# Patient Record
Sex: Female | Born: 1965
Health system: Southern US, Community
[De-identification: ages and names within clinical notes are randomized; demographics above are authoritative.]

## PROBLEM LIST (undated history)

## (undated) DIAGNOSIS — Z8669 Personal history of other diseases of the nervous system and sense organs: Secondary | ICD-10-CM

## (undated) DIAGNOSIS — N760 Acute vaginitis: Secondary | ICD-10-CM

## (undated) DIAGNOSIS — E0590 Thyrotoxicosis, unspecified without thyrotoxic crisis or storm: Secondary | ICD-10-CM

## (undated) DIAGNOSIS — R0689 Other abnormalities of breathing: Secondary | ICD-10-CM

## (undated) DIAGNOSIS — K219 Gastro-esophageal reflux disease without esophagitis: Secondary | ICD-10-CM

## (undated) DIAGNOSIS — D649 Anemia, unspecified: Secondary | ICD-10-CM

## (undated) DIAGNOSIS — Z9109 Other allergy status, other than to drugs and biological substances: Secondary | ICD-10-CM

## (undated) DIAGNOSIS — N644 Mastodynia: Secondary | ICD-10-CM

## (undated) DIAGNOSIS — F419 Anxiety disorder, unspecified: Secondary | ICD-10-CM

## (undated) DIAGNOSIS — K449 Diaphragmatic hernia without obstruction or gangrene: Secondary | ICD-10-CM

## (undated) HISTORY — DX: Other abnormalities of breathing: R06.89

## (undated) HISTORY — DX: Gastro-esophageal reflux disease without esophagitis: K21.9

## (undated) HISTORY — DX: Acute vaginitis: N76.0

## (undated) HISTORY — DX: Anxiety disorder, unspecified: F41.9

## (undated) HISTORY — DX: Other allergy status, other than to drugs and biological substances: Z91.09

## (undated) HISTORY — DX: Anemia, unspecified: D64.9

## (undated) HISTORY — DX: Thyrotoxicosis, unspecified without thyrotoxic crisis or storm: E05.90

## (undated) HISTORY — DX: Mastodynia: N64.4

## (undated) HISTORY — DX: Personal history of other diseases of the nervous system and sense organs: Z86.69

## (undated) HISTORY — DX: Diaphragmatic hernia without obstruction or gangrene: K44.9

## (undated) HISTORY — PX: TOOTH EXTRACTION: SUR596

---

## 1988-05-27 HISTORY — PX: SEPTOPLASTY: SUR1290

## 1991-11-28 HISTORY — PX: NASAL SINUS SURGERY: SHX719

## 1998-02-27 HISTORY — PX: UMBILICAL HERNIA REPAIR: SHX196

## 1998-02-27 HISTORY — PX: TUBAL LIGATION: SHX77

## 1998-03-23 ENCOUNTER — Ambulatory Visit (HOSPITAL_COMMUNITY): Admission: RE | Admit: 1998-03-23 | Discharge: 1998-03-23 | Payer: Self-pay | Admitting: *Deleted

## 1998-11-30 ENCOUNTER — Other Ambulatory Visit: Admission: RE | Admit: 1998-11-30 | Discharge: 1998-11-30 | Payer: Self-pay | Admitting: *Deleted

## 1999-12-09 ENCOUNTER — Other Ambulatory Visit: Admission: RE | Admit: 1999-12-09 | Discharge: 1999-12-09 | Payer: Self-pay | Admitting: *Deleted

## 2003-11-20 ENCOUNTER — Ambulatory Visit: Payer: Self-pay | Admitting: Internal Medicine

## 2003-11-24 ENCOUNTER — Ambulatory Visit: Payer: Self-pay | Admitting: Internal Medicine

## 2003-11-28 HISTORY — PX: CHOLECYSTECTOMY: SHX55

## 2003-12-18 ENCOUNTER — Ambulatory Visit: Payer: Self-pay | Admitting: Surgery

## 2004-01-25 ENCOUNTER — Ambulatory Visit: Payer: Self-pay | Admitting: Surgery

## 2004-02-07 ENCOUNTER — Ambulatory Visit: Payer: Self-pay | Admitting: Surgery

## 2004-03-06 ENCOUNTER — Ambulatory Visit: Payer: Self-pay | Admitting: Internal Medicine

## 2004-03-21 ENCOUNTER — Ambulatory Visit: Payer: Self-pay | Admitting: Gastroenterology

## 2005-03-06 ENCOUNTER — Ambulatory Visit: Payer: Self-pay | Admitting: Internal Medicine

## 2006-04-22 ENCOUNTER — Ambulatory Visit: Payer: Self-pay | Admitting: Internal Medicine

## 2006-07-14 ENCOUNTER — Ambulatory Visit: Payer: Self-pay | Admitting: Gastroenterology

## 2006-08-26 ENCOUNTER — Ambulatory Visit: Payer: Self-pay | Admitting: Internal Medicine

## 2007-04-29 ENCOUNTER — Ambulatory Visit: Payer: Self-pay | Admitting: Internal Medicine

## 2008-05-02 ENCOUNTER — Ambulatory Visit: Payer: Self-pay | Admitting: Internal Medicine

## 2008-06-19 ENCOUNTER — Ambulatory Visit: Payer: Self-pay | Admitting: Internal Medicine

## 2009-01-13 ENCOUNTER — Ambulatory Visit: Payer: Self-pay

## 2009-01-29 ENCOUNTER — Ambulatory Visit: Payer: Self-pay

## 2009-05-07 ENCOUNTER — Ambulatory Visit: Payer: Self-pay | Admitting: Internal Medicine

## 2009-12-03 ENCOUNTER — Ambulatory Visit: Payer: Self-pay | Admitting: Internal Medicine

## 2010-04-10 ENCOUNTER — Ambulatory Visit: Payer: Self-pay | Admitting: Pain Medicine

## 2010-04-18 ENCOUNTER — Ambulatory Visit: Payer: Self-pay | Admitting: Pain Medicine

## 2010-04-22 ENCOUNTER — Ambulatory Visit: Payer: Self-pay | Admitting: Pain Medicine

## 2010-04-25 ENCOUNTER — Ambulatory Visit: Payer: Self-pay | Admitting: Pain Medicine

## 2010-05-13 ENCOUNTER — Ambulatory Visit: Payer: Self-pay | Admitting: Internal Medicine

## 2010-05-13 ENCOUNTER — Ambulatory Visit: Payer: Self-pay | Admitting: Pain Medicine

## 2010-05-21 ENCOUNTER — Ambulatory Visit: Payer: Self-pay | Admitting: Pain Medicine

## 2010-06-03 ENCOUNTER — Ambulatory Visit: Payer: Self-pay | Admitting: Pain Medicine

## 2010-06-04 ENCOUNTER — Ambulatory Visit: Payer: Self-pay | Admitting: Pain Medicine

## 2010-06-17 ENCOUNTER — Ambulatory Visit: Payer: Self-pay | Admitting: Pain Medicine

## 2010-10-03 ENCOUNTER — Ambulatory Visit: Payer: Self-pay | Admitting: Pain Medicine

## 2010-10-23 ENCOUNTER — Ambulatory Visit: Payer: Self-pay | Admitting: Pain Medicine

## 2010-10-31 ENCOUNTER — Ambulatory Visit: Payer: Self-pay | Admitting: Pain Medicine

## 2010-11-18 ENCOUNTER — Ambulatory Visit: Payer: Self-pay | Admitting: Pain Medicine

## 2010-12-12 ENCOUNTER — Ambulatory Visit: Payer: Self-pay | Admitting: Pain Medicine

## 2010-12-30 ENCOUNTER — Ambulatory Visit: Payer: Self-pay | Admitting: Pain Medicine

## 2011-01-02 ENCOUNTER — Ambulatory Visit: Payer: Self-pay | Admitting: Pain Medicine

## 2011-01-15 ENCOUNTER — Ambulatory Visit: Payer: Self-pay | Admitting: Pain Medicine

## 2011-01-30 LAB — HM PAP SMEAR

## 2011-04-30 LAB — HM MAMMOGRAPHY

## 2011-05-05 ENCOUNTER — Ambulatory Visit: Payer: Self-pay | Admitting: Pain Medicine

## 2011-05-08 ENCOUNTER — Ambulatory Visit: Payer: Self-pay | Admitting: Pain Medicine

## 2011-05-28 ENCOUNTER — Ambulatory Visit: Payer: Self-pay | Admitting: Pain Medicine

## 2011-05-29 ENCOUNTER — Ambulatory Visit: Payer: Self-pay | Admitting: Pain Medicine

## 2011-06-17 ENCOUNTER — Ambulatory Visit: Payer: Self-pay | Admitting: Pain Medicine

## 2011-06-17 ENCOUNTER — Ambulatory Visit: Payer: Self-pay | Admitting: Internal Medicine

## 2011-06-26 ENCOUNTER — Ambulatory Visit: Payer: Self-pay | Admitting: Pain Medicine

## 2011-07-15 ENCOUNTER — Ambulatory Visit: Payer: Self-pay | Admitting: Pain Medicine

## 2012-01-30 ENCOUNTER — Encounter: Payer: Self-pay | Admitting: Internal Medicine

## 2012-01-30 ENCOUNTER — Ambulatory Visit (INDEPENDENT_AMBULATORY_CARE_PROVIDER_SITE_OTHER): Payer: BC Managed Care – PPO | Admitting: Internal Medicine

## 2012-01-30 VITALS — BP 120/70 | HR 80 | Temp 98.6°F | Ht 64.0 in | Wt 133.2 lb

## 2012-01-30 DIAGNOSIS — K219 Gastro-esophageal reflux disease without esophagitis: Secondary | ICD-10-CM | POA: Insufficient documentation

## 2012-01-30 DIAGNOSIS — R5381 Other malaise: Secondary | ICD-10-CM

## 2012-01-30 DIAGNOSIS — D649 Anemia, unspecified: Secondary | ICD-10-CM

## 2012-01-30 DIAGNOSIS — E0590 Thyrotoxicosis, unspecified without thyrotoxic crisis or storm: Secondary | ICD-10-CM

## 2012-01-30 DIAGNOSIS — Z139 Encounter for screening, unspecified: Secondary | ICD-10-CM

## 2012-01-30 DIAGNOSIS — R5383 Other fatigue: Secondary | ICD-10-CM

## 2012-01-30 MED ORDER — ALPRAZOLAM 0.25 MG PO TABS: 0.2500 mg | ORAL_TABLET | Freq: Every evening | ORAL | Status: DC | PRN

## 2012-01-30 MED ORDER — TANDEM PLUS 162-115.2-1 MG PO CAPS: 1.0000 | ORAL_CAPSULE | Freq: Every day | ORAL | Status: DC

## 2012-02-01 ENCOUNTER — Encounter: Payer: Self-pay | Admitting: Internal Medicine

## 2012-02-01 DIAGNOSIS — D649 Anemia, unspecified: Secondary | ICD-10-CM | POA: Insufficient documentation

## 2012-02-01 NOTE — Assessment & Plan Note (Signed)
On Omeprazole.  Symptoms controlled.    

## 2012-02-01 NOTE — Progress Notes (Signed)
  Subjective:    Patient ID: Shannon [name], female    DOB: 01-26-1966, 47 y.o.   MRN: [id number]  HPI 47 year old female with past history of reoccurring allergy and sinus problems as well as hyperthyroidism s/p ablation who comes in today for a scheduled follow up.  She states she saw Dr Feliberto Gottron.  Was started on Combipatch.  Has had three periods since Thanksgiving.  Has felt better on the patch.  Mood swings better. Has been a little more emotional.  Headaches better.  She is scheduled to be seen in the headache clinic soon.  Sees them yearly.  She did see Dr Tedd Sias in 10/13.  States her thyroid checked out fine.  Wants to start getting it checked here.  Blood pressure has been doing well.    Past Medical History  Diagnosis Date  . Hyperthyroidism     s/p ablation  . GERD (gastroesophageal reflux disease)   . History of migraine headaches   . Anemia   . Environmental allergies   . Hiatal hernia     small    Current Outpatient Prescriptions on File Prior to Visit  Medication Sig Dispense Refill  . estradiol-norethindrone (COMBIPATCH) 0.05-0.14 MG/DAY Place 1 patch onto the skin 2 (two) times a week.      Marland Kitchen omeprazole (PRILOSEC) 20 MG capsule Take 20 mg by mouth daily.        Review of Systems Patient denies any lightheadedness or dizziness.  Headaches better.  No chest pain, tightness or palpitations.  No increased shortness of breath, cough or congestion.  No acid reflux.  Controlled.  No nausea or vomiting.  No abdominal pain or cramping.  No bowel change, such as diarrhea, constipation, BRBPR or melana.  No urine change.  Periods as outlined.       Objective:   Physical Exam Filed Vitals:   01/30/12 0825  BP: 120/70  Pulse: 80  Temp: 98.6 F (58 C)   47 year old female in no acute distress.   HEENT:  Nares - clear.  OP- without lesions or erythema.  NECK:  Supple, nontender.  No audible bruit.   HEART:  Appears to be regular. LUNGS:  Without crackles or  wheezing audible.  Respirations even and unlabored.   RADIAL PULSE:  Equal bilaterally.  ABDOMEN:  Soft, nontender.  No audible abdominal bruit.   EXTREMITIES:  No increased edema to be present.                  Assessment & Plan:  GYN.  States she is up to date with her pelvic and pap smears.  On Combi patch now.  Plans to discuss the menstrual change with GYN.    CARDIOVASCULAR.  ECHO 12/05/09 revealed EF 60% with no significant valve disease.  Currently asymptomatic.   ELEVATED BLOOD PRESSURE.  Previously noted.  Looks good today.  Follow.   INCREASED ANXIETY.  Takes xanax (1/2 tablet q hs).  Follow.    HEALTH MAINTENANCE.  GYN exams through GYN.  Colonoscopy 02/26/11 normal.  Recommended follow up colonoscopy 3-5 years.  Mammogram 06/17/11 - BiRADS II.

## 2012-02-01 NOTE — Assessment & Plan Note (Signed)
On iron supplements.  Check cbc and ferritin with next labs.

## 2012-02-01 NOTE — Assessment & Plan Note (Signed)
S/p ablation.  Follow tsh.  Obtain records from Dr Tedd Sias.

## 2012-02-23 ENCOUNTER — Ambulatory Visit: Payer: Self-pay | Admitting: Pain Medicine

## 2012-03-02 ENCOUNTER — Ambulatory Visit: Payer: Self-pay | Admitting: Pain Medicine

## 2012-03-17 ENCOUNTER — Ambulatory Visit: Payer: Self-pay | Admitting: Pain Medicine

## 2012-03-23 ENCOUNTER — Ambulatory Visit: Payer: Self-pay | Admitting: Pain Medicine

## 2012-04-07 ENCOUNTER — Ambulatory Visit: Payer: Self-pay | Admitting: Pain Medicine

## 2012-05-04 ENCOUNTER — Ambulatory Visit: Payer: Self-pay | Admitting: Pain Medicine

## 2012-05-13 ENCOUNTER — Other Ambulatory Visit (INDEPENDENT_AMBULATORY_CARE_PROVIDER_SITE_OTHER): Payer: BC Managed Care – PPO

## 2012-05-13 DIAGNOSIS — Z139 Encounter for screening, unspecified: Secondary | ICD-10-CM

## 2012-05-13 DIAGNOSIS — R5383 Other fatigue: Secondary | ICD-10-CM

## 2012-05-13 DIAGNOSIS — D649 Anemia, unspecified: Secondary | ICD-10-CM

## 2012-05-13 DIAGNOSIS — K219 Gastro-esophageal reflux disease without esophagitis: Secondary | ICD-10-CM

## 2012-05-13 DIAGNOSIS — R5381 Other malaise: Secondary | ICD-10-CM

## 2012-05-13 DIAGNOSIS — E0590 Thyrotoxicosis, unspecified without thyrotoxic crisis or storm: Secondary | ICD-10-CM

## 2012-05-13 LAB — CBC WITH DIFFERENTIAL/PLATELET
Basophils Relative: 0.5 % (ref 0.0–3.0)
Eosinophils Relative: 0.6 % (ref 0.0–5.0)
Lymphocytes Relative: 20.3 % (ref 12.0–46.0)
MCV: 91.7 fl (ref 78.0–100.0)
Monocytes Relative: 8.3 % (ref 3.0–12.0)
Neutrophils Relative %: 70.3 % (ref 43.0–77.0)
Platelets: 277 10*3/uL (ref 150.0–400.0)
RBC: 4.42 Mil/uL (ref 3.87–5.11)
WBC: 8.7 10*3/uL (ref 4.5–10.5)

## 2012-05-13 LAB — FERRITIN: Ferritin: 185.1 ng/mL (ref 10.0–291.0)

## 2012-05-13 LAB — LIPID PANEL
Cholesterol: 174 mg/dL (ref 0–200)
HDL: 57.4 mg/dL (ref >39.00)
LDL Cholesterol: 104 mg/dL — ABNORMAL HIGH (ref 0–99)
Total CHOL/HDL Ratio: 3
Triglycerides: 62 mg/dL (ref 0.0–149.0)

## 2012-05-13 LAB — COMPREHENSIVE METABOLIC PANEL
ALT: 16 U/L (ref 0–35)
AST: 21 U/L (ref 0–37)
Creatinine, Ser: 0.8 mg/dL (ref 0.4–1.2)
Total Bilirubin: 0.8 mg/dL (ref 0.3–1.2)

## 2012-05-20 ENCOUNTER — Encounter: Payer: Self-pay | Admitting: Internal Medicine

## 2012-05-20 ENCOUNTER — Ambulatory Visit (INDEPENDENT_AMBULATORY_CARE_PROVIDER_SITE_OTHER): Payer: BC Managed Care – PPO | Admitting: Internal Medicine

## 2012-05-20 VITALS — BP 110/70 | HR 85 | Temp 99.0°F | Ht 63.25 in | Wt 130.8 lb

## 2012-05-20 DIAGNOSIS — G43909 Migraine, unspecified, not intractable, without status migrainosus: Secondary | ICD-10-CM

## 2012-05-20 DIAGNOSIS — E0590 Thyrotoxicosis, unspecified without thyrotoxic crisis or storm: Secondary | ICD-10-CM

## 2012-05-20 DIAGNOSIS — Z1239 Encounter for other screening for malignant neoplasm of breast: Secondary | ICD-10-CM

## 2012-05-20 DIAGNOSIS — K219 Gastro-esophageal reflux disease without esophagitis: Secondary | ICD-10-CM

## 2012-05-20 DIAGNOSIS — D649 Anemia, unspecified: Secondary | ICD-10-CM

## 2012-05-20 MED ORDER — ALPRAZOLAM 0.25 MG PO TABS: 0.2500 mg | ORAL_TABLET | Freq: Every evening | ORAL | Status: DC | PRN

## 2012-05-21 ENCOUNTER — Encounter: Payer: Self-pay | Admitting: Internal Medicine

## 2012-05-21 DIAGNOSIS — G43909 Migraine, unspecified, not intractable, without status migrainosus: Secondary | ICD-10-CM | POA: Insufficient documentation

## 2012-05-21 NOTE — Progress Notes (Signed)
Subjective:    Patient ID: Shannon [name], female    DOB: 1965/07/23, 47 y.o.   MRN: [id number]  HPI 47 year old female with past history of reoccurring allergy and sinus problems as well as hyperthyroidism s/p ablation who comes in today to follow up on these issues as well as for a complete physical exam.  She states she saw Dr Feliberto Gottron.  Is on Combipatch.  Periods persistent and occasionally lasting 9-13 days.  (11/4 - 9 days, 11/30 - 5 days, 12/9 - 6 days, 12/19 - 10 days, 1/18 - 5 days, 2/20 -  5days, 3/10 - 13 days, 3/25 - 9 days.  Started her last period 05/18/12.  Still having some hot flashes, but seem to be some better.  Still some migraines.  [facility]   She did see Dr Tedd Sias in 10/13.  States her thyroid checked out fine.  Wants to start getting it checked here.  Recent thyroid function wnl.  Blood pressure has been doing well.     Past Medical History  Diagnosis Date  . Hyperthyroidism     s/p ablation  . GERD (gastroesophageal reflux disease)   . History of migraine headaches   . Anemia   . Environmental allergies   . Hiatal hernia     small    Current Outpatient Prescriptions on File Prior to Visit  Medication Sig Dispense Refill  . estradiol-norethindrone (COMBIPATCH) 0.05-0.14 MG/DAY Place 1 patch onto the skin 2 (two) times a week.      . FeFum-FePo-FA-B Cmp-C-Zn-Mn-Cu (TANDEM PLUS) 162-115.2-1 MG CAPS Take 1 capsule by mouth daily.  30 each  6  . omeprazole (PRILOSEC) 20 MG capsule Take 20 mg by mouth daily.       No current facility-administered medications on file prior to visit.    Review of Systems Patient denies any lightheadedness or dizziness.  Headaches better.  Still some migraines.  No chest pain, tightness or palpitations.  No increased shortness of breath, cough or congestion.  No acid reflux.  Controlled.  No nausea or vomiting.  No abdominal pain or cramping.  No bowel change, such as diarrhea, constipation, BRBPR or  melana.  No urine change.  Periods as outlined.  Some hot flashes.  Some better.  Back better since recent injection.       Objective:   Physical Exam  Filed Vitals:   05/20/12 1043  BP: 110/70  Pulse: 85  Temp: 99 F (37.2 C)   Blood pressure recheck:  122/80, pulse 18  47 year old female in no acute distress.   HEENT:  Nares- clear.  Oropharynx - without lesions. NECK:  Supple.  Nontender.  No audible bruit.  HEART:  Appears to be regular. LUNGS:  No crackles or wheezing audible.  Respirations even and unlabored.  RADIAL PULSE:  Equal bilaterally.    BREASTS:  No nipple discharge or nipple retraction present.  Could not appreciate any distinct nodules or axillary adenopathy.  ABDOMEN:  Soft, nontender.  Bowel sounds present and normal.  No audible abdominal bruit.  GU:  Unable to do secondary to her having her period.  EXTREMITIES:  No increased edema present.  DP pulses palpable and equal bilaterally.          Assessment & Plan:  GYN.  Last pelvic/pap 1/13.  Unable to do today secondary to her having her period.  On Combi patch now.  Discussed the need to follow up with GYN regarding the  persistent increased periods.  Will get her back in soon to perform her pelvic/pap.    CARDIOVASCULAR.  ECHO 12/05/09 revealed EF 60% with no significant valve disease.  Currently asymptomatic.   ELEVATED BLOOD PRESSURE.  Previously noted.  Looks good today.  Follow.   INCREASED ANXIETY.  Takes xanax (1/2 tablet q hs).  Follow.    HEALTH MAINTENANCE.  Physical today.  Get her back in soon for her pelvic/pap.  Colonoscopy 02/26/11 normal.  Recommended follow up colonoscopy 3-5 years.  Mammogram 06/17/11 - BiRADS II. Schedule a follow up mammogram.

## 2012-05-21 NOTE — Assessment & Plan Note (Signed)
On iron supplements.  Taking every other day now.  Recent hgb and ferritin wnl.

## 2012-05-21 NOTE — Assessment & Plan Note (Signed)
On Omeprazole.  Symptoms controlled.    

## 2012-05-21 NOTE — Assessment & Plan Note (Signed)
Still with intermittent headaches.  [facility]

## 2012-05-21 NOTE — Assessment & Plan Note (Signed)
S/p ablation.  Follow tsh.  Recent check wnl.

## 2012-05-26 ENCOUNTER — Ambulatory Visit: Payer: Self-pay | Admitting: Pain Medicine

## 2012-05-27 ENCOUNTER — Encounter: Payer: Self-pay | Admitting: Emergency Medicine

## 2012-06-25 ENCOUNTER — Ambulatory Visit: Payer: Self-pay | Admitting: Internal Medicine

## 2012-07-20 ENCOUNTER — Ambulatory Visit: Payer: BC Managed Care – PPO | Admitting: Internal Medicine

## 2012-07-22 ENCOUNTER — Encounter: Payer: Self-pay | Admitting: Internal Medicine

## 2012-08-04 ENCOUNTER — Other Ambulatory Visit (HOSPITAL_COMMUNITY)
Admission: RE | Admit: 2012-08-04 | Discharge: 2012-08-04 | Disposition: A | Discharge status: Home / Self Care | Payer: BC Managed Care – PPO | Source: Ambulatory Visit | Attending: Internal Medicine | Admitting: Internal Medicine

## 2012-08-04 ENCOUNTER — Encounter: Payer: Self-pay | Admitting: Internal Medicine

## 2012-08-04 ENCOUNTER — Ambulatory Visit (INDEPENDENT_AMBULATORY_CARE_PROVIDER_SITE_OTHER): Payer: BC Managed Care – PPO | Admitting: Internal Medicine

## 2012-08-04 VITALS — BP 110/80 | HR 76 | Temp 98.3°F | Ht 63.25 in | Wt 131.0 lb

## 2012-08-04 DIAGNOSIS — Z124 Encounter for screening for malignant neoplasm of cervix: Secondary | ICD-10-CM

## 2012-08-04 DIAGNOSIS — D649 Anemia, unspecified: Secondary | ICD-10-CM

## 2012-08-04 DIAGNOSIS — Z1151 Encounter for screening for human papillomavirus (HPV): Secondary | ICD-10-CM | POA: Insufficient documentation

## 2012-08-04 DIAGNOSIS — E0590 Thyrotoxicosis, unspecified without thyrotoxic crisis or storm: Secondary | ICD-10-CM

## 2012-08-04 DIAGNOSIS — Z01419 Encounter for gynecological examination (general) (routine) without abnormal findings: Secondary | ICD-10-CM | POA: Insufficient documentation

## 2012-08-04 DIAGNOSIS — K219 Gastro-esophageal reflux disease without esophagitis: Secondary | ICD-10-CM

## 2012-08-04 DIAGNOSIS — G43909 Migraine, unspecified, not intractable, without status migrainosus: Secondary | ICD-10-CM

## 2012-08-07 ENCOUNTER — Encounter: Payer: Self-pay | Admitting: Internal Medicine

## 2012-08-07 NOTE — Assessment & Plan Note (Signed)
On Omeprazole.  Symptoms controlled.    

## 2012-08-07 NOTE — Assessment & Plan Note (Signed)
No reported problems with headaches today.  Follow[facility].

## 2012-08-07 NOTE — Progress Notes (Signed)
Subjective:    Patient ID: Shannon [name], female    DOB: 05-25-1965, 47 y.o.   MRN: [id number]  HPI 47 year old female with past history of reoccurring allergy and sinus problems as well as hyperthyroidism s/p ablation who comes in today to follow up on these issues as well as for a complete physical exam.  She states she saw Dr Feliberto Gottron.  Is on Combipatch.  Periods persistent and occasionally lasting 8-10 days.  (4/22 - 8 days, 5/16 - 10 days, 5/29 - 6 days, 6/24 - 4 days, 7/5 - spotting.  Still having some hot flashes.  Does not feel the combipatch has helped her.  Is concerned that she is on estrogen with the history of her mother's breast cancer.   She has been seeing Dr Tedd Sias.   States her thyroid has checked out fine.   Recent thyroid function wnl.  Blood pressure has been doing well.  No acid reflux.  Bowels stable.     Past Medical History  Diagnosis Date  . Hyperthyroidism     s/p ablation  . GERD (gastroesophageal reflux disease)   . History of migraine headaches   . Anemia   . Environmental allergies   . Hiatal hernia     small    Current Outpatient Prescriptions on File Prior to Visit  Medication Sig Dispense Refill  . ALPRAZolam (XANAX) 0.25 MG tablet Take 1 tablet (0.25 mg total) by mouth at bedtime as needed.  30 tablet  1  . estradiol-norethindrone (COMBIPATCH) 0.05-0.14 MG/DAY Place 1 patch onto the skin 2 (two) times a week.      . FeFum-FePo-FA-B Cmp-C-Zn-Mn-Cu (TANDEM PLUS) 162-115.2-1 MG CAPS Take 1 capsule by mouth daily.  30 each  6  . omeprazole (PRILOSEC) 20 MG capsule Take 20 mg by mouth daily.       No current facility-administered medications on file prior to visit.    Review of Systems Patient denies any lightheadedness or dizziness.  No report of problems with headaches today.   No chest pain, tightness or palpitations.  No increased shortness of breath, cough or congestion.  No acid reflux.  Controlled.  No nausea or vomiting.  No abdominal  pain or cramping.  No bowel change, such as diarrhea, constipation, BRBPR or melana.  No urine change.  Periods as outlined.  Some hot flashes.  Combipatch not helping.        Objective:   Physical Exam  Filed Vitals:   08/04/12 1508  BP: 110/80  Pulse: 76  Temp: 98.3 F (36.8 C)   Pulse recheck:  27  47 year old female in no acute distress.   HEENT:  Nares- clear.  Oropharynx - without lesions. NECK:  Supple.  Nontender.  No audible bruit.  HEART:  Appears to be regular. LUNGS:  No crackles or wheezing audible.  Respirations even and unlabored.  RADIAL PULSE:  Equal bilaterally.    BREASTS:  No nipple discharge or nipple retraction present.  Could not appreciate any distinct nodules or axillary adenopathy.  ABDOMEN:  Soft, nontender.  Bowel sounds present and normal.  No audible abdominal bruit.  GU:  Normal external genitalia.  Vaginal vault without lesions.  Cervix identified.  Pap performed. Could not appreciate any adnexal masses or tenderness.   EXTREMITIES:  No increased edema present.  DP pulses palpable and equal bilaterally.          Assessment & Plan:  GYN.  Pelvic/pap today.  Periods as outlined.  Combipatch not helping.  Will taper off.  Discussed with her regarding the need to f/u with gyn to confirm what would be the next step with treatment for her periods and symptoms.     CARDIOVASCULAR.  ECHO 12/05/09 revealed EF 60% with no significant valve disease.  Currently asymptomatic.   ELEVATED BLOOD PRESSURE.  Previously noted.  Looks good today.  Follow.   INCREASED ANXIETY.  Takes xanax (1/2 tablet q hs).  Follow.    HEALTH MAINTENANCE.  Physical (pelvic/pap today) today.  Colonoscopy 02/26/11 normal.  Recommended follow up colonoscopy 3-5 years.  Mammogram 06/25/12.

## 2012-08-07 NOTE — Assessment & Plan Note (Signed)
S/p ablation.  Follow tsh.  Obtain records from Dr Tedd Sias.

## 2012-08-07 NOTE — Assessment & Plan Note (Signed)
Recent hgb and ferritin wnl.  Recheck to confirm stable.

## 2012-08-10 ENCOUNTER — Encounter: Payer: Self-pay | Admitting: Internal Medicine

## 2012-08-12 ENCOUNTER — Encounter: Payer: Self-pay | Admitting: Internal Medicine

## 2012-08-20 ENCOUNTER — Ambulatory Visit: Payer: BC Managed Care – PPO | Admitting: Internal Medicine

## 2012-08-24 ENCOUNTER — Encounter: Payer: Self-pay | Admitting: Internal Medicine

## 2012-08-25 NOTE — Telephone Encounter (Signed)
FYI

## 2012-09-07 ENCOUNTER — Ambulatory Visit: Payer: BC Managed Care – PPO | Admitting: Internal Medicine

## 2012-09-22 ENCOUNTER — Encounter: Payer: Self-pay | Admitting: Internal Medicine

## 2012-09-23 ENCOUNTER — Encounter: Payer: Self-pay | Admitting: *Deleted

## 2012-09-23 ENCOUNTER — Telehealth: Payer: Self-pay | Admitting: Internal Medicine

## 2012-09-23 MED ORDER — ALPRAZOLAM 0.25 MG PO TABS: ORAL_TABLET | ORAL | Status: DC

## 2012-09-23 NOTE — Telephone Encounter (Signed)
Prescription for xanax #30 with one refill ok'd.

## 2012-10-28 ENCOUNTER — Ambulatory Visit: Payer: Self-pay | Admitting: Pain Medicine

## 2012-11-04 ENCOUNTER — Encounter: Payer: Self-pay | Admitting: Internal Medicine

## 2012-11-04 ENCOUNTER — Ambulatory Visit (INDEPENDENT_AMBULATORY_CARE_PROVIDER_SITE_OTHER): Payer: BC Managed Care – PPO | Admitting: Internal Medicine

## 2012-11-04 VITALS — BP 130/80 | HR 72 | Temp 98.4°F | Ht 63.25 in | Wt 131.5 lb

## 2012-11-04 DIAGNOSIS — N644 Mastodynia: Secondary | ICD-10-CM

## 2012-11-04 DIAGNOSIS — G43909 Migraine, unspecified, not intractable, without status migrainosus: Secondary | ICD-10-CM

## 2012-11-04 DIAGNOSIS — N951 Menopausal and female climacteric states: Secondary | ICD-10-CM

## 2012-11-04 DIAGNOSIS — K219 Gastro-esophageal reflux disease without esophagitis: Secondary | ICD-10-CM

## 2012-11-04 DIAGNOSIS — E0590 Thyrotoxicosis, unspecified without thyrotoxic crisis or storm: Secondary | ICD-10-CM

## 2012-11-04 DIAGNOSIS — D649 Anemia, unspecified: Secondary | ICD-10-CM

## 2012-11-07 ENCOUNTER — Encounter: Payer: Self-pay | Admitting: Internal Medicine

## 2012-11-07 DIAGNOSIS — N644 Mastodynia: Secondary | ICD-10-CM

## 2012-11-07 DIAGNOSIS — N951 Menopausal and female climacteric states: Secondary | ICD-10-CM | POA: Insufficient documentation

## 2012-11-07 HISTORY — DX: Mastodynia: N64.4

## 2012-11-07 NOTE — Assessment & Plan Note (Signed)
Recent hgb and ferritin wnl.  Follow.   

## 2012-11-07 NOTE — Assessment & Plan Note (Signed)
S/p ablation.  Follow tsh.  

## 2012-11-07 NOTE — Assessment & Plan Note (Signed)
Some hot flashes.  Not sleeping well.  Uses xanax to help her sleep.  Discussed further intervention.  She desires no further intervention.  Follow.

## 2012-11-07 NOTE — Assessment & Plan Note (Signed)
Headaches better since being off hormones.   Follow[facility].   

## 2012-11-07 NOTE — Assessment & Plan Note (Signed)
On Omeprazole.  Symptoms controlled.    

## 2012-11-07 NOTE — Assessment & Plan Note (Signed)
Burning - right breast as outlined.  Exam as outlined.  Obtain right breast mammogram and ultrasound.  Further w/up pending results.

## 2012-11-07 NOTE — Progress Notes (Signed)
Subjective:    Patient ID: Shannon [name], female    DOB: 04-03-1965, 47 y.o.   MRN: [id number]  HPI 47 year old female with past history of reoccurring allergy and sinus problems as well as hyperthyroidism s/p ablation who comes in today to follow for a scheduled follow up.  She has been seeing Dr Feliberto Gottron.  Was on Combipatch.  She stopped this after the last visit.  No further bleeding.   Still having hot flashes. Not sleeping well.  Taking xanax every night now.   No headaches.  She has been seeing Dr Tedd Sias.   States her thyroid has checked out fine.   Recent thyroid function wnl.  Blood pressure has been doing well.  No acid reflux.  Bowels stable.  She does report some burning in her right breast.  Some nipple inversion.      Past Medical History  Diagnosis Date  . Hyperthyroidism     s/p ablation  . GERD (gastroesophageal reflux disease)   . History of migraine headaches   . Anemia   . Environmental allergies   . Hiatal hernia     small    Current Outpatient Prescriptions on File Prior to Visit  Medication Sig Dispense Refill  . ALPRAZolam (XANAX) 0.25 MG tablet 1/2 - 1 tablet q hs prn  30 tablet  1  . FeFum-FePo-FA-B Cmp-C-Zn-Mn-Cu (TANDEM PLUS) 162-115.2-1 MG CAPS Take 1 capsule by mouth daily.  30 each  6  . omeprazole (PRILOSEC) 20 MG capsule Take 20 mg by mouth daily.       No current facility-administered medications on file prior to visit.    Review of Systems Patient denies any lightheadedness or dizziness.  Headaches better since stopping the combipatch. No chest pain, tightness or palpitations.  No increased shortness of breath, cough or congestion.  No acid reflux.  Controlled.  No nausea or vomiting.  No abdominal pain or cramping.  No bowel change, such as diarrhea, constipation, BRBPR or melana.  No urine change.  No periods.   Some hot flashes.          Objective:   Physical Exam  Filed Vitals:   11/04/12 0902  BP: 130/80  Pulse: 72  Temp:  98.4 F (36.9 C)   Blood pressure recheck:  42/51-66  47 year old female in no acute distress.   HEENT:  Nares- clear.  Oropharynx - without lesions. NECK:  Supple.  Nontender.  No audible bruit.  HEART:  Appears to be regular. LUNGS:  No crackles or wheezing audible.  Respirations even and unlabored.  RADIAL PULSE:  Equal bilaterally.   BREASTS:  No nipple discharge.  Nipple inversion - right breast.  Could not appreciate any distinct nodules or axillary adenopathy.     ABDOMEN:  Soft, nontender.  Bowel sounds present and normal.  No audible abdominal bruit.    EXTREMITIES:  No increased edema present.  DP pulses palpable and equal bilaterally.          Assessment & Plan:  GYN.  No further bleeding since stopping the patch.  Follow.     CARDIOVASCULAR.  ECHO 12/05/09 revealed EF 60% with no significant valve disease.  Currently asymptomatic.   ELEVATED BLOOD PRESSURE.  Previously noted.  Looks good today.  Follow.   INCREASED ANXIETY.  Takes xanax (1/2 tablet q hs).  Follow.    HEALTH MAINTENANCE.  Physical (pelvic/pap today) last visit.  Colonoscopy 02/26/11 normal.  Recommended follow up colonoscopy 3-5 years.  Mammogram 06/25/12.

## 2012-11-15 ENCOUNTER — Ambulatory Visit: Payer: Self-pay | Admitting: Pain Medicine

## 2012-11-16 ENCOUNTER — Ambulatory Visit: Payer: Self-pay | Admitting: Internal Medicine

## 2012-11-17 ENCOUNTER — Telehealth: Payer: Self-pay | Admitting: Internal Medicine

## 2012-11-17 NOTE — Telephone Encounter (Signed)
Pt notified of mammo results

## 2012-11-18 ENCOUNTER — Ambulatory Visit: Payer: Self-pay | Admitting: Pain Medicine

## 2012-12-06 ENCOUNTER — Ambulatory Visit: Payer: Self-pay | Admitting: Pain Medicine

## 2012-12-07 ENCOUNTER — Encounter: Payer: Self-pay | Admitting: Internal Medicine

## 2012-12-15 ENCOUNTER — Other Ambulatory Visit: Payer: Self-pay | Admitting: Internal Medicine

## 2012-12-15 NOTE — Telephone Encounter (Signed)
ok'd xanax #30 with 1 refill.

## 2012-12-15 NOTE — Telephone Encounter (Signed)
Last visit 11/04/12, refill? 

## 2012-12-16 ENCOUNTER — Encounter: Payer: Self-pay | Admitting: Internal Medicine

## 2013-01-10 ENCOUNTER — Telehealth: Payer: Self-pay | Admitting: Internal Medicine

## 2013-01-10 NOTE — Telephone Encounter (Signed)
FYI

## 2013-01-10 NOTE — Telephone Encounter (Signed)
Patient Information:  Caller Name: Wylie  Phone: [phone]  Patient: Shannon [name]  Gender: Female  DOB: 09-Nov-1965  Age: 47 Years  PCP: [facility] [place]  Pregnant: No  Office Follow Up:  Does the office need to follow up with this patient?: No  Instructions For The Office: N/A  RN Note:  Connected pt to appt line to schedule an appt within 3 days  Symptoms  Reason For Call & Symptoms: Pt calling about white vaginal discharge that is foul smelling on occas, and occas burning in vaginal area and on urination--has had sxs x 2 weeks.  Reviewed Health History In EMR: Yes  Reviewed Medications In EMR: Yes  Reviewed Allergies In EMR: Yes  Reviewed Surgeries / Procedures: Yes  Date of Onset of Symptoms: 12/27/2012  Treatments Tried: Monistat  Treatments Tried Worked: No OB / GYN:  LMP: Unknown  Guideline(s) Used:  Pregnancy - Vaginal Discharge  Vaginal Discharge  Disposition Per Guideline:   See Within 3 Days in Office  Reason For Disposition Reached:   Symptoms of a yeast infection" (i.e., itchy, white discharge, not bad smelling) and not improved > 3 days following Care Advice  Advice Given:  Call Back If:  You become worse.  Patient Will Follow Care Advice:  YES

## 2013-01-10 NOTE — Telephone Encounter (Signed)
Has an appt with me on 12/18.

## 2013-01-12 ENCOUNTER — Encounter: Payer: Self-pay | Admitting: *Deleted

## 2013-01-13 ENCOUNTER — Ambulatory Visit (INDEPENDENT_AMBULATORY_CARE_PROVIDER_SITE_OTHER): Payer: BC Managed Care – PPO | Admitting: Internal Medicine

## 2013-01-13 VITALS — BP 118/80 | HR 60 | Wt 134.0 lb

## 2013-01-13 DIAGNOSIS — Z139 Encounter for screening, unspecified: Secondary | ICD-10-CM

## 2013-01-13 DIAGNOSIS — N760 Acute vaginitis: Secondary | ICD-10-CM

## 2013-01-13 LAB — POCT URINALYSIS DIPSTICK
Bilirubin, UA: NEGATIVE
Blood, UA: NEGATIVE
Glucose, UA: NEGATIVE
Ketones, UA: NEGATIVE
Spec Grav, UA: 1.015

## 2013-01-13 NOTE — Progress Notes (Signed)
Pre visit review using our clinic review tool, if applicable. No additional management support is needed unless otherwise documented below in the visit note. 

## 2013-01-16 ENCOUNTER — Encounter: Payer: Self-pay | Admitting: Internal Medicine

## 2013-01-16 DIAGNOSIS — N760 Acute vaginitis: Secondary | ICD-10-CM

## 2013-01-16 HISTORY — DX: Acute vaginitis: N76.0

## 2013-01-16 NOTE — Assessment & Plan Note (Signed)
Persistent discharge and irritation.  KOH/wet prep obtained.  Await results.  Urinalysis negative.

## 2013-01-16 NOTE — Progress Notes (Signed)
  Subjective:    Patient ID: Shannon [name], female    DOB: 04/18/1965, 47 y.o.   MRN: [id number]  HPI 47 year old female with past history of reoccurring allergy and sinus problems as well as hyperthyroidism s/p ablation who comes in today as a work in with concerns regarding vaginal irritation.  She has had issues for the last two weeks.  Has noticed some discharge.  Has used monistat.  No relief.  Occasional burning and persistent intermittent irritation.  No vaginal bleeding since off hormones.  Has noticed some occasional burning when she urinates.  No abdominal pain or cramping.  No bowel change.      Past Medical History  Diagnosis Date  . Hyperthyroidism     s/p ablation  . GERD (gastroesophageal reflux disease)   . History of migraine headaches   . Anemia   . Environmental allergies   . Hiatal hernia     small    Current Outpatient Prescriptions on File Prior to Visit  Medication Sig Dispense Refill  . ALPRAZolam (XANAX) 0.25 MG tablet TAKE 1/2 TO 1 TABLET AT BEDTIME IF NEEDED  30 tablet  1  . FeFum-FePo-FA-B Cmp-C-Zn-Mn-Cu (TANDEM PLUS) 162-115.2-1 MG CAPS Take 1 capsule by mouth daily.  30 each  6  . omeprazole (PRILOSEC) 20 MG capsule Take 20 mg by mouth daily.       No current facility-administered medications on file prior to visit.    Review of Systems No fever.  No abdominal pain or cramping.  Vaginal irritation and discharge as outlined.  Some burning noticed with urination.  No bowel change.            Objective:   Physical Exam  Filed Vitals:   01/13/13 1209  BP: 118/80  Pulse: 2   47 year old female in no acute distress.   ABDOMEN:  Soft, nontender.  Bowel sounds present and normal.  BACK:  Non tender.  No CVA tenderness.  GU:  Normal external genitalia.  Vaginal vault without lesions.  Discharge present.  Could not appreciate any adnexal masses or tenderness.          Assessment & Plan:  GYN.  No further bleeding since stopping the patch.   Follow.     INCREASED ANXIETY.  Takes xanax (1/2 tablet q hs).  Follow.    HEALTH MAINTENANCE.  Physical 08/04/12.  Pap negative with negative HPV.  Colonoscopy 02/26/11 normal.  Recommended follow up colonoscopy 3-5 years.  Mammogram 06/25/12.

## 2013-01-18 ENCOUNTER — Encounter: Payer: Self-pay | Admitting: Internal Medicine

## 2013-01-18 LAB — WET PREP BY MOLECULAR PROBE
Candida species: NEGATIVE
Gardnerella vaginalis: NEGATIVE
Trichomonas vaginosis: NEGATIVE

## 2013-02-10 ENCOUNTER — Other Ambulatory Visit: Payer: Self-pay | Admitting: Internal Medicine

## 2013-02-11 ENCOUNTER — Encounter: Payer: Self-pay | Admitting: Internal Medicine

## 2013-02-17 ENCOUNTER — Ambulatory Visit (INDEPENDENT_AMBULATORY_CARE_PROVIDER_SITE_OTHER): Payer: BC Managed Care – PPO | Admitting: Internal Medicine

## 2013-02-17 ENCOUNTER — Encounter: Payer: Self-pay | Admitting: Internal Medicine

## 2013-02-17 VITALS — BP 120/80 | HR 72 | Temp 98.6°F | Ht 63.25 in | Wt 132.2 lb

## 2013-02-17 DIAGNOSIS — N951 Menopausal and female climacteric states: Secondary | ICD-10-CM

## 2013-02-17 DIAGNOSIS — K219 Gastro-esophageal reflux disease without esophagitis: Secondary | ICD-10-CM

## 2013-02-17 DIAGNOSIS — G43909 Migraine, unspecified, not intractable, without status migrainosus: Secondary | ICD-10-CM

## 2013-02-17 DIAGNOSIS — N760 Acute vaginitis: Secondary | ICD-10-CM

## 2013-02-17 DIAGNOSIS — D649 Anemia, unspecified: Secondary | ICD-10-CM

## 2013-02-17 DIAGNOSIS — E0590 Thyrotoxicosis, unspecified without thyrotoxic crisis or storm: Secondary | ICD-10-CM

## 2013-02-17 MED ORDER — ALPRAZOLAM 0.25 MG PO TABS: 0.2500 mg | ORAL_TABLET | Freq: Every evening | ORAL | Status: DC | PRN

## 2013-02-17 NOTE — Progress Notes (Signed)
Pre-visit discussion using our clinic review tool. No additional management support is needed unless otherwise documented below in the visit note.  

## 2013-02-17 NOTE — Progress Notes (Signed)
  Subjective:    Patient ID: Shannon [name], female    DOB: 11/03/1965, 48 y.o.   MRN: [id number]  HPI 48 year old female with past history of reoccurring allergy and sinus problems as well as hyperthyroidism s/p ablation who comes in today for a scheduled follow up.  The previous vaginal irritation has resolved.  States she had two slight headaches associated with some spotting.  When she stopped the hormones, had no bleeding for a while.  Stopped 7/14. States had some spotting last week for a few days.  Some minimal headache with the spotting.  Had one other episode that last 2-3 days.  No bleeding now.  Had some emesis and diarrhea beginning of the week.  Lasted six hours.  Took some zofran.  Is better now.  Is eating.  Still not back to normal, but feels better.  No headache now.    Past Medical History  Diagnosis Date  . Hyperthyroidism     s/p ablation  . GERD (gastroesophageal reflux disease)   . History of migraine headaches   . Anemia   . Environmental allergies   . Hiatal hernia     small    Current Outpatient Prescriptions on File Prior to Visit  Medication Sig Dispense Refill  . ALPRAZolam (XANAX) 0.25 MG tablet TAKE 1/2 TO 1 TABLET AT BEDTIME IF NEEDED  30 tablet  1  . FeFum-FePo-FA-B Cmp-C-Zn-Mn-Cu (TANDEM PLUS) 162-115.2-1 MG CAPS TAKE ONE (1) CAPSULE EACH DAY  30 each  5  . omeprazole (PRILOSEC) 20 MG capsule Take 20 mg by mouth daily.       No current facility-administered medications on file prior to visit.    Review of Systems No headache now.  No fever. No sinus or allergy symptoms.   No chest pain or tightness.  No sob.  No cough or congestion.  No abdominal pain or cramping now.  No diarrhea now.  Eating.  Appetite not back to normal yet.  Better.  Is eating.  Vaginal irritation resolved.            Objective:   Physical Exam  Filed Vitals:   02/17/13 0919  BP: 120/80  Pulse: 72  Temp: 98.6 F (28 C)   48 year old female in no acute distress.    HEENT:  Nares- clear.  Oropharynx - without lesions. NECK:  Supple.  Nontender.  No audible bruit.  HEART:  Appears to be regular. LUNGS:  No crackles or wheezing audible.  Respirations even and unlabored.  RADIAL PULSE:  Equal bilaterally.  ABDOMEN:  Soft, nontender.  Bowel sounds present and normal.  No audible abdominal bruit.   EXTREMITIES:  No increased edema present.  DP pulses palpable and equal bilaterally.          Assessment & Plan:  GYN.  Bleeding better since stopping the patch.  Some minimal spotting as outlined.  Keep menstrual diary.  Follow.     INCREASED ANXIETY.  Takes xanax (1/2 tablet q hs).  Follow.    HEALTH MAINTENANCE.  Physical 08/04/12.  Pap negative with negative HPV.  Colonoscopy 02/26/11 normal.  Recommended follow up colonoscopy 3-5 years.  Mammogram 06/25/12.   Schedule f/u mammogram.

## 2013-02-20 ENCOUNTER — Encounter: Payer: Self-pay | Admitting: Internal Medicine

## 2013-02-20 NOTE — Assessment & Plan Note (Signed)
Headaches better since being off hormones.   Follow[facility].   

## 2013-02-20 NOTE — Assessment & Plan Note (Signed)
On Omeprazole.  Symptoms controlled.    

## 2013-02-20 NOTE — Assessment & Plan Note (Signed)
S/p ablation.  Follow tsh.  

## 2013-02-20 NOTE — Assessment & Plan Note (Signed)
Recent hgb and ferritin wnl.  Follow.   

## 2013-02-20 NOTE — Assessment & Plan Note (Signed)
Some hot flashes.  Uses xanax to help her sleep.  Better.  Discussed further intervention.  She desires no further intervention.  Follow.   

## 2013-02-20 NOTE — Assessment & Plan Note (Signed)
Resolved

## 2013-06-16 ENCOUNTER — Ambulatory Visit: Payer: Self-pay | Admitting: Pain Medicine

## 2013-07-06 ENCOUNTER — Ambulatory Visit: Payer: Self-pay | Admitting: Pain Medicine

## 2013-07-19 ENCOUNTER — Other Ambulatory Visit: Payer: Self-pay | Admitting: Internal Medicine

## 2013-07-19 NOTE — Telephone Encounter (Signed)
Refilled xanax #30 with one refill.  rx signed and at your desk.

## 2013-07-19 NOTE — Telephone Encounter (Signed)
Last refill 5.1.15, last OV 1.22.15.  Please advise refill

## 2013-07-26 ENCOUNTER — Ambulatory Visit: Payer: Self-pay | Admitting: Pain Medicine

## 2013-08-09 ENCOUNTER — Encounter: Payer: Self-pay | Admitting: Internal Medicine

## 2013-08-09 ENCOUNTER — Ambulatory Visit (INDEPENDENT_AMBULATORY_CARE_PROVIDER_SITE_OTHER): Payer: No Typology Code available for payment source | Admitting: Internal Medicine

## 2013-08-09 VITALS — BP 100/60 | HR 80 | Temp 98.0°F | Ht 63.0 in | Wt 130.8 lb

## 2013-08-09 DIAGNOSIS — D649 Anemia, unspecified: Secondary | ICD-10-CM

## 2013-08-09 DIAGNOSIS — Z0000 Encounter for general adult medical examination without abnormal findings: Secondary | ICD-10-CM

## 2013-08-09 DIAGNOSIS — G43809 Other migraine, not intractable, without status migrainosus: Secondary | ICD-10-CM

## 2013-08-09 DIAGNOSIS — K219 Gastro-esophageal reflux disease without esophagitis: Secondary | ICD-10-CM

## 2013-08-09 DIAGNOSIS — E0590 Thyrotoxicosis, unspecified without thyrotoxic crisis or storm: Secondary | ICD-10-CM

## 2013-08-09 DIAGNOSIS — N951 Menopausal and female climacteric states: Secondary | ICD-10-CM

## 2013-08-09 DIAGNOSIS — I6529 Occlusion and stenosis of unspecified carotid artery: Secondary | ICD-10-CM

## 2013-08-09 DIAGNOSIS — Z1239 Encounter for other screening for malignant neoplasm of breast: Secondary | ICD-10-CM

## 2013-08-09 LAB — T4, FREE: FREE T4: 0.78 ng/dL (ref 0.60–1.60)

## 2013-08-09 LAB — T3, FREE: T3 FREE: 2.5 pg/mL (ref 2.3–4.2)

## 2013-08-09 LAB — TSH: TSH: 1.17 u[IU]/mL (ref 0.35–4.50)

## 2013-08-09 MED ORDER — ALPRAZOLAM 0.25 MG PO TABS: 0.2500 mg | ORAL_TABLET | Freq: Every evening | ORAL | Status: DC | PRN

## 2013-08-09 NOTE — Progress Notes (Signed)
Pre visit review using our clinic review tool, if applicable. No additional management support is needed unless otherwise documented below in the visit note. 

## 2013-08-10 ENCOUNTER — Encounter: Payer: Self-pay | Admitting: Internal Medicine

## 2013-08-13 ENCOUNTER — Encounter: Payer: Self-pay | Admitting: Internal Medicine

## 2013-08-13 NOTE — Assessment & Plan Note (Signed)
On Omeprazole.  Symptoms controlled.

## 2013-08-13 NOTE — Assessment & Plan Note (Signed)
S/p ablation.  Follow tsh.  

## 2013-08-13 NOTE — Assessment & Plan Note (Signed)
Life screening revealed mild stenosis.  Will check carotid ultrasound to further evaluate.

## 2013-08-13 NOTE — Assessment & Plan Note (Signed)
Recent hgb and ferritin wnl.  Follow.

## 2013-08-13 NOTE — Progress Notes (Signed)
  Subjective:    Patient ID: Shannon [name], female    DOB: 1965/10/02, 48 y.o.   MRN: [id number]  HPI 48 year old female with past history of reoccurring allergy and sinus problems as well as hyperthyroidism s/p ablation who comes in today to follow up on these issues as well as for a complete physical exam.   Off hormones/ocp's.   No bleeding now.  Headaches had not been an issue.  Recently has noticed some headaches - over the last few weeks.  None this week.  No significant sinus or allergy symptoms.  No chest pain or tightness.  No sob.  Breathing stable.  Bowels stable.  Overall she feels she has been doing well.  Had life screening.  Mild carotid stenosis on life screening.     Past Medical History  Diagnosis Date  . Hyperthyroidism     s/p ablation  . GERD (gastroesophageal reflux disease)   . History of migraine headaches   . Anemia   . Environmental allergies   . Hiatal hernia     small    Current Outpatient Prescriptions on File Prior to Visit  Medication Sig Dispense Refill  . FeFum-FePo-FA-B Cmp-C-Zn-Mn-Cu (TANDEM PLUS) 162-115.2-1 MG CAPS TAKE ONE (1) CAPSULE EACH DAY  30 each  5  . omeprazole (PRILOSEC) 20 MG capsule Take 20 mg by mouth daily.       No current facility-administered medications on file prior to visit.    Review of Systems No headache now.  Had been doing better.  Some reoccurrence over the last few weeks.  None over the last week.   No sinus or allergy symptoms.   No chest pain or tightness.  No sob.  No cough or congestion.  No abdominal pain or cramping now.  No diarrhea.  Eating.  No vaginal bleeding or spotting.  S/p injections  Hip and SI joint doing better.             Objective:   Physical Exam  Filed Vitals:   08/09/13 1033  BP: 100/60  Pulse: 80  Temp: 98 F (36.7 C)   Blood pressure recheck:  40/15  48 year old female in no acute distress.   HEENT:  Nares- clear.  Oropharynx - without lesions. NECK:  Supple.  Nontender.   No audible bruit.  HEART:  Appears to be regular. LUNGS:  No crackles or wheezing audible.  Respirations even and unlabored.  RADIAL PULSE:  Equal bilaterally.    BREASTS:  No nipple discharge or nipple retraction present.  Could not appreciate any distinct nodules or axillary adenopathy.  ABDOMEN:  Soft, nontender.  Bowel sounds present and normal.  No audible abdominal bruit.  GU:  Not performed.    EXTREMITIES:  No increased edema present.  DP pulses palpable and equal bilaterally.          Assessment & Plan:  GYN.  No bleeding.      INCREASED ANXIETY.  Takes xanax (1/2 tablet q hs).  Follow.    HEALTH MAINTENANCE.  Physical today.  Pap negative with negative HPV (08/04/12).   Colonoscopy 02/26/11 normal.  Recommended follow up colonoscopy 3-5 years.  Mammogram 06/25/12.   Schedule f/u mammogram.

## 2013-08-13 NOTE — Assessment & Plan Note (Signed)
Some hot flashes.  Uses xanax to help her sleep.  Better.  Discussed further intervention.  She desires no further intervention.  Follow.

## 2013-08-13 NOTE — Assessment & Plan Note (Signed)
Headaches better since being off hormones.   Followed at [facility].

## 2013-08-15 ENCOUNTER — Ambulatory Visit: Payer: Self-pay | Admitting: Pain Medicine

## 2013-08-30 ENCOUNTER — Encounter: Payer: Self-pay | Admitting: Internal Medicine

## 2013-09-19 ENCOUNTER — Encounter: Payer: Self-pay | Admitting: Internal Medicine

## 2013-11-08 ENCOUNTER — Ambulatory Visit: Payer: Self-pay | Admitting: Internal Medicine

## 2013-11-08 ENCOUNTER — Encounter: Payer: Self-pay | Admitting: *Deleted

## 2013-11-08 LAB — HM MAMMOGRAPHY: HM MAMMO: NEGATIVE

## 2013-11-21 ENCOUNTER — Other Ambulatory Visit: Payer: Self-pay | Admitting: Internal Medicine

## 2013-11-21 NOTE — Telephone Encounter (Signed)
Refilled xanax #30 with one refill.

## 2013-11-21 NOTE — Telephone Encounter (Signed)
Last OV 7.14.15, last refill 9.10.15.  Please advise refill

## 2014-01-26 ENCOUNTER — Other Ambulatory Visit: Payer: Self-pay | Admitting: Internal Medicine

## 2014-02-03 ENCOUNTER — Other Ambulatory Visit: Payer: Self-pay | Admitting: Internal Medicine

## 2014-02-03 NOTE — Telephone Encounter (Signed)
ok'd xanax #30 with one refill.   

## 2014-02-03 NOTE — Telephone Encounter (Signed)
rx faxed

## 2014-02-03 NOTE — Telephone Encounter (Signed)
Last appt 08/09/13, next appt 3/16, ok refill?

## 2014-02-06 ENCOUNTER — Ambulatory Visit (INDEPENDENT_AMBULATORY_CARE_PROVIDER_SITE_OTHER): Payer: No Typology Code available for payment source | Admitting: Internal Medicine

## 2014-02-06 ENCOUNTER — Encounter: Payer: Self-pay | Admitting: Internal Medicine

## 2014-02-06 ENCOUNTER — Other Ambulatory Visit (HOSPITAL_COMMUNITY)
Admission: RE | Admit: 2014-02-06 | Discharge: 2014-02-06 | Disposition: A | Discharge status: Home / Self Care | Payer: No Typology Code available for payment source | Source: Ambulatory Visit | Attending: Internal Medicine | Admitting: Internal Medicine

## 2014-02-06 VITALS — BP 124/72 | HR 81 | Temp 98.4°F | Wt 135.0 lb

## 2014-02-06 DIAGNOSIS — Z124 Encounter for screening for malignant neoplasm of cervix: Secondary | ICD-10-CM

## 2014-02-06 DIAGNOSIS — Z01419 Encounter for gynecological examination (general) (routine) without abnormal findings: Secondary | ICD-10-CM | POA: Insufficient documentation

## 2014-02-06 DIAGNOSIS — R102 Pelvic and perineal pain: Secondary | ICD-10-CM

## 2014-02-06 LAB — POCT URINALYSIS DIPSTICK
Bilirubin, UA: NEGATIVE
Glucose, UA: NEGATIVE
Ketones, UA: NEGATIVE
LEUKOCYTES UA: NEGATIVE
Nitrite, UA: NEGATIVE
PH UA: 6
Protein, UA: NEGATIVE
RBC UA: NEGATIVE
Spec Grav, UA: 1.02
Urobilinogen, UA: NEGATIVE

## 2014-02-06 NOTE — Addendum Note (Signed)
Addended by: Lurlean Nanny on: 02/06/2014 05:02 PM   Modules accepted: Orders

## 2014-02-06 NOTE — Progress Notes (Signed)
Pre visit review using our clinic review tool, if applicable. No additional management support is needed unless otherwise documented below in the visit note. 

## 2014-02-06 NOTE — Patient Instructions (Signed)
Pelvic Pain Female pelvic pain can be caused by many different things and start from a variety of places. Pelvic pain refers to pain that is located in the lower half of the abdomen and between your hips. The pain may occur over a short period of time (acute) or may be reoccurring (chronic). The cause of pelvic pain may be related to disorders affecting the female reproductive organs (gynecologic), but it may also be related to the bladder, kidney stones, an intestinal complication, or muscle or skeletal problems. Getting help right away for pelvic pain is important, especially if there has been severe, sharp, or a sudden onset of unusual pain. It is also important to get help right away because some types of pelvic pain can be life threatening.  CAUSES  Below are only some of the causes of pelvic pain. The causes of pelvic pain can be in one of several categories.   Gynecologic.  Pelvic inflammatory disease.  Sexually transmitted infection.  Ovarian cyst or a twisted ovarian ligament (ovarian torsion).  Uterine lining that grows outside the uterus (endometriosis).  Fibroids, cysts, or tumors.  Ovulation.  Pregnancy.  Pregnancy that occurs outside the uterus (ectopic pregnancy).  Miscarriage.  Labor.  Abruption of the placenta or ruptured uterus.  Infection.  Uterine infection (endometritis).  Bladder infection.  Diverticulitis.  Miscarriage related to a uterine infection (septic abortion).  Bladder.  Inflammation of the bladder (cystitis).  Kidney stone(s).  Gastrointestinal.  Constipation.  Diverticulitis.  Neurologic.  Trauma.  Feeling pelvic pain because of mental or emotional causes (psychosomatic).  Cancers of the bowel or pelvis. EVALUATION  Your caregiver will want to take a careful history of your concerns. This includes recent changes in your health, a careful gynecologic history of your periods (menses), and a sexual history. Obtaining your family  history and medical history is also important. Your caregiver may suggest a pelvic exam. A pelvic exam will help identify the location and severity of the pain. It also helps in the evaluation of which organ system may be involved. In order to identify the cause of the pelvic pain and be properly treated, your caregiver may order tests. These tests may include:   A pregnancy test.  Pelvic ultrasonography.  An X-ray exam of the abdomen.  A urinalysis or evaluation of vaginal discharge.  Blood tests. HOME CARE INSTRUCTIONS   Only take over-the-counter or prescription medicines for pain, discomfort, or fever as directed by your caregiver.   Rest as directed by your caregiver.   Eat a balanced diet.   Drink enough fluids to make your urine clear or pale yellow, or as directed.   Avoid sexual intercourse if it causes pain.   Apply warm or cold compresses to the lower abdomen depending on which one helps the pain.   Avoid stressful situations.   Keep a journal of your pelvic pain. Write down when it started, where the pain is located, and if there are things that seem to be associated with the pain, such as food or your menstrual cycle.  Follow up with your caregiver as directed.  SEEK MEDICAL CARE IF:  Your medicine does not help your pain.  You have abnormal vaginal discharge. SEEK IMMEDIATE MEDICAL CARE IF:   You have heavy bleeding from the vagina.   Your pelvic pain increases.   You feel light-headed or faint.   You have chills.   You have pain with urination or blood in your urine.   You have uncontrolled diarrhea   or vomiting.   You have a fever or persistent symptoms for more than 3 days.  You have a fever and your symptoms suddenly get worse.   You are being physically or sexually abused.  MAKE SURE YOU:  Understand these instructions.  Will watch your condition.  Will get help if you are not doing well or get worse. Document Released:  12/11/2003 Document Revised: 05/30/2013 Document Reviewed: 05/05/2011 Exit[employer] Patient Information 2015 [employer]. This information is not intended to replace advice given to you by your health care provider. Make sure you discuss any questions you have with your health care provider.  

## 2014-02-06 NOTE — Progress Notes (Signed)
Subjective:    Patient ID: Shannon [name], female    DOB: 07/13/1965, 49 y.o.   MRN: [id number]  HPI   Shannon [name] is a 49 year old female who presents today with chief complaint of dull and crampy right lower pelvic pain.  Her pain started 3 days ago and also had an episode of painful intercourse.  She has noticed an increase in discharge for 2 days with slight yellow color, but no foul odor.  She also complains of painful urination with an increase in frequency.  She has not taken anything over the counter for pain.  She is post menopausal and has had no vaginal bleeding.    Review of Systems  Constitutional: Negative for fever, chills and fatigue.  Genitourinary: Positive for dysuria, urgency, frequency, vaginal discharge and pelvic pain. Negative for hematuria, flank pain, decreased urine volume, vaginal bleeding, difficulty urinating and vaginal pain.  Musculoskeletal: Negative for back pain.  Skin: Negative for color change, pallor and rash.    Past Medical History  Diagnosis Date  . Hyperthyroidism     s/p ablation  . GERD (gastroesophageal reflux disease)   . History of migraine headaches   . Anemia   . Environmental allergies   . Hiatal hernia     small   Family History  Problem Relation Age of Onset  . Heart disease Father   . Hypertension Father   . Thyroid disease Father   . Hypercholesterolemia Father   . Kidney cancer Father   . Leukemia Father     hairy cell  . Hypercholesterolemia Mother   . Thyroid disease Mother   . Breast cancer      maternal great grandmother  . Heart disease Maternal Grandfather     MI - age 64  . Prostate cancer Paternal Grandfather   . Pancreatic cancer Paternal Grandfather   . Lung cancer Paternal Grandfather    Current Outpatient Prescriptions on File Prior to Visit  Medication Sig Dispense Refill  . ALPRAZolam (XANAX) 0.25 MG tablet TAKE 1 TABLET BY MOUTH AT BEDTIME AS NEEDED FOR ANXIETY 30 tablet 1  . FeFum-FePo-FA-B  Cmp-C-Zn-Mn-Cu (TANDEM PLUS) 162-115.2-1 MG CAPS TAKE ONE (1) CAPSULE EACH DAY 30 each 5  . omeprazole (PRILOSEC) 20 MG capsule Take 20 mg by mouth daily.     No current facility-administered medications on file prior to visit.        Objective:   Physical Exam  Constitutional: She appears well-developed and well-nourished.  HENT:  Head: Normocephalic and atraumatic.  Cardiovascular: Normal rate and regular rhythm.   Pulmonary/Chest: Effort normal and breath sounds normal.  Abdominal: Soft. Bowel sounds are normal. She exhibits no distension and no mass. There is tenderness in the right lower quadrant. There is no rebound, no guarding and no CVA tenderness.  Genitourinary: Vaginal discharge found.  She has white vaginal discharge.  Normal female anatomy and no cervical motion tenderness.  Annexa non palpable.    BP 124/72 mmHg  Pulse 81  Temp(Src) 98.4 F (36.9 C) (Oral)  Wt 135 lb (61.236 kg)  SpO2 99%         Assessment & Plan:  Pelvic pain:  Urinalysis negative.   Performed pelvic exam with pap and wet prep.  Wet prep was negative for yeast, BV and trichomonas.  Will send off pap.    Advised patient to take ibuprofen as needed for pain.  If pap is negative, consider pelvic/transvaginal ultrasound to evaluate ovaries/uterus.  Will follow up with after pap smear, RTC as needed

## 2014-02-08 LAB — CYTOLOGY - PAP

## 2014-02-13 ENCOUNTER — Ambulatory Visit: Payer: No Typology Code available for payment source | Admitting: Internal Medicine

## 2014-02-23 LAB — BASIC METABOLIC PANEL
BUN: 12 mg/dL (ref 4–21)
Creatinine: 0.5 mg/dL (ref 0.5–1.1)
GLUCOSE: 86 mg/dL
POTASSIUM: 4.1 mmol/L (ref 3.4–5.3)
SODIUM: 144 mmol/L (ref 137–147)

## 2014-02-23 LAB — HEPATIC FUNCTION PANEL
ALT: 26 U/L (ref 7–35)
AST: 29 U/L (ref 13–35)
Alkaline Phosphatase: 76 U/L (ref 25–125)
Bilirubin, Total: 0.3 mg/dL

## 2014-03-03 ENCOUNTER — Encounter: Payer: Self-pay | Admitting: *Deleted

## 2014-03-28 ENCOUNTER — Ambulatory Visit (INDEPENDENT_AMBULATORY_CARE_PROVIDER_SITE_OTHER): Payer: No Typology Code available for payment source | Admitting: Internal Medicine

## 2014-03-28 ENCOUNTER — Encounter: Payer: Self-pay | Admitting: Internal Medicine

## 2014-03-28 VITALS — BP 120/90 | HR 67 | Temp 98.0°F | Ht 63.0 in | Wt 134.4 lb

## 2014-03-28 DIAGNOSIS — N951 Menopausal and female climacteric states: Secondary | ICD-10-CM

## 2014-03-28 DIAGNOSIS — I6529 Occlusion and stenosis of unspecified carotid artery: Secondary | ICD-10-CM

## 2014-03-28 DIAGNOSIS — Z0000 Encounter for general adult medical examination without abnormal findings: Secondary | ICD-10-CM

## 2014-03-28 DIAGNOSIS — K219 Gastro-esophageal reflux disease without esophagitis: Secondary | ICD-10-CM

## 2014-03-28 DIAGNOSIS — E0590 Thyrotoxicosis, unspecified without thyrotoxic crisis or storm: Secondary | ICD-10-CM

## 2014-03-28 DIAGNOSIS — G43809 Other migraine, not intractable, without status migrainosus: Secondary | ICD-10-CM

## 2014-03-28 DIAGNOSIS — D649 Anemia, unspecified: Secondary | ICD-10-CM

## 2014-03-28 MED ORDER — ALPRAZOLAM 0.25 MG PO TABS: 0.2500 mg | ORAL_TABLET | Freq: Every evening | ORAL | Status: DC | PRN

## 2014-03-28 NOTE — Progress Notes (Signed)
Pre visit review using our clinic review tool, if applicable. No additional management support is needed unless otherwise documented below in the visit note. 

## 2014-03-28 NOTE — Progress Notes (Signed)
Patient ID: Shannon [name], female   DOB: 1965/04/18, 49 y.o.   MRN: [id number]   Subjective:    Patient ID: Shannon [name], female    DOB: 08/05/1965, 49 y.o.   MRN: [id number]  HPI  Patient here for a scheduled follow up.  She states she is doing relatively well.  Still has trouble sleeping.  Some hot flashes.  Taking alprazolam.  This helps her.  We discussed other treatment options.  She wants to continue her current regimen.  Is stable and feels she is managing ok.  Was having some increased pain a couple of weeks ago.  Is s/p SI injection.  Doing better.  Trying to stay active.  No cardiac symptoms with increased activity or exertion.  Breathing stable.  No acid reflux.  Bowels stable.     Past Medical History  Diagnosis Date  . Hyperthyroidism     s/p ablation  . GERD (gastroesophageal reflux disease)   . History of migraine headaches   . Anemia   . Environmental allergies   . Hiatal hernia     small    Current Outpatient Prescriptions on File Prior to Visit  Medication Sig Dispense Refill  . FeFum-FePo-FA-B Cmp-C-Zn-Mn-Cu (TANDEM PLUS) 162-115.2-1 MG CAPS TAKE ONE (1) CAPSULE EACH DAY 30 each 5  . omeprazole (PRILOSEC) 20 MG capsule Take 20 mg by mouth daily.     No current facility-administered medications on file prior to visit.    Review of Systems  Constitutional: Negative for appetite change and unexpected weight change.  HENT: Negative for congestion and sinus pressure.   Respiratory: Negative for cough, chest tightness and shortness of breath.   Cardiovascular: Negative for chest pain, palpitations and leg swelling.  Gastrointestinal: Negative for nausea, vomiting and abdominal pain.  Genitourinary: Negative for dysuria and difficulty urinating.  Musculoskeletal: Positive for back pain (back pain is better.  is s/p SI injection.  ). Negative for joint swelling.  Skin: Negative for color change and rash.  Neurological: Negative for dizziness and light-headedness.        Objective:   blood pressure recheck:  132/82  Physical Exam  Constitutional: She appears well-developed and well-nourished. No distress.  HENT:  Nose: Nose normal.  Mouth/Throat: Oropharynx is clear and moist.  Neck: Neck supple. No thyromegaly present.  Cardiovascular: Normal rate and regular rhythm.   Pulmonary/Chest: Breath sounds normal. No respiratory distress. She has no wheezes.  Abdominal: Soft. Bowel sounds are normal. There is no tenderness.  Musculoskeletal: She exhibits no edema or tenderness.  Lymphadenopathy:    She has no cervical adenopathy.  Skin: No rash noted. No erythema.    BP 120/90 mmHg  Pulse 67  Temp(Src) 98 F (36.7 C) (Oral)  Ht 5\' 3"  (1.6 m)  Wt 134 lb 6 oz (60.952 kg)  BMI 23.81 kg/m2  SpO2 98% Wt Readings from Last 3 Encounters:  03/28/14 134 lb 6 oz (60.952 kg)  02/06/14 135 lb (61.236 kg)  08/09/13 130 lb 12 oz (59.308 kg)     Lab Results  Component Value Date   WBC 8.7 05/13/2012   HGB 13.7 05/13/2012   HCT 40.5 05/13/2012   PLT 277.0 05/13/2012   GLUCOSE 80 05/13/2012   CHOL 174 05/13/2012   TRIG 62.0 05/13/2012   HDL 57.40 05/13/2012   LDLCALC 104* 05/13/2012   ALT 26 02/23/2014   AST 29 02/23/2014   NA 144 02/23/2014   K 4.1 02/23/2014   CL 102 05/13/2012  CREATININE 0.5 02/23/2014   BUN 12 02/23/2014   CO2 27 05/13/2012   TSH 1.17 08/09/2013       Assessment & Plan:   Problem List Items Addressed This Visit    Anemia    Last hgb wnl.        Carotid stenosis    Life screening revealed some carotid stenosis.  Was referred to vascular surgery.  Needs results.        GERD (gastroesophageal reflux disease)    On omeprazole.  Controlled.        Health care maintenance    Physical 08/09/13.  Mammogram 11/08/13 - Birads I.  Colonoscopy 02/26/11.  Recommended f/u in 3-5 years.        Hyperthyroidism    S/p ablation.  Follow tsh.        Menopausal symptoms    Some hot flashes.  Uses her xanax to help  her sleep.  Discussed further intervention with her today.  She declines.  Wants to follow.        Migraine - Primary    [facility].  Headaches better.            [name], MD

## 2014-04-02 ENCOUNTER — Telehealth: Payer: Self-pay | Admitting: Internal Medicine

## 2014-04-02 ENCOUNTER — Encounter: Payer: Self-pay | Admitting: Internal Medicine

## 2014-04-02 DIAGNOSIS — Z0000 Encounter for general adult medical examination without abnormal findings: Secondary | ICD-10-CM | POA: Insufficient documentation

## 2014-04-02 NOTE — Telephone Encounter (Signed)
Previous visit, pt was referred to vascular surgery for evaluation of carotids (carotid ultrasound).  Needs results of carotid ultrasound from [facility].   Thanks.

## 2014-04-02 NOTE — Assessment & Plan Note (Signed)
Physical 08/09/13.  Mammogram 11/08/13 - Birads I.  Colonoscopy 02/26/11.  Recommended f/u in 3-5 years.

## 2014-04-02 NOTE — Assessment & Plan Note (Signed)
S/p ablation.  Follow tsh.  

## 2014-04-02 NOTE — Assessment & Plan Note (Signed)
Some hot flashes.  Uses her xanax to help her sleep.  Discussed further intervention with her today.  She declines.  Wants to follow.

## 2014-04-02 NOTE — Assessment & Plan Note (Signed)
Followed at [facility].  Headaches better.

## 2014-04-02 NOTE — Assessment & Plan Note (Signed)
On omeprazole.  Controlled.   

## 2014-04-02 NOTE — Assessment & Plan Note (Signed)
Life screening revealed some carotid stenosis.  Was referred to vascular surgery.  Needs results.

## 2014-04-02 NOTE — Assessment & Plan Note (Signed)
Last hgb wnl.  

## 2014-04-03 ENCOUNTER — Telehealth: Payer: Self-pay | Admitting: Internal Medicine

## 2014-04-03 DIAGNOSIS — I6529 Occlusion and stenosis of unspecified carotid artery: Secondary | ICD-10-CM

## 2014-04-03 NOTE — Telephone Encounter (Signed)
Message sent to pt to ask if done.  If not, done will need to schedule.

## 2014-04-03 NOTE — Telephone Encounter (Signed)
Tina from [facility] called and stated the patient has never been seen at that office and has no record of this ultrasound on file.  [facility] Phone- [phone]

## 2014-04-03 NOTE — Telephone Encounter (Signed)
Message sent to pt regarding scheduling the carotid US.

## 2014-04-03 NOTE — Telephone Encounter (Signed)
Results requested

## 2014-04-04 NOTE — Addendum Note (Signed)
Addended by: Alisa Graff on: 04/04/2014 04:22 AM   Modules accepted: Orders

## 2014-04-04 NOTE — Telephone Encounter (Signed)
Order placed for vascular surgery referral.   

## 2014-05-02 ENCOUNTER — Emergency Department
Admit: 2014-05-02 | Disposition: A | Discharge status: Home / Self Care | Payer: Self-pay | Admitting: Emergency Medicine

## 2014-05-02 ENCOUNTER — Telehealth: Payer: Self-pay | Admitting: Internal Medicine

## 2014-05-02 LAB — CBC
HCT: 41.6 % (ref 35.0–47.0)
HGB: 13.7 g/dL (ref 12.0–16.0)
MCH: 30 pg (ref 26.0–34.0)
MCHC: 33 g/dL (ref 32.0–36.0)
MCV: 91 fL (ref 80–100)
Platelet: 237 10*3/uL (ref 150–440)
RBC: 4.58 10*6/uL (ref 3.80–5.20)
RDW: 12.9 % (ref 11.5–14.5)
WBC: 6.1 10*3/uL (ref 3.6–11.0)

## 2014-05-02 LAB — BASIC METABOLIC PANEL
ANION GAP: 9 (ref 7–16)
BUN: 11 mg/dL
BUN: 11 mg/dL (ref 4–21)
Calcium, Total: 9.6 mg/dL
Chloride: 104 mmol/L
Co2: 28 mmol/L
Creatinine: 0.7 mg/dL
Creatinine: 0.7 mg/dL (ref 0.5–1.1)
GLUCOSE: 130 mg/dL — AB
Glucose: 130 mg/dL
POTASSIUM: 3.8 mmol/L
Potassium: 3.8 mmol/L (ref 3.4–5.3)
Sodium: 141 mmol/L
Sodium: 141 mmol/L (ref 137–147)

## 2014-05-02 LAB — TROPONIN I: Troponin-I: <0.03 ng/mL

## 2014-05-02 LAB — CBC AND DIFFERENTIAL
HCT: 42 % (ref 36–46)
Hemoglobin: 13.7 g/dL (ref 12.0–16.0)
PLATELETS: 237 10*3/uL (ref 150–399)
WBC: 6.1 10^3/mL

## 2014-05-02 NOTE — Telephone Encounter (Signed)
Patient Name: LENNA HAGARTY DOB: January 27, 1966 Initial Comment Caller states she is having tightness in chest, belching. Nurse Assessment Nurse: Julien Girt, RN, [facility] Free Date/Time Eilene Ghazi Time): 05/02/2014 11:14:07 AM Confirm and document reason for call. If symptomatic, describe symptoms. ---Caller states she is having chest tightness that began 1 week and is worse today. The tightness has been temporarily relieved by her Prilosec and OTC antacids, but today these meds are not helping. Has the patient traveled out of the country within the last 30 days? ---Not Applicable Does the patient require triage? ---Yes Related visit to physician within the last 2 weeks? ---No Does the PT have any chronic conditions? (i.e. diabetes, asthma, etc.) ---Yes List chronic conditions. ---GERD, **Ultrasound ordered for "precaution" of carotid - result of a "life line screening"**, anxiety, anemia, seasonal allergies Did the patient indicate they were pregnant? ---No Guidelines Guideline Title Affirmed Question Affirmed Notes Chest Pain [1] Chest pain lasts > 5 minutes AND [2] described as crushing, pressure-like, or heavy Final Disposition User Call EMS 911 Now Julien Girt, Therapist, sports, [place] Free

## 2014-05-02 NOTE — Telephone Encounter (Signed)
FYI, spoke with pts husband pt is on the way to the ER via EMS

## 2014-05-02 NOTE — Telephone Encounter (Signed)
Noted - please call tomorrow and get update on pt.

## 2014-05-03 NOTE — Telephone Encounter (Signed)
Records requested, spoke with pt advised of MDs message.  Pt verbalized understanding

## 2014-05-03 NOTE — Telephone Encounter (Signed)
Spoke with pt, she states she was seen in the ER on yesterday and was diagnosed with Acid Reflux, given medication.  Pt states she feels better today.

## 2014-05-03 NOTE — Telephone Encounter (Signed)
Noted.  Need ER records.  Notify pt to let us know if she needs anything more.

## 2014-05-04 ENCOUNTER — Encounter: Payer: Self-pay | Admitting: Internal Medicine

## 2014-05-04 NOTE — Telephone Encounter (Signed)
Records received & placed in green folder

## 2014-05-04 NOTE — Telephone Encounter (Signed)
Will review 

## 2014-05-05 ENCOUNTER — Encounter: Payer: Self-pay | Admitting: Internal Medicine

## 2014-05-06 MED ORDER — ESOMEPRAZOLE MAGNESIUM 40 MG PO CPDR: 40.0000 mg | DELAYED_RELEASE_CAPSULE | Freq: Every day | ORAL | Status: DC

## 2014-05-06 NOTE — Telephone Encounter (Signed)
rx sent in for nexium 40mg  q day #30 with 3 refills.

## 2014-05-09 ENCOUNTER — Encounter: Payer: Self-pay | Admitting: Internal Medicine

## 2014-05-09 NOTE — Telephone Encounter (Signed)
ED noted placed in Dr. Bary Leriche yellow folder

## 2014-05-09 NOTE — Telephone Encounter (Signed)
ER records requested.

## 2014-05-20 ENCOUNTER — Encounter: Payer: Self-pay | Admitting: Internal Medicine

## 2014-05-22 ENCOUNTER — Encounter: Payer: Self-pay | Admitting: Internal Medicine

## 2014-05-22 DIAGNOSIS — K219 Gastro-esophageal reflux disease without esophagitis: Secondary | ICD-10-CM

## 2014-05-23 NOTE — Telephone Encounter (Signed)
Order placed for GI referral.   

## 2014-06-06 ENCOUNTER — Encounter: Payer: Self-pay | Admitting: Internal Medicine

## 2014-06-07 ENCOUNTER — Encounter: Payer: Self-pay | Admitting: Internal Medicine

## 2014-06-07 ENCOUNTER — Other Ambulatory Visit: Payer: Self-pay | Admitting: Internal Medicine

## 2014-06-07 NOTE — Telephone Encounter (Signed)
rx faxed

## 2014-06-07 NOTE — Telephone Encounter (Signed)
Last OV 3.1.16, last refill 4.11.16.  Pt next appoint per [place] 5.17.16.  Please advise refill

## 2014-06-07 NOTE — Telephone Encounter (Signed)
ok'd refill for alprazolam #30 with one refill.

## 2014-06-13 ENCOUNTER — Ambulatory Visit (INDEPENDENT_AMBULATORY_CARE_PROVIDER_SITE_OTHER): Payer: No Typology Code available for payment source | Admitting: Internal Medicine

## 2014-06-13 ENCOUNTER — Encounter: Payer: Self-pay | Admitting: Internal Medicine

## 2014-06-13 VITALS — BP 120/72 | HR 89 | Temp 98.0°F | Resp 16 | Ht 63.0 in | Wt 125.2 lb

## 2014-06-13 DIAGNOSIS — K219 Gastro-esophageal reflux disease without esophagitis: Secondary | ICD-10-CM | POA: Diagnosis not present

## 2014-06-13 DIAGNOSIS — F419 Anxiety disorder, unspecified: Secondary | ICD-10-CM

## 2014-06-13 DIAGNOSIS — R0689 Other abnormalities of breathing: Secondary | ICD-10-CM

## 2014-06-13 NOTE — Patient Instructions (Signed)
Zantac (ranitidine) 150mg - take one tablet 30 minutes before your evening meal 

## 2014-06-13 NOTE — Progress Notes (Signed)
Pre visit review using our clinic review tool, if applicable. No additional management support is needed unless otherwise documented below in the visit note. 

## 2014-06-13 NOTE — Progress Notes (Signed)
Patient ID: Shannon [name], female   DOB: May 29, 1965, 49 y.o.   MRN: [id number]   Subjective:    Patient ID: Shannon [name], female    DOB: 08-Jul-1965, 49 y.o.   MRN: [id number]  HPI  Patient here as a work in for ER follow up.  Was seen in the ER with chest pain and the feeling of not being able to get a good breath.  See ER notes for details.  Had EKG, cxr and labs.  Was given a GI cocktail.  Symptoms improved.  Was placed on carafate.  She is taking this medication.  She saw Dr Donneta Romberg recently secondary to still feeling as if she could not get a good breath.  Was given a breathing treatment and placed on prednisone.  Has pro air. Symptoms improved.  She was previously coughing up green mucus.  This has resolved.  Symptoms improved until this past weekend.  She has adjusted her diet.  Ate "different food" this weekend and noticed increased gas and belching.  She comes in today stating she feels better.  Still has some increased gas.  The discomfort over the weekend has improved.  Has made an appt with Dr Nancie Neas.  appt is months away.  Was questioning if she could get an earlier appt if we would just refer her for endoscopy.    Past Medical History  Diagnosis Date  . Hyperthyroidism     s/p ablation  . GERD (gastroesophageal reflux disease)   . History of migraine headaches   . Anemia   . Environmental allergies   . Hiatal hernia     small    Current Outpatient Prescriptions on File Prior to Visit  Medication Sig Dispense Refill  . ALPRAZolam (XANAX) 0.25 MG tablet TAKE ONE TABLET AT BEDTIME AS NEEDED FORANXIETY 30 tablet 1  . FeFum-FePo-FA-B Cmp-C-Zn-Mn-Cu (TANDEM PLUS) 162-115.2-1 MG CAPS TAKE ONE (1) CAPSULE EACH DAY 30 each 5  . esomeprazole (NEXIUM) 40 MG capsule Take 1 capsule (40 mg total) by mouth daily. (Patient not taking: Reported on 06/13/2014) 30 capsule 3   No current facility-administered medications on file prior to visit.    Review of Systems  Constitutional:     She has adjusted her diet.  Has lost weight.  Has been trying to stick with bland foods.  Increased flare this past weekend after eating broccoli and french fries.    HENT: Negative for congestion and sinus pressure.   Respiratory: Negative for cough and chest tightness.        Has had feeling that she cannot get a good breath.  See above.    Cardiovascular: Negative for chest pain, palpitations and leg swelling.  Gastrointestinal: Negative for nausea, vomiting, abdominal pain and diarrhea.       Increased gas.    Neurological: Negative for dizziness and light-headedness.       Objective:    Physical Exam  Constitutional: She appears well-developed and well-nourished. No distress.  HENT:  Nose: Nose normal.  Mouth/Throat: Oropharynx is clear and moist.  Neck: Neck supple. No thyromegaly present.  Cardiovascular: Normal rate and regular rhythm.   Pulmonary/Chest: Breath sounds normal. No respiratory distress. She has no wheezes.  Abdominal: Soft. Bowel sounds are normal. There is no tenderness.  Musculoskeletal: She exhibits no edema or tenderness.  Lymphadenopathy:    She has no cervical adenopathy.  Psychiatric: She has a normal mood and affect. Her behavior is normal.    BP 120/72 mmHg  Pulse 89  Temp(Src) 98 F (36.7 C) (Oral)  Resp 16  Ht 5\' 3"  (1.6 m)  Wt 125 lb 3.2 oz (56.79 kg)  BMI 22.18 kg/m2  SpO2 97% Wt Readings from Last 3 Encounters:  06/13/14 125 lb 3.2 oz (56.79 kg)  03/28/14 134 lb 6 oz (60.952 kg)  02/06/14 135 lb (61.236 kg)     Lab Results  Component Value Date   WBC 6.1 05/02/2014   WBC 6.1 05/02/2014   HGB 13.7 05/02/2014   HGB 13.7 05/02/2014   HCT 42 05/02/2014   HCT 41.6 05/02/2014   PLT 237 05/02/2014   PLT 237 05/02/2014   GLUCOSE 130* 05/02/2014   CHOL 174 05/13/2012   TRIG 62.0 05/13/2012   HDL 57.40 05/13/2012   LDLCALC 104* 05/13/2012   ALT 26 02/23/2014   AST 29 02/23/2014   NA 141 05/02/2014   NA 141 05/02/2014   K 3.8  05/02/2014   K 3.8 05/02/2014   CL 104 05/02/2014   CREATININE 0.7 05/02/2014   CREATININE 0.70 05/02/2014   BUN 11 05/02/2014   BUN 11 05/02/2014   CO2 28 05/02/2014   TSH 1.17 08/09/2013       Assessment & Plan:   Problem List Items Addressed This Visit    Anxiety    Gets anxious when she feels she cannot breathe.  Has xanax if needed.  Treatment and w/iup as outlined.  Follow.  CXR as outlined.  No acute abnormality.        Breathing difficulty    Noticed at times.  Appears to have worsened with worsening GI issues.  Saw Dr Donneta Romberg.  The breathing treatment, inhaler and prednisone appeared to have helped.  Acid reflux could be aggravating.  Treat acid reflux as outlined.  Use the [employer] as directed.  Follow.  Doubt cardiac origin.  Aggravated by foods, etc.  No reproducible symptoms with increased activity or exertion.  Hold on further cardiac testing.  Follow closely.  Any symptom change or worsening problems, she is to be evaluated immediately.  She is comfortable with this plan.        GERD (gastroesophageal reflux disease) - Primary    Has a history of reflux.  Had been well controlled.  With increased issues recently as outlined.  Noticed not being able to get a good breath at times.  She was questioning if this could be related to some increased anxiety as well.  With increased belching and gas with diet change.  Improvement initially in symptoms with GI cocktail.  Continue PPI.  Continue to monitor diet.  Add zantac in the evening.  See if can get an earlier appt with Dr Nancie Neas for evaluation and question of need for EGD.       Relevant Medications   omeprazole (PRILOSEC) 40 MG capsule     I spent 25 minutes with the patient and more than 50% of the time was spent in consultation regarding the above.     [name], MD

## 2014-06-14 ENCOUNTER — Telehealth: Payer: Self-pay | Admitting: Internal Medicine

## 2014-06-14 NOTE — Telephone Encounter (Signed)
Pt notified of carotid ultrasound results via my chart.  Ok.

## 2014-06-20 ENCOUNTER — Encounter: Payer: Self-pay | Admitting: Internal Medicine

## 2014-06-20 DIAGNOSIS — R0689 Other abnormalities of breathing: Secondary | ICD-10-CM

## 2014-06-20 DIAGNOSIS — F419 Anxiety disorder, unspecified: Secondary | ICD-10-CM | POA: Insufficient documentation

## 2014-06-20 HISTORY — DX: Other abnormalities of breathing: R06.89

## 2014-06-20 NOTE — Assessment & Plan Note (Signed)
Noticed at times.  Appears to have worsened with worsening GI issues.  Saw Dr Donneta Romberg.  The breathing treatment, inhaler and prednisone appeared to have helped.  Acid reflux could be aggravating.  Treat acid reflux as outlined.  Use the [employer] as directed.  Follow.  Doubt cardiac origin.  Aggravated by foods, etc.  No reproducible symptoms with increased activity or exertion.  Hold on further cardiac testing.  Follow closely.  Any symptom change or worsening problems, she is to be evaluated immediately.  She is comfortable with this plan.

## 2014-06-20 NOTE — Assessment & Plan Note (Signed)
Has a history of reflux.  Had been well controlled.  With increased issues recently as outlined.  Noticed not being able to get a good breath at times.  She was questioning if this could be related to some increased anxiety as well.  With increased belching and gas with diet change.  Improvement initially in symptoms with GI cocktail.  Continue PPI.  Continue to monitor diet.  Add zantac in the evening.  See if can get an earlier appt with Dr Nancie Neas for evaluation and question of need for EGD.

## 2014-06-20 NOTE — Assessment & Plan Note (Signed)
Gets anxious when she feels she cannot breathe.  Has xanax if needed.  Treatment and w/iup as outlined.  Follow.  CXR as outlined.  No acute abnormality.

## 2014-06-28 NOTE — Telephone Encounter (Signed)
Unread mychart message mailed to patient 

## 2014-08-03 ENCOUNTER — Other Ambulatory Visit: Payer: Self-pay | Admitting: Internal Medicine

## 2014-08-03 NOTE — Telephone Encounter (Signed)
ok'd refill xanax #30 with one refill.   

## 2014-08-03 NOTE — Telephone Encounter (Signed)
Last visit 06/13/14, refill?

## 2014-08-04 NOTE — Telephone Encounter (Signed)
Faxed to pharmacy

## 2014-08-17 ENCOUNTER — Ambulatory Visit (INDEPENDENT_AMBULATORY_CARE_PROVIDER_SITE_OTHER): Payer: 59 | Admitting: Internal Medicine

## 2014-08-17 ENCOUNTER — Encounter: Payer: Self-pay | Admitting: Internal Medicine

## 2014-08-17 VITALS — BP 108/70 | HR 56 | Temp 98.2°F | Resp 12 | Ht 63.0 in | Wt 125.2 lb

## 2014-08-17 DIAGNOSIS — F419 Anxiety disorder, unspecified: Secondary | ICD-10-CM

## 2014-08-17 DIAGNOSIS — Z0000 Encounter for general adult medical examination without abnormal findings: Secondary | ICD-10-CM | POA: Diagnosis not present

## 2014-08-17 DIAGNOSIS — N760 Acute vaginitis: Secondary | ICD-10-CM | POA: Diagnosis not present

## 2014-08-17 DIAGNOSIS — I6529 Occlusion and stenosis of unspecified carotid artery: Secondary | ICD-10-CM

## 2014-08-17 DIAGNOSIS — E0590 Thyrotoxicosis, unspecified without thyrotoxic crisis or storm: Secondary | ICD-10-CM

## 2014-08-17 DIAGNOSIS — Z13220 Encounter for screening for lipoid disorders: Secondary | ICD-10-CM

## 2014-08-17 DIAGNOSIS — D649 Anemia, unspecified: Secondary | ICD-10-CM

## 2014-08-17 DIAGNOSIS — N951 Menopausal and female climacteric states: Secondary | ICD-10-CM

## 2014-08-17 DIAGNOSIS — K219 Gastro-esophageal reflux disease without esophagitis: Secondary | ICD-10-CM

## 2014-08-17 NOTE — Progress Notes (Signed)
Pre-visit discussion using our clinic review tool. No additional management support is needed unless otherwise documented below in the visit note.  

## 2014-08-17 NOTE — Progress Notes (Signed)
Patient ID: Shannon [name], female   DOB: Apr 08, 1965, 49 y.o.   MRN: [id number]   Subjective:    Patient ID: Shannon [name], female    DOB: [birth year], 49 y.o.   MRN: [id number]  HPI  Patient here to follow up on her current medical issues as well as for a complete physical exam.  She is on prilosec and zantac.  No upper symptoms.  Stays active.  No cardiac symptoms with increased activity or exertion. No sob.  Eating and drinking well.  Has adjusted her diet.  Has lost weight.  Now maintaining.  Reports noticing some vaginal discharge and some irritation.  No abdominal pain or cramping.  Bowels stable.     Past Medical History  Diagnosis Date  . Hyperthyroidism     s/p ablation  . GERD (gastroesophageal reflux disease)   . History of migraine headaches   . Anemia   . Environmental allergies   . Hiatal hernia     small    Outpatient Encounter Prescriptions as of 08/17/2014  Medication Sig  . ALPRAZolam (XANAX) 0.25 MG tablet TAKE ONE TABLET BY MOUTH AT BEDTIME AS NEEDED FOR ANXIETY  . FeFum-FePo-FA-B Cmp-C-Zn-Mn-Cu (TANDEM PLUS) 162-115.2-1 MG CAPS TAKE ONE (1) CAPSULE EACH DAY  . omeprazole (PRILOSEC) 40 MG capsule Take 40 mg by mouth daily.  . ranitidine (ZANTAC) 150 MG tablet Take 150 mg by mouth at bedtime.  . [DISCONTINUED] esomeprazole (NEXIUM) 40 MG capsule Take 1 capsule (40 mg total) by mouth daily.  . Albuterol Sulfate 108 (90 BASE) MCG/ACT AEPB Inhale into the lungs.  . [DISCONTINUED] guaiFENesin (MUCINEX) 600 MG 12 hr tablet Take by mouth 2 (two) times daily.   No facility-administered encounter medications on file as of 08/17/2014.    Review of Systems  Constitutional: Negative for appetite change and unexpected weight change.  HENT: Negative for congestion and sinus pressure.   Eyes: Negative for pain and visual disturbance.  Respiratory: Negative for cough, chest tightness and shortness of breath.   Cardiovascular: Negative for chest pain, palpitations and leg  swelling.  Gastrointestinal: Negative for nausea, vomiting, abdominal pain and diarrhea.  Genitourinary: Negative for dysuria and difficulty urinating.  Musculoskeletal: Negative for back pain and joint swelling.  Skin: Negative for color change and rash.  Neurological: Negative for dizziness, light-headedness and headaches.  Hematological: Negative for adenopathy. Does not bruise/bleed easily.  Psychiatric/Behavioral: Negative for dysphoric mood and agitation.       Objective:     Blood pressure recheck:  128/78  Physical Exam  Constitutional: She is oriented to person, place, and time. She appears well-developed and well-nourished.  HENT:  Nose: Nose normal.  Mouth/Throat: Oropharynx is clear and moist.  Eyes: Right eye exhibits no discharge. Left eye exhibits no discharge. No scleral icterus.  Neck: Neck supple. No thyromegaly present.  Cardiovascular: Normal rate and regular rhythm.   Pulmonary/Chest: Breath sounds normal. No accessory muscle usage. No tachypnea. No respiratory distress. She has no decreased breath sounds. She has no wheezes. She has no rhonchi. Right breast exhibits no inverted nipple, no mass, no nipple discharge and no tenderness (no axillary adenopathy). Left breast exhibits no inverted nipple, no mass, no nipple discharge and no tenderness (no axilarry adenopathy).  Abdominal: Soft. Bowel sounds are normal. There is no tenderness.  Genitourinary:  Normal external genitalia.  Vaginal vault without lesions.  Discharge present.  No cervical lesion.  Could not appreciate any adnexal masses or tenderness.    Musculoskeletal: She exhibits  no edema or tenderness.  Lymphadenopathy:    She has no cervical adenopathy.  Neurological: She is alert and oriented to person, place, and time.  Skin: Skin is warm. No rash noted.  Psychiatric: She has a normal mood and affect. Her behavior is normal.    BP 108/70 mmHg  Pulse 56  Temp(Src) 98.2 F (36.8 C) (Oral)  Resp 12   Ht 5\' 3"  (1.6 m)  Wt 125 lb 4 oz (56.813 kg)  BMI 22.19 kg/m2  SpO2 99% Wt Readings from Last 3 Encounters:  08/17/14 125 lb 4 oz (56.813 kg)  06/13/14 125 lb 3.2 oz (56.79 kg)  03/28/14 134 lb 6 oz (60.952 kg)     Lab Results  Component Value Date   WBC 6.1 05/02/2014   WBC 6.1 05/02/2014   HGB 13.7 05/02/2014   HGB 13.7 05/02/2014   HCT 42 05/02/2014   HCT 41.6 05/02/2014   PLT 237 05/02/2014   PLT 237 05/02/2014   GLUCOSE 130* 05/02/2014   CHOL 174 05/13/2012   TRIG 62.0 05/13/2012   HDL 57.40 05/13/2012   LDLCALC 104* 05/13/2012   ALT 26 02/23/2014   AST 29 02/23/2014   NA 141 05/02/2014   NA 141 05/02/2014   K 3.8 05/02/2014   K 3.8 05/02/2014   CL 104 05/02/2014   CREATININE 0.7 05/02/2014   CREATININE 0.70 05/02/2014   BUN 11 05/02/2014   BUN 11 05/02/2014   CO2 28 05/02/2014   TSH 1.17 08/09/2013       Assessment & Plan:   Problem List Items Addressed This Visit    Anemia    Follow cbc.       Relevant Orders   CBC with Differential/Platelet   Comprehensive metabolic panel   Anxiety    Xanax as needed.  Appears to be doing well.        Carotid stenosis    04/2014 - carotid ultrasound - no significant stenosis.        GERD (gastroesophageal reflux disease)    On prilosec and zantac.  Symptoms doing well.  Has a scheduled f/u with GI in the fall.        Relevant Medications   ranitidine (ZANTAC) 150 MG tablet   Health care maintenance    Physical today - 08/17/14.  Mammogram 11/08/13 - Birads I.  Colonoscopy 02/26/11.  Recommended f/u in 3-5 years.        Hyperthyroidism    S/p ablation.  Follow tsh.        Relevant Orders   TSH   T3, free   T4, free   Menopausal symptoms    Stable.       Vaginitis - Primary    Symptoms as outlined.  KOH/wet prep sent.         Other Visit Diagnoses    Screening cholesterol level        Relevant Orders    Lipid panel        [name], MD

## 2014-08-19 ENCOUNTER — Encounter: Payer: Self-pay | Admitting: Internal Medicine

## 2014-08-19 LAB — WET PREP BY MOLECULAR PROBE
CANDIDA SPECIES: NEGATIVE
Gardnerella vaginalis: NEGATIVE
TRICHOMONAS VAG: NEGATIVE

## 2014-08-19 NOTE — Assessment & Plan Note (Signed)
Follow cbc.  

## 2014-08-19 NOTE — Assessment & Plan Note (Signed)
Xanax as needed.  Appears to be doing well.

## 2014-08-19 NOTE — Assessment & Plan Note (Signed)
Stable

## 2014-08-19 NOTE — Assessment & Plan Note (Signed)
On prilosec and zantac.  Symptoms doing well.  Has a scheduled f/u with GI in the fall.

## 2014-08-19 NOTE — Assessment & Plan Note (Signed)
Symptoms as outlined.  KOH/wet prep sent.

## 2014-08-19 NOTE — Assessment & Plan Note (Signed)
04/2014 - carotid ultrasound - no significant stenosis.

## 2014-08-19 NOTE — Assessment & Plan Note (Addendum)
Physical today - 08/17/14.  Mammogram 11/08/13 - Birads I.  Colonoscopy 02/26/11.  Recommended f/u in 3-5 years.

## 2014-08-19 NOTE — Assessment & Plan Note (Signed)
S/p ablation.  Follow tsh.  

## 2014-08-22 NOTE — Telephone Encounter (Signed)
Unread mychart message mailed to patient 

## 2014-08-30 ENCOUNTER — Other Ambulatory Visit (INDEPENDENT_AMBULATORY_CARE_PROVIDER_SITE_OTHER): Payer: 59

## 2014-08-30 DIAGNOSIS — D649 Anemia, unspecified: Secondary | ICD-10-CM

## 2014-08-30 DIAGNOSIS — Z13220 Encounter for screening for lipoid disorders: Secondary | ICD-10-CM | POA: Diagnosis not present

## 2014-08-30 DIAGNOSIS — E0590 Thyrotoxicosis, unspecified without thyrotoxic crisis or storm: Secondary | ICD-10-CM

## 2014-08-30 LAB — CBC WITH DIFFERENTIAL/PLATELET
Basophils Absolute: 0 10*3/uL (ref 0.0–0.1)
Basophils Relative: 0.5 % (ref 0.0–3.0)
EOS ABS: 0.1 10*3/uL (ref 0.0–0.7)
EOS PCT: 1.9 % (ref 0.0–5.0)
HCT: 38.1 % (ref 36.0–46.0)
Hemoglobin: 13 g/dL (ref 12.0–15.0)
Lymphocytes Relative: 25.4 % (ref 12.0–46.0)
Lymphs Abs: 1.3 10*3/uL (ref 0.7–4.0)
MCHC: 34.1 g/dL (ref 30.0–36.0)
MCV: 92.2 fl (ref 78.0–100.0)
Monocytes Absolute: 0.3 10*3/uL (ref 0.1–1.0)
Monocytes Relative: 5.9 % (ref 3.0–12.0)
NEUTROS PCT: 66.3 % (ref 43.0–77.0)
Neutro Abs: 3.3 10*3/uL (ref 1.4–7.7)
PLATELETS: 210 10*3/uL (ref 150.0–400.0)
RBC: 4.13 Mil/uL (ref 3.87–5.11)
RDW: 13 % (ref 11.5–15.5)
WBC: 4.9 10*3/uL (ref 4.0–10.5)

## 2014-08-30 LAB — COMPREHENSIVE METABOLIC PANEL
ALT: 18 U/L (ref 0–35)
AST: 19 U/L (ref 0–37)
Albumin: 4 g/dL (ref 3.5–5.2)
Alkaline Phosphatase: 64 U/L (ref 39–117)
BILIRUBIN TOTAL: 0.4 mg/dL (ref 0.2–1.2)
BUN: 10 mg/dL (ref 6–23)
CO2: 29 mEq/L (ref 19–32)
Calcium: 9.4 mg/dL (ref 8.4–10.5)
Chloride: 107 mEq/L (ref 96–112)
Creatinine, Ser: 0.62 mg/dL (ref 0.40–1.20)
GFR: 107.87 mL/min (ref >60.00)
GLUCOSE: 75 mg/dL (ref 70–99)
Potassium: 4 mEq/L (ref 3.5–5.1)
Sodium: 143 mEq/L (ref 135–145)
Total Protein: 6.3 g/dL (ref 6.0–8.3)

## 2014-08-30 LAB — LIPID PANEL
Cholesterol: 179 mg/dL (ref 0–200)
HDL: 63.7 mg/dL (ref >39.00)
LDL CALC: 90 mg/dL (ref 0–99)
NonHDL: 115.64
Total CHOL/HDL Ratio: 3
Triglycerides: 129 mg/dL (ref 0.0–149.0)
VLDL: 25.8 mg/dL (ref 0.0–40.0)

## 2014-08-30 LAB — T4, FREE: FREE T4: 0.72 ng/dL (ref 0.60–1.60)

## 2014-08-30 LAB — TSH: TSH: 1.92 u[IU]/mL (ref 0.35–4.50)

## 2014-08-30 LAB — T3, FREE: T3, Free: 2.4 pg/mL (ref 2.3–4.2)

## 2014-08-31 ENCOUNTER — Encounter: Payer: Self-pay | Admitting: Internal Medicine

## 2014-09-04 NOTE — Telephone Encounter (Signed)
Unread mychart message mailed to patient 

## 2014-09-11 ENCOUNTER — Other Ambulatory Visit: Payer: Self-pay | Admitting: Internal Medicine

## 2014-09-11 ENCOUNTER — Other Ambulatory Visit: Payer: Self-pay

## 2014-09-11 DIAGNOSIS — Z1231 Encounter for screening mammogram for malignant neoplasm of breast: Secondary | ICD-10-CM

## 2014-10-04 ENCOUNTER — Other Ambulatory Visit: Payer: Self-pay | Admitting: Internal Medicine

## 2014-10-04 NOTE — Telephone Encounter (Signed)
rx faxed

## 2014-10-04 NOTE — Telephone Encounter (Signed)
ok'd refill xanax #30 with one refill.   

## 2014-10-04 NOTE — Telephone Encounter (Signed)
Last OV 7.21.16.  Please advise refill 

## 2014-10-18 DIAGNOSIS — K219 Gastro-esophageal reflux disease without esophagitis: Secondary | ICD-10-CM | POA: Insufficient documentation

## 2014-10-19 DIAGNOSIS — R1013 Epigastric pain: Secondary | ICD-10-CM | POA: Insufficient documentation

## 2014-10-19 DIAGNOSIS — R0789 Other chest pain: Secondary | ICD-10-CM | POA: Insufficient documentation

## 2014-11-13 ENCOUNTER — Ambulatory Visit
Admission: RE | Admit: 2014-11-13 | Discharge: 2014-11-13 | Disposition: A | Discharge status: Home / Self Care | Payer: 59 | Source: Ambulatory Visit | Attending: Internal Medicine | Admitting: Internal Medicine

## 2014-11-13 DIAGNOSIS — Z1231 Encounter for screening mammogram for malignant neoplasm of breast: Secondary | ICD-10-CM | POA: Diagnosis not present

## 2014-11-16 ENCOUNTER — Encounter: Payer: Self-pay | Admitting: Internal Medicine

## 2014-12-04 ENCOUNTER — Other Ambulatory Visit: Payer: Self-pay | Admitting: Internal Medicine

## 2014-12-04 NOTE — Telephone Encounter (Signed)
Refill on Xanax 

## 2014-12-05 ENCOUNTER — Encounter: Payer: Self-pay | Admitting: Internal Medicine

## 2014-12-06 ENCOUNTER — Other Ambulatory Visit: Payer: Self-pay | Admitting: Internal Medicine

## 2014-12-06 NOTE — Telephone Encounter (Signed)
ok'd refill for xanax #30 with one refill.  rx signed and placed in your box.

## 2014-12-07 ENCOUNTER — Encounter: Payer: Self-pay | Admitting: Internal Medicine

## 2014-12-07 NOTE — Telephone Encounter (Signed)
REFILL ON 12/06/2014

## 2014-12-07 NOTE — Telephone Encounter (Signed)
Faxed to [facility] 

## 2014-12-07 NOTE — Telephone Encounter (Signed)
Per records, rx ok'd 12/06/14.  Please clarify pharmacy received.  Thanks

## 2014-12-08 ENCOUNTER — Encounter: Payer: Self-pay | Admitting: Internal Medicine

## 2014-12-08 LAB — HM COLONOSCOPY: HM Colonoscopy: NORMAL

## 2014-12-16 ENCOUNTER — Encounter: Payer: Self-pay | Admitting: Pain Medicine

## 2014-12-16 DIAGNOSIS — M533 Sacrococcygeal disorders, not elsewhere classified: Secondary | ICD-10-CM

## 2014-12-16 DIAGNOSIS — G8929 Other chronic pain: Secondary | ICD-10-CM | POA: Insufficient documentation

## 2014-12-16 DIAGNOSIS — M5416 Radiculopathy, lumbar region: Secondary | ICD-10-CM

## 2014-12-16 DIAGNOSIS — M545 Low back pain: Secondary | ICD-10-CM

## 2014-12-16 DIAGNOSIS — F17200 Nicotine dependence, unspecified, uncomplicated: Secondary | ICD-10-CM | POA: Insufficient documentation

## 2014-12-16 DIAGNOSIS — M47816 Spondylosis without myelopathy or radiculopathy, lumbar region: Secondary | ICD-10-CM | POA: Insufficient documentation

## 2014-12-16 DIAGNOSIS — M48061 Spinal stenosis, lumbar region without neurogenic claudication: Secondary | ICD-10-CM | POA: Insufficient documentation

## 2014-12-16 DIAGNOSIS — Z720 Tobacco use: Secondary | ICD-10-CM | POA: Insufficient documentation

## 2014-12-16 DIAGNOSIS — M25552 Pain in left hip: Secondary | ICD-10-CM

## 2014-12-18 ENCOUNTER — Ambulatory Visit: Payer: 59 | Attending: Pain Medicine | Admitting: Pain Medicine

## 2014-12-18 ENCOUNTER — Encounter: Payer: Self-pay | Admitting: Pain Medicine

## 2014-12-18 VITALS — BP 117/72 | HR 68 | Temp 98.9°F | Resp 16 | Ht 62.0 in | Wt 122.0 lb

## 2014-12-18 DIAGNOSIS — F419 Anxiety disorder, unspecified: Secondary | ICD-10-CM | POA: Diagnosis not present

## 2014-12-18 DIAGNOSIS — K219 Gastro-esophageal reflux disease without esophagitis: Secondary | ICD-10-CM | POA: Insufficient documentation

## 2014-12-18 DIAGNOSIS — E0590 Thyrotoxicosis, unspecified without thyrotoxic crisis or storm: Secondary | ICD-10-CM | POA: Diagnosis not present

## 2014-12-18 DIAGNOSIS — M1612 Unilateral primary osteoarthritis, left hip: Secondary | ICD-10-CM | POA: Diagnosis not present

## 2014-12-18 DIAGNOSIS — I6529 Occlusion and stenosis of unspecified carotid artery: Secondary | ICD-10-CM | POA: Insufficient documentation

## 2014-12-18 DIAGNOSIS — M25552 Pain in left hip: Secondary | ICD-10-CM | POA: Diagnosis not present

## 2014-12-18 DIAGNOSIS — G43909 Migraine, unspecified, not intractable, without status migrainosus: Secondary | ICD-10-CM | POA: Insufficient documentation

## 2014-12-18 DIAGNOSIS — M533 Sacrococcygeal disorders, not elsewhere classified: Secondary | ICD-10-CM | POA: Diagnosis not present

## 2014-12-18 DIAGNOSIS — M4806 Spinal stenosis, lumbar region: Secondary | ICD-10-CM | POA: Diagnosis not present

## 2014-12-18 DIAGNOSIS — G8929 Other chronic pain: Secondary | ICD-10-CM | POA: Insufficient documentation

## 2014-12-18 DIAGNOSIS — Z87891 Personal history of nicotine dependence: Secondary | ICD-10-CM | POA: Insufficient documentation

## 2014-12-18 DIAGNOSIS — M25559 Pain in unspecified hip: Secondary | ICD-10-CM | POA: Diagnosis present

## 2014-12-18 NOTE — Progress Notes (Signed)
Patient is here today for evaluation.  Has not been back to the office since Dr Adalberto Cole return.  Patient desires hip injection and is here for that evaluation. Does not currently use pain medication.  Uses only tylenol as needed for discomfort.

## 2014-12-18 NOTE — Progress Notes (Signed)
Patient's Name: Shannon [name] MRN: [id number] DOB: Jul 13, 1965 DOS: 12/18/2014  Primary Reason(s) for Visit: Evaluation of uncontrolled established, chronic problem CC: Hip Pain   HPI:   Shannon [name] is a 49 y.o. year old, female patient, who returns today as an established patient. She has Hyperthyroidism; GERD (gastroesophageal reflux disease); Anemia; Migraine; Menopausal symptoms; Carotid stenosis; Health care maintenance; Anxiety; Atypical chest pain; Abdominal pain, epigastric; Gastro-esophageal reflux disease without esophagitis; Chronic pain; Chronic low back pain (Left); Lumbar lateral recess stenosis (L4-5) (Left); Lumbar facet hypertrophy (L4-5) (Left); Chronic lumbar radicular pain (Left) (L4 Dermatome); Chronic left hip pain (intermittent); Chronic left sacroiliac joint pain (intermittent); Nicotine dependence; Tobacco abuse; Osteoarthritis of left hip; Lumbar spondylosis; and Encounter for chronic pain management on her problem list.. Her primarily concern today is the Hip Pain     The patient is well known to me and my service and she does well most of the time but occasionally will have a flareup of her SI joint or hip joint pain at which time she calls and comes in for a injection. Usually the injection keeps her going for a good while until her next flareup. I normally give this patient the option of PRN procedures that she can call to have scheduled.  Today's Pain Score: 5 . Clinically she looks like a 2-3/10. Reported level of pain is compatible with clinical observation Pain Type: Acute pain (started approx 4 -5 weeks ago) Pain Location: Hip Pain Orientation: Left Pain Descriptors / Indicators: Aching, Radiating (when she walks or brings leg up to apply socks and shoes, causes extreme discomfort) Pain Frequency: Intermittent  Date of Last Visit: 08/16/14 Service Provided on Last Visit: Evaluation (f/up from procedure.)  Pharmacotherapy Review: The patient's medication  management is currently not under our care.  Last Available Lab Work: Abstract on 12/08/2014  Component Date Value Ref Range Status  . HM Colonoscopy 12/08/2014 normal-[facility]   Final   Allergies: Shannon [name] is allergic to codeine; decongest-aid; flagyl; and sulfa antibiotics.  Meds: The patient has a current medication list which includes the following prescription(s): acetaminophen, alprazolam, omeprazole, and ranitidine. Requested Prescriptions    No prescriptions requested or ordered in this encounter    ROS: Constitutional: Afebrile, no chills, well hydrated and well nourished Gastrointestinal: negative Musculoskeletal:negative Neurological: negative Behavioral/Psych: negative  PFSH: Medical:  Shannon [name]  has a past medical history of Hyperthyroidism; GERD (gastroesophageal reflux disease); History of migraine headaches; Anemia; Environmental allergies; Hiatal hernia; Breast pain, right (11/07/2012); Vaginitis (01/16/2013); and Breathing difficulty (06/20/2014). Family: family history includes Breast cancer in her mother and another family member; Heart disease in her father and maternal grandfather; Hypercholesterolemia in her father and mother; Hypertension in her father; Kidney cancer in her father; Leukemia in her father; Lung cancer in her paternal grandfather; Pancreatic cancer in her paternal grandfather; Prostate cancer in her paternal grandfather; Thyroid disease in her father and mother. Surgical:  has past surgical history that includes Septoplasty (5/90); Nasal sinus surgery (11/93); Cesarean section (11/98); Tubal ligation (02/1998); Umbilical hernia repair (02/1998); and Cholecystectomy (11/05). Tobacco:  reports that she has quit smoking. She has never used smokeless tobacco. Alcohol:  reports that she does not drink alcohol. Drug:  reports that she does not use illicit drugs.  Physical Exam: Vitals:  Today's Vitals   12/18/14 1052 12/18/14 1059  BP: 117/72    Pulse: 68   Temp: 98.9 F (37.2 C)   TempSrc: Oral   Resp: 16   Height: 5'  2" (1.575 m)   Weight: 122 lb (55.339 kg)   SpO2: 99%   PainSc: 5  5   Calculated BMI: Body mass index is 22.31 kg/(m^2). General appearance: alert, cooperative, appears stated age and mild distress Eyes: conjunctivae/corneas clear. PERRL, EOM's intact. Fundi benign. Lungs: No evidence respiratory distress, no audible rales or ronchi and no use of accessory muscles of respiration Neck: no adenopathy, no carotid bruit, no JVD, supple, symmetrical, trachea midline and thyroid not enlarged, symmetric, no tenderness/mass/nodules Back: symmetric, no curvature. ROM normal. No CVA tenderness. Extremities: extremities normal, atraumatic, no cyanosis or edema Pulses: 2+ and symmetric Skin: Skin color, texture, turgor normal. No rashes or lesions Neurologic: Gait: Antalgic    Assessment: Encounter Diagnosis:  Primary Diagnosis: Chronic left hip pain [M25.552, G89.29]  Plan: Shannon [name] was seen today for hip pain.  Diagnoses and all orders for this visit:  Chronic left hip pain -     HIP INJECTION; Standing  Primary osteoarthritis of left hip -     HIP INJECTION; Future  Chronic pain  Chronic left sacroiliac joint pain (intermittent) -     SACROILIAC JOINT INJECTINS; Standing     There are no Patient Instructions on file for this visit. Medications discontinued today:  Medications Discontinued During This Encounter  Medication Reason  . Albuterol Sulfate 108 (90 BASE) MCG/ACT AEPB Error  . FeFum-FePo-FA-B Cmp-C-Zn-Mn-Cu (TANDEM PLUS) 162-115.2-1 MG CAPS Error   Medications administered today:  Shannon [name] had no medications administered during this visit.  Primary Care Physician: [name], MD Location: [facility] Note by: Kathlen Brunswick. Dossie Arbour, M.D, DABA, DABAPM, DABPM, DABIPP, FIPP

## 2014-12-28 ENCOUNTER — Ambulatory Visit: Payer: 59 | Attending: Pain Medicine | Admitting: Pain Medicine

## 2014-12-28 ENCOUNTER — Encounter: Payer: Self-pay | Admitting: Pain Medicine

## 2014-12-28 VITALS — BP 142/72 | HR 64 | Temp 98.2°F | Resp 16 | Ht 62.0 in | Wt 122.0 lb

## 2014-12-28 DIAGNOSIS — G43909 Migraine, unspecified, not intractable, without status migrainosus: Secondary | ICD-10-CM | POA: Insufficient documentation

## 2014-12-28 DIAGNOSIS — E0590 Thyrotoxicosis, unspecified without thyrotoxic crisis or storm: Secondary | ICD-10-CM | POA: Insufficient documentation

## 2014-12-28 DIAGNOSIS — M25559 Pain in unspecified hip: Secondary | ICD-10-CM | POA: Insufficient documentation

## 2014-12-28 DIAGNOSIS — I6529 Occlusion and stenosis of unspecified carotid artery: Secondary | ICD-10-CM | POA: Diagnosis not present

## 2014-12-28 DIAGNOSIS — M4806 Spinal stenosis, lumbar region: Secondary | ICD-10-CM | POA: Diagnosis not present

## 2014-12-28 DIAGNOSIS — F419 Anxiety disorder, unspecified: Secondary | ICD-10-CM | POA: Insufficient documentation

## 2014-12-28 DIAGNOSIS — M25552 Pain in left hip: Secondary | ICD-10-CM

## 2014-12-28 DIAGNOSIS — M1612 Unilateral primary osteoarthritis, left hip: Secondary | ICD-10-CM

## 2014-12-28 DIAGNOSIS — R1013 Epigastric pain: Secondary | ICD-10-CM | POA: Insufficient documentation

## 2014-12-28 DIAGNOSIS — D649 Anemia, unspecified: Secondary | ICD-10-CM | POA: Diagnosis not present

## 2014-12-28 DIAGNOSIS — R0789 Other chest pain: Secondary | ICD-10-CM | POA: Diagnosis not present

## 2014-12-28 DIAGNOSIS — K219 Gastro-esophageal reflux disease without esophagitis: Secondary | ICD-10-CM | POA: Diagnosis not present

## 2014-12-28 DIAGNOSIS — G8929 Other chronic pain: Secondary | ICD-10-CM

## 2014-12-28 MED ORDER — ROPIVACAINE HCL 2 MG/ML IJ SOLN
INTRAMUSCULAR | Status: AC
  Administered 2014-12-28: 10:00:00
  Filled 2014-12-28: qty 10

## 2014-12-28 MED ORDER — IOHEXOL 180 MG/ML  SOLN
INTRAMUSCULAR | Status: AC
  Administered 2014-12-28: 10:00:00
  Filled 2014-12-28: qty 20

## 2014-12-28 MED ORDER — METHYLPREDNISOLONE ACETATE 80 MG/ML IJ SUSP
INTRAMUSCULAR | Status: AC
  Administered 2014-12-28: 10:00:00
  Filled 2014-12-28: qty 1

## 2014-12-28 MED ORDER — ROPIVACAINE HCL 2 MG/ML IJ SOLN: 4.0000 mL | Freq: Once | INTRAMUSCULAR | Status: DC

## 2014-12-28 MED ORDER — IOHEXOL 180 MG/ML  SOLN
10.0000 mL | Freq: Once | INTRAMUSCULAR | Status: DC | PRN
Start: 2014-12-28 — End: 2015-01-10

## 2014-12-28 MED ORDER — METHYLPREDNISOLONE ACETATE 80 MG/ML IJ SUSP: 80.0000 mg | Freq: Once | INTRAMUSCULAR | Status: DC

## 2014-12-28 MED ORDER — LIDOCAINE HCL (PF) 1 % IJ SOLN: 10.0000 mL | Freq: Once | INTRAMUSCULAR | Status: DC

## 2014-12-28 NOTE — Patient Instructions (Signed)
Facet Joint Block The facet joints connect the bones of the spine (vertebrae). They make it possible for you to bend, twist, and make other movements with your spine. They also prevent you from overbending, overtwisting, and making other excessive movements.  A facet joint block is a procedure where a numbing medicine (anesthetic) is injected into a facet joint. Often, a type of anti-inflammatory medicine called a steroid is also injected. A facet joint block may be done for two reasons:   Diagnosis. A facet joint block may be done as a test to see whether neck or back pain is caused by a worn-down or infected facet joint. If the pain gets better after a facet joint block, it means the pain is probably coming from the facet joint. If the pain does not get better, it means the pain is probably not coming from the facet joint.   Therapy. A facet joint block may be done to relieve neck or back pain caused by a facet joint. A facet joint block is only done as a therapy if the pain does not improve with medicine, exercise programs, physical therapy, and other forms of pain management. LET YOUR HEALTH CARE PROVIDER KNOW ABOUT:   Any allergies you have.   All medicines you are taking, including vitamins, herbs, eyedrops, and over-the-counter medicines and creams.   Previous problems you or members of your family have had with the use of anesthetics.   Any blood disorders you have had.   Other health problems you have. RISKS AND COMPLICATIONS Generally, having a facet joint block is safe. However, as with any procedure, complications can occur. Possible complications associated with having a facet joint block include:   Bleeding.   Injury to a nerve near the injection site.   Pain at the injection site.   Weakness or numbness in areas controlled by nerves near the injection site.   Infection.   Temporary fluid retention.   Allergic reaction to anesthetics or medicines used during  the procedure. BEFORE THE PROCEDURE   Follow your health care provider's instructions if you are taking dietary supplements or medicines. You may need to stop taking them or reduce your dosage.   Do not take any new dietary supplements or medicines without asking your health care provider first.   Follow your health care provider's instructions about eating and drinking before the procedure. You may need to stop eating and drinking several hours before the procedure.   Arrange to have an adult drive you home after the procedure. PROCEDURE  You may need to remove your clothing and dress in an open-back gown so that your health care provider can access your spine.   The procedure will be done while you are lying on an X-ray table. Most of the time you will be asked to lie on your stomach, but you may be asked to lie in a different position if an injection will be made in your neck.   Special machines will be used to monitor your oxygen levels, heart rate, and blood pressure.   If an injection will be made in your neck, an intravenous (IV) tube will be inserted into one of your veins. Fluids and medicine will flow directly into your body through the IV tube.   The area over the facet joint where the injection will be made will be cleaned with an antiseptic soap. The surrounding skin will be covered with sterile drapes.   An anesthetic will be applied to your skin   to make the injection area numb. You may feel a temporary stinging or burning sensation.   A video X-ray machine will be used to locate the joint. A contrast dye may be injected into the facet joint area to help with locating the joint.   When the joint is located, an anesthetic medicine will be injected into the joint through the needle.   Your health care provider will ask you whether you feel pain relief. If you do feel relief, a steroid may be injected to provide pain relief for a longer period of time. If you do not  feel relief or feel only partial relief, additional injections of an anesthetic may be made in other facet joints.   The needle will be removed, the skin will be cleansed, and bandages will be applied.  AFTER THE PROCEDURE   You will be observed for 15-30 minutes before being allowed to go home. Do not drive. Have an adult drive you or take a taxi or public transportation instead.   If you feel pain relief, the pain will return in several hours or days when the anesthetic wears off.   You may feel pain relief 2-14 days after the procedure. The amount of time this relief lasts varies from person to person.   It is normal to feel some tenderness over the injected area(s) for 2 days following the procedure.   If you have diabetes, you may have a temporary increase in blood sugar.   This information is not intended to replace advice given to you by your health care provider. Make sure you discuss any questions you have with your health care provider.   Document Released: 06/04/2006 Document Revised: 02/03/2014 Document Reviewed: 11/03/2011 Elsevier Interactive Patient Education 2016 [employer] Pain Management Discharge Instructions  General Discharge Instructions :  If you need to reach your doctor call: Monday-Friday 8:00 am - 4:00 pm at [phone] or toll free [phone].  After clinic hours [phone] to have operator reach doctor.  Bring all of your medication bottles to all your appointments in the pain clinic.  To cancel or reschedule your appointment with Pain Management please remember to call 24 hours in advance to avoid a fee.  Refer to the educational materials which you have been given on: General Risks, I had my Procedure. Discharge Instructions, Post Sedation.  Post Procedure Instructions:  The drugs you were given will stay in your system until tomorrow, so for the next 24 hours you should not drive, make any legal decisions or drink any alcoholic  beverages.  You may eat anything you prefer, but it is better to start with liquids then soups and crackers, and gradually work up to solid foods.  Please notify your doctor immediately if you have any unusual bleeding, trouble breathing or pain that is not related to your normal pain.  Depending on the type of procedure that was done, some parts of your body may feel week and/or numb.  This usually clears up by tonight or the next day.  Walk with the use of an assistive device or accompanied by an adult for the 24 hours.  You may use ice on the affected area for the first 24 hours.  Put ice in a Ziploc bag and cover with a towel and place against area 15 minutes on 15 minutes off.  You may switch to heat after 24 hours. 

## 2014-12-28 NOTE — Progress Notes (Signed)
Safety precautions to be maintained throughout the outpatient stay will include: orient to surroundings, keep bed in low position, maintain call bell within reach at all times, provide assistance with transfer out of bed and ambulation.  

## 2014-12-28 NOTE — Progress Notes (Signed)
Patient's Name: Shannon [name] MRN: [id number] DOB: 01-06-1966 DOS: 12/28/2014  Primary Reason(s) for Visit: Interventional Pain Management Treatment. CC: Hip Pain I did  Pre-Procedure Assessment: Shannon [name] is a 49 y.o. year old, female patient, seen today for interventional treatment. She has Hyperthyroidism; GERD (gastroesophageal reflux disease); Anemia; Migraine; Menopausal symptoms; Carotid stenosis; Health care maintenance; Anxiety; Atypical chest pain; Abdominal pain, epigastric; Gastro-esophageal reflux disease without esophagitis; Chronic pain; Chronic low back pain (Left); Lumbar lateral recess stenosis (L4-5) (Left); Lumbar facet hypertrophy (L4-5) (Left); Chronic lumbar radicular pain (Left) (L4 Dermatome); Chronic left hip pain (intermittent); Chronic left sacroiliac joint pain (intermittent); Nicotine dependence; Tobacco abuse; Osteoarthritis of left hip; Lumbar spondylosis; and Encounter for chronic pain management on her problem list.. Her primarily concern today is the Hip Pain Verification of the correct person, correct site (including marking of site), and correct procedure were performed and confirmed by the patient. Today's Vitals   12/28/14 1030 12/28/14 1040 12/28/14 1049 12/28/14 1052  BP: 144/86 118/60 134/60 142/72  Pulse: 62 62 58 64  Temp:      Resp: 16 16 14 16   Height:      Weight:      SpO2:      PainSc:    0-No pain  PainLoc:      Calculated BMI: Body mass index is 22.31 kg/(m^2). Allergies: She is allergic to codeine; decongest-aid; flagyl; and sulfa antibiotics.. Primary Diagnosis: Chronic left hip pain [M25.552, G89.29]  Procedure: Type: Palliative Intra-Articular Hip Injection Region:  Posterolateral hip joint area. Level: Lower pelvic and hip joint level. Laterality: Left-Sided  Indications: Hip Joint Pain Hip Joint Arthralgia  Consent: Secured. Under the influence of no sedatives a written informed consent was obtained, after having provided  information on the risks and possible complications. To fulfill our ethical and legal obligations, as recommended by the American Medical Association's Code of Ethics, we have provided information to the patient about our clinical impression; the nature and purpose of the treatment or procedure; the risks, benefits, and possible complications of the intervention; alternatives; the risk(s) and benefit(s) of the alternative treatment(s) or procedure(s); and the risk(s) and benefit(s) of doing nothing. The patient was provided information about the risks and possible complications associated with the procedure. These include, but are not limited to, failure to achieve desired goals, infection, bleeding, organ or nerve damage, allergic reactions, paralysis, and death. In the case of intra- or periarticular procedures these may include, but are not limited to, failure to achieve desired goals, infection, bleeding (hemarthrosis), organ or nerve damage, allergic reactions, and death. In addition, the patient was informed that Medicine is not an exact science; therefore, there is also the possibility of unforeseen risks and possible complications that may result in a catastrophic outcome. The patient indicated having understood very clearly. We have given the patient no guarantees and we have made no promises. Enough time was given to the patient to ask questions, all of which were answered to the patient's satisfaction.  Pre-Procedure Preparation: Safety Precautions: Allergies reviewed. Appropriate site, procedure, and patient were confirmed by following the Joint Commission's Universal Protocol (UP.01.01.01), in the form of a "Time Out". The patient was asked to confirm marked site and procedure, before commencing. The patient was asked about blood thinners, or active infections, both of which were denied. Patient was assessed for positional comfort and all pressure points were checked before starting  procedure. Monitoring:  As per clinic protocol. Infection Control Precautions: Sterile technique used. Standard Universal Precautions  were taken as recommended by the Department of [facility] for Disease Control and Prevention (CDC). Standard pre-surgical skin prep was conducted. Respiratory hygiene and cough etiquette was practiced. Hand hygiene observed. Safe injection practices and needle disposal techniques followed. SDV (single dose vial) medications used. Medications properly checked for expiration dates and contaminants. Personal protective equipment (PPE) used: Radiation resistant gloves.  Anesthesia, Analgesia, Anxiolysis: Type: Local Anesthesia Local Anesthetic(s): Lidocaine 1% Route: Subcutaneous IV Access: Declined. Sedation: Declined. Indication(s): Patient declined.  Description of Procedure Process:  Time-out: "Time-out" completed before starting procedure, as per protocol. Position: Prone Target Area: Superior aspect of the hip joint cavity, going thru the superior portion of the capsular ligament. Approach: Posterolateral approach. Area Prepped: Entire Posterolateral hip area. Prepping solution: ChloraPrep (2% chlorhexidine gluconate and 70% isopropyl alcohol) Safety Precautions: Aspiration looking for blood return was conducted prior to all injections. At no point did we inject any substances, as a needle was being advanced. No attempts were made at seeking any paresthesias. Safe injection practices and needle disposal techniques used. Medications properly checked for expiration dates. SDV (single dose vial) medications used. Description of the Procedure: Protocol guidelines were followed. The patient was placed in position over the fluoroscopy table. The target area was identified and the area prepped in the usual manner. Skin & deeper tissues infiltrated with local anesthetic. Appropriate amount of time allowed to pass for local anesthetics to take effect. The  procedure needles were then advanced to the target area. Proper needle placement secured. Negative aspiration confirmed. Solution injected in intermittent fashion, asking for systemic symptoms every 0.5cc of injectate. The needles were then removed and the area cleansed, making sure to leave some of the prepping solution back to take advantage of its long term bactericidal properties. EBL: Minimal Materials & Medications Used:  Needle(s) Used: 22g - 3.5" Spinal Needle(s) Solution Injected: 0.2% PF-Ropivacaine (50ml) + SDV-DepoMedrol 80 mg/ml (35ml) Medications Administered today: We administered ropivacaine (PF) 2 mg/ml (0.2%), iohexol, and methylPREDNISolone acetate.Please see chart orders for dosing details.  Imaging Guidance:  Type of Imaging Technique: Fluoroscopy Guidance (Non-spinal) Indication(s): Assistance in needle guidance and placement for procedures requiring needle placement in or near specific anatomical locations not easily accessible without such assistance. Exposure Time: Please see nurses notes. Contrast: Not required. None used. Fluoroscopic Guidance: I was personally present in the fluoroscopy suite, where the patient was placed in position for the procedure, over the fluoroscopy-compatible table. Fluoroscopy was manipulated, using "Tunnel Vision Technique", to obtain the best possible view of the target area, on the affected side. Parallax error was corrected before commencing the procedure. A "direction-depth-direction" technique was used to introduce the needle under continuous pulsed fluoroscopic guidance. Once the target was reached, antero-posterior, oblique, and lateral fluoroscopic projection views were taken to confirm needle placement in all planes. Permanently recorded images stored by scanning into EMR. Ultrasound Guidance: Not used. Interpretation: Intraoperative imaging interpretation by performing Physician. Adequate needle placement confirmed. Needle placement  confirmed in AP, lateral, & Oblique Views. Permanent hardcopy images in multiple planes scanned into the patient's record.  Antibiotics:  Type:  Antibiotics Given (last 72 hours)    None      Indication(s): No indications identified or reported.  Post-operative Assessment:  Complications: No immediate post-treatment complications were observed. Disposition: Return to clinic in 2 weeks for follow-up evaluation and interpretation of results. The patient tolerated the entire procedure well. A repeat set of vitals were taken after the procedure and the patient was kept under observation  following institutional policy, for this type of procedure. Post-procedural neurological assessment was performed, showing return to baseline, prior to discharge. The patient was provided with post-procedure discharge instructions, including a section on how to identify potential problems. Should any problems arise concerning this procedure, the patient was given instructions to immediately contact us, at any time, without hesitation. In any case, we plan to contact the patient by telephone for a follow-up status report regarding this interventional procedure. Comments:  No additional relevant information.  Primary Care Physician: [name], MD Location: [facility] Note by: Kathlen Brunswick. Dossie Arbour, M.D, DABA, DABAPM, DABPM, DABIPP, FIPP  Disclaimer:  Medicine is not an exact science. The only guarantee in medicine is that nothing is guaranteed. It is important to note that the decision to proceed with this intervention was based on the information collected from the patient. The Data and conclusions were drawn from the patient's questionnaire, the interview, and the physical examination. Because the information was provided in large part by the patient, it cannot be guaranteed that it has not been purposely or unconsciously manipulated. Every effort has been made to obtain as much  relevant data as possible for this evaluation. It is important to note that the conclusions that lead to this procedure are derived in large part from the available data. Always take into account that the treatment will also be dependent on availability of resources and existing treatment guidelines, considered by other Pain Management Practitioners as being common knowledge and practice, at the time of the intervention. For Medico-Legal purposes, it is also important to point out that variation in procedural techniques and pharmacological choices are the acceptable norm. The indications, contraindications, technique, and results of the above procedure should only be interpreted and judged by a Board-Certified Interventional Pain Specialist with extensive familiarity and expertise in the same exact procedure and technique. Attempts at providing opinions without similar or greater experience and expertise than that of the treating physician will be considered as inappropriate and unethical, and shall result in a formal complaint to the state medical board and applicable specialty societies.

## 2014-12-29 ENCOUNTER — Telehealth: Payer: Self-pay | Admitting: *Deleted

## 2014-12-29 NOTE — Telephone Encounter (Signed)
Patient denies any complications from procedure on yesterday.

## 2015-01-10 ENCOUNTER — Ambulatory Visit: Payer: 59 | Attending: Pain Medicine | Admitting: Pain Medicine

## 2015-01-10 ENCOUNTER — Encounter: Payer: Self-pay | Admitting: Pain Medicine

## 2015-01-10 VITALS — BP 114/76 | HR 90 | Temp 98.6°F | Resp 16 | Ht 62.0 in | Wt 123.0 lb

## 2015-01-10 DIAGNOSIS — I6529 Occlusion and stenosis of unspecified carotid artery: Secondary | ICD-10-CM | POA: Diagnosis not present

## 2015-01-10 DIAGNOSIS — M5416 Radiculopathy, lumbar region: Secondary | ICD-10-CM | POA: Diagnosis not present

## 2015-01-10 DIAGNOSIS — M1612 Unilateral primary osteoarthritis, left hip: Secondary | ICD-10-CM | POA: Insufficient documentation

## 2015-01-10 DIAGNOSIS — K219 Gastro-esophageal reflux disease without esophagitis: Secondary | ICD-10-CM | POA: Diagnosis not present

## 2015-01-10 DIAGNOSIS — F17200 Nicotine dependence, unspecified, uncomplicated: Secondary | ICD-10-CM | POA: Insufficient documentation

## 2015-01-10 DIAGNOSIS — G43909 Migraine, unspecified, not intractable, without status migrainosus: Secondary | ICD-10-CM | POA: Insufficient documentation

## 2015-01-10 DIAGNOSIS — E0590 Thyrotoxicosis, unspecified without thyrotoxic crisis or storm: Secondary | ICD-10-CM | POA: Diagnosis not present

## 2015-01-10 DIAGNOSIS — G8929 Other chronic pain: Secondary | ICD-10-CM | POA: Diagnosis not present

## 2015-01-10 DIAGNOSIS — F419 Anxiety disorder, unspecified: Secondary | ICD-10-CM | POA: Insufficient documentation

## 2015-01-10 DIAGNOSIS — M549 Dorsalgia, unspecified: Secondary | ICD-10-CM | POA: Diagnosis present

## 2015-01-10 DIAGNOSIS — M48061 Spinal stenosis, lumbar region without neurogenic claudication: Secondary | ICD-10-CM

## 2015-01-10 DIAGNOSIS — M4806 Spinal stenosis, lumbar region: Secondary | ICD-10-CM | POA: Diagnosis not present

## 2015-01-10 NOTE — Progress Notes (Signed)
Safety precautions to be maintained throughout the outpatient stay will include: orient to surroundings, keep bed in low position, maintain call bell within reach at all times, provide assistance with transfer out of bed and ambulation.  

## 2015-01-10 NOTE — Patient Instructions (Signed)
Epidural Steroid Injection Patient Information  Description: The epidural space surrounds the nerves as they exit the spinal cord.  In some patients, the nerves can be compressed and inflamed by a bulging disc or a tight spinal canal (spinal stenosis).  By injecting steroids into the epidural space, we can bring irritated nerves into direct contact with a potentially helpful medication.  These steroids act directly on the irritated nerves and can reduce swelling and inflammation which often leads to decreased pain.  Epidural steroids may be injected anywhere along the spine and from the neck to the low back depending upon the location of your pain.   After numbing the skin with local anesthetic (like Novocaine), a small needle is passed into the epidural space slowly.  You may experience a sensation of pressure while this is being done.  The entire block usually last less than 10 minutes.  Conditions which may be treated by epidural steroids:   Low back and leg pain  Neck and arm pain  Spinal stenosis  Post-laminectomy syndrome  Herpes zoster (shingles) pain  Pain from compression fractures  Preparation for the injection:  1. Do not eat any solid food or dairy products within 6 hours of your appointment.  2. You may drink clear liquids up to 2 hours before appointment.  Clear liquids include water, black coffee, juice or soda.  No milk or cream please. 3. You may take your regular medication, including pain medications, with a sip of water before your appointment  Diabetics should hold regular insulin (if taken separately) and take 1/2 normal NPH dos the morning of the procedure.  Carry some sugar containing items with you to your appointment. 4. A driver must accompany you and be prepared to drive you home after your procedure.  5. Bring all your current medications with your. 6. An IV may be inserted and sedation may be given at the discretion of the physician.   7. A blood pressure  cuff, EKG and other monitors will often be applied during the procedure.  Some patients may need to have extra oxygen administered for a short period. 8. You will be asked to provide medical information, including your allergies, prior to the procedure.  We must know immediately if you are taking blood thinners (like Coumadin/Warfarin)  Or if you are allergic to IV iodine contrast (dye). We must know if you could possible be pregnant.  Possible side-effects:  Bleeding from needle site  Infection (rare, may require surgery)  Nerve injury (rare)  Numbness & tingling (temporary)  Difficulty urinating (rare, temporary)  Spinal headache ( a headache worse with upright posture)  Light -headedness (temporary)  Pain at injection site (several days)  Decreased blood pressure (temporary)  Weakness in arm/leg (temporary)  Pressure sensation in back/neck (temporary)  Call if you experience:  Fever/chills associated with headache or increased back/neck pain.  Headache worsened by an upright position.  New onset weakness or numbness of an extremity below the injection site  Hives or difficulty breathing (go to the emergency room)  Inflammation or drainage at the infection site  Severe back/neck pain  Any new symptoms which are concerning to you  Please note:  Although the local anesthetic injected can often make your back or neck feel good for several hours after the injection, the pain will likely return.  It takes 3-7 days for steroids to work in the epidural space.  You may not notice any pain relief for at least that one week.    If effective, we will often do a series of three injections spaced 3-6 weeks apart to maximally decrease your pain.  After the initial series, we generally will wait several months before considering a repeat injection of the same type.  If you have any questions, please call [phone] [facility] 

## 2015-01-10 NOTE — Progress Notes (Signed)
Patient's Name: Shannon [name] MRN: [id number] DOB: August 07, 1965 DOS: 01/10/2015  Primary Reason(s) for Visit: Post-Procedure evaluation CC: Back Pain   HPI:   Shannon [name] is a 49 y.o. year old, female patient, who returns today as an established patient. She has Hyperthyroidism; GERD (gastroesophageal reflux disease); Anemia; Migraine; Menopausal symptoms; Carotid stenosis; Health care maintenance; Anxiety; Atypical chest pain; Abdominal pain, epigastric; Gastro-esophageal reflux disease without esophagitis; Chronic pain; Chronic low back pain (Left); Lumbar lateral recess stenosis (L4-5) (Left); Lumbar facet hypertrophy (L4-5) (Left); Chronic lumbar radicular pain (Left) (L4 Dermatome); Chronic left hip pain (intermittent); Chronic left sacroiliac joint pain (intermittent); Nicotine dependence; Tobacco abuse; Osteoarthritis of left hip; Lumbar spondylosis; and Encounter for chronic pain management on her problem list.. Her primarily concern today is the Back Pain     The patient returns to the clinic today after a left sided intra-articular hip injection under fluoroscopic guidance, without sedation. She indicates 100% relief of the hip pain. However, now that the pain is better, she has noticed some low back pain that goes down through the left lower extremity all the way down into her foot in what seems to be an L4 dermatomal distribution. The patient has a history of some left L4-5 lateral recess stenosis secondary to lumbar facet hypertrophy. Because her pain is radicular, we will be scheduling her to come back for a left-sided L4-5 lumbar epidural steroid injection under fluoroscopic guidance, without sedation.  Today's Pain Score: 1  Reported level of pain is compatible with clinical observation Pain Type: Chronic pain Pain Descriptors / Indicators: Aching Pain Frequency: Intermittent  Date of Last Visit: 12/28/14 Service Provided on Last Visit: Procedure  Pharmacotherapy Review: The  patient's medication management is currently not under our care  Lab Work: Inflammation Markers No results found for: ESRSEDRATE, CRP  Renal Function Lab Results  Component Value Date   BUN 10 08/30/2014   CREATININE 0.62 08/30/2014   GFRAA >60 05/02/2014   GFRNONAA >60 05/02/2014    Hepatic Function Lab Results  Component Value Date   AST 19 08/30/2014   ALT 18 08/30/2014   ALBUMIN 4.0 08/30/2014    Electrolytes Lab Results  Component Value Date   NA 143 08/30/2014   K 4.0 08/30/2014   CL 107 08/30/2014   CALCIUM 9.4 [id number]    Illicit Drugs No results found for: THCU, COCAINSCRNUR, PCPSCRNUR, MDMA, AMPHETMU, METHADONE, ETOH  Procedure Assessment:  Procedure done on last visit: Left intra-articular hip injection. Side-effects or Adverse reactions: None reported Sedation: No sedation used during procedure  Results: Ultra-Short Term Relief (First 1 hour after procedure): 100 %  Ultra-short term relief is a normal physiological response to analgesics and anesthetics provided during the procedure Short Term Relief (Initial 4-6 hrs after procedure): 100 % Short-term relief confirms injected site as etiology of pain Long Term Relief : 100 % Long term relief is possibly due to sympathetic blockade, or the anti-inflammatory effects of steroids, if administered during procedure Current Relief (Now):  100% Persistent relief would suggest effective anti-inflammatory effects from steroids Interpretation of Results: There continues to be clear evidence of left hip arthralgia/arthropathy. However, this patient also has problems with a radiculitis at this point.  Allergies: Shannon [name] is allergic to codeine; decongest-aid; flagyl; and sulfa antibiotics.  Meds: The patient has a current medication list which includes the following prescription(s): acetaminophen, alprazolam, omeprazole, and ranitidine. Requested Prescriptions    No prescriptions requested or ordered in this  encounter    ROS: Constitutional:  Afebrile, no chills, well hydrated and well nourished Gastrointestinal: negative Musculoskeletal:negative Neurological: negative Behavioral/Psych: negative  PFSH: Medical:  Shannon [name]  has a past medical history of Hyperthyroidism; GERD (gastroesophageal reflux disease); History of migraine headaches; Anemia; Environmental allergies; Hiatal hernia; Breast pain, right (11/07/2012); Vaginitis (01/16/2013); and Breathing difficulty (06/20/2014). Family: family history includes Breast cancer in her mother; Heart disease in her father and maternal grandfather; Hypercholesterolemia in her father and mother; Hypertension in her father; Kidney cancer in her father; Leukemia in her father; Lung cancer in her paternal grandfather; Pancreatic cancer in her paternal grandfather; Prostate cancer in her paternal grandfather; Thyroid disease in her father and mother. Surgical:  has past surgical history that includes Septoplasty (5/90); Nasal sinus surgery (11/93); Cesarean section (11/98); Tubal ligation (02/1998); Umbilical hernia repair (02/1998); and Cholecystectomy (11/05). Tobacco:  reports that she has quit smoking. She has never used smokeless tobacco. Alcohol:  reports that she does not drink alcohol. Drug:  reports that she does not use illicit drugs.  Physical Exam: Vitals:  Today's Vitals   01/10/15 0834 01/10/15 0836  BP: 114/76   Pulse: 90   Temp: 98.6 F (37 C)   TempSrc: Oral   Resp: 16   Height: 5\' 2"  (1.575 m)   Weight: 123 lb (55.792 kg)   SpO2: 100%   PainSc:  1   Calculated BMI: Body mass index is 22.49 kg/(m^2). General appearance: alert, cooperative, appears stated [name] and mild distress Eyes: PERLA Respiratory: No evidence respiratory distress, no audible rales or ronchi and no use of accessory muscles of respiration Neck: no adenopathy, no carotid bruit, no JVD, supple, symmetrical, trachea midline and thyroid not enlarged, symmetric, no  tenderness/mass/nodules  Cervical Spine ROM: Adequate for flexion, extension, rotation, and lateral bending Palpation: No palpable trigger points  Upper Extremities ROM: Adequate bilaterally Strength: 5/5 for all flexors and extensors of the upper extremity, bilaterally Pulses: Palpable bilaterally Neurologic: No allodynia, No hyperesthesia, No hyperpathia and No sensory abnormalities  Lumbar Spine ROM: Adequate for flexion, extension, rotation, and lateral bending Palpation: No palpable trigger points Lumbar Hyperextension and rotation: Non-contributory Patrick's Maneuver: Non-contributory  Lower Extremities ROM: Adequate bilaterally Strength: 5/5 for all flexors and extensors of the lower extremity, bilaterally. Some guarding observed. Pulses: Palpable bilaterally Neurologic: No allodynia, No hyperesthesia, No hyperpathia, No sensory abnormalities and No antalgic gait or posture  Assessment: Encounter Diagnosis:  Primary Diagnosis: Stenosis of lateral recess of lumbar spine [M48.06]  Plan: Interventional Therapies: Left L4-5 lumbar epidural steroid injection under fluoroscopic guidance. No sedation.  Shannon [name] was seen today for back pain.  Diagnoses and all orders for this visit:  Lumbar lateral recess stenosis (L4-5) (Left) -     LUMBAR EPIDURAL STEROID INJECTION; Future  Chronic lumbar radicular pain (Left) (L4 Dermatome) -     LUMBAR EPIDURAL STEROID INJECTION; Future     Patient Instructions  Epidural Steroid Injection Patient Information  Description: The epidural space surrounds the nerves as they exit the spinal cord.  In some patients, the nerves can be compressed and inflamed by a bulging disc or a tight spinal canal (spinal stenosis).  By injecting steroids into the epidural space, we can bring irritated nerves into direct contact with a potentially helpful medication.  These steroids act directly on the irritated nerves and can reduce swelling and  inflammation which often leads to decreased pain.  Epidural steroids may be injected anywhere along the spine and from the neck to the low back depending upon the location of  your pain.   After numbing the skin with local anesthetic (like Novocaine), a small needle is passed into the epidural space slowly.  You may experience a sensation of pressure while this is being done.  The entire block usually last less than 10 minutes.  Conditions which may be treated by epidural steroids:   Low back and leg pain  Neck and arm pain  Spinal stenosis  Post-laminectomy syndrome  Herpes zoster (shingles) pain  Pain from compression fractures  Preparation for the injection:  1. Do not eat any solid food or dairy products within 6 hours of your appointment.  2. You may drink clear liquids up to 2 hours before appointment.  Clear liquids include water, black coffee, juice or soda.  No milk or cream please. 3. You may take your regular medication, including pain medications, with a sip of water before your appointment  Diabetics should hold regular insulin (if taken separately) and take 1/2 normal NPH dos the morning of the procedure.  Carry some sugar containing items with you to your appointment. 4. A driver must accompany you and be prepared to drive you home after your procedure.  5. Bring all your current medications with your. 6. An IV may be inserted and sedation may be given at the discretion of the physician.   7. A blood pressure cuff, EKG and other monitors will often be applied during the procedure.  Some patients may need to have extra oxygen administered for a short period. 8. You will be asked to provide medical information, including your allergies, prior to the procedure.  We must know immediately if you are taking blood thinners (like Coumadin/Warfarin)  Or if you are allergic to IV iodine contrast (dye). We must know if you could possible be pregnant.  Possible  side-effects:  Bleeding from needle site  Infection (rare, may require surgery)  Nerve injury (rare)  Numbness & tingling (temporary)  Difficulty urinating (rare, temporary)  Spinal headache ( a headache worse with upright posture)  Light -headedness (temporary)  Pain at injection site (several days)  Decreased blood pressure (temporary)  Weakness in arm/leg (temporary)  Pressure sensation in back/neck (temporary)  Call if you experience:  Fever/chills associated with headache or increased back/neck pain.  Headache worsened by an upright position.  New onset weakness or numbness of an extremity below the injection site  Hives or difficulty breathing (go to the emergency room)  Inflammation or drainage at the infection site  Severe back/neck pain  Any new symptoms which are concerning to you  Please note:  Although the local anesthetic injected can often make your back or neck feel good for several hours after the injection, the pain will likely return.  It takes 3-7 days for steroids to work in the epidural space.  You may not notice any pain relief for at least that one week.  If effective, we will often do a series of three injections spaced 3-6 weeks apart to maximally decrease your pain.  After the initial series, we generally will wait several months before considering a repeat injection of the same type.  If you have any questions, please call [phone] [facility]  Medications discontinued today:  Medications Discontinued During This Encounter  Medication Reason  . iohexol (OMNIPAQUE) 180 MG/ML injection 10 mL Error  . lidocaine (PF) (XYLOCAINE) 1 % injection 10 mL Error  . methylPREDNISolone acetate (DEPO-MEDROL) injection 80 mg Error  . ropivacaine (PF) 2 mg/ml (0.2%) (  NAROPIN) epidural 4 mL Error   Medications administered today:  Shannon [name] had no medications administered during this visit.  Primary Care  Physician: [name], MD Location: [facility] Facility Note by: Kathlen Brunswick. Dossie Arbour, M.D, DABA, DABAPM, DABPM, DABIPP, FIPP

## 2015-01-11 ENCOUNTER — Ambulatory Visit: Payer: 59 | Attending: Pain Medicine | Admitting: Pain Medicine

## 2015-01-11 ENCOUNTER — Encounter: Payer: Self-pay | Admitting: Pain Medicine

## 2015-01-11 VITALS — BP 108/66 | HR 74 | Temp 98.6°F | Resp 17 | Ht 62.0 in | Wt 123.0 lb

## 2015-01-11 DIAGNOSIS — M1612 Unilateral primary osteoarthritis, left hip: Secondary | ICD-10-CM | POA: Insufficient documentation

## 2015-01-11 DIAGNOSIS — M5416 Radiculopathy, lumbar region: Secondary | ICD-10-CM | POA: Diagnosis not present

## 2015-01-11 DIAGNOSIS — M47817 Spondylosis without myelopathy or radiculopathy, lumbosacral region: Secondary | ICD-10-CM | POA: Insufficient documentation

## 2015-01-11 DIAGNOSIS — D649 Anemia, unspecified: Secondary | ICD-10-CM | POA: Diagnosis not present

## 2015-01-11 DIAGNOSIS — G8929 Other chronic pain: Secondary | ICD-10-CM | POA: Insufficient documentation

## 2015-01-11 DIAGNOSIS — M25559 Pain in unspecified hip: Secondary | ICD-10-CM | POA: Diagnosis present

## 2015-01-11 DIAGNOSIS — G43909 Migraine, unspecified, not intractable, without status migrainosus: Secondary | ICD-10-CM | POA: Diagnosis not present

## 2015-01-11 DIAGNOSIS — K219 Gastro-esophageal reflux disease without esophagitis: Secondary | ICD-10-CM | POA: Insufficient documentation

## 2015-01-11 DIAGNOSIS — F419 Anxiety disorder, unspecified: Secondary | ICD-10-CM | POA: Diagnosis not present

## 2015-01-11 DIAGNOSIS — M48061 Spinal stenosis, lumbar region without neurogenic claudication: Secondary | ICD-10-CM

## 2015-01-11 DIAGNOSIS — I6529 Occlusion and stenosis of unspecified carotid artery: Secondary | ICD-10-CM | POA: Insufficient documentation

## 2015-01-11 DIAGNOSIS — E0590 Thyrotoxicosis, unspecified without thyrotoxic crisis or storm: Secondary | ICD-10-CM | POA: Insufficient documentation

## 2015-01-11 DIAGNOSIS — M4806 Spinal stenosis, lumbar region: Secondary | ICD-10-CM | POA: Diagnosis not present

## 2015-01-11 MED ORDER — SODIUM CHLORIDE 0.9 % IJ SOLN: 2.0000 mL | Freq: Once | INTRAMUSCULAR | Status: DC

## 2015-01-11 MED ORDER — ROPIVACAINE HCL 2 MG/ML IJ SOLN: 2.0000 mL | Freq: Once | INTRAMUSCULAR | Status: DC

## 2015-01-11 MED ORDER — SODIUM CHLORIDE 0.9 % IJ SOLN
INTRAMUSCULAR | Status: AC
  Administered 2015-01-11: 09:00:00
  Filled 2015-01-11: qty 10

## 2015-01-11 MED ORDER — TRIAMCINOLONE ACETONIDE 40 MG/ML IJ SUSP: 40.0000 mg | Freq: Once | INTRAMUSCULAR | Status: DC

## 2015-01-11 MED ORDER — TRIAMCINOLONE ACETONIDE 40 MG/ML IJ SUSP
INTRAMUSCULAR | Status: AC
  Administered 2015-01-11: 09:00:00
  Filled 2015-01-11: qty 1

## 2015-01-11 MED ORDER — ROPIVACAINE HCL 2 MG/ML IJ SOLN
INTRAMUSCULAR | Status: AC
  Administered 2015-01-11: 09:00:00
  Filled 2015-01-11: qty 10

## 2015-01-11 MED ORDER — LIDOCAINE HCL (PF) 1 % IJ SOLN
INTRAMUSCULAR | Status: AC
  Administered 2015-01-11: 09:00:00
  Filled 2015-01-11: qty 5

## 2015-01-11 MED ORDER — LIDOCAINE HCL (PF) 1 % IJ SOLN: 10.0000 mL | Freq: Once | INTRAMUSCULAR | Status: DC

## 2015-01-11 MED ORDER — IOHEXOL 180 MG/ML  SOLN
INTRAMUSCULAR | Status: AC
  Administered 2015-01-11: 09:00:00
  Filled 2015-01-11: qty 20

## 2015-01-11 MED ORDER — IOHEXOL 180 MG/ML  SOLN: 10.0000 mL | Freq: Once | INTRAMUSCULAR | Status: DC | PRN

## 2015-01-11 NOTE — Progress Notes (Signed)
Patient's Name: Shannon [name] MRN: [id number] DOB: 09/28/1965 DOS: 01/11/2015  Primary Reason(s) for Visit: Interventional Pain Management Treatment. CC: Hip Pain   Pre-Procedure Assessment: Ms. Cisek is a 49 y.o. year old, female patient, seen today for interventional treatment. She has Hyperthyroidism; GERD (gastroesophageal reflux disease); Anemia; Migraine; Menopausal symptoms; Carotid stenosis; Health care maintenance; Anxiety; Atypical chest pain; Abdominal pain, epigastric; Gastro-esophageal reflux disease without esophagitis; Chronic pain; Chronic low back pain (Left); Lumbar lateral recess stenosis (L4-5) (Left); Lumbar facet hypertrophy (L4-5) (Left); Chronic lumbar radicular pain (Left) (L4 Dermatome); Chronic left hip pain (intermittent); Chronic sacroiliac joint pain (intermittent) (Left); Nicotine dependence; Tobacco abuse; Osteoarthritis of hip (Left); Lumbar spondylosis; Encounter for chronic pain management; and Lumbar radiculopathy, acute (Left) on her problem list.. Her primarily concern today is the Hip Pain Verification of the correct person, correct site (including marking of site), and correct procedure were performed and confirmed by the patient. Today's Vitals   01/11/15 0904 01/11/15 0920 01/11/15 0925 01/11/15 0927  BP:  119/74 119/69 108/66  Pulse:  81 81 74  Temp:      TempSrc:      Resp:  14 15 17  Height:      Weight:      SpO2:  99% 98%   PainSc: 2    0-No pain  Calculated BMI: Body mass index is 22.49 kg/(m^2). Allergies: She is allergic to codeine; decongest-aid; flagyl; and sulfa antibiotics.. Primary Diagnosis: Lumbar radiculopathy, acute [M54.16]  Procedure: Type: Therapeutic Inter-Laminar Epidural Steroid Injection Region: Lumbar Level: L4-5 Level. Laterality: Left-Sided Paramedial  Indications: Spondylosis, Lumbosacral Region  Consent: Secured. Under the influence of no sedatives a written informed consent was obtained, after having provided  information on the risks and possible complications. To fulfill our ethical and legal obligations, as recommended by the American Medical Association's Code of Ethics, we have provided information to the patient about our clinical impression; the nature and purpose of the treatment or procedure; the risks, benefits, and possible complications of the intervention; alternatives; the risk(s) and benefit(s) of the alternative treatment(s) or procedure(s); and the risk(s) and benefit(s) of doing nothing. The patient was provided information about the risks and possible complications associated with the procedure. In the case of spinal procedures these may include, but are not limited to, failure to achieve desired goals, infection, bleeding, organ or nerve damage, allergic reactions, paralysis, and death. In addition, the patient was informed that Medicine is not an exact science; therefore, there is also the possibility of unforeseen risks and possible complications that may result in a catastrophic outcome. The patient indicated having understood very clearly. We have given the patient no guarantees and we have made no promises. Enough time was given to the patient to ask questions, all of which were answered to the patient's satisfaction.  Pre-Procedure Preparation: Safety Precautions: Allergies reviewed. Appropriate site, procedure, and patient were confirmed by following the Joint Commission's Universal Protocol (UP.01.01.01), in the form of a "Time Out". The patient was asked to confirm marked site and procedure, before commencing. The patient was asked about blood thinners, or active infections, both of which were denied. Patient was assessed for positional comfort and all pressure points were checked before starting procedure. Monitoring:  As per clinic protocol. Infection Control Precautions: Sterile technique used. Standard Universal Precautions were taken as recommended by the Department of Human Services  Center for Disease Control and Prevention (CDC). Standard pre-surgical skin prep was conducted. Respiratory hygiene and cough etiquette was practiced. Hand hygiene   observed. Safe injection practices and needle disposal techniques followed. SDV (single dose vial) medications used. Medications properly checked for expiration dates and contaminants. Personal protective equipment (PPE) used: Surgical mask. Sterile double glove technique. Radiation resistant gloves. Sterile surgical gloves.  Anesthesia, Analgesia, Anxiolysis: Type: Local Anesthesia Local Anesthetic(s): Lidocaine 1% Route: Subcutaneous IV Access: Declined. Sedation: Declined. Indication(s):Not applicable.  Description of Procedure Process:  Time-out: "Time-out" completed before starting procedure, as per protocol. Position: Prone Target Area: For Epidural Steroid injections, the target area is the  interlaminar space, initially targeting the lower border of the superior vertebral body lamina. Approach: Posterior approach. Area Prepped: Entire Posterior Lumbosacral Region Prepping solution: ChloraPrep (2% chlorhexidine gluconate and 70% isopropyl alcohol) Safety Precautions: Aspiration looking for blood return was conducted prior to all injections. At no point did we inject any substances, as a needle was being advanced. No attempts were made at seeking any paresthesias. Safe injection practices and needle disposal techniques used. Medications properly checked for expiration dates. SDV (single dose vial) medications used. Description of the Procedure: Protocol guidelines were followed. The patient was placed in position over the fluoroscopy table. The target area was identified and the area prepped in the usual manner. Skin desensitized using vapocoolant spray. Skin & deeper tissues infiltrated with local anesthetic. Appropriate amount of time allowed to pass for local anesthetics to take effect. The procedure needle was introduced  through the skin, ipsilateral to the reported pain, and advanced to the target area. Bone was contacted and the needle walked caudad, until the lamina was cleared. The epidural space was identified using "loss-of-resistance technique" with 2-3 ml of PF-NaCl (0.9% NSS), in a 5cc LOR glass syringe. Proper needle placement secured. Negative aspiration confirmed. Solution injected in intermittent fashion, asking for systemic symptoms every 0.5cc of injectate. The needles were then removed and the area cleansed, making sure to leave some of the prepping solution back to take advantage of its long term bactericidal properties. EBL: None Materials & Medications Used:  Needle(s) Used: 20g - 10cm, Tuohy-style epidural needle Medications Administered today: We administered triamcinolone acetonide, ropivacaine (PF) 2 mg/ml (0.2%), iohexol, lidocaine (PF), and sodium chloride.Please see chart orders for dosing details.  Imaging Guidance:  Type of Imaging Technique: Fluoroscopy Guidance (Spinal) Indication(s): Assistance in needle guidance and placement for procedures requiring needle placement in or near specific anatomical locations not easily accessible without such assistance. Exposure Time: Please see nurses notes. Contrast: Before injecting any contrast, we confirmed that the patient did not have an allergy to iodine, shellfish, or radiological contrast. Once satisfactory needle placement was completed at the desired level, radiological contrast was injected. Injection was conducted under continuous fluoroscopic guidance. Injection of contrast accomplished without complications. See chart for type and volume of contrast used. Fluoroscopic Guidance: I was personally present in the fluoroscopy suite, where the patient was placed in position for the procedure, over the fluoroscopy-compatible table. Fluoroscopy was manipulated, using "Tunnel Vision Technique", to obtain the best possible view of the target area, on  the affected side. Parallax error was corrected before commencing the procedure. A "direction-depth-direction" technique was used to introduce the needle under continuous pulsed fluoroscopic guidance. Once the target was reached, antero-posterior, oblique, and lateral fluoroscopic projection views were taken to confirm needle placement in all planes. Permanently recorded images stored by scanning into EMR. Interpretation: Intraoperative imaging interpretation by performing Physician. Adequate needle placement confirmed. Adequate needle placement confirmed in AP, lateral, & Oblique Views. Appropriate spread of contrast to desired area. No evidence of  afferent or efferent intravascular uptake. No intrathecal or subarachnoid spread observed. Permanent hardcopy images in multiple planes scanned into the patient's record.  Antibiotics:  Type:  Antibiotics Given (last 72 hours)    None      Indication(s): No indications identified.  Post-operative Assessment:  Complications: No immediate post-treatment complications were observed. Relevant Post-operative Information:  Disposition: Return to clinic for follow-up evaluation. The patient tolerated the entire procedure well. A repeat set of vitals were taken after the procedure and the patient was kept under observation following institutional policy, for this procedure. Post-procedural neurological assessment was performed, showing return to baseline, prior to discharge. The patient was discharged home, once institutional criteria were met. The patient was provided with post-procedure discharge instructions, including a section on how to identify potential problems. Should any problems arise concerning this procedure, the patient was given instructions to immediately contact us, at any time, without hesitation. In any case, we plan to contact the patient by telephone for a follow-up status report regarding this interventional procedure. Comments:  No  additional relevant information.  Primary Care Physician: [name], MD Location: [facility] Facility Note by: Kathlen Brunswick. Dossie Arbour, M.D, DABA, DABAPM, DABPM, DABIPP, FIPP  Disclaimer:  Medicine is not an exact science. The only guarantee in medicine is that nothing is guaranteed. It is important to note that the decision to proceed with this intervention was based on the information collected from the patient. The Data and conclusions were drawn from the patient's questionnaire, the interview, and the physical examination. Because the information was provided in large part by the patient, it cannot be guaranteed that it has not been purposely or unconsciously manipulated. Every effort has been made to obtain as much relevant data as possible for this evaluation. It is important to note that the conclusions that lead to this procedure are derived in large part from the available data. Always take into account that the treatment will also be dependent on availability of resources and existing treatment guidelines, considered by other Pain Management Practitioners as being common knowledge and practice, at the time of the intervention. For Medico-Legal purposes, it is also important to point out that variation in procedural techniques and pharmacological choices are the acceptable norm. The indications, contraindications, technique, and results of the above procedure should only be interpreted and judged by a Board-Certified Interventional Pain Specialist with extensive familiarity and expertise in the same exact procedure and technique. Attempts at providing opinions without similar or greater experience and expertise than that of the treating physician will be considered as inappropriate and unethical, and shall result in a formal complaint to the state medical board and applicable specialty societies.

## 2015-01-11 NOTE — Progress Notes (Signed)
Safety precautions to be maintained throughout the outpatient stay will include: orient to surroundings, keep bed in low position, maintain call bell within reach at all times, provide assistance with transfer out of bed and ambulation.  

## 2015-01-11 NOTE — Patient Instructions (Signed)
Epidural Steroid Injection An epidural steroid injection is given to relieve pain in your neck, back, or legs that is caused by the irritation or swelling of a nerve root. This procedure involves injecting a steroid and numbing medicine (anesthetic) into the epidural space. The epidural space is the space between the outer covering of your spinal cord and the bones that form your backbone (vertebra).  LET YOUR HEALTH CARE PROVIDER KNOW ABOUT:   Any allergies you have.  All medicines you are taking, including vitamins, herbs, eye drops, creams, and over-the-counter medicines such as aspirin.  Previous problems you or members of your family have had with the use of anesthetics.  Any blood disorders or blood clotting disorders you have.  Previous surgeries you have had.  Medical conditions you have. RISKS AND COMPLICATIONS Generally, this is a safe procedure. However, as with any procedure, complications can occur. Possible complications of epidural steroid injection include:  Headache.  Bleeding.  Infection.  Allergic reaction to the medicines.  Damage to your nerves. The response to this procedure depends on the underlying cause of the pain and its duration. People who have long-term (chronic) pain are less likely to benefit from epidural steroids than are those people whose pain comes on strong and suddenly. BEFORE THE PROCEDURE   Ask your health care provider about changing or stopping your regular medicines. You may be advised to stop taking blood-thinning medicines a few days before the procedure.  You may be given medicines to reduce anxiety.  Arrange for someone to take you home after the procedure. PROCEDURE   You will remain awake during the procedure. You may receive medicine to make you relaxed.  You will be asked to lie on your stomach.  The injection site will be cleaned.  The injection site will be numbed with a medicine (local anesthetic).  A needle will be  injected through your skin into the epidural space.  Your health care provider will use an X-ray machine to ensure that the steroid is delivered closest to the affected nerve. You may have minimal discomfort at this time.  Once the needle is in the right position, the local anesthetic and the steroid will be injected into the epidural space.  The needle will then be removed and a bandage will be applied to the injection site. AFTER THE PROCEDURE   You may be monitored for a short time before you go home.  You may feel weakness or numbness in your arm or leg, which disappears within hours.  You may be allowed to eat, drink, and take your regular medicine.  You may have soreness at the site of the injection.   This information is not intended to replace advice given to you by your health care provider. Make sure you discuss any questions you have with your health care provider.   Document Released: 04/22/2007 Document Revised: 09/15/2012 Document Reviewed: 07/02/2012 Elsevier Interactive Patient Education 2016 [employer] Pain Management Discharge Instructions  General Discharge Instructions :  If you need to reach your doctor call: Monday-Friday 8:00 am - 4:00 pm at [phone] or toll free [phone].  After clinic hours [phone] to have operator reach doctor.  Bring all of your medication bottles to all your appointments in the pain clinic.  To cancel or reschedule your appointment with Pain Management please remember to call 24 hours in advance to avoid a fee.  Refer to the educational materials which you have been given on: General Risks, I had my Procedure.   Discharge Instructions, Post Sedation.  Post Procedure Instructions:  The drugs you were given will stay in your system until tomorrow, so for the next 24 hours you should not drive, make any legal decisions or drink any alcoholic beverages.  You may eat anything you prefer, but it is better to start with  liquids then soups and crackers, and gradually work up to solid foods.  Please notify your doctor immediately if you have any unusual bleeding, trouble breathing or pain that is not related to your normal pain.  Depending on the type of procedure that was done, some parts of your body may feel week and/or numb.  This usually clears up by tonight or the next day.  Walk with the use of an assistive device or accompanied by an adult for the 24 hours.  You may use ice on the affected area for the first 24 hours.  Put ice in a Ziploc bag and cover with a towel and place against area 15 minutes on 15 minutes off.  You may switch to heat after 24 hours. 

## 2015-01-12 ENCOUNTER — Telehealth: Payer: Self-pay | Admitting: *Deleted

## 2015-01-12 NOTE — Telephone Encounter (Signed)
Message left

## 2015-01-25 ENCOUNTER — Ambulatory Visit: Payer: 59 | Attending: Pain Medicine | Admitting: Pain Medicine

## 2015-01-25 ENCOUNTER — Other Ambulatory Visit: Payer: Self-pay | Admitting: Pain Medicine

## 2015-01-25 VITALS — BP 141/69 | HR 63 | Temp 99.3°F | Resp 16 | Ht 62.0 in | Wt 123.0 lb

## 2015-01-25 DIAGNOSIS — M533 Sacrococcygeal disorders, not elsewhere classified: Secondary | ICD-10-CM | POA: Diagnosis not present

## 2015-01-25 DIAGNOSIS — G8929 Other chronic pain: Secondary | ICD-10-CM

## 2015-01-25 DIAGNOSIS — G43909 Migraine, unspecified, not intractable, without status migrainosus: Secondary | ICD-10-CM | POA: Insufficient documentation

## 2015-01-25 DIAGNOSIS — E0590 Thyrotoxicosis, unspecified without thyrotoxic crisis or storm: Secondary | ICD-10-CM | POA: Diagnosis not present

## 2015-01-25 DIAGNOSIS — M25552 Pain in left hip: Secondary | ICD-10-CM | POA: Diagnosis not present

## 2015-01-25 DIAGNOSIS — I6529 Occlusion and stenosis of unspecified carotid artery: Secondary | ICD-10-CM | POA: Insufficient documentation

## 2015-01-25 DIAGNOSIS — M48061 Spinal stenosis, lumbar region without neurogenic claudication: Secondary | ICD-10-CM

## 2015-01-25 DIAGNOSIS — M25559 Pain in unspecified hip: Secondary | ICD-10-CM | POA: Diagnosis present

## 2015-01-25 DIAGNOSIS — M1612 Unilateral primary osteoarthritis, left hip: Secondary | ICD-10-CM | POA: Insufficient documentation

## 2015-01-25 DIAGNOSIS — M5416 Radiculopathy, lumbar region: Secondary | ICD-10-CM | POA: Diagnosis not present

## 2015-01-25 DIAGNOSIS — K219 Gastro-esophageal reflux disease without esophagitis: Secondary | ICD-10-CM | POA: Diagnosis not present

## 2015-01-25 DIAGNOSIS — M47896 Other spondylosis, lumbar region: Secondary | ICD-10-CM

## 2015-01-25 DIAGNOSIS — M47816 Spondylosis without myelopathy or radiculopathy, lumbar region: Secondary | ICD-10-CM

## 2015-01-25 DIAGNOSIS — M4806 Spinal stenosis, lumbar region: Secondary | ICD-10-CM | POA: Insufficient documentation

## 2015-01-25 DIAGNOSIS — M47818 Spondylosis without myelopathy or radiculopathy, sacral and sacrococcygeal region: Secondary | ICD-10-CM | POA: Diagnosis not present

## 2015-01-25 DIAGNOSIS — F419 Anxiety disorder, unspecified: Secondary | ICD-10-CM | POA: Diagnosis not present

## 2015-01-25 DIAGNOSIS — M545 Low back pain: Secondary | ICD-10-CM | POA: Diagnosis not present

## 2015-01-25 MED ORDER — METHYLPREDNISOLONE ACETATE 80 MG/ML IJ SUSP
INTRAMUSCULAR | Status: AC
  Administered 2015-01-25: 14:00:00
  Filled 2015-01-25: qty 1

## 2015-01-25 MED ORDER — ROPIVACAINE HCL 2 MG/ML IJ SOLN
INTRAMUSCULAR | Status: AC
  Administered 2015-01-25: 14:00:00
  Filled 2015-01-25: qty 10

## 2015-01-25 MED ORDER — METHYLPREDNISOLONE ACETATE 80 MG/ML IJ SUSP: 80.0000 mg | Freq: Once | INTRAMUSCULAR | Status: DC

## 2015-01-25 MED ORDER — ROPIVACAINE HCL 2 MG/ML IJ SOLN: 5.0000 mL | Freq: Once | INTRAMUSCULAR | Status: DC

## 2015-01-25 MED ORDER — LIDOCAINE HCL (PF) 1 % IJ SOLN: 10.0000 mL | Freq: Once | INTRAMUSCULAR | Status: DC

## 2015-01-25 NOTE — Patient Instructions (Signed)

## 2015-01-25 NOTE — Progress Notes (Signed)
Safety precautions to be maintained throughout the outpatient stay will include: orient to surroundings, keep bed in low position, maintain call bell within reach at all times, provide assistance with transfer out of bed and ambulation.  

## 2015-01-25 NOTE — Progress Notes (Signed)
Patient's Name: Shannon [name] MRN: [id number] DOB: 1965-07-28 DOS: 01/25/2015  Primary Reason(s) for Visit: Post-Procedure evaluation CC: Pain and Hip Pain   HPI:    Shannon [name] is a 49 y.o. year old, female patient, who returns today as an established patient. She has Hyperthyroidism; GERD (gastroesophageal reflux disease); Anemia; Migraine; Menopausal symptoms; Carotid stenosis; Health care maintenance; Anxiety; Atypical chest pain; Abdominal pain, epigastric; Gastro-esophageal reflux disease without esophagitis; Chronic pain; Chronic low back pain (Left); Lumbar lateral recess stenosis (L4-5) (Left); Lumbar facet hypertrophy (L4-5) (Left); Chronic lumbar radicular pain (Left) (L4 Dermatome); Chronic left hip pain (intermittent); Chronic sacroiliac joint pain (intermittent) (Left); Nicotine dependence; Tobacco abuse; Osteoarthritis of hip (Left); Lumbar spondylosis; Encounter for chronic pain management; and Lumbar radiculopathy, acute (Left) on her problem list.. Her primarily concern today is the Pain and Hip Pain     The patient returns to the clinic today after having had a left-sided L4-5 lumbar epidural steroid injection under fluoroscopic guidance, without sedation. The patient has a known history of lumbar facet hypertrophy on the left side at the L4-5 level causing left lateral recess stenosis with occasional radicular symptoms. Today I talked to the patient about the possibility of this getting worse with time and that she may need to see a neurosurgeon for this. She indicated that she wants to wait until this next year to do that. After they have lumbar epidural steroid injection, the only pain that remains is in her left lower back which upon today's physical exam this seems to be coming from the SI joint. The patient has requested that we inject that one today, if possible.  Today's Pain Score: 3  Reported level of pain is compatible with clinical observation Pain Type: Chronic  pain Pain Location: Hip Pain Orientation: Left Pain Descriptors / Indicators: Sharp Pain Frequency: Intermittent  Date of Last Visit: 01/11/15 Service Provided on Last Visit: Procedure  Pharmacotherapy Review: The patient's medication management is currently not under our care Side-effects or Adverse reactions: None reported Effectiveness: Described as relatively effective, allowing for increase in activities of daily living (ADL) Onset of action: Within expected pharmacological parameters Duration of action: Within normal limits for medication Peak effect: Timing and results are as within normal expected parameters Substance Use Disorder (SUD) Risk Level: Low  Lab Work: Inflammation Markers No results found for: ESRSEDRATE, CRP  Renal Function Lab Results  Component Value Date   BUN 10 08/30/2014   CREATININE 0.62 08/30/2014   GFRAA >60 05/02/2014   GFRNONAA >60 05/02/2014    Hepatic Function Lab Results  Component Value Date   AST 19 08/30/2014   ALT 18 08/30/2014   ALBUMIN 4.0 08/30/2014    Electrolytes Lab Results  Component Value Date   NA 143 08/30/2014   K 4.0 08/30/2014   CL 107 08/30/2014   CALCIUM 9.4 08/30/2014    Procedure Assessment:   Procedure done on last visit: Left L4-5 lumbar epidural steroid injection under fluoroscopic guidance, without sedation. Side-effects or Adverse reactions: None reported Sedation: No sedation used during procedure  Results: Ultra-Short Term Relief (First 1 hour after procedure): 100 %  Possibly the results is influenced by the pharmacodynamic effect of the local anesthetic, as well as that of the intravenous analgesics and/or sedatives, when used Short Term Relief (Initial 4-6 hrs after procedure): 100 % Short-term relief confirms injected site to be the source of pain Long Term Relief : 95 % Long-term benefit would suggest an inflammatory etiology to the pain  Current Relief (Now):  95%  Persistent relief would  suggest effective anti-inflammatory effects from steroids Interpretation of Results: The left leg pain is completely gone the only thing that remains is pain in the left lower back which on today's physical exam is positive for a Patrick maneuver suggesting SI joint etiology.  Allergies:  Shannon [name] is allergic to codeine; decongest-aid; flagyl; and sulfa antibiotics.  Meds:  The patient has a current medication list which includes the following prescription(s): acetaminophen, alprazolam, omeprazole, ranitidine, and tandem plus. Requested Prescriptions    No prescriptions requested or ordered in this encounter    ROS:  Constitutional: Afebrile, no chills, well hydrated and well nourished Gastrointestinal: negative Musculoskeletal:negative Neurological: negative Behavioral/Psych: negative  PFSH:  Medical:  Shannon [name]  has a past medical history of Hyperthyroidism; GERD (gastroesophageal reflux disease); History of migraine headaches; Anemia; Environmental allergies; Hiatal hernia; Breast pain, right (11/07/2012); Vaginitis (01/16/2013); and Breathing difficulty (06/20/2014). Family: family history includes Breast cancer in her mother; Heart disease in her father and maternal grandfather; Hypercholesterolemia in her father and mother; Hypertension in her father; Kidney cancer in her father; Leukemia in her father; Lung cancer in her paternal grandfather; Pancreatic cancer in her paternal grandfather; Prostate cancer in her paternal grandfather; Thyroid disease in her father and mother. Surgical:  has past surgical history that includes Septoplasty (5/90); Nasal sinus surgery (11/93); Cesarean section (11/98); Tubal ligation (02/1998); Umbilical hernia repair (02/1998); and Cholecystectomy (11/05). Tobacco:  reports that she has quit smoking. She has never used smokeless tobacco. Alcohol:  reports that she does not drink alcohol. Drug:  reports that she does not use illicit drugs.  Physical  Exam:  Vitals:  Today's Vitals   01/25/15 1318 01/25/15 1322  BP: 132/82   Pulse: 64   Temp: 99.3 F (37.4 C)   TempSrc: Oral   Resp: 16   Height: '5\' 2"'  (1.575 m)   Weight: 123 lb (55.792 kg)   SpO2: 100%   PainSc: 3  3   Calculated BMI: Body mass index is 22.49 kg/(m^2). General appearance: alert, cooperative, appears stated age and no distress Eyes: PERLA Respiratory: No evidence respiratory distress, no audible rales or ronchi and no use of accessory muscles of respiration Neck: no adenopathy, no carotid bruit, no JVD, supple, symmetrical, trachea midline and thyroid not enlarged, symmetric, no tenderness/mass/nodules  Cervical Spine ROM: Adequate for flexion, extension, rotation, and lateral bending Palpation: No palpable trigger points  Upper Extremities ROM: Adequate bilaterally Strength: 5/5 for all flexors and extensors of the upper extremity, bilaterally Pulses: Palpable bilaterally Neurologic: No allodynia, No hyperesthesia, No hyperpathia and No sensory abnormalities  Lumbar Spine ROM: Adequate for flexion, extension, rotation, and lateral bending Palpation: No palpable trigger points Lumbar Hyperextension and rotation: Non-contributory Patrick's Maneuver: Left side positive  Lower Extremities ROM: Adequate bilaterally Strength: 5/5 for all flexors and extensors of the lower extremity, bilaterally Pulses: Palpable bilaterally Neurologic: No allodynia, No hyperesthesia, No hyperpathia, No sensory abnormalities and No antalgic gait or posture   Procedure:  Type: Palliative Sacroiliac Joint Block Region: Lumbosacral Area. Level: Upper Sacroiliac Joint Level Laterality: Left  Indications: Spondylosis, Sacral and Sacrococcygeal Region Lumbosacral Spine Pain Sacroiliac Joint Pain Low Back Pain  Consent: Secured. Under the influence of no sedatives a written informed consent was obtained, after having provided information on the risks and possible  complications. To fulfill our ethical and legal obligations, as recommended by the American Medical Association's Code of Ethics, we have provided information to the patient  about our clinical impression; the nature and purpose of the treatment or procedure; the risks, benefits, and possible complications of the intervention; alternatives; the risk(s) and benefit(s) of the alternative treatment(s) or procedure(s); and the risk(s) and benefit(s) of doing nothing. The patient was provided information about the risks and possible complications associated with the procedure. These include, but are not limited to, failure to achieve desired goals, infection, bleeding, organ or nerve damage, allergic reactions, paralysis, and death. In the case of intra- or periarticular procedures these may include, but are not limited to, failure to achieve desired goals, infection, bleeding (hemarthrosis), organ or nerve damage, allergic reactions, and death. In addition, the patient was informed that Medicine is not an exact science; therefore, there is also the possibility of unforeseen risks and possible complications that may result in a catastrophic outcome. The patient indicated having understood very clearly. We have given the patient no guarantees and we have made no promises. Enough time was given to the patient to ask questions, all of which were answered to the patient's satisfaction.  Pre-Procedure Preparation: Safety Precautions: Allergies reviewed. Appropriate site, procedure, and patient were confirmed by following the Joint Commission's Universal Protocol (UP.01.01.01), in the form of a "Time Out". The patient was asked to confirm marked site and procedure, before commencing. The patient was asked about blood thinners, or active infections, both of which were denied. Patient was assessed for positional comfort and all pressure points were checked before starting procedure. Monitoring:  As per clinic  protocol. Infection Control Precautions: Sterile technique used. Standard Universal Precautions were taken as recommended by the Department of [facility] for Disease Control and Prevention (CDC). Standard pre-surgical skin prep was conducted. Respiratory hygiene and cough etiquette was practiced. Hand hygiene observed. Safe injection practices and needle disposal techniques followed. SDV (single dose vial) medications used. Medications properly checked for expiration dates and contaminants. Personal protective equipment (PPE) used: Radiation resistant gloves. Sterile surgical gloves.  Anesthesia, Analgesia, Anxiolysis: Type: Local Anesthesia Local Anesthetic(s): Lidocaine 1% Route: Subcutaneous IV Access: Declined. Sedation: Declined. Indication(s): Analgesia.  Description of Procedure Process:  Time-out: "Time-out" completed before starting procedure, as per protocol. Position: Prone Target Area: For upper sacroiliac joint block(s), the target is the superior and posterior margin of the sacroiliac joint. Approach: Posterior, Paraspinal and Ipsilateral approach. Area Prepped: Entire Lower Lumbosacral Region Prepping solution: ChloraPrep (2% chlorhexidine gluconate and 70% isopropyl alcohol) Safety Precautions: Aspiration looking for blood return was conducted prior to all injections. At no point did we inject any substances, as a needle was being advanced. No attempts were made at seeking any paresthesias. Safe injection practices and needle disposal techniques used. Medications properly checked for expiration dates. SDV (single dose vial) medications used. Description of the Procedure: Protocol guidelines were followed. The patient was placed in position over the procedure table. The target area was identified and the area prepped in the usual manner. Skin & deeper tissues infiltrated with local anesthetic. Appropriate amount of time allowed to pass for local anesthetics to take  effect. The procedure needle was advanced under fluoroscopic guidance into the sacroiliac joint until a firm endpoint was obtained. Proper needle placement secured. Negative aspiration confirmed. Solution injected in intermittent fashion, asking for systemic symptoms every 0.5cc of injectate. The needles were then removed and the area cleansed, making sure to leave some of the prepping solution back to take advantage of its long term bactericidal properties. EBL: None Materials & Medications Used:  Needle(s) Used: 22g - 3.5" Spinal  Needle Solution Injected: 0.2% PF-Ropivacaine (47m) + SDV-DepoMedrol 80 mg/ml (169m Medications Administered today: We administered ropivacaine (PF) 2 mg/ml (0.2%) and methylPREDNISolone acetate.Please see chart orders for dosing details.  Imaging Guidance:  Type of Imaging Technique: Fluoroscopy Guidance (Non-spinal) Indication(s): Assistance in needle guidance and placement for procedures requiring needle placement in or near specific anatomical locations not easily accessible without such assistance. Exposure Time: Please see nurses notes. Contrast: None used. Fluoroscopic Guidance: I was personally present in the fluoroscopy suite, where the patient was placed in position for the procedure, over the fluoroscopy-compatible table. Fluoroscopy was manipulated, using "Tunnel Vision Technique", to obtain the best possible view of the target area, on the affected side. Parallax error was corrected before commencing the procedure. A "direction-depth-direction" technique was used to introduce the needle under continuous pulsed fluoroscopic guidance. Once the target was reached, antero-posterior, oblique, and lateral fluoroscopic projection views were taken to confirm needle placement in all planes. Permanently recorded images stored by scanning into EMR. Interpretation: No contrast injected. Intraoperative imaging interpretation by performing Physician. Adequate needle placement  confirmed. Needle placement confirmed in AP, lateral, & Oblique Views. Permanent hardcopy images in multiple planes scanned into the patient's record.  Antibiotics:  Type:  Antibiotics Given (last 72 hours)    None      Indication(s): No indications identified.  Post-operative Assessment:  Complications: No immediate post-procedure complications observed. Disposition: PRN return. The patient will call. The patient was discharged home, once institutional criteria were met. Return to clinic in 2 weeks for follow-up evaluation and interpretation of results. The patient tolerated the entire procedure well. A repeat set of vitals were taken after the procedure and the patient was kept under observation following institutional policy, for this type of procedure. Post-procedural neurological assessment was performed, showing return to baseline, prior to discharge. The patient was provided with post-procedure discharge instructions, including a section on how to identify potential problems. Should any problems arise concerning this procedure, the patient was given instructions to immediately contact us[place]at any time, without hesitation. In any case, we plan to contact the patient by telephone for a follow-up status report regarding this interventional procedure. Comments:  No additional relevant information.  Assessment:  Encounter Diagnosis:  Primary Diagnosis: Chronic left sacroiliac joint pain [M53.3, G89.29]  Plan:   Interventional Therapies: PRN procedure: 1. For hip pain, left intra-articular hip injection. Under fluoroscopic guidance, no sedation. 2. For SI joint pain, left sacroiliac joint block. Under fluoroscopic guidance, no sedation. 3. For left lower extremity pain going all the way down into the foot, left L4-5 lumbar epidural steroid injection. Under fluoroscopic guidance, no sedation.   JeMadelonas seen today for pain and hip pain.  Diagnoses and all orders for this  visit:  Chronic sacroiliac joint pain (intermittent) (Left) -     SACROILIAC JOINT INJECTINS; Standing  Chronic lumbar radicular pain (Left) (L4 Dermatome) -     LUMBAR EPIDURAL STEROID INJECTION; Standing  Lumbar facet hypertrophy (L4-5) (Left)  Lumbar lateral recess stenosis (L4-5) (Left)  Chronic left hip pain (intermittent) -     HIP INJECTION; Standing     There are no Patient Instructions on file for this visit. Medications discontinued today:  Medications Discontinued During This Encounter  Medication Reason  . iohexol (OMNIPAQUE) 180 MG/ML injection 10 mL Error  . lidocaine (PF) (XYLOCAINE) 1 % injection 10 mL Error  . ropivacaine (PF) 2 mg/ml (0.2%) (NAROPIN) epidural 2 mL Error  . sodium chloride 0.9 % injection 2 mL  Error  . triamcinolone acetonide (KENALOG-40) injection 40 mg Error   Medications administered today:  Ms. Neville had no medications administered during this visit.  Primary Care Physician: [name], MD Location: [facility] Note by: Kathlen Brunswick. Dossie Arbour, M.D, DABA, DABAPM, DABPM, DABIPP, FIPP

## 2015-01-28 HISTORY — PX: JOINT REPLACEMENT: SHX530

## 2015-02-01 ENCOUNTER — Other Ambulatory Visit: Payer: Self-pay | Admitting: Internal Medicine

## 2015-02-01 NOTE — Telephone Encounter (Signed)
Please advise refill, last appt with you was in July. Thanks

## 2015-02-01 NOTE — Telephone Encounter (Signed)
ok'd refill for xanax #30 with one refill.   

## 2015-02-04 ENCOUNTER — Encounter: Payer: Self-pay | Admitting: Internal Medicine

## 2015-02-04 DIAGNOSIS — N950 Postmenopausal bleeding: Secondary | ICD-10-CM

## 2015-02-05 NOTE — Telephone Encounter (Signed)
See other my chart message.  Order placed for referral to Dr Ouida Sills (per pt request).

## 2015-02-05 NOTE — Telephone Encounter (Signed)
Order placed for gyn referral.  Pt notified via my chart.   

## 2015-02-08 ENCOUNTER — Ambulatory Visit: Payer: 59 | Admitting: Pain Medicine

## 2015-02-19 ENCOUNTER — Encounter: Payer: Self-pay | Admitting: Internal Medicine

## 2015-02-19 ENCOUNTER — Ambulatory Visit (INDEPENDENT_AMBULATORY_CARE_PROVIDER_SITE_OTHER): Payer: 59 | Admitting: Internal Medicine

## 2015-02-19 VITALS — BP 106/60 | HR 88 | Temp 98.6°F | Resp 18 | Ht 62.0 in | Wt 128.0 lb

## 2015-02-19 DIAGNOSIS — K219 Gastro-esophageal reflux disease without esophagitis: Secondary | ICD-10-CM | POA: Diagnosis not present

## 2015-02-19 DIAGNOSIS — M48061 Spinal stenosis, lumbar region without neurogenic claudication: Secondary | ICD-10-CM

## 2015-02-19 DIAGNOSIS — G8929 Other chronic pain: Secondary | ICD-10-CM

## 2015-02-19 DIAGNOSIS — M1612 Unilateral primary osteoarthritis, left hip: Secondary | ICD-10-CM

## 2015-02-19 DIAGNOSIS — E0590 Thyrotoxicosis, unspecified without thyrotoxic crisis or storm: Secondary | ICD-10-CM

## 2015-02-19 DIAGNOSIS — M4806 Spinal stenosis, lumbar region: Secondary | ICD-10-CM

## 2015-02-19 DIAGNOSIS — M25552 Pain in left hip: Secondary | ICD-10-CM | POA: Diagnosis not present

## 2015-02-19 DIAGNOSIS — D649 Anemia, unspecified: Secondary | ICD-10-CM

## 2015-02-19 DIAGNOSIS — N950 Postmenopausal bleeding: Secondary | ICD-10-CM

## 2015-02-19 DIAGNOSIS — M533 Sacrococcygeal disorders, not elsewhere classified: Secondary | ICD-10-CM

## 2015-02-19 DIAGNOSIS — F419 Anxiety disorder, unspecified: Secondary | ICD-10-CM

## 2015-02-19 MED ORDER — ONDANSETRON HCL 4 MG PO TABS: 4.0000 mg | ORAL_TABLET | Freq: Two times a day (BID) | ORAL | Status: DC | PRN

## 2015-02-19 NOTE — Progress Notes (Signed)
Pre-visit discussion using our clinic review tool. No additional management support is needed unless otherwise documented below in the visit note.  

## 2015-02-19 NOTE — Progress Notes (Signed)
Patient ID: ABIGEL KARST, female   DOB: 02-15-1965, 50 y.o.   MRN: [id number]   Subjective:    Patient ID: VEDIKA ALVARENGA, female    DOB: [birth year], 50 y.o.   MRN: [id number]  HPI  Patient with past history of hyperthyroidism s/p ablation, GERD and previous anemia.  She comes in today to follow up on these issues.  She had post menopausal bleeding recently.  Saw Dr Ouida Sills and is s/p endometrial biopsy 02/15/15.  No results yet.  No chest pain or tightness.  No sob.  No acid reflux on her current regimen.  No abdominal pain or cramping.  Some gas occasionally.  Bowels stable.  Increased stress with her daughter going to college and playing travel ball.  Overall handling things relatively well.  Has been having back pain and left hip pain.  Sees Dr Consuela Mimes.  S/p injection in her back and in her SI joint.  Helped and are better.  Left hip - no better.  States needs a hip replacement.  Plans to f/u with Dr Okey Dupre at [facility] for this.  Wants to postpone for now.     Past Medical History  Diagnosis Date  . Hyperthyroidism     s/p ablation  . GERD (gastroesophageal reflux disease)   . History of migraine headaches   . Anemia   . Environmental allergies   . Hiatal hernia     small  . Breast pain, right 11/07/2012  . Vaginitis 01/16/2013  . Breathing difficulty 06/20/2014   Past Surgical History  Procedure Laterality Date  . Septoplasty  5/90    Dr Rossie Muskrat  . Nasal sinus surgery  11/93  . Cesarean section  11/98    Dr Roena Malady  . Tubal ligation  02/1998  . Umbilical hernia repair  02/1998  . Cholecystectomy  11/05    Dr Pat Patrick   Family History  Problem Relation Age of Onset  . Heart disease Father   . Hypertension Father   . Thyroid disease Father   . Hypercholesterolemia Father   . Kidney cancer Father   . Leukemia Father     hairy cell  . Hypercholesterolemia Mother   . Thyroid disease Mother   . Breast cancer Mother   . Breast cancer      maternal great grandmother    . Heart disease Maternal Grandfather     MI - age 36  . Prostate cancer Paternal Grandfather   . Pancreatic cancer Paternal Grandfather   . Lung cancer Paternal Grandfather    Social History   Social History  . Marital Status: Married    Spouse Name: N/A  . Number of Children: 1  . Years of Education: N/A   Social History Main Topics  . Smoking status: Former Research scientist (life sciences)  . Smokeless tobacco: Never Used  . Alcohol Use: No  . Drug Use: No  . Sexual Activity: Not Asked   Other Topics Concern  . None   Social History Narrative    Outpatient Encounter Prescriptions as of 02/19/2015  Medication Sig  . acetaminophen (TYLENOL) 500 MG tablet Take 1,000 mg by mouth every 6 (six) hours as needed for moderate pain.  Marland Kitchen ALPRAZolam (XANAX) 0.25 MG tablet TAKE ONE TABLET BY MOUTH AT BEDTIME AS NEEDED FOR ANXIETY  . omeprazole (PRILOSEC) 40 MG capsule Take 40 mg by mouth daily.  . ranitidine (ZANTAC) 150 MG tablet Take 150 mg by mouth at bedtime.  . [DISCONTINUED] [id number]-[id number]-B Cmp-C-Zn-Mn-Cu (TANDEM PLUS) [id number]  MG CAPS Reported on 01/25/2015  . ondansetron (ZOFRAN) 4 MG tablet Take 1 tablet (4 mg total) by mouth 2 (two) times daily as needed for nausea or vomiting.   Facility-Administered Encounter Medications as of 02/19/2015  Medication  . lidocaine (PF) (XYLOCAINE) 1 % injection 10 mL  . methylPREDNISolone acetate (DEPO-MEDROL) injection 80 mg  . ropivacaine (PF) 2 mg/ml (0.2%) (NAROPIN) epidural 5 mL    Review of Systems  Constitutional: Negative for appetite change and unexpected weight change.  HENT: Negative for congestion and sinus pressure.   Eyes: Negative for discharge and redness.  Respiratory: Negative for cough, chest tightness and shortness of breath.   Cardiovascular: Negative for chest pain, palpitations and leg swelling.  Gastrointestinal: Negative for nausea, vomiting, abdominal pain and diarrhea.  Genitourinary: Negative for dysuria and difficulty urinating.   Musculoskeletal: Positive for back pain.       Hip pain as outlined.    Skin: Negative for color change and rash.  Neurological: Negative for dizziness, light-headedness and headaches.  Psychiatric/Behavioral: Negative for dysphoric mood and agitation.       Objective:     Blood pressure rechecked by me:  118/68  Physical Exam  Constitutional: She appears well-developed and well-nourished. No distress.  HENT:  Nose: Nose normal.  Mouth/Throat: Oropharynx is clear and moist.  Eyes: Conjunctivae are normal. Right eye exhibits no discharge. Left eye exhibits no discharge.  Neck: Neck supple. No thyromegaly present.  Cardiovascular: Normal rate and regular rhythm.   Pulmonary/Chest: Breath sounds normal. No respiratory distress. She has no wheezes.  Abdominal: Soft. Bowel sounds are normal. There is no tenderness.  Musculoskeletal: She exhibits no edema or tenderness.  Lymphadenopathy:    She has no cervical adenopathy.  Skin: No rash noted. No erythema.  Psychiatric: She has a normal mood and affect. Her behavior is normal.    BP 106/60 mmHg  Pulse 88  Temp(Src) 98.6 F (37 C) (Oral)  Resp 18  Ht 5\' 2"  (1.575 m)  Wt 128 lb (58.06 kg)  BMI 23.41 kg/m2  SpO2 95%  LMP 02/12/2013 Wt Readings from Last 3 Encounters:  02/19/15 128 lb (58.06 kg)  01/25/15 123 lb (55.792 kg)  01/11/15 123 lb (55.792 kg)     Lab Results  Component Value Date   WBC 4.9 08/30/2014   HGB 13.0 08/30/2014   HCT 38.1 08/30/2014   PLT 210.0 08/30/2014   GLUCOSE 75 08/30/2014   CHOL 179 08/30/2014   TRIG 129.0 08/30/2014   HDL 63.70 08/30/2014   LDLCALC 90 08/30/2014   ALT 18 08/30/2014   AST 19 08/30/2014   NA 143 08/30/2014   K 4.0 08/30/2014   CL 107 08/30/2014   CREATININE 0.62 08/30/2014   BUN 10 08/30/2014   CO2 29 08/30/2014   TSH 1.92 08/30/2014    Mm Digital Screening Bilateral  11/13/2014  CLINICAL DATA:  Screening. EXAM: DIGITAL SCREENING BILATERAL MAMMOGRAM WITH CAD  COMPARISON:  Previous exam(s). ACR Breast Density Category d: The breast tissue is extremely dense, which lowers the sensitivity of mammography. FINDINGS: There are no findings suspicious for malignancy. Images were processed with CAD. IMPRESSION: No mammographic evidence of malignancy. A result letter of this screening mammogram will be mailed directly to the patient. RECOMMENDATION: Screening mammogram in one year. (Code:SM-B-01Y) BI-RADS CATEGORY  1: Negative. Electronically Signed   By: Lovey Newcomer M.D.   On: 11/13/2014 11:36       Assessment & Plan:   Problem List Items Addressed  This Visit    Anemia    Colonoscopy 12/08/14 - diverticulosis.  Saw Dr Clydene Laming.  Follow cbc.  Recommend f/u colonoscopy in 10 years.       Relevant Orders   CBC with Differential/Platelet   Ferritin   Anxiety    Discussed with her today.  Xanax prn.  Does not feel needs any further intervention.  Follow.        Relevant Orders   Comprehensive metabolic panel   Chronic left hip pain (intermittent) - Primary (Chronic)    States has bone on bone.  Injection only helped for two weeks.  States needs a hip replacement.  Wants to post pone.  Follow.  Continues f/u at pain clinic.        Chronic sacroiliac joint pain (intermittent) (Left) (Chronic)    S/p injection.  Doing better.        GERD (gastroesophageal reflux disease)    On prilosec and zantac.  Doing well.  Saw GI - Dr Clydene Laming.  See his note for details.  No acid reflux.  Follow.       Relevant Medications   ondansetron (ZOFRAN) 4 MG tablet   Hyperthyroidism    S/p ablation.  Follow tsh.       Relevant Orders   TSH   Lumbar lateral recess stenosis (L4-5) (Left) (Chronic)    S/p injection.  Doing better.  Follow.        Osteoarthritis of hip (Left) (Chronic)    Persistent pain.  States has bone on bone.  Was told needed hip replacement.  Plans to f/u with ortho Dr Okey Dupre in the future.        Post-menopausal bleeding    Saw Dr Ouida Sills.   S/p endometrial biopsy.  Awaiting pathology results.            [name], MD

## 2015-02-19 NOTE — Assessment & Plan Note (Signed)
States has bone on bone.  Injection only helped for two weeks.  States needs a hip replacement.  Wants to post pone.  Follow.  Continues f/u at pain clinic.

## 2015-02-20 ENCOUNTER — Encounter: Payer: Self-pay | Admitting: Internal Medicine

## 2015-02-20 DIAGNOSIS — N950 Postmenopausal bleeding: Secondary | ICD-10-CM | POA: Insufficient documentation

## 2015-02-20 NOTE — Assessment & Plan Note (Signed)
S/p ablation.  Follow tsh.

## 2015-02-20 NOTE — Assessment & Plan Note (Addendum)
Colonoscopy 12/08/14 - diverticulosis.  Saw Dr Clydene Laming.  Follow cbc.  Recommend f/u colonoscopy in 10 years.

## 2015-02-20 NOTE — Assessment & Plan Note (Signed)
Saw Dr Ouida Sills.  S/p endometrial biopsy.  Awaiting pathology results.

## 2015-02-20 NOTE — Assessment & Plan Note (Signed)
S/p injection  Doing better

## 2015-02-20 NOTE — Assessment & Plan Note (Signed)
S/p injection.  Doing better.  Follow.   

## 2015-02-20 NOTE — Assessment & Plan Note (Signed)
Persistent pain.  States has bone on bone.  Was told needed hip replacement.  Plans to f/u with ortho Dr Okey Dupre in the future.

## 2015-02-20 NOTE — Assessment & Plan Note (Signed)
Discussed with her today.  Xanax prn.  Does not feel needs any further intervention.  Follow.

## 2015-02-20 NOTE — Assessment & Plan Note (Signed)
On prilosec and zantac.  Doing well.  Saw GI - Dr Clydene Laming.  See his note for details.  No acid reflux.  Follow.

## 2015-03-08 ENCOUNTER — Other Ambulatory Visit: Payer: Self-pay | Admitting: Internal Medicine

## 2015-03-08 NOTE — Telephone Encounter (Signed)
Pt requesting a refill on Xanax. Pt last OV 02/19/15, last filled 02/01/15 #30tabs with 1refill. Please advise, thanks

## 2015-03-09 NOTE — Telephone Encounter (Signed)
ok'd refill for xanax #30 with one refill.   

## 2015-03-22 ENCOUNTER — Other Ambulatory Visit (INDEPENDENT_AMBULATORY_CARE_PROVIDER_SITE_OTHER): Payer: 59

## 2015-03-22 DIAGNOSIS — D649 Anemia, unspecified: Secondary | ICD-10-CM | POA: Diagnosis not present

## 2015-03-22 DIAGNOSIS — E0590 Thyrotoxicosis, unspecified without thyrotoxic crisis or storm: Secondary | ICD-10-CM

## 2015-03-22 DIAGNOSIS — F419 Anxiety disorder, unspecified: Secondary | ICD-10-CM

## 2015-03-22 LAB — COMPREHENSIVE METABOLIC PANEL
ALK PHOS: 66 U/L (ref 39–117)
ALT: 15 U/L (ref 0–35)
AST: 17 U/L (ref 0–37)
Albumin: 4.4 g/dL (ref 3.5–5.2)
BILIRUBIN TOTAL: 0.4 mg/dL (ref 0.2–1.2)
BUN: 11 mg/dL (ref 6–23)
CALCIUM: 9.9 mg/dL (ref 8.4–10.5)
CO2: 27 meq/L (ref 19–32)
Chloride: 105 mEq/L (ref 96–112)
Creatinine, Ser: 0.74 mg/dL (ref 0.40–1.20)
GFR: 87.76 mL/min (ref >60.00)
Glucose, Bld: 100 mg/dL — ABNORMAL HIGH (ref 70–99)
Potassium: 3.9 mEq/L (ref 3.5–5.1)
Sodium: 140 mEq/L (ref 135–145)
TOTAL PROTEIN: 7 g/dL (ref 6.0–8.3)

## 2015-03-22 LAB — CBC WITH DIFFERENTIAL/PLATELET
Basophils Absolute: 0 10*3/uL (ref 0.0–0.1)
Basophils Relative: 0.7 % (ref 0.0–3.0)
Eosinophils Absolute: 0.1 10*3/uL (ref 0.0–0.7)
Eosinophils Relative: 1.9 % (ref 0.0–5.0)
HEMATOCRIT: 40.6 % (ref 36.0–46.0)
Hemoglobin: 13.8 g/dL (ref 12.0–15.0)
LYMPHS PCT: 27.6 % (ref 12.0–46.0)
Lymphs Abs: 1.4 10*3/uL (ref 0.7–4.0)
MCHC: 33.9 g/dL (ref 30.0–36.0)
MCV: 91.8 fl (ref 78.0–100.0)
MONOS PCT: 5.7 % (ref 3.0–12.0)
Monocytes Absolute: 0.3 10*3/uL (ref 0.1–1.0)
NEUTROS ABS: 3.3 10*3/uL (ref 1.4–7.7)
Neutrophils Relative %: 64.1 % (ref 43.0–77.0)
PLATELETS: 248 10*3/uL (ref 150.0–400.0)
RBC: 4.43 Mil/uL (ref 3.87–5.11)
RDW: 13.1 % (ref 11.5–15.5)
WBC: 5.1 10*3/uL (ref 4.0–10.5)

## 2015-03-22 LAB — TSH: TSH: 1.21 u[IU]/mL (ref 0.35–4.50)

## 2015-03-22 LAB — FERRITIN: Ferritin: 258.7 ng/mL (ref 10.0–291.0)

## 2015-03-23 ENCOUNTER — Encounter: Payer: Self-pay | Admitting: Internal Medicine

## 2015-03-28 NOTE — Telephone Encounter (Signed)
Unread mychart message mailed to patient 

## 2015-05-16 ENCOUNTER — Encounter: Payer: Self-pay | Admitting: Internal Medicine

## 2015-05-16 ENCOUNTER — Ambulatory Visit (INDEPENDENT_AMBULATORY_CARE_PROVIDER_SITE_OTHER): Payer: 59 | Admitting: Internal Medicine

## 2015-05-16 VITALS — BP 110/70 | HR 74 | Temp 98.5°F | Resp 18 | Ht 62.0 in | Wt 127.5 lb

## 2015-05-16 DIAGNOSIS — R2231 Localized swelling, mass and lump, right upper limb: Secondary | ICD-10-CM | POA: Diagnosis not present

## 2015-05-16 NOTE — Progress Notes (Signed)
Pre-visit discussion using our clinic review tool. No additional management support is needed unless otherwise documented below in the visit note.  

## 2015-05-16 NOTE — Progress Notes (Signed)
Patient ID: Shannon [name], female   DOB: 06-Oct-1965, 50 y.o.   MRN: [id number]   Subjective:    Patient ID: Shannon [name], female    DOB: 1965/10/03, 50 y.o.   MRN: [id number]  HPI  Patient here as a work in with concerns regarding a know under her right arm.  States she was moving and cleaning yard furniture yesterday.  Is sore from this.  Was massaging her arms and noticed a lump in right axilla.  Minimal tenderness with palpation.  No other knots or lumps.  She is concerned because of her family history of various cancers.  No fever.  No cough or congestion.  No abdominal pain or cramping.  Bowels stable.     Past Medical History  Diagnosis Date  . Hyperthyroidism     s/p ablation  . GERD (gastroesophageal reflux disease)   . History of migraine headaches   . Anemia   . Environmental allergies   . Hiatal hernia     small  . Breast pain, right 11/07/2012  . Vaginitis 01/16/2013  . Breathing difficulty 06/20/2014   Past Surgical History  Procedure Laterality Date  . Septoplasty  5/90    Dr Rossie Muskrat  . Nasal sinus surgery  11/93  . Cesarean section  11/98    Dr Roena Malady  . Tubal ligation  02/1998  . Umbilical hernia repair  02/1998  . Cholecystectomy  11/05    Dr Pat Patrick   Family History  Problem Relation Age of Onset  . Heart disease Father   . Hypertension Father   . Thyroid disease Father   . Hypercholesterolemia Father   . Kidney cancer Father   . Leukemia Father     hairy cell  . Hypercholesterolemia Mother   . Thyroid disease Mother   . Breast cancer Mother   . Breast cancer      maternal great grandmother  . Heart disease Maternal Grandfather     MI - age 21  . Prostate cancer Paternal Grandfather   . Pancreatic cancer Paternal Grandfather   . Lung cancer Paternal Grandfather    Social History   Social History  . Marital Status: Married    Spouse Name: N/A  . Number of Children: 1  . Years of Education: N/A   Social History Main Topics  . Smoking  status: Former Research scientist (life sciences)  . Smokeless tobacco: Never Used  . Alcohol Use: No  . Drug Use: No  . Sexual Activity: Not Asked   Other Topics Concern  . None   Social History Narrative    Outpatient Encounter Prescriptions as of 05/16/2015  Medication Sig  . acetaminophen (TYLENOL) 500 MG tablet Take 1,000 mg by mouth every 6 (six) hours as needed for moderate pain.  Marland Kitchen ALPRAZolam (XANAX) 0.25 MG tablet TAKE ONE TABLET BY MOUTH AT BEDTIME AS NEEDED FOR ANXIETY  . omeprazole (PRILOSEC) 40 MG capsule Take 40 mg by mouth daily.  . ondansetron (ZOFRAN) 4 MG tablet Take 1 tablet (4 mg total) by mouth 2 (two) times daily as needed for nausea or vomiting.  . ranitidine (ZANTAC) 150 MG tablet Take 150 mg by mouth at bedtime.   Facility-Administered Encounter Medications as of 05/16/2015  Medication  . lidocaine (PF) (XYLOCAINE) 1 % injection 10 mL  . methylPREDNISolone acetate (DEPO-MEDROL) injection 80 mg  . ropivacaine (PF) 2 mg/ml (0.2%) (NAROPIN) epidural 5 mL    Review of Systems  Constitutional: Negative for fever, activity change and unexpected weight  change.  HENT: Negative for congestion and sinus pressure.   Respiratory: Negative for cough and shortness of breath.   Gastrointestinal: Negative for nausea, vomiting and abdominal pain.  Musculoskeletal:       Arms and legs sore.  She feels related to increased work yesterday.   Skin: Negative for color change and rash.       Objective:    Physical Exam  Constitutional: She appears well-developed and well-nourished. No distress.  Neck: Neck supple.  Cardiovascular: Normal rate and regular rhythm.   Pulmonary/Chest: Breath sounds normal. No respiratory distress. She has no wheezes.  Breast exam reveals no nipple discharge or nipple retraction present.  Could not appreciate any nodules or axillary adenopathy to be present.  Superficial lesion right axilla - question of sebaceous cyst, etc.  No increased erythema.  No evidence of  infection.  No significant tenderness.    Abdominal: Soft. Bowel sounds are normal. There is no tenderness.  Lymphadenopathy:    She has no cervical adenopathy.    BP 110/70 mmHg  Pulse 74  Temp(Src) 98.5 F (36.9 C) (Oral)  Resp 18  Ht 5\' 2"  (1.575 m)  Wt 127 lb 8 oz (57.834 kg)  BMI 23.31 kg/m2  SpO2 97%  LMP 02/12/2013 Wt Readings from Last 3 Encounters:  05/16/15 127 lb 8 oz (57.834 kg)  02/19/15 128 lb (58.06 kg)  01/25/15 123 lb (55.792 kg)     Lab Results  Component Value Date   WBC 5.1 03/22/2015   HGB 13.8 03/22/2015   HCT 40.6 03/22/2015   PLT 248.0 03/22/2015   GLUCOSE 100* 03/22/2015   CHOL 179 08/30/2014   TRIG 129.0 08/30/2014   HDL 63.70 08/30/2014   LDLCALC 90 08/30/2014   ALT 15 03/22/2015   AST 17 03/22/2015   NA 140 03/22/2015   K 3.9 03/22/2015   CL 105 03/22/2015   CREATININE 0.74 03/22/2015   BUN 11 03/22/2015   CO2 27 03/22/2015   TSH 1.21 03/22/2015    Mm Digital Screening Bilateral  11/13/2014  CLINICAL DATA:  Screening. EXAM: DIGITAL SCREENING BILATERAL MAMMOGRAM WITH CAD COMPARISON:  Previous exam(s). ACR Breast Density Category d: The breast tissue is extremely dense, which lowers the sensitivity of mammography. FINDINGS: There are no findings suspicious for malignancy. Images were processed with CAD. IMPRESSION: No mammographic evidence of malignancy. A result letter of this screening mammogram will be mailed directly to the patient. RECOMMENDATION: Screening mammogram in one year. (Code:SM-B-01Y) BI-RADS CATEGORY  1: Negative. Electronically Signed   By: Lovey Newcomer M.D.   On: 11/13/2014 11:36       Assessment & Plan:   Problem List Items Addressed This Visit    Axillary lump - Primary    Unclear as to the exact etiology.  Very superficial.  Unable to appreciate any breast lump or adenopathy.  Warm compresses.  No evidence of infection.  She already has an appt scheduled with me next week.  Keep appt to reassess.             [name], MD

## 2015-05-20 ENCOUNTER — Telehealth: Payer: Self-pay | Admitting: Internal Medicine

## 2015-05-20 NOTE — Telephone Encounter (Signed)
Message sent for update  

## 2015-05-22 ENCOUNTER — Encounter: Payer: Self-pay | Admitting: Internal Medicine

## 2015-05-22 ENCOUNTER — Ambulatory Visit (INDEPENDENT_AMBULATORY_CARE_PROVIDER_SITE_OTHER): Payer: 59 | Admitting: Internal Medicine

## 2015-05-22 VITALS — BP 118/68 | HR 87 | Temp 98.4°F | Ht 62.0 in | Wt 127.4 lb

## 2015-05-22 DIAGNOSIS — M791 Myalgia, unspecified site: Secondary | ICD-10-CM

## 2015-05-22 DIAGNOSIS — M79605 Pain in left leg: Secondary | ICD-10-CM | POA: Diagnosis not present

## 2015-05-22 DIAGNOSIS — M79604 Pain in right leg: Secondary | ICD-10-CM | POA: Diagnosis not present

## 2015-05-22 DIAGNOSIS — F419 Anxiety disorder, unspecified: Secondary | ICD-10-CM

## 2015-05-22 DIAGNOSIS — R2230 Localized swelling, mass and lump, unspecified upper limb: Secondary | ICD-10-CM | POA: Insufficient documentation

## 2015-05-22 DIAGNOSIS — N950 Postmenopausal bleeding: Secondary | ICD-10-CM

## 2015-05-22 DIAGNOSIS — R2231 Localized swelling, mass and lump, right upper limb: Secondary | ICD-10-CM

## 2015-05-22 DIAGNOSIS — K219 Gastro-esophageal reflux disease without esophagitis: Secondary | ICD-10-CM

## 2015-05-22 LAB — SEDIMENTATION RATE: SED RATE: 8 mm/h (ref 0–22)

## 2015-05-22 LAB — CK: Total CK: 65 U/L (ref 7–177)

## 2015-05-22 MED ORDER — ALPRAZOLAM 0.25 MG PO TABS: ORAL_TABLET | ORAL | Status: DC

## 2015-05-22 NOTE — Progress Notes (Signed)
Pre visit review using our clinic review tool, if applicable. No additional management support is needed unless otherwise documented below in the visit note. 

## 2015-05-22 NOTE — Assessment & Plan Note (Signed)
Unclear as to the exact etiology.  Very superficial.  Unable to appreciate any breast lump or adenopathy.  Warm compresses.  No evidence of infection.  She already has an appt scheduled with me next week.  Keep appt to reassess.

## 2015-05-22 NOTE — Progress Notes (Signed)
Patient ID: Shannon [name], female   DOB: 1965/12/15, 50 y.o.   MRN: [id number]   Subjective:    Patient ID: Shannon [name], female    DOB: 05-04-1965, 50 y.o.   MRN: [id number]  HPI  Patient here for a scheduled follow up.  She presents for scheduled follow up.  Was recently seen with concerns regarding right axillary lesion.  See last note.  She presents today stating the lesion is unchanged.  No pain.  No increased erythema.  She is eating and drinking well.  No acid reflux.  No abdominal pain or cramping.  Bowels stable.  Recently w/up by gyn for post menopausal bleeding.  Had saline infusion sonohysterography.  No endometrial pathology seen.  Was given two weeks of progesterone.  No further bleeding.  Stays active.  Still with some muscle soreness.  See last note.     Past Medical History  Diagnosis Date  . Hyperthyroidism     s/p ablation  . GERD (gastroesophageal reflux disease)   . History of migraine headaches   . Anemia   . Environmental allergies   . Hiatal hernia     small  . Breast pain, right 11/07/2012  . Vaginitis 01/16/2013  . Breathing difficulty 06/20/2014   Past Surgical History  Procedure Laterality Date  . Septoplasty  5/90    Dr Rossie Muskrat  . Nasal sinus surgery  11/93  . Cesarean section  11/98    Dr Roena Malady  . Tubal ligation  02/1998  . Umbilical hernia repair  02/1998  . Cholecystectomy  11/05    Dr Pat Patrick   Family History  Problem Relation Age of Onset  . Heart disease Father   . Hypertension Father   . Thyroid disease Father   . Hypercholesterolemia Father   . Kidney cancer Father   . Leukemia Father     hairy cell  . Hypercholesterolemia Mother   . Thyroid disease Mother   . Breast cancer Mother   . Breast cancer      maternal great grandmother  . Heart disease Maternal Grandfather     MI - age 10  . Prostate cancer Paternal Grandfather   . Pancreatic cancer Paternal Grandfather   . Lung cancer Paternal Grandfather    Social History    Social History  . Marital Status: Married    Spouse Name: N/A  . Number of Children: 1  . Years of Education: N/A   Social History Main Topics  . Smoking status: Former Research scientist (life sciences)  . Smokeless tobacco: Never Used  . Alcohol Use: No  . Drug Use: No  . Sexual Activity: Not Asked   Other Topics Concern  . None   Social History Narrative    Outpatient Encounter Prescriptions as of 05/22/2015  Medication Sig  . acetaminophen (TYLENOL) 500 MG tablet Take 1,000 mg by mouth every 6 (six) hours as needed for moderate pain.  Marland Kitchen ALPRAZolam (XANAX) 0.25 MG tablet TAKE ONE TABLET BY MOUTH AT BEDTIME AS NEEDED FOR ANXIETY  . omeprazole (PRILOSEC) 40 MG capsule Take 40 mg by mouth daily.  . ondansetron (ZOFRAN) 4 MG tablet Take 1 tablet (4 mg total) by mouth 2 (two) times daily as needed for nausea or vomiting.  . ranitidine (ZANTAC) 150 MG tablet Take 150 mg by mouth at bedtime.  . [DISCONTINUED] ALPRAZolam (XANAX) 0.25 MG tablet TAKE ONE TABLET BY MOUTH AT BEDTIME AS NEEDED FOR ANXIETY  . [DISCONTINUED] lidocaine (PF) (XYLOCAINE) 1 % injection 10 mL   . [  DISCONTINUED] methylPREDNISolone acetate (DEPO-MEDROL) injection 80 mg   . [DISCONTINUED] ropivacaine (PF) 2 mg/ml (0.2%) (NAROPIN) epidural 5 mL    No facility-administered encounter medications on file as of 05/22/2015.    Review of Systems  Constitutional: Negative for appetite change and unexpected weight change.  HENT: Negative for congestion and sinus pressure.   Respiratory: Negative for cough, chest tightness and shortness of breath.   Cardiovascular: Negative for chest pain, palpitations and leg swelling.  Gastrointestinal: Negative for nausea, vomiting, abdominal pain and diarrhea.  Genitourinary: Negative for dysuria and difficulty urinating.  Musculoskeletal: Negative for joint swelling.       Muscle soreness as outlined.    Skin: Negative for color change and rash.  Neurological: Negative for dizziness, light-headedness and  headaches.  Psychiatric/Behavioral: Negative for dysphoric mood and agitation.       Objective:    Physical Exam  Constitutional: She appears well-developed and well-nourished. No distress.  HENT:  Nose: Nose normal.  Mouth/Throat: Oropharynx is clear and moist.  Neck: Neck supple.  Cardiovascular: Normal rate and regular rhythm.   Pulmonary/Chest: Breath sounds normal. No respiratory distress. She has no wheezes.  Abdominal: Soft. Bowel sounds are normal. There is no tenderness.  Musculoskeletal: She exhibits no edema or tenderness.  Lymphadenopathy:    She has no cervical adenopathy.  Skin: No rash noted. No erythema.  Persistent palpable right axillary lesion.  Non tender.   Psychiatric: She has a normal mood and affect. Her behavior is normal.    BP 118/68 mmHg  Pulse 87  Temp(Src) 98.4 F (36.9 C) (Oral)  Ht 5' 2" (1.575 m)  Wt 127 lb 6.4 oz (57.788 kg)  BMI 23.30 kg/m2  SpO2 96%  LMP 02/12/2013 Wt Readings from Last 3 Encounters:  05/22/15 127 lb 6.4 oz (57.788 kg)  05/16/15 127 lb 8 oz (57.834 kg)  02/19/15 128 lb (58.06 kg)     Lab Results  Component Value Date   WBC 5.1 03/22/2015   HGB 13.8 03/22/2015   HCT 40.6 03/22/2015   PLT 248.0 03/22/2015   GLUCOSE 100* 03/22/2015   CHOL 179 08/30/2014   TRIG 129.0 08/30/2014   HDL 63.70 08/30/2014   LDLCALC 90 08/30/2014   ALT 15 03/22/2015   AST 17 03/22/2015   NA 140 03/22/2015   K 3.9 03/22/2015   CL 105 03/22/2015   CREATININE 0.74 03/22/2015   BUN 11 03/22/2015   CO2 27 03/22/2015   TSH 1.21 03/22/2015    Mm Digital Screening Bilateral  11/13/2014  CLINICAL DATA:  Screening. EXAM: DIGITAL SCREENING BILATERAL MAMMOGRAM WITH CAD COMPARISON:  Previous exam(s). ACR Breast Density Category d: The breast tissue is extremely dense, which lowers the sensitivity of mammography. FINDINGS: There are no findings suspicious for malignancy. Images were processed with CAD. IMPRESSION: No mammographic evidence of  malignancy. A result letter of this screening mammogram will be mailed directly to the patient. RECOMMENDATION: Screening mammogram in one year. (Code:SM-B-01Y) BI-RADS CATEGORY  1: Negative. Electronically Signed   By: Lovey Newcomer M.D.   On: 11/13/2014 11:36       Assessment & Plan:   Problem List Items Addressed This Visit    Anxiety    Takes xanax.  Follow.  Feels stable.       Relevant Medications   ALPRAZolam (XANAX) 0.25 MG tablet   Axillary lump - Primary    Persistent.  Superficial.  No lymphadenopathy found.  She is concerned given her family history.  Refer to  surgery to see if any further evaluation or testing warranted.  Just had mammogram 10/2014 - Birads I.  She request to see Dr Tamala Julian.       Relevant Orders   Ambulatory referral to General Surgery   GERD (gastroesophageal reflux disease)    Upper symptoms controlled.  On omeprazole.       Muscle ache    Persistent.  Check esr and ck.  Stay hydrated.       Post-menopausal bleeding    Worked up by gyn as outlined.  See note.  No endometrial pathology found.  No further bleeding.  Follow.        Other Visit Diagnoses    Leg pain, bilateral        Relevant Orders    Sedimentation rate (Completed)    CK (Creatine Kinase) (Completed)        [name], MD

## 2015-05-24 NOTE — Telephone Encounter (Signed)
Unread mychart message mailed to patient 

## 2015-05-28 ENCOUNTER — Encounter: Payer: Self-pay | Admitting: Internal Medicine

## 2015-05-28 DIAGNOSIS — M791 Myalgia, unspecified site: Secondary | ICD-10-CM | POA: Insufficient documentation

## 2015-05-28 NOTE — Assessment & Plan Note (Signed)
Persistent.  Superficial.  No lymphadenopathy found.  She is concerned given her family history.  Refer to surgery to see if any further evaluation or testing warranted.  Just had mammogram 10/2014 - Birads I.  She request to see Dr Tamala Julian.

## 2015-05-28 NOTE — Assessment & Plan Note (Signed)
Worked up by gyn as outlined.  See note.  No endometrial pathology found.  No further bleeding.  Follow.

## 2015-05-28 NOTE — Assessment & Plan Note (Signed)
Upper symptoms controlled.  On omeprazole.   

## 2015-05-28 NOTE — Assessment & Plan Note (Signed)
Persistent.  Check esr and ck.  Stay hydrated.

## 2015-05-28 NOTE — Assessment & Plan Note (Signed)
Takes xanax.  Follow.  Feels stable.

## 2015-06-10 ENCOUNTER — Encounter: Payer: Self-pay | Admitting: Internal Medicine

## 2015-06-14 ENCOUNTER — Encounter: Payer: Self-pay | Admitting: Pain Medicine

## 2015-06-14 ENCOUNTER — Ambulatory Visit: Payer: 59 | Attending: Pain Medicine | Admitting: Pain Medicine

## 2015-06-14 VITALS — BP 157/75 | HR 61 | Temp 98.4°F | Resp 14 | Ht 62.0 in | Wt 123.0 lb

## 2015-06-14 DIAGNOSIS — G8929 Other chronic pain: Secondary | ICD-10-CM | POA: Insufficient documentation

## 2015-06-14 DIAGNOSIS — N950 Postmenopausal bleeding: Secondary | ICD-10-CM | POA: Insufficient documentation

## 2015-06-14 DIAGNOSIS — E0590 Thyrotoxicosis, unspecified without thyrotoxic crisis or storm: Secondary | ICD-10-CM | POA: Insufficient documentation

## 2015-06-14 DIAGNOSIS — F17210 Nicotine dependence, cigarettes, uncomplicated: Secondary | ICD-10-CM | POA: Insufficient documentation

## 2015-06-14 DIAGNOSIS — F419 Anxiety disorder, unspecified: Secondary | ICD-10-CM | POA: Diagnosis not present

## 2015-06-14 DIAGNOSIS — M533 Sacrococcygeal disorders, not elsewhere classified: Secondary | ICD-10-CM | POA: Diagnosis not present

## 2015-06-14 DIAGNOSIS — R1013 Epigastric pain: Secondary | ICD-10-CM | POA: Diagnosis not present

## 2015-06-14 DIAGNOSIS — M25552 Pain in left hip: Secondary | ICD-10-CM | POA: Diagnosis not present

## 2015-06-14 DIAGNOSIS — R0789 Other chest pain: Secondary | ICD-10-CM | POA: Diagnosis not present

## 2015-06-14 DIAGNOSIS — G43909 Migraine, unspecified, not intractable, without status migrainosus: Secondary | ICD-10-CM | POA: Diagnosis not present

## 2015-06-14 DIAGNOSIS — K219 Gastro-esophageal reflux disease without esophagitis: Secondary | ICD-10-CM | POA: Insufficient documentation

## 2015-06-14 DIAGNOSIS — M545 Low back pain: Secondary | ICD-10-CM | POA: Diagnosis not present

## 2015-06-14 DIAGNOSIS — M4806 Spinal stenosis, lumbar region: Secondary | ICD-10-CM | POA: Diagnosis not present

## 2015-06-14 DIAGNOSIS — M1612 Unilateral primary osteoarthritis, left hip: Secondary | ICD-10-CM | POA: Diagnosis not present

## 2015-06-14 DIAGNOSIS — D649 Anemia, unspecified: Secondary | ICD-10-CM | POA: Insufficient documentation

## 2015-06-14 DIAGNOSIS — M25559 Pain in unspecified hip: Secondary | ICD-10-CM | POA: Diagnosis present

## 2015-06-14 MED ORDER — ROPIVACAINE HCL 2 MG/ML IJ SOLN
INTRAMUSCULAR | Status: AC
  Administered 2015-06-14: 10:00:00
  Filled 2015-06-14: qty 10

## 2015-06-14 MED ORDER — METHYLPREDNISOLONE ACETATE 80 MG/ML IJ SUSP: 80.0000 mg | Freq: Once | INTRAMUSCULAR | Status: DC

## 2015-06-14 MED ORDER — METHYLPREDNISOLONE ACETATE 80 MG/ML IJ SUSP
INTRAMUSCULAR | Status: AC
  Administered 2015-06-14: 10:00:00
  Filled 2015-06-14: qty 1

## 2015-06-14 MED ORDER — ROPIVACAINE HCL 2 MG/ML IJ SOLN: 5.0000 mL | Freq: Once | INTRAMUSCULAR | Status: DC

## 2015-06-14 MED ORDER — IOPAMIDOL (ISOVUE-M 200) INJECTION 41%
10.0000 mL | Freq: Once | INTRAMUSCULAR | Status: AC
  Administered 2015-06-14: 10 mL via EPIDURAL
  Filled 2015-06-14: qty 10

## 2015-06-14 MED ORDER — LIDOCAINE HCL (PF) 1 % IJ SOLN: 10.0000 mL | Freq: Once | INTRAMUSCULAR | Status: DC

## 2015-06-14 NOTE — Patient Instructions (Signed)

## 2015-06-14 NOTE — Progress Notes (Signed)
Patient's Name: Shannon [name]  Patient type: Established  MRN: [id number]  Service setting: Ambulatory outpatient  DOB: 1965/12/30  Location: [facility]  DOS: 06/14/2015  Primary Care Physician: [name], MD  Note by: Kathlen Brunswick. Dossie Arbour, M.D, DABA, DABAPM, DABPM, Milagros Evener, FIPP  Referring Physician: [name], MD  Specialty: Board-Certified Interventional Pain Management  Last Visit to Pain Management: 01/25/2015   Primary Reason(s) for Visit: Interventional Pain Management Treatment. CC: Hip Pain  Primary Diagnosis: Primary osteoarthritis of left hip [M16.12]   Procedure:  Anesthesia, Analgesia, Anxiolysis:  Type: Palliative Intra-Articular Hip Injection Region:  Posterolateral hip joint area. Level: Lower pelvic and hip joint level. Laterality: Left  Indications: 1. Primary osteoarthritis of left hip   2. Chronic left hip pain (intermittent)     Pre-procedure Pain Score: 3/10 Reported level of pain is compatible with clinical observations Post-procedure Pain Score: 3   Type: Local Anesthesia Local Anesthetic: Lidocaine 1% Route: Infiltration ([place]/IM) IV Access: Declined Sedation: Declined  Indication(s): Analgesia     Pre-Procedure Assessment  Shannon [name] is a 50 y.o. year old, female patient, seen today for interventional treatment. She has Hyperthyroidism; GERD (gastroesophageal reflux disease); Anemia; Migraine; Menopausal symptoms; Health care maintenance; Anxiety; Atypical chest pain; Abdominal pain, epigastric; Gastro-esophageal reflux disease without esophagitis; Chronic pain; Chronic low back pain (Left); Lumbar lateral recess stenosis (L4-5) (Left); Lumbar facet hypertrophy (L4-5) (Left); Chronic lumbar radicular pain (Left) (L4 Dermatome); Chronic left hip pain (intermittent); Chronic sacroiliac joint pain (intermittent) (Left); Nicotine dependence; Tobacco abuse; Osteoarthritis of hip (Left); Lumbar spondylosis; Encounter for chronic  pain management; Lumbar radiculopathy, acute (Left); Post-menopausal bleeding; Axillary lump; Muscle ache; and Hemorrhage, postmenopausal on her problem list.. Her primarily concern today is the Hip Pain   Pain Type: Chronic pain Pain Location: Hip Pain Orientation: Left Pain Descriptors / Indicators: Aching, Sharp, Shooting, Stabbing, Dull Pain Frequency: Constant  Date of Last Visit: 01/25/15 Service Provided on Last Visit: Evaluation  Verification of the correct person, correct site (including marking of site), and correct procedure were performed and confirmed by the patient.  Consent: Secured. Under the influence of no sedatives a written informed consent was obtained, after having provided information on the risks and possible complications. To fulfill our ethical and legal obligations, as recommended by the American Medical Association's Code of Ethics, we have provided information to the patient about our clinical impression; the nature and purpose of the treatment or procedure; the risks, benefits, and possible complications of the intervention; alternatives; the risk(s) and benefit(s) of the alternative treatment(s) or procedure(s); and the risk(s) and benefit(s) of doing nothing. The patient was provided information about the risks and possible complications associated with the procedure. These include, but are not limited to, failure to achieve desired goals, infection, bleeding, organ or nerve damage, allergic reactions, paralysis, and death. In the case of intra- or periarticular procedures these may include, but are not limited to, failure to achieve desired goals, infection, bleeding (hemarthrosis), organ or nerve damage, allergic reactions, and death. In addition, the patient was informed that Medicine is not an exact science; therefore, there is also the possibility of unforeseen risks and possible complications that may result in a catastrophic outcome. The patient indicated having  understood very clearly. We have given the patient no guarantees and we have made no promises. Enough time was given to the patient to ask questions, all of which were answered to the patient's satisfaction.  Consent Attestation: I, the ordering provider, attest that I  have discussed with the patient the benefits, risks, side-effects, alternatives, likelihood of achieving goals, and potential problems during recovery for the procedure that I have provided informed consent.  Pre-Procedure Preparation: Safety Precautions: Allergies reviewed. Appropriate site, procedure, and patient were confirmed by following the Joint Commission's Universal Protocol (UP.01.01.01), in the form of a "Time Out". The patient was asked to confirm marked site and procedure, before commencing. The patient was asked about blood thinners, or active infections, both of which were denied. Patient was assessed for positional comfort and all pressure points were checked before starting procedure. Allergies: She is allergic to codeine; decongest-aid; flagyl; and sulfa antibiotics.. Infection Control Precautions: Sterile technique used. Standard Universal Precautions were taken as recommended by the Department of [facility] for Disease Control and Prevention (CDC). Standard pre-surgical skin prep was conducted. Respiratory hygiene and cough etiquette was practiced. Hand hygiene observed. Safe injection practices and needle disposal techniques followed. SDV (single dose vial) medications used. Medications properly checked for expiration dates and contaminants. Personal protective equipment (PPE) used: Sterile Radiation-resistant gloves. Monitoring:  As per clinic protocol. Filed Vitals:   06/14/15 0852 06/14/15 0935  BP: 123/65 129/115  Pulse: 64 68  Temp: 98.4 F (36.9 C)   Resp: 16 17  Height: '5\' 2"'  (1.575 m)   Weight: 123 lb (55.792 kg)   SpO2: 100% 100%  Calculated BMI: Body mass index is 22.49  kg/(m^2).  Description of Procedure Process:   Time-out: "Time-out" completed before starting procedure, as per protocol. Position: Prone Target Area: Superior aspect of the hip joint cavity, going thru the superior portion of the capsular ligament. Approach: Posterolateral approach. Area Prepped: Entire Posterolateral hip area. Prepping solution: ChloraPrep (2% chlorhexidine gluconate and 70% isopropyl alcohol) Safety Precautions: Aspiration looking for blood return was conducted prior to all injections. At no point did we inject any substances, as a needle was being advanced. No attempts were made at seeking any paresthesias. Safe injection practices and needle disposal techniques used. Medications properly checked for expiration dates. SDV (single dose vial) medications used.   Description of the Procedure: Protocol guidelines were followed. The patient was placed in position over the fluoroscopy table. The target area was identified and the area prepped in the usual manner. Skin & deeper tissues infiltrated with local anesthetic. Appropriate amount of time allowed to pass for local anesthetics to take effect. The procedure needles were then advanced to the target area. Proper needle placement secured. Negative aspiration confirmed. Solution injected in intermittent fashion, asking for systemic symptoms every 0.5cc of injectate. The needles were then removed and the area cleansed, making sure to leave some of the prepping solution back to take advantage of its long term bactericidal properties. EBL: Minimal Materials & Medications Used:  Needle(s) Used: 22g - 5" Spinal Needle(s) Solution Injected: 0.2% PF-Ropivacaine (63m) + SDV-DepoMedrol 80 mg/ml (176m Medications Administered today: We administered ropivacaine (PF) 2 mg/ml (0.2%) and methylPREDNISolone acetate.Please see chart orders for dosing details.  Imaging Guidance:   Type of Imaging Technique: Fluoroscopy Guidance  (Non-spinal) Indication(s): Assistance in needle guidance and placement for procedures requiring needle placement in or near specific anatomical locations not easily accessible without such assistance. Exposure Time: Please see nurses notes. Contrast: Before injecting any contrast, we confirmed that the patient did not have an allergy to iodine, shellfish, or radiological contrast. Once satisfactory needle placement was completed at the desired level, radiological contrast was injected. Injection was conducted under continuous fluoroscopic guidance. Injection of contrast accomplished without complications. See  chart for type and volume of contrast used. Fluoroscopic Guidance: I was personally present in the fluoroscopy suite, where the patient was placed in position for the procedure, over the fluoroscopy-compatible table. Fluoroscopy was manipulated, using "Tunnel Vision Technique", to obtain the best possible view of the target area, on the affected side. Parallax error was corrected before commencing the procedure. A "direction-depth-direction" technique was used to introduce the needle under continuous pulsed fluoroscopic guidance. Once the target was reached, antero-posterior, oblique, and lateral fluoroscopic projection views were taken to confirm needle placement in all planes. Permanently recorded images stored by scanning into EMR. Interpretation: Intraoperative imaging interpretation by performing Physician. Adequate needle placement confirmed in AP, Lateral, & Oblique Views. Appropriate spread of contrast to desired area. No evidence of afferent or efferent intravascular uptake. Permanent images scanned into the patient's record.. Severe degenerative joint disease of the left hip with clear evidence of bone on bone in the superior aspect of the joint.  Antibiotic Prophylaxis:  Indication(s): No indications identified. Type:  Antibiotics Given (last 72 hours)    None       Post-operative  Assessment:   Complications: No immediate post-treatment complications were observed. Disposition: The patient was discharged home, once institutional criteria were met. Return to clinic in 2 weeks for follow-up evaluation and interpretation of results. The patient tolerated the entire procedure well. A repeat set of vitals were taken after the procedure and the patient was kept under observation following institutional policy, for this type of procedure. Post-procedural neurological assessment was performed, showing return to baseline, prior to discharge. The patient was provided with post-procedure discharge instructions, including a section on how to identify potential problems. Should any problems arise concerning this procedure, the patient was given instructions to immediately contact us, at any time, without hesitation. In any case, we plan to contact the patient by telephone for a follow-up status report regarding this interventional procedure. Comments:  Based on today's findings, it is clear to me that this patient will need a total hip replacement on the left side. She is already seen an orthopedic surgeon for this.  Medications administered during this visit: We administered ropivacaine (PF) 2 mg/ml (0.2%) and methylPREDNISolone acetate.  Prescriptions ordered during this visit: New Prescriptions   No medications on file    Requested PM Follow-up: Return for Post-Procedure Eval (2 weeks).  Future Appointments Date Time Provider [place]  09/25/2015 10:00 AM [name], MD [facility] None    Primary Care Physician: [name], MD Location: [facility] Note by: Kathlen Brunswick. Dossie Arbour, M.D, DABA, DABAPM, DABPM, DABIPP, FIPP  Disclaimer:  Medicine is not an exact science. The only guarantee in medicine is that nothing is guaranteed. It is important to note that the decision to proceed with this intervention was based on the information  collected from the patient. The Data and conclusions were drawn from the patient's questionnaire, the interview, and the physical examination. Because the information was provided in large part by the patient, it cannot be guaranteed that it has not been purposely or unconsciously manipulated. Every effort has been made to obtain as much relevant data as possible for this evaluation. It is important to note that the conclusions that lead to this procedure are derived in large part from the available data. Always take into account that the treatment will also be dependent on availability of resources and existing treatment guidelines, considered by other Pain Management Practitioners as being common knowledge and practice, at the time  of the intervention. For Medico-Legal purposes, it is also important to point out that variation in procedural techniques and pharmacological choices are the acceptable norm. The indications, contraindications, technique, and results of the above procedure should only be interpreted and judged by a Board-Certified Interventional Pain Specialist with extensive familiarity and expertise in the same exact procedure and technique. Attempts at providing opinions without similar or greater experience and expertise than that of the treating physician will be considered as inappropriate and unethical, and shall result in a formal complaint to the state medical board and applicable specialty societies.

## 2015-06-14 NOTE — Progress Notes (Signed)
Safety precautions to be maintained throughout the outpatient stay will include: orient to surroundings, keep bed in low position, maintain call bell within reach at all times, provide assistance with transfer out of bed and ambulation.  

## 2015-06-15 ENCOUNTER — Telehealth: Payer: Self-pay | Admitting: *Deleted

## 2015-06-15 NOTE — Telephone Encounter (Signed)
No answer, left message

## 2015-07-03 ENCOUNTER — Encounter: Payer: Self-pay | Admitting: Pain Medicine

## 2015-07-03 ENCOUNTER — Ambulatory Visit: Payer: 59 | Attending: Pain Medicine | Admitting: Pain Medicine

## 2015-07-03 VITALS — BP 132/71 | HR 71 | Temp 98.4°F | Resp 16 | Ht 62.0 in | Wt 123.0 lb

## 2015-07-03 DIAGNOSIS — M25552 Pain in left hip: Secondary | ICD-10-CM | POA: Diagnosis not present

## 2015-07-03 DIAGNOSIS — M5416 Radiculopathy, lumbar region: Secondary | ICD-10-CM

## 2015-07-03 DIAGNOSIS — R0789 Other chest pain: Secondary | ICD-10-CM | POA: Diagnosis not present

## 2015-07-03 DIAGNOSIS — M4806 Spinal stenosis, lumbar region: Secondary | ICD-10-CM | POA: Diagnosis not present

## 2015-07-03 DIAGNOSIS — M545 Low back pain, unspecified: Secondary | ICD-10-CM

## 2015-07-03 DIAGNOSIS — F419 Anxiety disorder, unspecified: Secondary | ICD-10-CM | POA: Insufficient documentation

## 2015-07-03 DIAGNOSIS — G8929 Other chronic pain: Secondary | ICD-10-CM | POA: Diagnosis not present

## 2015-07-03 DIAGNOSIS — F17210 Nicotine dependence, cigarettes, uncomplicated: Secondary | ICD-10-CM | POA: Insufficient documentation

## 2015-07-03 DIAGNOSIS — M47896 Other spondylosis, lumbar region: Secondary | ICD-10-CM | POA: Insufficient documentation

## 2015-07-03 DIAGNOSIS — K219 Gastro-esophageal reflux disease without esophagitis: Secondary | ICD-10-CM | POA: Insufficient documentation

## 2015-07-03 DIAGNOSIS — D649 Anemia, unspecified: Secondary | ICD-10-CM | POA: Insufficient documentation

## 2015-07-03 DIAGNOSIS — R1013 Epigastric pain: Secondary | ICD-10-CM | POA: Diagnosis not present

## 2015-07-03 DIAGNOSIS — Z87891 Personal history of nicotine dependence: Secondary | ICD-10-CM | POA: Diagnosis not present

## 2015-07-03 DIAGNOSIS — M47816 Spondylosis without myelopathy or radiculopathy, lumbar region: Secondary | ICD-10-CM

## 2015-07-03 DIAGNOSIS — M1612 Unilateral primary osteoarthritis, left hip: Secondary | ICD-10-CM | POA: Insufficient documentation

## 2015-07-03 DIAGNOSIS — E0590 Thyrotoxicosis, unspecified without thyrotoxic crisis or storm: Secondary | ICD-10-CM | POA: Diagnosis not present

## 2015-07-03 DIAGNOSIS — G43909 Migraine, unspecified, not intractable, without status migrainosus: Secondary | ICD-10-CM | POA: Diagnosis not present

## 2015-07-03 DIAGNOSIS — M533 Sacrococcygeal disorders, not elsewhere classified: Secondary | ICD-10-CM | POA: Diagnosis not present

## 2015-07-03 DIAGNOSIS — M48061 Spinal stenosis, lumbar region without neurogenic claudication: Secondary | ICD-10-CM

## 2015-07-03 DIAGNOSIS — M25559 Pain in unspecified hip: Secondary | ICD-10-CM | POA: Diagnosis present

## 2015-07-03 NOTE — Progress Notes (Signed)
Patient's Name: Shannon [name]  Patient type: Established  MRN: [id number]  Service setting: Ambulatory outpatient  DOB: 1965/10/27  Location: [facility]  DOS: 07/03/2015  Primary Care Physician: [name], MD  Note by: Kathlen Brunswick. Dossie Arbour, M.D, DABA, [place], DABPM, Milagros Evener, FIPP  Referring Physician: [name], MD  Specialty: Board-Certified Interventional Pain Management  Last Visit to Pain Management: 06/15/2015   Primary Reason(s) for Visit: Encounter for post-procedure evaluation of chronic illness with mild to moderate exacerbation CC: Hip Pain   HPI  Shannon [name] is a 50 y.o. year old, female patient, who returns today as an established patient. She has Hyperthyroidism; GERD (gastroesophageal reflux disease); Anemia; Migraine; Menopausal symptoms; Health care maintenance; Anxiety; Atypical chest pain; Abdominal pain, epigastric; Gastro-esophageal reflux disease without esophagitis; Chronic pain; Chronic low back pain (Left); Lumbar lateral recess stenosis (L4-5) (Left); Lumbar facet hypertrophy (L4-5) (Left); Chronic lumbar radicular pain (Location of Tertiary source of pain) (intermittent) (L4 Dermatome) (Left); Chronic hip pain (Location of Primary Source of Pain) (intermittent) (Left); Chronic sacroiliac joint pain (Location of Secondary source of pain) (intermittent) (Left); Nicotine dependence; Tobacco abuse; Osteoarthritis of hip (bone-on-bone) (Left); Lumbar spondylosis; Encounter for chronic pain management; Post-menopausal bleeding; Axillary lump; Muscle ache; and Hemorrhage, postmenopausal on her problem list.. Her primarily concern today is the Hip Pain   Pain Assessment: Self-Reported Pain Score: 2  Reported level is compatible with observation Pain Type: Chronic pain Pain Location: Hip Pain Orientation: Left Pain Descriptors / Indicators: Aching, Sharp, Shooting, Dull, Stabbing Pain Frequency: Intermittent  The patient comes into the  clinics today for post-procedure evaluation on the interventional treatment done on 06/14/2015.  Date of Last Visit: 06/14/15    Post-Procedure Assessment  Procedure done on last visit: Palliative left intra-articular hip joint injection. Side-effects or Adverse reactions: None reported Sedation: No sedation used  Results: Ultra-Short Term Relief (First 1 hour after procedure): 100 %  Analgesia during this period is likely to be Local Anesthetic and/or IV Sedative (Analgesic/Anxiolitic) related Short Term Relief (Initial 4-6 hrs after procedure): 100 % Complete relief confirms area to be the source of pain Long Term Relief : 80 % (may have been more relief but she is now experiencing a different pain in what she thinks is her SI joint) Long-term benefit would suggest an inflammatory etiology to the pain   Current Relief (Now): 80-90% relief of the hip pain, but she has noticed that she still has the pain in the area of the SI joint.  Persistent relief would suggest effective anti-inflammatory effects from steroids Interpretation of Results: Although this result would suggest the intra-articular hip joint injections to be a good palliative alternative, the last time that we did to injection it was clearly no around the joint suggesting severe degeneration and bone-on-bone problems. The patient has scheduled an appointment with the orthopedic surgeons for possible left total hip replacement.  Laboratory Chemistry  Inflammation Markers Lab Results  Component Value Date   ESRSEDRATE 8 05/22/2015    Renal Function Lab Results  Component Value Date   BUN 11 03/22/2015   CREATININE 0.74 03/22/2015   GFRAA >60 05/02/2014   GFRNONAA >60 05/02/2014    Hepatic Function Lab Results  Component Value Date   AST 17 03/22/2015   ALT 15 03/22/2015   ALBUMIN 4.4 03/22/2015    Electrolytes Lab Results  Component Value Date   NA 140 03/22/2015   K 3.9 03/22/2015   CL 105 03/22/2015    CALCIUM 9.9  03/22/2015    Pain Modulating Vitamins No results found for: VD25OH, [id number], H157544, [id number], VITAMINB12  Coagulation Parameters Lab Results  Component Value Date   PLT 248.0 03/22/2015    Note: Labs Reviewed.  Recent Diagnostic Imaging  Mm Digital Screening Bilateral  11/13/2014  CLINICAL DATA:  Screening. EXAM: DIGITAL SCREENING BILATERAL MAMMOGRAM WITH CAD COMPARISON:  Previous exam(s). ACR Breast Density Category d: The breast tissue is extremely dense, which lowers the sensitivity of mammography. FINDINGS: There are no findings suspicious for malignancy. Images were processed with CAD. IMPRESSION: No mammographic evidence of malignancy. A result letter of this screening mammogram will be mailed directly to the patient. RECOMMENDATION: Screening mammogram in one year. (Code:SM-B-01Y) BI-RADS CATEGORY  1: Negative. Electronically Signed   By: Lovey Newcomer M.D.   On: 11/13/2014 11:36   Lumbar Imaging: Lumbar MR wo contrast:  Results for orders placed in visit on 04/18/10  MR L Spine Ltd W/O Cm   Narrative * PRIOR REPORT IMPORTED FROM AN EXTERNAL SYSTEM *   PRIOR REPORT IMPORTED FROM THE [facility] WORKFLOW SYSTEM   REASON FOR EXAM:    low back pain lumbar radiculitis  COMMENTS:   PROCEDURE:     MR  - MR LUMBAR SPINE WO CONTRAST  - Apr 18 2010 10:30AM   RESULT:   HISTORY:     Low back pain with radiation into the left lower extremity.   COMPARISON STUDY:    No recent prior.   PROCEDURE AND FINDINGS:  Multiplanar, multisequence imaging of the lumbar  spine was obtained. No evidence of disc protrusion or spinal stenosis.  Mild  flattening of the left lateral recess at L4-L5 noted secondary to facetal  hypertrophy. No bony abnormality is noted. Lumbar cord is normal. No  paraspinal abnormalities identified. Lumbar vertebrae are numbered with  the  lowest lumbar shaped vertebra as L5.   IMPRESSION:   No significant abnormality identified.  Specifically,  there is no evidence  of disc protrusion, spinal stenosis or neural foraminal narrowing. No  acute  bony abnormality. The lumbar cord is  normal.   Thank you for the opportunity to contribute to the care of your patient.       Hip Imaging: Hip-L MR wo contrast:  Results for orders placed in visit on 01/13/09  MR Hip Left Wo Contrast   Narrative * PRIOR REPORT IMPORTED FROM AN EXTERNAL SYSTEM *   PRIOR REPORT IMPORTED FROM THE [facility] WORKFLOW SYSTEM   REASON FOR EXAM:    left hip and buttock pain  COMMENTS:  [phone] or [phone]   PROCEDURE:     MR  - MR HIP LT  WO CONTRAST  - Jan 13 2009  1:59PM   RESULT:     Multiplanar multisequence MR imaging of the pelvis is  performed  in the standard fashion. There is no previous examination for comparison.   There is subtle increased T2 signal in the superior portion of the left  acetabulum with decreased T1 signal in this region. Contusion could give a  similar appearance. A tiny occult fracture is not excluded. There does not  appear to be a significant amount of joint effusion or hemarthrosis. The  femoral heads show normal signal and contour. The uterus and ovaries  appear  to be unremarkable. The urinary bladder is within normal limits. The  sacrum  shows normal marrow signal. The sacral canal is unremarkable.   IMPRESSION:   There is subtle increased signal along the superior margin of  the left  acetabulum. The possibility of degenerative changes not excluded but  correlate for trauma or occult fracture. Inflammation is felt to be  unlikely.   Thank you for the opportunity to contribute to the care of your patient.        Meds  The patient has a current medication list which includes the following prescription(s): acetaminophen, alprazolam, omeprazole, and ranitidine.  Current Outpatient Prescriptions on File Prior to Visit  Medication Sig  . acetaminophen (TYLENOL) 500 MG tablet Take 1,000 mg by mouth every 6  (six) hours as needed for moderate pain.  Marland Kitchen ALPRAZolam (XANAX) 0.25 MG tablet TAKE ONE TABLET BY MOUTH AT BEDTIME AS NEEDED FOR ANXIETY  . omeprazole (PRILOSEC) 40 MG capsule Take 40 mg by mouth daily.  . ranitidine (ZANTAC) 150 MG tablet Take 150 mg by mouth at bedtime.   No current facility-administered medications on file prior to visit.    ROS  Constitutional: Denies any fever or chills Gastrointestinal: No reported hemesis, hematochezia, vomiting, or acute GI distress Musculoskeletal: Denies any acute onset joint swelling, redness, loss of ROM, or weakness Neurological: No reported episodes of acute onset apraxia, aphasia, dysarthria, agnosia, amnesia, paralysis, loss of coordination, or loss of consciousness  Allergies  Ms. Aminov is allergic to codeine; decongest-aid; flagyl; and sulfa antibiotics.  [place]  Medical:  Ms. Fiebig  has a past medical history of Hyperthyroidism; GERD (gastroesophageal reflux disease); History of migraine headaches; Anemia; Environmental allergies; Hiatal hernia; Breast pain, right (11/07/2012); Vaginitis (01/16/2013); and Breathing difficulty (06/20/2014). Family: family history includes Breast cancer in her mother; Heart disease in her father and maternal grandfather; Hypercholesterolemia in her father and mother; Hypertension in her father; Kidney cancer in her father; Leukemia in her father; Lung cancer in her paternal grandfather; Pancreatic cancer in her paternal grandfather; Prostate cancer in her paternal grandfather; Thyroid disease in her father and mother. Surgical:  has past surgical history that includes Septoplasty (5/90); Nasal sinus surgery (11/93); Cesarean section (11/98); Tubal ligation (02/1998); Umbilical hernia repair (02/1998); and Cholecystectomy (11/05). Tobacco:  reports that she has quit smoking. She has never used smokeless tobacco. Alcohol:  reports that she does not drink alcohol. Drug:  reports that she does not use illicit  drugs.  Constitutional Exam  Vitals: Blood pressure 132/71, pulse 71, temperature 98.4 F (36.9 C), temperature source Oral, resp. rate 16, height 5\' 2"  (1.575 m), weight 123 lb (55.792 kg), last menstrual period 02/12/2013, SpO2 99 %. General appearance: Well nourished, well developed, and well hydrated. In no acute distress Calculated BMI/Body habitus: Body mass index is 22.49 kg/(m^2). (18.5-24.9 kg/m2) Ideal body weight Psych/Mental status: Alert and oriented x 3 (person, place, & time) Eyes: PERLA Respiratory: No evidence of acute respiratory distress  Cervical Spine Exam  Inspection: No masses, redness, or swelling Alignment: Symmetrical ROM: Functional: ROM is within functional limits [facility]) Stability: No instability detected Muscle strength & Tone: Functionally intact Sensory: Unimpaired Palpation: No complaints of tenderness  Upper Extremity (UE) Exam    Side: Right upper extremity  Side: Left upper extremity  Inspection: No masses, redness, swelling, or asymmetry  Inspection: No masses, redness, swelling, or asymmetry  ROM:  ROM:  Functional: ROM is within functional limits [facility])  Functional: ROM is within functional limits [facility])  Muscle strength & Tone: Functionally intact  Muscle strength & Tone: Functionally intact  Sensory: Unimpaired  Sensory: Unimpaired  Palpation: Non-contributory  Palpation: Non-contributory   Thoracic Spine Exam  Inspection: No masses, redness, or swelling Alignment:  Symmetrical ROM: Functional: ROM is within functional limits [facility]) Stability: No instability detected Sensory: Unimpaired Muscle strength & Tone: Functionally intact Palpation: No complaints of tenderness  Lumbar Spine Exam  Inspection: No masses, redness, or swelling Alignment: Symmetrical ROM: Functional: Reduced REM Stability: No instability detected Muscle strength & Tone: Functionally intact Sensory: Unimpaired Palpation: Tender Provocative Tests: Lumbar  Hyperextension and rotation test: Positive for left sided lumbar pain Patrick's Maneuver: Positive for left sacroiliac joint pain.  Gait & Posture Assessment  Ambulation: Unassisted Gait: Antalgic Posture: WNL  Lower Extremity Exam    Side: Right lower extremity  Side: Left lower extremity  Inspection: No masses, redness, swelling, or asymmetry ROM:  Inspection: No masses, redness, swelling, or asymmetry ROM:  Functional: ROM is within functional limits [facility])  Functional: Decreased ROM for the hip joint.   Muscle strength & Tone: Functionally intact  Muscle strength & Tone: Functionally intact  Sensory: Unimpaired  Sensory: Unimpaired  Palpation: Non-contributory  Palpation: Non-contributory   Assessment & Plan  Primary Diagnosis & Pertinent Problem List: The primary encounter diagnosis was Chronic sacroiliac joint pain (Location of Secondary source of pain) (intermittent) (Left). Diagnoses of Chronic low back pain (Left), Chronic hip pain (Location of Primary Source of Pain) (intermittent) (Left), Chronic lumbar radicular pain (Location of Tertiary source of pain) (intermittent) (L4 Dermatome) (Left), Lumbar lateral recess stenosis (L4-5) (Left), and Lumbar facet hypertrophy (L4-5) (Left) were also pertinent to this visit.  Visit Diagnosis: 1. Chronic sacroiliac joint pain (Location of Secondary source of pain) (intermittent) (Left)   2. Chronic low back pain (Left)   3. Chronic hip pain (Location of Primary Source of Pain) (intermittent) (Left)   4. Chronic lumbar radicular pain (Location of Tertiary source of pain) (intermittent) (L4 Dermatome) (Left)   5. Lumbar lateral recess stenosis (L4-5) (Left)   6. Lumbar facet hypertrophy (L4-5) (Left)     Problem-specific Plan(s): No problem-specific assessment & plan notes found for this encounter.   Plan of Care   Problem List Items Addressed This Visit      High   Chronic hip pain (Location of Primary Source of Pain)  (intermittent) (Left) (Chronic)   Relevant Orders   HIP INJECTION   Chronic low back pain (Left) (Chronic)   Relevant Orders   LUMBAR FACET(MEDIAL BRANCH NERVE BLOCK) MBNB   Chronic lumbar radicular pain (Location of Tertiary source of pain) (intermittent) (L4 Dermatome) (Left) (Chronic)   Relevant Orders   LUMBAR EPIDURAL STEROID INJECTION   Chronic sacroiliac joint pain (Location of Secondary source of pain) (intermittent) (Left) - Primary (Chronic)   Relevant Orders   SACROILIAC JOINT INJECTINS   SACROILIAC JOINT INJECTINS   Lumbar facet hypertrophy (L4-5) (Left) (Chronic)   Relevant Orders   LUMBAR FACET(MEDIAL BRANCH NERVE BLOCK) MBNB   Lumbar lateral recess stenosis (L4-5) (Left) (Chronic)   Relevant Orders   LUMBAR EPIDURAL STEROID INJECTION       Pharmacotherapy (Medications Ordered): No orders of the defined types were placed in this encounter.    Lab-work & Procedure Ordered: Orders Placed This Encounter  Procedures  . SACROILIAC JOINT INJECTINS    Standing Status: Future     Number of Occurrences:      Standing Expiration Date: 07/02/2016    Scheduling Instructions:     Side: Left-sided     Sedation: No Sedation.     Timeframe: ASAA    Order Specific Question:  Where will this procedure be performed?    Answer:  [facility]  .  LUMBAR EPIDURAL STEROID INJECTION    For leg pain with or without low back pain.    Standing Status: Standing     Number of Occurrences: 1     Standing Expiration Date: 07/02/2016    Scheduling Instructions:     Side: Left-sided (L4-5)     Sedation: No Sedation.     Timeframe: PRN Procedure. Patient will call to schedule.    Order Specific Question:  Where will this procedure be performed?    Answer:  [facility]  . SACROILIAC JOINT INJECTINS    For low back pain and groin pain.    Standing Status: Standing     Number of Occurrences: 1     Standing Expiration Date: 07/02/2016    Scheduling Instructions:     Side:  Left-sided     Sedation: No Sedation.     Timeframe: PRN Procedure. Patient will call.    Order Specific Question:  Where will this procedure be performed?    Answer:  [facility]  . LUMBAR FACET(MEDIAL BRANCH NERVE BLOCK) MBNB    For low back pain.    Standing Status: Standing     Number of Occurrences: 1     Standing Expiration Date: 07/02/2016    Scheduling Instructions:     Side: Left-sided     Level: L2, L3, L4, L5, & S1 Medial Branch Nerve     Sedation: With Sedation.     Timeframe: PRN Procedure. Patient will call to schedule.    Order Specific Question:  Where will this procedure be performed?    Answer:  [facility]  . HIP INJECTION    For hip pain.    Standing Status: Standing     Number of Occurrences: 1     Standing Expiration Date: 07/02/2016    Scheduling Instructions:     Side: Left-sided     Sedation: No Sedation.     Timeframe: PRN Procedure. Patient will call.    Imaging Ordered: None  Interventional Therapies: Scheduled:  Palliative, left sided sacroiliac joint block under fluoroscopic guidance, without IV sedation.    Considering:  Left sacroiliac joint radiofrequency ablation.    PRN Procedures:   1. Palliative left intra-articular hip joint injection under fluoroscopic guidance, no sedation.  2. Palliative left sacroiliac joint block under fluoroscopic guidance, no sedation.  3. Diagnostic left-sided L4-5 lumbar epidural steroid injection under fluoroscopic guidance, no sedation, for the left lower extremity pain.  4. Diagnostic left sided lumbar facet block under fluoroscopic guidance and IV sedation for the low back pain.    Referral(s) or Consult(s): None at this time.  Medications administered during this visit: Ms. Uruchima had no medications administered during this visit.  Requested PM Follow-up: Return for Procedure (Scheduled).  Future Appointments Date Time Provider [place]  09/25/2015 10:00 AM [name], MD  [facility] None    Primary Care Physician: [name], MD Location: [facility] Note by: Kathlen Brunswick. Dossie Arbour, M.D, DABA, DABAPM, DABPM, DABIPP, FIPP  Pain Score Disclaimer: We use the NRS-11 scale. This is a self-reported, subjective measurement of pain severity with only modest accuracy. It is used primarily to identify changes within a particular patient. It must be understood that outpatient pain scales are significantly less accurate that those used for research, where they can be applied under ideal controlled circumstances with minimal exposure to variables. In reality, the score is likely to be a combination of pain intensity and pain affect, where  pain affect describes the degree of emotional arousal or changes in action readiness caused by the sensory experience of pain. Factors such as social and work situation, setting, emotional state, anxiety levels, expectation, and prior pain experience may influence pain perception and show large inter-individual differences that may also be affected by time variables.  Patient instructions provided during this appointment: There are no Patient Instructions on file for this visit.

## 2015-07-03 NOTE — Progress Notes (Signed)
Patient here for post procedure f/up.   Safety precautions to be maintained throughout the outpatient stay will include: orient to surroundings, keep bed in low position, maintain call bell within reach at all times, provide assistance with transfer out of bed and ambulation.

## 2015-07-12 ENCOUNTER — Encounter: Payer: Self-pay | Admitting: Pain Medicine

## 2015-07-12 ENCOUNTER — Ambulatory Visit: Payer: 59 | Attending: Pain Medicine | Admitting: Pain Medicine

## 2015-07-12 VITALS — BP 135/83 | HR 72 | Temp 98.1°F | Resp 17 | Ht 62.0 in | Wt 123.0 lb

## 2015-07-12 DIAGNOSIS — M47817 Spondylosis without myelopathy or radiculopathy, lumbosacral region: Secondary | ICD-10-CM | POA: Diagnosis not present

## 2015-07-12 DIAGNOSIS — M47896 Other spondylosis, lumbar region: Secondary | ICD-10-CM | POA: Diagnosis not present

## 2015-07-12 DIAGNOSIS — M4806 Spinal stenosis, lumbar region: Secondary | ICD-10-CM | POA: Diagnosis not present

## 2015-07-12 DIAGNOSIS — R0789 Other chest pain: Secondary | ICD-10-CM | POA: Insufficient documentation

## 2015-07-12 DIAGNOSIS — E0590 Thyrotoxicosis, unspecified without thyrotoxic crisis or storm: Secondary | ICD-10-CM | POA: Diagnosis not present

## 2015-07-12 DIAGNOSIS — M47898 Other spondylosis, sacral and sacrococcygeal region: Secondary | ICD-10-CM | POA: Diagnosis not present

## 2015-07-12 DIAGNOSIS — G8929 Other chronic pain: Secondary | ICD-10-CM | POA: Insufficient documentation

## 2015-07-12 DIAGNOSIS — N950 Postmenopausal bleeding: Secondary | ICD-10-CM | POA: Diagnosis not present

## 2015-07-12 DIAGNOSIS — R1013 Epigastric pain: Secondary | ICD-10-CM | POA: Diagnosis not present

## 2015-07-12 DIAGNOSIS — D649 Anemia, unspecified: Secondary | ICD-10-CM | POA: Insufficient documentation

## 2015-07-12 DIAGNOSIS — F419 Anxiety disorder, unspecified: Secondary | ICD-10-CM | POA: Insufficient documentation

## 2015-07-12 DIAGNOSIS — M549 Dorsalgia, unspecified: Secondary | ICD-10-CM | POA: Diagnosis present

## 2015-07-12 DIAGNOSIS — M461 Sacroiliitis, not elsewhere classified: Secondary | ICD-10-CM | POA: Insufficient documentation

## 2015-07-12 DIAGNOSIS — K219 Gastro-esophageal reflux disease without esophagitis: Secondary | ICD-10-CM | POA: Insufficient documentation

## 2015-07-12 DIAGNOSIS — M545 Low back pain, unspecified: Secondary | ICD-10-CM

## 2015-07-12 DIAGNOSIS — F17210 Nicotine dependence, cigarettes, uncomplicated: Secondary | ICD-10-CM | POA: Insufficient documentation

## 2015-07-12 DIAGNOSIS — M25552 Pain in left hip: Secondary | ICD-10-CM | POA: Insufficient documentation

## 2015-07-12 DIAGNOSIS — M533 Sacrococcygeal disorders, not elsewhere classified: Secondary | ICD-10-CM | POA: Diagnosis not present

## 2015-07-12 DIAGNOSIS — G43909 Migraine, unspecified, not intractable, without status migrainosus: Secondary | ICD-10-CM | POA: Diagnosis not present

## 2015-07-12 DIAGNOSIS — M47818 Spondylosis without myelopathy or radiculopathy, sacral and sacrococcygeal region: Secondary | ICD-10-CM

## 2015-07-12 DIAGNOSIS — M1612 Unilateral primary osteoarthritis, left hip: Secondary | ICD-10-CM | POA: Diagnosis not present

## 2015-07-12 MED ORDER — LIDOCAINE HCL (PF) 1 % IJ SOLN
10.0000 mL | Freq: Once | INTRAMUSCULAR | Status: AC
  Administered 2015-07-12: 10 mL

## 2015-07-12 MED ORDER — METHYLPREDNISOLONE ACETATE 80 MG/ML IJ SUSP
80.0000 mg | Freq: Once | INTRAMUSCULAR | Status: AC
  Administered 2015-07-12: 80 mg
  Filled 2015-07-12: qty 1

## 2015-07-12 MED ORDER — ROPIVACAINE HCL 2 MG/ML IJ SOLN
4.0000 mL | Freq: Once | INTRAMUSCULAR | Status: AC
  Administered 2015-07-12: 4 mL
  Filled 2015-07-12: qty 10

## 2015-07-12 MED ORDER — METHYLPREDNISOLONE ACETATE 80 MG/ML IJ SUSP
INTRAMUSCULAR | Status: AC
  Filled 2015-07-12: qty 1

## 2015-07-12 NOTE — Patient Instructions (Signed)
Sacroiliac (SI) Joint Injection Patient Information  Description: The sacroiliac joint connects the scrum (very low back and tailbone) to the ilium (a pelvic bone which also forms half of the hip joint).  Normally this joint experiences very little motion.  When this joint becomes inflamed or unstable low back and or hip and pelvis pain may result.  Injection of this joint with local anesthetics (numbing medicines) and steroids can provide diagnostic information and reduce pain.  This injection is performed with the aid of x-ray guidance into the tailbone area while you are lying on your stomach.   You may experience an electrical sensation down the leg while this is being done.  You may also experience numbness.  We also may ask if we are reproducing your normal pain during the injection.  Conditions which may be treated SI injection:   Low back, buttock, hip or leg pain  Preparation for the Injection:  1. Do not eat any solid food or dairy products within 8 hours of your appointment.  2. You may drink clear liquids up to 3 hours before appointment.  Clear liquids include water, black coffee, juice or soda.  No milk or cream please. 3. You may take your regular medications, including pain medications with a sip of water before your appointment.  Diabetics should hold regular insulin (if take separately) and take 1/2 normal NPH dose the morning of the procedure.  Carry some sugar containing items with you to your appointment. 4. A driver must accompany you and be prepared to drive you home after your procedure. 5. Bring all of your current medications with you. 6. An IV may be inserted and sedation may be given at the discretion of the physician. 7. A blood pressure cuff, EKG and other monitors will often be applied during the procedure.  Some patients may need to have extra oxygen administered for a short period.  8. You will be asked to provide medical information, including your allergies,  prior to the procedure.  We must know immediately if you are taking blood thinners (like Coumadin/Warfarin) or if you are allergic to IV iodine contrast (dye).  We must know if you could possible be pregnant.  Possible side effects:   Bleeding from needle site  Infection (rare, may require surgery)  Nerve injury (rare)  Numbness & tingling (temporary)  A brief convulsion or seizure  Light-headedness (temporary)  Pain at injection site (several days)  Decreased blood pressure (temporary)  Weakness in the leg (temporary)   Call if you experience:   New onset weakness or numbness of an extremity below the injection site that last more than 8 hours.  Hives or difficulty breathing ( go to the emergency room)  Inflammation or drainage at the injection site  Any new symptoms which are concerning to you  Please note:  Although the local anesthetic injected can often make your back/ hip/ buttock/ leg feel good for several hours after the injections, the pain will likely return.  It takes 3-7 days for steroids to work in the sacroiliac area.  You may not notice any pain relief for at least that one week.  If effective, we will often do a series of three injections spaced 3-6 weeks apart to maximally decrease your pain.  After the initial series, we generally will wait some months before a repeat injection of the same type.  If you have any questions, please call [phone] [place]  COMPLICATIONS  What are the risk, side effects and possible complications? Generally speaking, most procedures are safe.  However, with any procedure there are risks, side effects, and the possibility of complications.  The risks and complications are dependent upon the sites that are lesioned, or the type of nerve block to be performed.  The closer the procedure is to the spine, the more serious the risks are.  Great care is taken when placing  the radio frequency needles, block needles or lesioning probes, but sometimes complications can occur. 1. Infection: Any time there is an injection through the skin, there is a risk of infection.  This is why sterile conditions are used for these blocks.  There are four possible types of infection. 1. Localized skin infection. 2. Central Nervous System Infection-This can be in the form of Meningitis, which can be deadly. 3. Epidural Infections-This can be in the form of an epidural abscess, which can cause pressure inside of the spine, causing compression of the spinal cord with subsequent paralysis. This would require an emergency surgery to decompress, and there are no guarantees that the patient would recover from the paralysis. 4. Discitis-This is an infection of the intervertebral discs.  It occurs in about 1% of discography procedures.  It is difficult to treat and it may lead to surgery.        2. Pain: the needles have to go through skin and soft tissues, will cause soreness.       3. Damage to internal structures:  The nerves to be lesioned may be near blood vessels or    other nerves which can be potentially damaged.       4. Bleeding: Bleeding is more common if the patient is taking blood thinners such as  aspirin, Coumadin, Ticiid, Plavix, etc., or if he/she have some genetic predisposition  such as hemophilia. Bleeding into the spinal canal can cause compression of the spinal  cord with subsequent paralysis.  This would require an emergency surgery to  decompress and there are no guarantees that the patient would recover from the  paralysis.       5. Pneumothorax:  Puncturing of a lung is a possibility, every time a needle is introduced in  the area of the chest or upper back.  Pneumothorax refers to free air around the  collapsed lung(s), inside of the thoracic cavity (chest cavity).  Another two possible  complications related to a similar event would include: Hemothorax and Chylothorax.    These are variations of the Pneumothorax, where instead of air around the collapsed  lung(s), you may have blood or chyle, respectively.       6. Spinal headaches: They may occur with any procedures in the area of the spine.       7. Persistent CSF (Cerebro-Spinal Fluid) leakage: This is a rare problem, but may occur  with prolonged intrathecal or epidural catheters either due to the formation of a fistulous  track or a dural tear.       8. Nerve damage: By working so close to the spinal cord, there is always a possibility of  nerve damage, which could be as serious as a permanent spinal cord injury with  paralysis.       9. Death:  Although rare, severe deadly allergic reactions known as "Anaphylactic  reaction" can occur to any of the medications used.      10. Worsening of the symptoms:  We can always make thing worse.  What   are the chances of something like this happening? Chances of any of this occuring are extremely low.  By statistics, you have more of a chance of getting killed in a motor vehicle accident: while driving to the hospital than any of the above occurring .  Nevertheless, you should be aware that they are possibilities.  In general, it is similar to taking a shower.  Everybody knows that you can slip, hit your head and get killed.  Does that mean that you should not shower again?  Nevertheless always keep in mind that statistics do not mean anything if you happen to be on the wrong side of them.  Even if a procedure has a 1 (one) in a 1,000,000 (million) chance of going wrong, it you happen to be that one..Also, keep in mind that by statistics, you have more of a chance of having something go wrong when taking medications.  Who should not have this procedure? If you are on a blood thinning medication (e.g. Coumadin, Plavix, see list of "Blood Thinners"), or if you have an active infection going on, you should not have the procedure.  If you are taking any blood thinners, please inform  your physician.  How should I prepare for this procedure?  Do not eat or drink anything at least six hours prior to the procedure.  Bring a driver with you .  It cannot be a taxi.  Come accompanied by an adult that can drive you back, and that is strong enough to help you if your legs get weak or numb from the local anesthetic.  Take all of your medicines the morning of the procedure with just enough water to swallow them.  If you have diabetes, make sure that you are scheduled to have your procedure done first thing in the morning, whenever possible.  If you have diabetes, take only half of your insulin dose and notify our nurse that you have done so as soon as you arrive at the clinic.  If you are diabetic, but only take blood sugar pills (oral hypoglycemic), then do not take them on the morning of your procedure.  You may take them after you have had the procedure.  Do not take aspirin or any aspirin-containing medications, at least eleven (11) days prior to the procedure.  They may prolong bleeding.  Wear loose fitting clothing that may be easy to take off and that you would not mind if it got stained with Betadine or blood.  Do not wear any jewelry or perfume  Remove any nail coloring.  It will interfere with some of our monitoring equipment.  NOTE: Remember that this is not meant to be interpreted as a complete list of all possible complications.  Unforeseen problems may occur.  BLOOD THINNERS The following drugs contain aspirin or other products, which can cause increased bleeding during surgery and should not be taken for 2 weeks prior to and 1 week after surgery.  If you should need take something for relief of minor pain, you may take acetaminophen which is found in Tylenol,m Datril, Anacin-3 and Panadol. It is not blood thinner. The products listed below are.  Do not take any of the products listed below in addition to any listed on your instruction sheet.  A.P.C or A.P.C  with Codeine Codeine Phosphate Capsules #3 Ibuprofen Ridaura  ABC compound Congesprin Imuran rimadil  Advil Cope Indocin Robaxisal  Alka-Seltzer Effervescent Pain Reliever and Antacid Coricidin or Coricidin-D  Indomethacin Rufen  Alka-Seltzer   plus Cold Medicine Cosprin Ketoprofen S-A-C Tablets  Anacin Analgesic Tablets or Capsules Coumadin Korlgesic Salflex  Anacin Extra Strength Analgesic tablets or capsules CP-2 Tablets Lanoril Salicylate  Anaprox Cuprimine Capsules Levenox Salocol  Anexsia-D Dalteparin Magan Salsalate  Anodynos Darvon compound Magnesium Salicylate Sine-off  Ansaid Dasin Capsules Magsal Sodium Salicylate  Anturane Depen Capsules Marnal Soma  APF Arthritis pain formula Dewitt's Pills Measurin Stanback  Argesic Dia-Gesic Meclofenamic Sulfinpyrazone  Arthritis Bayer Timed Release Aspirin Diclofenac Meclomen Sulindac  Arthritis pain formula Anacin Dicumarol Medipren Supac  Analgesic (Safety coated) Arthralgen Diffunasal Mefanamic Suprofen  Arthritis Strength Bufferin Dihydrocodeine Mepro Compound Suprol  Arthropan liquid Dopirydamole Methcarbomol with Aspirin Synalgos  ASA tablets/Enseals Disalcid Micrainin Tagament  Ascriptin Doan's Midol Talwin  Ascriptin A/D Dolene Mobidin Tanderil  Ascriptin Extra Strength Dolobid Moblgesic Ticlid  Ascriptin with Codeine Doloprin or Doloprin with Codeine Momentum Tolectin  Asperbuf Duoprin Mono-gesic Trendar  Aspergum Duradyne Motrin or Motrin IB Triminicin  Aspirin plain, buffered or enteric coated Durasal Myochrisine Trigesic  Aspirin Suppositories Easprin Nalfon Trillsate  Aspirin with Codeine Ecotrin Regular or Extra Strength Naprosyn Uracel  Atromid-S Efficin Naproxen Ursinus  Auranofin Capsules Elmiron Neocylate Vanquish  Axotal Emagrin Norgesic Verin  Azathioprine Empirin or Empirin with Codeine Normiflo Vitamin E  Azolid Emprazil Nuprin Voltaren  Bayer Aspirin plain, buffered or children's or timed BC Tablets or powders  Encaprin Orgaran Warfarin Sodium  Buff-a-Comp Enoxaparin Orudis Zorpin  Buff-a-Comp with Codeine Equegesic Os-Cal-Gesic   Buffaprin Excedrin plain, buffered or Extra Strength Oxalid   Bufferin Arthritis Strength Feldene Oxphenbutazone   Bufferin plain or Extra Strength Feldene Capsules Oxycodone with Aspirin   Bufferin with Codeine Fenoprofen Fenoprofen Pabalate or Pabalate-SF   Buffets II Flogesic Panagesic   Buffinol plain or Extra Strength Florinal or Florinal with Codeine Panwarfarin   Buf-Tabs Flurbiprofen Penicillamine   Butalbital Compound Four-way cold tablets Penicillin   Butazolidin Fragmin Pepto-Bismol   Carbenicillin Geminisyn Percodan   Carna Arthritis Reliever Geopen Persantine   Carprofen Gold's salt Persistin   Chloramphenicol Goody's Phenylbutazone   Chloromycetin Haltrain Piroxlcam   Clmetidine heparin Plaquenil   Cllnoril Hyco-pap Ponstel   Clofibrate Hydroxy chloroquine Propoxyphen         Before stopping any of these medications, be sure to consult the physician who ordered them.  Some, such as Coumadin (Warfarin) are ordered to prevent or treat serious conditions such as "deep thrombosis", "pumonary embolisms", and other heart problems.  The amount of time that you may need off of the medication may also vary with the medication and the reason for which you were taking it.  If you are taking any of these medications, please make sure you notify your pain physician before you undergo any procedures.          

## 2015-07-12 NOTE — Progress Notes (Signed)
Patient's Name: Shannon [name]  Patient type: Established  MRN: [id number]  Service setting: Ambulatory outpatient  DOB: 1965/04/18  Location: [facility]  DOS: 07/12/2015  Primary Care Physician: [name], MD  Note by: Kathlen Brunswick. Dossie Arbour, M.D, DABA, DABAPM, DABPM, Milagros Evener, FIPP  Referring Physician: Milinda Pointer, MD  Specialty: Board-Certified Interventional Pain Management  Last Visit to Pain Management: 07/03/2015   Primary Reason(s) for Visit: Interventional Pain Management Treatment. CC: Back Pain  Primary Diagnosis: Chronic left sacroiliac joint pain [M53.3, G89.29]   Procedure:  Anesthesia, Analgesia, Anxiolysis:  Type: Diagnostic Sacroiliac Joint Steroid Injection Region: Superior Lumbosacral Region Level: PSIS (Posterior Superior Iliac Spine) Laterality: Left-Sided  Indications: 1. Chronic sacroiliac joint pain (Location of Secondary source of pain) (intermittent) (Left)   2. Chronic low back pain (Left)   3. Osteoarthritis of sacroiliac joint (Left)     Pre-procedure Pain Score: 2/10 Reported level of pain is compatible with clinical observations Post-procedure Pain Score: 0-No pain  Type: Local Anesthesia Local Anesthetic: Lidocaine 1% Route: Infiltration ([place]/IM) IV Access: Declined Sedation: Declined  Indication(s): Analgesia     Pre-Procedure Assessment:  Shannon [name] is a 50 y.o. year old, female patient, seen today for interventional treatment. She has Hyperthyroidism; GERD (gastroesophageal reflux disease); Anemia; Migraine; Menopausal symptoms; Health care maintenance; Anxiety; Atypical chest pain; Abdominal pain, epigastric; Gastro-esophageal reflux disease without esophagitis; Chronic pain; Chronic low back pain (Left); Lumbar lateral recess stenosis (L4-5) (Left); Lumbar facet hypertrophy (L4-5) (Left); Chronic lumbar radicular pain (Location of Tertiary source of pain) (intermittent) (L4 Dermatome) (Left); Chronic hip pain  (Location of Primary Source of Pain) (intermittent) (Left); Chronic sacroiliac joint pain (Location of Secondary source of pain) (intermittent) (Left); Nicotine dependence; Tobacco abuse; Osteoarthritis of hip (bone-on-bone) (Left); Lumbar spondylosis; Encounter for chronic pain management; Post-menopausal bleeding; Axillary lump; Muscle ache; Hemorrhage, postmenopausal; and Osteoarthritis of sacroiliac joint (Left) on her problem list.. Her primarily concern today is the Back Pain   Pain Location: Back Pain Orientation: Left Pain Descriptors / Indicators: Aching, Shooting, Sharp Pain Frequency: Intermittent  Date of Last Visit: 07/03/15 Service Provided on Last Visit: Evaluation  Coagulation Parameters Lab Results  Component Value Date   PLT 248.0 03/22/2015    Verification of the correct person, correct site (including marking of site), and correct procedure were performed and confirmed by the patient.  Consent: Secured. Under the influence of no sedatives a written informed consent was obtained, after having provided information on the risks and possible complications. To fulfill our ethical and legal obligations, as recommended by the American Medical Association's Code of Ethics, we have provided information to the patient about our clinical impression; the nature and purpose of the treatment or procedure; the risks, benefits, and possible complications of the intervention; alternatives; the risk(s) and benefit(s) of the alternative treatment(s) or procedure(s); and the risk(s) and benefit(s) of doing nothing. The patient was provided information about the risks and possible complications associated with the procedure. These include, but are not limited to, failure to achieve desired goals, infection, bleeding, organ or nerve damage, allergic reactions, paralysis, and death. In the case of intra- or periarticular procedures these may include, but are not limited to, failure to achieve  desired goals, infection, bleeding (hemarthrosis), organ or nerve damage, allergic reactions, and death. In addition, the patient was informed that Medicine is not an exact science; therefore, there is also the possibility of unforeseen risks and possible complications that may result in a catastrophic outcome. The patient indicated having  understood very clearly. We have given the patient no guarantees and we have made no promises. Enough time was given to the patient to ask questions, all of which were answered to the patient's satisfaction.  Consent Attestation: I, the ordering provider, attest that I have discussed with the patient the benefits, risks, side-effects, alternatives, likelihood of achieving goals, and potential problems during recovery for the procedure that I have provided informed consent.  Pre-Procedure Preparation: Safety Precautions: Allergies reviewed. Appropriate site, procedure, and patient were confirmed by following the Joint Commission's Universal Protocol (UP.01.01.01), in the form of a "Time Out". The patient was asked to confirm marked site and procedure, before commencing. The patient was asked about blood thinners, or active infections, both of which were denied. Patient was assessed for positional comfort and all pressure points were checked before starting procedure. Allergies: She is allergic to codeine; decongest-aid; flagyl; and sulfa antibiotics.. Infection Control Precautions: Sterile technique used. Standard Universal Precautions were taken as recommended by the Department of [facility] for Disease Control and Prevention (CDC). Standard pre-surgical skin prep was conducted. Respiratory hygiene and cough etiquette was practiced. Hand hygiene observed. Safe injection practices and needle disposal techniques followed. SDV (single dose vial) medications used. Medications properly checked for expiration dates and contaminants. Personal protective equipment (PPE)  used: Sterile Radiation-resistant gloves. Monitoring:  As per clinic protocol. Filed Vitals:   07/12/15 1327 07/12/15 1333 07/12/15 1338 07/12/15 1340  BP: 137/80 139/84 149/82 135/83  Pulse: 65 74 74 72  Temp:      TempSrc:      Resp: '13 24 20 17  ' Height:      Weight:      SpO2: 100% 100% 99% 100%  Calculated BMI: Body mass index is 22.49 kg/(m^2).  Description of Procedure Process:  Time-out: "Time-out" completed before starting procedure, as per protocol. Position: Prone Target Area: Superior, posterior, aspect of the sacroiliac fissure Approach: Posterior, paraspinal, ipsilateral approach. Area Prepped: Entire Lower Lumbosacral Region Prepping solution: ChloraPrep (2% chlorhexidine gluconate and 70% isopropyl alcohol) Safety Precautions: Aspiration looking for blood return was conducted prior to all injections. At no point did we inject any substances, as a needle was being advanced. No attempts were made at seeking any paresthesias. Safe injection practices and needle disposal techniques used. Medications properly checked for expiration dates. SDV (single dose vial) medications used.   Description of the Procedure: Protocol guidelines were followed. The patient was placed in position over the procedure table. The target area was identified and the area prepped in the usual manner. Skin & deeper tissues infiltrated with local anesthetic. Appropriate amount of time allowed to pass for local anesthetics to take effect. The procedure needle was advanced under fluoroscopic guidance into the sacroiliac joint until a firm endpoint was obtained. Proper needle placement secured. Negative aspiration confirmed. Solution injected in intermittent fashion, asking for systemic symptoms every 0.5cc of injectate. The needles were then removed and the area cleansed, making sure to leave some of the prepping solution back to take advantage of its long term bactericidal properties. EBL: None Materials &  Medications Used:  Needle(s) Used: 22g - 3.5" Spinal Needle(s) Solution Injected: 0.2% PF-Ropivacaine (27m) + SDV-DepoMedrol 80 mg/ml (111m Medications Administered today: We administered methylPREDNISolone acetate, ropivacaine (PF) 2 mg/ml (0.2%), and lidocaine (PF).Please see chart orders for dosing details.  Imaging Guidance  Type of Imaging Technique: Fluoroscopy Guidance (Spinal) Indication(s): Assistance in needle guidance and placement for procedures requiring needle placement in or near specific anatomical locations  not easily accessible without such assistance. Exposure Time: Please see nurses notes. Contrast: None used. See chart for type and volume of contrast used. Fluoroscopic Guidance: I was personally present in the fluoroscopy suite, where the patient was placed in position for the procedure, over the fluoroscopy-compatible table. Fluoroscopy was manipulated, using "Tunnel Vision Technique", to obtain the best possible view of the target area, on the affected side. Parallax error was corrected before commencing the procedure. A "direction-depth-direction" technique was used to introduce the needle under continuous pulsed fluoroscopic guidance. Once the target was reached, antero-posterior, oblique, and lateral fluoroscopic projection views were taken to confirm needle placement in all planes. Permanently recorded images stored by scanning into EMR. Interpretation: No contrast injected. Intraoperative imaging interpretation by performing Physician. Adequate needle placement confirmed. Permanent hardcopy images in multiple planes scanned into the patient's record.  Antibiotic Prophylaxis:  Indication(s): No indications identified. Type:  Antibiotics Given (last 72 hours)    None       Post-operative Assessment  Complications: No immediate post-treatment complications were observed. Disposition: The patient was discharged home, once institutional criteria were met. Return to  clinic in 2 weeks for follow-up evaluation and interpretation of results. The patient tolerated the entire procedure well. A repeat set of vitals were taken after the procedure and the patient was kept under observation following institutional policy, for this type of procedure. Post-procedural neurological assessment was performed, showing return to baseline, prior to discharge. The patient was provided with post-procedure discharge instructions, including a section on how to identify potential problems. Should any problems arise concerning this procedure, the patient was given instructions to immediately contact us, at any time, without hesitation. In any case, we plan to contact the patient by telephone for a follow-up status report regarding this interventional procedure. Comments:  No additional relevant information.  Medications administered during this visit: We administered methylPREDNISolone acetate, ropivacaine (PF) 2 mg/ml (0.2%), and lidocaine (PF).  Prescriptions ordered during this visit: New Prescriptions   No medications on file    Requested PM Follow-up: Return for Post-Procedure Eval (2 weeks).  Future Appointments Date Time Provider [place]  08/22/2015 10:40 AM Milinda Pointer, MD [facility] None  09/25/2015 10:00 AM [name], MD [facility] None    Primary Care Physician: [name], MD Location: [facility] Note by: Kathlen Brunswick. Dossie Arbour, M.D, DABA, DABAPM, DABPM, DABIPP, FIPP  Disclaimer:  Medicine is not an exact science. The only guarantee in medicine is that nothing is guaranteed. It is important to note that the decision to proceed with this intervention was based on the information collected from the patient. The Data and conclusions were drawn from the patient's questionnaire, the interview, and the physical examination. Because the information was provided in large part by the patient, it cannot be guaranteed that it  has not been purposely or unconsciously manipulated. Every effort has been made to obtain as much relevant data as possible for this evaluation. It is important to note that the conclusions that lead to this procedure are derived in large part from the available data. Always take into account that the treatment will also be dependent on availability of resources and existing treatment guidelines, considered by other Pain Management Practitioners as being common knowledge and practice, at the time of the intervention. For Medico-Legal purposes, it is also important to point out that variation in procedural techniques and pharmacological choices are the acceptable norm. The indications, contraindications, technique, and results of the above procedure should only  be interpreted and judged by a Board-Certified Interventional Pain Specialist with extensive familiarity and expertise in the same exact procedure and technique. Attempts at providing opinions without similar or greater experience and expertise than that of the treating physician will be considered as inappropriate and unethical, and shall result in a formal complaint to the state medical board and applicable specialty societies.

## 2015-07-12 NOTE — Progress Notes (Signed)
Safety precautions to be maintained throughout the outpatient stay will include: orient to surroundings, keep bed in low position, maintain call bell within reach at all times, provide assistance with transfer out of bed and ambulation.  

## 2015-07-13 ENCOUNTER — Telehealth: Payer: Self-pay

## 2015-07-13 NOTE — Telephone Encounter (Signed)
Post procedure phone call. Patient states she is doing good.  

## 2015-07-30 ENCOUNTER — Other Ambulatory Visit: Payer: Self-pay

## 2015-07-30 MED ORDER — ALPRAZOLAM 0.25 MG PO TABS: ORAL_TABLET | ORAL | Status: DC

## 2015-07-30 NOTE — Telephone Encounter (Signed)
Medication refilled

## 2015-08-01 NOTE — Telephone Encounter (Signed)
Rx faxed to [facility].

## 2015-08-22 ENCOUNTER — Ambulatory Visit: Payer: 59 | Attending: Pain Medicine | Admitting: Pain Medicine

## 2015-08-22 ENCOUNTER — Encounter: Payer: Self-pay | Admitting: Pain Medicine

## 2015-08-22 VITALS — BP 117/72 | HR 70 | Temp 98.0°F | Resp 16 | Ht 62.0 in | Wt 124.0 lb

## 2015-08-22 DIAGNOSIS — F419 Anxiety disorder, unspecified: Secondary | ICD-10-CM | POA: Insufficient documentation

## 2015-08-22 DIAGNOSIS — M545 Low back pain: Secondary | ICD-10-CM | POA: Insufficient documentation

## 2015-08-22 DIAGNOSIS — R0789 Other chest pain: Secondary | ICD-10-CM | POA: Insufficient documentation

## 2015-08-22 DIAGNOSIS — M1612 Unilateral primary osteoarthritis, left hip: Secondary | ICD-10-CM

## 2015-08-22 DIAGNOSIS — D649 Anemia, unspecified: Secondary | ICD-10-CM | POA: Insufficient documentation

## 2015-08-22 DIAGNOSIS — E0590 Thyrotoxicosis, unspecified without thyrotoxic crisis or storm: Secondary | ICD-10-CM | POA: Insufficient documentation

## 2015-08-22 DIAGNOSIS — G8929 Other chronic pain: Secondary | ICD-10-CM

## 2015-08-22 DIAGNOSIS — Z780 Asymptomatic menopausal state: Secondary | ICD-10-CM | POA: Insufficient documentation

## 2015-08-22 DIAGNOSIS — M533 Sacrococcygeal disorders, not elsewhere classified: Secondary | ICD-10-CM | POA: Diagnosis not present

## 2015-08-22 DIAGNOSIS — Z87891 Personal history of nicotine dependence: Secondary | ICD-10-CM | POA: Diagnosis not present

## 2015-08-22 DIAGNOSIS — M4806 Spinal stenosis, lumbar region: Secondary | ICD-10-CM | POA: Diagnosis not present

## 2015-08-22 DIAGNOSIS — N950 Postmenopausal bleeding: Secondary | ICD-10-CM | POA: Diagnosis not present

## 2015-08-22 DIAGNOSIS — M47816 Spondylosis without myelopathy or radiculopathy, lumbar region: Secondary | ICD-10-CM | POA: Diagnosis not present

## 2015-08-22 DIAGNOSIS — G43909 Migraine, unspecified, not intractable, without status migrainosus: Secondary | ICD-10-CM | POA: Diagnosis not present

## 2015-08-22 DIAGNOSIS — K219 Gastro-esophageal reflux disease without esophagitis: Secondary | ICD-10-CM | POA: Insufficient documentation

## 2015-08-22 DIAGNOSIS — R1013 Epigastric pain: Secondary | ICD-10-CM | POA: Diagnosis not present

## 2015-08-22 DIAGNOSIS — M25552 Pain in left hip: Secondary | ICD-10-CM

## 2015-08-22 NOTE — Progress Notes (Signed)
Patient's Name: Shannon [name]  Patient type: Established  MRN: [id number]  Service setting: Ambulatory outpatient  DOB: 1965-04-07  Location: [facility]  DOS: 08/22/2015  Primary Care Physician: Shannon Pheasant, MD  Note by: Kathlen Brunswick. Dossie Arbour, M.D, DABA, DABAPM, DABPM, Milagros Evener, FIPP  Referring Physician: [name], MD  Specialty: Board-Certified Interventional Pain Management  Last Visit to Pain Management: 07/13/2015   Primary Reason(s) for Visit: Encounter for post-procedure evaluation of chronic illness with mild to moderate exacerbation CC: Hip Pain (left)   HPI  Shannon [name] is a 50 y.o. year old, female patient, who returns today as an established patient. She has Hyperthyroidism; GERD (gastroesophageal reflux disease); Anemia; Migraine; Menopausal symptoms; Health care maintenance; Anxiety; Atypical chest pain; Abdominal pain, epigastric; Gastro-esophageal reflux disease without esophagitis; Chronic pain; Chronic low back pain (Left); Lumbar lateral recess stenosis (L4-5) (Left); Lumbar facet hypertrophy (L4-5) (Left); Chronic lumbar radicular pain (Location of Tertiary source of pain) (intermittent) (L4 Dermatome) (Left); Chronic hip pain (Location of Primary Source of Pain) (intermittent) (Left); Chronic sacroiliac joint pain (Location of Secondary source of pain) (intermittent) (Left); Nicotine dependence; Tobacco abuse; Osteoarthritis of hip (bone-on-bone) (Left); Lumbar spondylosis; Encounter for chronic pain management; Post-menopausal bleeding; Axillary lump; Muscle ache; Hemorrhage, postmenopausal; and Osteoarthritis of sacroiliac joint (Left) on her problem list.. Her primarily concern today is the Hip Pain (left)   Pain Assessment: Self-Reported Pain Score: 2              Reported level is compatible with observation       Pain Type: Chronic pain Pain Location: Hip Pain Orientation: Left Pain Descriptors / Indicators: Aching, Shooting, Sharp Pain  Frequency: Intermittent  The patient comes into the clinics today for post-procedure evaluation on the interventional treatment done on 07/12/2015. The patient indicates doing much better and not having any SI joint pain. However she still having some hip pain and she is pending to see the orthopedic surgeon next Monday. Her x-rays demonstrate her hip joint to be bone-on-bone and at this time I'm convinced that she will probably need hip replacement. She is not taking any medications from our service.  Date of Last Visit: 07/12/15 Service Provided on Last Visit: Procedure (left SIJI)  Post-Procedure Assessment  Procedure done on last visit: Palliative left sacroiliac joint injection under fluoroscopic guidance, no sedation. Side-effects or Adverse reactions: None reported Sedation: No sedation used  Results: Ultra-Short Term Relief (First 1 hour after procedure): 100 %  Analgesia during this period is likely to be Local Anesthetic and/or IV Sedative (Analgesic/Anxiolitic) related Short Term Relief (Initial 4-6 hrs after procedure): 100 % Complete relief would confirms area to be the source of pain Long Term Relief : 100 % (ongoing) Long-term benefit would suggest an inflammatory etiology to the pain   Current Relief (Now): 100% of the SI joint pain. The hip still hurts.   Persistent relief would suggest effective anti-inflammatory effects from steroids Interpretation of Results: The results of this therapy again confirm that this is a good palliative way of controlling her low back pain and SI joint pain.  Laboratory Chemistry  Inflammation Markers Lab Results  Component Value Date   ESRSEDRATE 8 05/22/2015    Renal Function Lab Results  Component Value Date   BUN 11 03/22/2015   CREATININE 0.74 03/22/2015   GFRAA >60 05/02/2014   GFRNONAA >60 05/02/2014    Hepatic Function Lab Results  Component Value Date   AST 17 03/22/2015   ALT 15 03/22/2015  ALBUMIN 4.4 03/22/2015     Electrolytes Lab Results  Component Value Date   NA 140 03/22/2015   K 3.9 03/22/2015   CL 105 03/22/2015   CALCIUM 9.9 03/22/2015    Pain Modulating Vitamins No results found for: Maralyn Sago [id number], [id number], [id number], 25OHVITD1, 25OHVITD2, 25OHVITD3, VITAMINB12  Coagulation Parameters Lab Results  Component Value Date   PLT 248.0 03/22/2015    Cardiovascular Lab Results  Component Value Date   HGB 13.8 03/22/2015   HCT 40.6 03/22/2015    Note: Lab results reviewed.  Recent Diagnostic Imaging  Mm Digital Screening Bilateral  Result Date: 11/13/2014 CLINICAL DATA:  Screening. EXAM: DIGITAL SCREENING BILATERAL MAMMOGRAM WITH CAD COMPARISON:  Previous exam(s). ACR Breast Density Category d: The breast tissue is extremely dense, which lowers the sensitivity of mammography. FINDINGS: There are no findings suspicious for malignancy. Images were processed with CAD. IMPRESSION: No mammographic evidence of malignancy. A result letter of this screening mammogram will be mailed directly to the patient. RECOMMENDATION: Screening mammogram in one year. (Code:SM-B-01Y) BI-RADS CATEGORY  1: Negative. Electronically Signed   By: Lovey Newcomer M.D.   On: 11/13/2014 11:36    Meds  The patient has a current medication list which includes the following prescription(s): acetaminophen, alprazolam, omeprazole, and ranitidine.  Current Outpatient Prescriptions on File Prior to Visit  Medication Sig  . acetaminophen (TYLENOL) 500 MG tablet Take 1,000 mg by mouth every 6 (six) hours as needed for moderate pain.  Marland Kitchen ALPRAZolam (XANAX) 0.25 MG tablet TAKE ONE TABLET BY MOUTH AT BEDTIME AS NEEDED FOR ANXIETY  . omeprazole (PRILOSEC) 40 MG capsule Take 40 mg by mouth daily.  . ranitidine (ZANTAC) 150 MG tablet Take 150 mg by mouth at bedtime.   No current facility-administered medications on file prior to visit.     ROS  Constitutional: Denies any fever or chills Gastrointestinal: No  reported hemesis, hematochezia, vomiting, or acute GI distress Musculoskeletal: Denies any acute onset joint swelling, redness, loss of ROM, or weakness Neurological: No reported episodes of acute onset apraxia, aphasia, dysarthria, agnosia, amnesia, paralysis, loss of coordination, or loss of consciousness  Allergies  Ms. Ruffcorn is allergic to codeine; decongest-aid [pseudoephedrine]; flagyl [metronidazole]; and sulfa antibiotics.  [place]  Medical:  Ms. Nastri  has a past medical history of Anemia; Breast pain, right (11/07/2012); Breathing difficulty (06/20/2014); Environmental allergies; GERD (gastroesophageal reflux disease); Hiatal hernia; History of migraine headaches; Hyperthyroidism; and Vaginitis (01/16/2013). Family: family history includes Breast cancer in her mother; Heart disease in her father and maternal grandfather; Hypercholesterolemia in her father and mother; Hypertension in her father; Kidney cancer in her father; Leukemia in her father; Lung cancer in her paternal grandfather; Pancreatic cancer in her paternal grandfather; Prostate cancer in her paternal grandfather; Thyroid disease in her father and mother. Surgical:  has a past surgical history that includes Septoplasty (5/90); Nasal sinus surgery (11/93); Cesarean section (11/98); Tubal ligation (02/1998); Umbilical hernia repair (02/1998); and Cholecystectomy (11/05). Tobacco:  reports that she has quit smoking. She has never used smokeless tobacco. Alcohol:  reports that she does not drink alcohol. Drug:  reports that she does not use drugs.  Constitutional Exam  Vitals: Blood pressure 117/72, pulse 70, temperature 98 F (36.7 C), resp. rate 16, height 5\' 2"  (1.575 m), weight 124 lb (56.2 kg), last menstrual period 02/12/2013, SpO2 100 %. General appearance: Well nourished, well developed, and well hydrated. In no acute distress Calculated BMI/Body habitus: Body mass index is 22.68 kg/m. (18.5-24.9 kg/m2) Ideal body  weight Psych/Mental status: Alert and oriented x 3 (person, place, & time) Eyes: PERLA Respiratory: No evidence of acute respiratory distress  Cervical Spine Exam  Inspection: No masses, redness, or swelling Alignment: Symmetrical ROM: Functional: ROM is within functional limits [facility]) Stability: No instability detected Muscle strength & Tone: Functionally intact Sensory: Unimpaired Palpation: No complaints of tenderness  Upper Extremity (UE) Exam    Side: Right upper extremity  Side: Left upper extremity  Inspection: No masses, redness, swelling, or asymmetry  Inspection: No masses, redness, swelling, or asymmetry  ROM:  ROM:  Functional: ROM is within functional limits [facility])        Functional: ROM is within functional limits [facility])        Muscle strength & Tone: Functionally intact  Muscle strength & Tone: Functionally intact  Sensory: Unimpaired  Sensory: Unimpaired  Palpation: No complaints of tenderness  Palpation: No complaints of tenderness   Thoracic Spine Exam  Inspection: No masses, redness, or swelling Alignment: Symmetrical ROM: Functional: ROM is within functional limits [facility]) Stability: No instability detected Sensory: Unimpaired Muscle strength & Tone: Functionally intact Palpation: No complaints of tenderness  Lumbar Spine Exam  Inspection: No masses, redness, or swelling Alignment: Symmetrical ROM: Functional: ROM is within functional limits [facility]) Stability: No instability detected Muscle strength & Tone: Functionally intact Sensory: Unimpaired Palpation: No complaints of tenderness Provocative Tests: Lumbar Hyperextension and rotation test: provocative test deferred today       Patrick's Maneuver: Positive       for left hip joint pain.  Gait & Posture Assessment  Ambulation: Unassisted Gait: Antalgic Posture: WNL   Lower Extremity Exam    Side: Right lower extremity  Side: Left lower extremity  Inspection: No masses, redness, swelling, or  asymmetry ROM:  Inspection: No masses, redness, swelling, or asymmetry ROM:  Functional: ROM is within functional limits [facility])        Functional: ROM is within functional limits [facility])        Muscle strength & Tone: Functionally intact  Muscle strength & Tone: Functionally intact  Sensory: Unimpaired  Sensory: Unimpaired  Palpation: No complaints of tenderness  Palpation: No complaints of tenderness}   Assessment & Plan  Primary Diagnosis & Pertinent Problem List: The primary encounter diagnosis was Chronic pain. Diagnoses of Chronic hip pain (Location of Primary Source of Pain) (intermittent) (Left), Chronic sacroiliac joint pain (Location of Secondary source of pain) (intermittent) (Left), and Primary osteoarthritis of left hip were also pertinent to this visit.  Visit Diagnosis: 1. Chronic pain   2. Chronic hip pain (Location of Primary Source of Pain) (intermittent) (Left)   3. Chronic sacroiliac joint pain (Location of Secondary source of pain) (intermittent) (Left)   4. Primary osteoarthritis of left hip     Problem-specific Plan(s): No problem-specific Assessment & Plan notes found for this encounter.   Plan of Care   Problem List Items Addressed This Visit      High   Chronic hip pain (Location of Primary Source of Pain) (intermittent) (Left) (Chronic)   Chronic pain - Primary (Chronic)   Chronic sacroiliac joint pain (Location of Secondary source of pain) (intermittent) (Left) (Chronic)   Osteoarthritis of hip (bone-on-bone) (Left) (Chronic)    Other Visit Diagnoses   None.      Pharmacotherapy (Medications Ordered): No orders of the defined types were placed in this encounter.   Lab-work & Procedure Ordered: No orders of the defined types were placed in this encounter.   Imaging Ordered: None  Interventional Therapies: Scheduled:  None at this time.    Considering:   1. Palliative left-sided sacroiliac joint block under fluoroscopic guidance, no sedation.   2. Palliative left-sided hip joint injection under fluoroscopic guidance, no sedation.  3. Diagnostic bilateral lumbar facet block under fluoroscopic guidance and IV sedation.  4. Possible left-sided sacroiliac joint radiofrequency ablation under fluoroscopic guidance and IV sedation.  5. Palliative left-sided lumbar epidural steroid injection under fluoroscopic guidance, no sedation.    PRN Procedures:   1. Palliative left-sided sacroiliac joint block under fluoroscopic guidance, no sedation.  2. Palliative left-sided hip joint injection under fluoroscopic guidance, no sedation.  3. Diagnostic bilateral lumbar facet block under fluoroscopic guidance and IV sedation.  4. Palliative left-sided lumbar epidural steroid injection under fluoroscopic guidance, no sedation.    Referral(s) or Consult(s): None at this time.  Medications administered during this visit: Ms. Ordonez had no medications administered during this visit.  Requested PM Follow-up: Return if symptoms worsen or fail to improve, for (PRN) Procedure.  Future Appointments Date Time Provider [place]  09/25/2015 10:00 AM Shannon Pheasant, MD [facility] None    Primary Care Physician: Shannon Pheasant, MD Location: [facility] Note by: Kathlen Brunswick. Dossie Arbour, M.D, DABA, DABAPM, DABPM, DABIPP, FIPP  Pain Score Disclaimer: We use the NRS-11 scale. This is a self-reported, subjective measurement of pain severity with only modest accuracy. It is used primarily to identify changes within a particular patient. It must be understood that outpatient pain scales are significantly less accurate that those used for research, where they can be applied under ideal controlled circumstances with minimal exposure to variables. In reality, the score is likely to be a combination of pain intensity and pain affect, where pain affect describes the degree of emotional arousal or changes in action readiness caused by the  sensory experience of pain. Factors such as social and work situation, setting, emotional state, anxiety levels, expectation, and prior pain experience may influence pain perception and show large inter-individual differences that may also be affected by time variables.  Patient instructions provided during this appointment: There are no Patient Instructions on file for this visit.

## 2015-09-25 ENCOUNTER — Ambulatory Visit: Payer: 59 | Admitting: Internal Medicine

## 2015-09-27 ENCOUNTER — Encounter: Payer: Self-pay | Admitting: Internal Medicine

## 2015-09-28 MED ORDER — ALPRAZOLAM 0.25 MG PO TABS: ORAL_TABLET | ORAL | 1 refills | Status: DC

## 2015-09-28 NOTE — Telephone Encounter (Signed)
rx ok'd for alprazolam #30 with one refill per pt my chart request.

## 2015-09-28 NOTE — Telephone Encounter (Signed)
Rx faxed to [facility].

## 2015-10-10 ENCOUNTER — Encounter: Payer: Self-pay | Admitting: Internal Medicine

## 2015-10-25 DIAGNOSIS — R142 Eructation: Secondary | ICD-10-CM | POA: Insufficient documentation

## 2015-11-12 DIAGNOSIS — M1612 Unilateral primary osteoarthritis, left hip: Secondary | ICD-10-CM | POA: Insufficient documentation

## 2015-11-12 DIAGNOSIS — Z96642 Presence of left artificial hip joint: Secondary | ICD-10-CM | POA: Insufficient documentation

## 2015-11-15 ENCOUNTER — Encounter: Payer: Self-pay | Admitting: Internal Medicine

## 2015-11-15 ENCOUNTER — Ambulatory Visit (INDEPENDENT_AMBULATORY_CARE_PROVIDER_SITE_OTHER): Payer: 59 | Admitting: Internal Medicine

## 2015-11-15 VITALS — BP 128/78 | HR 77 | Temp 98.1°F | Ht 62.0 in | Wt 130.0 lb

## 2015-11-15 DIAGNOSIS — R2231 Localized swelling, mass and lump, right upper limb: Secondary | ICD-10-CM

## 2015-11-15 DIAGNOSIS — K219 Gastro-esophageal reflux disease without esophagitis: Secondary | ICD-10-CM

## 2015-11-15 DIAGNOSIS — M1612 Unilateral primary osteoarthritis, left hip: Secondary | ICD-10-CM

## 2015-11-15 DIAGNOSIS — F419 Anxiety disorder, unspecified: Secondary | ICD-10-CM

## 2015-11-15 DIAGNOSIS — E0590 Thyrotoxicosis, unspecified without thyrotoxic crisis or storm: Secondary | ICD-10-CM | POA: Diagnosis not present

## 2015-11-15 LAB — TSH: TSH: 1.35 u[IU]/mL (ref 0.35–4.50)

## 2015-11-15 MED ORDER — ALPRAZOLAM 0.25 MG PO TABS: ORAL_TABLET | ORAL | 1 refills | Status: DC

## 2015-11-15 NOTE — Progress Notes (Signed)
Pre visit review using our clinic review tool, if applicable. No additional management support is needed unless otherwise documented below in the visit note. 

## 2015-11-15 NOTE — Progress Notes (Signed)
Patient ID: Shannon [name], female   DOB: Jan 25, 1966, 50 y.o.   MRN: [id number]   Subjective:    Patient ID: Shannon [name], female    DOB: [birth year], 50 y.o.   MRN: [id number]  HPI  Patient here for a scheduled follow up.  She is s/p left total hip arthroplasty 09/25/15.  She continues to f/u with ortho.  States is 80% healed.  Off cane.  Doing well.  She has been having increased problems with acid reflux.  Had antiinflammatories after her surgery.   Feels this aggravated.  She is not taking 40mg  of prilosec in the am and 20mg  of prilosec in the pm.  States was told to take this amount by GI.  Off zantac.  Felt her previous regimen was working.  She is off antiinflammatories now. No abdominal pain.  Bowels stable.  Persistent lesion - right axilla.  Was previously evaluated by Dr Tamala Julian.  Felt to be a cyst.     Past Medical History:  Diagnosis Date  . Anemia   . Breast pain, right 11/07/2012  . Breathing difficulty 06/20/2014  . Environmental allergies   . GERD (gastroesophageal reflux disease)   . Hiatal hernia    small  . History of migraine headaches   . Hyperthyroidism    s/p ablation  . Vaginitis 01/16/2013   Past Surgical History:  Procedure Laterality Date  . CESAREAN SECTION  11/98   Dr Roena Malady  . CHOLECYSTECTOMY  11/05   Dr Pat Patrick  . NASAL SINUS SURGERY  11/93  . SEPTOPLASTY  5/90   Dr Rossie Muskrat  . TUBAL LIGATION  02/1998  . UMBILICAL HERNIA REPAIR  02/1998   Family History  Problem Relation Age of Onset  . Heart disease Father   . Hypertension Father   . Thyroid disease Father   . Hypercholesterolemia Father   . Kidney cancer Father   . Leukemia Father     hairy cell  . Hypercholesterolemia Mother   . Thyroid disease Mother   . Breast cancer Mother   . Breast cancer      maternal great grandmother  . Heart disease Maternal Grandfather     MI - age 61  . Prostate cancer Paternal Grandfather   . Pancreatic cancer Paternal Grandfather   . Lung cancer Paternal  Grandfather    Social History   Social History  . Marital status: Married    Spouse name: N/A  . Number of children: 1  . Years of education: N/A   Social History Main Topics  . Smoking status: Former Research scientist (life sciences)  . Smokeless tobacco: Never Used  . Alcohol use No  . Drug use: No  . Sexual activity: Not Asked   Other Topics Concern  . None   Social History Narrative  . None    Outpatient Encounter Prescriptions as of 11/15/2015  Medication Sig  . acetaminophen (TYLENOL) 500 MG tablet Take 1,000 mg by mouth every 6 (six) hours as needed for moderate pain.  Marland Kitchen ALPRAZolam (XANAX) 0.25 MG tablet TAKE ONE TABLET BY MOUTH AT BEDTIME AS NEEDED FOR ANXIETY  . omeprazole (PRILOSEC) 40 MG capsule Take 40 mg by mouth daily.  . ranitidine (ZANTAC) 150 MG tablet Take 150 mg by mouth at bedtime.  . [DISCONTINUED] ALPRAZolam (XANAX) 0.25 MG tablet TAKE ONE TABLET BY MOUTH AT BEDTIME AS NEEDED FOR ANXIETY   No facility-administered encounter medications on file as of 11/15/2015.     Review of Systems  Constitutional: Negative  for appetite change and unexpected weight change.  HENT: Negative for congestion and sinus pressure.   Respiratory: Negative for cough, chest tightness and shortness of breath.   Cardiovascular: Negative for chest pain, palpitations and leg swelling.  Gastrointestinal: Negative for abdominal pain, diarrhea, nausea and vomiting.       Increased acid reflux as outlined.    Genitourinary: Negative for difficulty urinating and dysuria.  Musculoskeletal: Negative for back pain and joint swelling.  Skin: Negative for color change and rash.  Neurological: Negative for dizziness, light-headedness and headaches.  Psychiatric/Behavioral: Negative for agitation and dysphoric mood.       Objective:     Blood pressure rechecked by me:  128/80  Physical Exam  Constitutional: She appears well-developed and well-nourished. No distress.  HENT:  Nose: Nose normal.    Mouth/Throat: Oropharynx is clear and moist.  Neck: Neck supple. No thyromegaly present.  Cardiovascular: Normal rate and regular rhythm.   Pulmonary/Chest: Breath sounds normal. No respiratory distress. She has no wheezes.  No nipple discharge or nipple retraction present.  Could not appreciate any adnexal masses or tenderness.  Small palpable lesion - right axilla - appears to be c/w cyst.    Abdominal: Soft. Bowel sounds are normal. There is no tenderness.  Musculoskeletal: She exhibits no edema or tenderness.  Lymphadenopathy:    She has no cervical adenopathy.  Skin: No rash noted. No erythema.  Psychiatric: She has a normal mood and affect. Her behavior is normal.    BP 128/78   Pulse 77   Temp 98.1 F (36.7 C) (Oral)   Ht 5\' 2"  (1.575 m)   Wt 130 lb (59 kg)   LMP 02/12/2013   SpO2 98%   BMI 23.78 kg/m  Wt Readings from Last 3 Encounters:  11/15/15 130 lb (59 kg)  08/22/15 124 lb (56.2 kg)  07/12/15 123 lb (55.8 kg)     Lab Results  Component Value Date   WBC 5.1 03/22/2015   HGB 13.8 03/22/2015   HCT 40.6 03/22/2015   PLT 248.0 03/22/2015   GLUCOSE 100 (H) 03/22/2015   CHOL 179 08/30/2014   TRIG 129.0 08/30/2014   HDL 63.70 08/30/2014   LDLCALC 90 08/30/2014   ALT 15 03/22/2015   AST 17 03/22/2015   NA 140 03/22/2015   K 3.9 03/22/2015   CL 105 03/22/2015   CREATININE 0.74 03/22/2015   BUN 11 03/22/2015   CO2 27 03/22/2015   TSH 1.35 11/15/2015    Mm Digital Screening Bilateral  Result Date: 11/13/2014 CLINICAL DATA:  Screening. EXAM: DIGITAL SCREENING BILATERAL MAMMOGRAM WITH CAD COMPARISON:  Previous exam(s). ACR Breast Density Category d: The breast tissue is extremely dense, which lowers the sensitivity of mammography. FINDINGS: There are no findings suspicious for malignancy. Images were processed with CAD. IMPRESSION: No mammographic evidence of malignancy. A result letter of this screening mammogram will be mailed directly to the patient.  RECOMMENDATION: Screening mammogram in one year. (Code:SM-B-01Y) BI-RADS CATEGORY  1: Negative. Electronically Signed   By: Lovey Newcomer M.D.   On: 11/13/2014 11:36       Assessment & Plan:   Problem List Items Addressed This Visit    Anxiety    Takes xanax prn.  Follow.  Stable.  Daughter at college now.  Overall handling things relatively well.        Relevant Medications   ALPRAZolam (XANAX) 0.25 MG tablet   Axillary lump    Persistetn.  Saw Dr Tamala Julian.  Felt  to be cyst.  Due mammogram.  Will schedule diagnostic mammogram with ultrasound of left axilla to further evaluate.        Relevant Orders   MM Digital Diagnostic Bilat   US BREAST LTD UNI RIGHT INC AXILLA   GERD (gastroesophageal reflux disease)    Worsening acid reflux now.  On omeprazole.  Seeing GI.  Will have her take 40mg  omeprazole in am and zantac in the pm.  Continue f/u with GI.  Avoid antiinflammatories.        Hyperthyroidism - Primary    S/p ablation.  Follow tsh.       Relevant Orders   TSH (Completed)   Osteoarthritis of hip (bone-on-bone) (Left) (Chronic)    S/p left total hip arthroplasty.  Doing well.  Continue f/u with ortho.        Other Visit Diagnoses   None.      [name], MD

## 2015-11-16 ENCOUNTER — Encounter: Payer: Self-pay | Admitting: Internal Medicine

## 2015-11-17 ENCOUNTER — Encounter: Payer: Self-pay | Admitting: Internal Medicine

## 2015-11-17 NOTE — Assessment & Plan Note (Signed)
Worsening acid reflux now.  On omeprazole.  Seeing GI.  Will have her take 40mg  omeprazole in am and zantac in the pm.  Continue f/u with GI.  Avoid antiinflammatories.

## 2015-11-17 NOTE — Assessment & Plan Note (Signed)
S/p left total hip arthroplasty.  Doing well.  Continue f/u with ortho.   

## 2015-11-17 NOTE — Assessment & Plan Note (Signed)
Persistetn.  Saw Dr Tamala Julian.  Felt to be cyst.  Due mammogram.  Will schedule diagnostic mammogram with ultrasound of left axilla to further evaluate.

## 2015-11-17 NOTE — Assessment & Plan Note (Signed)
S/p ablation.  Follow tsh.

## 2015-11-17 NOTE — Assessment & Plan Note (Signed)
Takes xanax prn.  Follow.  Stable.  Daughter at college now.  Overall handling things relatively well.

## 2015-11-20 ENCOUNTER — Other Ambulatory Visit: Payer: Self-pay | Admitting: Internal Medicine

## 2015-11-20 DIAGNOSIS — R2231 Localized swelling, mass and lump, right upper limb: Secondary | ICD-10-CM

## 2015-11-20 NOTE — Progress Notes (Signed)
Order placed for left breast ultrasound as recommended.

## 2015-11-24 ENCOUNTER — Encounter: Payer: Self-pay | Admitting: Pain Medicine

## 2015-11-28 ENCOUNTER — Other Ambulatory Visit: Payer: Self-pay | Admitting: Internal Medicine

## 2015-11-28 ENCOUNTER — Ambulatory Visit
Admission: RE | Admit: 2015-11-28 | Discharge: 2015-11-28 | Disposition: A | Discharge status: Home / Self Care | Payer: 59 | Source: Ambulatory Visit | Attending: Internal Medicine | Admitting: Internal Medicine

## 2015-11-28 ENCOUNTER — Other Ambulatory Visit: Payer: 59

## 2015-11-28 DIAGNOSIS — R2231 Localized swelling, mass and lump, right upper limb: Secondary | ICD-10-CM | POA: Insufficient documentation

## 2015-11-29 ENCOUNTER — Telehealth: Payer: Self-pay | Admitting: Internal Medicine

## 2015-11-29 ENCOUNTER — Other Ambulatory Visit: Payer: Self-pay | Admitting: Internal Medicine

## 2015-11-29 DIAGNOSIS — R2231 Localized swelling, mass and lump, right upper limb: Secondary | ICD-10-CM

## 2015-11-29 NOTE — Telephone Encounter (Signed)
See result note - thanks

## 2015-11-29 NOTE — Telephone Encounter (Signed)
Pt called returning your call, regarding results. Thank you!  Call pt @ [phone]

## 2015-12-03 ENCOUNTER — Telehealth: Payer: Self-pay

## 2015-12-03 ENCOUNTER — Encounter: Payer: Self-pay | Admitting: Internal Medicine

## 2015-12-03 NOTE — Telephone Encounter (Signed)
Called pt and discussed.  She is scheduled for biopsy through radiology - scheduled for 12/05/15.

## 2015-12-03 NOTE — Telephone Encounter (Signed)
Spoke with patient who would like to have a procedure.  States she is having pain in her lower back going into her leg.  States she had an epidural before which helped with this pain and would like to have an epidural again.  PRN epidural has been ordered.  Patient does not want to have sedation for this procedure.  Will you please get approval and schedule this procedure.  Thank you

## 2015-12-04 NOTE — Telephone Encounter (Signed)
Request for authorization from [facility] for [place] 12/10/2015 submitted - case manager will contact me for clinical if needed. I will call back on Thursday 12/06/2015 for authorization status since appointment is pending for Monday 12/10/2015.

## 2015-12-04 NOTE — Telephone Encounter (Signed)
Patient notified

## 2015-12-05 ENCOUNTER — Other Ambulatory Visit: Payer: Self-pay | Admitting: Diagnostic Radiology

## 2015-12-05 ENCOUNTER — Ambulatory Visit
Admission: RE | Admit: 2015-12-05 | Discharge: 2015-12-05 | Disposition: A | Discharge status: Home / Self Care | Payer: 59 | Source: Ambulatory Visit | Attending: Internal Medicine | Admitting: Internal Medicine

## 2015-12-05 DIAGNOSIS — R2231 Localized swelling, mass and lump, right upper limb: Secondary | ICD-10-CM

## 2015-12-05 HISTORY — PX: BREAST BIOPSY: SHX20

## 2015-12-06 LAB — SURGICAL PATHOLOGY

## 2015-12-10 ENCOUNTER — Ambulatory Visit
Admission: RE | Admit: 2015-12-10 | Discharge: 2015-12-10 | Disposition: A | Discharge status: Home / Self Care | Payer: 59 | Source: Ambulatory Visit | Attending: Pain Medicine | Admitting: Pain Medicine

## 2015-12-10 ENCOUNTER — Ambulatory Visit (HOSPITAL_BASED_OUTPATIENT_CLINIC_OR_DEPARTMENT_OTHER): Payer: 59 | Admitting: Pain Medicine

## 2015-12-10 ENCOUNTER — Encounter: Payer: Self-pay | Admitting: Pain Medicine

## 2015-12-10 VITALS — BP 139/84 | HR 73 | Temp 98.1°F | Resp 15 | Ht 62.0 in | Wt 126.0 lb

## 2015-12-10 DIAGNOSIS — M5416 Radiculopathy, lumbar region: Secondary | ICD-10-CM | POA: Diagnosis not present

## 2015-12-10 DIAGNOSIS — M533 Sacrococcygeal disorders, not elsewhere classified: Secondary | ICD-10-CM

## 2015-12-10 DIAGNOSIS — M48061 Spinal stenosis, lumbar region without neurogenic claudication: Secondary | ICD-10-CM | POA: Diagnosis not present

## 2015-12-10 DIAGNOSIS — G894 Chronic pain syndrome: Secondary | ICD-10-CM | POA: Insufficient documentation

## 2015-12-10 DIAGNOSIS — M1612 Unilateral primary osteoarthritis, left hip: Secondary | ICD-10-CM | POA: Diagnosis not present

## 2015-12-10 DIAGNOSIS — M47896 Other spondylosis, lumbar region: Secondary | ICD-10-CM

## 2015-12-10 DIAGNOSIS — G8929 Other chronic pain: Secondary | ICD-10-CM

## 2015-12-10 DIAGNOSIS — M47816 Spondylosis without myelopathy or radiculopathy, lumbar region: Secondary | ICD-10-CM

## 2015-12-10 MED ORDER — SODIUM CHLORIDE 0.9% FLUSH
2.0000 mL | Freq: Once | INTRAVENOUS | Status: AC
  Administered 2015-12-10: 2 mL

## 2015-12-10 MED ORDER — IOPAMIDOL (ISOVUE-M 200) INJECTION 41%
10.0000 mL | Freq: Once | INTRAMUSCULAR | Status: AC
  Administered 2015-12-10: 10 mL via EPIDURAL
  Filled 2015-12-10: qty 10

## 2015-12-10 MED ORDER — LIDOCAINE HCL (PF) 1 % IJ SOLN
10.0000 mL | Freq: Once | INTRAMUSCULAR | Status: AC
  Administered 2015-12-10: 10 mL
  Filled 2015-12-10: qty 10

## 2015-12-10 MED ORDER — TRIAMCINOLONE ACETONIDE 40 MG/ML IJ SUSP
40.0000 mg | Freq: Once | INTRAMUSCULAR | Status: AC
  Administered 2015-12-10: 40 mg
  Filled 2015-12-10: qty 1

## 2015-12-10 MED ORDER — ROPIVACAINE HCL 2 MG/ML IJ SOLN
2.0000 mL | Freq: Once | INTRAMUSCULAR | Status: AC
  Administered 2015-12-10: 2 mL via EPIDURAL
  Filled 2015-12-10: qty 10

## 2015-12-10 NOTE — Patient Instructions (Addendum)
Pain Management Discharge Instructions  General Discharge Instructions :  If you need to reach your doctor call: Monday-Friday 8:00 am - 4:00 pm at [phone] or toll free [phone].  After clinic hours [phone] to have operator reach doctor.  Bring all of your medication bottles to all your appointments in the pain clinic.  To cancel or reschedule your appointment with Pain Management please remember to call 24 hours in advance to avoid a fee.  Refer to the educational materials which you have been given on: General Risks, I had my Procedure. Discharge Instructions, Post Sedation.  Post Procedure Instructions:  The drugs you were given will stay in your system until tomorrow, so for the next 24 hours you should not drive, make any legal decisions or drink any alcoholic beverages.  You may eat anything you prefer, but it is better to start with liquids then soups and crackers, and gradually work up to solid foods.  Please notify your doctor immediately if you have any unusual bleeding, trouble breathing or pain that is not related to your normal pain.  Depending on the type of procedure that was done, some parts of your body may feel week and/or numb.  This usually clears up by tonight or the next day.  Walk with the use of an assistive device or accompanied by an adult for the 24 hours.  You may use ice on the affected area for the first 24 hours.  Put ice in a Ziploc bag and cover with a towel and place against area 15 minutes on 15 minutes off.  You may switch to heat after 24 hours.Epidural Steroid Injection An epidural steroid injection is given to relieve pain in your neck, back, or legs that is caused by the irritation or swelling of a nerve root. This procedure involves injecting a steroid and numbing medicine (anesthetic) into the epidural space. The epidural space is the space between the outer covering of your spinal cord and the bones that form your backbone  (vertebra).  LET YOUR HEALTH CARE PROVIDER KNOW ABOUT:   Any allergies you have.  All medicines you are taking, including vitamins, herbs, eye drops, creams, and over-the-counter medicines such as aspirin.  Previous problems you or members of your family have had with the use of anesthetics.  Any blood disorders or blood clotting disorders you have.  Previous surgeries you have had.  Medical conditions you have. RISKS AND COMPLICATIONS Generally, this is a safe procedure. However, as with any procedure, complications can occur. Possible complications of epidural steroid injection include:  Headache.  Bleeding.  Infection.  Allergic reaction to the medicines.  Damage to your nerves. The response to this procedure depends on the underlying cause of the pain and its duration. People who have long-term (chronic) pain are less likely to benefit from epidural steroids than are those people whose pain comes on strong and suddenly. BEFORE THE PROCEDURE   Ask your health care provider about changing or stopping your regular medicines. You may be advised to stop taking blood-thinning medicines a few days before the procedure.  You may be given medicines to reduce anxiety.  Arrange for someone to take you home after the procedure. PROCEDURE   You will remain awake during the procedure. You may receive medicine to make you relaxed.  You will be asked to lie on your stomach.  The injection site will be cleaned.  The injection site will be numbed with a medicine (local anesthetic).  A needle will be   injected through your skin into the epidural space.  Your health care provider will use an X-ray machine to ensure that the steroid is delivered closest to the affected nerve. You may have minimal discomfort at this time.  Once the needle is in the right position, the local anesthetic and the steroid will be injected into the epidural space.  The needle will then be removed and a  bandage will be applied to the injection site. AFTER THE PROCEDURE   You may be monitored for a short time before you go home.  You may feel weakness or numbness in your arm or leg, which disappears within hours.  You may be allowed to eat, drink, and take your regular medicine.  You may have soreness at the site of the injection.   This information is not intended to replace advice given to you by your health care provider. Make sure you discuss any questions you have with your health care provider.   Document Released: 04/22/2007 Document Revised: 09/15/2012 Document Reviewed: 07/02/2012 [employer] Interactive Patient Education 2016 [employer] Pain Management Discharge Instructions  General Discharge Instructions :  If you need to reach your doctor call: Monday-Friday 8:00 am - 4:00 pm at [phone] or toll free [phone].  After clinic hours [phone] to have operator reach doctor.  Bring all of your medication bottles to all your appointments in the pain clinic.  To cancel or reschedule your appointment with Pain Management please remember to call 24 hours in advance to avoid a fee.  Refer to the educational materials which you have been given on: General Risks, I had my Procedure. Discharge Instructions, Post Sedation.  Post Procedure Instructions:  The drugs you were given will stay in your system until tomorrow, so for the next 24 hours you should not drive, make any legal decisions or drink any alcoholic beverages.  You may eat anything you prefer, but it is better to start with liquids then soups and crackers, and gradually work up to solid foods.  Please notify your doctor immediately if you have any unusual bleeding, trouble breathing or pain that is not related to your normal pain.  Depending on the type of procedure that was done, some parts of your body may feel week and/or numb.  This usually clears up by tonight or the next day.  Walk with the use of  an assistive device or accompanied by an adult for the 24 hours.  You may use ice on the affected area for the first 24 hours.  Put ice in a Ziploc bag and cover with a towel and place against area 15 minutes on 15 minutes off.  You may switch to heat after 24 hours.Epidural Steroid Injection Patient Information  Description: The epidural space surrounds the nerves as they exit the spinal cord.  In some patients, the nerves can be compressed and inflamed by a bulging disc or a tight spinal canal (spinal stenosis).  By injecting steroids into the epidural space, we can bring irritated nerves into direct contact with a potentially helpful medication.  These steroids act directly on the irritated nerves and can reduce swelling and inflammation which often leads to decreased pain.  Epidural steroids may be injected anywhere along the spine and from the neck to the low back depending upon the location of your pain.   After numbing the skin with local anesthetic (like Novocaine), a small needle is passed into the epidural space slowly.  You may experience a sensation of pressure   while this is being done.  The entire block usually last less than 10 minutes.  Conditions which may be treated by epidural steroids:   Low back and leg pain  Neck and arm pain  Spinal stenosis  Post-laminectomy syndrome  Herpes zoster (shingles) pain  Pain from compression fractures  Preparation for the injection:  1. Do not eat any solid food or dairy products within 8 hours of your appointment.  2. You may drink clear liquids up to 3 hours before appointment.  Clear liquids include water, black coffee, juice or soda.  No milk or cream please. 3. You may take your regular medication, including pain medications, with a sip of water before your appointment  Diabetics should hold regular insulin (if taken separately) and take 1/2 normal NPH dos the morning of the procedure.  Carry some sugar containing items with you to  your appointment. 4. A driver must accompany you and be prepared to drive you home after your procedure.  5. Bring all your current medications with your. 6. An IV may be inserted and sedation may be given at the discretion of the physician.   7. A blood pressure cuff, EKG and other monitors will often be applied during the procedure.  Some patients may need to have extra oxygen administered for a short period. 8. You will be asked to provide medical information, including your allergies, prior to the procedure.  We must know immediately if you are taking blood thinners (like Coumadin/Warfarin)  Or if you are allergic to IV iodine contrast (dye). We must know if you could possible be pregnant.  Possible side-effects:  Bleeding from needle site  Infection (rare, may require surgery)  Nerve injury (rare)  Numbness & tingling (temporary)  Difficulty urinating (rare, temporary)  Spinal headache ( a headache worse with upright posture)  Light -headedness (temporary)  Pain at injection site (several days)  Decreased blood pressure (temporary)  Weakness in arm/leg (temporary)  Pressure sensation in back/neck (temporary)  Call if you experience:  Fever/chills associated with headache or increased back/neck pain.  Headache worsened by an upright position.  New onset weakness or numbness of an extremity below the injection site  Hives or difficulty breathing (go to the emergency room)  Inflammation or drainage at the infection site  Severe back/neck pain  Any new symptoms which are concerning to you  Please note:  Although the local anesthetic injected can often make your back or neck feel good for several hours after the injection, the pain will likely return.  It takes 3-7 days for steroids to work in the epidural space.  You may not notice any pain relief for at least that one week.  If effective, we will often do a series of three injections spaced 3-6 weeks apart to  maximally decrease your pain.  After the initial series, we generally will wait several months before considering a repeat injection of the same type.  If you have any questions, please call [phone] [facility] 

## 2015-12-10 NOTE — Progress Notes (Signed)
Patient's Name: Shannon [name]  MRN: [id number]  Referring Provider: [name], MD  DOB: 1965/02/20  PCP: [name], MD  DOS: 12/10/2015  Note by: Kathlen Brunswick. Dossie Arbour, MD  Service setting: Ambulatory outpatient  Location: [facility] (AMB) Pain Management Facility  Visit type: Procedure  Specialty: Interventional Pain Management  Patient type: Established   Primary Reason for Visit: Interventional Pain Management Treatment. CC: Back Pain (bilateral, but right is worse)  Procedure:  Anesthesia, Analgesia, Anxiolysis:  Type: Therapeutic Inter-Laminar Epidural Steroid Injection Region: Lumbar Level: L4-5 Level. Laterality: Right-Sided         Type: Local Anesthesia Local Anesthetic: Lidocaine 1% Route: Infiltration ([place]/IM) IV Access: Declined Sedation: Declined  Indication(s): Analgesia          Indications: 1. Chronic lumbar radicular pain (Location of Tertiary source of pain) (intermittent) (L4 Dermatome) (Left)   2. Lumbar lateral recess stenosis (L4-5) (Left)   3. Lumbar facet hypertrophy (L4-5) (Left)   4. Primary osteoarthritis of left hip   5. Chronic sacroiliac joint pain (Location of Secondary source of pain) (intermittent) (Left)    Pain Score: Pre-procedure: 2 /10 Post-procedure: 2 /10  Pre-Procedure Assessment:  Shannon [name] is a 50 y.o. (year old), female patient, seen today for interventional treatment. She  has a past surgical history that includes Septoplasty (5/90); Nasal sinus surgery (11/93); Cesarean section (11/98); Tubal ligation (02/1998); Umbilical hernia repair (02/1998); Cholecystectomy (11/05); and Joint replacement (Left, 2017).. Her primarily concern today is the Back Pain (bilateral, but right is worse) The primary encounter diagnosis was Chronic lumbar radicular pain (Location of Tertiary source of pain) (intermittent) (L4 Dermatome) (Left). Diagnoses of Lumbar lateral recess stenosis (L4-5) (Left), Lumbar facet hypertrophy (L4-5) (Left), Primary  osteoarthritis of left hip, and Chronic sacroiliac joint pain (Location of Secondary source of pain) (intermittent) (Left) were also pertinent to this visit.  Pain Type: Chronic pain Pain Location: Back Pain Orientation: Lower Pain Descriptors / Indicators: Aching Pain Frequency: Intermittent  Date of Last Visit: 08/22/15 Service Provided on Last Visit: Evaluation  Coagulation Parameters Lab Results  Component Value Date   PLT 248.0 03/22/2015   Verification of the correct person, correct site (including marking of site), and correct procedure were performed and confirmed by the patient.  Consent: Before the procedure and under the influence of no sedative(s), amnesic(s), or anxiolytics, the patient was informed of the treatment options, risks and possible complications. To fulfill our ethical and legal obligations, as recommended by the American Medical Association's Code of Ethics, I have informed the patient of my clinical impression; the nature and purpose of the treatment or procedure; the risks, benefits, and possible complications of the intervention; the alternatives, including doing nothing; the risk(s) and benefit(s) of the alternative treatment(s) or procedure(s); and the risk(s) and benefit(s) of doing nothing. The patient was provided information about the general risks and possible complications associated with the procedure. These may include, but are not limited to: failure to achieve desired goals, infection, bleeding, organ or nerve damage, allergic reactions, paralysis, and death. In addition, the patient was informed of those risks and complications associated to Spine-related procedures, such as failure to decrease pain; infection (i.e.: Meningitis, epidural or intraspinal abscess); bleeding (i.e.: epidural hematoma, subarachnoid hemorrhage, or any other type of intraspinal or peri-dural bleeding); organ or nerve damage (i.e.: Any type of peripheral nerve, nerve root, or  spinal cord injury) with subsequent damage to sensory, motor, and/or autonomic systems, resulting in permanent pain, numbness, and/or weakness of one or several  areas of the body; allergic reactions; (i.e.: anaphylactic reaction); and/or death. Furthermore, the patient was informed of those risks and complications associated with the medications. These include, but are not limited to: allergic reactions (i.e.: anaphylactic or anaphylactoid reaction(s)); adrenal axis suppression; blood sugar elevation that in diabetics may result in ketoacidosis or comma; water retention that in patients with history of congestive heart failure may result in shortness of breath, pulmonary edema, and decompensation with resultant heart failure; weight gain; swelling or edema; medication-induced neural toxicity; particulate matter embolism and blood vessel occlusion with resultant organ, and/or nervous system infarction; and/or aseptic necrosis of one or more joints. Finally, the patient was informed that Medicine is not an exact science; therefore, there is also the possibility of unforeseen or unpredictable risks and/or possible complications that may result in a catastrophic outcome. The patient indicated having understood very clearly. We have given the patient no guarantees and we have made no promises. Enough time was given to the patient to ask questions, all of which were answered to the patient's satisfaction. Shannon [name] has indicated that she wanted to continue with the procedure.  Consent Attestation: I, the ordering provider, attest that I have discussed with the patient the benefits, risks, side-effects, alternatives, likelihood of achieving goals, and potential problems during recovery for the procedure that I have provided informed consent.  Pre-Procedure Preparation:  Safety Precautions: Allergies reviewed. The patient was asked about blood thinners, or active infections, both of which were denied. The patient  was asked to confirm the procedure and laterality, before marking the site, and again before commencing the procedure. Appropriate site, procedure, and patient were confirmed by following the Joint Commission's Universal Protocol (UP.01.01.01), in the form of a "Time Out". The patient was asked to participate by confirming the accuracy of the "Time Out" information. Patient was assessed for positional comfort and pressure points before starting the procedure. Allergies: She is allergic to codeine; decongest-aid [pseudoephedrine]; flagyl [metronidazole]; and sulfa antibiotics. Allergy Precautions: None required Infection Control Precautions: Sterile technique used. Standard Universal Precautions were taken as recommended by the Department of [facility] for Disease Control and Prevention (CDC). Standard pre-surgical skin prep was conducted. Respiratory hygiene and cough etiquette was practiced. Hand hygiene observed. Safe injection practices and needle disposal techniques followed. SDV (single dose vial) medications used. Medications properly checked for expiration dates and contaminants. Personal protective equipment (PPE) used as per protocol. Monitoring:  As per clinic protocol. Vitals:   12/10/15 0956 12/10/15 1000 12/10/15 1005 12/10/15 1006  BP: 118/82 (!) 139/123 136/81 139/84  Pulse:      Resp: 16 18 15 15   Temp:      SpO2: 100% 100% 100% 100%  Weight:      Height:      Calculated BMI: Body mass index is 23.05 kg/m. Time-out: "Time-out" completed before starting procedure, as per protocol.  Description of Procedure Process:   Position: Prone with head of the table was raised to facilitate breathing. Target Area: The interlaminar space, initially targeting the lower laminar border of the superior vertebral body. Approach: Paramedial approach. Area Prepped: Entire Posterior Lumbar Region Prepping solution: ChloraPrep (2% chlorhexidine gluconate and 70% isopropyl alcohol) Safety  Precautions: Aspiration looking for blood return was conducted prior to all injections. At no point did we inject any substances, as a needle was being advanced. No attempts were made at seeking any paresthesias. Safe injection practices and needle disposal techniques used. Medications properly checked for expiration dates. SDV (single dose vial)  medications used. Description of the Procedure: Protocol guidelines were followed. The procedure needle was introduced through the skin, ipsilateral to the reported pain, and advanced to the target area. Bone was contacted and the needle walked caudad, until the lamina was cleared. The epidural space was identified using "loss-of-resistance technique" with 2-3 ml of PF-NaCl (0.9% NSS), in a 5cc LOR glass syringe. EBL: None Materials & Medications:  Needle(s) Used: 20g - 10cm, Tuohy-style epidural needle Medication(s): see below.  Imaging Guidance (Spinal):  Type of Imaging Technique: Fluoroscopy Guidance (Spinal) Indication(s): Assistance in needle guidance and placement for procedures requiring needle placement in or near specific anatomical locations not easily accessible without such assistance. Exposure Time: Please see nurses notes. Contrast: Before injecting any contrast, we confirmed that the patient did not have an allergy to iodine, shellfish, or radiological contrast. Once satisfactory needle placement was completed at the desired level, radiological contrast was injected. Contrast injected under live fluoroscopy. No contrast complications. See chart for type and volume of contrast used. Fluoroscopic Guidance: I was personally present during the use of fluoroscopy. "Tunnel Vision Technique" used to obtain the best possible view of the target area. Parallax error corrected before commencing the procedure. "Direction-depth-direction" technique used to introduce the needle under continuous pulsed fluoroscopy. Once target was reached, antero-posterior,  oblique, and lateral fluoroscopic projection used confirm needle placement in all planes. Images permanently stored in EMR. Interpretation: I personally interpreted the imaging intraoperatively. Adequate needle placement confirmed in multiple planes. Appropriate spread of contrast into desired area was observed. No evidence of afferent or efferent intravascular uptake. No intrathecal or subarachnoid spread observed. Permanent images saved into the patient's record.  Antibiotic Prophylaxis:  Indication(s): No indications identified. Type:  Antibiotics Given (last 72 hours)    None      Post-operative Assessment:  Complications: No immediate post-treatment complications observed by team, or reported by patient. Disposition: The patient tolerated the entire procedure well. A repeat set of vitals were taken after the procedure and the patient was kept under observation following institutional policy, for this type of procedure. Post-procedural neurological assessment was performed, showing return to baseline, prior to discharge. The patient was provided with post-procedure discharge instructions, including a section on how to identify potential problems. Should any problems arise concerning this procedure, the patient was given instructions to immediately contact us, at any time, without hesitation. In any case, we plan to contact the patient by telephone for a follow-up status report regarding this interventional procedure. Comments:  No additional relevant information.  Plan of Care  Discharge to: Discharge home  Medications ordered for procedure: Meds ordered this encounter  Medications  . iopamidol (ISOVUE-M) 41 % intrathecal injection 10 mL  . triamcinolone acetonide (KENALOG-40) injection 40 mg  . lidocaine (PF) (XYLOCAINE) 1 % injection 10 mL  . sodium chloride flush (NS) 0.9 % injection 2 mL  . ropivacaine (PF) 2 mg/ml (0.2%) (NAROPIN) epidural 2 mL   Medications administered: (For  more details, see medical record) We administered iopamidol, triamcinolone acetonide, lidocaine (PF), sodium chloride flush, and ropivacaine (PF) 2 mg/ml (0.2%). Lab-work, Procedure(s), & Referral(s) Ordered: Orders Placed This Encounter  Procedures  . DG C-Arm 1-60 Min-No Report   Imaging Ordered: No results found for this or any previous visit. New Prescriptions   No medications on file   Physician-requested Follow-up:  Return in about 2 weeks (around 12/24/2015) for Post-Procedure evaluation.  Future Appointments Date Time Provider [place]  01/02/2016 1:45 PM Milinda Pointer, MD [facility] None  02/25/2016 2:30 PM [name], MD [facility] None   Primary Care Physician: [name], MD Location: [facility] Note by: Kathlen Brunswick. Dossie Arbour, M.D, DABA, DABAPM, DABPM, DABIPP, FIPP  Disclaimer:  Medicine is not an exact science. The only guarantee in medicine is that nothing is guaranteed. It is important to note that the decision to proceed with this intervention was based on the information collected from the patient. The Data and conclusions were drawn from the patient's questionnaire, the interview, and the physical examination. Because the information was provided in large part by the patient, it cannot be guaranteed that it has not been purposely or unconsciously manipulated. Every effort has been made to obtain as much relevant data as possible for this evaluation. It is important to note that the conclusions that lead to this procedure are derived in large part from the available data. Always take into account that the treatment will also be dependent on availability of resources and existing treatment guidelines, considered by other Pain Management Practitioners as being common knowledge and practice, at the time of the intervention. For Medico-Legal purposes, it is also important to point out that variation in procedural techniques and  pharmacological choices are the acceptable norm. The indications, contraindications, technique, and results of the above procedure should only be interpreted and judged by a Board-Certified Interventional Pain Specialist with extensive familiarity and expertise in the same exact procedure and technique. Attempts at providing opinions without similar or greater experience and expertise than that of the treating physician will be considered as inappropriate and unethical, and shall result in a formal complaint to the state medical board and applicable specialty societies.  Instructions provided at this appointment: Patient Instructions  Pain Management Discharge Instructions  General Discharge Instructions :  If you need to reach your doctor call: Monday-Friday 8:00 am - 4:00 pm at [phone] or toll free [phone].  After clinic hours [phone] to have operator reach doctor.  Bring all of your medication bottles to all your appointments in the pain clinic.  To cancel or reschedule your appointment with Pain Management please remember to call 24 hours in advance to avoid a fee.  Refer to the educational materials which you have been given on: General Risks, I had my Procedure. Discharge Instructions, Post Sedation.  Post Procedure Instructions:  The drugs you were given will stay in your system until tomorrow, so for the next 24 hours you should not drive, make any legal decisions or drink any alcoholic beverages.  You may eat anything you prefer, but it is better to start with liquids then soups and crackers, and gradually work up to solid foods.  Please notify your doctor immediately if you have any unusual bleeding, trouble breathing or pain that is not related to your normal pain.  Depending on the type of procedure that was done, some parts of your body may feel week and/or numb.  This usually clears up by tonight or the next day.  Walk with the use of an assistive device or  accompanied by an adult for the 24 hours.  You may use ice on the affected area for the first 24 hours.  Put ice in a Ziploc bag and cover with a towel and place against area 15 minutes on 15 minutes off.  You may switch to heat after 24 hours.Epidural Steroid Injection An epidural steroid injection is given to relieve pain in your neck, back, or legs that is caused by the irritation or swelling  of a nerve root. This procedure involves injecting a steroid and numbing medicine (anesthetic) into the epidural space. The epidural space is the space between the outer covering of your spinal cord and the bones that form your backbone (vertebra).  LET [facility] CARE PROVIDER KNOW ABOUT:   Any allergies you have.  All medicines you are taking, including vitamins, herbs, eye drops, creams, and over-the-counter medicines such as aspirin.  Previous problems you or members of your family have had with the use of anesthetics.  Any blood disorders or blood clotting disorders you have.  Previous surgeries you have had.  Medical conditions you have. RISKS AND COMPLICATIONS Generally, this is a safe procedure. However, as with any procedure, complications can occur. Possible complications of epidural steroid injection include:  Headache.  Bleeding.  Infection.  Allergic reaction to the medicines.  Damage to your nerves. The response to this procedure depends on the underlying cause of the pain and its duration. People who have long-term (chronic) pain are less likely to benefit from epidural steroids than are those people whose pain comes on strong and suddenly. BEFORE THE PROCEDURE   Ask your health care provider about changing or stopping your regular medicines. You may be advised to stop taking blood-thinning medicines a few days before the procedure.  You may be given medicines to reduce anxiety.  Arrange for someone to take you home after the procedure. PROCEDURE   You will remain awake  during the procedure. You may receive medicine to make you relaxed.  You will be asked to lie on your stomach.  The injection site will be cleaned.  The injection site will be numbed with a medicine (local anesthetic).  A needle will be injected through your skin into the epidural space.  Your health care provider will use an X-ray machine to ensure that the steroid is delivered closest to the affected nerve. You may have minimal discomfort at this time.  Once the needle is in the right position, the local anesthetic and the steroid will be injected into the epidural space.  The needle will then be removed and a bandage will be applied to the injection site. AFTER THE PROCEDURE   You may be monitored for a short time before you go home.  You may feel weakness or numbness in your arm or leg, which disappears within hours.  You may be allowed to eat, drink, and take your regular medicine.  You may have soreness at the site of the injection.   This information is not intended to replace advice given to you by your health care provider. Make sure you discuss any questions you have with your health care provider.   Document Released: 04/22/2007 Document Revised: 09/15/2012 Document Reviewed: 07/02/2012 Elsevier Interactive Patient Education 2016 [place]. Pain Management Discharge Instructions  General Discharge Instructions :  If you need to reach your doctor call: Monday-Friday 8:00 am - 4:00 pm at [phone] or toll free [phone].  After clinic hours [phone] to have operator reach doctor.  Bring all of your medication bottles to all your appointments in the pain clinic.  To cancel or reschedule your appointment with Pain Management please remember to call 24 hours in advance to avoid a fee.  Refer to the educational materials which you have been given on: General Risks, I had my Procedure. Discharge Instructions, Post Sedation.  Post Procedure  Instructions:  The drugs you were given will stay in your system until tomorrow, so for the next  24 hours you should not drive, make any legal decisions or drink any alcoholic beverages.  You may eat anything you prefer, but it is better to start with liquids then soups and crackers, and gradually work up to solid foods.  Please notify your doctor immediately if you have any unusual bleeding, trouble breathing or pain that is not related to your normal pain.  Depending on the type of procedure that was done, some parts of your body may feel week and/or numb.  This usually clears up by tonight or the next day.  Walk with the use of an assistive device or accompanied by an adult for the 24 hours.  You may use ice on the affected area for the first 24 hours.  Put ice in a Ziploc bag and cover with a towel and place against area 15 minutes on 15 minutes off.  You may switch to heat after 24 hours.Epidural Steroid Injection Patient Information  Description: The epidural space surrounds the nerves as they exit the spinal cord.  In some patients, the nerves can be compressed and inflamed by a bulging disc or a tight spinal canal (spinal stenosis).  By injecting steroids into the epidural space, we can bring irritated nerves into direct contact with a potentially helpful medication.  These steroids act directly on the irritated nerves and can reduce swelling and inflammation which often leads to decreased pain.  Epidural steroids may be injected anywhere along the spine and from the neck to the low back depending upon the location of your pain.   After numbing the skin with local anesthetic (like Novocaine), a small needle is passed into the epidural space slowly.  You may experience a sensation of pressure while this is being done.  The entire block usually last less than 10 minutes.  Conditions which may be treated by epidural steroids:   Low back and leg pain  Neck and arm pain  Spinal  stenosis  Post-laminectomy syndrome  Herpes zoster (shingles) pain  Pain from compression fractures  Preparation for the injection:  1. Do not eat any solid food or dairy products within 8 hours of your appointment.  2. You may drink clear liquids up to 3 hours before appointment.  Clear liquids include water, black coffee, juice or soda.  No milk or cream please. 3. You may take your regular medication, including pain medications, with a sip of water before your appointment  Diabetics should hold regular insulin (if taken separately) and take 1/2 normal NPH dos the morning of the procedure.  Carry some sugar containing items with you to your appointment. 4. A driver must accompany you and be prepared to drive you home after your procedure.  5. Bring all your current medications with your. 6. An IV may be inserted and sedation may be given at the discretion of the physician.   7. A blood pressure cuff, EKG and other monitors will often be applied during the procedure.  Some patients may need to have extra oxygen administered for a short period. 8. You will be asked to provide medical information, including your allergies, prior to the procedure.  We must know immediately if you are taking blood thinners (like Coumadin/Warfarin)  Or if you are allergic to IV iodine contrast (dye). We must know if you could possible be pregnant.  Possible side-effects:  Bleeding from needle site  Infection (rare, may require surgery)  Nerve injury (rare)  Numbness & tingling (temporary)  Difficulty urinating (rare, temporary)  Spinal headache ( a headache worse with upright posture)  Light -headedness (temporary)  Pain at injection site (several days)  Decreased blood pressure (temporary)  Weakness in arm/leg (temporary)  Pressure sensation in back/neck (temporary)  Call if you experience:  Fever/chills associated with headache or increased back/neck pain.  Headache worsened by an upright  position.  New onset weakness or numbness of an extremity below the injection site  Hives or difficulty breathing (go to the emergency room)  Inflammation or drainage at the infection site  Severe back/neck pain  Any new symptoms which are concerning to you  Please note:  Although the local anesthetic injected can often make your back or neck feel good for several hours after the injection, the pain will likely return.  It takes 3-7 days for steroids to work in the epidural space.  You may not notice any pain relief for at least that one week.  If effective, we will often do a series of three injections spaced 3-6 weeks apart to maximally decrease your pain.  After the initial series, we generally will wait several months before considering a repeat injection of the same type.  If you have any questions, please call [phone] [facility]

## 2015-12-10 NOTE — Progress Notes (Signed)
Safety precautions to be maintained throughout the outpatient stay will include: orient to surroundings, keep bed in low position, maintain call bell within reach at all times, provide assistance with transfer out of bed and ambulation.  

## 2015-12-11 ENCOUNTER — Telehealth: Payer: Self-pay | Admitting: *Deleted

## 2015-12-11 NOTE — Telephone Encounter (Signed)
Message left

## 2016-01-02 ENCOUNTER — Encounter: Payer: Self-pay | Admitting: Pain Medicine

## 2016-01-02 ENCOUNTER — Ambulatory Visit: Payer: 59 | Attending: Pain Medicine | Admitting: Pain Medicine

## 2016-01-02 VITALS — BP 124/80 | HR 68 | Temp 98.3°F | Resp 16 | Ht 62.0 in | Wt 125.0 lb

## 2016-01-02 DIAGNOSIS — Z96642 Presence of left artificial hip joint: Secondary | ICD-10-CM | POA: Diagnosis not present

## 2016-01-02 DIAGNOSIS — G894 Chronic pain syndrome: Secondary | ICD-10-CM | POA: Diagnosis present

## 2016-01-02 DIAGNOSIS — E0590 Thyrotoxicosis, unspecified without thyrotoxic crisis or storm: Secondary | ICD-10-CM | POA: Insufficient documentation

## 2016-01-02 DIAGNOSIS — D649 Anemia, unspecified: Secondary | ICD-10-CM | POA: Diagnosis not present

## 2016-01-02 DIAGNOSIS — F17200 Nicotine dependence, unspecified, uncomplicated: Secondary | ICD-10-CM | POA: Diagnosis not present

## 2016-01-02 DIAGNOSIS — F419 Anxiety disorder, unspecified: Secondary | ICD-10-CM | POA: Diagnosis not present

## 2016-01-02 DIAGNOSIS — M48061 Spinal stenosis, lumbar region without neurogenic claudication: Secondary | ICD-10-CM | POA: Insufficient documentation

## 2016-01-02 DIAGNOSIS — K219 Gastro-esophageal reflux disease without esophagitis: Secondary | ICD-10-CM | POA: Insufficient documentation

## 2016-01-02 DIAGNOSIS — N950 Postmenopausal bleeding: Secondary | ICD-10-CM | POA: Diagnosis not present

## 2016-01-02 DIAGNOSIS — M533 Sacrococcygeal disorders, not elsewhere classified: Secondary | ICD-10-CM | POA: Insufficient documentation

## 2016-01-02 NOTE — Progress Notes (Signed)
Safety precautions to be maintained throughout the outpatient stay will include: orient to surroundings, keep bed in low position, maintain call bell within reach at all times, provide assistance with transfer out of bed and ambulation.  

## 2016-01-02 NOTE — Patient Instructions (Signed)
Sacroiliac (SI) Joint Injection Patient Information  Description: The sacroiliac joint connects the scrum (very low back and tailbone) to the ilium (a pelvic bone which also forms half of the hip joint).  Normally this joint experiences very little motion.  When this joint becomes inflamed or unstable low back and or hip and pelvis pain may result.  Injection of this joint with local anesthetics (numbing medicines) and steroids can provide diagnostic information and reduce pain.  This injection is performed with the aid of x-ray guidance into the tailbone area while you are lying on your stomach.   You may experience an electrical sensation down the leg while this is being done.  You may also experience numbness.  We also may ask if we are reproducing your normal pain during the injection.  Conditions which may be treated SI injection:   Low back, buttock, hip or leg pain  Preparation for the Injection:  1. Do not eat any solid food or dairy products within 8 hours of your appointment.  2. You may drink clear liquids up to 3 hours before appointment.  Clear liquids include water, black coffee, juice or soda.  No milk or cream please. 3. You may take your regular medications, including pain medications with a sip of water before your appointment.  Diabetics should hold regular insulin (if take separately) and take 1/2 normal NPH dose the morning of the procedure.  Carry some sugar containing items with you to your appointment. 4. A driver must accompany you and be prepared to drive you home after your procedure. 5. Bring all of your current medications with you. 6. An IV may be inserted and sedation may be given at the discretion of the physician. 7. A blood pressure cuff, EKG and other monitors will often be applied during the procedure.  Some patients may need to have extra oxygen administered for a short period.  8. You will be asked to provide medical information, including your allergies,  prior to the procedure.  We must know immediately if you are taking blood thinners (like Coumadin/Warfarin) or if you are allergic to IV iodine contrast (dye).  We must know if you could possible be pregnant.  Possible side effects:   Bleeding from needle site  Infection (rare, may require surgery)  Nerve injury (rare)  Numbness & tingling (temporary)  A brief convulsion or seizure  Light-headedness (temporary)  Pain at injection site (several days)  Decreased blood pressure (temporary)  Weakness in the leg (temporary)   Call if you experience:   New onset weakness or numbness of an extremity below the injection site that last more than 8 hours.  Hives or difficulty breathing ( go to the emergency room)  Inflammation or drainage at the injection site  Any new symptoms which are concerning to you  Please note:  Although the local anesthetic injected can often make your back/ hip/ buttock/ leg feel good for several hours after the injections, the pain will likely return.  It takes 3-7 days for steroids to work in the sacroiliac area.  You may not notice any pain relief for at least that one week.  If effective, we will often do a series of three injections spaced 3-6 weeks apart to maximally decrease your pain.  After the initial series, we generally will wait some months before a repeat injection of the same type.  If you have any questions, please call [phone] [facility]   

## 2016-01-02 NOTE — Progress Notes (Signed)
Patient's Name: Shannon [name]  MRN: [id number]  Referring Provider: [name], MD  DOB: August 16, 1965  PCP: [name], MD  DOS: 01/02/2016  Note by: Kathlen Brunswick. Dossie Arbour, MD  Service setting: Ambulatory outpatient  Specialty: Interventional Pain Management  Location: [facility] (AMB) Pain Management Facility    Patient type: Established   Primary Reason(s) for Visit: Encounter for post-procedure evaluation of chronic illness with mild to moderate exacerbation CC: Back Pain (right buttock)  HPI  Shannon [name] is a 50 y.o. year old, female patient, who comes today for a post-procedure evaluation. She has Hyperthyroidism; GERD (gastroesophageal reflux disease); Anemia; Migraine; Menopausal symptoms; Health care maintenance; Anxiety; Atypical chest pain; Abdominal pain, epigastric; Gastro-esophageal reflux disease without esophagitis; Chronic pain; Chronic low back pain (Left); Lumbar lateral recess stenosis (L4-5) (Left); Lumbar facet hypertrophy (L4-5) (Left); Chronic lumbar radicular pain (Location of Tertiary source of pain) (intermittent) (L4 Dermatome) (Left); Chronic hip pain (Location of Primary Source of Pain) (intermittent) (Left); Chronic sacroiliac joint pain (Location of Secondary source of pain) (intermittent) (Left); Nicotine dependence; Tobacco abuse; Osteoarthritis of hip (bone-on-bone) (Left); Lumbar spondylosis; Encounter for chronic pain management; Post-menopausal bleeding; Axillary lump; Muscle ache; Hemorrhage, postmenopausal; Osteoarthritis of sacroiliac joint (Left); Eructation; History of left hip replacement; Primary osteoarthritis of left hip; and Pain of right sacroiliac joint on her problem list. Her primarily concern today is the Back Pain (right buttock)  Pain Assessment: Self-Reported Pain Score: 4 /10             Reported level is compatible with observation.       Pain Type: Chronic pain Pain Location: Buttocks (spot in right buttock) Pain Orientation: Right Pain  Descriptors / Indicators: Aching Pain Frequency: Constant  Shannon [name] comes in today for post-procedure evaluation after the treatment done on 12/10/2015. Her hip pain and left-sided pain are both in the control. She now has no pain on the right lower back. Physical exam today would suggest this to be secondary to sacroiliac joint dysfunction. The patient has requested to come back for an SI joint injection.  Further details on both, my assessment(s), as well as the proposed treatment plan, please see below.  Post-Procedure Assessment  12/10/2015 Procedure: Palliative right-sided L4-5 lumbar epidural steroid injection under fluoroscopic guidance, no sedation. Post-procedure pain score: 2/10         Influential Factors: BMI: 22.86 kg/m Intra-procedural challenges: None observed Assessment challenges: None detected         Post-procedural side-effects, adverse reactions, or complications: None reported Reported issues: None  Sedation: No sedation used. When no sedatives are used, the analgesic levels obtained are directly associated to the effectiveness of the local anesthetics. However, when sedation is provided, the level of analgesia obtained during the initial 1 hour following the intervention, is believed to be the result of a combination of factors. These factors may include, but are not limited to: 1. The effectiveness of the local anesthetics used. 2. The effects of the analgesic(s) and/or anxiolytic(s) used. 3. The degree of discomfort experienced by the patient at the time of the procedure. 4. The patients ability and reliability in recalling and recording the events. 5. The presence and influence of possible secondary gains and/or psychosocial factors. Reported result: Relief experienced during the 1st hour after the procedure: 100 % (Ultra-Short Term Relief) Interpretative annotation: No Analgesic or Anxiolytic given, therefore benefits are completely due to Local Anesthetics.           Effects of local anesthetic: The analgesic effects  attained during this period are directly associated to the localized infiltration of local anesthetics and therefore cary significant diagnostic value as to the etiological location, or anatomical origin, of the pain. Expected duration of relief is directly dependent on the pharmacodynamics of the local anesthetic used. Long-acting (4-6 hours) anesthetics used.  Reported result: Relief during the next 4 to 6 hour after the procedure: 100 % (Short-Term Relief) Interpretative annotation: Complete relief would suggest area to be the source of the pain.          Long-term benefit: Defined as the period of time past the expected duration of local anesthetics. With the possible exception of prolonged sympathetic blockade from the local anesthetics, benefits during this period are typically attributed to, or associated with, other factors such as analgesic sensory neuropraxia, antiinflammatory effects, or beneficial biochemical changes provided by agents other than the local anesthetics Reported result: Extended relief following procedure: 100 % (Long-Term Relief) Interpretative annotation: Good relief. This could suggest inflammation to be a significant component in the etiology to the pain.          Current benefits: Defined as persistent relief that continues at this point in time.   Reported results: Treated area: 100 % In addition, the patient reports improvement in function in the injected area. However, she is not having some pain on the opposite side. Interpretative annotation: Ongoing benefits would suggest effective palliative intervention  Interpretation: Results would suggest a successful palliative intervention.          Laboratory Chemistry  Inflammation Markers Lab Results  Component Value Date   ESRSEDRATE 8 05/22/2015   Renal Function Lab Results  Component Value Date   BUN 11 03/22/2015   CREATININE 0.74 03/22/2015   GFRAA  >60 05/02/2014   GFRNONAA >60 05/02/2014   Hepatic Function Lab Results  Component Value Date   AST 17 03/22/2015   ALT 15 03/22/2015   ALBUMIN 4.4 03/22/2015   Electrolytes Lab Results  Component Value Date   NA 140 03/22/2015   K 3.9 03/22/2015   CL 105 03/22/2015   CALCIUM 9.9 03/22/2015   Pain Modulating Vitamins No results found for: Maralyn Sago [id number], [id number], [id number], 25OHVITD1, 25OHVITD2, 25OHVITD3, VITAMINB12 Coagulation Parameters Lab Results  Component Value Date   PLT 248.0 03/22/2015   Cardiovascular Lab Results  Component Value Date   HGB 13.8 03/22/2015   HCT 40.6 03/22/2015   Note: Lab results reviewed.  Recent Diagnostic Imaging Review  Dg C-arm 1-60 Min-no Report  Result Date: 12/27/2015 Fluoroscopy was utilized by the requesting physician.  No radiographic interpretation.   Note: Imaging results reviewed.          Meds  The patient has a current medication list which includes the following prescription(s): acetaminophen, alprazolam, omeprazole, ranitidine, amoxicillin, amoxicillin, gabapentin, meloxicam, oxycodone, and polyethylene glycol powder.  Current Outpatient Prescriptions on File Prior to Visit  Medication Sig  . acetaminophen (TYLENOL) 500 MG tablet Take 1,000 mg by mouth every 6 (six) hours as needed for moderate pain.  Marland Kitchen ALPRAZolam (XANAX) 0.25 MG tablet TAKE ONE TABLET BY MOUTH AT BEDTIME AS NEEDED FOR ANXIETY  . omeprazole (PRILOSEC) 40 MG capsule Take 40 mg by mouth daily.  . ranitidine (ZANTAC) 150 MG tablet Take 150 mg by mouth at bedtime.  . gabapentin (NEURONTIN) 300 MG capsule   . meloxicam (MOBIC) 7.5 MG tablet   . oxyCODONE (OXY IR/ROXICODONE) 5 MG immediate release tablet   . polyethylene glycol powder (GLYCOLAX/MIRALAX) powder    No  current facility-administered medications on file prior to visit.    ROS  Constitutional: Denies any fever or chills Gastrointestinal: No reported hemesis, hematochezia, vomiting,  or acute GI distress Musculoskeletal: Denies any acute onset joint swelling, redness, loss of ROM, or weakness Neurological: No reported episodes of acute onset apraxia, aphasia, dysarthria, agnosia, amnesia, paralysis, loss of coordination, or loss of consciousness  Allergies  Shannon [name] is allergic to codeine; decongest-aid [pseudoephedrine]; flagyl [metronidazole]; and sulfa antibiotics.  [place]  Drug: Shannon [name]  reports that she does not use drugs. Alcohol:  reports that she does not drink alcohol. Tobacco:  reports that she has quit smoking. She has never used smokeless tobacco. Medical:  has a past medical history of Anemia; Breast pain, right (11/07/2012); Breathing difficulty (06/20/2014); Environmental allergies; GERD (gastroesophageal reflux disease); Hiatal hernia; History of migraine headaches; Hyperthyroidism; and Vaginitis (01/16/2013). Family: family history includes Breast cancer (age of onset: 77) in her mother; Heart disease in her father and maternal grandfather; Hypercholesterolemia in her father and mother; Hypertension in her father; Kidney cancer in her father; Leukemia in her father; Lung cancer in her paternal grandfather; Pancreatic cancer in her paternal grandfather; Prostate cancer in her paternal grandfather; Thyroid disease in her father and mother.  Past Surgical History:  Procedure Laterality Date  . CESAREAN SECTION  11/98   Dr Roena Malady  . CHOLECYSTECTOMY  11/05   Dr Pat Patrick  . JOINT REPLACEMENT Left 2017  . NASAL SINUS SURGERY  11/93  . SEPTOPLASTY  5/90   Dr Rossie Muskrat  . TUBAL LIGATION  02/1998  . UMBILICAL HERNIA REPAIR  02/1998   Constitutional Exam  General appearance: Well nourished, well developed, and well hydrated. In no apparent acute distress Vitals:   01/02/16 1350  BP: 124/80  Pulse: 68  Resp: 16  Temp: 98.3 F (36.8 C)  SpO2: 100%  Weight: 125 lb (56.7 kg)  Height: 5\' 2"  (1.575 m)   BMI Assessment: Estimated body mass index is 22.86 kg/m  as calculated from the following:   Height as of this encounter: 5\' 2"  (1.575 m).   Weight as of this encounter: 125 lb (56.7 kg).  BMI interpretation table: BMI level Category Range association with higher incidence of chronic pain  <18 kg/m2 Underweight   18.5-24.9 kg/m2 Ideal body weight   25-29.9 kg/m2 Overweight Increased incidence by 20%  30-34.9 kg/m2 Obese (Class I) Increased incidence by 68%  35-39.9 kg/m2 Severe obesity (Class II) Increased incidence by 136%  >40 kg/m2 Extreme obesity (Class III) Increased incidence by 254%   BMI Readings from Last 4 Encounters:  01/02/16 22.86 kg/m  12/10/15 23.05 kg/m  11/15/15 23.78 kg/m  08/22/15 22.68 kg/m   Wt Readings from Last 4 Encounters:  01/02/16 125 lb (56.7 kg)  12/10/15 126 lb (57.2 kg)  11/15/15 130 lb (59 kg)  08/22/15 124 lb (56.2 kg)  Psych/Mental status: Alert, oriented x 3 (person, place, & time) Eyes: PERLA Respiratory: No evidence of acute respiratory distress  Cervical Spine Exam  Inspection: No masses, redness, or swelling Alignment: Symmetrical Functional ROM: Unrestricted ROM Stability: No instability detected Muscle strength & Tone: Functionally intact Sensory: Unimpaired Palpation: Non-contributory  Upper Extremity (UE) Exam    Side: Right upper extremity  Side: Left upper extremity  Inspection: No masses, redness, swelling, or asymmetry  Inspection: No masses, redness, swelling, or asymmetry  Functional ROM: Unrestricted ROM          Functional ROM: Unrestricted ROM  Muscle strength & Tone: Functionally intact  Muscle strength & Tone: Functionally intact  Sensory: Unimpaired  Sensory: Unimpaired  Palpation: Non-contributory  Palpation: Non-contributory   Thoracic Spine Exam  Inspection: No masses, redness, or swelling Alignment: Symmetrical Functional ROM: Unrestricted ROM Stability: No instability detected Sensory: Unimpaired Muscle strength & Tone: Functionally intact Palpation:  Non-contributory  Lumbar Spine Exam  Inspection: No masses, redness, or swelling Alignment: Symmetrical Functional ROM: Unrestricted ROM Stability: No instability detected Muscle strength & Tone: Functionally intact Sensory: Unimpaired Palpation: Non-contributory Provocative Tests: Lumbar Hyperextension and rotation test: evaluation deferred today       Patrick's Maneuver: Positive for right-sided S-I joint pain          The left side has no pain at this time.  Gait & Posture Assessment  Ambulation: Unassisted Gait: Relatively normal for age and body habitus Posture: WNL   Lower Extremity Exam    Side: Right lower extremity  Side: Left lower extremity  Inspection: No masses, redness, swelling, or asymmetry  Inspection: No masses, redness, swelling, or asymmetry  Functional ROM: Unrestricted ROM          Functional ROM: Unrestricted ROM          Muscle strength & Tone: Functionally intact  Muscle strength & Tone: Functionally intact  Sensory: Unimpaired  Sensory: Unimpaired  Palpation: Non-contributory  Palpation: Non-contributory   Assessment  Primary Diagnosis & Pertinent Problem List: The encounter diagnosis was Pain of right sacroiliac joint.  Visit Diagnosis: 1. Pain of right sacroiliac joint    Plan of Care  Pharmacotherapy (Medications Ordered): No orders of the defined types were placed in this encounter.  New Prescriptions   No medications on file   Medications administered today: Shannon [name] had no medications administered during this visit. Lab-work, procedure(s), and/or referral(s): Orders Placed This Encounter  Procedures  . SACROILIAC JOINT INJECTINS   Imaging and/or referral(s): None  Interventional therapies: Planned, scheduled, and/or pending:   Diagnostic right-sided sacroiliac joint block under fluoroscopic guidance, no sedation.    Considering:   Diagnostic right-sided sacroiliac joint block under fluoroscopic guidance, no sedation.   Palliative right-sided L4-5 lumbar epidural steroid injection under fluoroscopic guidance.  Palliative left-sided sacroiliac joint block under fluoroscopic guidance.    Palliative PRN treatment(s):   Not at this time.   Provider-requested follow-up: Return for procedure (ASAP).  Future Appointments Date Time Provider [place]  02/25/2016 2:30 PM [name], MD [facility] None   Primary Care Physician: [name], MD Location: [facility] Note by: Kathlen Brunswick. Dossie Arbour, M.D, DABA, DABAPM, DABPM, DABIPP, FIPP Date: 01/02/16; Time: 2:16 PM  Pain Score Disclaimer: We use the NRS-11 scale. This is a self-reported, subjective measurement of pain severity with only modest accuracy. It is used primarily to identify changes within a particular patient. It must be understood that outpatient pain scales are significantly less accurate that those used for research, where they can be applied under ideal controlled circumstances with minimal exposure to variables. In reality, the score is likely to be a combination of pain intensity and pain affect, where pain affect describes the degree of emotional arousal or changes in action readiness caused by the sensory experience of pain. Factors such as social and work situation, setting, emotional state, anxiety levels, expectation, and prior pain experience may influence pain perception and show large inter-individual differences that may also be affected by time variables.  Patient instructions provided during this appointment: Patient Instructions  Sacroiliac (SI) Joint Injection Patient  Information  Description: The sacroiliac joint connects the scrum (very low back and tailbone) to the ilium (a pelvic bone which also forms half of the hip joint).  Normally this joint experiences very little motion.  When this joint becomes inflamed or unstable low back and or hip and pelvis pain may result.  Injection of this  joint with local anesthetics (numbing medicines) and steroids can provide diagnostic information and reduce pain.  This injection is performed with the aid of x-ray guidance into the tailbone area while you are lying on your stomach.   You may experience an electrical sensation down the leg while this is being done.  You may also experience numbness.  We also may ask if we are reproducing your normal pain during the injection.  Conditions which may be treated SI injection:   Low back, buttock, hip or leg pain  Preparation for the Injection:  1. Do not eat any solid food or dairy products within 8 hours of your appointment.  2. You may drink clear liquids up to 3 hours before appointment.  Clear liquids include water, black coffee, juice or soda.  No milk or cream please. 3. You may take your regular medications, including pain medications with a sip of water before your appointment.  Diabetics should hold regular insulin (if take separately) and take 1/2 normal NPH dose the morning of the procedure.  Carry some sugar containing items with you to your appointment. 4. A driver must accompany you and be prepared to drive you home after your procedure. 5. Bring all of your current medications with you. 6. An IV may be inserted and sedation may be given at the discretion of the physician. 7. A blood pressure cuff, EKG and other monitors will often be applied during the procedure.  Some patients may need to have extra oxygen administered for a short period.  8. You will be asked to provide medical information, including your allergies, prior to the procedure.  We must know immediately if you are taking blood thinners (like Coumadin/Warfarin) or if you are allergic to IV iodine contrast (dye).  We must know if you could possible be pregnant.  Possible side effects:   Bleeding from needle site  Infection (rare, may require surgery)  Nerve injury (rare)  Numbness & tingling (temporary)  A brief  convulsion or seizure  Light-headedness (temporary)  Pain at injection site (several days)  Decreased blood pressure (temporary)  Weakness in the leg (temporary)   Call if you experience:   New onset weakness or numbness of an extremity below the injection site that last more than 8 hours.  Hives or difficulty breathing ( go to the emergency room)  Inflammation or drainage at the injection site  Any new symptoms which are concerning to you  Please note:  Although the local anesthetic injected can often make your back/ hip/ buttock/ leg feel good for several hours after the injections, the pain will likely return.  It takes 3-7 days for steroids to work in the sacroiliac area.  You may not notice any pain relief for at least that one week.  If effective, we will often do a series of three injections spaced 3-6 weeks apart to maximally decrease your pain.  After the initial series, we generally will wait some months before a repeat injection of the same type.  If you have any questions, please call [phone] [facility]

## 2016-01-14 ENCOUNTER — Ambulatory Visit
Admission: RE | Admit: 2016-01-14 | Discharge: 2016-01-14 | Disposition: A | Discharge status: Home / Self Care | Payer: 59 | Source: Ambulatory Visit | Attending: Pain Medicine | Admitting: Pain Medicine

## 2016-01-14 ENCOUNTER — Encounter: Payer: Self-pay | Admitting: Pain Medicine

## 2016-01-14 ENCOUNTER — Ambulatory Visit (HOSPITAL_BASED_OUTPATIENT_CLINIC_OR_DEPARTMENT_OTHER): Payer: 59 | Admitting: Pain Medicine

## 2016-01-14 VITALS — BP 152/80 | HR 81 | Temp 98.1°F | Resp 20 | Ht 62.0 in | Wt 125.0 lb

## 2016-01-14 DIAGNOSIS — G8929 Other chronic pain: Secondary | ICD-10-CM

## 2016-01-14 DIAGNOSIS — M461 Sacroiliitis, not elsewhere classified: Secondary | ICD-10-CM

## 2016-01-14 DIAGNOSIS — G894 Chronic pain syndrome: Secondary | ICD-10-CM | POA: Diagnosis present

## 2016-01-14 DIAGNOSIS — Z883 Allergy status to other anti-infective agents status: Secondary | ICD-10-CM | POA: Insufficient documentation

## 2016-01-14 DIAGNOSIS — Z882 Allergy status to sulfonamides status: Secondary | ICD-10-CM | POA: Diagnosis not present

## 2016-01-14 DIAGNOSIS — M47818 Spondylosis without myelopathy or radiculopathy, sacral and sacrococcygeal region: Secondary | ICD-10-CM | POA: Diagnosis not present

## 2016-01-14 DIAGNOSIS — M533 Sacrococcygeal disorders, not elsewhere classified: Secondary | ICD-10-CM

## 2016-01-14 DIAGNOSIS — Z885 Allergy status to narcotic agent status: Secondary | ICD-10-CM | POA: Insufficient documentation

## 2016-01-14 MED ORDER — ROPIVACAINE HCL 2 MG/ML IJ SOLN
4.0000 mL | Freq: Once | INTRAMUSCULAR | Status: AC
  Administered 2016-01-14: 4 mL

## 2016-01-14 MED ORDER — METHYLPREDNISOLONE ACETATE 80 MG/ML IJ SUSP
INTRAMUSCULAR | Status: AC
  Administered 2016-01-14: 13:00:00
  Filled 2016-01-14: qty 1

## 2016-01-14 MED ORDER — METHYLPREDNISOLONE ACETATE 80 MG/ML IJ SUSP
80.0000 mg | Freq: Once | INTRAMUSCULAR | Status: AC
  Administered 2016-01-14: 80 mg

## 2016-01-14 MED ORDER — LIDOCAINE HCL (PF) 1 % IJ SOLN
10.0000 mL | Freq: Once | INTRAMUSCULAR | Status: AC
  Administered 2016-01-14: 10 mL

## 2016-01-14 MED ORDER — ROPIVACAINE HCL 2 MG/ML IJ SOLN
INTRAMUSCULAR | Status: AC
  Administered 2016-01-14: 13:00:00
  Filled 2016-01-14: qty 10

## 2016-01-14 NOTE — Progress Notes (Signed)
Patient's Name: Shannon [name]  MRN: [id number]  Referring Provider: Milinda Pointer, MD  DOB: 08-Feb-1965  PCP: [name], MD  DOS: 01/14/2016  Note by: Kathlen Brunswick. Dossie Arbour, MD  Service setting: Ambulatory outpatient  Location: [facility] (AMB) Pain Management Facility  Visit type: Procedure  Specialty: Interventional Pain Management  Patient type: Established   Primary Reason for Visit: Interventional Pain Management Treatment. CC: Hip Pain (right)  Procedure:  Anesthesia, Analgesia, Anxiolysis:  Type: Diagnostic Sacroiliac Joint Steroid Injection Region: Superior Lumbosacral Region Level: PSIS (Posterior Superior Iliac Spine) Laterality: Right-Sided  Type: Local Anesthesia Local Anesthetic: Lidocaine 1% Route: Infiltration (Orrick/IM) IV Access: Declined Sedation: Declined  Indication(s): Analgesia          Indications: 1. Pain of right sacroiliac joint   2. Chronic sacroiliac joint pain (Right)   3. Osteoarthritis of sacroiliac joint (Bilateral) (L>R)    Pain Score: Pre-procedure: 2 /10 Post-procedure: 2 /10  Pre-Procedure Assessment:  Shannon [name] is a 50 y.o. (year old), female patient, seen today for interventional treatment. She  has a past surgical history that includes Septoplasty (5/90); Nasal sinus surgery (11/93); Cesarean section (11/98); Tubal ligation (02/1998); Umbilical hernia repair (02/1998); Cholecystectomy (11/05); and Joint replacement (Left, 2017).. Her primarily concern today is the Hip Pain (right) The primary encounter diagnosis was Pain of right sacroiliac joint. Diagnoses of Chronic sacroiliac joint pain (Right) and Osteoarthritis of sacroiliac joint (Bilateral) (L>R) were also pertinent to this visit.  Pain Type: Chronic pain Pain Location: Hip Pain Orientation: Right Pain Descriptors / Indicators: Aching, Dull, Discomfort, Constant Pain Frequency: Constant  Date of Last Visit: 01/02/16 Service Provided on Last Visit: Evaluation  Coagulation  Parameters Lab Results  Component Value Date   PLT 248.0 03/22/2015   Verification of the correct person, correct site (including marking of site), and correct procedure were performed and confirmed by the patient.  Consent: Before the procedure and under the influence of no sedative(s), amnesic(s), or anxiolytics, the patient was informed of the treatment options, risks and possible complications. To fulfill our ethical and legal obligations, as recommended by the American Medical Association's Code of Ethics, I have informed the patient of my clinical impression; the nature and purpose of the treatment or procedure; the risks, benefits, and possible complications of the intervention; the alternatives, including doing nothing; the risk(s) and benefit(s) of the alternative treatment(s) or procedure(s); and the risk(s) and benefit(s) of doing nothing. The patient was provided information about the general risks and possible complications associated with the procedure. These may include, but are not limited to: failure to achieve desired goals, infection, bleeding, organ or nerve damage, allergic reactions, paralysis, and death. In addition, the patient was informed of those risks and complications associated to the procedure, such as failure to decrease pain; infection; bleeding; organ or nerve damage with subsequent damage to sensory, motor, and/or autonomic systems, resulting in permanent pain, numbness, and/or weakness of one or several areas of the body; allergic reactions; (i.e.: anaphylactic reaction); and/or death. Furthermore, the patient was informed of those risks and complications associated with the medications. These include, but are not limited to: allergic reactions (i.e.: anaphylactic or anaphylactoid reaction(s)); adrenal axis suppression; blood sugar elevation that in diabetics may result in ketoacidosis or comma; water retention that in patients with history of congestive heart failure  may result in shortness of breath, pulmonary edema, and decompensation with resultant heart failure; weight gain; swelling or edema; medication-induced neural toxicity; particulate matter embolism and blood vessel occlusion with resultant organ,  and/or nervous system infarction; and/or aseptic necrosis of one or more joints. Finally, the patient was informed that Medicine is not an exact science; therefore, there is also the possibility of unforeseen or unpredictable risks and/or possible complications that may result in a catastrophic outcome. The patient indicated having understood very clearly. We have given the patient no guarantees and we have made no promises. Enough time was given to the patient to ask questions, all of which were answered to the patient's satisfaction. Shannon [name] has indicated that she wanted to continue with the procedure.  Consent Attestation: I, the ordering provider, attest that I have discussed with the patient the benefits, risks, side-effects, alternatives, likelihood of achieving goals, and potential problems during recovery for the procedure that I have provided informed consent.  Pre-Procedure Preparation:  Safety Precautions: Allergies reviewed. The patient was asked about blood thinners, or active infections, both of which were denied. The patient was asked to confirm the procedure and laterality, before marking the site, and again before commencing the procedure. Appropriate site, procedure, and patient were confirmed by following the Joint Commission's Universal Protocol (UP.01.01.01), in the form of a "Time Out". The patient was asked to participate by confirming the accuracy of the "Time Out" information. Patient was assessed for positional comfort and pressure points before starting the procedure. Allergies: She is allergic to codeine; decongest-aid [pseudoephedrine]; flagyl [metronidazole]; and sulfa antibiotics. Allergy Precautions: None required Infection Control  Precautions: Sterile technique used. Standard Universal Precautions were taken as recommended by the Department of [facility] for Disease Control and Prevention (CDC). Standard pre-surgical skin prep was conducted. Respiratory hygiene and cough etiquette was practiced. Hand hygiene observed. Safe injection practices and needle disposal techniques followed. SDV (single dose vial) medications used. Medications properly checked for expiration dates and contaminants. Personal protective equipment (PPE) used as per protocol. Monitoring:  As per clinic protocol. Vitals:   01/14/16 1110 01/14/16 1238 01/14/16 1247 01/14/16 1252  BP: (!) 136/93 (!) 157/80 (!) 147/80 (!) 152/80  Pulse: 81     Resp: 16 12 12 20   Temp: 98.1 F (36.7 C)     TempSrc: Oral     SpO2: 100% 100% 100% 100%  Weight: 125 lb (56.7 kg)     Height: 5\' 2"  (1.575 m)     Calculated BMI: Body mass index is 22.86 kg/m. Time-out: "Time-out" completed before starting procedure, as per protocol.  Description of Procedure Process:  Time-out: "Time-out" completed before starting procedure, as per protocol. Position: Prone Target Area: Superior, posterior, aspect of the sacroiliac fissure Approach: Posterior, paraspinal, ipsilateral approach. Area Prepped: Entire Lower Lumbosacral Region Prepping solution: ChloraPrep (2% chlorhexidine gluconate and 70% isopropyl alcohol) Safety Precautions: Aspiration looking for blood return was conducted prior to all injections. At no point did we inject any substances, as a needle was being advanced. No attempts were made at seeking any paresthesias. Safe injection practices and needle disposal techniques used. Medications properly checked for expiration dates. SDV (single dose vial) medications used. Description of the Procedure: Protocol guidelines were followed. The patient was placed in position over the procedure table. The target area was identified and the area prepped in the usual  manner. Skin & deeper tissues infiltrated with local anesthetic. Appropriate amount of time allowed to pass for local anesthetics to take effect. The procedure needle was advanced under fluoroscopic guidance into the sacroiliac joint until a firm endpoint was obtained. Proper needle placement secured. Negative aspiration confirmed. Solution injected in intermittent fashion, asking for  systemic symptoms every 0.5cc of injectate. The needles were then removed and the area cleansed, making sure to leave some of the prepping solution back to take advantage of its long term bactericidal properties. EBL: None Materials & Medications:  Needle(s) Type: Spinal needle(s) Gauge: 22G Length: 3.5-in Medication(s): We administered methylPREDNISolone acetate, lidocaine (PF), ropivacaine (PF) 2 mg/mL (0.2%), ropivacaine (PF) 2 mg/mL (0.2%), and methylPREDNISolone acetate. Please see chart orders for dosing details.  Imaging Guidance (Non-Spinal):  Type of Imaging Technique: Fluoroscopy Guidance (Non-Spinal) Indication(s): Assistance in needle guidance and placement for procedures requiring needle placement in or near specific anatomical locations not easily accessible without such assistance. Exposure Time: Please see nurses notes. Contrast: None used. Fluoroscopic Guidance: I was personally present during the use of fluoroscopy. "Tunnel Vision Technique" used to obtain the best possible view of the target area. Parallax error corrected before commencing the procedure. "Direction-depth-direction" technique used to introduce the needle under continuous pulsed fluoroscopy. Once target was reached, antero-posterior, oblique, and lateral fluoroscopic projection used confirm needle placement in all planes. Images permanently stored in EMR. Interpretation: No contrast injected. I personally interpreted the imaging intraoperatively. Adequate needle placement confirmed in multiple planes. Permanent images saved into the  patient's record.  Antibiotic Prophylaxis:  Indication(s): No indications identified. Type:  Antibiotics Given (last 72 hours)    None      Post-operative Assessment:  Complications: No immediate post-treatment complications observed by team, or reported by patient. Disposition: The patient tolerated the entire procedure well. A repeat set of vitals were taken after the procedure and the patient was kept under observation following institutional policy, for this type of procedure. Post-procedural neurological assessment was performed, showing return to baseline, prior to discharge. The patient was provided with post-procedure discharge instructions, including a section on how to identify potential problems. Should any problems arise concerning this procedure, the patient was given instructions to immediately contact us, at any time, without hesitation. In any case, we plan to contact the patient by telephone for a follow-up status report regarding this interventional procedure. Comments:  No additional relevant information.  Plan of Care  Discharge to: Discharge home  Medications ordered for procedure: Meds ordered this encounter  Medications  . methylPREDNISolone acetate (DEPO-MEDROL) injection 80 mg  . lidocaine (PF) (XYLOCAINE) 1 % injection 10 mL  . ropivacaine (PF) 2 mg/mL (0.2%) (NAROPIN) injection 4 mL  . ropivacaine (PF) 2 mg/mL (0.2%) (NAROPIN) 2 MG/ML injection    Willeen Cass L: cabinet override  . methylPREDNISolone acetate (DEPO-MEDROL) 80 MG/ML injection    Willeen Cass L: cabinet override   Medications administered: (For more details, see medical record) We administered methylPREDNISolone acetate, lidocaine (PF), ropivacaine (PF) 2 mg/mL (0.2%), ropivacaine (PF) 2 mg/mL (0.2%), and methylPREDNISolone acetate. Lab-work, Procedure(s), & Referral(s) Ordered: Orders Placed This Encounter  Procedures  . DG C-Arm 1-60 Min-No Report   Imaging Ordered: Results for  orders placed in visit on 12/10/15  DG C-Arm 1-60 Min-No Report   Narrative Fluoroscopy was utilized by the requesting physician.  No radiographic  interpretation.    New Prescriptions   No medications on file   Physician-requested Follow-up:  Return for Post-Procedure evaluation.  Future Appointments Date Time Provider [place]  02/25/2016 2:30 PM [name], MD [facility] None  03/03/2016 1:15 PM Milinda Pointer, MD [facility] None   Primary Care Physician: [name], MD Location: [facility] Note by: Kathlen Brunswick. Dossie Arbour, M.D, DABA, DABAPM, DABPM, DABIPP, FIPP Date: 01/14/16; Time: 1:43 PM  Disclaimer:  Medicine is not an Chief Strategy Officer. The only guarantee in medicine is that nothing is guaranteed. It is important to note that the decision to proceed with this intervention was based on the information collected from the patient. The Data and conclusions were drawn from the patient's questionnaire, the interview, and the physical examination. Because the information was provided in large part by the patient, it cannot be guaranteed that it has not been purposely or unconsciously manipulated. Every effort has been made to obtain as much relevant data as possible for this evaluation. It is important to note that the conclusions that lead to this procedure are derived in large part from the available data. Always take into account that the treatment will also be dependent on availability of resources and existing treatment guidelines, considered by other Pain Management Practitioners as being common knowledge and practice, at the time of the intervention. For Medico-Legal purposes, it is also important to point out that variation in procedural techniques and pharmacological choices are the acceptable norm. The indications, contraindications, technique, and results of the above procedure should only be interpreted and judged by a Board-Certified  Interventional Pain Specialist with extensive familiarity and expertise in the same exact procedure and technique. Attempts at providing opinions without similar or greater experience and expertise than that of the treating physician will be considered as inappropriate and unethical, and shall result in a formal complaint to the state medical board and applicable specialty societies.  Instructions provided at this appointment: Patient Instructions  Sacroiliac (SI) Joint Injection Patient Information  Description: The sacroiliac joint connects the scrum (very low back and tailbone) to the ilium (a pelvic bone which also forms half of the hip joint).  Normally this joint experiences very little motion.  When this joint becomes inflamed or unstable low back and or hip and pelvis pain may result.  Injection of this joint with local anesthetics (numbing medicines) and steroids can provide diagnostic information and reduce pain.  This injection is performed with the aid of x-ray guidance into the tailbone area while you are lying on your stomach.   You may experience an electrical sensation down the leg while this is being done.  You may also experience numbness.  We also may ask if we are reproducing your normal pain during the injection.  Conditions which may be treated SI injection:   Low back, buttock, hip or leg pain  Preparation for the Injection:  1. Do not eat any solid food or dairy products within 8 hours of your appointment.  2. You may drink clear liquids up to 3 hours before appointment.  Clear liquids include water, black coffee, juice or soda.  No milk or cream please. 3. You may take your regular medications, including pain medications with a sip of water before your appointment.  Diabetics should hold regular insulin (if take separately) and take 1/2 normal NPH dose the morning of the procedure.  Carry some sugar containing items with you to your appointment. 4. A driver must accompany  you and be prepared to drive you home after your procedure. 5. Bring all of your current medications with you. 6. An IV may be inserted and sedation may be given at the discretion of the physician. 7. A blood pressure cuff, EKG and other monitors will often be applied during the procedure.  Some patients may need to have extra oxygen administered for a short period.  8. You will be asked to provide medical information, including your  allergies, prior to the procedure.  We must know immediately if you are taking blood thinners (like Coumadin/Warfarin) or if you are allergic to IV iodine contrast (dye).  We must know if you could possible be pregnant.  Possible side effects:   Bleeding from needle site  Infection (rare, may require surgery)  Nerve injury (rare)  Numbness & tingling (temporary)  A brief convulsion or seizure  Light-headedness (temporary)  Pain at injection site (several days)  Decreased blood pressure (temporary)  Weakness in the leg (temporary)   Call if you experience:   New onset weakness or numbness of an extremity below the injection site that last more than 8 hours.  Hives or difficulty breathing ( go to the emergency room)  Inflammation or drainage at the injection site  Any new symptoms which are concerning to you  Please note:  Although the local anesthetic injected can often make your back/ hip/ buttock/ leg feel good for several hours after the injections, the pain will likely return.  It takes 3-7 days for steroids to work in the sacroiliac area.  You may not notice any pain relief for at least that one week.  If effective, we will often do a series of three injections spaced 3-6 weeks apart to maximally decrease your pain.  After the initial series, we generally will wait some months before a repeat injection of the same type.  If you have any questions, please call [phone] [facility]  Pain Management  Discharge Instructions  General Discharge Instructions :  If you need to reach your doctor call: Monday-Friday 8:00 am - 4:00 pm at [phone] or toll free [phone].  After clinic hours [phone] to have operator reach doctor.  Bring all of your medication bottles to all your appointments in the pain clinic.  To cancel or reschedule your appointment with Pain Management please remember to call 24 hours in advance to avoid a fee.  Refer to the educational materials which you have been given on: General Risks, I had my Procedure. Discharge Instructions, Post Sedation.  Post Procedure Instructions:  The drugs you were given will stay in your system until tomorrow, so for the next 24 hours you should not drive, make any legal decisions or drink any alcoholic beverages.  You may eat anything you prefer, but it is better to start with liquids then soups and crackers, and gradually work up to solid foods.  Please notify your doctor immediately if you have any unusual bleeding, trouble breathing or pain that is not related to your normal pain.  Depending on the type of procedure that was done, some parts of your body may feel week and/or numb.  This usually clears up by tonight or the next day.  Walk with the use of an assistive device or accompanied by an adult for the 24 hours.  You may use ice on the affected area for the first 24 hours.  Put ice in a Ziploc bag and cover with a towel and place against area 15 minutes on 15 minutes off.  You may switch to heat after 24 hours.

## 2016-01-14 NOTE — Patient Instructions (Signed)
Sacroiliac (SI) Joint Injection Patient Information  Description: The sacroiliac joint connects the scrum (very low back and tailbone) to the ilium (a pelvic bone which also forms half of the hip joint).  Normally this joint experiences very little motion.  When this joint becomes inflamed or unstable low back and or hip and pelvis pain may result.  Injection of this joint with local anesthetics (numbing medicines) and steroids can provide diagnostic information and reduce pain.  This injection is performed with the aid of x-ray guidance into the tailbone area while you are lying on your stomach.   You may experience an electrical sensation down the leg while this is being done.  You may also experience numbness.  We also may ask if we are reproducing your normal pain during the injection.  Conditions which may be treated SI injection:   Low back, buttock, hip or leg pain  Preparation for the Injection:  1. Do not eat any solid food or dairy products within 8 hours of your appointment.  2. You may drink clear liquids up to 3 hours before appointment.  Clear liquids include water, black coffee, juice or soda.  No milk or cream please. 3. You may take your regular medications, including pain medications with a sip of water before your appointment.  Diabetics should hold regular insulin (if take separately) and take 1/2 normal NPH dose the morning of the procedure.  Carry some sugar containing items with you to your appointment. 4. A driver must accompany you and be prepared to drive you home after your procedure. 5. Bring all of your current medications with you. 6. An IV may be inserted and sedation may be given at the discretion of the physician. 7. A blood pressure cuff, EKG and other monitors will often be applied during the procedure.  Some patients may need to have extra oxygen administered for a short period.  8. You will be asked to provide medical information, including your allergies,  prior to the procedure.  We must know immediately if you are taking blood thinners (like Coumadin/Warfarin) or if you are allergic to IV iodine contrast (dye).  We must know if you could possible be pregnant.  Possible side effects:   Bleeding from needle site  Infection (rare, may require surgery)  Nerve injury (rare)  Numbness & tingling (temporary)  A brief convulsion or seizure  Light-headedness (temporary)  Pain at injection site (several days)  Decreased blood pressure (temporary)  Weakness in the leg (temporary)   Call if you experience:   New onset weakness or numbness of an extremity below the injection site that last more than 8 hours.  Hives or difficulty breathing ( go to the emergency room)  Inflammation or drainage at the injection site  Any new symptoms which are concerning to you  Please note:  Although the local anesthetic injected can often make your back/ hip/ buttock/ leg feel good for several hours after the injections, the pain will likely return.  It takes 3-7 days for steroids to work in the sacroiliac area.  You may not notice any pain relief for at least that one week.  If effective, we will often do a series of three injections spaced 3-6 weeks apart to maximally decrease your pain.  After the initial series, we generally will wait some months before a repeat injection of the same type.  If you have any questions, please call [phone] [facility]  Pain Management Discharge  Instructions  General Discharge Instructions :  If you need to reach your doctor call: Monday-Friday 8:00 am - 4:00 pm at [phone] or toll free [phone].  After clinic hours [phone] to have operator reach doctor.  Bring all of your medication bottles to all your appointments in the pain clinic.  To cancel or reschedule your appointment with Pain Management please remember to call 24 hours in advance to avoid a  fee.  Refer to the educational materials which you have been given on: General Risks, I had my Procedure. Discharge Instructions, Post Sedation.  Post Procedure Instructions:  The drugs you were given will stay in your system until tomorrow, so for the next 24 hours you should not drive, make any legal decisions or drink any alcoholic beverages.  You may eat anything you prefer, but it is better to start with liquids then soups and crackers, and gradually work up to solid foods.  Please notify your doctor immediately if you have any unusual bleeding, trouble breathing or pain that is not related to your normal pain.  Depending on the type of procedure that was done, some parts of your body may feel week and/or numb.  This usually clears up by tonight or the next day.  Walk with the use of an assistive device or accompanied by an adult for the 24 hours.  You may use ice on the affected area for the first 24 hours.  Put ice in a Ziploc bag and cover with a towel and place against area 15 minutes on 15 minutes off.  You may switch to heat after 24 hours. 

## 2016-01-14 NOTE — Progress Notes (Signed)
Safety precautions to be maintained throughout the outpatient stay will include: orient to surroundings, keep bed in low position, maintain call bell within reach at all times, provide assistance with transfer out of bed and ambulation.  

## 2016-01-15 ENCOUNTER — Telehealth: Payer: Self-pay | Admitting: *Deleted

## 2016-01-15 NOTE — Telephone Encounter (Signed)
LVM for patient to return call for any concerns.

## 2016-01-23 ENCOUNTER — Other Ambulatory Visit: Payer: Self-pay | Admitting: Internal Medicine

## 2016-01-24 NOTE — Telephone Encounter (Signed)
Faxed to pharmacy

## 2016-01-24 NOTE — Telephone Encounter (Signed)
Please advise last refill was 10/19 for #30 with one refill and last OV was 10/19

## 2016-01-24 NOTE — Telephone Encounter (Signed)
ok'd xanax #30 with one refill.   

## 2016-01-29 DIAGNOSIS — J301 Allergic rhinitis due to pollen: Secondary | ICD-10-CM | POA: Diagnosis not present

## 2016-01-29 DIAGNOSIS — J3089 Other allergic rhinitis: Secondary | ICD-10-CM | POA: Diagnosis not present

## 2016-01-30 DIAGNOSIS — G43839 Menstrual migraine, intractable, without status migrainosus: Secondary | ICD-10-CM | POA: Diagnosis not present

## 2016-01-30 DIAGNOSIS — G43019 Migraine without aura, intractable, without status migrainosus: Secondary | ICD-10-CM | POA: Diagnosis not present

## 2016-02-04 DIAGNOSIS — K219 Gastro-esophageal reflux disease without esophagitis: Secondary | ICD-10-CM | POA: Diagnosis not present

## 2016-02-04 DIAGNOSIS — R1013 Epigastric pain: Secondary | ICD-10-CM | POA: Diagnosis not present

## 2016-02-12 DIAGNOSIS — J3089 Other allergic rhinitis: Secondary | ICD-10-CM | POA: Diagnosis not present

## 2016-02-12 DIAGNOSIS — J301 Allergic rhinitis due to pollen: Secondary | ICD-10-CM | POA: Diagnosis not present

## 2016-02-19 DIAGNOSIS — J3089 Other allergic rhinitis: Secondary | ICD-10-CM | POA: Diagnosis not present

## 2016-02-19 DIAGNOSIS — J301 Allergic rhinitis due to pollen: Secondary | ICD-10-CM | POA: Diagnosis not present

## 2016-02-25 ENCOUNTER — Other Ambulatory Visit (HOSPITAL_COMMUNITY)
Admission: RE | Admit: 2016-02-25 | Discharge: 2016-02-25 | Disposition: A | Discharge status: Home / Self Care | Payer: 59 | Source: Ambulatory Visit | Attending: Internal Medicine | Admitting: Internal Medicine

## 2016-02-25 ENCOUNTER — Ambulatory Visit (INDEPENDENT_AMBULATORY_CARE_PROVIDER_SITE_OTHER): Payer: 59 | Admitting: Internal Medicine

## 2016-02-25 ENCOUNTER — Encounter: Payer: Self-pay | Admitting: Internal Medicine

## 2016-02-25 VITALS — BP 124/76 | HR 61 | Temp 98.6°F | Resp 16 | Ht 62.0 in | Wt 130.0 lb

## 2016-02-25 DIAGNOSIS — E0590 Thyrotoxicosis, unspecified without thyrotoxic crisis or storm: Secondary | ICD-10-CM

## 2016-02-25 DIAGNOSIS — R2231 Localized swelling, mass and lump, right upper limb: Secondary | ICD-10-CM

## 2016-02-25 DIAGNOSIS — Z13220 Encounter for screening for lipoid disorders: Secondary | ICD-10-CM

## 2016-02-25 DIAGNOSIS — Z96642 Presence of left artificial hip joint: Secondary | ICD-10-CM | POA: Diagnosis not present

## 2016-02-25 DIAGNOSIS — Z0000 Encounter for general adult medical examination without abnormal findings: Secondary | ICD-10-CM

## 2016-02-25 DIAGNOSIS — Z1151 Encounter for screening for human papillomavirus (HPV): Secondary | ICD-10-CM | POA: Insufficient documentation

## 2016-02-25 DIAGNOSIS — K219 Gastro-esophageal reflux disease without esophagitis: Secondary | ICD-10-CM

## 2016-02-25 DIAGNOSIS — F419 Anxiety disorder, unspecified: Secondary | ICD-10-CM

## 2016-02-25 DIAGNOSIS — Z01419 Encounter for gynecological examination (general) (routine) without abnormal findings: Secondary | ICD-10-CM | POA: Insufficient documentation

## 2016-02-25 MED ORDER — ALPRAZOLAM 0.25 MG PO TABS: ORAL_TABLET | ORAL | 1 refills | Status: DC

## 2016-02-25 NOTE — Assessment & Plan Note (Addendum)
Physical today 02/25/16.   PAP smear 02/25/16.  Just had mammogram and biopsy as outlined.  Biopsy negative.  Colonoscopy due 11/2014.

## 2016-02-25 NOTE — Progress Notes (Signed)
Patient ID: Shannon [name], female   DOB: [birth year], 51 y.o.   MRN: [id number]   Subjective:    Patient ID: Shannon [name], female    DOB: 10-19-1965, 51 y.o.   MRN: [id number]  HPI  Patient here for her physical exam.  States she is doing well.  Feels good.  Stays active.  Saw GI (Dr Elie Confer).  On prilosec.  No chest pain.  No sob.  No acid reflux.  No abdominal pain or cramping.  Bowels stable.  Recent biopsy breast negative.     Past Medical History:  Diagnosis Date  . Anemia   . Breast pain, right 11/07/2012  . Breathing difficulty 06/20/2014  . Environmental allergies   . GERD (gastroesophageal reflux disease)   . Hiatal hernia    small  . History of migraine headaches   . Hyperthyroidism    s/p ablation  . Vaginitis 01/16/2013   Past Surgical History:  Procedure Laterality Date  . CESAREAN SECTION  11/98   Dr Roena Malady  . CHOLECYSTECTOMY  11/05   Dr Pat Patrick  . JOINT REPLACEMENT Left 2017  . NASAL SINUS SURGERY  11/93  . SEPTOPLASTY  5/90   Dr Rossie Muskrat  . TUBAL LIGATION  02/1998  . UMBILICAL HERNIA REPAIR  02/1998   Family History  Problem Relation Age of Onset  . Heart disease Father   . Hypertension Father   . Thyroid disease Father   . Hypercholesterolemia Father   . Kidney cancer Father   . Leukemia Father     hairy cell  . Hypercholesterolemia Mother   . Thyroid disease Mother   . Breast cancer Mother 60  . Breast cancer      maternal great grandmother  . Heart disease Maternal Grandfather     MI - age 78  . Prostate cancer Paternal Grandfather   . Pancreatic cancer Paternal Grandfather   . Lung cancer Paternal Grandfather    Social History   Social History  . Marital status: Married    Spouse name: N/A  . Number of children: 1  . Years of education: N/A   Social History Main Topics  . Smoking status: Former Research scientist (life sciences)  . Smokeless tobacco: Never Used  . Alcohol use No  . Drug use: No  . Sexual activity: Not Asked   Other Topics Concern  . None    Social History Narrative  . None    Outpatient Encounter Prescriptions as of 02/25/2016  Medication Sig  . acetaminophen (TYLENOL) 500 MG tablet Take 1,000 mg by mouth every 6 (six) hours as needed for moderate pain.  Marland Kitchen ALPRAZolam (XANAX) 0.25 MG tablet TAKE ONE TABLET BY MOUTH AT BEDTIME AS NEEDED FOR ANXIETY  . amoxicillin (AMOXIL) 500 MG capsule Take four tablets by mouth one hour prior to dental cleanings  . omeprazole (PRILOSEC) 40 MG capsule Take 40 mg by mouth daily.  . ranitidine (ZANTAC) 150 MG tablet Take 150 mg by mouth as needed.   . [DISCONTINUED] ALPRAZolam (XANAX) 0.25 MG tablet TAKE ONE TABLET BY MOUTH AT BEDTIME AS NEEDED FOR ANXIETY  . [DISCONTINUED] amoxicillin (AMOXIL) 500 MG capsule   . [DISCONTINUED] oxyCODONE (OXY IR/ROXICODONE) 5 MG immediate release tablet   . [DISCONTINUED] polyethylene glycol powder (GLYCOLAX/MIRALAX) powder   . [DISCONTINUED] gabapentin (NEURONTIN) 300 MG capsule   . [DISCONTINUED] meloxicam (MOBIC) 7.5 MG tablet    No facility-administered encounter medications on file as of 02/25/2016.     Review of Systems  Constitutional: Negative  for appetite change and unexpected weight change.  HENT: Negative for congestion and sinus pressure.   Eyes: Negative for pain and visual disturbance.  Respiratory: Negative for cough, chest tightness and shortness of breath.   Cardiovascular: Negative for chest pain, palpitations and leg swelling.  Gastrointestinal: Negative for abdominal pain, diarrhea, nausea and vomiting.  Genitourinary: Negative for difficulty urinating and dysuria.  Musculoskeletal: Negative for back pain and joint swelling.  Skin: Negative for color change and rash.  Neurological: Negative for dizziness, light-headedness and headaches.  Hematological: Negative for adenopathy. Does not bruise/bleed easily.  Psychiatric/Behavioral: Negative for agitation and dysphoric mood.       Objective:    Physical Exam  Constitutional: She  is oriented to person, place, and time. She appears well-developed and well-nourished. No distress.  HENT:  Nose: Nose normal.  Mouth/Throat: Oropharynx is clear and moist.  Eyes: Right eye exhibits no discharge. Left eye exhibits no discharge. No scleral icterus.  Neck: Neck supple. No thyromegaly present.  Cardiovascular: Normal rate and regular rhythm.   Pulmonary/Chest: Breath sounds normal. No accessory muscle usage. No tachypnea. No respiratory distress. She has no decreased breath sounds. She has no wheezes. She has no rhonchi. Right breast exhibits no inverted nipple, no mass, no nipple discharge and no tenderness (no axillary adenopathy). Left breast exhibits no inverted nipple, no mass, no nipple discharge and no tenderness (no axilarry adenopathy).  Abdominal: Soft. Bowel sounds are normal. There is no tenderness.  Genitourinary:  Genitourinary Comments: Normal external genitalia.  Vaginal vault without lesions.  Cervix identified.  Pap smear performed.  Could not appreciate any adnexal masses or tenderness.    Musculoskeletal: She exhibits no edema or tenderness.  Lymphadenopathy:    She has no cervical adenopathy.  Neurological: She is alert and oriented to person, place, and time.  Skin: Skin is warm. No rash noted. No erythema.  Psychiatric: She has a normal mood and affect. Her behavior is normal.    BP 124/76 (BP Location: Left Arm, Patient Position: Sitting, Cuff Size: Large)   Pulse 61   Temp 98.6 F (37 C) (Oral)   Resp 16   Ht 5\' 2"  (1.575 m)   Wt 130 lb (59 kg)   LMP 02/12/2013   SpO2 99%   BMI 23.78 kg/m  Wt Readings from Last 3 Encounters:  02/25/16 130 lb (59 kg)  01/14/16 125 lb (56.7 kg)  01/02/16 125 lb (56.7 kg)     Lab Results  Component Value Date   WBC 5.1 03/22/2015   HGB 13.8 03/22/2015   HCT 40.6 03/22/2015   PLT 248.0 03/22/2015   GLUCOSE 100 (H) 03/22/2015   CHOL 179 08/30/2014   TRIG 129.0 08/30/2014   HDL 63.70 08/30/2014   LDLCALC  90 08/30/2014   ALT 15 03/22/2015   AST 17 03/22/2015   NA 140 03/22/2015   K 3.9 03/22/2015   CL 105 03/22/2015   CREATININE 0.74 03/22/2015   BUN 11 03/22/2015   CO2 27 03/22/2015   TSH 1.35 11/15/2015    Dg C-arm 1-60 Min-no Report  Result Date: 01/14/2016 There is no Radiologist interpretation  for this exam.      Assessment & Plan:   Problem List Items Addressed This Visit    Anxiety    Takes xanax prn.  Follow.        Relevant Medications   ALPRAZolam (XANAX) 0.25 MG tablet   Axillary lump    S/p biopsy.  Negative.  Continue with  regular mammograms.        GERD (gastroesophageal reflux disease)    Sees GI.  Doing well on current regimen.  Follow.        Health care maintenance    Physical today 02/25/16.   PAP smear 02/25/16.  Just had mammogram and biopsy as outlined.  Biopsy negative.  Colonoscopy due 11/2014.        Relevant Orders   Cytology - PAP (Completed)   History of left hip replacement    Doing well.  No pain.  Follow.       Hyperthyroidism    S/p ablation.  Follow tsh.        Relevant Orders   CBC with Differential/Platelet   Comprehensive metabolic panel   TSH    Other Visit Diagnoses    Routine general medical examination at a health care facility    -  Primary   Screening cholesterol level       Relevant Orders   Lipid panel       [name], MD

## 2016-02-25 NOTE — Progress Notes (Signed)
Pre-visit discussion using our clinic review tool. No additional management support is needed unless otherwise documented below in the visit note.  

## 2016-02-26 DIAGNOSIS — J301 Allergic rhinitis due to pollen: Secondary | ICD-10-CM | POA: Diagnosis not present

## 2016-02-26 DIAGNOSIS — J3089 Other allergic rhinitis: Secondary | ICD-10-CM | POA: Diagnosis not present

## 2016-02-27 LAB — CYTOLOGY - PAP
DIAGNOSIS: NEGATIVE
HPV (WINDOPATH): NOT DETECTED

## 2016-02-28 ENCOUNTER — Encounter: Payer: Self-pay | Admitting: Internal Medicine

## 2016-03-02 ENCOUNTER — Encounter: Payer: Self-pay | Admitting: Internal Medicine

## 2016-03-02 NOTE — Assessment & Plan Note (Signed)
Takes xanax prn.  Follow.   

## 2016-03-02 NOTE — Assessment & Plan Note (Signed)
S/p biopsy.  Negative.  Continue with regular mammograms.

## 2016-03-02 NOTE — Assessment & Plan Note (Signed)
Sees GI.  Doing well on current regimen.  Follow.

## 2016-03-02 NOTE — Assessment & Plan Note (Signed)
Doing well.  No pain.  Follow.

## 2016-03-02 NOTE — Assessment & Plan Note (Signed)
S/p ablation.  Follow tsh.  

## 2016-03-03 ENCOUNTER — Ambulatory Visit: Payer: 59 | Attending: Pain Medicine | Admitting: Pain Medicine

## 2016-03-03 ENCOUNTER — Encounter: Payer: Self-pay | Admitting: Pain Medicine

## 2016-03-03 VITALS — BP 145/95 | HR 72 | Temp 98.6°F | Resp 16 | Ht 62.0 in | Wt 126.0 lb

## 2016-03-03 DIAGNOSIS — Z79899 Other long term (current) drug therapy: Secondary | ICD-10-CM | POA: Insufficient documentation

## 2016-03-03 DIAGNOSIS — M48061 Spinal stenosis, lumbar region without neurogenic claudication: Secondary | ICD-10-CM | POA: Insufficient documentation

## 2016-03-03 DIAGNOSIS — D649 Anemia, unspecified: Secondary | ICD-10-CM | POA: Diagnosis not present

## 2016-03-03 DIAGNOSIS — E0590 Thyrotoxicosis, unspecified without thyrotoxic crisis or storm: Secondary | ICD-10-CM | POA: Insufficient documentation

## 2016-03-03 DIAGNOSIS — Z882 Allergy status to sulfonamides status: Secondary | ICD-10-CM | POA: Diagnosis not present

## 2016-03-03 DIAGNOSIS — Z885 Allergy status to narcotic agent status: Secondary | ICD-10-CM | POA: Diagnosis not present

## 2016-03-03 DIAGNOSIS — Z8249 Family history of ischemic heart disease and other diseases of the circulatory system: Secondary | ICD-10-CM | POA: Insufficient documentation

## 2016-03-03 DIAGNOSIS — Z87891 Personal history of nicotine dependence: Secondary | ICD-10-CM | POA: Insufficient documentation

## 2016-03-03 DIAGNOSIS — G8929 Other chronic pain: Secondary | ICD-10-CM | POA: Insufficient documentation

## 2016-03-03 DIAGNOSIS — M545 Low back pain: Secondary | ICD-10-CM | POA: Diagnosis present

## 2016-03-03 DIAGNOSIS — F419 Anxiety disorder, unspecified: Secondary | ICD-10-CM | POA: Diagnosis not present

## 2016-03-03 DIAGNOSIS — Z888 Allergy status to other drugs, medicaments and biological substances status: Secondary | ICD-10-CM | POA: Insufficient documentation

## 2016-03-03 DIAGNOSIS — Z806 Family history of leukemia: Secondary | ICD-10-CM | POA: Insufficient documentation

## 2016-03-03 DIAGNOSIS — M47818 Spondylosis without myelopathy or radiculopathy, sacral and sacrococcygeal region: Secondary | ICD-10-CM | POA: Insufficient documentation

## 2016-03-03 DIAGNOSIS — Z803 Family history of malignant neoplasm of breast: Secondary | ICD-10-CM | POA: Insufficient documentation

## 2016-03-03 DIAGNOSIS — M791 Myalgia: Secondary | ICD-10-CM | POA: Diagnosis not present

## 2016-03-03 DIAGNOSIS — M47816 Spondylosis without myelopathy or radiculopathy, lumbar region: Secondary | ICD-10-CM | POA: Diagnosis not present

## 2016-03-03 DIAGNOSIS — M1611 Unilateral primary osteoarthritis, right hip: Secondary | ICD-10-CM | POA: Diagnosis not present

## 2016-03-03 DIAGNOSIS — K219 Gastro-esophageal reflux disease without esophagitis: Secondary | ICD-10-CM | POA: Diagnosis not present

## 2016-03-03 DIAGNOSIS — Z96642 Presence of left artificial hip joint: Secondary | ICD-10-CM | POA: Diagnosis not present

## 2016-03-03 DIAGNOSIS — Z8349 Family history of other endocrine, nutritional and metabolic diseases: Secondary | ICD-10-CM | POA: Insufficient documentation

## 2016-03-03 DIAGNOSIS — Z8051 Family history of malignant neoplasm of kidney: Secondary | ICD-10-CM | POA: Insufficient documentation

## 2016-03-03 DIAGNOSIS — Z883 Allergy status to other anti-infective agents status: Secondary | ICD-10-CM | POA: Diagnosis not present

## 2016-03-03 DIAGNOSIS — M7918 Myalgia, other site: Secondary | ICD-10-CM

## 2016-03-03 MED ORDER — ROPIVACAINE HCL 2 MG/ML IJ SOLN: 5.0000 mL | Freq: Once | INTRAMUSCULAR | Status: DC

## 2016-03-03 MED ORDER — TRIAMCINOLONE ACETONIDE 40 MG/ML IJ SUSP: 40.0000 mg | Freq: Once | INTRAMUSCULAR | Status: DC

## 2016-03-03 MED ORDER — TRIAMCINOLONE ACETONIDE 40 MG/ML IJ SUSP
INTRAMUSCULAR | Status: AC
  Administered 2016-03-03: 14:00:00
  Filled 2016-03-03: qty 1

## 2016-03-03 MED ORDER — LIDOCAINE HCL (PF) 1 % IJ SOLN
INTRAMUSCULAR | Status: AC
  Administered 2016-03-03: 14:00:00
  Filled 2016-03-03: qty 5

## 2016-03-03 MED ORDER — ROPIVACAINE HCL 2 MG/ML IJ SOLN
INTRAMUSCULAR | Status: AC
  Administered 2016-03-03: 14:00:00
  Filled 2016-03-03: qty 10

## 2016-03-03 MED ORDER — LIDOCAINE HCL (PF) 1 % IJ SOLN: 5.0000 mL | Freq: Once | INTRAMUSCULAR | Status: DC

## 2016-03-03 NOTE — Patient Instructions (Signed)
Trigger Point Injection Trigger points are areas where you have pain. A trigger point injection is a shot given in the trigger point to help relieve pain for a few days to a few months. Common places for trigger points include:  The neck.  The shoulders.  The upper back.  The lower back. A trigger point injection will not cure long-lasting (chronic) pain permanently. These injections do not always work for every person, but for some people they can help to relieve pain for a few days to a few months. Tell a health care provider about:  Any allergies you have.  All medicines you are taking, including vitamins, herbs, eye drops, creams, and over-the-counter medicines.  Any problems you or family members have had with anesthetic medicines.  Any blood disorders you have.  Any surgeries you have had.  Any medical conditions you have. What are the risks? Generally, this is a safe procedure. However, problems may occur, including:  Infection.  Bleeding.  Allergic reaction to the injected medicine.  Irritation of the skin around the injection site. What happens before the procedure?  Ask your health care provider about changing or stopping your regular medicines. This is especially important if you are taking diabetes medicines or blood thinners. What happens during the procedure?  Your health care provider will feel for trigger points. A marker may be used to circle the area for the injection.  The skin over the trigger point will be washed with a germ-killing (antiseptic) solution.  A thin needle is used for the shot. You may feel pain or a twitching feeling when the needle enters the trigger point.  A numbing solution may be injected into the trigger point. Sometimes a medicine to keep down swelling, redness, and warmth (inflammation) is also injected.  Your health care provider may move the needle around the area where the trigger point is located until the tightness and  twitching goes away.  After the injection, your health care provider may put gentle pressure over the injection site.  The injection site will be covered with a bandage (dressing). The procedure may vary among health care providers and hospitals. What happens after the procedure?  The dressing can be taken off in a few hours or as told by your health care provider.  You may feel sore and stiff for 1-2 days. This information is not intended to replace advice given to you by your health care provider. Make sure you discuss any questions you have with your health care provider. Document Released: 01/02/2011 Document Revised: 09/16/2015 Document Reviewed: 07/03/2014 Elsevier Interactive Patient Education  2017 [employer]  What are the risk, side effects and possible complications? Generally speaking, most procedures are safe.  However, with any procedure there are risks, side effects, and the possibility of complications.  The risks and complications are dependent upon the sites that are lesioned, or the type of nerve block to be performed.  The closer the procedure is to the spine, the more serious the risks are.  Great care is taken when placing the radio frequency needles, block needles or lesioning probes, but sometimes complications can occur. 1. Infection: Any time there is an injection through the skin, there is a risk of infection.  This is why sterile conditions are used for these blocks.  There are four possible types of infection. 1. Localized skin infection. 2. Central Nervous System Infection-This can be in the form of Meningitis, which can be deadly. 3. Epidural  Infections-This can be in the form of an epidural abscess, which can cause pressure inside of the spine, causing compression of the spinal cord with subsequent paralysis. This would require an emergency surgery to decompress, and there are no guarantees that the patient would recover from the  paralysis. 4. Discitis-This is an infection of the intervertebral discs.  It occurs in about 1% of discography procedures.  It is difficult to treat and it may lead to surgery.        2. Pain: the needles have to go through skin and soft tissues, will cause soreness.       3. Damage to internal structures:  The nerves to be lesioned may be near blood vessels or    other nerves which can be potentially damaged.       4. Bleeding: Bleeding is more common if the patient is taking blood thinners such as  aspirin, Coumadin, Ticiid, Plavix, etc., or if he/she have some genetic predisposition  such as hemophilia. Bleeding into the spinal canal can cause compression of the spinal  cord with subsequent paralysis.  This would require an emergency surgery to  decompress and there are no guarantees that the patient would recover from the  paralysis.       5. Pneumothorax:  Puncturing of a lung is a possibility, every time a needle is introduced in  the area of the chest or upper back.  Pneumothorax refers to free air around the  collapsed lung(s), inside of the thoracic cavity (chest cavity).  Another two possible  complications related to a similar event would include: Hemothorax and Chylothorax.   These are variations of the Pneumothorax, where instead of air around the collapsed  lung(s), you may have blood or chyle, respectively.       6. Spinal headaches: They may occur with any procedures in the area of the spine.       7. Persistent CSF (Cerebro-Spinal Fluid) leakage: This is a rare problem, but may occur  with prolonged intrathecal or epidural catheters either due to the formation of a fistulous  track or a dural tear.       8. Nerve damage: By working so close to the spinal cord, there is always a possibility of  nerve damage, which could be as serious as a permanent spinal cord injury with  paralysis.       9. Death:  Although rare, severe deadly allergic reactions known as "Anaphylactic  reaction" can  occur to any of the medications used.      10. Worsening of the symptoms:  We can always make thing worse.  What are the chances of something like this happening? Chances of any of this occuring are extremely low.  By statistics, you have more of a chance of getting killed in a motor vehicle accident: while driving to the hospital than any of the above occurring .  Nevertheless, you should be aware that they are possibilities.  In general, it is similar to taking a shower.  Everybody knows that you can slip, hit your head and get killed.  Does that mean that you should not shower again?  Nevertheless always keep in mind that statistics do not mean anything if you happen to be on the wrong side of them.  Even if a procedure has a 1 (one) in a 1,000,000 (million) chance of going wrong, it you happen to be that one..Also, keep in mind that by statistics, you have more of a chance  of having something go wrong when taking medications.  Who should not have this procedure? If you are on a blood thinning medication (e.g. Coumadin, Plavix, see list of "Blood Thinners"), or if you have an active infection going on, you should not have the procedure.  If you are taking any blood thinners, please inform your physician.  How should I prepare for this procedure?  Do not eat or drink anything at least six hours prior to the procedure.  Bring a driver with you .  It cannot be a taxi.  Come accompanied by an adult that can drive you back, and that is strong enough to help you if your legs get weak or numb from the local anesthetic.  Take all of your medicines the morning of the procedure with just enough water to swallow them.  If you have diabetes, make sure that you are scheduled to have your procedure done first thing in the morning, whenever possible.  If you have diabetes, take only half of your insulin dose and notify our nurse that you have done so as soon as you arrive at the clinic.  If you are  diabetic, but only take blood sugar pills (oral hypoglycemic), then do not take them on the morning of your procedure.  You may take them after you have had the procedure.  Do not take aspirin or any aspirin-containing medications, at least eleven (11) days prior to the procedure.  They may prolong bleeding.  Wear loose fitting clothing that may be easy to take off and that you would not mind if it got stained with Betadine or blood.  Do not wear any jewelry or perfume  Remove any nail coloring.  It will interfere with some of our monitoring equipment.  NOTE: Remember that this is not meant to be interpreted as a complete list of all possible complications.  Unforeseen problems may occur.  BLOOD THINNERS The following drugs contain aspirin or other products, which can cause increased bleeding during surgery and should not be taken for 2 weeks prior to and 1 week after surgery.  If you should need take something for relief of minor pain, you may take acetaminophen which is found in Tylenol,m Datril, Anacin-3 and Panadol. It is not blood thinner. The products listed below are.  Do not take any of the products listed below in addition to any listed on your instruction sheet.  A.P.C or A.P.C with Codeine Codeine Phosphate Capsules #3 Ibuprofen Ridaura  ABC compound Congesprin Imuran rimadil  Advil Cope Indocin Robaxisal  Alka-Seltzer Effervescent Pain Reliever and Antacid Coricidin or Coricidin-D  Indomethacin Rufen  Alka-Seltzer plus Cold Medicine Cosprin Ketoprofen S-A-C Tablets  Anacin Analgesic Tablets or Capsules Coumadin Korlgesic Salflex  Anacin Extra Strength Analgesic tablets or capsules CP-2 Tablets Lanoril Salicylate  Anaprox Cuprimine Capsules Levenox Salocol  Anexsia-D Dalteparin Magan Salsalate  Anodynos Darvon compound Magnesium Salicylate Sine-off  Ansaid Dasin Capsules Magsal Sodium Salicylate  Anturane Depen Capsules Marnal Soma  APF Arthritis pain formula Dewitt's Pills  Measurin Stanback  Argesic Dia-Gesic Meclofenamic Sulfinpyrazone  Arthritis Bayer Timed Release Aspirin Diclofenac Meclomen Sulindac  Arthritis pain formula Anacin Dicumarol Medipren Supac  Analgesic (Safety coated) Arthralgen Diffunasal Mefanamic Suprofen  Arthritis Strength Bufferin Dihydrocodeine Mepro Compound Suprol  Arthropan liquid Dopirydamole Methcarbomol with Aspirin Synalgos  ASA tablets/Enseals Disalcid Micrainin Tagament  Ascriptin Doan's Midol Talwin  Ascriptin A/D Dolene Mobidin Tanderil  Ascriptin Extra Strength Dolobid Moblgesic Ticlid  Ascriptin with Codeine Doloprin or Doloprin with Codeine Momentum  Tolectin  Asperbuf Duoprin Mono-gesic Trendar  Aspergum Duradyne Motrin or Motrin IB Triminicin  Aspirin plain, buffered or enteric coated Durasal Myochrisine Trigesic  Aspirin Suppositories Easprin Nalfon Trillsate  Aspirin with Codeine Ecotrin Regular or Extra Strength Naprosyn Uracel  Atromid-S Efficin Naproxen Ursinus  Auranofin Capsules Elmiron Neocylate Vanquish  Axotal Emagrin Norgesic Verin  Azathioprine Empirin or Empirin with Codeine Normiflo Vitamin E  Azolid Emprazil Nuprin Voltaren  Bayer Aspirin plain, buffered or children's or timed BC Tablets or powders Encaprin Orgaran Warfarin Sodium  Buff-a-Comp Enoxaparin Orudis Zorpin  Buff-a-Comp with Codeine Equegesic Os-Cal-Gesic   Buffaprin Excedrin plain, buffered or Extra Strength Oxalid   Bufferin Arthritis Strength Feldene Oxphenbutazone   Bufferin plain or Extra Strength Feldene Capsules Oxycodone with Aspirin   Bufferin with Codeine Fenoprofen Fenoprofen Pabalate or Pabalate-SF   Buffets II Flogesic Panagesic   Buffinol plain or Extra Strength Florinal or Florinal with Codeine Panwarfarin   Buf-Tabs Flurbiprofen Penicillamine   Butalbital Compound Four-way cold tablets Penicillin   Butazolidin Fragmin Pepto-Bismol   Carbenicillin Geminisyn Percodan   Carna Arthritis Reliever Geopen Persantine     Carprofen Gold's salt Persistin   Chloramphenicol Goody's Phenylbutazone   Chloromycetin Haltrain Piroxlcam   Clmetidine heparin Plaquenil   Cllnoril Hyco-pap Ponstel   Clofibrate Hydroxy chloroquine Propoxyphen         Before stopping any of these medications, be sure to consult the physician who ordered them.  Some, such as Coumadin (Warfarin) are ordered to prevent or treat serious conditions such as "deep thrombosis", "pumonary embolisms", and other heart problems.  The amount of time that you may need off of the medication may also vary with the medication and the reason for which you were taking it.  If you are taking any of these medications, please make sure you notify your pain physician before you undergo any procedures.

## 2016-03-03 NOTE — Progress Notes (Signed)
Patient's Name: Shannon [name]  MRN: [id number]  Referring Provider: [name], MD  DOB: May 09, 1965  PCP: [name], MD  DOS: 03/03/2016  Note by: Kathlen Brunswick. Dossie Arbour, MD  Service setting: Ambulatory outpatient  Specialty: Interventional Pain Management  Location: [facility] (AMB) Pain Management Facility  Trigger Point Injection Today  Patient type: Established   Primary Reason(s) for Visit: Encounter for post-procedure evaluation of chronic illness with mild to moderate exacerbation CC: Back Pain (right lower back)  HPI  Shannon [name] is a 51 y.o. year old, female patient, who comes today for a post-procedure evaluation. She has Hyperthyroidism; GERD (gastroesophageal reflux disease); Anemia; Migraine; Menopausal symptoms; Health care maintenance; Anxiety; Atypical chest pain; Abdominal pain, epigastric; Gastro-esophageal reflux disease without esophagitis; Chronic pain; Chronic low back pain (Left); Lumbar lateral recess stenosis (L4-5) (Left); Lumbar facet hypertrophy (L4-5) (Left); Chronic lumbar radicular pain (Location of Tertiary source of pain) (intermittent) (L4 Dermatome) (Left); Chronic hip pain (Location of Primary Source of Pain) (intermittent) (Left); Chronic sacroiliac joint pain (Location of Secondary source of pain) (intermittent) (Left); Nicotine dependence; Tobacco abuse; Osteoarthritis of hip (bone-on-bone) (Left); Lumbar spondylosis; Encounter for chronic pain management; Post-menopausal bleeding; Axillary lump; Muscle ache; Hemorrhage, postmenopausal; Osteoarthritis of sacroiliac joint (Bilateral) (L>R); Eructation; History of left hip replacement; Primary osteoarthritis of left hip; Pain of right sacroiliac joint; Chronic sacroiliac joint pain (Right); Myofascial pain syndrome (Right Gluteous Muscle); and Gluteal pain (Right) on her problem list. Her primarily concern today is the Back Pain (right lower back)  Pain Assessment: Self-Reported Pain Score: 3 /10             Reported  level is compatible with observation.       Pain Type: Chronic pain Pain Location: Back Pain Orientation: Lower, Right Pain Descriptors / Indicators: Aching Pain Frequency: Constant  Shannon [name] comes in today for post-procedure evaluation after the treatment done on 01/14/2016.  Further details on both, my assessment(s), as well as the proposed treatment plan, please see below.  Post-Procedure Assessment  01/14/2016 Procedure: Diagnostic right-sided sacroiliac joint injection under fluoroscopic guidance no sedation. Post-procedure pain score: 2/10 No relief Influential Factors: BMI: 23.05 kg/m Intra-procedural challenges: None observed Assessment challenges: None detected         Post-procedural side-effects, adverse reactions, or complications: None reported Reported issues: None  Sedation: No sedation used. When no sedatives are used, the analgesic levels obtained are directly associated to the effectiveness of the local anesthetics. However, when sedation is provided, the level of analgesia obtained during the initial 1 hour following the intervention, is believed to be the result of a combination of factors. These factors may include, but are not limited to: 1. The effectiveness of the local anesthetics used. 2. The effects of the analgesic(s) and/or anxiolytic(s) used. 3. The degree of discomfort experienced by the patient at the time of the procedure. 4. The patients ability and reliability in recalling and recording the events. 5. The presence and influence of possible secondary gains and/or psychosocial factors. Reported result: Relief experienced during the 1st hour after the procedure: 100 % (Ultra-Short Term Relief) Interpretative annotation: Analgesia during this period is likely to be Local Anesthetic and/or IV Sedative (Analgesic/Anxiolitic) related.          Effects of local anesthetic: The analgesic effects attained during this period are directly associated to the  localized infiltration of local anesthetics and therefore cary significant diagnostic value as to the etiological location, or anatomical origin, of the pain. Expected duration of relief  is directly dependent on the pharmacodynamics of the local anesthetic used. Long-acting (4-6 hours) anesthetics used.  Reported result: Relief during the next 4 to 6 hour after the procedure: 100 % (Short-Term Relief) Interpretative annotation: Complete relief would suggest area to be the source of the pain.          Long-term benefit: Defined as the period of time past the expected duration of local anesthetics. With the possible exception of prolonged sympathetic blockade from the local anesthetics, benefits during this period are typically attributed to, or associated with, other factors such as analgesic sensory neuropraxia, antiinflammatory effects, or beneficial biochemical changes provided by agents other than the local anesthetics Reported result: Extended relief following procedure: 100 % (ongoing) (Long-Term Relief) Interpretative annotation: Good relief. This could suggest inflammation to be a significant component in the etiology to the pain.          Current benefits: Defined as persistent relief that continues at this point in time.   Reported results: Treated area: 100 % Shannon [name] reports improvement in function Interpretative annotation: Ongoing benefits would suggest effective therapeutic approach  Interpretation: Results would suggest a successful diagnostic intervention.          Laboratory Chemistry  Inflammation Markers Lab Results  Component Value Date   ESRSEDRATE 8 05/22/2015   Renal Function Lab Results  Component Value Date   BUN 11 03/22/2015   CREATININE 0.74 03/22/2015   GFRAA >60 05/02/2014   GFRNONAA >60 05/02/2014   Hepatic Function Lab Results  Component Value Date   AST 17 03/22/2015   ALT 15 03/22/2015   ALBUMIN 4.4 03/22/2015   Electrolytes Lab Results    Component Value Date   NA 140 03/22/2015   K 3.9 03/22/2015   CL 105 03/22/2015   CALCIUM 9.9 03/22/2015   Pain Modulating Vitamins No results found for: Maralyn Sago [id number], [id number], [id number], 25OHVITD1, 25OHVITD2, 25OHVITD3, VITAMINB12 Coagulation Parameters Lab Results  Component Value Date   PLT 248.0 03/22/2015   Cardiovascular Lab Results  Component Value Date   HGB 13.8 03/22/2015   HCT 40.6 03/22/2015   Note: Lab results reviewed.  Recent Diagnostic Imaging Review  No results found. Note: Imaging results reviewed.          Meds  The patient has a current medication list which includes the following prescription(s): acetaminophen, alprazolam, amoxicillin, omeprazole, and ranitidine, and the following Facility-Administered Medications: lidocaine (pf), ropivacaine (pf) 2 mg/ml (0.2%), and triamcinolone acetonide.  Current Outpatient Prescriptions on File Prior to Visit  Medication Sig  . acetaminophen (TYLENOL) 500 MG tablet Take 1,000 mg by mouth every 6 (six) hours as needed for moderate pain.  Marland Kitchen ALPRAZolam (XANAX) 0.25 MG tablet TAKE ONE TABLET BY MOUTH AT BEDTIME AS NEEDED FOR ANXIETY  . amoxicillin (AMOXIL) 500 MG capsule Take four tablets by mouth one hour prior to dental cleanings  . omeprazole (PRILOSEC) 40 MG capsule Take 40 mg by mouth daily.  . ranitidine (ZANTAC) 150 MG tablet Take 150 mg by mouth as needed.    No current facility-administered medications on file prior to visit.    ROS  Constitutional: Denies any fever or chills Gastrointestinal: No reported hemesis, hematochezia, vomiting, or acute GI distress Musculoskeletal: Denies any acute onset joint swelling, redness, loss of ROM, or weakness Neurological: No reported episodes of acute onset apraxia, aphasia, dysarthria, agnosia, amnesia, paralysis, loss of coordination, or loss of consciousness  Allergies  Shannon [name] is allergic to codeine; decongest-aid [pseudoephedrine]; flagyl  [metronidazole]; and  sulfa antibiotics.  [name]  Drug: Shannon [name]  reports that she does not use drugs. Alcohol:  reports that she does not drink alcohol. Tobacco:  reports that she has quit smoking. She has never used smokeless tobacco. Medical:  has a past medical history of Anemia; Breast pain, right (11/07/2012); Breathing difficulty (06/20/2014); Environmental allergies; GERD (gastroesophageal reflux disease); Hiatal hernia; History of migraine headaches; Hyperthyroidism; and Vaginitis (01/16/2013). Family: family history includes Breast cancer (age of onset: 25) in her mother; Heart disease in her father and maternal grandfather; Hypercholesterolemia in her father and mother; Hypertension in her father; Kidney cancer in her father; Leukemia in her father; Lung cancer in her paternal grandfather; Pancreatic cancer in her paternal grandfather; Prostate cancer in her paternal grandfather; Thyroid disease in her father and mother.  Past Surgical History:  Procedure Laterality Date  . CESAREAN SECTION  11/98   Dr Roena Malady  . CHOLECYSTECTOMY  11/05   Dr Pat Patrick  . JOINT REPLACEMENT Left 2017  . NASAL SINUS SURGERY  11/93  . SEPTOPLASTY  5/90   Dr Rossie Muskrat  . TUBAL LIGATION  02/1998  . UMBILICAL HERNIA REPAIR  02/1998   Constitutional Exam  General appearance: Well nourished, well developed, and well hydrated. In no apparent acute distress Vitals:   03/03/16 1314 03/03/16 1349  BP: (!) 94/57 (!) 145/95  Pulse: 81 72  Resp: 18 16  Temp: 98.6 F (37 C)   SpO2: 100% 99%  Weight: 126 lb (57.2 kg)   Height: 5\' 2"  (1.575 m)    BMI Assessment: Estimated body mass index is 23.05 kg/m as calculated from the following:   Height as of this encounter: 5\' 2"  (1.575 m).   Weight as of this encounter: 126 lb (57.2 kg).  BMI interpretation table: BMI level Category Range association with higher incidence of chronic pain  <18 kg/m2 Underweight   18.5-24.9 kg/m2 Ideal body weight   25-29.9 kg/m2  Overweight Increased incidence by 20%  30-34.9 kg/m2 Obese (Class I) Increased incidence by 68%  35-39.9 kg/m2 Severe obesity (Class II) Increased incidence by 136%  >40 kg/m2 Extreme obesity (Class III) Increased incidence by 254%   BMI Readings from Last 4 Encounters:  03/03/16 23.05 kg/m  02/25/16 23.78 kg/m  01/14/16 22.86 kg/m  01/02/16 22.86 kg/m   Wt Readings from Last 4 Encounters:  03/03/16 126 lb (57.2 kg)  02/25/16 130 lb (59 kg)  01/14/16 125 lb (56.7 kg)  01/02/16 125 lb (56.7 kg)  Psych/Mental status: Alert, oriented x 3 (person, place, & time)       Eyes: PERLA Respiratory: No evidence of acute respiratory distress  Cervical Spine Exam  Inspection: No masses, redness, or swelling Alignment: Symmetrical Functional ROM: Unrestricted ROM Stability: No instability detected Muscle strength & Tone: Functionally intact Sensory: Unimpaired Palpation: Non-contributory  Upper Extremity (UE) Exam    Side: Right upper extremity  Side: Left upper extremity  Inspection: No masses, redness, swelling, or asymmetry. No contractures  Inspection: No masses, redness, swelling, or asymmetry. No contractures  Functional ROM: Unrestricted ROM          Functional ROM: Unrestricted ROM          Muscle strength & Tone: Functionally intact  Muscle strength & Tone: Functionally intact  Sensory: Unimpaired  Sensory: Unimpaired  Palpation: Euthermic  Palpation: Euthermic  Specialized Test(s): Deferred         Specialized Test(s): Deferred          Thoracic Spine Exam  Inspection: No masses, redness, or swelling Alignment: Symmetrical Functional ROM: Unrestricted ROM Stability: No instability detected Sensory: Unimpaired Muscle strength & Tone: Functionally intact Palpation: Non-contributory  Lumbar Spine Exam  Inspection: No masses, redness, or swelling Alignment: Symmetrical Functional ROM: Unrestricted ROM Stability: No instability detected Muscle strength & Tone:  Functionally intact Sensory: Unimpaired Palpation: Non-contributory Provocative Tests: Lumbar Hyperextension and rotation test: evaluation deferred today       Patrick's Maneuver: evaluation deferred today              Gait & Posture Assessment  Ambulation: Unassisted Gait: Relatively normal for age and body habitus Posture: WNL   Lower Extremity Exam    Side: Right lower extremity  Side: Left lower extremity  Inspection: No masses, redness, swelling, or asymmetry. No contractures  Inspection: No masses, redness, swelling, or asymmetry. No contractures  Functional ROM: Unrestricted ROM          Functional ROM: Unrestricted ROM          Muscle strength & Tone: Functionally intact  Muscle strength & Tone: Functionally intact  Sensory: Unimpaired  Sensory: Unimpaired  Palpation: Complains of area being tender to palpation over the right gluteal muscle   Palpation: No palpable anomalies   Procedure:  Anesthesia, Analgesia, Anxiolysis:  Type: Trigger Point Injection Level: Right PSIS Laterality: Right side Position: Prone Target Muscle: Gluteus maximus and medius Approach: Posterolateral Region: Posterolateral  Buttocks area. Primary Purpose: Diagnostic  Type: Local Anesthesia Local Anesthetic: Lidocaine 1% Route: Infiltration ([place]/IM) IV Access: Declined Sedation: Declined  Indication(s): Analgesia          Indications: 1. Myofascial pain syndrome (Right Gluteous Muscle)   2. Gluteal pain (Right)    Pain Score: Pre-procedure: 3 /10 Post-procedure: 0-No pain/10  Pre-op Assessment:  Previous date of service: 01/14/16 Service provided: Procedure (Right SIJI) Shannon [name] is a 51 y.o. (year old), female patient, seen today for interventional treatment. She  has a past surgical history that includes Septoplasty (5/90); Nasal sinus surgery (11/93); Cesarean section (11/98); Tubal ligation (02/1998); Umbilical hernia repair (02/1998); Cholecystectomy (11/05); and Joint replacement (Left,  2017). Her primarily concern today is the Back Pain (right lower back)  Initial Vital Signs: Blood pressure (!) 145/95, pulse 72, temperature 98.6 F (37 C), resp. rate 16, height 5\' 2"  (1.575 m), weight 126 lb (57.2 kg), last menstrual period 02/12/2013, SpO2 99 %. BMI: 23.05 kg/m  Risk Assessment: Allergies: Reviewed. She is allergic to codeine; decongest-aid [pseudoephedrine]; flagyl [metronidazole]; and sulfa antibiotics.  Allergy Precautions: None required Coagulopathies: "Reviewed. None identified.  Blood-thinner therapy: None at this time Active Infection(s): Reviewed. None identified. Shannon [name] is afebrile  Site Confirmation: Shannon [name] was asked to confirm the procedure and laterality before marking the site Procedure checklist: Completed Consent: Before the procedure and under the influence of no sedative(s), amnesic(s), or anxiolytics, the patient was informed of the treatment options, risks and possible complications. To fulfill our ethical and legal obligations, as recommended by the American Medical Association's Code of Ethics, I have informed the patient of my clinical impression; the nature and purpose of the treatment or procedure; the risks, benefits, and possible complications of the intervention; the alternatives, including doing nothing; the risk(s) and benefit(s) of the alternative treatment(s) or procedure(s); and the risk(s) and benefit(s) of doing nothing. The patient was provided information about the general risks and possible complications associated with the procedure. These may include, but are not limited to: failure to achieve desired goals, infection, bleeding, organ or  nerve damage, allergic reactions, paralysis, and death. In addition, the patient was informed of those risks and complications associated to the procedure, such as failure to decrease pain; infection; bleeding; organ or nerve damage with subsequent damage to sensory, motor, and/or autonomic systems,  resulting in permanent pain, numbness, and/or weakness of one or several areas of the body; allergic reactions; (i.e.: anaphylactic reaction); and/or death. Furthermore, the patient was informed of those risks and complications associated with the medications. These include, but are not limited to: allergic reactions (i.e.: anaphylactic or anaphylactoid reaction(s)); adrenal axis suppression; blood sugar elevation that in diabetics may result in ketoacidosis or comma; water retention that in patients with history of congestive heart failure may result in shortness of breath, pulmonary edema, and decompensation with resultant heart failure; weight gain; swelling or edema; medication-induced neural toxicity; particulate matter embolism and blood vessel occlusion with resultant organ, and/or nervous system infarction; and/or aseptic necrosis of one or more joints. Finally, the patient was informed that Medicine is not an exact science; therefore, there is also the possibility of unforeseen or unpredictable risks and/or possible complications that may result in a catastrophic outcome. The patient indicated having understood very clearly. We have given the patient no guarantees and we have made no promises. Enough time was given to the patient to ask questions, all of which were answered to the patient's satisfaction. Shannon [name] has indicated that she wanted to continue with the procedure. Attestation: I, the ordering provider, attest that I have discussed with the patient the benefits, risks, side-effects, alternatives, likelihood of achieving goals, and potential problems during recovery for the procedure that I have provided informed consent. Date: 03/03/2016; Time: 4:03 PM  Pre-Procedure Preparation:  Monitoring: As per clinic protocol. Respiration, ETCO2, SpO2, BP, heart rate and rhythm monitor placed and checked for adequate function Safety Precautions: Patient was assessed for positional comfort and pressure  points before starting the procedure. Time-out: I initiated and conducted the "Time-out" before starting the procedure, as per protocol. The patient was asked to participate by confirming the accuracy of the "Time Out" information. Verification of the correct person, site, and procedure were performed and confirmed by me, the nursing staff, and the patient. "Time-out" conducted as per Joint Commission's Universal Protocol (UP.01.01.01). "Time-out" Date & Time: 03/03/2016; 1344 hrs.  Description of Procedure Process:   Prepping solution: ChloraPrep (2% chlorhexidine gluconate and 70% isopropyl alcohol) Safety Precautions: Aspiration looking for blood return was conducted prior to all injections. At no point did we inject any substances, as a needle was being advanced. No attempts were made at seeking any paresthesias. Safe injection practices and needle disposal techniques used. Medications properly checked for expiration dates. SDV (single dose vial) medications used. Description of the Procedure: Protocol guidelines were followed. The patient was placed in position over the fluoroscopy table. The target area was identified and the area prepped in the usual manner. Skin desensitized using vapocoolant spray. Skin & deeper tissues infiltrated with local anesthetic. Appropriate amount of time allowed to pass for local anesthetics to take effect. The procedure needles were then advanced to the target area. Proper needle placement secured. Negative aspiration confirmed. Solution injected in intermittent fashion, asking for systemic symptoms every 0.5cc of injectate. The needles were then removed and the area cleansed, making sure to leave some of the prepping solution back to take advantage of its long term bactericidal properties. Start Time: 1345 hrs. End Time: 1346 hrs. Materials:  Needle(s) Type: Regular needle Gauge: 25G Length: 1.5-in Medication(s):  We administered lidocaine (PF), triamcinolone  acetonide, and ropivacaine (PF) 2 mg/mL (0.2%). Please see chart orders for dosing details.  Assessment  Primary Diagnosis & Pertinent Problem List: The primary encounter diagnosis was Myofascial pain syndrome (Right Gluteous Muscle). A diagnosis of Gluteal pain (Right) was also pertinent to this visit.  Status Diagnosis  This is a new problem  1. Myofascial pain syndrome (Right Gluteous Muscle)   2. Gluteal pain (Right)      Plan of Care  Pharmacotherapy (Medications Ordered): Meds ordered this encounter  Medications  . ropivacaine (PF) 2 mg/mL (0.2%) (NAROPIN) injection 5 mL  . lidocaine (PF) (XYLOCAINE) 1 % injection 5 mL  . triamcinolone acetonide (KENALOG-40) injection 40 mg  . lidocaine (PF) (XYLOCAINE) 1 % injection    Sharlett Iles, Delores: cabinet override  . triamcinolone acetonide (KENALOG-40) 40 MG/ML injection    Sharlett Iles, Delores: cabinet override  . ropivacaine (PF) 2 mg/mL (0.2%) (NAROPIN) 2 MG/ML injection    Sharlett Iles, Delores: cabinet override   New Prescriptions   No medications on file   Medications administered today: We administered lidocaine (PF), triamcinolone acetonide, and ropivacaine (PF) 2 mg/mL (0.2%). Lab-work, procedure(s), and/or referral(s): Orders Placed This Encounter  Procedures  . TRIGGER POINT INJECTION  . Discharge instructions  . Follow-up  . Informed Consent Details: Transcribe to consent form and obtain patient signature  . Provider attestation of informed consent for procedure/surgical case  . Verify informed consent   Imaging and/or referral(s): DISCHARGE INSTRUCTIONS FOLLOW UP INFORMED CONSENT DETAILS: TRANSCRIBE TO CONSENT FORM AND OBTAIN PATIENT SIGNATURE PROVIDER ATTESTATION OF INFORMED CONSENT PROCEDURE/SURGICAL CASE VERIFY INFORMED CONSENT  Interventional therapies: Planned, scheduled, and/or pending:   Not at this time.   Considering:   Diagnostic right-sided sacroiliac joint block under fluoroscopic guidance, no  sedation.  Palliative right-sided L4-5 lumbar epidural steroid injection under fluoroscopic guidance.  Palliative left-sided sacroiliac joint block under fluoroscopic guidance.    Palliative PRN treatment(s):  Palliative left-sided sacroiliac joint block under fluoroscopic guidance, no sedation. Palliative left-sided hip joint injection under fluoroscopic guidance, no sedation. Diagnostic bilateral lumbar facet block under fluoroscopic guidance and IV sedation. Palliative left-sided lumbar epidural steroid injection under fluoroscopic guidance, no sedation.    Provider-requested follow-up: Return in about 2 weeks (around 03/17/2016) for Post-Procedure evaluation.  Future Appointments Date Time Provider Wise  03/07/2016 9:00 AM [facility] None  03/19/2016 2:00 PM Milinda Pointer, MD [facility] None  08/25/2016 2:00 PM [name], MD [facility] None   Primary Care Physician: [name], MD Location: [facility] Note by: Kathlen Brunswick. Dossie Arbour, M.D, DABA, DABAPM, DABPM, DABIPP, FIPP Date: 03/03/2016; Time: 4:10 PM  Pain Score Disclaimer: We use the NRS-11 scale. This is a self-reported, subjective measurement of pain severity with only modest accuracy. It is used primarily to identify changes within a particular patient. It must be understood that outpatient pain scales are significantly less accurate that those used for research, where they can be applied under ideal controlled circumstances with minimal exposure to variables. In reality, the score is likely to be a combination of pain intensity and pain affect, where pain affect describes the degree of emotional arousal or changes in action readiness caused by the sensory experience of pain. Factors such as social and work situation, setting, emotional state, anxiety levels, expectation, and prior pain experience may influence pain perception and show large inter-individual differences that  may also be affected by time variables.  Patient instructions provided during this appointment: Patient Instructions  Trigger Point Injection Trigger points are areas where you have pain. A trigger point injection is a shot given in the trigger point to help relieve pain for a few days to a few months. Common places for trigger points include:  The neck.  The shoulders.  The upper back.  The lower back. A trigger point injection will not cure long-lasting (chronic) pain permanently. These injections do not always work for every person, but for some people they can help to relieve pain for a few days to a few months. Tell a health care provider about:  Any allergies you have.  All medicines you are taking, including vitamins, herbs, eye drops, creams, and over-the-counter medicines.  Any problems you or family members have had with anesthetic medicines.  Any blood disorders you have.  Any surgeries you have had.  Any medical conditions you have. What are the risks? Generally, this is a safe procedure. However, problems may occur, including:  Infection.  Bleeding.  Allergic reaction to the injected medicine.  Irritation of the skin around the injection site. What happens before the procedure?  Ask your health care provider about changing or stopping your regular medicines. This is especially important if you are taking diabetes medicines or blood thinners. What happens during the procedure?  Your health care provider will feel for trigger points. A marker may be used to circle the area for the injection.  The skin over the trigger point will be washed with a germ-killing (antiseptic) solution.  A thin needle is used for the shot. You may feel pain or a twitching feeling when the needle enters the trigger point.  A numbing solution may be injected into the trigger point. Sometimes a medicine to keep down swelling, redness, and warmth (inflammation) is also  injected.  Your health care provider may move the needle around the area where the trigger point is located until the tightness and twitching goes away.  After the injection, your health care provider may put gentle pressure over the injection site.  The injection site will be covered with a bandage (dressing). The procedure may vary among health care providers and hospitals. What happens after the procedure?  The dressing can be taken off in a few hours or as told by your health care provider.  You may feel sore and stiff for 1-2 days. This information is not intended to replace advice given to you by your health care provider. Make sure you discuss any questions you have with your health care provider. Document Released: 01/02/2011 Document Revised: 09/16/2015 Document Reviewed: 07/03/2014 Elsevier Interactive Patient Education  2017 [place]  What are the risk, side effects and possible complications? Generally speaking, most procedures are safe.  However, with any procedure there are risks, side effects, and the possibility of complications.  The risks and complications are dependent upon the sites that are lesioned, or the type of nerve block to be performed.  The closer the procedure is to the spine, the more serious the risks are.  Great care is taken when placing the radio frequency needles, block needles or lesioning probes, but sometimes complications can occur. 1. Infection: Any time there is an injection through the skin, there is a risk of infection.  This is why sterile conditions are used for these blocks.  There are four possible types of infection. 1. Localized skin infection. 2. Central Nervous System Infection-This can be in the form of Meningitis, which can be deadly. 3. Epidural  Infections-This can be in the form of an epidural abscess, which can cause pressure inside of the spine, causing compression of the spinal cord with subsequent  paralysis. This would require an emergency surgery to decompress, and there are no guarantees that the patient would recover from the paralysis. 4. Discitis-This is an infection of the intervertebral discs.  It occurs in about 1% of discography procedures.  It is difficult to treat and it may lead to surgery.        2. Pain: the needles have to go through skin and soft tissues, will cause soreness.       3. Damage to internal structures:  The nerves to be lesioned may be near blood vessels or    other nerves which can be potentially damaged.       4. Bleeding: Bleeding is more common if the patient is taking blood thinners such as  aspirin, Coumadin, Ticiid, Plavix, etc., or if he/she have some genetic predisposition  such as hemophilia. Bleeding into the spinal canal can cause compression of the spinal  cord with subsequent paralysis.  This would require an emergency surgery to  decompress and there are no guarantees that the patient would recover from the  paralysis.       5. Pneumothorax:  Puncturing of a lung is a possibility, every time a needle is introduced in  the area of the chest or upper back.  Pneumothorax refers to free air around the  collapsed lung(s), inside of the thoracic cavity (chest cavity).  Another two possible  complications related to a similar event would include: Hemothorax and Chylothorax.   These are variations of the Pneumothorax, where instead of air around the collapsed  lung(s), you may have blood or chyle, respectively.       6. Spinal headaches: They may occur with any procedures in the area of the spine.       7. Persistent CSF (Cerebro-Spinal Fluid) leakage: This is a rare problem, but may occur  with prolonged intrathecal or epidural catheters either due to the formation of a fistulous  track or a dural tear.       8. Nerve damage: By working so close to the spinal cord, there is always a possibility of  nerve damage, which could be as serious as a permanent spinal  cord injury with  paralysis.       9. Death:  Although rare, severe deadly allergic reactions known as "Anaphylactic  reaction" can occur to any of the medications used.      10. Worsening of the symptoms:  We can always make thing worse.  What are the chances of something like this happening? Chances of any of this occuring are extremely low.  By statistics, you have more of a chance of getting killed in a motor vehicle accident: while driving to the hospital than any of the above occurring .  Nevertheless, you should be aware that they are possibilities.  In general, it is similar to taking a shower.  Everybody knows that you can slip, hit your head and get killed.  Does that mean that you should not shower again?  Nevertheless always keep in mind that statistics do not mean anything if you happen to be on the wrong side of them.  Even if a procedure has a 1 (one) in a 1,000,000 (million) chance of going wrong, it you happen to be that one..Also, keep in mind that by statistics, you have more of a chance  of having something go wrong when taking medications.  Who should not have this procedure? If you are on a blood thinning medication (e.g. Coumadin, Plavix, see list of "Blood Thinners"), or if you have an active infection going on, you should not have the procedure.  If you are taking any blood thinners, please inform your physician.  How should I prepare for this procedure?  Do not eat or drink anything at least six hours prior to the procedure.  Bring a driver with you .  It cannot be a taxi.  Come accompanied by an adult that can drive you back, and that is strong enough to help you if your legs get weak or numb from the local anesthetic.  Take all of your medicines the morning of the procedure with just enough water to swallow them.  If you have diabetes, make sure that you are scheduled to have your procedure done first thing in the morning, whenever possible.  If you have diabetes,  take only half of your insulin dose and notify our nurse that you have done so as soon as you arrive at the clinic.  If you are diabetic, but only take blood sugar pills (oral hypoglycemic), then do not take them on the morning of your procedure.  You may take them after you have had the procedure.  Do not take aspirin or any aspirin-containing medications, at least eleven (11) days prior to the procedure.  They may prolong bleeding.  Wear loose fitting clothing that may be easy to take off and that you would not mind if it got stained with Betadine or blood.  Do not wear any jewelry or perfume  Remove any nail coloring.  It will interfere with some of our monitoring equipment.  NOTE: Remember that this is not meant to be interpreted as a complete list of all possible complications.  Unforeseen problems may occur.  BLOOD THINNERS The following drugs contain aspirin or other products, which can cause increased bleeding during surgery and should not be taken for 2 weeks prior to and 1 week after surgery.  If you should need take something for relief of minor pain, you may take acetaminophen which is found in Tylenol,m Datril, Anacin-3 and Panadol. It is not blood thinner. The products listed below are.  Do not take any of the products listed below in addition to any listed on your instruction sheet.  A.P.C or A.P.C with Codeine Codeine Phosphate Capsules #3 Ibuprofen Ridaura  ABC compound Congesprin Imuran rimadil  Advil Cope Indocin Robaxisal  Alka-Seltzer Effervescent Pain Reliever and Antacid Coricidin or Coricidin-D  Indomethacin Rufen  Alka-Seltzer plus Cold Medicine Cosprin Ketoprofen S-A-C Tablets  Anacin Analgesic Tablets or Capsules Coumadin Korlgesic Salflex  Anacin Extra Strength Analgesic tablets or capsules CP-2 Tablets Lanoril Salicylate  Anaprox Cuprimine Capsules Levenox Salocol  Anexsia-D Dalteparin Magan Salsalate  Anodynos Darvon compound Magnesium Salicylate Sine-off   Ansaid Dasin Capsules Magsal Sodium Salicylate  Anturane Depen Capsules Marnal Soma  APF Arthritis pain formula Dewitt's Pills Measurin Stanback  Argesic Dia-Gesic Meclofenamic Sulfinpyrazone  Arthritis Bayer Timed Release Aspirin Diclofenac Meclomen Sulindac  Arthritis pain formula Anacin Dicumarol Medipren Supac  Analgesic (Safety coated) Arthralgen Diffunasal Mefanamic Suprofen  Arthritis Strength Bufferin Dihydrocodeine Mepro Compound Suprol  Arthropan liquid Dopirydamole Methcarbomol with Aspirin Synalgos  ASA tablets/Enseals Disalcid Micrainin Tagament  Ascriptin Doan's Midol Talwin  Ascriptin A/D Dolene Mobidin Tanderil  Ascriptin Extra Strength Dolobid Moblgesic Ticlid  Ascriptin with Codeine Doloprin or Doloprin with Codeine Momentum  Tolectin  Asperbuf Duoprin Mono-gesic Trendar  Aspergum Duradyne Motrin or Motrin IB Triminicin  Aspirin plain, buffered or enteric coated Durasal Myochrisine Trigesic  Aspirin Suppositories Easprin Nalfon Trillsate  Aspirin with Codeine Ecotrin Regular or Extra Strength Naprosyn Uracel  Atromid-S Efficin Naproxen Ursinus  Auranofin Capsules Elmiron Neocylate Vanquish  Axotal Emagrin Norgesic Verin  Azathioprine Empirin or Empirin with Codeine Normiflo Vitamin E  Azolid Emprazil Nuprin Voltaren  Bayer Aspirin plain, buffered or children's or timed BC Tablets or powders Encaprin Orgaran Warfarin Sodium  Buff-a-Comp Enoxaparin Orudis Zorpin  Buff-a-Comp with Codeine Equegesic Os-Cal-Gesic   Buffaprin Excedrin plain, buffered or Extra Strength Oxalid   Bufferin Arthritis Strength Feldene Oxphenbutazone   Bufferin plain or Extra Strength Feldene Capsules Oxycodone with Aspirin   Bufferin with Codeine Fenoprofen Fenoprofen Pabalate or Pabalate-SF   Buffets II Flogesic Panagesic   Buffinol plain or Extra Strength Florinal or Florinal with Codeine Panwarfarin   Buf-Tabs Flurbiprofen Penicillamine   Butalbital Compound Four-way cold tablets  Penicillin   Butazolidin Fragmin Pepto-Bismol   Carbenicillin Geminisyn Percodan   Carna Arthritis Reliever Geopen Persantine   Carprofen Gold's salt Persistin   Chloramphenicol Goody's Phenylbutazone   Chloromycetin Haltrain Piroxlcam   Clmetidine heparin Plaquenil   Cllnoril Hyco-pap Ponstel   Clofibrate Hydroxy chloroquine Propoxyphen         Before stopping any of these medications, be sure to consult the physician who ordered them.  Some, such as Coumadin (Warfarin) are ordered to prevent or treat serious conditions such as "deep thrombosis", "pumonary embolisms", and other heart problems.  The amount of time that you may need off of the medication may also vary with the medication and the reason for which you were taking it.  If you are taking any of these medications, please make sure you notify your pain physician before you undergo any procedures.

## 2016-03-03 NOTE — Progress Notes (Signed)
Safety precautions to be maintained throughout the outpatient stay will include: orient to surroundings, keep bed in low position, maintain call bell within reach at all times, provide assistance with transfer out of bed and ambulation.  

## 2016-03-04 DIAGNOSIS — J3089 Other allergic rhinitis: Secondary | ICD-10-CM | POA: Diagnosis not present

## 2016-03-04 DIAGNOSIS — J301 Allergic rhinitis due to pollen: Secondary | ICD-10-CM | POA: Diagnosis not present

## 2016-03-07 ENCOUNTER — Other Ambulatory Visit (INDEPENDENT_AMBULATORY_CARE_PROVIDER_SITE_OTHER): Payer: 59

## 2016-03-07 DIAGNOSIS — Z13220 Encounter for screening for lipoid disorders: Secondary | ICD-10-CM | POA: Diagnosis not present

## 2016-03-07 DIAGNOSIS — E0590 Thyrotoxicosis, unspecified without thyrotoxic crisis or storm: Secondary | ICD-10-CM | POA: Diagnosis not present

## 2016-03-07 LAB — CBC WITH DIFFERENTIAL/PLATELET
BASOS ABS: 0 10*3/uL (ref 0.0–0.1)
Basophils Relative: 0.2 % (ref 0.0–3.0)
Eosinophils Absolute: 0 10*3/uL (ref 0.0–0.7)
Eosinophils Relative: 0.1 % (ref 0.0–5.0)
HEMATOCRIT: 41.7 % (ref 36.0–46.0)
Hemoglobin: 13.9 g/dL (ref 12.0–15.0)
LYMPHS PCT: 21.5 % (ref 12.0–46.0)
Lymphs Abs: 1.7 10*3/uL (ref 0.7–4.0)
MCHC: 33.2 g/dL (ref 30.0–36.0)
MCV: 90.1 fl (ref 78.0–100.0)
MONOS PCT: 6.3 % (ref 3.0–12.0)
Monocytes Absolute: 0.5 10*3/uL (ref 0.1–1.0)
NEUTROS ABS: 5.7 10*3/uL (ref 1.4–7.7)
Neutrophils Relative %: 71.9 % (ref 43.0–77.0)
PLATELETS: 274 10*3/uL (ref 150.0–400.0)
RBC: 4.63 Mil/uL (ref 3.87–5.11)
RDW: 14.2 % (ref 11.5–15.5)
WBC: 8 10*3/uL (ref 4.0–10.5)

## 2016-03-07 LAB — COMPREHENSIVE METABOLIC PANEL
ALBUMIN: 4.7 g/dL (ref 3.5–5.2)
ALK PHOS: 61 U/L (ref 39–117)
ALT: 26 U/L (ref 0–35)
AST: 17 U/L (ref 0–37)
BILIRUBIN TOTAL: 0.5 mg/dL (ref 0.2–1.2)
BUN: 17 mg/dL (ref 6–23)
CALCIUM: 9.9 mg/dL (ref 8.4–10.5)
CO2: 28 mEq/L (ref 19–32)
Chloride: 104 mEq/L (ref 96–112)
Creatinine, Ser: 0.72 mg/dL (ref 0.40–1.20)
GFR: 90.24 mL/min (ref >60.00)
Glucose, Bld: 96 mg/dL (ref 70–99)
POTASSIUM: 3.7 meq/L (ref 3.5–5.1)
Sodium: 140 mEq/L (ref 135–145)
TOTAL PROTEIN: 7.4 g/dL (ref 6.0–8.3)

## 2016-03-07 LAB — LIPID PANEL
CHOL/HDL RATIO: 3
Cholesterol: 211 mg/dL — ABNORMAL HIGH (ref 0–200)
HDL: 80.1 mg/dL (ref >39.00)
LDL Cholesterol: 103 mg/dL — ABNORMAL HIGH (ref 0–99)
NonHDL: 130.97
TRIGLYCERIDES: 142 mg/dL (ref 0.0–149.0)
VLDL: 28.4 mg/dL (ref 0.0–40.0)

## 2016-03-07 LAB — TSH: TSH: 1.8 u[IU]/mL (ref 0.35–4.50)

## 2016-03-10 ENCOUNTER — Encounter: Payer: Self-pay | Admitting: Internal Medicine

## 2016-03-11 DIAGNOSIS — J301 Allergic rhinitis due to pollen: Secondary | ICD-10-CM | POA: Diagnosis not present

## 2016-03-11 DIAGNOSIS — J3089 Other allergic rhinitis: Secondary | ICD-10-CM | POA: Diagnosis not present

## 2016-03-18 DIAGNOSIS — J301 Allergic rhinitis due to pollen: Secondary | ICD-10-CM | POA: Diagnosis not present

## 2016-03-18 DIAGNOSIS — J3089 Other allergic rhinitis: Secondary | ICD-10-CM | POA: Diagnosis not present

## 2016-03-19 ENCOUNTER — Ambulatory Visit: Payer: 59 | Attending: Pain Medicine | Admitting: Pain Medicine

## 2016-03-19 ENCOUNTER — Encounter: Payer: Self-pay | Admitting: Pain Medicine

## 2016-03-19 VITALS — BP 120/81 | HR 86 | Temp 98.2°F | Resp 16 | Ht 62.0 in | Wt 126.0 lb

## 2016-03-19 DIAGNOSIS — R0789 Other chest pain: Secondary | ICD-10-CM | POA: Insufficient documentation

## 2016-03-19 DIAGNOSIS — Z87891 Personal history of nicotine dependence: Secondary | ICD-10-CM | POA: Insufficient documentation

## 2016-03-19 DIAGNOSIS — Z96642 Presence of left artificial hip joint: Secondary | ICD-10-CM | POA: Diagnosis not present

## 2016-03-19 DIAGNOSIS — M791 Myalgia: Secondary | ICD-10-CM | POA: Diagnosis not present

## 2016-03-19 DIAGNOSIS — Z885 Allergy status to narcotic agent status: Secondary | ICD-10-CM | POA: Insufficient documentation

## 2016-03-19 DIAGNOSIS — M1612 Unilateral primary osteoarthritis, left hip: Secondary | ICD-10-CM | POA: Diagnosis not present

## 2016-03-19 DIAGNOSIS — M533 Sacrococcygeal disorders, not elsewhere classified: Secondary | ICD-10-CM | POA: Diagnosis not present

## 2016-03-19 DIAGNOSIS — M47818 Spondylosis without myelopathy or radiculopathy, sacral and sacrococcygeal region: Secondary | ICD-10-CM | POA: Diagnosis not present

## 2016-03-19 DIAGNOSIS — M48061 Spinal stenosis, lumbar region without neurogenic claudication: Secondary | ICD-10-CM | POA: Diagnosis not present

## 2016-03-19 DIAGNOSIS — E0590 Thyrotoxicosis, unspecified without thyrotoxic crisis or storm: Secondary | ICD-10-CM | POA: Diagnosis not present

## 2016-03-19 DIAGNOSIS — Z882 Allergy status to sulfonamides status: Secondary | ICD-10-CM | POA: Diagnosis not present

## 2016-03-19 DIAGNOSIS — M461 Sacroiliitis, not elsewhere classified: Secondary | ICD-10-CM

## 2016-03-19 DIAGNOSIS — K219 Gastro-esophageal reflux disease without esophagitis: Secondary | ICD-10-CM | POA: Insufficient documentation

## 2016-03-19 DIAGNOSIS — G8929 Other chronic pain: Secondary | ICD-10-CM | POA: Diagnosis not present

## 2016-03-19 DIAGNOSIS — N950 Postmenopausal bleeding: Secondary | ICD-10-CM | POA: Insufficient documentation

## 2016-03-19 DIAGNOSIS — Z888 Allergy status to other drugs, medicaments and biological substances status: Secondary | ICD-10-CM | POA: Insufficient documentation

## 2016-03-19 DIAGNOSIS — G894 Chronic pain syndrome: Secondary | ICD-10-CM | POA: Diagnosis not present

## 2016-03-19 DIAGNOSIS — D649 Anemia, unspecified: Secondary | ICD-10-CM | POA: Diagnosis not present

## 2016-03-19 DIAGNOSIS — F419 Anxiety disorder, unspecified: Secondary | ICD-10-CM | POA: Insufficient documentation

## 2016-03-19 NOTE — Progress Notes (Signed)
Safety precautions to be maintained throughout the outpatient stay will include: orient to surroundings, keep bed in low position, maintain call bell within reach at all times, provide assistance with transfer out of bed and ambulation.  

## 2016-03-19 NOTE — Progress Notes (Signed)
Patient's Name: Shannon [name]  MRN: [id number]  Referring Provider: [name], MD  DOB: 1965-10-06  PCP: [name], MD  DOS: 03/19/2016  Note by: Kathlen Brunswick. Dossie Arbour, MD  Service setting: Ambulatory outpatient  Specialty: Interventional Pain Management  Location: [facility] (AMB) Pain Management Facility    Patient type: Established   Primary Reason(s) for Visit: Encounter for post-procedure evaluation of chronic illness with mild to moderate exacerbation CC: Back Pain (right, lower)  HPI  Shannon [name] is a 51 y.o. year old, female patient, who comes today for a post-procedure evaluation. She has Hyperthyroidism; GERD (gastroesophageal reflux disease); Anemia; Migraine; Menopausal symptoms; Health care maintenance; Anxiety; Atypical chest pain; Abdominal pain, epigastric; Gastro-esophageal reflux disease without esophagitis; Chronic low back pain (Left); Lumbar lateral recess stenosis (L4-5) (Left); Lumbar facet hypertrophy (L4-5) (Left); Chronic lumbar radicular pain (Location of Tertiary source of pain) (intermittent) (L4 Dermatome) (Left); Chronic hip pain (Location of Primary Source of Pain) (intermittent) (Left); Chronic sacroiliac joint pain (Location of Secondary source of pain) (intermittent) (Left); Nicotine dependence; Tobacco abuse; Osteoarthritis of hip (bone-on-bone) (Left); Lumbar spondylosis; Encounter for chronic pain management; Post-menopausal bleeding; Axillary lump; Muscle ache; Hemorrhage, postmenopausal; Osteoarthritis of sacroiliac joint (Bilateral) (L>R); Eructation; History of left hip replacement; Primary osteoarthritis of left hip; Pain of right sacroiliac joint; Chronic sacroiliac joint pain (Right); Myofascial pain syndrome (Right Gluteous Muscle); and Gluteal pain (Right) on her problem list. Her primarily concern today is the Back Pain (right, lower)  Pain Assessment: Self-Reported Pain Score: 1 /10             Reported level is compatible with observation.       Pain  Type: Chronic pain Pain Location: Back Pain Orientation: Right, Lower Pain Descriptors / Indicators: Aching Pain Frequency: Intermittent  Shannon [name] comes in today for post-procedure evaluation after the treatment done on 03/03/2016.  Further details on both, my assessment(s), as well as the proposed treatment plan, please see below.  Post-Procedure Assessment  03/03/2016 Procedure: Diagnostic right-sided sacroiliac joint block under fluoroscopic guidance, no sedation.  Post-procedure pain score: 0/10 (100% relief) Influential Factors: BMI: 23.05 kg/m Intra-procedural challenges: None observed Assessment challenges: None detected         Post-procedural side-effects, adverse reactions, or complications: None reported Reported issues: None  Sedation: No sedation used. When no sedatives are used, the analgesic levels obtained are directly associated to the effectiveness of the local anesthetics. However, when sedation is provided, the level of analgesia obtained during the initial 1 hour following the intervention, is believed to be the result of a combination of factors. These factors may include, but are not limited to: 1. The effectiveness of the local anesthetics used. 2. The effects of the analgesic(s) and/or anxiolytic(s) used. 3. The degree of discomfort experienced by the patient at the time of the procedure. 4. The patients ability and reliability in recalling and recording the events. 5. The presence and influence of possible secondary gains and/or psychosocial factors. Reported result: Relief experienced during the 1st hour after the procedure: 100 % (Ultra-Short Term Relief) Interpretative annotation: Analgesia during this period is likely to be Local Anesthetic and/or IV Sedative (Analgesic/Anxiolitic) related.          Effects of local anesthetic: The analgesic effects attained during this period are directly associated to the localized infiltration of local anesthetics and  therefore cary significant diagnostic value as to the etiological location, or anatomical origin, of the pain. Expected duration of relief is directly dependent on the pharmacodynamics  of the local anesthetic used. Long-acting (4-6 hours) anesthetics used.  Reported result: Relief during the next 4 to 6 hour after the procedure: 100 % (Short-Term Relief) Interpretative annotation: Complete relief would suggest area to be the source of the pain.          Long-term benefit: Defined as the period of time past the expected duration of local anesthetics. With the possible exception of prolonged sympathetic blockade from the local anesthetics, benefits during this period are typically attributed to, or associated with, other factors such as analgesic sensory neuropraxia, antiinflammatory effects, or beneficial biochemical changes provided by agents other than the local anesthetics Reported result: Extended relief following procedure: 100 % (lasted 10 days) (Long-Term Relief) Interpretative annotation: Good relief. This could suggest inflammation to be a significant component in the etiology to the pain.          Current benefits: Defined as persistent relief that continues at this point in time.   Reported results: Treated area: >50 % Ms. Heitner reports improvement in function Interpretative annotation: Ongoing benefits would suggest effective therapeutic approach  Interpretation: Results would suggest a successful diagnostic intervention.          Laboratory Chemistry  Inflammation Markers Lab Results  Component Value Date   ESRSEDRATE 8 05/22/2015   Renal Function Lab Results  Component Value Date   BUN 17 03/07/2016   CREATININE 0.72 03/07/2016   GFRAA >60 05/02/2014   GFRNONAA >60 05/02/2014   Hepatic Function Lab Results  Component Value Date   AST 17 03/07/2016   ALT 26 03/07/2016   ALBUMIN 4.7 03/07/2016   Electrolytes Lab Results  Component Value Date   NA 140 03/07/2016   K  3.7 03/07/2016   CL 104 03/07/2016   CALCIUM 9.9 03/07/2016   Pain Modulating Vitamins No results found for: Maralyn Sago [id number], [id number], [id number], 25OHVITD1, 25OHVITD2, 25OHVITD3, VITAMINB12 Coagulation Parameters Lab Results  Component Value Date   PLT 274.0 03/07/2016   Cardiovascular Lab Results  Component Value Date   HGB 13.9 03/07/2016   HCT 41.7 03/07/2016   Note: Lab results reviewed.  Recent Diagnostic Imaging Review  No results found. Note: Imaging results reviewed.          Meds  The patient has a current medication list which includes the following prescription(s): acetaminophen, alprazolam, omeprazole, and ranitidine.  Current Outpatient Prescriptions on File Prior to Visit  Medication Sig  . acetaminophen (TYLENOL) 500 MG tablet Take 1,000 mg by mouth every 6 (six) hours as needed for moderate pain.  Marland Kitchen ALPRAZolam (XANAX) 0.25 MG tablet TAKE ONE TABLET BY MOUTH AT BEDTIME AS NEEDED FOR ANXIETY  . omeprazole (PRILOSEC) 40 MG capsule Take 40 mg by mouth daily.  . ranitidine (ZANTAC) 150 MG tablet Take 150 mg by mouth as needed.    No current facility-administered medications on file prior to visit.    ROS  Constitutional: Denies any fever or chills Gastrointestinal: No reported hemesis, hematochezia, vomiting, or acute GI distress Musculoskeletal: Denies any acute onset joint swelling, redness, loss of ROM, or weakness Neurological: No reported episodes of acute onset apraxia, aphasia, dysarthria, agnosia, amnesia, paralysis, loss of coordination, or loss of consciousness  Allergies  Ms. Seelinger is allergic to codeine; decongest-aid [pseudoephedrine]; flagyl [metronidazole]; and sulfa antibiotics.  [place]  Drug: Ms. Levine  reports that she does not use drugs. Alcohol:  reports that she does not drink alcohol. Tobacco:  reports that she has quit smoking. She has never used smokeless tobacco. Medical:  has a past medical history of Anemia; Breast pain, right  (11/07/2012); Breathing difficulty (06/20/2014); Environmental allergies; GERD (gastroesophageal reflux disease); Hiatal hernia; History of migraine headaches; Hyperthyroidism; and Vaginitis (01/16/2013). Family: family history includes Breast cancer (age of onset: 41) in her mother; Heart disease in her father and maternal grandfather; Hypercholesterolemia in her father and mother; Hypertension in her father; Kidney cancer in her father; Leukemia in her father; Lung cancer in her paternal grandfather; Pancreatic cancer in her paternal grandfather; Prostate cancer in her paternal grandfather; Thyroid disease in her father and mother.  Past Surgical History:  Procedure Laterality Date  . CESAREAN SECTION  11/98   Dr Roena Malady  . CHOLECYSTECTOMY  11/05   Dr Pat Patrick  . JOINT REPLACEMENT Left 2017  . NASAL SINUS SURGERY  11/93  . SEPTOPLASTY  5/90   Dr Rossie Muskrat  . TUBAL LIGATION  02/1998  . UMBILICAL HERNIA REPAIR  02/1998   Constitutional Exam  General appearance: Well nourished, well developed, and well hydrated. In no apparent acute distress Vitals:   03/19/16 1347  BP: 120/81  Pulse: 86  Resp: 16  Temp: 98.2 F (36.8 C)  TempSrc: Oral  SpO2: 99%  Weight: 126 lb (57.2 kg)  Height: 5\' 2"  (1.575 m)   BMI Assessment: Estimated body mass index is 23.05 kg/m as calculated from the following:   Height as of this encounter: 5\' 2"  (1.575 m).   Weight as of this encounter: 126 lb (57.2 kg).  BMI interpretation table: BMI level Category Range association with higher incidence of chronic pain  <18 kg/m2 Underweight   18.5-24.9 kg/m2 Ideal body weight   25-29.9 kg/m2 Overweight Increased incidence by 20%  30-34.9 kg/m2 Obese (Class I) Increased incidence by 68%  35-39.9 kg/m2 Severe obesity (Class II) Increased incidence by 136%  >40 kg/m2 Extreme obesity (Class III) Increased incidence by 254%   BMI Readings from Last 4 Encounters:  03/19/16 23.05 kg/m  03/03/16 23.05 kg/m  02/25/16  23.78 kg/m  01/14/16 22.86 kg/m   Wt Readings from Last 4 Encounters:  03/19/16 126 lb (57.2 kg)  03/03/16 126 lb (57.2 kg)  02/25/16 130 lb (59 kg)  01/14/16 125 lb (56.7 kg)  Psych/Mental status: Alert, oriented x 3 (person, place, & time)       Eyes: PERLA Respiratory: No evidence of acute respiratory distress  Cervical Spine Exam  Inspection: No masses, redness, or swelling Alignment: Symmetrical Functional ROM: Unrestricted ROM Stability: No instability detected Muscle strength & Tone: Functionally intact Sensory: Unimpaired Palpation: Non-contributory  Upper Extremity (UE) Exam    Side: Right upper extremity  Side: Left upper extremity  Inspection: No masses, redness, swelling, or asymmetry. No contractures  Inspection: No masses, redness, swelling, or asymmetry. No contractures  Functional ROM: Unrestricted ROM          Functional ROM: Unrestricted ROM          Muscle strength & Tone: Functionally intact  Muscle strength & Tone: Functionally intact  Sensory: Unimpaired  Sensory: Unimpaired  Palpation: Euthermic  Palpation: Euthermic  Specialized Test(s): Deferred         Specialized Test(s): Deferred          Thoracic Spine Exam  Inspection: No masses, redness, or swelling Alignment: Symmetrical Functional ROM: Unrestricted ROM Stability: No instability detected Sensory: Unimpaired Muscle strength & Tone: Functionally intact Palpation: Non-contributory  Lumbar Spine Exam  Inspection: No masses, redness, or swelling Alignment: Symmetrical Functional ROM: Unrestricted ROM Stability: No instability detected Muscle  strength & Tone: Functionally intact Sensory: Unimpaired Palpation: Non-contributory Provocative Tests: Lumbar Hyperextension and rotation test: evaluation deferred today       Patrick's Maneuver: evaluation deferred today              Gait & Posture Assessment  Ambulation: Unassisted Gait: Relatively normal for age and body habitus Posture: WNL    Lower Extremity Exam    Side: Right lower extremity  Side: Left lower extremity  Inspection: No masses, redness, swelling, or asymmetry. No contractures  Inspection: No masses, redness, swelling, or asymmetry. No contractures  Functional ROM: Unrestricted ROM          Functional ROM: Unrestricted ROM          Muscle strength & Tone: Functionally intact  Muscle strength & Tone: Functionally intact  Sensory: Unimpaired  Sensory: Unimpaired  Palpation: No palpable anomalies  Palpation: No palpable anomalies   Assessment  Primary Diagnosis & Pertinent Problem List: The primary encounter diagnosis was Chronic sacroiliac joint pain (Right). A diagnosis of Osteoarthritis of sacroiliac joint (Bilateral) (L>R) was also pertinent to this visit.  Status Diagnosis  Controlled Controlled Controlled 1. Chronic sacroiliac joint pain (Right)   2. Osteoarthritis of sacroiliac joint (Bilateral) (L>R)      Plan of Care  Pharmacotherapy (Medications Ordered): No orders of the defined types were placed in this encounter.  New Prescriptions   No medications on file   Medications administered today: Ms. Bednarek had no medications administered during this visit. Lab-work, procedure(s), and/or referral(s): Orders Placed This Encounter  Procedures  . SACROILIAC JOINT INJECTINS   Imaging and/or referral(s): None  Interventional therapies: Planned, scheduled, and/or pending:   Not at this time.   Considering:   Diagnostic right-sided sacroiliac joint block under fluoroscopic guidance, no sedation. #2 Palliative right-sided L4-5 lumbar epidural steroid injection under fluoroscopic guidance.  Palliative left-sided sacroiliac joint block under fluoroscopic guidance.    Palliative PRN treatment(s):   Diagnostic right-sided sacroiliac joint block under fluoroscopic guidance, no sedation. #2 Palliative right-sided L4-5 lumbar epidural steroid injection under fluoroscopic guidance.  Palliative  left-sided sacroiliac joint block under fluoroscopic guidance.    Provider-requested follow-up: Return for (PRN) procedure.  Future Appointments Date Time Provider [place]  08/25/2016 2:00 PM [name], MD [facility] None   Primary Care Physician: [name], MD Location: [facility] Management Facility Note by: Kathlen Brunswick. Dossie Arbour, M.D, DABA, DABAPM, DABPM, DABIPP, FIPP Date: 03/19/2016; Time: 5:54 PM  Pain Score Disclaimer: We use the NRS-11 scale. This is a self-reported, subjective measurement of pain severity with only modest accuracy. It is used primarily to identify changes within a particular patient. It must be understood that outpatient pain scales are significantly less accurate that those used for research, where they can be applied under ideal controlled circumstances with minimal exposure to variables. In reality, the score is likely to be a combination of pain intensity and pain affect, where pain affect describes the degree of emotional arousal or changes in action readiness caused by the sensory experience of pain. Factors such as social and work situation, setting, emotional state, anxiety levels, expectation, and prior pain experience may influence pain perception and show large inter-individual differences that may also be affected by time variables.  Patient instructions provided during this appointment: There are no Patient Instructions on file for this visit.

## 2016-03-25 DIAGNOSIS — J301 Allergic rhinitis due to pollen: Secondary | ICD-10-CM | POA: Diagnosis not present

## 2016-03-25 DIAGNOSIS — J3089 Other allergic rhinitis: Secondary | ICD-10-CM | POA: Diagnosis not present

## 2016-04-03 DIAGNOSIS — J301 Allergic rhinitis due to pollen: Secondary | ICD-10-CM | POA: Diagnosis not present

## 2016-04-03 DIAGNOSIS — J3089 Other allergic rhinitis: Secondary | ICD-10-CM | POA: Diagnosis not present

## 2016-04-08 DIAGNOSIS — J3089 Other allergic rhinitis: Secondary | ICD-10-CM | POA: Diagnosis not present

## 2016-04-08 DIAGNOSIS — J301 Allergic rhinitis due to pollen: Secondary | ICD-10-CM | POA: Diagnosis not present

## 2016-04-22 DIAGNOSIS — J301 Allergic rhinitis due to pollen: Secondary | ICD-10-CM | POA: Diagnosis not present

## 2016-04-22 DIAGNOSIS — J3089 Other allergic rhinitis: Secondary | ICD-10-CM | POA: Diagnosis not present

## 2016-04-29 DIAGNOSIS — J301 Allergic rhinitis due to pollen: Secondary | ICD-10-CM | POA: Diagnosis not present

## 2016-04-29 DIAGNOSIS — J3089 Other allergic rhinitis: Secondary | ICD-10-CM | POA: Diagnosis not present

## 2016-05-06 DIAGNOSIS — J3089 Other allergic rhinitis: Secondary | ICD-10-CM | POA: Diagnosis not present

## 2016-05-06 DIAGNOSIS — J301 Allergic rhinitis due to pollen: Secondary | ICD-10-CM | POA: Diagnosis not present

## 2016-05-12 ENCOUNTER — Telehealth: Payer: Self-pay

## 2016-05-12 NOTE — Telephone Encounter (Signed)
Patient is having pain in her lower back, hip and sometimes going into the leg. It gets better when standing and walking but sitting and lying down is very painful.  She is unsure is she should have hip injection or SI injection, or come in for an evaluation.  She wants Dr. Dossie Arbour to make the call of what she needs to do.

## 2016-05-13 DIAGNOSIS — J301 Allergic rhinitis due to pollen: Secondary | ICD-10-CM | POA: Diagnosis not present

## 2016-05-13 DIAGNOSIS — J3089 Other allergic rhinitis: Secondary | ICD-10-CM | POA: Diagnosis not present

## 2016-05-13 NOTE — Telephone Encounter (Signed)
Please schedule patient for Right SIJI without sedation per patient complaint.  Pre procedure instructions given.  Please get Pre approval and schedule with patient.  Thank you

## 2016-05-14 NOTE — Telephone Encounter (Signed)
Sent request for prior authorization. Will call patient as soon as I receive it.

## 2016-05-22 DIAGNOSIS — J301 Allergic rhinitis due to pollen: Secondary | ICD-10-CM | POA: Diagnosis not present

## 2016-05-22 DIAGNOSIS — J3089 Other allergic rhinitis: Secondary | ICD-10-CM | POA: Diagnosis not present

## 2016-05-23 ENCOUNTER — Telehealth: Payer: Self-pay | Admitting: *Deleted

## 2016-05-23 NOTE — Telephone Encounter (Signed)
Patient called in today to ask about an existing UTI for which she is being treated and a scheduled procedure appt on Monday.  She is currently taking antibiotic for this and will have completed the therapy by Monday.  Patient instructed that I feel it will be fine to keep the appt as long as she continues to improve through the course of her therapy. Patient verbalizes u/o information.

## 2016-05-26 ENCOUNTER — Encounter: Payer: Self-pay | Admitting: Family

## 2016-05-26 ENCOUNTER — Encounter: Payer: Self-pay | Admitting: Internal Medicine

## 2016-05-26 ENCOUNTER — Ambulatory Visit (HOSPITAL_BASED_OUTPATIENT_CLINIC_OR_DEPARTMENT_OTHER): Payer: 59 | Admitting: Pain Medicine

## 2016-05-26 ENCOUNTER — Encounter: Payer: Self-pay | Admitting: Pain Medicine

## 2016-05-26 ENCOUNTER — Ambulatory Visit
Admission: RE | Admit: 2016-05-26 | Discharge: 2016-05-26 | Disposition: A | Discharge status: Home / Self Care | Payer: 59 | Source: Ambulatory Visit | Attending: Pain Medicine | Admitting: Pain Medicine

## 2016-05-26 ENCOUNTER — Ambulatory Visit (INDEPENDENT_AMBULATORY_CARE_PROVIDER_SITE_OTHER): Payer: 59 | Admitting: Family

## 2016-05-26 ENCOUNTER — Telehealth: Payer: Self-pay | Admitting: *Deleted

## 2016-05-26 VITALS — BP 145/85 | HR 65 | Temp 98.2°F | Resp 16 | Ht 62.0 in | Wt 126.0 lb

## 2016-05-26 VITALS — BP 128/86 | HR 85 | Temp 98.1°F | Ht 62.0 in | Wt 133.2 lb

## 2016-05-26 DIAGNOSIS — R300 Dysuria: Secondary | ICD-10-CM | POA: Diagnosis not present

## 2016-05-26 DIAGNOSIS — G8929 Other chronic pain: Secondary | ICD-10-CM | POA: Diagnosis not present

## 2016-05-26 DIAGNOSIS — Z882 Allergy status to sulfonamides status: Secondary | ICD-10-CM | POA: Insufficient documentation

## 2016-05-26 DIAGNOSIS — M533 Sacrococcygeal disorders, not elsewhere classified: Secondary | ICD-10-CM | POA: Diagnosis not present

## 2016-05-26 DIAGNOSIS — Z885 Allergy status to narcotic agent status: Secondary | ICD-10-CM | POA: Insufficient documentation

## 2016-05-26 DIAGNOSIS — M461 Sacroiliitis, not elsewhere classified: Secondary | ICD-10-CM

## 2016-05-26 DIAGNOSIS — Z888 Allergy status to other drugs, medicaments and biological substances status: Secondary | ICD-10-CM | POA: Diagnosis not present

## 2016-05-26 DIAGNOSIS — M47818 Spondylosis without myelopathy or radiculopathy, sacral and sacrococcygeal region: Secondary | ICD-10-CM

## 2016-05-26 DIAGNOSIS — M545 Low back pain: Secondary | ICD-10-CM | POA: Insufficient documentation

## 2016-05-26 DIAGNOSIS — M47898 Other spondylosis, sacral and sacrococcygeal region: Secondary | ICD-10-CM | POA: Diagnosis not present

## 2016-05-26 DIAGNOSIS — M5442 Lumbago with sciatica, left side: Secondary | ICD-10-CM

## 2016-05-26 DIAGNOSIS — J301 Allergic rhinitis due to pollen: Secondary | ICD-10-CM | POA: Diagnosis not present

## 2016-05-26 LAB — POCT URINALYSIS DIPSTICK
Bilirubin, UA: NEGATIVE
GLUCOSE UA: NEGATIVE
KETONES UA: NEGATIVE
Leukocytes, UA: NEGATIVE
Nitrite, UA: NEGATIVE
Protein, UA: NEGATIVE
SPEC GRAV UA: 1.015 (ref 1.010–1.025)
Urobilinogen, UA: 0.2 E.U./dL
pH, UA: 7 (ref 5.0–8.0)

## 2016-05-26 MED ORDER — METHYLPREDNISOLONE ACETATE 80 MG/ML IJ SUSP
80.0000 mg | Freq: Once | INTRAMUSCULAR | Status: AC
  Administered 2016-05-26: 80 mg via INTRA_ARTICULAR
  Filled 2016-05-26: qty 1

## 2016-05-26 MED ORDER — LIDOCAINE HCL (PF) 1 % IJ SOLN
10.0000 mL | Freq: Once | INTRAMUSCULAR | Status: AC
  Administered 2016-05-26: 5 mL
  Filled 2016-05-26: qty 10

## 2016-05-26 MED ORDER — ROPIVACAINE HCL 2 MG/ML IJ SOLN
4.0000 mL | Freq: Once | INTRAMUSCULAR | Status: AC
  Administered 2016-05-26: 10 mL via INTRA_ARTICULAR
  Filled 2016-05-26: qty 10

## 2016-05-26 NOTE — Patient Instructions (Addendum)
Post-Procedure instructions Instructions:  Apply ice: Fill a plastic sandwich bag with crushed ice. Cover it with a small towel and apply to injection site. Apply for 15 minutes then remove x 15 minutes. Repeat sequence on day of procedure, until you go to bed. The purpose is to minimize swelling and discomfort after procedure.  Apply heat: Apply heat to procedure site starting the day following the procedure. The purpose is to treat any soreness and discomfort from the procedure.  Food intake: Start with clear liquids (like water) and advance to regular food, as tolerated.   Physical activities: Keep activities to a minimum for the first 8 hours after the procedure.   Driving: If you have received any sedation, you are not allowed to drive for 24 hours after your procedure.  Blood thinner: Restart your blood thinner 6 hours after your procedure. (Only for those taking blood thinners)  Insulin: As soon as you can eat, you may resume your normal dosing schedule. (Only for those taking insulin)  Infection prevention: Keep procedure site clean and dry.  Post-procedure Pain Diary: Extremely important that this be done correctly and accurately. Recorded information will be used to determine the next step in treatment.  Pain evaluated is that of treated area only. Do not include pain from an untreated area.  Complete every hour, on the hour, for the initial 8 hours. Set an alarm to help you do this part accurately.  Do not go to sleep and have it completed later. It will not be accurate.  Follow-up appointment: Keep your follow-up appointment after the procedure. Usually 2 weeks for most procedures. (6 weeks in the case of radiofrequency.) Bring you pain diary.  Expect:  From numbing medicine (AKA: Local Anesthetics): Numbness or decrease in pain.  Onset: Full effect within 15 minutes of injected.  Duration: It will depend on the type of local anesthetic used. On the average, 1 to 8  hours.   From steroids: Decrease in swelling or inflammation. Once inflammation is improved, relief of the pain will follow.  Onset of benefits: Depends on the amount of swelling present. The more swelling, the longer it will take for the benefits to be seen.   Duration: Steroids will stay in the system x 2 weeks. Duration of benefits will depend on multiple posibilities including persistent irritating factors.  From procedure: Some discomfort is to be expected once the numbing medicine wears off. This should be minimal if ice and heat are applied as instructed. Call if:  You experience numbness and weakness that gets worse with time, as opposed to wearing off.  New onset bowel or bladder incontinence. (Spinal procedures only)  Emergency Numbers:  [place] hours (Monday - Thursday, 8:00 AM - 4:00 PM) (Friday, 9:00 AM - 12:00 Noon): [phone]  After hours: [phone] _____________________________________________________________________________________________  Sacroiliac (SI) Joint Injection Patient Information  Description: The sacroiliac joint connects the scrum (very low back and tailbone) to the ilium (a pelvic bone which also forms half of the hip joint).  Normally this joint experiences very little motion.  When this joint becomes inflamed or unstable low back and or hip and pelvis pain may result.  Injection of this joint with local anesthetics (numbing medicines) and steroids can provide diagnostic information and reduce pain.  This injection is performed with the aid of x-ray guidance into the tailbone area while you are lying on your stomach.   You may experience an electrical sensation down the leg while this is being  done.  You may also experience numbness.  We also may ask if we are reproducing your normal pain during the injection.  Conditions which may be treated SI injection:   Low back, buttock, hip or leg pain  Preparation for the Injection:  1. Do  not eat any solid food or dairy products within 8 hours of your appointment.  2. You may drink clear liquids up to 3 hours before appointment.  Clear liquids include water, black coffee, juice or soda.  No milk or cream please. 3. You may take your regular medications, including pain medications with a sip of water before your appointment.  Diabetics should hold regular insulin (if take separately) and take 1/2 normal NPH dose the morning of the procedure.  Carry some sugar containing items with you to your appointment. 4. A driver must accompany you and be prepared to drive you home after your procedure. 5. Bring all of your current medications with you. 6. An IV may be inserted and sedation may be given at the discretion of the physician. 7. A blood pressure cuff, EKG and other monitors will often be applied during the procedure.  Some patients may need to have extra oxygen administered for a short period.  8. You will be asked to provide medical information, including your allergies, prior to the procedure.  We must know immediately if you are taking blood thinners (like Coumadin/Warfarin) or if you are allergic to IV iodine contrast (dye).  We must know if you could possible be pregnant.  Possible side effects:   Bleeding from needle site  Infection (rare, may require surgery)  Nerve injury (rare)  Numbness & tingling (temporary)  A brief convulsion or seizure  Light-headedness (temporary)  Pain at injection site (several days)  Decreased blood pressure (temporary)  Weakness in the leg (temporary)   Call if you experience:   New onset weakness or numbness of an extremity below the injection site that last more than 8 hours.  Hives or difficulty breathing ( go to the emergency room)  Inflammation or drainage at the injection site  Any new symptoms which are concerning to you  Please note:  Although the local anesthetic injected can often make your back/ hip/ buttock/  leg feel good for several hours after the injections, the pain will likely return.  It takes 3-7 days for steroids to work in the sacroiliac area.  You may not notice any pain relief for at least that one week.  If effective, we will often do a series of three injections spaced 3-6 weeks apart to maximally decrease your pain.  After the initial series, we generally will wait some months before a repeat injection of the same type.  If you have any questions, please call [phone] [facility]  Pain Management Discharge Instructions  General Discharge Instructions :  If you need to reach your doctor call: Monday-Friday 8:00 am - 4:00 pm at [phone] or toll free [phone].  After clinic hours [phone] to have operator reach doctor.  Bring all of your medication bottles to all your appointments in the pain clinic.  To cancel or reschedule your appointment with Pain Management please remember to call 24 hours in advance to avoid a fee.  Refer to the educational materials which you have been given on: General Risks, I had my Procedure. Discharge Instructions, Post Sedation.  Post Procedure Instructions:  The drugs you were given will stay in your system until tomorrow, so  for the next 24 hours you should not drive, make any legal decisions or drink any alcoholic beverages.  You may eat anything you prefer, but it is better to start with liquids then soups and crackers, and gradually work up to solid foods.  Please notify your doctor immediately if you have any unusual bleeding, trouble breathing or pain that is not related to your normal pain.  Depending on the type of procedure that was done, some parts of your body may feel week and/or numb.  This usually clears up by tonight or the next day.  Walk with the use of an assistive device or accompanied by an adult for the 24 hours.  You may use ice on the affected area for the first 24 hours.   Put ice in a Ziploc bag and cover with a towel and place against area 15 minutes on 15 minutes off.  You may switch to heat after 24 hours.

## 2016-05-26 NOTE — Telephone Encounter (Signed)
FYI , patient has possible UTI that she has been treating herself with Cipro, but patient symptoms are improving with cipro that belonged to daughter.  I have scheduled patient for this afternoon at 4 :30 pm. Explained to patient that depending on if the bacteria  Is sensitive that ABX  Would decide the ABX used.

## 2016-05-26 NOTE — Progress Notes (Signed)
Safety precautions to be maintained throughout the outpatient stay will include: orient to surroundings, keep bed in low position, maintain call bell within reach at all times, provide assistance with transfer out of bed and ambulation.  

## 2016-05-26 NOTE — Progress Notes (Signed)
Subjective:    Patient ID: Shannon [name], female    DOB: 09-Nov-1965, 51 y.o.   MRN: [id number]  CC: Shannon [name] is a 51 y.o. female who presents today for an acute visit.    HPI: CC: urinary frequency, dysuria x 6 days, unchanged. Endorses suprapubic tenderness. Started taking daughters cipro without improvement. Tried at home kits which showed twice that she still had an infection.   No recent uti. No concern for stds. No h/o kidney stones.         HISTORY:  Past Medical History:  Diagnosis Date  . Anemia   . Breast pain, right 11/07/2012  . Breathing difficulty 06/20/2014  . Environmental allergies   . GERD (gastroesophageal reflux disease)   . Hiatal hernia    small  . History of migraine headaches   . Hyperthyroidism    s/p ablation  . Vaginitis 01/16/2013   Past Surgical History:  Procedure Laterality Date  . CESAREAN SECTION  11/98   Dr Roena Malady  . CHOLECYSTECTOMY  11/05   Dr Pat Patrick  . JOINT REPLACEMENT Left 2017  . NASAL SINUS SURGERY  11/93  . SEPTOPLASTY  5/90   Dr Rossie Muskrat  . TUBAL LIGATION  02/1998  . UMBILICAL HERNIA REPAIR  02/1998   Family History  Problem Relation Age of Onset  . Heart disease Father   . Hypertension Father   . Thyroid disease Father   . Hypercholesterolemia Father   . Kidney cancer Father   . Leukemia Father     hairy cell  . Hypercholesterolemia Mother   . Thyroid disease Mother   . Breast cancer Mother 103  . Breast cancer      maternal great grandmother  . Heart disease Maternal Grandfather     MI - age 61  . Prostate cancer Paternal Grandfather   . Pancreatic cancer Paternal Grandfather   . Lung cancer Paternal Grandfather     Allergies: Codeine; Decongest-aid [pseudoephedrine]; Flagyl [metronidazole]; and Sulfa antibiotics Current Outpatient Prescriptions on File Prior to Visit  Medication Sig Dispense Refill  . acetaminophen (TYLENOL) 500 MG tablet Take 1,000 mg by mouth every 6 (six) hours as needed for  moderate pain.    Marland Kitchen ALPRAZolam (XANAX) 0.25 MG tablet TAKE ONE TABLET BY MOUTH AT BEDTIME AS NEEDED FOR ANXIETY 30 tablet 1  . omeprazole (PRILOSEC) 40 MG capsule Take 40 mg by mouth daily.    . ranitidine (ZANTAC) 150 MG tablet Take 150 mg by mouth as needed.      No current facility-administered medications on file prior to visit.     Social History  Substance Use Topics  . Smoking status: Former Research scientist (life sciences)  . Smokeless tobacco: Never Used  . Alcohol use No    Review of Systems  Constitutional: Negative for chills and fever.  Respiratory: Negative for cough.   Cardiovascular: Negative for chest pain and palpitations.  Gastrointestinal: Negative for nausea and vomiting.  Genitourinary: Positive for dysuria and frequency. Negative for flank pain.      Objective:    BP 128/86   Pulse 85   Temp 98.1 F (36.7 C) (Oral)   Ht 5\' 2"  (1.575 m)   Wt 133 lb 3.2 oz (60.4 kg)   LMP 02/12/2013   SpO2 97%   BMI 24.36 kg/m    Physical Exam  Constitutional: She appears well-developed and well-nourished.  Cardiovascular: Normal rate, regular rhythm, normal heart sounds and normal pulses.   Pulmonary/Chest: Effort normal and breath  sounds normal. She has no wheezes. She has no rhonchi. She has no rales.  Abdominal: There is no CVA tenderness.  Neurological: She is alert.  Skin: Skin is warm and dry.  Psychiatric: She has a normal mood and affect. Her speech is normal and behavior is normal. Thought content normal.  Vitals reviewed.      Assessment & Plan:  1. Dysuria UA + trace blood. Patient is afebrile and well-appearing. We jointly decided that she's been using Cipro past couple days, with no improvement, we will await urine culture to see for able to get a culture and sensitivity report.  - POCT Urinalysis Dipstick - Urine Culture - Urine Microscopic     I have discontinued Ms. Giacomo's amoxicillin. I am also having her maintain her omeprazole, ranitidine, acetaminophen, and  ALPRAZolam.   No orders of the defined types were placed in this encounter.   Return precautions given.   Risks, benefits, and alternatives of the medications and treatment plan prescribed today were discussed, and patient expressed understanding.   Education regarding symptom management and diagnosis given to patient on AVS.  Continue to follow with [name], MD for routine health maintenance.   [name] and I agreed with plan.   Mable Paris, FNP

## 2016-05-26 NOTE — Patient Instructions (Signed)
Lets await urine culture as discussed.   Plenty of water  Azo over the counter

## 2016-05-26 NOTE — Progress Notes (Signed)
Patient's Name: Shannon [name]  MRN: [id number]  Referring Provider: [name], MD  DOB: [birth year]  PCP: [name], MD  DOS: 05/26/2016  Note by: Kathlen Brunswick. Dossie Arbour, MD  Service setting: Ambulatory outpatient  Location: [facility] (AMB) Pain Management Facility  Visit type: Procedure  Specialty: Interventional Pain Management  Patient type: Established   Primary Reason for Visit: Interventional Pain Management Treatment. CC: Back Pain (low right)  Procedure:  Anesthesia, Analgesia, Anxiolysis:  Type: Palliative Sacroiliac Joint Steroid Injection Region: Superior Lumbosacral Region Level: PSIS (Posterior Superior Iliac Spine) Laterality: Right-Sided  Type: Local Anesthesia Local Anesthetic: Lidocaine 1% Route: Infiltration ([place]/IM) IV Access: Declined Sedation: Declined  Indication(s): Analgesia          Indications: 1. Chronic sacroiliac joint pain (Right)   2. chronic low back pain (Bilateral) (L>R)   3. Osteoarthritis of sacroiliac joint (Bilateral) (L>R)    Pain Score: Pre-procedure: 3 /10 Post-procedure: 3 /10  Pre-op Assessment:  Previous date of service: 03/19/16 Service provided: Evaluation Shannon [name] is a 51 y.o. (year old), female patient, seen today for interventional treatment. She  has a past surgical history that includes Septoplasty (5/90); Nasal sinus surgery (11/93); Cesarean section (11/98); Tubal ligation (02/1998); Umbilical hernia repair (02/1998); Cholecystectomy (11/05); and Joint replacement (Left, 2017). Her primarily concern today is the Back Pain (low right)  Initial Vital Signs: Blood pressure (!) 141/69, pulse 77, temperature 98.2 F (36.8 C), resp. rate 18, height 5\' 2"  (1.575 m), weight 126 lb (57.2 kg), last menstrual period 02/12/2013, SpO2 100 %. BMI: 23.05 kg/m  Risk Assessment: Allergies: Reviewed. She is allergic to codeine; decongest-aid [pseudoephedrine]; flagyl [metronidazole]; and sulfa antibiotics.  Allergy Precautions: None  required Coagulopathies: "Reviewed. None identified.  Blood-thinner therapy: None at this time Active Infection(s): Reviewed. None identified. Shannon [name] is afebrile  Site Confirmation: Shannon [name] was asked to confirm the procedure and laterality before marking the site Procedure checklist: Completed Consent: Before the procedure and under the influence of no sedative(s), amnesic(s), or anxiolytics, the patient was informed of the treatment options, risks and possible complications. To fulfill our ethical and legal obligations, as recommended by the American Medical Association's Code of Ethics, I have informed the patient of my clinical impression; the nature and purpose of the treatment or procedure; the risks, benefits, and possible complications of the intervention; the alternatives, including doing nothing; the risk(s) and benefit(s) of the alternative treatment(s) or procedure(s); and the risk(s) and benefit(s) of doing nothing. The patient was provided information about the general risks and possible complications associated with the procedure. These may include, but are not limited to: failure to achieve desired goals, infection, bleeding, organ or nerve damage, allergic reactions, paralysis, and death. In addition, the patient was informed of those risks and complications associated to the procedure, such as failure to decrease pain; infection; bleeding; organ or nerve damage with subsequent damage to sensory, motor, and/or autonomic systems, resulting in permanent pain, numbness, and/or weakness of one or several areas of the body; allergic reactions; (i.e.: anaphylactic reaction); and/or death. Furthermore, the patient was informed of those risks and complications associated with the medications. These include, but are not limited to: allergic reactions (i.e.: anaphylactic or anaphylactoid reaction(s)); adrenal axis suppression; blood sugar elevation that in diabetics may result in ketoacidosis or  comma; water retention that in patients with history of congestive heart failure may result in shortness of breath, pulmonary edema, and decompensation with resultant heart failure; weight gain; swelling or edema; medication-induced neural toxicity; particulate  matter embolism and blood vessel occlusion with resultant organ, and/or nervous system infarction; and/or aseptic necrosis of one or more joints. Finally, the patient was informed that Medicine is not an exact science; therefore, there is also the possibility of unforeseen or unpredictable risks and/or possible complications that may result in a catastrophic outcome. The patient indicated having understood very clearly. We have given the patient no guarantees and we have made no promises. Enough time was given to the patient to ask questions, all of which were answered to the patient's satisfaction. Shannon [name] has indicated that she wanted to continue with the procedure. Attestation: I, the ordering provider, attest that I have discussed with the patient the benefits, risks, side-effects, alternatives, likelihood of achieving goals, and potential problems during recovery for the procedure that I have provided informed consent. Date: 05/26/2016; Time: 9:51 AM  Pre-Procedure Preparation:  Monitoring: As per clinic protocol. Respiration, ETCO2, SpO2, BP, heart rate and rhythm monitor placed and checked for adequate function Safety Precautions: Patient was assessed for positional comfort and pressure points before starting the procedure. Time-out: I initiated and conducted the "Time-out" before starting the procedure, as per protocol. The patient was asked to participate by confirming the accuracy of the "Time Out" information. Verification of the correct person, site, and procedure were performed and confirmed by me, the nursing staff, and the patient. "Time-out" conducted as per Joint Commission's Universal Protocol (UP.01.01.01). "Time-out" Date & Time:  05/26/2016; 1013 hrs.  Description of Procedure Process:  Position: Prone Target Area: Superior, posterior, aspect of the sacroiliac fissure Approach: Posterior, paraspinal, ipsilateral approach. Area Prepped: Entire Lower Lumbosacral Region Prepping solution: ChloraPrep (2% chlorhexidine gluconate and 70% isopropyl alcohol) Safety Precautions: Aspiration looking for blood return was conducted prior to all injections. At no point did we inject any substances, as a needle was being advanced. No attempts were made at seeking any paresthesias. Safe injection practices and needle disposal techniques used. Medications properly checked for expiration dates. SDV (single dose vial) medications used. Description of the Procedure: Protocol guidelines were followed. The patient was placed in position over the procedure table. The target area was identified and the area prepped in the usual manner. Skin & deeper tissues infiltrated with local anesthetic. Appropriate amount of time allowed to pass for local anesthetics to take effect. The procedure needle was advanced under fluoroscopic guidance into the sacroiliac joint until a firm endpoint was obtained. Proper needle placement secured. Negative aspiration confirmed. Solution injected in intermittent fashion, asking for systemic symptoms every 0.5cc of injectate. The needles were then removed and the area cleansed, making sure to leave some of the prepping solution back to take advantage of its long term bactericidal properties. Vitals:   05/26/16 0939 05/26/16 1005 05/26/16 1011 05/26/16 1016  BP: (!) 141/69 133/77 139/90 (!) 145/85  Pulse: 77 70 74 65  Resp: 18 16 18 16   Temp: 98.2 F (36.8 C)     SpO2: 100% 98% 100% 99%  Weight:      Height:        Start Time: 1013 hrs. End Time: 1015 hrs. Materials:  Needle(s) Type: Regular needle Gauge: 22G Length: 3.5-in Medication(s): We administered methylPREDNISolone acetate, lidocaine (PF), and ropivacaine  (PF) 2 mg/mL (0.2%). Please see chart orders for dosing details.  Imaging Guidance (Non-Spinal):  Type of Imaging Technique: Fluoroscopy Guidance (Non-Spinal) Indication(s): Assistance in needle guidance and placement for procedures requiring needle placement in or near specific anatomical locations not easily accessible without such assistance. Exposure Time:  Please see nurses notes. Contrast: Before injecting any contrast, we confirmed that the patient did not have an allergy to iodine, shellfish, or radiological contrast. Once satisfactory needle placement was completed at the desired level, radiological contrast was injected. Contrast injected under live fluoroscopy. No contrast complications. See chart for type and volume of contrast used. Fluoroscopic Guidance: I was personally present during the use of fluoroscopy. "Tunnel Vision Technique" used to obtain the best possible view of the target area. Parallax error corrected before commencing the procedure. "Direction-depth-direction" technique used to introduce the needle under continuous pulsed fluoroscopy. Once target was reached, antero-posterior, oblique, and lateral fluoroscopic projection used confirm needle placement in all planes. Images permanently stored in EMR. Interpretation: I personally interpreted the imaging intraoperatively. Adequate needle placement confirmed in multiple planes. Appropriate spread of contrast into desired area was observed. No evidence of afferent or efferent intravascular uptake. Permanent images saved into the patient's record.  Antibiotic Prophylaxis:  Indication(s): None identified Antibiotic given: None  Post-operative Assessment:  EBL: None Complications: No immediate post-treatment complications observed by team, or reported by patient. Note: The patient tolerated the entire procedure well. A repeat set of vitals were taken after the procedure and the patient was kept under observation following  institutional policy, for this type of procedure. Post-procedural neurological assessment was performed, showing return to baseline, prior to discharge. The patient was provided with post-procedure discharge instructions, including a section on how to identify potential problems. Should any problems arise concerning this procedure, the patient was given instructions to immediately contact us, at any time, without hesitation. In any case, we plan to contact the patient by telephone for a follow-up status report regarding this interventional procedure. Comments:  No additional relevant information.  Plan of Care  Disposition: Discharge home  Discharge Date & Time: 05/26/2016; 1025 hrs.  Physician-requested Follow-up:  Return for Post-procedure eval (in 2 wks), w/ MD.  Future Appointments Date Time Provider [place]  05/26/2016 4:30 PM Burnard Hawthorne, FNP [facility] None  07/02/2016 1:15 PM Milinda Pointer, MD [facility] None  08/25/2016 2:00 PM [name], MD [facility] None   Medications ordered for procedure: Meds ordered this encounter  Medications  . methylPREDNISolone acetate (DEPO-MEDROL) injection 80 mg  . lidocaine (PF) (XYLOCAINE) 1 % injection 10 mL  . ropivacaine (PF) 2 mg/mL (0.2%) (NAROPIN) injection 4 mL   Medications administered: We administered methylPREDNISolone acetate, lidocaine (PF), and ropivacaine (PF) 2 mg/mL (0.2%).  See the medical record for exact dosing, route, and time of administration.  Lab-work, Procedure(s), & Referral(s) Ordered: Orders Placed This Encounter  Procedures  . SACROILIAC JOINT INJECTINS  . DG C-Arm 1-60 Min-No Report  . Informed Consent Details: Transcribe to consent form and obtain patient signature  . Verify informed consent  . Provider attestation of informed consent for procedure/surgical case  . Discharge instructions   Imaging Ordered: Results for orders placed in visit on 01/14/16  DG C-Arm 1-60 Min-No Report    Narrative There is no Radiologist interpretation  for this exam.   New Prescriptions   No medications on file   Primary Care Physician: [name], MD Location: [facility] Out[facility] Management Facility Note by: Kathlen Brunswick. Dossie Arbour, M.D, DABA, DABAPM, DABPM, DABIPP, FIPP Date: 05/26/2016; Time: 3:26 PM  Disclaimer:  Medicine is not an exact science. The only guarantee in medicine is that nothing is guaranteed. It is important to note that the decision to proceed with this intervention was based on the information collected from the patient.  The Data and conclusions were drawn from the patient's questionnaire, the interview, and the physical examination. Because the information was provided in large part by the patient, it cannot be guaranteed that it has not been purposely or unconsciously manipulated. Every effort has been made to obtain as much relevant data as possible for this evaluation. It is important to note that the conclusions that lead to this procedure are derived in large part from the available data. Always take into account that the treatment will also be dependent on availability of resources and existing treatment guidelines, considered by other Pain Management Practitioners as being common knowledge and practice, at the time of the intervention. For Medico-Legal purposes, it is also important to point out that variation in procedural techniques and pharmacological choices are the acceptable norm. The indications, contraindications, technique, and results of the above procedure should only be interpreted and judged by a Board-Certified Interventional Pain Specialist with extensive familiarity and expertise in the same exact procedure and technique.  Instructions provided at this appointment: Patient Instructions  Post-Procedure instructions Instructions:  Apply ice: Fill a plastic sandwich bag with crushed ice. Cover it with a small towel and apply to injection site. Apply for  15 minutes then remove x 15 minutes. Repeat sequence on day of procedure, until you go to bed. The purpose is to minimize swelling and discomfort after procedure.  Apply heat: Apply heat to procedure site starting the day following the procedure. The purpose is to treat any soreness and discomfort from the procedure.  Food intake: Start with clear liquids (like water) and advance to regular food, as tolerated.   Physical activities: Keep activities to a minimum for the first 8 hours after the procedure.   Driving: If you have received any sedation, you are not allowed to drive for 24 hours after your procedure.  Blood thinner: Restart your blood thinner 6 hours after your procedure. (Only for those taking blood thinners)  Insulin: As soon as you can eat, you may resume your normal dosing schedule. (Only for those taking insulin)  Infection prevention: Keep procedure site clean and dry.  Post-procedure Pain Diary: Extremely important that this be done correctly and accurately. Recorded information will be used to determine the next step in treatment.  Pain evaluated is that of treated area only. Do not include pain from an untreated area.  Complete every hour, on the hour, for the initial 8 hours. Set an alarm to help you do this part accurately.  Do not go to sleep and have it completed later. It will not be accurate.  Follow-up appointment: Keep your follow-up appointment after the procedure. Usually 2 weeks for most procedures. (6 weeks in the case of radiofrequency.) Bring you pain diary.  Expect:  From numbing medicine (AKA: Local Anesthetics): Numbness or decrease in pain.  Onset: Full effect within 15 minutes of injected.  Duration: It will depend on the type of local anesthetic used. On the average, 1 to 8 hours.   From steroids: Decrease in swelling or inflammation. Once inflammation is improved, relief of the pain will follow.  Onset of benefits: Depends on the amount of  swelling present. The more swelling, the longer it will take for the benefits to be seen.   Duration: Steroids will stay in the system x 2 weeks. Duration of benefits will depend on multiple posibilities including persistent irritating factors.  From procedure: Some discomfort is to be expected once the numbing medicine wears off. This should be minimal  if ice and heat are applied as instructed. Call if:  You experience numbness and weakness that gets worse with time, as opposed to wearing off.  New onset bowel or bladder incontinence. (Spinal procedures only)  Emergency Numbers:  [place] hours (Monday - Thursday, 8:00 AM - 4:00 PM) (Friday, 9:00 AM - 12:00 Noon): [phone]  After hours: [phone] _____________________________________________________________________________________________  Sacroiliac (SI) Joint Injection Patient Information  Description: The sacroiliac joint connects the scrum (very low back and tailbone) to the ilium (a pelvic bone which also forms half of the hip joint).  Normally this joint experiences very little motion.  When this joint becomes inflamed or unstable low back and or hip and pelvis pain may result.  Injection of this joint with local anesthetics (numbing medicines) and steroids can provide diagnostic information and reduce pain.  This injection is performed with the aid of x-ray guidance into the tailbone area while you are lying on your stomach.   You may experience an electrical sensation down the leg while this is being done.  You may also experience numbness.  We also may ask if we are reproducing your normal pain during the injection.  Conditions which may be treated SI injection:   Low back, buttock, hip or leg pain  Preparation for the Injection:  1. Do not eat any solid food or dairy products within 8 hours of your appointment.  2. You may drink clear liquids up to 3 hours before appointment.  Clear liquids include water,  black coffee, juice or soda.  No milk or cream please. 3. You may take your regular medications, including pain medications with a sip of water before your appointment.  Diabetics should hold regular insulin (if take separately) and take 1/2 normal NPH dose the morning of the procedure.  Carry some sugar containing items with you to your appointment. 4. A driver must accompany you and be prepared to drive you home after your procedure. 5. Bring all of your current medications with you. 6. An IV may be inserted and sedation may be given at the discretion of the physician. 7. A blood pressure cuff, EKG and other monitors will often be applied during the procedure.  Some patients may need to have extra oxygen administered for a short period.  8. You will be asked to provide medical information, including your allergies, prior to the procedure.  We must know immediately if you are taking blood thinners (like Coumadin/Warfarin) or if you are allergic to IV iodine contrast (dye).  We must know if you could possible be pregnant.  Possible side effects:   Bleeding from needle site  Infection (rare, may require surgery)  Nerve injury (rare)  Numbness & tingling (temporary)  A brief convulsion or seizure  Light-headedness (temporary)  Pain at injection site (several days)  Decreased blood pressure (temporary)  Weakness in the leg (temporary)   Call if you experience:   New onset weakness or numbness of an extremity below the injection site that last more than 8 hours.  Hives or difficulty breathing ( go to the emergency room)  Inflammation or drainage at the injection site  Any new symptoms which are concerning to you  Please note:  Although the local anesthetic injected can often make your back/ hip/ buttock/ leg feel good for several hours after the injections, the pain will likely return.  It takes 3-7 days for steroids to work in the sacroiliac area.  You may not notice any pain  relief for at least that one week.  If effective, we will often do a series of three injections spaced 3-6 weeks apart to maximally decrease your pain.  After the initial series, we generally will wait some months before a repeat injection of the same type.  If you have any questions, please call [phone] [facility]  Pain Management Discharge Instructions  General Discharge Instructions :  If you need to reach your doctor call: Monday-Friday 8:00 am - 4:00 pm at [phone] or toll free [phone].  After clinic hours [phone] to have operator reach doctor.  Bring all of your medication bottles to all your appointments in the pain clinic.  To cancel or reschedule your appointment with Pain Management please remember to call 24 hours in advance to avoid a fee.  Refer to the educational materials which you have been given on: General Risks, I had my Procedure. Discharge Instructions, Post Sedation.  Post Procedure Instructions:  The drugs you were given will stay in your system until tomorrow, so for the next 24 hours you should not drive, make any legal decisions or drink any alcoholic beverages.  You may eat anything you prefer, but it is better to start with liquids then soups and crackers, and gradually work up to solid foods.  Please notify your doctor immediately if you have any unusual bleeding, trouble breathing or pain that is not related to your normal pain.  Depending on the type of procedure that was done, some parts of your body may feel week and/or numb.  This usually clears up by tonight or the next day.  Walk with the use of an assistive device or accompanied by an adult for the 24 hours.  You may use ice on the affected area for the first 24 hours.  Put ice in a Ziploc bag and cover with a towel and place against area 15 minutes on 15 minutes off.  You may switch to heat after 24 hours.

## 2016-05-26 NOTE — Progress Notes (Signed)
Pre visit review using our clinic review tool, if applicable. No additional management support is needed unless otherwise documented below in the visit note. 

## 2016-05-27 ENCOUNTER — Telehealth: Payer: Self-pay | Admitting: *Deleted

## 2016-05-27 ENCOUNTER — Encounter: Payer: Self-pay | Admitting: Family

## 2016-05-27 LAB — URINALYSIS, MICROSCOPIC ONLY: RBC / HPF: NONE SEEN (ref 0–?)

## 2016-05-27 LAB — URINE CULTURE: Organism ID, Bacteria: NO GROWTH

## 2016-05-27 NOTE — Telephone Encounter (Signed)
enies complications post procedure

## 2016-05-28 ENCOUNTER — Other Ambulatory Visit: Payer: Self-pay | Admitting: Family

## 2016-05-28 ENCOUNTER — Encounter: Payer: Self-pay | Admitting: Family

## 2016-05-28 DIAGNOSIS — R300 Dysuria: Secondary | ICD-10-CM

## 2016-05-28 MED ORDER — AMOXICILLIN-POT CLAVULANATE 875-125 MG PO TABS: 1.0000 | ORAL_TABLET | Freq: Two times a day (BID) | ORAL | 0 refills | Status: AC

## 2016-05-30 ENCOUNTER — Encounter: Payer: Self-pay | Admitting: Internal Medicine

## 2016-06-03 DIAGNOSIS — J3089 Other allergic rhinitis: Secondary | ICD-10-CM | POA: Diagnosis not present

## 2016-06-03 DIAGNOSIS — J301 Allergic rhinitis due to pollen: Secondary | ICD-10-CM | POA: Diagnosis not present

## 2016-06-05 ENCOUNTER — Other Ambulatory Visit: Payer: Self-pay | Admitting: Internal Medicine

## 2016-06-05 NOTE — Telephone Encounter (Signed)
rx ok'd for xanax #30 with no refills.   

## 2016-06-05 NOTE — Telephone Encounter (Signed)
Last OV with you was 02/25/16, had an acute visit with NP on 05/26/2016.  Last refill was 02/25/2016.  Please advise, thanks

## 2016-06-10 DIAGNOSIS — J301 Allergic rhinitis due to pollen: Secondary | ICD-10-CM | POA: Diagnosis not present

## 2016-06-10 DIAGNOSIS — J3089 Other allergic rhinitis: Secondary | ICD-10-CM | POA: Diagnosis not present

## 2016-06-11 ENCOUNTER — Telehealth: Payer: Self-pay | Admitting: Internal Medicine

## 2016-06-11 DIAGNOSIS — R300 Dysuria: Secondary | ICD-10-CM

## 2016-06-11 NOTE — Telephone Encounter (Signed)
Reason for call: urinary frequency  Symptoms:urinary frequency 2-3 days , discomfort in vaginal area , vaginal irritation , not aware of any vaginal discharge  Duration 2-3 weeks  Medications: Replens,  Last seen for this problem:05/16/16 Seen by: Vidal Schwalbe

## 2016-06-11 NOTE — Telephone Encounter (Signed)
Pt called and stated that she is having some discomfort in the genitals and that she is having frequent urination. She also states that she has some irration there as well. Please advise, thank you!  Call pt @ [phone]

## 2016-06-11 NOTE — Telephone Encounter (Signed)
   Please call pt-- she may leave urine since I saw her recently

## 2016-06-12 ENCOUNTER — Other Ambulatory Visit (INDEPENDENT_AMBULATORY_CARE_PROVIDER_SITE_OTHER): Payer: 59

## 2016-06-12 DIAGNOSIS — R300 Dysuria: Secondary | ICD-10-CM

## 2016-06-12 LAB — URINALYSIS, MICROSCOPIC ONLY

## 2016-06-12 NOTE — Telephone Encounter (Signed)
Patient has scheduled lab appointment today. Orders will be place.

## 2016-06-13 ENCOUNTER — Ambulatory Visit (INDEPENDENT_AMBULATORY_CARE_PROVIDER_SITE_OTHER): Payer: 59 | Admitting: Internal Medicine

## 2016-06-13 ENCOUNTER — Encounter: Payer: Self-pay | Admitting: Internal Medicine

## 2016-06-13 VITALS — BP 126/78 | HR 73 | Temp 98.1°F | Resp 16 | Ht 62.0 in | Wt 131.4 lb

## 2016-06-13 DIAGNOSIS — N760 Acute vaginitis: Secondary | ICD-10-CM | POA: Diagnosis not present

## 2016-06-13 DIAGNOSIS — R300 Dysuria: Secondary | ICD-10-CM

## 2016-06-13 LAB — POCT URINALYSIS DIPSTICK
Bilirubin, UA: NEGATIVE
GLUCOSE UA: NEGATIVE
Ketones, UA: NEGATIVE
Leukocytes, UA: NEGATIVE
NITRITE UA: NEGATIVE
PH UA: 6 (ref 5.0–8.0)
PROTEIN UA: NEGATIVE
RBC UA: NEGATIVE
Spec Grav, UA: <=1.005 — AB (ref 1.010–1.025)
Urobilinogen, UA: 0.2 E.U./dL

## 2016-06-13 LAB — URINE CULTURE

## 2016-06-13 MED ORDER — METRONIDAZOLE 0.75 % VA GEL: 1.0000 | Freq: Two times a day (BID) | VAGINAL | 0 refills | Status: DC

## 2016-06-13 MED ORDER — ESTROGENS, CONJUGATED 0.625 MG/GM VA CREA: TOPICAL_CREAM | VAGINAL | 0 refills | Status: DC

## 2016-06-13 NOTE — Patient Instructions (Addendum)
I am treating your for bacterial vaginosis with metronidazole vaginal .  Once you have finished th treatment , I reommend starting vaginal estrogen using the coupon for premarin.   The treatment involves using the estrogen every night for 2 weeks,  Then reducing the frequency to two times per week FOREVER    Atrophic Vaginitis Atrophic vaginitis is a condition in which the tissues that line the vagina become dry and thin. This condition is most common in women who have stopped having regular menstrual periods (menopause). This usually starts when a woman is 13-55 years old. Estrogen helps to keep the vagina moist. It stimulates the vagina to produce a clear fluid that lubricates the vagina for sexual intercourse. This fluid also protects the vagina from infection. Lack of estrogen can cause the lining of the vagina to get thinner and dryer. The vagina may also shrink in size. It may become less elastic. Atrophic vaginitis tends to get worse over time as a woman's estrogen level drops. What are the causes? This condition is caused by the normal drop in estrogen that happens around the time of menopause. What increases the risk? Certain conditions or situations may lower a woman's estrogen level, which increases her risk of atrophic vaginitis. These include:  Taking medicine that blocks estrogen.  Having ovaries removed surgically.  Being treated for cancer with X-ray treatment (radiation) or medicines (chemotherapy).  Exercising very hard and often.  Having an eating disorder (anorexia).  Giving birth or breastfeeding.  Being over the age of 10.  Smoking. What are the signs or symptoms? Symptoms of this condition include:  Pain, soreness, or bleeding during sexual intercourse (dyspareunia).  Vaginal burning, irritation, or itching.  Pain or bleeding during a vaginal examination using a speculum (pelvic exam).  Loss of interest in sexual activity.  Having burning pain when  passing urine.  Vaginal discharge that is brown or yellow. In some cases, there are no symptoms. How is this diagnosed? This condition is diagnosed with a medical history and physical exam. This will include a pelvic exam that checks whether the inside of your vagina appears pale, thin, or dry. Rarely, you may also have other tests, including:  A urine test.  A test that checks the acid balance in your vaginal fluid (acid balance test). How is this treated? Treatment for this condition may depend on the severity of your symptoms. Treatment may include:  Using an over-the-counter vaginal lubricant before you have sexual intercourse.  Using a long-acting vaginal moisturizer.  Using low-dose vaginal estrogen for moderate to severe symptoms that do not respond to other treatments. Options include creams, tablets, and inserts (vaginal rings). Before using vaginal estrogen, tell your health care provider if you have a history of:  Breast cancer.  Endometrial cancer.  Blood clots.  Taking medicines. You may be able to take a daily pill for dyspareunia. Discuss all of the risks of this medicine with your health care provider. It is usually not recommended for women who have a family history or personal history of breast cancer. If your symptoms are very mild and you are not sexually active, you may not need treatment. Follow these instructions at home:  Take medicines only as directed by your health care provider. Do not use herbal or alternative medicines unless your health care provider says that you can.  Use over-the-counter creams, lubricants, or moisturizers for dryness only as directed by your health care provider.  If your atrophic vaginitis is caused by menopause,  discuss all of your menopausal symptoms and treatment options with your health care provider.  Do not douche.  Do not use products that can make your vagina dry. These include:  Scented feminine sprays.  Scented  tampons.  Scented soaps.  If it hurts to have sex, talk with your sexual partner. Contact a health care provider if:  Your discharge looks different than normal.  Your vagina has an unusual smell.  You have new symptoms.  Your symptoms do not improve with treatment.  Your symptoms get worse. This information is not intended to replace advice given to you by your health care provider. Make sure you discuss any questions you have with your health care provider. Document Released: 05/30/2014 Document Revised: 06/21/2015 Document Reviewed: 01/04/2014 Elsevier Interactive Patient Education  2017 [employer].

## 2016-06-13 NOTE — Progress Notes (Signed)
Subjective:  Patient ID: Shannon [name], female    DOB: 05-11-1965  Age: 51 y.o. MRN: [id number]  CC: The primary encounter diagnosis was Dysuria. A diagnosis of Acute vaginitis was also pertinent to this visit.  HPI Shannon [name] presents for concern for UTI .  She has been having increased suprapubic pressure,  Frequent urination,  And urethral burning with urination for the past week,  And started having  vaginal discharge for the last few days,  The discharge is thin, odorless and clear to light yellow .  She is post menopausal and has had dyspareunia secondary to vaginal dryness.   Last PAP Jan 2018 noted atrophy.   Last sexual enounter was 4 weeks ago, with her husband , and she used [employer] as a lubricant.  She does not douche   ,   The problem is recurrent.  3  Weeks  Ago, was treated empirically for UTI with augmentin, urine culture was negative.  She reports that she also used Replens 3 times during the period she was taking augmentin  And  symptoms improved slightly but did not resolve, and have been worse for hte past several days  Urine micro obtained yesterday notes few clue cells.   Outpatient Medications Prior to Visit  Medication Sig Dispense Refill  . acetaminophen (TYLENOL) 500 MG tablet Take 1,000 mg by mouth every 6 (six) hours as needed for moderate pain.    Marland Kitchen ALPRAZolam (XANAX) 0.25 MG tablet TAKE ONE TABLET AT BEDTIME AS NEEDED FORANXIETY 30 tablet 0  . omeprazole (PRILOSEC) 40 MG capsule Take 40 mg by mouth daily.    . ranitidine (ZANTAC) 150 MG tablet Take 150 mg by mouth as needed.      No facility-administered medications prior to visit.     Review of Systems;  Patient denies headache, fevers, malaise, unintentional weight loss, skin rash, eye pain, sinus congestion and sinus pain, sore throat, dysphagia,  hemoptysis , cough, dyspnea, wheezing, chest pain, palpitations, orthopnea, edema, abdominal pain, nausea, melena, diarrhea, constipation, flank pain,  dysuria, hematuria, urinary  Frequency, nocturia, numbness, tingling, seizures,  Focal weakness, Loss of consciousness,  Tremor, insomnia, depression, anxiety, and suicidal ideation.      Objective:  BP 126/78 (BP Location: Left Arm, Patient Position: Sitting, Cuff Size: Normal)   Pulse 73   Temp 98.1 F (36.7 C) (Oral)   Resp 16   Ht 5\' 2"  (1.575 m)   Wt 131 lb 6.4 oz (59.6 kg)   LMP 02/12/2013   SpO2 98%   BMI 24.03 kg/m   BP Readings from Last 3 Encounters:  06/13/16 126/78  05/26/16 128/86  05/26/16 (!) 145/85    Wt Readings from Last 3 Encounters:  06/13/16 131 lb 6.4 oz (59.6 kg)  05/26/16 133 lb 3.2 oz (60.4 kg)  05/26/16 126 lb (57.2 kg)    General Appearance:    Alert, cooperative, no distress, appears stated age  Head:    Normocephalic, without obvious abnormality, atraumatic  Eyes:    PERRL, conjunctiva/corneas clear, EOM's intact, fundi    benign, both eyes  Ears:    Normal TM's and external ear canals, both ears  Nose:   Nares normal, septum midline, mucosa normal, no drainage    or sinus tenderness  Throat:   Lips, mucosa, and tongue normal; teeth and gums normal  Neck:   Supple, symmetrical, trachea midline, no adenopathy;    thyroid:  no enlargement/tenderness/nodules; no carotid   bruit or JVD  Back:     Symmetric, no curvature, ROM normal, no CVA tenderness  Lungs:     Clear to auscultation bilaterally, respirations unlabored  Chest Wall:    No tenderness or deformity   Heart:    Regular rate and rhythm, S1 and S2 normal, no murmur, rub   or gallop     Abdomen:     Soft, non-tender, bowel sounds active all four quadrants,    no masses, no organomegaly  Genitalia:    Pelvic: cervix normal in appearance, external genitalia normal, no adnexal masses or tenderness, no cervical motion tenderness, vaginal walls pale, with a small amount of think pale yellow non odorous  discharge                   Lab Results  Component Value Date   CREATININE  0.72 03/07/2016   CREATININE 0.74 03/22/2015   CREATININE 0.62 08/30/2014    Lab Results  Component Value Date   WBC 8.0 03/07/2016   HGB 13.9 03/07/2016   HCT 41.7 03/07/2016   PLT 274.0 03/07/2016   GLUCOSE 96 03/07/2016   CHOL 211 (H) 03/07/2016   TRIG 142.0 03/07/2016   HDL 80.10 03/07/2016   LDLCALC 103 (H) 03/07/2016   ALT 26 03/07/2016   AST 17 03/07/2016   NA 140 03/07/2016   K 3.7 03/07/2016   CL 104 03/07/2016   CREATININE 0.72 03/07/2016   BUN 17 03/07/2016   CO2 28 03/07/2016   TSH 1.80 03/07/2016    Dg C-arm 1-60 Min-no Report  Result Date: 05/26/2016 Fluoroscopy was utilized by the requesting physician.  No radiographic interpretation.    Assessment & Plan:   Problem List Items Addressed This Visit    Acute vaginitis    She was treated with vaginal metronidazole empirically,  Given the notation of clue cells on urine micro that was obtained one day prior to presentation.  Pelvic exam suggested post menopausal atrophic vaginitis,  Which would make her less tolerant of BV . However,  Cultures for all STDS including BV were NEGATIVE.  Recommended that she start Premarin following completion of treatment       Relevant Orders   WET PREP BY MOLECULAR PROBE (Completed)   GC/Chlamydia Probe Amp    Other Visit Diagnoses    Dysuria    -  Primary   Relevant Orders   POCT Urinalysis Dipstick (Completed)      I am having Ms. Epple start on metroNIDAZOLE and conjugated estrogens. I am also having her maintain her omeprazole, ranitidine, acetaminophen, and ALPRAZolam.  Meds ordered this encounter  Medications  . metroNIDAZOLE (METROGEL) 0.75 % vaginal gel    Sig: Place 1 Applicatorful vaginally 2 (two) times daily.    Dispense:  70 g    Refill:  0  . conjugated estrogens (PREMARIN) vaginal cream    Sig: 1 APPLICATOR INTRAVAGINALLY EVERY NIGHT FOR 2 WEEKS,  THEN TWICE WEEKLY    Dispense:  42.5 g    Refill:  0    There are no discontinued  medications.  Follow-up: No Follow-up on file.   Crecencio Mc, MD

## 2016-06-14 LAB — WET PREP BY MOLECULAR PROBE
CANDIDA SPECIES: NOT DETECTED
GARDNERELLA VAGINALIS: NOT DETECTED
TRICHOMONAS VAG: NOT DETECTED

## 2016-06-15 ENCOUNTER — Encounter: Payer: Self-pay | Admitting: Internal Medicine

## 2016-06-15 DIAGNOSIS — N760 Acute vaginitis: Secondary | ICD-10-CM | POA: Insufficient documentation

## 2016-06-15 NOTE — Assessment & Plan Note (Signed)
She was treated with vaginal metronidazole empirically,  Given the notation of clue cells on urine micro that was obtained one day prior to presentation.  Pelvic exam suggested post menopausal atrophic vaginitis,  Which would make her less tolerant of BV . However,  Cultures for all STDS including BV were NEGATIVE.  Recommended that she start Premarin following completion of treatment

## 2016-06-16 ENCOUNTER — Telehealth: Payer: Self-pay | Admitting: Radiology

## 2016-06-16 LAB — GC/CHLAMYDIA PROBE AMP

## 2016-06-16 NOTE — Telephone Encounter (Signed)
I did not see this pt.  It appears that Dr Derrel Nip saw her while I was out of the office.  Please notify Dr Derrel Nip and see if she feels she needs any further testing.

## 2016-06-16 NOTE — Telephone Encounter (Signed)
[facility] called and stated that GC/Chlamydia was collected with the incorrect swab. [facility] states specimen will have to recollected but can be test by urine. Would you like pt to come back?

## 2016-06-17 DIAGNOSIS — J3089 Other allergic rhinitis: Secondary | ICD-10-CM | POA: Diagnosis not present

## 2016-06-17 DIAGNOSIS — J301 Allergic rhinitis due to pollen: Secondary | ICD-10-CM | POA: Diagnosis not present

## 2016-06-17 NOTE — Addendum Note (Signed)
Addended by: Leeanne Rio on: 06/17/2016 08:33 AM   Modules accepted: Orders

## 2016-06-18 ENCOUNTER — Encounter: Payer: Self-pay | Admitting: Internal Medicine

## 2016-06-18 DIAGNOSIS — R300 Dysuria: Secondary | ICD-10-CM

## 2016-06-18 DIAGNOSIS — R350 Frequency of micturition: Secondary | ICD-10-CM

## 2016-06-19 DIAGNOSIS — J3089 Other allergic rhinitis: Secondary | ICD-10-CM | POA: Diagnosis not present

## 2016-06-23 NOTE — Telephone Encounter (Signed)
Order placed for urology referral.  See my chart message.

## 2016-06-26 DIAGNOSIS — J3089 Other allergic rhinitis: Secondary | ICD-10-CM | POA: Diagnosis not present

## 2016-06-26 DIAGNOSIS — J301 Allergic rhinitis due to pollen: Secondary | ICD-10-CM | POA: Diagnosis not present

## 2016-06-30 ENCOUNTER — Ambulatory Visit: Payer: 59 | Admitting: Pain Medicine

## 2016-06-30 DIAGNOSIS — H1045 Other chronic allergic conjunctivitis: Secondary | ICD-10-CM | POA: Diagnosis not present

## 2016-06-30 DIAGNOSIS — H5213 Myopia, bilateral: Secondary | ICD-10-CM | POA: Diagnosis not present

## 2016-06-30 DIAGNOSIS — H524 Presbyopia: Secondary | ICD-10-CM | POA: Diagnosis not present

## 2016-06-30 NOTE — Telephone Encounter (Signed)
Please call her and put her in open spot tomorrow.

## 2016-07-01 ENCOUNTER — Ambulatory Visit (INDEPENDENT_AMBULATORY_CARE_PROVIDER_SITE_OTHER): Payer: 59 | Admitting: Internal Medicine

## 2016-07-01 ENCOUNTER — Encounter: Payer: Self-pay | Admitting: Internal Medicine

## 2016-07-01 VITALS — BP 124/74 | HR 76 | Temp 98.7°F | Wt 131.0 lb

## 2016-07-01 DIAGNOSIS — L298 Other pruritus: Secondary | ICD-10-CM

## 2016-07-01 DIAGNOSIS — J3089 Other allergic rhinitis: Secondary | ICD-10-CM | POA: Diagnosis not present

## 2016-07-01 DIAGNOSIS — N949 Unspecified condition associated with female genital organs and menstrual cycle: Secondary | ICD-10-CM

## 2016-07-01 DIAGNOSIS — J301 Allergic rhinitis due to pollen: Secondary | ICD-10-CM | POA: Diagnosis not present

## 2016-07-01 DIAGNOSIS — N9489 Other specified conditions associated with female genital organs and menstrual cycle: Secondary | ICD-10-CM

## 2016-07-01 DIAGNOSIS — N898 Other specified noninflammatory disorders of vagina: Secondary | ICD-10-CM

## 2016-07-01 MED ORDER — NYSTATIN 100000 UNIT/GM EX CREA: 1.0000 "application " | TOPICAL_CREAM | Freq: Two times a day (BID) | CUTANEOUS | 0 refills | Status: DC

## 2016-07-01 NOTE — Progress Notes (Signed)
Pre-visit discussion using our clinic review tool. No additional management support is needed unless otherwise documented below in the visit note.  

## 2016-07-01 NOTE — Progress Notes (Signed)
Patient ID: Shannon [name], female   DOB: 1965-10-25, 51 y.o.   MRN: [id number]   Subjective:    Patient ID: Shannon [name], female    DOB: Oct 30, 1965, 51 y.o.   MRN: [id number]  HPI  Patient here as a work in with concerns regarding vaginal burning.  She reports dysuria 05/26/16.  Saw Mable Paris.  Note reviewed.  Treated with AZO and augmentin.  Reevaluated for persistent dysuria by Dr Derrel Nip 06/13/16.  Note reviewed.  Treated with metrogel.  She reports that her dysuria has resolved.  The lower abdominal pressure resolved.  No abdominal pain.  No nausea or vomiting.  No bowel change.  Reports that starting approximately 4-5 days ago, developed vaginal burning.  Feels mostly on outside of vaginal area.  No vagina discharge.  No intravaginal irritations.  No bleeding.  Using ice packs.  Has been red.  Is better today.     Past Medical History:  Diagnosis Date  . Anemia   . Breast pain, right 11/07/2012  . Breathing difficulty 06/20/2014  . Environmental allergies   . GERD (gastroesophageal reflux disease)   . Hiatal hernia    small  . History of migraine headaches   . Hyperthyroidism    s/p ablation  . Vaginitis 01/16/2013   Past Surgical History:  Procedure Laterality Date  . CESAREAN SECTION  11/98   Dr Roena Malady  . CHOLECYSTECTOMY  11/05   Dr Pat Patrick  . JOINT REPLACEMENT Left 2017  . NASAL SINUS SURGERY  11/93  . SEPTOPLASTY  5/90   Dr Rossie Muskrat  . TUBAL LIGATION  02/1998  . UMBILICAL HERNIA REPAIR  02/1998   Family History  Problem Relation Age of Onset  . Heart disease Father   . Hypertension Father   . Thyroid disease Father   . Hypercholesterolemia Father   . Kidney cancer Father   . Leukemia Father        hairy cell  . Hypercholesterolemia Mother   . Thyroid disease Mother   . Breast cancer Mother 81  . Breast cancer Unknown        maternal great grandmother  . Heart disease Maternal Grandfather        MI - age 78  . Prostate cancer Paternal Grandfather   .  Pancreatic cancer Paternal Grandfather   . Lung cancer Paternal Grandfather    Social History   Social History  . Marital status: Married    Spouse name: N/A  . Number of children: 1  . Years of education: N/A   Social History Main Topics  . Smoking status: Former Research scientist (life sciences)  . Smokeless tobacco: Never Used  . Alcohol use No  . Drug use: No  . Sexual activity: Not Asked   Other Topics Concern  . None   Social History Narrative  . None    Outpatient Encounter Prescriptions as of 07/01/2016  Medication Sig  . acetaminophen (TYLENOL) 500 MG tablet Take 1,000 mg by mouth every 6 (six) hours as needed for moderate pain.  Marland Kitchen ALPRAZolam (XANAX) 0.25 MG tablet TAKE ONE TABLET AT BEDTIME AS NEEDED FORANXIETY  . omeprazole (PRILOSEC) 40 MG capsule Take 40 mg by mouth daily.  . ranitidine (ZANTAC) 150 MG tablet Take 150 mg by mouth as needed.   . conjugated estrogens (PREMARIN) vaginal cream 1 APPLICATOR INTRAVAGINALLY EVERY NIGHT FOR 2 WEEKS,  THEN TWICE WEEKLY (Patient not taking: Reported on 07/01/2016)  . nystatin cream (MYCOSTATIN) Apply 1 application topically 2 (two) times  daily.  . [DISCONTINUED] metroNIDAZOLE (METROGEL) 0.75 % vaginal gel Place 1 Applicatorful vaginally 2 (two) times daily.   No facility-administered encounter medications on file as of 07/01/2016.     Review of Systems  Constitutional: Negative for appetite change and unexpected weight change.  Cardiovascular: Negative for chest pain and palpitations.  Gastrointestinal: Negative for abdominal pain, diarrhea, nausea and vomiting.  Genitourinary: Negative for difficulty urinating and dysuria.       Vaginal irritation and burning.    Musculoskeletal: Negative for back pain and myalgias.  Skin: Negative for color change and rash.  Psychiatric/Behavioral: Negative for agitation and dysphoric mood.       Objective:    Physical Exam  Constitutional: She appears well-developed and well-nourished. No distress.  Neck:  Neck supple.  Cardiovascular: Normal rate and regular rhythm.   Pulmonary/Chest: Breath sounds normal. No respiratory distress. She has no wheezes.  Abdominal: Soft. Bowel sounds are normal. There is no tenderness.  Genitourinary:  Genitourinary Comments: Normal external genitalia.  No significant rash.  No swelling.  Vaginal vault without lesions.  Could not appreciate any adnexal masses or tenderness.  KOH/wer prep obtained.    Musculoskeletal: She exhibits no edema or tenderness.  Lymphadenopathy:    She has no cervical adenopathy.  Skin: No rash noted. No erythema.  Psychiatric: She has a normal mood and affect. Her behavior is normal.    BP 124/74 (BP Location: Left Arm, Patient Position: Sitting, Cuff Size: Normal)   Pulse 76   Temp 98.7 F (37.1 C) (Oral)   Wt 131 lb (59.4 kg)   LMP 02/12/2013   SpO2 98%   BMI 23.96 kg/m  Wt Readings from Last 3 Encounters:  07/01/16 131 lb (59.4 kg)  06/13/16 131 lb 6.4 oz (59.6 kg)  05/26/16 133 lb 3.2 oz (60.4 kg)     Lab Results  Component Value Date   WBC 8.0 03/07/2016   HGB 13.9 03/07/2016   HCT 41.7 03/07/2016   PLT 274.0 03/07/2016   GLUCOSE 96 03/07/2016   CHOL 211 (H) 03/07/2016   TRIG 142.0 03/07/2016   HDL 80.10 03/07/2016   LDLCALC 103 (H) 03/07/2016   ALT 26 03/07/2016   AST 17 03/07/2016   NA 140 03/07/2016   K 3.7 03/07/2016   CL 104 03/07/2016   CREATININE 0.72 03/07/2016   BUN 17 03/07/2016   CO2 28 03/07/2016   TSH 1.80 03/07/2016    Dg C-arm 1-60 Min-no Report  Result Date: 05/26/2016 Fluoroscopy was utilized by the requesting physician.  No radiographic interpretation.       Assessment & Plan:   Problem List Items Addressed This Visit    Vaginal burning    The previous dysuria and abdominal discomfort has resolved.  Vaginal burning as outlined.  Appears to be more external.  Exam as outlined. KOH/wet prep - obtained.  Treat with nystatin cream.  Follow.         Other Visit Diagnoses     Vaginal itching    -  Primary   Relevant Orders   Cervicovaginal ancillary only       [name], MD

## 2016-07-02 ENCOUNTER — Ambulatory Visit: Payer: 59 | Admitting: Pain Medicine

## 2016-07-02 NOTE — Progress Notes (Signed)
Patient's Name: Shannon [name]  MRN: [id number]  Referring Provider: [name], MD  DOB: 02-Aug-1965  PCP: [name], MD  DOS: 07/03/2016  Note by: Kathlen Brunswick. Dossie Arbour, MD  Service setting: Ambulatory outpatient  Specialty: Interventional Pain Management  Location: [facility] (AMB) Pain Management Facility    Patient type: Established   Primary Reason(s) for Visit: Encounter for post-procedure evaluation of chronic illness with mild to moderate exacerbation CC: Back Pain (right buttock)  HPI  Shannon [name] is a 51 y.o. year old, female patient, who comes today for a post-procedure evaluation. She has Hyperthyroidism; GERD (gastroesophageal reflux disease); Anemia; Migraine; Menopausal symptoms; Health care maintenance; Anxiety; Atypical chest pain; Abdominal pain, epigastric; Gastro-esophageal reflux disease without esophagitis; chronic low back pain (Bilateral) (L>R); Lumbar lateral recess stenosis (L4-5) (Left); Lumbar facet hypertrophy (L4-5) (Left); Chronic lumbar radicular pain (Location of Tertiary source of pain) (intermittent) (L4 Dermatome) (Left); Chronic hip pain (Location of Primary Source of Pain) (intermittent) (Left); Chronic sacroiliac joint pain (Location of Secondary source of pain) (intermittent) (Left); Nicotine dependence; Tobacco abuse; Osteoarthritis of hip (bone-on-bone) (Left); Lumbar spondylosis; Encounter for chronic pain management; Post-menopausal bleeding; Axillary lump; Muscle ache; Hemorrhage, postmenopausal; Osteoarthritis of sacroiliac joint (Bilateral) (L>R); Eructation; History of left hip replacement; Primary osteoarthritis of left hip; Pain of right sacroiliac joint; Chronic sacroiliac joint pain (Right); Myofascial pain syndrome (Right Gluteous Muscle); Gluteal pain (Right); Acute vaginitis; and Vaginal burning on her problem list. Her primarily concern today is the Back Pain (right buttock)  Pain Assessment: Self-Reported Pain Score: 1 /10             Reported  level is compatible with observation.       Pain Type: Chronic pain Pain Location: Buttocks Pain Orientation: Right Pain Descriptors / Indicators: Aching Pain Frequency: Intermittent  Shannon [name] comes in today for post-procedure evaluation after the treatment done on 05/26/2016.  Further details on both, my assessment(s), as well as the proposed treatment plan, please see below.  Post-Procedure Assessment  05/26/2016 Procedure: Palliative right intra-articular sacroiliac joint block under fluoroscopic guidance, no sedation Pre-procedure pain score:  3/10 Post-procedure pain score: 3/10 No relief Influential Factors: BMI: 23.05 kg/m Intra-procedural challenges: None observed Assessment challenges: None detected         Post-procedural side-effects, adverse reactions, or complications: None reported Reported issues: None  Sedation: No sedation used. When no sedatives are used, the analgesic levels obtained are directly associated to the effectiveness of the local anesthetics. However, when sedation is provided, the level of analgesia obtained during the initial 1 hour following the intervention, is believed to be the result of a combination of factors. These factors may include, but are not limited to: 1. The effectiveness of the local anesthetics used. 2. The effects of the analgesic(s) and/or anxiolytic(s) used. 3. The degree of discomfort experienced by the patient at the time of the procedure. 4. The patients ability and reliability in recalling and recording the events. 5. The presence and influence of possible secondary gains and/or psychosocial factors. Reported result: Relief experienced during the 1st hour after the procedure: 100 % (Ultra-Short Term Relief) Interpretative annotation: No Analgesic or Anxiolytic given, therefore benefits are completely due to Local Anesthetics.          Effects of local anesthetic: The analgesic effects attained during this period are directly  associated to the localized infiltration of local anesthetics and therefore cary significant diagnostic value as to the etiological location, or anatomical origin, of the pain. Expected duration of  relief is directly dependent on the pharmacodynamics of the local anesthetic used. Long-acting (4-6 hours) anesthetics used.  Reported result: Relief during the next 4 to 6 hour after the procedure: 100 % (Short-Term Relief) Interpretative annotation: Complete relief would suggest area to be the source of the pain.          Long-term benefit: Defined as the period of time past the expected duration of local anesthetics. With the possible exception of prolonged sympathetic blockade from the local anesthetics, benefits during this period are typically attributed to, or associated with, other factors such as analgesic sensory neuropraxia, antiinflammatory effects, or beneficial biochemical changes provided by agents other than the local anesthetics Reported result: Extended relief following procedure: 95 % (Long-Term Relief) Interpretative annotation: Good relief. This could suggest inflammation to be a significant component in the etiology to the pain.          Current benefits: Defined as persistent relief that continues at this point in time.   Reported results: Treated area: 90 %       Interpretative annotation: Ongoing benefits would suggest effective therapeutic approach  Interpretation: Results would suggest a successful diagnostic intervention.          Laboratory Chemistry  Inflammation Markers Lab Results  Component Value Date   ESRSEDRATE 8 05/22/2015   (CRP: Acute Phase) (ESR: Chronic Phase) Renal Function Markers Lab Results  Component Value Date   BUN 17 03/07/2016   CREATININE 0.72 03/07/2016   GFRAA >60 05/02/2014   GFRNONAA >60 05/02/2014   Hepatic Function Markers Lab Results  Component Value Date   AST 17 03/07/2016   ALT 26 03/07/2016   ALBUMIN 4.7 03/07/2016   ALKPHOS  61 03/07/2016   Electrolytes Lab Results  Component Value Date   NA 140 03/07/2016   K 3.7 03/07/2016   CL 104 03/07/2016   CALCIUM 9.9 03/07/2016   Neuropathy Markers No results found for: [id number] Bone Pathology Markers Lab Results  Component Value Date   ALKPHOS 61 03/07/2016   CALCIUM 9.9 03/07/2016   Coagulation Parameters Lab Results  Component Value Date   PLT 274.0 03/07/2016   Cardiovascular Markers Lab Results  Component Value Date   HGB 13.9 03/07/2016   HCT 41.7 03/07/2016   Note: Lab results reviewed.  Recent Diagnostic Imaging Review  Dg C-arm 1-60 Min-no Report  Result Date: 05/26/2016 Fluoroscopy was utilized by the requesting physician.  No radiographic interpretation.   Note: Imaging results reviewed.          Meds  The patient has a current medication list which includes the following prescription(s): acetaminophen, alprazolam, omeprazole, and ranitidine.  Current Outpatient Prescriptions on File Prior to Visit  Medication Sig  . acetaminophen (TYLENOL) 500 MG tablet Take 1,000 mg by mouth every 6 (six) hours as needed for moderate pain.  Marland Kitchen ALPRAZolam (XANAX) 0.25 MG tablet TAKE ONE TABLET AT BEDTIME AS NEEDED FORANXIETY  . omeprazole (PRILOSEC) 40 MG capsule Take 40 mg by mouth daily.  . ranitidine (ZANTAC) 150 MG tablet Take 150 mg by mouth as needed.    No current facility-administered medications on file prior to visit.    ROS  Constitutional: Denies any fever or chills Gastrointestinal: No reported hemesis, hematochezia, vomiting, or acute GI distress Musculoskeletal: Denies any acute onset joint swelling, redness, loss of ROM, or weakness Neurological: No reported episodes of acute onset apraxia, aphasia, dysarthria, agnosia, amnesia, paralysis, loss of coordination, or loss of consciousness  Allergies  Ms. Wilhide is allergic  to codeine; decongest-aid [pseudoephedrine]; flagyl [metronidazole]; and sulfa antibiotics.  [place]  Drug:  Ms. Bober  reports that she does not use drugs. Alcohol:  reports that she does not drink alcohol. Tobacco:  reports that she has quit smoking. She has never used smokeless tobacco. Medical:  has a past medical history of Anemia; Breast pain, right (11/07/2012); Breathing difficulty (06/20/2014); Environmental allergies; GERD (gastroesophageal reflux disease); Hiatal hernia; History of migraine headaches; Hyperthyroidism; and Vaginitis (01/16/2013). Family: family history includes Breast cancer (age of onset: 3) in her mother; Heart disease in her father and maternal grandfather; Hypercholesterolemia in her father and mother; Hypertension in her father; Kidney cancer in her father; Leukemia in her father; Lung cancer in her paternal grandfather; Pancreatic cancer in her paternal grandfather; Prostate cancer in her paternal grandfather; Thyroid disease in her father and mother.  Past Surgical History:  Procedure Laterality Date  . CESAREAN SECTION  11/98   Dr Roena Malady  . CHOLECYSTECTOMY  11/05   Dr Pat Patrick  . JOINT REPLACEMENT Left 2017  . NASAL SINUS SURGERY  11/93  . SEPTOPLASTY  5/90   Dr Rossie Muskrat  . TUBAL LIGATION  02/1998  . UMBILICAL HERNIA REPAIR  02/1998   Constitutional Exam  General appearance: Well nourished, well developed, and well hydrated. In no apparent acute distress Vitals:   07/03/16 1052  BP: 119/76  Pulse: 83  Resp: 16  Temp: 98.5 F (36.9 C)  TempSrc: Oral  SpO2: 99%  Weight: 126 lb (57.2 kg)  Height: '5\' 2"'  (1.575 m)   BMI Assessment: Estimated body mass index is 23.05 kg/m as calculated from the following:   Height as of this encounter: '5\' 2"'  (1.575 m).   Weight as of this encounter: 126 lb (57.2 kg).  BMI interpretation table: BMI level Category Range association with higher incidence of chronic pain  <18 kg/m2 Underweight   18.5-24.9 kg/m2 Ideal body weight   25-29.9 kg/m2 Overweight Increased incidence by 20%  30-34.9 kg/m2 Obese (Class I) Increased  incidence by 68%  35-39.9 kg/m2 Severe obesity (Class II) Increased incidence by 136%  >40 kg/m2 Extreme obesity (Class III) Increased incidence by 254%   BMI Readings from Last 4 Encounters:  07/03/16 23.05 kg/m  07/01/16 23.96 kg/m  06/13/16 24.03 kg/m  05/26/16 24.36 kg/m   Wt Readings from Last 4 Encounters:  07/03/16 126 lb (57.2 kg)  07/01/16 131 lb (59.4 kg)  06/13/16 131 lb 6.4 oz (59.6 kg)  05/26/16 133 lb 3.2 oz (60.4 kg)  Psych/Mental status: Alert, oriented x 3 (person, place, & time)       Eyes: PERLA Respiratory: No evidence of acute respiratory distress  Cervical Spine Exam  Inspection: No masses, redness, or swelling Alignment: Symmetrical Functional ROM: Unrestricted ROM      Stability: No instability detected Muscle strength & Tone: Functionally intact Sensory: Unimpaired Palpation: No palpable anomalies              Upper Extremity (UE) Exam    Side: Right upper extremity  Side: Left upper extremity  Inspection: No masses, redness, swelling, or asymmetry. No contractures  Inspection: No masses, redness, swelling, or asymmetry. No contractures  Functional ROM: Unrestricted ROM          Functional ROM: Unrestricted ROM          Muscle strength & Tone: Functionally intact  Muscle strength & Tone: Functionally intact  Sensory: Unimpaired  Sensory: Unimpaired  Palpation: No palpable anomalies  Palpation: No palpable anomalies              Specialized Test(s): Deferred         Specialized Test(s): Deferred          Thoracic Spine Exam  Inspection: No masses, redness, or swelling Alignment: Symmetrical Functional ROM: Unrestricted ROM Stability: No instability detected Sensory: Unimpaired Muscle strength & Tone: No palpable anomalies  Lumbar Spine Exam  Inspection: No masses, redness, or swelling Alignment: Symmetrical Functional ROM: Unrestricted ROM      Stability: No instability detected Muscle strength & Tone: Functionally  intact Sensory: Unimpaired Palpation: No palpable anomalies       Provocative Tests: Lumbar Hyperextension and rotation test: evaluation deferred today       Patrick's Maneuver: Improved after treatment                    Gait & Posture Assessment  Ambulation: Unassisted Gait: Relatively normal for age and body habitus Posture: WNL   Lower Extremity Exam    Side: Right lower extremity  Side: Left lower extremity  Inspection: No masses, redness, swelling, or asymmetry. No contractures  Inspection: No masses, redness, swelling, or asymmetry. No contractures  Functional ROM: Unrestricted ROM          Functional ROM: Unrestricted ROM          Muscle strength & Tone: Functionally intact  Muscle strength & Tone: Functionally intact  Sensory: Unimpaired  Sensory: Unimpaired  Palpation: No palpable anomalies  Palpation: No palpable anomalies   Assessment  Primary Diagnosis & Pertinent Problem List: The primary encounter diagnosis was Chronic hip pain (Location of Primary Source of Pain) (intermittent) (Left). Diagnoses of Chronic sacroiliac joint pain (Location of Secondary source of pain) (intermittent) (Left), Chronic lumbar radicular pain (Location of Tertiary source of pain) (intermittent) (L4 Dermatome) (Left), Chronic sacroiliac joint pain (Right), and Myofascial pain syndrome (Right Gluteous Muscle) were also pertinent to this visit.  Status Diagnosis  Controlled Controlled Controlled 1. Chronic hip pain (Location of Primary Source of Pain) (intermittent) (Left)   2. Chronic sacroiliac joint pain (Location of Secondary source of pain) (intermittent) (Left)   3. Chronic lumbar radicular pain (Location of Tertiary source of pain) (intermittent) (L4 Dermatome) (Left)   4. Chronic sacroiliac joint pain (Right)   5. Myofascial pain syndrome (Right Gluteous Muscle)     Problems updated and reviewed during this visit: No problems updated. Plan of Care  Pharmacotherapy (Medications  Ordered): No orders of the defined types were placed in this encounter.  New Prescriptions   No medications on file   Medications administered today: Ms. Malachi had no medications administered during this visit. Lab-work, procedure(s), and/or referral(s): Orders Placed This Encounter  Procedures  . SACROILIAC JOINT INJECTINS  . TRIGGER POINT INJECTION   Imaging and/or referral(s): None  Interventional therapies: Planned, scheduled, and/or pending:   Not at this time.   Considering:   Diagnostic right-sided sacroiliac joint block  Palliative right-sided L4-5 lumbar epidural steroid injection  Palliative left-sided sacroiliac joint block    Palliative PRN treatment(s):   Palliative right gluteal MNB Diagnostic right-sided sacroiliac joint block  Palliative right-sided L4-5 lumbar epidural steroid injection  Palliative left-sided sacroiliac joint block    Provider-requested follow-up: Return for PRN procedure(s), by MD.  Future Appointments Date Time Provider [place]  07/21/2016 3:00 PM McGowan, Hunt Oris, PA-C [id number] None  08/25/2016 2:00 PM [name], MD [id number] None   Primary Care Physician: [name], MD  Location: [facility] Note by: Theora Vankirk A. Dossie Arbour, M.D, DABA, DABAPM, DABPM, DABIPP, FIPP Date: 07/03/2016; Time: 11:09 AM  Patient instructions provided during this appointment: Patient Instructions  ____________________________________________________________________________________________  Preparing for your procedure (without sedation) Instructions: . Oral Intake: Do not eat or drink anything for at least 3 hours prior to your procedure. . Transportation: Unless otherwise stated by your physician, you may drive yourself after the procedure. . Blood Pressure Medicine: Take your blood pressure medicine with a sip of water the morning of the procedure. . Blood thinners:  . Diabetics on insulin: Notify the  staff so that you can be scheduled 1st case in the morning. If your diabetes requires high dose insulin, take only  of your normal insulin dose the morning of the procedure and notify the staff that you have done so. . Preventing infections: Shower with an antibacterial soap the morning of your procedure.  . Build-up your immune system: Take 1000 mg of Vitamin C with every meal (3 times a day) the day prior to your procedure. Marland Kitchen Antibiotics: Inform the staff if you have a condition or reason that requires you to take antibiotics before dental procedures. . Pregnancy: If you are pregnant, call and cancel the procedure. . Sickness: If you have a cold, fever, or any active infections, call and cancel the procedure. . Arrival: You must be in the facility at least 30 minutes prior to your scheduled procedure. . Children: Do not bring any children with you. . Dress appropriately: Bring dark clothing that you would not mind if they get stained. . Valuables: Do not bring any jewelry or valuables. Procedure appointments are reserved for interventional treatments only. Marland Kitchen No Prescription Refills. . No medication changes will be discussed during procedure appointments. . No disability issues will be discussed. ____________________________________________________________________________________________

## 2016-07-03 ENCOUNTER — Encounter: Payer: Self-pay | Admitting: Pain Medicine

## 2016-07-03 ENCOUNTER — Ambulatory Visit: Payer: 59 | Attending: Pain Medicine | Admitting: Pain Medicine

## 2016-07-03 ENCOUNTER — Other Ambulatory Visit: Payer: Self-pay

## 2016-07-03 ENCOUNTER — Encounter: Payer: Self-pay | Admitting: Internal Medicine

## 2016-07-03 VITALS — BP 119/76 | HR 83 | Temp 98.5°F | Resp 16 | Ht 62.0 in | Wt 126.0 lb

## 2016-07-03 DIAGNOSIS — M25552 Pain in left hip: Secondary | ICD-10-CM | POA: Diagnosis not present

## 2016-07-03 DIAGNOSIS — N949 Unspecified condition associated with female genital organs and menstrual cycle: Secondary | ICD-10-CM | POA: Insufficient documentation

## 2016-07-03 DIAGNOSIS — M545 Low back pain: Secondary | ICD-10-CM | POA: Insufficient documentation

## 2016-07-03 DIAGNOSIS — F419 Anxiety disorder, unspecified: Secondary | ICD-10-CM | POA: Insufficient documentation

## 2016-07-03 DIAGNOSIS — M5416 Radiculopathy, lumbar region: Secondary | ICD-10-CM

## 2016-07-03 DIAGNOSIS — G8929 Other chronic pain: Secondary | ICD-10-CM | POA: Diagnosis not present

## 2016-07-03 DIAGNOSIS — M1612 Unilateral primary osteoarthritis, left hip: Secondary | ICD-10-CM | POA: Diagnosis not present

## 2016-07-03 DIAGNOSIS — M4726 Other spondylosis with radiculopathy, lumbar region: Secondary | ICD-10-CM | POA: Insufficient documentation

## 2016-07-03 DIAGNOSIS — E0590 Thyrotoxicosis, unspecified without thyrotoxic crisis or storm: Secondary | ICD-10-CM | POA: Insufficient documentation

## 2016-07-03 DIAGNOSIS — M48061 Spinal stenosis, lumbar region without neurogenic claudication: Secondary | ICD-10-CM | POA: Insufficient documentation

## 2016-07-03 DIAGNOSIS — G43909 Migraine, unspecified, not intractable, without status migrainosus: Secondary | ICD-10-CM | POA: Diagnosis not present

## 2016-07-03 DIAGNOSIS — M549 Dorsalgia, unspecified: Secondary | ICD-10-CM | POA: Diagnosis present

## 2016-07-03 DIAGNOSIS — Z87891 Personal history of nicotine dependence: Secondary | ICD-10-CM | POA: Diagnosis not present

## 2016-07-03 DIAGNOSIS — D649 Anemia, unspecified: Secondary | ICD-10-CM | POA: Diagnosis not present

## 2016-07-03 DIAGNOSIS — M791 Myalgia: Secondary | ICD-10-CM

## 2016-07-03 DIAGNOSIS — K219 Gastro-esophageal reflux disease without esophagitis: Secondary | ICD-10-CM | POA: Insufficient documentation

## 2016-07-03 DIAGNOSIS — Z96642 Presence of left artificial hip joint: Secondary | ICD-10-CM | POA: Diagnosis not present

## 2016-07-03 DIAGNOSIS — M533 Sacrococcygeal disorders, not elsewhere classified: Secondary | ICD-10-CM | POA: Diagnosis not present

## 2016-07-03 DIAGNOSIS — K449 Diaphragmatic hernia without obstruction or gangrene: Secondary | ICD-10-CM | POA: Diagnosis not present

## 2016-07-03 DIAGNOSIS — M7918 Myalgia, other site: Secondary | ICD-10-CM

## 2016-07-03 DIAGNOSIS — N950 Postmenopausal bleeding: Secondary | ICD-10-CM | POA: Insufficient documentation

## 2016-07-03 LAB — CERVICOVAGINAL ANCILLARY ONLY: WET PREP (BD AFFIRM): POSITIVE — AB

## 2016-07-03 MED ORDER — FLUCONAZOLE 150 MG PO TABS: 150.0000 mg | ORAL_TABLET | Freq: Once | ORAL | 0 refills | Status: AC

## 2016-07-03 NOTE — Patient Instructions (Signed)
____________________________________________________________________________________________  Preparing for your procedure (without sedation) Instructions: . Oral Intake: Do not eat or drink anything for at least 3 hours prior to your procedure. . Transportation: Unless otherwise stated by your physician, you may drive yourself after the procedure. . Blood Pressure Medicine: Take your blood pressure medicine with a sip of water the morning of the procedure. . Blood thinners:  . Diabetics on insulin: Notify the staff so that you can be scheduled 1st case in the morning. If your diabetes requires high dose insulin, take only  of your normal insulin dose the morning of the procedure and notify the staff that you have done so. . Preventing infections: Shower with an antibacterial soap the morning of your procedure.  . Build-up your immune system: Take 1000 mg of Vitamin C with every meal (3 times a day) the day prior to your procedure. . Antibiotics: Inform the staff if you have a condition or reason that requires you to take antibiotics before dental procedures. . Pregnancy: If you are pregnant, call and cancel the procedure. . Sickness: If you have a cold, fever, or any active infections, call and cancel the procedure. . Arrival: You must be in the facility at least 30 minutes prior to your scheduled procedure. . Children: Do not bring any children with you. . Dress appropriately: Bring dark clothing that you would not mind if they get stained. . Valuables: Do not bring any jewelry or valuables. Procedure appointments are reserved for interventional treatments only. . No Prescription Refills. . No medication changes will be discussed during procedure appointments. . No disability issues will be discussed. ____________________________________________________________________________________________   

## 2016-07-03 NOTE — Progress Notes (Signed)
Safety precautions to be maintained throughout the outpatient stay will include: orient to surroundings, keep bed in low position, maintain call bell within reach at all times, provide assistance with transfer out of bed and ambulation.  

## 2016-07-03 NOTE — Assessment & Plan Note (Signed)
The previous dysuria and abdominal discomfort has resolved.  Vaginal burning as outlined.  Appears to be more external.  Exam as outlined. KOH/wet prep - obtained.  Treat with nystatin cream.  Follow.

## 2016-07-09 ENCOUNTER — Other Ambulatory Visit: Payer: Self-pay | Admitting: Internal Medicine

## 2016-07-10 DIAGNOSIS — J3089 Other allergic rhinitis: Secondary | ICD-10-CM | POA: Diagnosis not present

## 2016-07-10 DIAGNOSIS — J301 Allergic rhinitis due to pollen: Secondary | ICD-10-CM | POA: Diagnosis not present

## 2016-07-15 DIAGNOSIS — J3089 Other allergic rhinitis: Secondary | ICD-10-CM | POA: Diagnosis not present

## 2016-07-15 DIAGNOSIS — J301 Allergic rhinitis due to pollen: Secondary | ICD-10-CM | POA: Diagnosis not present

## 2016-07-20 NOTE — Progress Notes (Signed)
07/21/2016 3:25 PM   Shannon [name] Nov 23, 1965 [id number]  Referring provider: [name], [address] [place],  [address]  Chief Complaint  Patient presents with  . Dysuria    new patient referred by Dr. [name]  . Urinary Frequency    HPI: Patient is a 51 year old Caucasian female who is referred by Dr. [name] for dysuria and frequency.    In May, she started with urgency, frequency, suprapubic pain and dribbling.  She was given Augmentin.  Did not get better.  Then was given something for vaginitis.  Then she was given something for a yeast infection.  She states now she is symptom free.  Her UA's, urine cultures, CG/Chlamydia cultures were negative.  Wet prep was positive for yeast.    She denies dysuria, gross hematuria, suprapubic pain, back pain, abdominal pain or flank pain.  She has not had any recent fevers, chills, nausea or vomiting.   She does not have a history of nephrolithiasis, GU surgery or GU trauma.   She is sexually active.  She is having pain with sexual activity.  She is postmenopausal.   She denies constipation and/or diarrhea.   She has not had any recent imaging studies.     She is drinking 5 to 6 bottles of water daily.  2-3 cups of decaf daily.  Some milk with cereal.  No juices.  No soft  drinks.    PMH: Past Medical History:  Diagnosis Date  . Anemia   . Anxiety   . Breast pain, right 11/07/2012  . Breathing difficulty 06/20/2014  . Environmental allergies   . GERD (gastroesophageal reflux disease)   . Hiatal hernia    small  . History of migraine headaches   . Hyperthyroidism    s/p ablation  . Vaginitis 01/16/2013    Surgical History: Past Surgical History:  Procedure Laterality Date  . CESAREAN SECTION  11/98   Dr Roena Malady  . CHOLECYSTECTOMY  11/05   Dr Pat Patrick  . JOINT REPLACEMENT Left 2017   hip  . NASAL SINUS SURGERY  11/93  . SEPTOPLASTY  5/90   Dr Rossie Muskrat  . TUBAL  LIGATION  02/1998  . UMBILICAL HERNIA REPAIR  02/1998    Home Medications:  Allergies as of 07/21/2016      Reactions   Codeine Other (See Comments)   Severe Headache   Decongest-aid [pseudoephedrine] Other (See Comments)   Increased heart rate   Flagyl [metronidazole]    Sulfa Antibiotics       Medication List       Accurate as of 07/21/16  3:25 PM. Always use your most recent med list.          acetaminophen 500 MG tablet Commonly known as:  TYLENOL Take 1,000 mg by mouth every 6 (six) hours as needed for moderate pain.   ALPRAZolam 0.25 MG tablet Commonly known as:  XANAX TAKE ONE TABLET BY MOUTH EVERY NIGHT AT BEDTIME FOR ANXIETY   omeprazole 40 MG capsule Commonly known as:  PRILOSEC Take 40 mg by mouth daily.   ranitidine 150 MG tablet Commonly known as:  ZANTAC Take 150 mg by mouth as needed.       Allergies:  Allergies  Allergen Reactions  . Codeine Other (See Comments)    Severe Headache  . Decongest-Aid [Pseudoephedrine] Other (See Comments)    Increased heart rate  . Flagyl [Metronidazole]   . Sulfa Antibiotics  Family History: Family History  Problem Relation Age of Onset  . Heart disease Father   . Hypertension Father   . Thyroid disease Father   . Hypercholesterolemia Father   . Kidney cancer Father   . Leukemia Father        hairy cell  . Hypercholesterolemia Mother   . Thyroid disease Mother   . Breast cancer Mother 36  . Breast cancer Unknown        maternal great grandmother  . Heart disease Maternal Grandfather        MI - age 53  . Prostate cancer Paternal Grandfather   . Pancreatic cancer Paternal Grandfather   . Lung cancer Paternal Grandfather   . Bladder Cancer Neg Hx     Social History:  reports that she quit smoking about 16 years ago. She has never used smokeless tobacco. She reports that she does not drink alcohol or use drugs.  ROS: UROLOGY Frequent Urination?: Yes Hard to postpone urination?:  No Burning/pain with urination?: No Get up at night to urinate?: No Leakage of urine?: No Urine stream starts and stops?: No Trouble starting stream?: No Do you have to strain to urinate?: No Blood in urine?: No Urinary tract infection?: No Sexually transmitted disease?: No Injury to kidneys or bladder?: No Painful intercourse?: No Weak stream?: No Currently pregnant?: No Vaginal bleeding?: No Last menstrual period?: n  Gastrointestinal Nausea?: No Vomiting?: No Indigestion/heartburn?: No Diarrhea?: No Constipation?: No  Constitutional Fever: No Night sweats?: No Weight loss?: No Fatigue?: No  Skin Skin rash/lesions?: No Itching?: No  Eyes Blurred vision?: No Double vision?: No  Ears/Nose/Throat Sore throat?: No Sinus problems?: No  Hematologic/Lymphatic Swollen glands?: No Easy bruising?: No  Cardiovascular Leg swelling?: No Chest pain?: No  Respiratory Cough?: No Shortness of breath?: No  Endocrine Excessive thirst?: No  Musculoskeletal Back pain?: No Joint pain?: No  Neurological Headaches?: No Dizziness?: No  Psychologic Depression?: No Anxiety?: Yes  Physical Exam: BP 111/75   Pulse 92   Ht 5\' 2"  (1.575 m)   Wt 132 lb (59.9 kg)   LMP 02/12/2013   BMI 24.14 kg/m   Constitutional: Well nourished. Alert and oriented, No acute distress. HEENT: [place] AT, moist mucus membranes. Trachea midline, no masses. Cardiovascular: No clubbing, cyanosis, or edema. Respiratory: Normal respiratory effort, no increased work of breathing. GI: Abdomen is soft, non tender, non distended, no abdominal masses. Liver and spleen not palpable.  No hernias appreciated.  Stool sample for occult testing is not indicated.   GU: No CVA tenderness.  No bladder fullness or masses.  Atrophic external genitalia, normal pubic hair distribution, no lesions.  Normal urethral meatus, no lesions, no prolapse, no discharge.   No urethral masses, tenderness and/or  tenderness. No bladder fullness, tenderness or masses. Pale vagina mucosa, poor estrogen effect, no discharge, no lesions, good pelvic support, no cystocele or rectocele noted.  No cervical motion tenderness.  Uterus is freely mobile and non-fixed.  No adnexal/parametria masses or tenderness noted.  Anus and perineum are without rashes or lesions.    Skin: No rashes, bruises or suspicious lesions. Lymph: No cervical or inguinal adenopathy. Neurologic: Grossly intact, no focal deficits, moving all 4 extremities. Psychiatric: Normal mood and affect.  Laboratory Data: Lab Results  Component Value Date   WBC 8.0 03/07/2016   HGB 13.9 03/07/2016   HCT 41.7 03/07/2016   MCV 90.1 03/07/2016   PLT 274.0 03/07/2016    Lab Results  Component Value Date  CREATININE 0.72 03/07/2016    Lab Results  Component Value Date   TSH 1.80 03/07/2016       Component Value Date/Time   CHOL 211 (H) 03/07/2016 0854   HDL 80.10 03/07/2016 0854   CHOLHDL 3 03/07/2016 0854   VLDL 28.4 03/07/2016 0854   LDLCALC 103 (H) 03/07/2016 0854    Lab Results  Component Value Date   AST 17 03/07/2016   Lab Results  Component Value Date   ALT 26 03/07/2016      Pertinent Imaging: Results for KIERSTYN, BARANOWSKI (MRN [id number]) as of 07/21/2016 15:06  Ref. Range 07/21/2016 14:52  Scan Result Unknown 31    Assessment & Plan:    1. Dysuria  - currently resolved  - ? Yeast infection  - patient will follow up if symptoms reoccur   2. Suprapubic pain  - currently resolved  - ? Yeast infection  3. Atrophic vaginitis  - patient is reluctant to start vaginal estrogen cream as her mother had breast cancer and she had intense PMS migraines  Return if symptoms worsen or fail to improve.  These notes generated with voice recognition software. I apologize for typographical errors.  Zara Council, [facility] [address], [place], [place] [address] [phone]

## 2016-07-21 ENCOUNTER — Encounter: Payer: Self-pay | Admitting: Urology

## 2016-07-21 ENCOUNTER — Ambulatory Visit (INDEPENDENT_AMBULATORY_CARE_PROVIDER_SITE_OTHER): Payer: 59 | Admitting: Urology

## 2016-07-21 VITALS — BP 111/75 | HR 92 | Ht 62.0 in | Wt 132.0 lb

## 2016-07-21 DIAGNOSIS — R350 Frequency of micturition: Secondary | ICD-10-CM

## 2016-07-21 DIAGNOSIS — R102 Pelvic and perineal pain: Secondary | ICD-10-CM

## 2016-07-21 DIAGNOSIS — N952 Postmenopausal atrophic vaginitis: Secondary | ICD-10-CM | POA: Diagnosis not present

## 2016-07-21 DIAGNOSIS — R300 Dysuria: Secondary | ICD-10-CM

## 2016-07-21 LAB — BLADDER SCAN AMB NON-IMAGING: Scan Result: 31

## 2016-07-21 NOTE — Patient Instructions (Signed)

## 2016-07-24 DIAGNOSIS — J301 Allergic rhinitis due to pollen: Secondary | ICD-10-CM | POA: Diagnosis not present

## 2016-07-24 DIAGNOSIS — J3089 Other allergic rhinitis: Secondary | ICD-10-CM | POA: Diagnosis not present

## 2016-07-29 DIAGNOSIS — J3089 Other allergic rhinitis: Secondary | ICD-10-CM | POA: Diagnosis not present

## 2016-07-29 DIAGNOSIS — J301 Allergic rhinitis due to pollen: Secondary | ICD-10-CM | POA: Diagnosis not present

## 2016-08-04 DIAGNOSIS — R140 Abdominal distension (gaseous): Secondary | ICD-10-CM | POA: Diagnosis not present

## 2016-08-04 DIAGNOSIS — K219 Gastro-esophageal reflux disease without esophagitis: Secondary | ICD-10-CM | POA: Diagnosis not present

## 2016-08-05 DIAGNOSIS — J301 Allergic rhinitis due to pollen: Secondary | ICD-10-CM | POA: Diagnosis not present

## 2016-08-05 DIAGNOSIS — J3089 Other allergic rhinitis: Secondary | ICD-10-CM | POA: Diagnosis not present

## 2016-08-05 NOTE — Telephone Encounter (Signed)
NA

## 2016-08-14 DIAGNOSIS — J3089 Other allergic rhinitis: Secondary | ICD-10-CM | POA: Diagnosis not present

## 2016-08-14 DIAGNOSIS — J301 Allergic rhinitis due to pollen: Secondary | ICD-10-CM | POA: Diagnosis not present

## 2016-08-14 DIAGNOSIS — R05 Cough: Secondary | ICD-10-CM | POA: Diagnosis not present

## 2016-08-14 DIAGNOSIS — H1045 Other chronic allergic conjunctivitis: Secondary | ICD-10-CM | POA: Diagnosis not present

## 2016-08-19 DIAGNOSIS — L570 Actinic keratosis: Secondary | ICD-10-CM | POA: Diagnosis not present

## 2016-08-19 DIAGNOSIS — D225 Melanocytic nevi of trunk: Secondary | ICD-10-CM | POA: Diagnosis not present

## 2016-08-22 DIAGNOSIS — J301 Allergic rhinitis due to pollen: Secondary | ICD-10-CM | POA: Diagnosis not present

## 2016-08-22 DIAGNOSIS — J3089 Other allergic rhinitis: Secondary | ICD-10-CM | POA: Diagnosis not present

## 2016-08-25 ENCOUNTER — Other Ambulatory Visit: Payer: Self-pay | Admitting: Internal Medicine

## 2016-08-25 ENCOUNTER — Encounter: Payer: Self-pay | Admitting: Internal Medicine

## 2016-08-25 ENCOUNTER — Ambulatory Visit (INDEPENDENT_AMBULATORY_CARE_PROVIDER_SITE_OTHER): Payer: 59 | Admitting: Internal Medicine

## 2016-08-25 VITALS — BP 118/76 | HR 78 | Temp 98.6°F | Resp 12 | Ht 62.0 in | Wt 130.2 lb

## 2016-08-25 DIAGNOSIS — M1612 Unilateral primary osteoarthritis, left hip: Secondary | ICD-10-CM | POA: Diagnosis not present

## 2016-08-25 DIAGNOSIS — E0590 Thyrotoxicosis, unspecified without thyrotoxic crisis or storm: Secondary | ICD-10-CM

## 2016-08-25 DIAGNOSIS — F419 Anxiety disorder, unspecified: Secondary | ICD-10-CM | POA: Diagnosis not present

## 2016-08-25 DIAGNOSIS — Z13220 Encounter for screening for lipoid disorders: Secondary | ICD-10-CM

## 2016-08-25 DIAGNOSIS — Z1231 Encounter for screening mammogram for malignant neoplasm of breast: Secondary | ICD-10-CM

## 2016-08-25 DIAGNOSIS — K219 Gastro-esophageal reflux disease without esophagitis: Secondary | ICD-10-CM

## 2016-08-25 MED ORDER — ALPRAZOLAM 0.25 MG PO TABS: ORAL_TABLET | ORAL | 1 refills | Status: DC

## 2016-08-25 NOTE — Assessment & Plan Note (Signed)
S/p ablation.  Follow tsh.  

## 2016-08-25 NOTE — Assessment & Plan Note (Signed)
S/p left total hip arthroplasty.  Doing well.  Continue f/u with ortho.

## 2016-08-25 NOTE — Assessment & Plan Note (Signed)
Takes xanax prn.  Follow.  Overall stable.

## 2016-08-25 NOTE — Assessment & Plan Note (Signed)
Sees GI regularly.  Just evaluated.  Omeprazole was decreased to 20mg  q day.  She did not tolerate.  Back on 40mg  q day now.  Controls if she watches what she eats.  Continues f/u with GI.  Rarely takes zantac.

## 2016-08-25 NOTE — Progress Notes (Signed)
Pre-visit discussion using our clinic review tool. No additional management support is needed unless otherwise documented below in the visit note.  

## 2016-08-25 NOTE — Progress Notes (Signed)
Patient ID: Shannon [name], female   DOB: 03-02-1965, 51 y.o.   MRN: [id number]   Subjective:    Patient ID: Shannon [name], female    DOB: 1965-12-16, 51 y.o.   MRN: [id number]  HPI  Patient here for a scheduled follow up.  She is doing well.  Her vaginal/GU issues have resolved.  She saw urology.  No changes made.  Resolved.  She is exercising.  Walking.  Hip is doing well.  No chest pain.  No sob.  No acid reflux.  No abdominal pain.  Bowels moving.  She tried decreasing her omeprazole to 20mg  q day.  Had increased acid reflux after two days.  Back on 40mg  q day and if she watches what she eats - this controls.  Overall feels good.  Handling stress relatively well.     Past Medical History:  Diagnosis Date  . Anemia   . Anxiety   . Breast pain, right 11/07/2012  . Breathing difficulty 06/20/2014  . Environmental allergies   . GERD (gastroesophageal reflux disease)   . Hiatal hernia    small  . History of migraine headaches   . Hyperthyroidism    s/p ablation  . Vaginitis 01/16/2013   Past Surgical History:  Procedure Laterality Date  . CESAREAN SECTION  11/98   Dr Roena Malady  . CHOLECYSTECTOMY  11/05   Dr Pat Patrick  . JOINT REPLACEMENT Left 2017   hip  . NASAL SINUS SURGERY  11/93  . SEPTOPLASTY  5/90   Dr Rossie Muskrat  . TUBAL LIGATION  02/1998  . UMBILICAL HERNIA REPAIR  02/1998   Family History  Problem Relation Age of Onset  . Heart disease Father   . Hypertension Father   . Thyroid disease Father   . Hypercholesterolemia Father   . Kidney cancer Father   . Leukemia Father        hairy cell  . Hypercholesterolemia Mother   . Thyroid disease Mother   . Breast cancer Mother 50  . Breast cancer Unknown        maternal great grandmother  . Heart disease Maternal Grandfather        MI - age 41  . Prostate cancer Paternal Grandfather   . Pancreatic cancer Paternal Grandfather   . Lung cancer Paternal Grandfather   . Bladder Cancer Neg Hx    Social History   Social  History  . Marital status: Married    Spouse name: N/A  . Number of children: 1  . Years of education: N/A   Social History Main Topics  . Smoking status: Former Smoker    Quit date: 01/28/2000  . Smokeless tobacco: Never Used  . Alcohol use No  . Drug use: No  . Sexual activity: Not Asked   Other Topics Concern  . None   Social History Narrative  . None    Outpatient Encounter Prescriptions as of 08/25/2016  Medication Sig  . acetaminophen (TYLENOL) 500 MG tablet Take 1,000 mg by mouth every 6 (six) hours as needed for moderate pain.  Marland Kitchen ALPRAZolam (XANAX) 0.25 MG tablet TAKE ONE TABLET BY MOUTH EVERY NIGHT AT BEDTIME FOR ANXIETY  . omeprazole (PRILOSEC) 40 MG capsule Take 40 mg by mouth daily.  . ranitidine (ZANTAC) 150 MG tablet Take 150 mg by mouth as needed.   . [DISCONTINUED] ALPRAZolam (XANAX) 0.25 MG tablet TAKE ONE TABLET BY MOUTH EVERY NIGHT AT BEDTIME FOR ANXIETY   No facility-administered encounter medications on file as  of 08/25/2016.     Review of Systems  Constitutional: Negative for appetite change and unexpected weight change.  HENT: Negative for congestion and sinus pressure.        Seeing an allergist for her allergy issues - controlled.   Respiratory: Negative for cough, chest tightness and shortness of breath.   Cardiovascular: Negative for chest pain, palpitations and leg swelling.  Gastrointestinal: Negative for abdominal pain, diarrhea, nausea and vomiting.  Genitourinary: Negative for difficulty urinating and dysuria.  Musculoskeletal: Negative for joint swelling and myalgias.       Hip is better.    Skin: Negative for color change and rash.  Neurological: Negative for dizziness, light-headedness and headaches.  Psychiatric/Behavioral: Negative for agitation and dysphoric mood.       Objective:     Blood pressure rechecked by me:  130/78  Physical Exam  Constitutional: She appears well-developed and well-nourished. No distress.  HENT:  Nose:  Nose normal.  Mouth/Throat: Oropharynx is clear and moist.  Neck: Neck supple. No thyromegaly present.  Cardiovascular: Normal rate and regular rhythm.   Pulmonary/Chest: Breath sounds normal. No respiratory distress. She has no wheezes.  Abdominal: Soft. Bowel sounds are normal. There is no tenderness.  Musculoskeletal: She exhibits no edema or tenderness.  Lymphadenopathy:    She has no cervical adenopathy.  Skin: No rash noted. No erythema.  Psychiatric: She has a normal mood and affect. Her behavior is normal.    BP 118/76 (BP Location: Left Arm, Patient Position: Sitting, Cuff Size: Normal)   Pulse 78   Temp 98.6 F (37 C) (Oral)   Resp 12   Ht 5\' 2"  (1.575 m)   Wt 130 lb 3.2 oz (59.1 kg)   LMP 02/12/2013   BMI 23.81 kg/m  Wt Readings from Last 3 Encounters:  08/25/16 130 lb 3.2 oz (59.1 kg)  07/21/16 132 lb (59.9 kg)  07/03/16 126 lb (57.2 kg)     Lab Results  Component Value Date   WBC 8.0 03/07/2016   HGB 13.9 03/07/2016   HCT 41.7 03/07/2016   PLT 274.0 03/07/2016   GLUCOSE 96 03/07/2016   CHOL 211 (H) 03/07/2016   TRIG 142.0 03/07/2016   HDL 80.10 03/07/2016   LDLCALC 103 (H) 03/07/2016   ALT 26 03/07/2016   AST 17 03/07/2016   NA 140 03/07/2016   K 3.7 03/07/2016   CL 104 03/07/2016   CREATININE 0.72 03/07/2016   BUN 17 03/07/2016   CO2 28 03/07/2016   TSH 1.80 03/07/2016    Dg C-arm 1-60 Min-no Report  Result Date: 05/26/2016 Fluoroscopy was utilized by the requesting physician.  No radiographic interpretation.       Assessment & Plan:   Problem List Items Addressed This Visit    Anxiety    Takes xanax prn.  Follow.  Overall stable.        Relevant Medications   ALPRAZolam (XANAX) 0.25 MG tablet   GERD (gastroesophageal reflux disease)    Sees GI regularly.  Just evaluated.  Omeprazole was decreased to 20mg  q day.  She did not tolerate.  Back on 40mg  q day now.  Controls if she watches what she eats.  Continues f/u with GI.  Rarely takes  zantac.        Relevant Orders   CBC with Differential/Platelet   Comprehensive metabolic panel   Hyperthyroidism    S/p ablation.  Follow tsh.        Relevant Orders   TSH  Osteoarthritis of hip (bone-on-bone) (Left) (Chronic)    S/p left total hip arthroplasty.  Doing well.  Continue f/u with ortho.         Other Visit Diagnoses    Screening cholesterol level    -  Primary   Relevant Orders   Lipid panel       [name], MD

## 2016-08-28 DIAGNOSIS — J3089 Other allergic rhinitis: Secondary | ICD-10-CM | POA: Diagnosis not present

## 2016-08-28 DIAGNOSIS — J3081 Allergic rhinitis due to animal (cat) (dog) hair and dander: Secondary | ICD-10-CM | POA: Diagnosis not present

## 2016-08-28 DIAGNOSIS — J301 Allergic rhinitis due to pollen: Secondary | ICD-10-CM | POA: Diagnosis not present

## 2016-09-04 DIAGNOSIS — J301 Allergic rhinitis due to pollen: Secondary | ICD-10-CM | POA: Diagnosis not present

## 2016-09-04 DIAGNOSIS — J3089 Other allergic rhinitis: Secondary | ICD-10-CM | POA: Diagnosis not present

## 2016-09-11 DIAGNOSIS — J301 Allergic rhinitis due to pollen: Secondary | ICD-10-CM | POA: Diagnosis not present

## 2016-09-11 DIAGNOSIS — J3089 Other allergic rhinitis: Secondary | ICD-10-CM | POA: Diagnosis not present

## 2016-09-18 DIAGNOSIS — J301 Allergic rhinitis due to pollen: Secondary | ICD-10-CM | POA: Diagnosis not present

## 2016-09-18 DIAGNOSIS — J3089 Other allergic rhinitis: Secondary | ICD-10-CM | POA: Diagnosis not present

## 2016-09-25 DIAGNOSIS — J301 Allergic rhinitis due to pollen: Secondary | ICD-10-CM | POA: Diagnosis not present

## 2016-09-25 DIAGNOSIS — J3089 Other allergic rhinitis: Secondary | ICD-10-CM | POA: Diagnosis not present

## 2016-10-02 DIAGNOSIS — J301 Allergic rhinitis due to pollen: Secondary | ICD-10-CM | POA: Diagnosis not present

## 2016-10-02 DIAGNOSIS — J3089 Other allergic rhinitis: Secondary | ICD-10-CM | POA: Diagnosis not present

## 2016-10-03 DIAGNOSIS — Z23 Encounter for immunization: Secondary | ICD-10-CM | POA: Diagnosis not present

## 2016-10-07 DIAGNOSIS — J3089 Other allergic rhinitis: Secondary | ICD-10-CM | POA: Diagnosis not present

## 2016-10-07 DIAGNOSIS — J301 Allergic rhinitis due to pollen: Secondary | ICD-10-CM | POA: Diagnosis not present

## 2016-10-16 DIAGNOSIS — J3089 Other allergic rhinitis: Secondary | ICD-10-CM | POA: Diagnosis not present

## 2016-10-16 DIAGNOSIS — J301 Allergic rhinitis due to pollen: Secondary | ICD-10-CM | POA: Diagnosis not present

## 2016-10-23 DIAGNOSIS — J3089 Other allergic rhinitis: Secondary | ICD-10-CM | POA: Diagnosis not present

## 2016-10-23 DIAGNOSIS — J301 Allergic rhinitis due to pollen: Secondary | ICD-10-CM | POA: Diagnosis not present

## 2016-10-30 DIAGNOSIS — J301 Allergic rhinitis due to pollen: Secondary | ICD-10-CM | POA: Diagnosis not present

## 2016-10-30 DIAGNOSIS — J3089 Other allergic rhinitis: Secondary | ICD-10-CM | POA: Diagnosis not present

## 2016-11-05 ENCOUNTER — Other Ambulatory Visit: Payer: Self-pay | Admitting: Internal Medicine

## 2016-11-07 ENCOUNTER — Other Ambulatory Visit: Payer: Self-pay | Admitting: Internal Medicine

## 2016-11-07 DIAGNOSIS — J3089 Other allergic rhinitis: Secondary | ICD-10-CM | POA: Diagnosis not present

## 2016-11-07 DIAGNOSIS — J301 Allergic rhinitis due to pollen: Secondary | ICD-10-CM | POA: Diagnosis not present

## 2016-11-07 NOTE — Telephone Encounter (Signed)
Last appt was 08/25/2016, last refill was

## 2016-11-10 DIAGNOSIS — M1612 Unilateral primary osteoarthritis, left hip: Secondary | ICD-10-CM | POA: Diagnosis not present

## 2016-11-10 DIAGNOSIS — Z96642 Presence of left artificial hip joint: Secondary | ICD-10-CM | POA: Diagnosis not present

## 2016-11-13 DIAGNOSIS — J301 Allergic rhinitis due to pollen: Secondary | ICD-10-CM | POA: Diagnosis not present

## 2016-11-13 DIAGNOSIS — J3089 Other allergic rhinitis: Secondary | ICD-10-CM | POA: Diagnosis not present

## 2016-11-14 DIAGNOSIS — J301 Allergic rhinitis due to pollen: Secondary | ICD-10-CM | POA: Diagnosis not present

## 2016-11-20 DIAGNOSIS — J3089 Other allergic rhinitis: Secondary | ICD-10-CM | POA: Diagnosis not present

## 2016-11-20 DIAGNOSIS — J301 Allergic rhinitis due to pollen: Secondary | ICD-10-CM | POA: Diagnosis not present

## 2016-11-27 DIAGNOSIS — J3089 Other allergic rhinitis: Secondary | ICD-10-CM | POA: Diagnosis not present

## 2016-11-27 DIAGNOSIS — J301 Allergic rhinitis due to pollen: Secondary | ICD-10-CM | POA: Diagnosis not present

## 2016-12-02 ENCOUNTER — Ambulatory Visit
Admission: RE | Admit: 2016-12-02 | Discharge: 2016-12-02 | Disposition: A | Discharge status: Home / Self Care | Payer: 59 | Source: Ambulatory Visit | Attending: Internal Medicine | Admitting: Internal Medicine

## 2016-12-02 DIAGNOSIS — Z1231 Encounter for screening mammogram for malignant neoplasm of breast: Secondary | ICD-10-CM | POA: Insufficient documentation

## 2016-12-04 DIAGNOSIS — J301 Allergic rhinitis due to pollen: Secondary | ICD-10-CM | POA: Diagnosis not present

## 2016-12-04 DIAGNOSIS — J3089 Other allergic rhinitis: Secondary | ICD-10-CM | POA: Diagnosis not present

## 2016-12-11 DIAGNOSIS — J301 Allergic rhinitis due to pollen: Secondary | ICD-10-CM | POA: Diagnosis not present

## 2016-12-11 DIAGNOSIS — J3089 Other allergic rhinitis: Secondary | ICD-10-CM | POA: Diagnosis not present

## 2016-12-16 DIAGNOSIS — J3089 Other allergic rhinitis: Secondary | ICD-10-CM | POA: Diagnosis not present

## 2016-12-16 DIAGNOSIS — J301 Allergic rhinitis due to pollen: Secondary | ICD-10-CM | POA: Diagnosis not present

## 2016-12-25 DIAGNOSIS — J3089 Other allergic rhinitis: Secondary | ICD-10-CM | POA: Diagnosis not present

## 2016-12-25 DIAGNOSIS — J301 Allergic rhinitis due to pollen: Secondary | ICD-10-CM | POA: Diagnosis not present

## 2017-01-01 DIAGNOSIS — J301 Allergic rhinitis due to pollen: Secondary | ICD-10-CM | POA: Diagnosis not present

## 2017-01-01 DIAGNOSIS — J3089 Other allergic rhinitis: Secondary | ICD-10-CM | POA: Diagnosis not present

## 2017-01-06 DIAGNOSIS — J301 Allergic rhinitis due to pollen: Secondary | ICD-10-CM | POA: Diagnosis not present

## 2017-01-06 DIAGNOSIS — J3089 Other allergic rhinitis: Secondary | ICD-10-CM | POA: Diagnosis not present

## 2017-01-13 DIAGNOSIS — J301 Allergic rhinitis due to pollen: Secondary | ICD-10-CM | POA: Diagnosis not present

## 2017-01-13 DIAGNOSIS — J3089 Other allergic rhinitis: Secondary | ICD-10-CM | POA: Diagnosis not present

## 2017-01-15 DIAGNOSIS — J3089 Other allergic rhinitis: Secondary | ICD-10-CM | POA: Diagnosis not present

## 2017-01-22 DIAGNOSIS — J301 Allergic rhinitis due to pollen: Secondary | ICD-10-CM | POA: Diagnosis not present

## 2017-01-22 DIAGNOSIS — J3089 Other allergic rhinitis: Secondary | ICD-10-CM | POA: Diagnosis not present

## 2017-01-30 DIAGNOSIS — J301 Allergic rhinitis due to pollen: Secondary | ICD-10-CM | POA: Diagnosis not present

## 2017-01-30 DIAGNOSIS — J3089 Other allergic rhinitis: Secondary | ICD-10-CM | POA: Diagnosis not present

## 2017-02-02 ENCOUNTER — Other Ambulatory Visit: Payer: Self-pay | Admitting: Internal Medicine

## 2017-02-03 NOTE — Telephone Encounter (Signed)
rx ok'd for xanax #30 with one refill  

## 2017-02-03 NOTE — Telephone Encounter (Signed)
Last OV 08/25/2016 Next OV 03/02/2017 Last refill 10/11/218 with 2 refills.

## 2017-02-05 DIAGNOSIS — J301 Allergic rhinitis due to pollen: Secondary | ICD-10-CM | POA: Diagnosis not present

## 2017-02-05 DIAGNOSIS — K219 Gastro-esophageal reflux disease without esophagitis: Secondary | ICD-10-CM | POA: Diagnosis not present

## 2017-02-05 DIAGNOSIS — J3089 Other allergic rhinitis: Secondary | ICD-10-CM | POA: Diagnosis not present

## 2017-02-09 DIAGNOSIS — G43839 Menstrual migraine, intractable, without status migrainosus: Secondary | ICD-10-CM | POA: Diagnosis not present

## 2017-02-09 DIAGNOSIS — G43019 Migraine without aura, intractable, without status migrainosus: Secondary | ICD-10-CM | POA: Diagnosis not present

## 2017-02-10 DIAGNOSIS — J3089 Other allergic rhinitis: Secondary | ICD-10-CM | POA: Diagnosis not present

## 2017-02-10 DIAGNOSIS — J301 Allergic rhinitis due to pollen: Secondary | ICD-10-CM | POA: Diagnosis not present

## 2017-02-20 DIAGNOSIS — J3089 Other allergic rhinitis: Secondary | ICD-10-CM | POA: Diagnosis not present

## 2017-02-20 DIAGNOSIS — J301 Allergic rhinitis due to pollen: Secondary | ICD-10-CM | POA: Diagnosis not present

## 2017-02-26 ENCOUNTER — Other Ambulatory Visit (INDEPENDENT_AMBULATORY_CARE_PROVIDER_SITE_OTHER): Payer: 59

## 2017-02-26 DIAGNOSIS — K219 Gastro-esophageal reflux disease without esophagitis: Secondary | ICD-10-CM

## 2017-02-26 DIAGNOSIS — Z13220 Encounter for screening for lipoid disorders: Secondary | ICD-10-CM

## 2017-02-26 DIAGNOSIS — J3089 Other allergic rhinitis: Secondary | ICD-10-CM | POA: Diagnosis not present

## 2017-02-26 DIAGNOSIS — J301 Allergic rhinitis due to pollen: Secondary | ICD-10-CM | POA: Diagnosis not present

## 2017-02-26 DIAGNOSIS — E0590 Thyrotoxicosis, unspecified without thyrotoxic crisis or storm: Secondary | ICD-10-CM | POA: Diagnosis not present

## 2017-02-26 LAB — CBC WITH DIFFERENTIAL/PLATELET
Basophils Absolute: 0 10*3/uL (ref 0.0–0.1)
Basophils Relative: 0.9 % (ref 0.0–3.0)
EOS PCT: 3.1 % (ref 0.0–5.0)
Eosinophils Absolute: 0.2 10*3/uL (ref 0.0–0.7)
HEMATOCRIT: 42.5 % (ref 36.0–46.0)
HEMOGLOBIN: 14.4 g/dL (ref 12.0–15.0)
LYMPHS ABS: 1.8 10*3/uL (ref 0.7–4.0)
LYMPHS PCT: 35 % (ref 12.0–46.0)
MCHC: 33.9 g/dL (ref 30.0–36.0)
MCV: 89.7 fl (ref 78.0–100.0)
MONOS PCT: 7.1 % (ref 3.0–12.0)
Monocytes Absolute: 0.4 10*3/uL (ref 0.1–1.0)
Neutro Abs: 2.8 10*3/uL (ref 1.4–7.7)
Neutrophils Relative %: 53.9 % (ref 43.0–77.0)
Platelets: 241 10*3/uL (ref 150.0–400.0)
RBC: 4.74 Mil/uL (ref 3.87–5.11)
RDW: 13 % (ref 11.5–15.5)
WBC: 5.2 10*3/uL (ref 4.0–10.5)

## 2017-02-26 LAB — COMPREHENSIVE METABOLIC PANEL
ALK PHOS: 70 U/L (ref 39–117)
ALT: 20 U/L (ref 0–35)
AST: 20 U/L (ref 0–37)
Albumin: 4.3 g/dL (ref 3.5–5.2)
BUN: 14 mg/dL (ref 6–23)
CALCIUM: 10 mg/dL (ref 8.4–10.5)
CO2: 32 mEq/L (ref 19–32)
Chloride: 104 mEq/L (ref 96–112)
Creatinine, Ser: 0.68 mg/dL (ref 0.40–1.20)
GFR: 96.03 mL/min (ref >60.00)
GLUCOSE: 93 mg/dL (ref 70–99)
POTASSIUM: 4.3 meq/L (ref 3.5–5.1)
Sodium: 142 mEq/L (ref 135–145)
TOTAL PROTEIN: 7.1 g/dL (ref 6.0–8.3)
Total Bilirubin: 0.4 mg/dL (ref 0.2–1.2)

## 2017-02-26 LAB — LIPID PANEL
CHOLESTEROL: 187 mg/dL (ref 0–200)
HDL: 64 mg/dL (ref >39.00)
LDL CALC: 88 mg/dL (ref 0–99)
NONHDL: 123.44
Total CHOL/HDL Ratio: 3
Triglycerides: 177 mg/dL — ABNORMAL HIGH (ref 0.0–149.0)
VLDL: 35.4 mg/dL (ref 0.0–40.0)

## 2017-02-26 LAB — TSH: TSH: 2.37 u[IU]/mL (ref 0.35–4.50)

## 2017-02-27 ENCOUNTER — Encounter: Payer: Self-pay | Admitting: Internal Medicine

## 2017-03-02 ENCOUNTER — Other Ambulatory Visit (HOSPITAL_COMMUNITY)
Admission: RE | Admit: 2017-03-02 | Discharge: 2017-03-02 | Disposition: A | Discharge status: Home / Self Care | Payer: 59 | Source: Ambulatory Visit | Attending: Internal Medicine | Admitting: Internal Medicine

## 2017-03-02 ENCOUNTER — Encounter: Payer: Self-pay | Admitting: Internal Medicine

## 2017-03-02 ENCOUNTER — Ambulatory Visit (INDEPENDENT_AMBULATORY_CARE_PROVIDER_SITE_OTHER): Payer: 59 | Admitting: Internal Medicine

## 2017-03-02 VITALS — BP 130/78 | HR 60 | Temp 98.6°F | Resp 16 | Ht 63.0 in | Wt 132.1 lb

## 2017-03-02 DIAGNOSIS — G8929 Other chronic pain: Secondary | ICD-10-CM | POA: Diagnosis not present

## 2017-03-02 DIAGNOSIS — Z124 Encounter for screening for malignant neoplasm of cervix: Secondary | ICD-10-CM | POA: Insufficient documentation

## 2017-03-02 DIAGNOSIS — Z96642 Presence of left artificial hip joint: Secondary | ICD-10-CM | POA: Diagnosis not present

## 2017-03-02 DIAGNOSIS — F419 Anxiety disorder, unspecified: Secondary | ICD-10-CM | POA: Diagnosis not present

## 2017-03-02 DIAGNOSIS — K219 Gastro-esophageal reflux disease without esophagitis: Secondary | ICD-10-CM

## 2017-03-02 DIAGNOSIS — M25552 Pain in left hip: Secondary | ICD-10-CM | POA: Diagnosis not present

## 2017-03-02 DIAGNOSIS — Z0000 Encounter for general adult medical examination without abnormal findings: Secondary | ICD-10-CM | POA: Diagnosis not present

## 2017-03-02 DIAGNOSIS — E0590 Thyrotoxicosis, unspecified without thyrotoxic crisis or storm: Secondary | ICD-10-CM | POA: Diagnosis not present

## 2017-03-02 DIAGNOSIS — D649 Anemia, unspecified: Secondary | ICD-10-CM | POA: Diagnosis not present

## 2017-03-02 MED ORDER — ALPRAZOLAM 0.25 MG PO TABS: ORAL_TABLET | ORAL | 1 refills | Status: DC

## 2017-03-02 NOTE — Progress Notes (Addendum)
Patient ID: Shannon [name], female   DOB: 11-14-1965, 52 y.o.   MRN: [id number]   Subjective:    Patient ID: Shannon [name], female    DOB: [birth year], 52 y.o.   MRN: [id number]  HPI  Patient here for her physical exam.  She reports she is doing well.  Handling stress.  Stays active.  Exercises.   No chest pain.  No sob.  No acid reflux.  No abdominal pain.  Bowels moving.  Saw GI.  Recommended to remain on omeprazole. F/u colonoscopy due 2026.  Hip doing ok.  S/p total left hip arthroplasty 2017.     Past Medical History:  Diagnosis Date  . Anemia   . Anxiety   . Breast pain, right 11/07/2012  . Breathing difficulty 06/20/2014  . Environmental allergies   . GERD (gastroesophageal reflux disease)   . Hiatal hernia    small  . History of migraine headaches   . Hyperthyroidism    s/p ablation  . Vaginitis 01/16/2013   Past Surgical History:  Procedure Laterality Date  . BREAST BIOPSY Right 12/05/2015   neg  . CESAREAN SECTION  11/98   Dr Roena Malady  . CHOLECYSTECTOMY  11/05   Dr Pat Patrick  . JOINT REPLACEMENT Left 2017   hip  . NASAL SINUS SURGERY  11/93  . SEPTOPLASTY  5/90   Dr Rossie Muskrat  . TUBAL LIGATION  02/1998  . UMBILICAL HERNIA REPAIR  02/1998   Family History  Problem Relation Age of Onset  . Heart disease Father   . Hypertension Father   . Thyroid disease Father   . Hypercholesterolemia Father   . Kidney cancer Father   . Leukemia Father        hairy cell  . Hypercholesterolemia Mother   . Thyroid disease Mother   . Breast cancer Mother 59  . Breast cancer Unknown        maternal great grandmother  . Heart disease Maternal Grandfather        MI - age 62  . Prostate cancer Paternal Grandfather   . Pancreatic cancer Paternal Grandfather   . Lung cancer Paternal Grandfather   . Bladder Cancer Neg Hx    Social History   Socioeconomic History  . Marital status: Married    Spouse name: None  . Number of children: 1  . Years of education: None  . Highest  education level: None  Social Needs  . Financial resource strain: None  . Food insecurity - worry: None  . Food insecurity - inability: None  . Transportation needs - medical: None  . Transportation needs - non-medical: None  Occupational History  . None  Tobacco Use  . Smoking status: Former Smoker    Last attempt to quit: 01/28/2000    Years since quitting: 17.1  . Smokeless tobacco: Never Used  Substance and Sexual Activity  . Alcohol use: No    Alcohol/week: 0.0 oz  . Drug use: No  . Sexual activity: None  Other Topics Concern  . None  Social History Narrative  . None    Outpatient Encounter Medications as of 03/02/2017  Medication Sig  . acetaminophen (TYLENOL) 500 MG tablet Take 1,000 mg by mouth every 6 (six) hours as needed for moderate pain.  Marland Kitchen ALPRAZolam (XANAX) 0.25 MG tablet TAKE ONE TABLET BY MOUTH AT BEDTIME AS NEEDED FOR ANXIETY.  Marland Kitchen omeprazole (PRILOSEC) 40 MG capsule Take 40 mg by mouth daily.  Marland Kitchen omeprazole (PRILOSEC) 40 MG capsule  Take by mouth.  . ranitidine (ZANTAC) 150 MG tablet Take 150 mg by mouth as needed.   . [DISCONTINUED] ALPRAZolam (XANAX) 0.25 MG tablet TAKE ONE TABLET BY MOUTH AT BEDTIME AS NEEDED FOR ANXIETY.   No facility-administered encounter medications on file as of 03/02/2017.     Review of Systems  Constitutional: Negative for appetite change and unexpected weight change.  HENT: Negative for congestion and sinus pressure.   Eyes: Negative for pain and visual disturbance.  Respiratory: Negative for cough, chest tightness and shortness of breath.   Cardiovascular: Negative for chest pain, palpitations and leg swelling.  Gastrointestinal: Negative for abdominal pain, diarrhea, nausea and vomiting.  Genitourinary: Negative for difficulty urinating and dysuria.  Musculoskeletal: Negative for joint swelling and myalgias.  Skin: Negative for color change and rash.  Neurological: Negative for dizziness, light-headedness and headaches.    Hematological: Negative for adenopathy. Does not bruise/bleed easily.  Psychiatric/Behavioral: Negative for agitation and dysphoric mood.       Objective:     Blood pressure rechecked by me:  118/64  Physical Exam  Constitutional: She is oriented to person, place, and time. She appears well-developed and well-nourished. No distress.  HENT:  Nose: Nose normal.  Mouth/Throat: Oropharynx is clear and moist.  Eyes: Right eye exhibits no discharge. Left eye exhibits no discharge. No scleral icterus.  Neck: Neck supple. No thyromegaly present.  Cardiovascular: Normal rate and regular rhythm.  Pulmonary/Chest: Breath sounds normal. No accessory muscle usage. No tachypnea. No respiratory distress. She has no decreased breath sounds. She has no wheezes. She has no rhonchi. Right breast exhibits no inverted nipple, no mass, no nipple discharge and no tenderness (no axillary adenopathy). Left breast exhibits no inverted nipple, no mass, no nipple discharge and no tenderness (no axilarry adenopathy).  Abdominal: Soft. Bowel sounds are normal. There is no tenderness.  Musculoskeletal: She exhibits no edema or tenderness.  Lymphadenopathy:    She has no cervical adenopathy.  Neurological: She is alert and oriented to person, place, and time.  Skin: Skin is warm. No rash noted. No erythema.  Psychiatric: She has a normal mood and affect. Her behavior is normal.    BP 130/78 (BP Location: Left Arm, Patient Position: Sitting, Cuff Size: Normal)   Pulse 60   Temp 98.6 F (37 C) (Oral)   Resp 16   Ht 5\' 3"  (1.6 m)   Wt 132 lb 2 oz (59.9 kg)   LMP 02/12/2013   SpO2 98%   BMI 23.40 kg/m  Wt Readings from Last 3 Encounters:  03/02/17 132 lb 2 oz (59.9 kg)  08/25/16 130 lb 3.2 oz (59.1 kg)  07/21/16 132 lb (59.9 kg)     Lab Results  Component Value Date   WBC 5.2 02/26/2017   HGB 14.4 02/26/2017   HCT 42.5 02/26/2017   PLT 241.0 02/26/2017   GLUCOSE 93 02/26/2017   CHOL 187 02/26/2017    TRIG 177.0 (H) 02/26/2017   HDL 64.00 02/26/2017   LDLCALC 88 02/26/2017   ALT 20 02/26/2017   AST 20 02/26/2017   NA 142 02/26/2017   K 4.3 02/26/2017   CL 104 02/26/2017   CREATININE 0.68 02/26/2017   BUN 14 02/26/2017   CO2 32 02/26/2017   TSH 2.37 02/26/2017    Mm Screening Breast Tomo Bilateral  Result Date: 12/02/2016 CLINICAL DATA:  Screening. EXAM: 2D DIGITAL SCREENING BILATERAL MAMMOGRAM WITH CAD AND ADJUNCT TOMO COMPARISON:  Previous exam(s). ACR Breast Density Category c: The breast  tissue is heterogeneously dense, which may obscure small masses. FINDINGS: There are no findings suspicious for malignancy. Images were processed with CAD. IMPRESSION: No mammographic evidence of malignancy. A result letter of this screening mammogram will be mailed directly to the patient. RECOMMENDATION: Screening mammogram in one year. (Code:SM-B-01Y) BI-RADS CATEGORY  1: Negative. Electronically Signed   By: Abelardo Diesel M.D.   On: 12/02/2016 12:28       Assessment & Plan:   Problem List Items Addressed This Visit    Anemia    hgb checked 02/26/17 - wnl.       Anxiety    Takes xanax.  Overall stable.  Follow.       Relevant Medications   ALPRAZolam (XANAX) 0.25 MG tablet   Chronic hip pain (Location of Primary Source of Pain) (intermittent) (Left) (Chronic)    S/p total left hip arthroplasty.  Doing well.        GERD (gastroesophageal reflux disease)    Followed by GI.  Just evaluated.  Continue omeprazole 40mg  q day.  Symptoms controlled.       Relevant Medications   omeprazole (PRILOSEC) 40 MG capsule   Health care maintenance    Physical today 03/12/17.  PAP 03/02/17.  Colonoscopy 11/2014.  Recommended f/u in 11/2024.  Mammogram 12/02/16 - Birads I.       History of left hip replacement    Doing well.  No pain.       Hyperthyroidism    S/p ablation.  Follow tsh.        Other Visit Diagnoses    Screening for cervical cancer    -  Primary   Relevant Orders    Cytology - PAP (Completed)   Routine general medical examination at a health care facility       Relevant Orders   Hepatitis C antibody       [name], MD

## 2017-03-02 NOTE — Assessment & Plan Note (Signed)
Physical today 03/12/17.  PAP 03/02/17.  Colonoscopy 11/2014.  Recommended f/u in 11/2024.  Mammogram 12/02/16 - Birads I.

## 2017-03-03 LAB — CYTOLOGY - PAP
Diagnosis: NEGATIVE
HPV (WINDOPATH): NOT DETECTED

## 2017-03-04 ENCOUNTER — Encounter: Payer: Self-pay | Admitting: Internal Medicine

## 2017-03-05 ENCOUNTER — Encounter: Payer: Self-pay | Admitting: Internal Medicine

## 2017-03-05 DIAGNOSIS — J3089 Other allergic rhinitis: Secondary | ICD-10-CM | POA: Diagnosis not present

## 2017-03-05 DIAGNOSIS — J301 Allergic rhinitis due to pollen: Secondary | ICD-10-CM | POA: Diagnosis not present

## 2017-03-05 NOTE — Assessment & Plan Note (Signed)
Takes xanax.  Overall stable.  Follow.

## 2017-03-05 NOTE — Assessment & Plan Note (Signed)
S/p total left hip arthroplasty.  Doing well.

## 2017-03-05 NOTE — Assessment & Plan Note (Signed)
hgb checked 02/26/17 - wnl.

## 2017-03-05 NOTE — Assessment & Plan Note (Signed)
S/p ablation.  Follow tsh.

## 2017-03-05 NOTE — Assessment & Plan Note (Signed)
Followed by GI.  Just evaluated.  Continue omeprazole 40mg  q day.  Symptoms controlled.

## 2017-03-05 NOTE — Assessment & Plan Note (Signed)
Doing well. No pain 

## 2017-03-06 ENCOUNTER — Other Ambulatory Visit: Payer: Self-pay | Admitting: Internal Medicine

## 2017-03-10 DIAGNOSIS — J3089 Other allergic rhinitis: Secondary | ICD-10-CM | POA: Diagnosis not present

## 2017-03-10 DIAGNOSIS — J301 Allergic rhinitis due to pollen: Secondary | ICD-10-CM | POA: Diagnosis not present

## 2017-03-19 DIAGNOSIS — J3089 Other allergic rhinitis: Secondary | ICD-10-CM | POA: Diagnosis not present

## 2017-03-19 DIAGNOSIS — J301 Allergic rhinitis due to pollen: Secondary | ICD-10-CM | POA: Diagnosis not present

## 2017-03-19 DIAGNOSIS — M79671 Pain in right foot: Secondary | ICD-10-CM | POA: Diagnosis not present

## 2017-03-19 DIAGNOSIS — M2021 Hallux rigidus, right foot: Secondary | ICD-10-CM | POA: Diagnosis not present

## 2017-03-26 DIAGNOSIS — J3089 Other allergic rhinitis: Secondary | ICD-10-CM | POA: Diagnosis not present

## 2017-03-26 DIAGNOSIS — J301 Allergic rhinitis due to pollen: Secondary | ICD-10-CM | POA: Diagnosis not present

## 2017-03-26 DIAGNOSIS — R05 Cough: Secondary | ICD-10-CM | POA: Diagnosis not present

## 2017-03-26 DIAGNOSIS — H1045 Other chronic allergic conjunctivitis: Secondary | ICD-10-CM | POA: Diagnosis not present

## 2017-04-02 DIAGNOSIS — J3089 Other allergic rhinitis: Secondary | ICD-10-CM | POA: Diagnosis not present

## 2017-04-02 DIAGNOSIS — J301 Allergic rhinitis due to pollen: Secondary | ICD-10-CM | POA: Diagnosis not present

## 2017-04-07 DIAGNOSIS — R05 Cough: Secondary | ICD-10-CM | POA: Diagnosis not present

## 2017-04-07 DIAGNOSIS — J301 Allergic rhinitis due to pollen: Secondary | ICD-10-CM | POA: Diagnosis not present

## 2017-04-07 DIAGNOSIS — J019 Acute sinusitis, unspecified: Secondary | ICD-10-CM | POA: Diagnosis not present

## 2017-04-07 DIAGNOSIS — J3089 Other allergic rhinitis: Secondary | ICD-10-CM | POA: Diagnosis not present

## 2017-04-16 DIAGNOSIS — J301 Allergic rhinitis due to pollen: Secondary | ICD-10-CM | POA: Diagnosis not present

## 2017-04-16 DIAGNOSIS — J3089 Other allergic rhinitis: Secondary | ICD-10-CM | POA: Diagnosis not present

## 2017-04-23 DIAGNOSIS — J3089 Other allergic rhinitis: Secondary | ICD-10-CM | POA: Diagnosis not present

## 2017-04-23 DIAGNOSIS — J301 Allergic rhinitis due to pollen: Secondary | ICD-10-CM | POA: Diagnosis not present

## 2017-05-01 DIAGNOSIS — J301 Allergic rhinitis due to pollen: Secondary | ICD-10-CM | POA: Diagnosis not present

## 2017-05-01 DIAGNOSIS — J3089 Other allergic rhinitis: Secondary | ICD-10-CM | POA: Diagnosis not present

## 2017-05-07 DIAGNOSIS — J301 Allergic rhinitis due to pollen: Secondary | ICD-10-CM | POA: Diagnosis not present

## 2017-05-07 DIAGNOSIS — J3089 Other allergic rhinitis: Secondary | ICD-10-CM | POA: Diagnosis not present

## 2017-05-08 DIAGNOSIS — J301 Allergic rhinitis due to pollen: Secondary | ICD-10-CM | POA: Diagnosis not present

## 2017-05-11 DIAGNOSIS — J3089 Other allergic rhinitis: Secondary | ICD-10-CM | POA: Diagnosis not present

## 2017-05-12 DIAGNOSIS — J301 Allergic rhinitis due to pollen: Secondary | ICD-10-CM | POA: Diagnosis not present

## 2017-05-12 DIAGNOSIS — J3089 Other allergic rhinitis: Secondary | ICD-10-CM | POA: Diagnosis not present

## 2017-05-21 DIAGNOSIS — J3089 Other allergic rhinitis: Secondary | ICD-10-CM | POA: Diagnosis not present

## 2017-05-21 DIAGNOSIS — J301 Allergic rhinitis due to pollen: Secondary | ICD-10-CM | POA: Diagnosis not present

## 2017-05-28 DIAGNOSIS — J3089 Other allergic rhinitis: Secondary | ICD-10-CM | POA: Diagnosis not present

## 2017-05-28 DIAGNOSIS — J301 Allergic rhinitis due to pollen: Secondary | ICD-10-CM | POA: Diagnosis not present

## 2017-06-04 DIAGNOSIS — J3089 Other allergic rhinitis: Secondary | ICD-10-CM | POA: Diagnosis not present

## 2017-06-04 DIAGNOSIS — J301 Allergic rhinitis due to pollen: Secondary | ICD-10-CM | POA: Diagnosis not present

## 2017-06-11 ENCOUNTER — Other Ambulatory Visit: Payer: Self-pay | Admitting: Internal Medicine

## 2017-06-11 DIAGNOSIS — J301 Allergic rhinitis due to pollen: Secondary | ICD-10-CM | POA: Diagnosis not present

## 2017-06-11 DIAGNOSIS — J3089 Other allergic rhinitis: Secondary | ICD-10-CM | POA: Diagnosis not present

## 2017-06-11 NOTE — Telephone Encounter (Signed)
rx ok'd for xanax #30 with one refill.  Signed and placed in box.

## 2017-06-11 NOTE — Telephone Encounter (Signed)
Last OV 03/02/17 Next OV 08/31/17 Last refill 03/02/17

## 2017-06-12 ENCOUNTER — Other Ambulatory Visit: Payer: Self-pay | Admitting: Internal Medicine

## 2017-06-13 ENCOUNTER — Encounter: Payer: Self-pay | Admitting: Internal Medicine

## 2017-06-15 ENCOUNTER — Other Ambulatory Visit: Payer: Self-pay

## 2017-06-15 ENCOUNTER — Encounter: Payer: Self-pay | Admitting: Pain Medicine

## 2017-06-15 ENCOUNTER — Ambulatory Visit: Payer: 59 | Attending: Pain Medicine | Admitting: Pain Medicine

## 2017-06-15 VITALS — BP 138/90 | HR 81 | Temp 98.4°F | Resp 18 | Ht 62.0 in | Wt 127.0 lb

## 2017-06-15 DIAGNOSIS — M5442 Lumbago with sciatica, left side: Secondary | ICD-10-CM | POA: Diagnosis not present

## 2017-06-15 DIAGNOSIS — Z882 Allergy status to sulfonamides status: Secondary | ICD-10-CM | POA: Diagnosis not present

## 2017-06-15 DIAGNOSIS — M7918 Myalgia, other site: Secondary | ICD-10-CM | POA: Diagnosis not present

## 2017-06-15 DIAGNOSIS — M47818 Spondylosis without myelopathy or radiculopathy, sacral and sacrococcygeal region: Secondary | ICD-10-CM

## 2017-06-15 DIAGNOSIS — M1612 Unilateral primary osteoarthritis, left hip: Secondary | ICD-10-CM | POA: Insufficient documentation

## 2017-06-15 DIAGNOSIS — M47816 Spondylosis without myelopathy or radiculopathy, lumbar region: Secondary | ICD-10-CM

## 2017-06-15 DIAGNOSIS — Z9851 Tubal ligation status: Secondary | ICD-10-CM | POA: Insufficient documentation

## 2017-06-15 DIAGNOSIS — Z79899 Other long term (current) drug therapy: Secondary | ICD-10-CM | POA: Insufficient documentation

## 2017-06-15 DIAGNOSIS — Z885 Allergy status to narcotic agent status: Secondary | ICD-10-CM | POA: Insufficient documentation

## 2017-06-15 DIAGNOSIS — M5416 Radiculopathy, lumbar region: Secondary | ICD-10-CM

## 2017-06-15 DIAGNOSIS — M545 Low back pain: Secondary | ICD-10-CM | POA: Diagnosis not present

## 2017-06-15 DIAGNOSIS — M25552 Pain in left hip: Secondary | ICD-10-CM

## 2017-06-15 DIAGNOSIS — M5388 Other specified dorsopathies, sacral and sacrococcygeal region: Secondary | ICD-10-CM | POA: Insufficient documentation

## 2017-06-15 DIAGNOSIS — K449 Diaphragmatic hernia without obstruction or gangrene: Secondary | ICD-10-CM | POA: Diagnosis not present

## 2017-06-15 DIAGNOSIS — M461 Sacroiliitis, not elsewhere classified: Secondary | ICD-10-CM

## 2017-06-15 DIAGNOSIS — Z9049 Acquired absence of other specified parts of digestive tract: Secondary | ICD-10-CM | POA: Diagnosis not present

## 2017-06-15 DIAGNOSIS — Z9889 Other specified postprocedural states: Secondary | ICD-10-CM | POA: Diagnosis not present

## 2017-06-15 DIAGNOSIS — Z96642 Presence of left artificial hip joint: Secondary | ICD-10-CM

## 2017-06-15 DIAGNOSIS — G8929 Other chronic pain: Secondary | ICD-10-CM

## 2017-06-15 DIAGNOSIS — M79604 Pain in right leg: Secondary | ICD-10-CM | POA: Insufficient documentation

## 2017-06-15 DIAGNOSIS — G894 Chronic pain syndrome: Secondary | ICD-10-CM | POA: Insufficient documentation

## 2017-06-15 DIAGNOSIS — Z87891 Personal history of nicotine dependence: Secondary | ICD-10-CM | POA: Diagnosis not present

## 2017-06-15 DIAGNOSIS — M48061 Spinal stenosis, lumbar region without neurogenic claudication: Secondary | ICD-10-CM | POA: Insufficient documentation

## 2017-06-15 DIAGNOSIS — Z881 Allergy status to other antibiotic agents status: Secondary | ICD-10-CM | POA: Diagnosis not present

## 2017-06-15 DIAGNOSIS — K219 Gastro-esophageal reflux disease without esophagitis: Secondary | ICD-10-CM | POA: Insufficient documentation

## 2017-06-15 DIAGNOSIS — F419 Anxiety disorder, unspecified: Secondary | ICD-10-CM | POA: Diagnosis not present

## 2017-06-15 DIAGNOSIS — D649 Anemia, unspecified: Secondary | ICD-10-CM | POA: Insufficient documentation

## 2017-06-15 DIAGNOSIS — M533 Sacrococcygeal disorders, not elsewhere classified: Secondary | ICD-10-CM | POA: Diagnosis not present

## 2017-06-15 NOTE — Progress Notes (Signed)
Safety precautions to be maintained throughout the outpatient stay will include: orient to surroundings, keep bed in low position, maintain call bell within reach at all times, provide assistance with transfer out of bed and ambulation.  

## 2017-06-15 NOTE — Patient Instructions (Addendum)
____________________________________________________________________________________________  Preparing for your procedure (without sedation)  Instructions: . Oral Intake: Do not eat or drink anything for at least 3 hours prior to your procedure. . Transportation: Unless otherwise stated by your physician, you may drive yourself after the procedure. . Blood Pressure Medicine: Take your blood pressure medicine with a sip of water the morning of the procedure. . Blood thinners:  . Diabetics on insulin: Notify the staff so that you can be scheduled 1st case in the morning. If your diabetes requires high dose insulin, take only  of your normal insulin dose the morning of the procedure and notify the staff that you have done so. . Preventing infections: Shower with an antibacterial soap the morning of your procedure.  . Build-up your immune system: Take 1000 mg of Vitamin C with every meal (3 times a day) the day prior to your procedure. Marland Kitchen Antibiotics: Inform the staff if you have a condition or reason that requires you to take antibiotics before dental procedures. . Pregnancy: If you are pregnant, call and cancel the procedure. . Sickness: If you have a cold, fever, or any active infections, call and cancel the procedure. . Arrival: You must be in the facility at least 30 minutes prior to your scheduled procedure. . Children: Do not bring any children with you. . Dress appropriately: Bring dark clothing that you would not mind if they get stained. . Valuables: Do not bring any jewelry or valuables.  Procedure appointments are reserved for interventional treatments only. Marland Kitchen No Prescription Refills. . No medication changes will be discussed during procedure appointments. . No disability issues will be discussed.  Remember:  Regular Business hours are:  Monday to Thursday 8:00 AM to 4:00 PM  Provider's Schedule: Milinda Pointer, MD:  Procedure days: Tuesday and Thursday 7:30 AM to 4:00  PM  Gillis Santa, MD:  Procedure days: Monday and Wednesday 7:30 AM to 4:00 PM ____________________________________________________________________________________________   Preparing for your procedure (without sedation) Instructions: . Oral Intake: Do not eat or drink anything for at least 3 hours prior to your procedure. . Transportation: Unless otherwise stated by your physician, you may drive yourself after the procedure. . Blood Pressure Medicine: Take your blood pressure medicine with a sip of water the morning of the procedure. . Insulin: Take only  of your normal insulin dose. . Preventing infections: Shower with an antibacterial soap the morning of your procedure. . Build-up your immune system: Take 1000 mg of Vitamin C with every meal (3 times a day) the day prior to your procedure. . Pregnancy: If you are pregnant, call and cancel the procedure. . Sickness: If you have a cold, fever, or any active infections, call and cancel the procedure. . Arrival: You must be in the facility at least 30 minutes prior to your scheduled procedure. . Children: Do not bring any children with you. . Dress appropriately: Bring dark clothing that you would not mind if they get stained. . Valuables: Do not bring any jewelry or valuables. Procedure appointments are reserved for interventional treatments only. Marland Kitchen No Prescription Refills. . No medication changes will be discussed during procedure appointments. . No disability issues will be discussed.

## 2017-06-15 NOTE — Telephone Encounter (Signed)
Script faxed patient notified. 

## 2017-06-15 NOTE — Progress Notes (Signed)
Patient's Name: Shannon [name]  MRN: [id number]  Referring Provider: [name], MD  DOB: Feb 03, 1965  PCP: [name], MD  DOS: 06/15/2017  Note by: Gaspar Cola, MD  Service setting: Ambulatory outpatient  Specialty: Interventional Pain Management  Location: [facility] (AMB) Pain Management Facility    Patient type: Established   Primary Reason(s) for Visit: Evaluation of chronic illnesses with exacerbation, or progression (Level of risk: moderate) CC: Back Pain (low) and Leg Pain (right)  HPI  Shannon [name] is a 52 y.o. year old, female patient, who comes today for a follow-up evaluation. She has Hyperthyroidism; GERD (gastroesophageal reflux disease); Anemia; Migraine; Menopausal symptoms; Health care maintenance; Anxiety; Gastro-esophageal reflux disease without esophagitis; Chronic low back pain (Bilateral) (L>R) w/ sciatica (Left); Lumbar lateral recess stenosis (L4-5) (Left); Lumbar facet hypertrophy (L4-5) (Left); Chronic lumbar radicular pain [facility] source of pain) (intermittent) (L4 Dermatome) (Left); Chronic hip pain (Primary Source of Pain) (intermittent) (Left); Chronic sacroiliac joint pain (Secondary source of pain) (intermittent) (Left); Nicotine dependence; Tobacco abuse; Lumbar spondylosis; Encounter for chronic pain management; Post-menopausal bleeding; Axillary lump; Osteoarthritis of sacroiliac joint (Bilateral) (L>R); History of left hip replacement; Primary osteoarthritis of left hip; Chronic sacroiliac joint pain (Right); Chronic myofascial pain syndrome (Right Gluteous Muscle); Chronic pain syndrome; and Other specified dorsopathies, sacral and sacrococcygeal region on their problem list. Shannon [name] was last seen on Visit date not found. Her primarily concern today is the Back Pain (low) and Leg Pain (right)  Pain Assessment: Location: Lower Back Radiating: right leg Onset: 1 to 4 weeks ago Duration: Chronic pain Quality: Aching Severity: 1 /10 (subjective,  self-reported pain score)  Note: Reported level is compatible with observation.                         When using our objective Pain Scale, levels between 6 and 10/10 are said to belong in an emergency room, as it progressively worsens from a 6/10, described as severely limiting, requiring emergency care not usually available at an outpatient pain management facility. At a 6/10 level, communication becomes difficult and requires great effort. Assistance to reach the emergency department may be required. Facial flushing and profuse sweating along with potentially dangerous increases in heart rate and blood pressure will be evident. Timing: Intermittent Modifying factors: heat, ice, sitting BP: 138/90  HR: 81  The patient returns to the clinic today after last time being seen almost 1 year ago. She had a hip replacement and its when well with regards to the hip. However, she is still having problems with the SI joint. Physical exam today has confirm that the pain appears to be coming from that area. We will go ahead and have her return for a palliative intra-articular SI joint injection under fluoroscopic guidance. She usually does extremely well with those.  Further details on both, my assessment(s), as well as the proposed treatment plan, please see below.  Laboratory Chemistry  Inflammation Markers (CRP: Acute Phase) (ESR: Chronic Phase) Lab Results  Component Value Date   ESRSEDRATE 8 05/22/2015                         Renal Function Markers Lab Results  Component Value Date   BUN 14 02/26/2017   CREATININE 0.68 02/26/2017   GFRAA >60 05/02/2014   GFRNONAA >60 05/02/2014  Hepatic Function Markers Lab Results  Component Value Date   AST 20 02/26/2017   ALT 20 02/26/2017   ALBUMIN 4.3 02/26/2017   ALKPHOS 70 02/26/2017                        Electrolytes Lab Results  Component Value Date   NA 142 02/26/2017   K 4.3 02/26/2017   CL 104 02/26/2017    CALCIUM 10.0 02/26/2017                        Coagulation Parameters Lab Results  Component Value Date   PLT 241.0 02/26/2017                        Cardiovascular Markers Lab Results  Component Value Date   CKTOTAL 65 05/22/2015   TROPONINI <0.03 05/02/2014   HGB 14.4 02/26/2017   HCT 42.5 02/26/2017                         Note: Lab results reviewed.  Recent Diagnostic Imaging Review   Complexity Note: No results found under the [facility] electronic medical record.                         Meds   Current Outpatient Medications:  .  acetaminophen (TYLENOL) 500 MG tablet, Take 1,000 mg by mouth every 6 (six) hours as needed for moderate pain., Disp: , Rfl:  .  ALPRAZolam (XANAX) 0.25 MG tablet, TAKE ONE TABLET AT BEDTIME AS NEEDED FORANXIETY, Disp: 30 tablet, Rfl: 1 .  omeprazole (PRILOSEC) 40 MG capsule, Take 40 mg by mouth daily., Disp: , Rfl:  .  ranitidine (ZANTAC) 150 MG tablet, Take 150 mg by mouth as needed. , Disp: , Rfl:   ROS  Constitutional: Denies any fever or chills Gastrointestinal: No reported hemesis, hematochezia, vomiting, or acute GI distress Musculoskeletal: Denies any acute onset joint swelling, redness, loss of ROM, or weakness Neurological: No reported episodes of acute onset apraxia, aphasia, dysarthria, agnosia, amnesia, paralysis, loss of coordination, or loss of consciousness  Allergies  Shannon [name] is allergic to codeine; decongest-aid [pseudoephedrine]; flagyl [metronidazole]; and sulfa antibiotics.  [place]  Drug: Shannon [name]  reports that she does not use drugs. Alcohol:  reports that she does not drink alcohol. Tobacco:  reports that she quit smoking about 17 years ago. She has never used smokeless tobacco. Medical:  has a past medical history of Anemia, Anxiety, Breast pain, right (11/07/2012), Breathing difficulty (06/20/2014), Environmental allergies, GERD (gastroesophageal reflux disease), Hiatal hernia, History of migraine headaches,  Hyperthyroidism, and Vaginitis (01/16/2013). Surgical: Shannon [name]  has a past surgical history that includes Septoplasty (5/90); Nasal sinus surgery (11/93); Cesarean section (11/98); Tubal ligation (02/1998); Umbilical hernia repair (02/1998); Cholecystectomy (11/05); Joint replacement (Left, 2017); and Breast biopsy (Right, 12/05/2015). Family: family history includes Breast cancer in her unknown relative; Breast cancer (age of onset: 71) in her mother; Heart disease in her father and maternal grandfather; Hypercholesterolemia in her father and mother; Hypertension in her father; Kidney cancer in her father; Leukemia in her father; Lung cancer in her paternal grandfather; Pancreatic cancer in her paternal grandfather; Prostate cancer in her paternal grandfather; Thyroid disease in her father and mother.  Constitutional Exam  General appearance: Well nourished, well developed, and well hydrated. In no apparent acute distress Vitals:   06/15/17 0850  BP: 138/90  Pulse: 81  Resp: 18  Temp: 98.4 F (36.9 C)  TempSrc: Oral  SpO2: 100%  Weight: 127 lb (57.6 kg)  Height: '5\' 2"'  (1.575 m)   BMI Assessment: Estimated body mass index is 23.23 kg/m as calculated from the following:   Height as of this encounter: '5\' 2"'  (1.575 m).   Weight as of this encounter: 127 lb (57.6 kg).  BMI interpretation table: BMI level Category Range association with higher incidence of chronic pain  <18 kg/m2 Underweight   18.5-24.9 kg/m2 Ideal body weight   25-29.9 kg/m2 Overweight Increased incidence by 20%  30-34.9 kg/m2 Obese (Class I) Increased incidence by 68%  35-39.9 kg/m2 Severe obesity (Class II) Increased incidence by 136%  >40 kg/m2 Extreme obesity (Class III) Increased incidence by 254%   Patient's current BMI Ideal Body weight  Body mass index is 23.23 kg/m. Ideal body weight: 50.1 kg (110 lb 7.2 oz) Adjusted ideal body weight: 53.1 kg (117 lb 1.1 oz)   BMI Readings from Last 4 Encounters:  06/15/17  23.23 kg/m  03/02/17 23.40 kg/m  08/25/16 23.81 kg/m  07/21/16 24.14 kg/m   Wt Readings from Last 4 Encounters:  06/15/17 127 lb (57.6 kg)  03/02/17 132 lb 2 oz (59.9 kg)  08/25/16 130 lb 3.2 oz (59.1 kg)  07/21/16 132 lb (59.9 kg)  Psych/Mental status: Alert, oriented x 3 (person, place, & time)       Eyes: PERLA Respiratory: No evidence of acute respiratory distress  Cervical Spine Area Exam  Skin & Axial Inspection: No masses, redness, edema, swelling, or associated skin lesions Alignment: Symmetrical Functional ROM: Unrestricted ROM      Stability: No instability detected Muscle Tone/Strength: Functionally intact. No obvious neuro-muscular anomalies detected. Sensory (Neurological): Unimpaired Palpation: No palpable anomalies              Upper Extremity (UE) Exam    Side: Right upper extremity  Side: Left upper extremity  Skin & Extremity Inspection: Skin color, temperature, and hair growth are WNL. No peripheral edema or cyanosis. No masses, redness, swelling, asymmetry, or associated skin lesions. No contractures.  Skin & Extremity Inspection: Skin color, temperature, and hair growth are WNL. No peripheral edema or cyanosis. No masses, redness, swelling, asymmetry, or associated skin lesions. No contractures.  Functional ROM: Unrestricted ROM          Functional ROM: Unrestricted ROM          Muscle Tone/Strength: Functionally intact. No obvious neuro-muscular anomalies detected.  Muscle Tone/Strength: Functionally intact. No obvious neuro-muscular anomalies detected.  Sensory (Neurological): Unimpaired          Sensory (Neurological): Unimpaired          Palpation: No palpable anomalies              Palpation: No palpable anomalies              Specialized Test(s): Deferred         Specialized Test(s): Deferred          Thoracic Spine Area Exam  Skin & Axial Inspection: No masses, redness, or swelling Alignment: Symmetrical Functional ROM: Unrestricted ROM Stability:  No instability detected Muscle Tone/Strength: Functionally intact. No obvious neuro-muscular anomalies detected. Sensory (Neurological): Unimpaired Muscle strength & Tone: No palpable anomalies  Lumbar Spine Area Exam  Skin & Axial Inspection: No masses, redness, or swelling Alignment: Symmetrical Functional ROM: Unrestricted ROM       Stability: No instability detected Muscle Tone/Strength:  Functionally intact. No obvious neuro-muscular anomalies detected. Sensory (Neurological): Unimpaired Palpation: No palpable anomalies       Provocative Tests: Lumbar Hyperextension and rotation test: Negative       Lumbar Lateral bending test: evaluation deferred today       Patrick's Maneuver: Positive for bilateral S-I arthralgia (R>L)              Gait & Posture Assessment  Ambulation: Unassisted Gait: Relatively normal for age and body habitus Posture: WNL   Lower Extremity Exam    Side: Right lower extremity  Side: Left lower extremity  Stability: No instability observed          Stability: No instability observed          Skin & Extremity Inspection: Skin color, temperature, and hair growth are WNL. No peripheral edema or cyanosis. No masses, redness, swelling, asymmetry, or associated skin lesions. No contractures.  Skin & Extremity Inspection: Skin color, temperature, and hair growth are WNL. No peripheral edema or cyanosis. No masses, redness, swelling, asymmetry, or associated skin lesions. No contractures.  Functional ROM: Unrestricted ROM                  Functional ROM: Unrestricted ROM                  Muscle Tone/Strength: Functionally intact. No obvious neuro-muscular anomalies detected.  Muscle Tone/Strength: Functionally intact. No obvious neuro-muscular anomalies detected.  Sensory (Neurological): Unimpaired  Sensory (Neurological): Unimpaired  Palpation: No palpable anomalies  Palpation: No palpable anomalies   Assessment  Primary Diagnosis & Pertinent Problem List: The  primary encounter diagnosis was Chronic sacroiliac joint pain (Right). Diagnoses of Osteoarthritis of sacroiliac joint (Bilateral) (L>R), Other specified dorsopathies, sacral and sacrococcygeal region, Chronic hip pain (Primary Source of Pain) (intermittent) (Left), History of left hip replacement, Chronic sacroiliac joint pain (Secondary source of pain) (intermittent) (Left), Chronic lumbar radicular pain (Tertiary source of pain) (intermittent) (L4 Dermatome) (Left), Chronic low back pain (Bilateral) (L>R) w/ sciatica (Left), Lumbar lateral recess stenosis (L4-5) (Left), Lumbar facet hypertrophy (L4-5) (Left), Osteoarthritis of hip (bone-on-bone) (Left), Chronic myofascial pain syndrome (Right Gluteous Muscle), and Chronic pain syndrome were also pertinent to this visit.  Status Diagnosis  Recurring Stable Stable 1. Chronic sacroiliac joint pain (Right)   2. Osteoarthritis of sacroiliac joint (Bilateral) (L>R)   3. Other specified dorsopathies, sacral and sacrococcygeal region   4. Chronic hip pain (Primary Source of Pain) (intermittent) (Left)   5. History of left hip replacement   6. Chronic sacroiliac joint pain (Secondary source of pain) (intermittent) (Left)   7. Chronic lumbar radicular pain [facility] source of pain) (intermittent) (L4 Dermatome) (Left)   8. Chronic low back pain (Bilateral) (L>R) w/ sciatica (Left)   9. Lumbar lateral recess stenosis (L4-5) (Left)   10. Lumbar facet hypertrophy (L4-5) (Left)   11. Osteoarthritis of hip (bone-on-bone) (Left)   12. Chronic myofascial pain syndrome (Right Gluteous Muscle)   13. Chronic pain syndrome     Problems updated and reviewed during this visit: No problems updated. Plan of Care  Pharmacotherapy (Medications Ordered): No orders of the defined types were placed in this encounter.  Medications administered today: Shannon [name] had no medications administered during this visit.   Procedure Orders     SACROILIAC JOINT  INJECTION Lab Orders  No laboratory test(s) ordered today   Imaging Orders  No imaging studies ordered today   Referral Orders  No referral(s) requested today  Interventional management options: Planned, scheduled, and/or pending:   Diagnostic right-sided sacroiliac joint block    Considering:   Diagnostic right-sided sacroiliac joint block  Palliative right-sided L4-5 lumbar epidural steroid injection  Palliative left-sided sacroiliac joint block    Palliative PRN treatment(s):   Palliative right gluteal MNB Diagnostic right-sided sacroiliac joint block  Palliative right-sided L4-5 lumbar epidural steroid injection  Palliative left-sided sacroiliac joint block    Provider-requested follow-up: Return for Procedure (no sedation): (R) SI BLK.  Future Appointments  Date Time Provider [place]  08/31/2017  1:30 PM [name], MD [facility]   Primary Care Physician: [name], MD Location: [facility] Note by: Gaspar Cola, MD Date: 06/15/2017; Time: 7:56 AM

## 2017-06-16 DIAGNOSIS — J301 Allergic rhinitis due to pollen: Secondary | ICD-10-CM | POA: Diagnosis not present

## 2017-06-16 DIAGNOSIS — J3089 Other allergic rhinitis: Secondary | ICD-10-CM | POA: Diagnosis not present

## 2017-06-16 NOTE — Telephone Encounter (Signed)
Rx has been sent in already

## 2017-06-25 ENCOUNTER — Other Ambulatory Visit (HOSPITAL_COMMUNITY)
Admission: RE | Admit: 2017-06-25 | Discharge: 2017-06-25 | Disposition: A | Discharge status: Home / Self Care | Payer: 59 | Source: Ambulatory Visit | Attending: Internal Medicine | Admitting: Internal Medicine

## 2017-06-25 ENCOUNTER — Ambulatory Visit: Payer: 59 | Admitting: Internal Medicine

## 2017-06-25 ENCOUNTER — Encounter

## 2017-06-25 VITALS — BP 110/72 | HR 86 | Temp 98.0°F | Resp 18 | Wt 131.6 lb

## 2017-06-25 DIAGNOSIS — N760 Acute vaginitis: Secondary | ICD-10-CM

## 2017-06-25 DIAGNOSIS — R3915 Urgency of urination: Secondary | ICD-10-CM

## 2017-06-25 DIAGNOSIS — R300 Dysuria: Secondary | ICD-10-CM

## 2017-06-25 LAB — POCT URINALYSIS DIPSTICK
Bilirubin, UA: NEGATIVE
Blood, UA: NEGATIVE
Glucose, UA: NEGATIVE
Ketones, UA: NEGATIVE
LEUKOCYTES UA: NEGATIVE
Nitrite, UA: NEGATIVE
Protein, UA: NEGATIVE
Urobilinogen, UA: 0.2 E.U./dL
pH, UA: 6 (ref 5.0–8.0)

## 2017-06-25 LAB — URINALYSIS, MICROSCOPIC ONLY
RBC / HPF: NONE SEEN (ref 0–?)
WBC UA: NONE SEEN (ref 0–?)

## 2017-06-25 MED ORDER — NYSTATIN 100000 UNIT/GM EX CREA: 1.0000 "application " | TOPICAL_CREAM | Freq: Two times a day (BID) | CUTANEOUS | 0 refills | Status: DC

## 2017-06-25 MED ORDER — PHENAZOPYRIDINE HCL 200 MG PO TABS
200.0000 mg | ORAL_TABLET | Freq: Three times a day (TID) | ORAL | 0 refills | Status: DC | PRN
Start: 2017-06-25 — End: 2017-07-21

## 2017-06-25 NOTE — Progress Notes (Signed)
Patient ID: Shannon [name], female   DOB: 1965-12-23, 52 y.o.   MRN: [id number]   Subjective:    Patient ID: Shannon [name], female    DOB: 01-07-1966, 52 y.o.   MRN: [id number]  HPI  Patient here as a work in with concerns regarding some vaginal burning and possible uti.  States symptoms started over the past week with urinary urgency.  Feels raw.  Sweat burns the area.  No vaginal discharge.  She has increased her water intake.  Started drinking cranberry juice.  Used A&D ointment.  No abdominal pain.  Eating and drinking.  No vomiting.     Past Medical History:  Diagnosis Date  . Anemia   . Anxiety   . Breast pain, right 11/07/2012  . Breathing difficulty 06/20/2014  . Environmental allergies   . GERD (gastroesophageal reflux disease)   . Hiatal hernia    small  . History of migraine headaches   . Hyperthyroidism    s/p ablation  . Vaginitis 01/16/2013   Past Surgical History:  Procedure Laterality Date  . BREAST BIOPSY Right 12/05/2015   neg  . CESAREAN SECTION  11/98   Dr Roena Malady  . CHOLECYSTECTOMY  11/05   Dr Pat Patrick  . JOINT REPLACEMENT Left 2017   hip  . NASAL SINUS SURGERY  11/93  . SEPTOPLASTY  5/90   Dr Rossie Muskrat  . TUBAL LIGATION  02/1998  . UMBILICAL HERNIA REPAIR  02/1998   Family History  Problem Relation Age of Onset  . Heart disease Father   . Hypertension Father   . Thyroid disease Father   . Hypercholesterolemia Father   . Kidney cancer Father   . Leukemia Father        hairy cell  . Hypercholesterolemia Mother   . Thyroid disease Mother   . Breast cancer Mother 26  . Breast cancer Unknown        maternal great grandmother  . Heart disease Maternal Grandfather        MI - age 60  . Prostate cancer Paternal Grandfather   . Pancreatic cancer Paternal Grandfather   . Lung cancer Paternal Grandfather   . Bladder Cancer Neg Hx    Social History   Socioeconomic History  . Marital status: Married    Spouse name: Not on file  . Number of children:  1  . Years of education: Not on file  . Highest education level: Not on file  Occupational History  . Not on file  Social Needs  . Financial resource strain: Not on file  . Food insecurity:    Worry: Not on file    Inability: Not on file  . Transportation needs:    Medical: Not on file    Non-medical: Not on file  Tobacco Use  . Smoking status: Former Smoker    Last attempt to quit: 01/28/2000    Years since quitting: 17.4  . Smokeless tobacco: Never Used  Substance and Sexual Activity  . Alcohol use: No    Alcohol/week: 0.0 oz  . Drug use: No  . Sexual activity: Not on file  Lifestyle  . Physical activity:    Days per week: Not on file    Minutes per session: Not on file  . Stress: Not on file  Relationships  . Social connections:    Talks on phone: Not on file    Gets together: Not on file    Attends religious service: Not on file  Active member of club or organization: Not on file    Attends meetings of clubs or organizations: Not on file    Relationship status: Not on file  Other Topics Concern  . Not on file  Social History Narrative  . Not on file     Review of Systems  Constitutional: Negative for appetite change and fever.  Respiratory: Negative for cough and shortness of breath.   Cardiovascular: Negative for leg swelling.  Gastrointestinal: Negative for abdominal pain, diarrhea, nausea and vomiting.  Genitourinary:       Urinary urgency.  Vaginal burning when urine touches the vaginal area.  No vaginal discharge.    Musculoskeletal: Negative for back pain and myalgias.  Skin: Negative for wound.       Noticed some redness in the vaginal area.    Psychiatric/Behavioral: Negative for agitation and dysphoric mood.       Objective:    Physical Exam  Constitutional: She appears well-developed and well-nourished. No distress.  Cardiovascular: Normal rate and regular rhythm.  Pulmonary/Chest: Breath sounds normal. No respiratory distress. She has no  wheezes.  Abdominal: Soft. Bowel sounds are normal. There is no tenderness.  Genitourinary:  Genitourinary Comments: Normal external genitalia.  Some minimal peri vaginal erythema.  Vaginal vault without lesions.  Scant discharge present.  KOH/wet prep obtained.  Could not appreciate any adnexal masses or tenderness.    Musculoskeletal:  No CVA tenderness.    Psychiatric: She has a normal mood and affect. Her behavior is normal.    BP 110/72 (BP Location: Left Arm, Patient Position: Sitting, Cuff Size: Normal)   Pulse 86   Temp 98 F (36.7 C) (Oral)   Resp 18   Wt 131 lb 9.6 oz (59.7 kg)   LMP 02/12/2013   SpO2 98%   BMI 24.07 kg/m  Wt Readings from Last 3 Encounters:  06/25/17 131 lb 9.6 oz (59.7 kg)  06/15/17 127 lb (57.6 kg)  03/02/17 132 lb 2 oz (59.9 kg)     Lab Results  Component Value Date   WBC 5.2 02/26/2017   HGB 14.4 02/26/2017   HCT 42.5 02/26/2017   PLT 241.0 02/26/2017   GLUCOSE 93 02/26/2017   CHOL 187 02/26/2017   TRIG 177.0 (H) 02/26/2017   HDL 64.00 02/26/2017   LDLCALC 88 02/26/2017   ALT 20 02/26/2017   AST 20 02/26/2017   NA 142 02/26/2017   K 4.3 02/26/2017   CL 104 02/26/2017   CREATININE 0.68 02/26/2017   BUN 14 02/26/2017   CO2 32 02/26/2017   TSH 2.37 02/26/2017       Assessment & Plan:   Problem List Items Addressed This Visit    Urinary urgency    Symptoms as outlined. Hold on abx.  Check urinalysis and culture to confirm no infection.  Follow.         Other Visit Diagnoses    Dysuria    -  Primary   Relevant Orders   POCT urinalysis dipstick (Completed)   Urine Culture (Completed)   Urine Microscopic (Completed)   Acute vaginitis       Vaginal burning with scant discharge.  Nystatin cream as directed.  KOH/wet prep sent. Await results.  Keep area dry.     Relevant Orders   Cervicovaginal ancillary only       [name], MD

## 2017-06-26 DIAGNOSIS — J3089 Other allergic rhinitis: Secondary | ICD-10-CM | POA: Diagnosis not present

## 2017-06-26 DIAGNOSIS — J301 Allergic rhinitis due to pollen: Secondary | ICD-10-CM | POA: Diagnosis not present

## 2017-06-26 LAB — URINE CULTURE
MICRO NUMBER: 90652544
SPECIMEN QUALITY: ADEQUATE

## 2017-06-28 ENCOUNTER — Encounter: Payer: Self-pay | Admitting: Internal Medicine

## 2017-06-28 DIAGNOSIS — R3915 Urgency of urination: Secondary | ICD-10-CM | POA: Insufficient documentation

## 2017-06-28 DIAGNOSIS — R350 Frequency of micturition: Secondary | ICD-10-CM

## 2017-06-28 DIAGNOSIS — R300 Dysuria: Secondary | ICD-10-CM

## 2017-06-28 NOTE — Assessment & Plan Note (Signed)
Symptoms as outlined. Hold on abx.  Check urinalysis and culture to confirm no infection.  Follow.

## 2017-06-29 ENCOUNTER — Encounter: Payer: Self-pay | Admitting: Internal Medicine

## 2017-06-29 LAB — CERVICOVAGINAL ANCILLARY ONLY: Wet Prep (BD Affirm): NEGATIVE

## 2017-06-30 NOTE — Telephone Encounter (Signed)
Order placed for urology referral.  

## 2017-07-02 ENCOUNTER — Other Ambulatory Visit: Payer: Self-pay

## 2017-07-02 ENCOUNTER — Ambulatory Visit (HOSPITAL_BASED_OUTPATIENT_CLINIC_OR_DEPARTMENT_OTHER): Payer: 59 | Admitting: Pain Medicine

## 2017-07-02 ENCOUNTER — Encounter: Payer: Self-pay | Admitting: Pain Medicine

## 2017-07-02 ENCOUNTER — Ambulatory Visit
Admission: RE | Admit: 2017-07-02 | Discharge: 2017-07-02 | Disposition: A | Discharge status: Home / Self Care | Payer: 59 | Source: Ambulatory Visit | Attending: Pain Medicine | Admitting: Pain Medicine

## 2017-07-02 VITALS — BP 133/73 | HR 70 | Temp 98.5°F | Resp 16 | Ht 62.0 in | Wt 128.0 lb

## 2017-07-02 DIAGNOSIS — M461 Sacroiliitis, not elsewhere classified: Secondary | ICD-10-CM

## 2017-07-02 DIAGNOSIS — Z882 Allergy status to sulfonamides status: Secondary | ICD-10-CM | POA: Diagnosis not present

## 2017-07-02 DIAGNOSIS — G8929 Other chronic pain: Secondary | ICD-10-CM | POA: Insufficient documentation

## 2017-07-02 DIAGNOSIS — M5388 Other specified dorsopathies, sacral and sacrococcygeal region: Secondary | ICD-10-CM

## 2017-07-02 DIAGNOSIS — M533 Sacrococcygeal disorders, not elsewhere classified: Secondary | ICD-10-CM | POA: Diagnosis not present

## 2017-07-02 DIAGNOSIS — M5442 Lumbago with sciatica, left side: Secondary | ICD-10-CM | POA: Diagnosis present

## 2017-07-02 DIAGNOSIS — Z881 Allergy status to other antibiotic agents status: Secondary | ICD-10-CM | POA: Diagnosis not present

## 2017-07-02 DIAGNOSIS — Z885 Allergy status to narcotic agent status: Secondary | ICD-10-CM | POA: Diagnosis not present

## 2017-07-02 DIAGNOSIS — M25551 Pain in right hip: Secondary | ICD-10-CM | POA: Diagnosis not present

## 2017-07-02 DIAGNOSIS — M47818 Spondylosis without myelopathy or radiculopathy, sacral and sacrococcygeal region: Secondary | ICD-10-CM

## 2017-07-02 MED ORDER — LIDOCAINE HCL 2 % IJ SOLN
20.0000 mL | Freq: Once | INTRAMUSCULAR | Status: DC
  Administered 2017-07-02: 400 mg
  Filled 2017-07-02: qty 40

## 2017-07-02 MED ORDER — METHYLPREDNISOLONE ACETATE 80 MG/ML IJ SUSP
80.0000 mg | Freq: Once | INTRAMUSCULAR | Status: DC
  Administered 2017-07-02: 80 mg via INTRA_ARTICULAR
  Filled 2017-07-02: qty 1

## 2017-07-02 MED ORDER — ROPIVACAINE HCL 2 MG/ML IJ SOLN
4.0000 mL | Freq: Once | INTRAMUSCULAR | Status: DC
  Administered 2017-07-02: 4 mL via INTRA_ARTICULAR
  Filled 2017-07-02: qty 10

## 2017-07-02 NOTE — Progress Notes (Addendum)
Patient's Name: Shannon [name]  MRN: [id number]  Referring Provider: Milinda Pointer, MD  DOB: [birth year]  PCP: [name], MD  DOS: 07/02/2017  Note by: Gaspar Cola, MD  Service setting: Ambulatory outpatient  Specialty: Interventional Pain Management  Patient type: Established  Location: [facility] (AMB) Pain Management Facility  Visit type: Interventional Procedure   Primary Reason for Visit: Interventional Pain Management Treatment. CC: Hip Pain (right)  Procedure:  Anesthesia, Analgesia, Anxiolysis:  Type: Palliative Sacroiliac Joint Steroid Injection          Region: Superior Lumbosacral Region Level: PSIS (Posterior Superior Iliac Spine) Laterality: Right-Sided  Type: Local Anesthesia Indication(s): Analgesia         Route: Infiltration ([place]/IM) IV Access: Declined Sedation: Declined  Local Anesthetic: Lidocaine 1-2%   Indications: 1. Other specified dorsopathies, sacral and sacrococcygeal region   2. Chronic sacroiliac joint pain (Right)   3. Chronic low back pain (Bilateral) (L>R) w/ sciatica (Left)    Pain Score: Pre-procedure: 2 /10 Post-procedure: 0-No pain/10  Pre-op Assessment:  Ms. Postell is a 52 y.o. (year old), female patient, seen today for interventional treatment. She  has a past surgical history that includes Septoplasty (5/90); Nasal sinus surgery (11/93); Cesarean section (11/98); Tubal ligation (02/1998); Umbilical hernia repair (02/1998); Cholecystectomy (11/05); Joint replacement (Left, 2017); and Breast biopsy (Right, 12/05/2015). Ms. Siemen has a current medication list which includes the following prescription(s): acetaminophen, alprazolam, nystatin cream, omeprazole, phenazopyridine, and ranitidine, and the following Facility-Administered Medications: lidocaine, methylprednisolone acetate, and ropivacaine (pf) 2 mg/ml (0.2%). Her primarily concern today is the Hip Pain (right)  Initial Vital Signs:  Pulse/HCG Rate: 68  Temp: 98.5 F (36.9 C) Resp:  16 BP: 123/89 SpO2: 100 %  BMI: Estimated body mass index is 23.41 kg/m as calculated from the following:   Height as of this encounter: 5\' 2"  (1.575 m).   Weight as of this encounter: 128 lb (58.1 kg).  Risk Assessment: Allergies: Reviewed. She is allergic to codeine; decongest-aid [pseudoephedrine]; flagyl [metronidazole]; and sulfa antibiotics.  Allergy Precautions: None required Coagulopathies: Reviewed. None identified.  Blood-thinner therapy: None at this time Active Infection(s): Reviewed. None identified. Ms. Murdy is afebrile  Site Confirmation: Ms. Lemler was asked to confirm the procedure and laterality before marking the site Procedure checklist: Completed Consent: Before the procedure and under the influence of no sedative(s), amnesic(s), or anxiolytics, the patient was informed of the treatment options, risks and possible complications. To fulfill our ethical and legal obligations, as recommended by the American Medical Association's Code of Ethics, I have informed the patient of my clinical impression; the nature and purpose of the treatment or procedure; the risks, benefits, and possible complications of the intervention; the alternatives, including doing nothing; the risk(s) and benefit(s) of the alternative treatment(s) or procedure(s); and the risk(s) and benefit(s) of doing nothing. The patient was provided information about the general risks and possible complications associated with the procedure. These may include, but are not limited to: failure to achieve desired goals, infection, bleeding, organ or nerve damage, allergic reactions, paralysis, and death. In addition, the patient was informed of those risks and complications associated to the procedure, such as failure to decrease pain; infection; bleeding; organ or nerve damage with subsequent damage to sensory, motor, and/or autonomic systems, resulting in permanent pain, numbness, and/or weakness of one or several areas  of the body; allergic reactions; (i.e.: anaphylactic reaction); and/or death. Furthermore, the patient was informed of those risks and complications associated with the medications. These  include, but are not limited to: allergic reactions (i.e.: anaphylactic or anaphylactoid reaction(s)); adrenal axis suppression; blood sugar elevation that in diabetics may result in ketoacidosis or comma; water retention that in patients with history of congestive heart failure may result in shortness of breath, pulmonary edema, and decompensation with resultant heart failure; weight gain; swelling or edema; medication-induced neural toxicity; particulate matter embolism and blood vessel occlusion with resultant organ, and/or nervous system infarction; and/or aseptic necrosis of one or more joints. Finally, the patient was informed that Medicine is not an exact science; therefore, there is also the possibility of unforeseen or unpredictable risks and/or possible complications that may result in a catastrophic outcome. The patient indicated having understood very clearly. We have given the patient no guarantees and we have made no promises. Enough time was given to the patient to ask questions, all of which were answered to the patient's satisfaction. Ms. Mcaleer has indicated that she wanted to continue with the procedure. Attestation: I, the ordering provider, attest that I have discussed with the patient the benefits, risks, side-effects, alternatives, likelihood of achieving goals, and potential problems during recovery for the procedure that I have provided informed consent. Date  Time: 07/02/2017  9:22 AM  Pre-Procedure Preparation:  Monitoring: As per clinic protocol. Respiration, ETCO2, SpO2, BP, heart rate and rhythm monitor placed and checked for adequate function Safety Precautions: Patient was assessed for positional comfort and pressure points before starting the procedure. Time-out: I initiated and conducted the  "Time-out" before starting the procedure, as per protocol. The patient was asked to participate by confirming the accuracy of the "Time Out" information. Verification of the correct person, site, and procedure were performed and confirmed by me, the nursing staff, and the patient. "Time-out" conducted as per Joint Commission's Universal Protocol (UP.01.01.01). Time: 0942  Description of Procedure:       Position: Prone Target Area: Superior, posterior, aspect of the sacroiliac fissure Approach: Posterior, paraspinal, ipsilateral approach. Area Prepped: Entire Lower Lumbosacral Region Prepping solution: ChloraPrep (2% chlorhexidine gluconate and 70% isopropyl alcohol) Safety Precautions: Aspiration looking for blood return was conducted prior to all injections. At no point did we inject any substances, as a needle was being advanced. No attempts were made at seeking any paresthesias. Safe injection practices and needle disposal techniques used. Medications properly checked for expiration dates. SDV (single dose vial) medications used. Description of the Procedure: Protocol guidelines were followed. The patient was placed in position over the procedure table. The target area was identified and the area prepped in the usual manner. Skin & deeper tissues infiltrated with local anesthetic. Appropriate amount of time allowed to pass for local anesthetics to take effect. The procedure needle was advanced under fluoroscopic guidance into the sacroiliac joint until a firm endpoint was obtained. Proper needle placement secured. Negative aspiration confirmed. Solution injected in intermittent fashion, asking for systemic symptoms every 0.5cc of injectate. The needles were then removed and the area cleansed, making sure to leave some of the prepping solution back to take advantage of its long term bactericidal properties. Vitals:   07/02/17 0921 07/02/17 0942 07/02/17 0949  BP: 123/89 132/75 133/73  Pulse: 68 71  70  Resp: 16 16 16   Temp: 98.5 F (36.9 C)    SpO2: 100% 100% (!) 10%  Weight: 128 lb (58.1 kg)    Height: 5\' 2"  (1.575 m)      Start Time: 0942 hrs. End Time: 0946 hrs. Materials:  Needle(s) Type: Regular needle Gauge: 22G Length:  3.5-in Medication(s): Please see orders for medications and dosing details.  Imaging Guidance (Non-Spinal):  Type of Imaging Technique: Fluoroscopy Guidance (Non-Spinal) Indication(s): Assistance in needle guidance and placement for procedures requiring needle placement in or near specific anatomical locations not easily accessible without such assistance. Exposure Time: Please see nurses notes. Contrast: Before injecting any contrast, we confirmed that the patient did not have an allergy to iodine, shellfish, or radiological contrast. Once satisfactory needle placement was completed at the desired level, radiological contrast was injected. Contrast injected under live fluoroscopy. No contrast complications. See chart for type and volume of contrast used. Fluoroscopic Guidance: I was personally present during the use of fluoroscopy. "Tunnel Vision Technique" used to obtain the best possible view of the target area. Parallax error corrected before commencing the procedure. "Direction-depth-direction" technique used to introduce the needle under continuous pulsed fluoroscopy. Once target was reached, antero-posterior, oblique, and lateral fluoroscopic projection used confirm needle placement in all planes. Images permanently stored in EMR. Interpretation: I personally interpreted the imaging intraoperatively. Adequate needle placement confirmed in multiple planes. Appropriate spread of contrast into desired area was observed. No evidence of afferent or efferent intravascular uptake. Permanent images saved into the patient's record.  Antibiotic Prophylaxis:   Anti-infectives (From admission, onward)   None     Indication(s): None identified  Post-operative  Assessment:  Post-procedure Vital Signs:  Pulse/HCG Rate: 70  Temp: 98.5 F (36.9 C) Resp: 16 BP: 133/73 SpO2: (!) 10 %  EBL: None  Complications: No immediate post-treatment complications observed by team, or reported by patient.  Note: The patient tolerated the entire procedure well. A repeat set of vitals were taken after the procedure and the patient was kept under observation following institutional policy, for this type of procedure. Post-procedural neurological assessment was performed, showing return to baseline, prior to discharge. The patient was provided with post-procedure discharge instructions, including a section on how to identify potential problems. Should any problems arise concerning this procedure, the patient was given instructions to immediately contact us, at any time, without hesitation. In any case, we plan to contact the patient by telephone for a follow-up status report regarding this interventional procedure.  Comments:  No additional relevant information.  Plan of Care    Imaging Orders     DG C-Arm 1-60 Min-No Report  Procedure Orders     SACROILIAC JOINT INJECTION     SACROILIAC JOINT INJECTION  Medications ordered for procedure: Meds ordered this encounter  Medications  . lidocaine (XYLOCAINE) 2 % (with pres) injection 400 mg  . ropivacaine (PF) 2 mg/mL (0.2%) (NAROPIN) injection 4 mL  . methylPREDNISolone acetate (DEPO-MEDROL) injection 80 mg   Medications administered: [name] had no medications administered during this visit.  See the medical record for exact dosing, route, and time of administration.  New Prescriptions   No medications on file   Disposition: Discharge home  Discharge Date & Time: 07/02/2017; 0949 hrs.   Physician-requested Follow-up: Return for post-procedure eval (2 wks), w/ Dionisio David, NP.  Future Appointments  Date Time Provider [place]  07/20/2017 10:15 AM Milinda Pointer, MD [facility] None   07/21/2017  3:00 PM McGowan, Hunt Oris, PA-C [facility] None  08/31/2017  1:30 PM [name], MD [facility]   Primary Care Physician: [name], MD Location: [facility] Note by: Gaspar Cola, MD Date: 07/02/2017; Time: 10:03 AM  Disclaimer:  Medicine is not an exact science. The only guarantee in medicine is that nothing  is guaranteed. It is important to note that the decision to proceed with this intervention was based on the information collected from the patient. The Data and conclusions were drawn from the patient's questionnaire, the interview, and the physical examination. Because the information was provided in large part by the patient, it cannot be guaranteed that it has not been purposely or unconsciously manipulated. Every effort has been made to obtain as much relevant data as possible for this evaluation. It is important to note that the conclusions that lead to this procedure are derived in large part from the available data. Always take into account that the treatment will also be dependent on availability of resources and existing treatment guidelines, considered by other Pain Management Practitioners as being common knowledge and practice, at the time of the intervention. For Medico-Legal purposes, it is also important to point out that variation in procedural techniques and pharmacological choices are the acceptable norm. The indications, contraindications, technique, and results of the above procedure should only be interpreted and judged by a Board-Certified Interventional Pain Specialist with extensive familiarity and expertise in the same exact procedure and technique.

## 2017-07-02 NOTE — Patient Instructions (Signed)

## 2017-07-02 NOTE — Progress Notes (Signed)
Safety precautions to be maintained throughout the outpatient stay will include: orient to surroundings, keep bed in low position, maintain call bell within reach at all times, provide assistance with transfer out of bed and ambulation.  

## 2017-07-03 ENCOUNTER — Telehealth: Payer: Self-pay

## 2017-07-03 DIAGNOSIS — J3089 Other allergic rhinitis: Secondary | ICD-10-CM | POA: Diagnosis not present

## 2017-07-03 DIAGNOSIS — J301 Allergic rhinitis due to pollen: Secondary | ICD-10-CM | POA: Diagnosis not present

## 2017-07-03 NOTE — Telephone Encounter (Signed)
Post procedure phone call.  Patient states she is doing well.  

## 2017-07-09 DIAGNOSIS — J3089 Other allergic rhinitis: Secondary | ICD-10-CM | POA: Diagnosis not present

## 2017-07-09 DIAGNOSIS — J301 Allergic rhinitis due to pollen: Secondary | ICD-10-CM | POA: Diagnosis not present

## 2017-07-13 ENCOUNTER — Other Ambulatory Visit: Payer: Self-pay | Admitting: Internal Medicine

## 2017-07-14 DIAGNOSIS — J3089 Other allergic rhinitis: Secondary | ICD-10-CM | POA: Diagnosis not present

## 2017-07-14 DIAGNOSIS — J301 Allergic rhinitis due to pollen: Secondary | ICD-10-CM | POA: Diagnosis not present

## 2017-07-15 NOTE — Telephone Encounter (Signed)
Last OV 06/25/17 Next OV 08/31/17 Last refill 06/11/17

## 2017-07-16 NOTE — Telephone Encounter (Signed)
I do not mind refilling the medication if due.  Need to clarify.  In reviewing history, it appears last rx - 06/11/17 with #30 with one refill.  Need to clarify with pharmacy/pt that she is actually out.

## 2017-07-16 NOTE — Telephone Encounter (Signed)
Left message for patient to call back  

## 2017-07-20 ENCOUNTER — Telehealth: Payer: Self-pay | Admitting: *Deleted

## 2017-07-20 ENCOUNTER — Other Ambulatory Visit: Payer: Self-pay

## 2017-07-20 ENCOUNTER — Ambulatory Visit: Payer: 59 | Attending: Pain Medicine | Admitting: Pain Medicine

## 2017-07-20 ENCOUNTER — Encounter: Payer: Self-pay | Admitting: Pain Medicine

## 2017-07-20 VITALS — BP 128/73 | HR 60 | Temp 97.8°F | Resp 16 | Ht 62.0 in | Wt 126.0 lb

## 2017-07-20 DIAGNOSIS — N950 Postmenopausal bleeding: Secondary | ICD-10-CM | POA: Diagnosis not present

## 2017-07-20 DIAGNOSIS — E0590 Thyrotoxicosis, unspecified without thyrotoxic crisis or storm: Secondary | ICD-10-CM | POA: Diagnosis not present

## 2017-07-20 DIAGNOSIS — M47898 Other spondylosis, sacral and sacrococcygeal region: Secondary | ICD-10-CM | POA: Diagnosis not present

## 2017-07-20 DIAGNOSIS — Z8249 Family history of ischemic heart disease and other diseases of the circulatory system: Secondary | ICD-10-CM | POA: Insufficient documentation

## 2017-07-20 DIAGNOSIS — M47817 Spondylosis without myelopathy or radiculopathy, lumbosacral region: Secondary | ICD-10-CM | POA: Diagnosis not present

## 2017-07-20 DIAGNOSIS — G894 Chronic pain syndrome: Secondary | ICD-10-CM | POA: Insufficient documentation

## 2017-07-20 DIAGNOSIS — D649 Anemia, unspecified: Secondary | ICD-10-CM | POA: Insufficient documentation

## 2017-07-20 DIAGNOSIS — Z801 Family history of malignant neoplasm of trachea, bronchus and lung: Secondary | ICD-10-CM | POA: Insufficient documentation

## 2017-07-20 DIAGNOSIS — Z881 Allergy status to other antibiotic agents status: Secondary | ICD-10-CM | POA: Insufficient documentation

## 2017-07-20 DIAGNOSIS — M545 Low back pain: Secondary | ICD-10-CM

## 2017-07-20 DIAGNOSIS — Z882 Allergy status to sulfonamides status: Secondary | ICD-10-CM | POA: Insufficient documentation

## 2017-07-20 DIAGNOSIS — Z9049 Acquired absence of other specified parts of digestive tract: Secondary | ICD-10-CM | POA: Diagnosis not present

## 2017-07-20 DIAGNOSIS — Z8042 Family history of malignant neoplasm of prostate: Secondary | ICD-10-CM | POA: Insufficient documentation

## 2017-07-20 DIAGNOSIS — M48061 Spinal stenosis, lumbar region without neurogenic claudication: Secondary | ICD-10-CM | POA: Insufficient documentation

## 2017-07-20 DIAGNOSIS — Z885 Allergy status to narcotic agent status: Secondary | ICD-10-CM | POA: Insufficient documentation

## 2017-07-20 DIAGNOSIS — G8929 Other chronic pain: Secondary | ICD-10-CM | POA: Diagnosis not present

## 2017-07-20 DIAGNOSIS — M533 Sacrococcygeal disorders, not elsewhere classified: Secondary | ICD-10-CM | POA: Diagnosis not present

## 2017-07-20 DIAGNOSIS — K219 Gastro-esophageal reflux disease without esophagitis: Secondary | ICD-10-CM | POA: Insufficient documentation

## 2017-07-20 DIAGNOSIS — M461 Sacroiliitis, not elsewhere classified: Secondary | ICD-10-CM

## 2017-07-20 DIAGNOSIS — Z87891 Personal history of nicotine dependence: Secondary | ICD-10-CM | POA: Diagnosis not present

## 2017-07-20 DIAGNOSIS — Z803 Family history of malignant neoplasm of breast: Secondary | ICD-10-CM | POA: Insufficient documentation

## 2017-07-20 DIAGNOSIS — M47818 Spondylosis without myelopathy or radiculopathy, sacral and sacrococcygeal region: Secondary | ICD-10-CM | POA: Diagnosis not present

## 2017-07-20 DIAGNOSIS — F419 Anxiety disorder, unspecified: Secondary | ICD-10-CM | POA: Diagnosis not present

## 2017-07-20 DIAGNOSIS — Z9889 Other specified postprocedural states: Secondary | ICD-10-CM | POA: Insufficient documentation

## 2017-07-20 DIAGNOSIS — Z79899 Other long term (current) drug therapy: Secondary | ICD-10-CM | POA: Diagnosis not present

## 2017-07-20 DIAGNOSIS — Z8051 Family history of malignant neoplasm of kidney: Secondary | ICD-10-CM | POA: Diagnosis not present

## 2017-07-20 DIAGNOSIS — M47816 Spondylosis without myelopathy or radiculopathy, lumbar region: Secondary | ICD-10-CM | POA: Diagnosis not present

## 2017-07-20 DIAGNOSIS — Z96642 Presence of left artificial hip joint: Secondary | ICD-10-CM

## 2017-07-20 NOTE — Telephone Encounter (Signed)
Copied from [place] [id number]. Topic: Inquiry >> Jul 20, 2017  8:28 AM Scherrie Gerlach wrote: Reason for CRM: pt states Larena Glassman called her X 2 last week.  [place]. Pt would like a call back.

## 2017-07-20 NOTE — Progress Notes (Signed)
Safety precautions to be maintained throughout the outpatient stay will include: orient to surroundings, keep bed in low position, maintain call bell within reach at all times, provide assistance with transfer out of bed and ambulation.  

## 2017-07-20 NOTE — Progress Notes (Signed)
07/21/2017 3:34 PM   Shannon [name] 1965-11-18 [id number]  Referring provider: [name], [address] [place], [place] [address]  Chief Complaint  Patient presents with  . Urinary Frequency    HPI: Patient is a 52 year old Caucasian female with a history of dysuria and urinary frequency who presents today for follow-up.  Background history Patient is a 52 year old Caucasian female who is referred by Dr. [name] for dysuria and frequency.   In May, she started with urgency, frequency, suprapubic pain and dribbling.  She was given Augmentin.  Did not get better.  Then was given something for vaginitis.  Then she was given something for a yeast infection.  She states now she is symptom free.  Her UA's, urine cultures, CG/Chlamydia cultures were negative.  Wet prep was positive for yeast.  She denies dysuria, gross hematuria, suprapubic pain, back pain, abdominal pain or flank pain.  She has not had any recent fevers, chills, nausea or vomiting.   She does not have a history of nephrolithiasis, GU surgery or GU trauma.  She is sexually active.  She is having pain with sexual activity.  She is postmenopausal.   She denies constipation and/or diarrhea.  She has not had any recent imaging studies.   She is drinking 5 to 6 bottles of water daily.  2-3 cups of decaf daily.  Some milk with cereal.  No juices.  No soft  drinks.    She states that she had been symptom-free until May 2018 where her symptoms returned again.  She was experiencing frequency, suprapubic pressure, suprapubic discomfort and painful intercourse.  She stated the pain became intense and at times she had to place a hot water bottle on the suprapubic region for comfort.  Her symptoms persisted for about 3 weeks and then abated.  Today she is currently symptom-free.  Patient denies any gross hematuria, dysuria or suprapubic/flank pain.  Patient denies any fevers, chills, nausea or vomiting.  Her  UA is negative.  Her PVR is 0 mL.    PMH: Past Medical History:  Diagnosis Date  . Anemia   . Anxiety   . Breast pain, right 11/07/2012  . Breathing difficulty 06/20/2014  . Environmental allergies   . GERD (gastroesophageal reflux disease)   . Hiatal hernia    small  . History of migraine headaches   . Hyperthyroidism    s/p ablation  . Vaginitis 01/16/2013    Surgical History: Past Surgical History:  Procedure Laterality Date  . BREAST BIOPSY Right 12/05/2015   neg  . CESAREAN SECTION  11/98   Dr Roena Malady  . CHOLECYSTECTOMY  11/05   Dr Pat Patrick  . JOINT REPLACEMENT Left 2017   hip  . NASAL SINUS SURGERY  11/93  . SEPTOPLASTY  5/90   Dr Rossie Muskrat  . TUBAL LIGATION  02/1998  . UMBILICAL HERNIA REPAIR  02/1998    Home Medications:  Allergies as of 07/21/2017      Reactions   Codeine Other (See Comments)   Severe Headache   Decongest-aid [pseudoephedrine] Other (See Comments)   Increased heart rate   Flagyl [metronidazole]    Sulfa Antibiotics       Medication List        Accurate as of 07/21/17  3:34 PM. Always use your most recent med list.          acetaminophen 500 MG tablet Commonly known as:  TYLENOL Take 1,000 mg by mouth  every 6 (six) hours as needed for moderate pain.   ALPRAZolam 0.25 MG tablet Commonly known as:  XANAX TAKE ONE TABLET AT BEDTIME AS NEEDED FORANXIETY   nystatin cream Commonly known as:  MYCOSTATIN Apply 1 application topically 2 (two) times daily.   omeprazole 40 MG capsule Commonly known as:  PRILOSEC Take 40 mg by mouth daily.   ranitidine 150 MG tablet Commonly known as:  ZANTAC Take 150 mg by mouth as needed.       Allergies:  Allergies  Allergen Reactions  . Codeine Other (See Comments)    Severe Headache  . Decongest-Aid [Pseudoephedrine] Other (See Comments)    Increased heart rate  . Flagyl [Metronidazole]   . Sulfa Antibiotics     Family History: Family History  Problem Relation Age of Onset  . Heart  disease Father   . Hypertension Father   . Thyroid disease Father   . Hypercholesterolemia Father   . Kidney cancer Father   . Leukemia Father        hairy cell  . Hypercholesterolemia Mother   . Thyroid disease Mother   . Breast cancer Mother 62  . Breast cancer Unknown        maternal great grandmother  . Heart disease Maternal Grandfather        MI - age 44  . Prostate cancer Paternal Grandfather   . Pancreatic cancer Paternal Grandfather   . Lung cancer Paternal Grandfather   . Bladder Cancer Neg Hx     Social History:  reports that she quit smoking about 17 years ago. She has never used smokeless tobacco. She reports that she does not drink alcohol or use drugs.  ROS: UROLOGY Frequent Urination?: Yes Hard to postpone urination?: No Burning/pain with urination?: No Get up at night to urinate?: No Leakage of urine?: No Urine stream starts and stops?: No Trouble starting stream?: No Do you have to strain to urinate?: No Blood in urine?: No Urinary tract infection?: No Sexually transmitted disease?: No Injury to kidneys or bladder?: No Painful intercourse?: Yes Weak stream?: No Currently pregnant?: No Vaginal bleeding?: No Last menstrual period?: n  Gastrointestinal Nausea?: No Vomiting?: No Indigestion/heartburn?: No Diarrhea?: No Constipation?: No  Constitutional Fever: No Night sweats?: No Weight loss?: No Fatigue?: No  Skin Skin rash/lesions?: No Itching?: No  Eyes Blurred vision?: No Double vision?: No  Ears/Nose/Throat Sore throat?: No Sinus problems?: No  Hematologic/Lymphatic Swollen glands?: No Easy bruising?: No  Cardiovascular Leg swelling?: No Chest pain?: No  Respiratory Cough?: No Shortness of breath?: No  Endocrine Excessive thirst?: No  Musculoskeletal Back pain?: No Joint pain?: No  Neurological Headaches?: No Dizziness?: No  Psychologic Depression?: No Anxiety?: No  Physical Exam: BP 133/77   Pulse 77    Ht 5\' 2"  (1.575 m)   Wt 126 lb (57.2 kg)   LMP 02/12/2013   BMI 23.05 kg/m   Constitutional: Well nourished. Alert and oriented, No acute distress. HEENT: [place] AT, moist mucus membranes. Trachea midline, no masses. Cardiovascular: No clubbing, cyanosis, or edema. Respiratory: Normal respiratory effort, no increased work of breathing. Skin: No rashes, bruises or suspicious lesions. Lymph: No cervical or inguinal adenopathy. Neurologic: Grossly intact, no focal deficits, moving all 4 extremities. Psychiatric: Normal mood and affect.   Laboratory Data: Lab Results  Component Value Date   WBC 5.2 02/26/2017   HGB 14.4 02/26/2017   HCT 42.5 02/26/2017   MCV 89.7 02/26/2017   PLT 241.0 02/26/2017    Lab  Results  Component Value Date   CREATININE 0.68 02/26/2017    Lab Results  Component Value Date   TSH 2.37 02/26/2017       Component Value Date/Time   CHOL 187 02/26/2017 0928   HDL 64.00 02/26/2017 0928   CHOLHDL 3 02/26/2017 0928   VLDL 35.4 02/26/2017 0928   LDLCALC 88 02/26/2017 0928    Lab Results  Component Value Date   AST 20 02/26/2017   Lab Results  Component Value Date   ALT 20 02/26/2017   I have reviewed the labs.   Pertinent Imaging: Results for AURELIE, DICENZO (MRN [id number]) as of 07/21/2016 15:06  Ref. Range 07/21/2016 14:52  Scan Result Unknown 31    Assessment & Plan:    1. Dysuria Has returned -UA is negative Symptoms appear to be cyclical occurring in the spring We will obtain a CT scan to rule out nephrolithiasis or other etiologies that may explain her pain I explained that if the CT scan was negative we will then need to pursue a cystoscopy I have explained to the patient that they will  be scheduled for a cystoscopy in our office to evaluate their bladder.  The cystoscopy consists of passing a tube with a lens up through their urethra and into their urinary bladder.   We will inject the urethra with a lidocaine gel prior to  introducing the cystoscope to help with any discomfort during the procedure.   After the procedure, they might experience blood in the urine and discomfort with urination.  This will abate after the first few voids.  I have  encouraged the patient to increase water intake  during this time.  Patient denies any allergies to lidocaine.   2. Suprapubic pain See above  3. Atrophic vaginitis Not wanting to start vaginal estrogen cream at this time  Return for I will call patient with results.  These notes generated with voice recognition software. I apologize for typographical errors.  Zara Council, PA-C  [facility] [address] [place], [place] [address] [phone]

## 2017-07-20 NOTE — Patient Instructions (Signed)
____________________________________________________________________________________________  Preparing for Procedure with Sedation  Instructions: . Oral Intake: Do not eat or drink anything for at least 8 hours prior to your procedure. . Transportation: Public transportation is not allowed. Bring an adult driver. The driver must be physically present in our waiting room before any procedure can be started. . Physical Assistance: Bring an adult physically capable of assisting you, in the event you need help. This adult should keep you company at home for at least 6 hours after the procedure. . Blood Pressure Medicine: Take your blood pressure medicine with a sip of water the morning of the procedure. . Blood thinners:  . Diabetics on insulin: Notify the staff so that you can be scheduled 1st case in the morning. If your diabetes requires high dose insulin, take only  of your normal insulin dose the morning of the procedure and notify the staff that you have done so. . Preventing infections: Shower with an antibacterial soap the morning of your procedure. . Build-up your immune system: Take 1000 mg of Vitamin C with every meal (3 times a day) the day prior to your procedure. . Antibiotics: Inform the staff if you have a condition or reason that requires you to take antibiotics before dental procedures. . Pregnancy: If you are pregnant, call and cancel the procedure. . Sickness: If you have a cold, fever, or any active infections, call and cancel the procedure. . Arrival: You must be in the facility at least 30 minutes prior to your scheduled procedure. . Children: Do not bring children with you. . Dress appropriately: Bring dark clothing that you would not mind if they get stained. . Valuables: Do not bring any jewelry or valuables.  Procedure appointments are reserved for interventional treatments only. . No Prescription Refills. . No medication changes will be discussed during procedure  appointments. . No disability issues will be discussed.  Remember:  Regular Business hours are:  Monday to Thursday 8:00 AM to 4:00 PM  Provider's Schedule: Loralei Radcliffe, MD:  Procedure days: Tuesday and Thursday 7:30 AM to 4:00 PM  Bilal Lateef, MD:  Procedure days: Monday and Wednesday 7:30 AM to 4:00 PM ____________________________________________________________________________________________    

## 2017-07-20 NOTE — Telephone Encounter (Signed)
Needed to know if patient needed xanax refill. Patient does not

## 2017-07-20 NOTE — Progress Notes (Signed)
Patient's Name: Shannon [name]  MRN: [id number]  Referring Provider: [name], MD  DOB: [birth year]  PCP: [name], MD  DOS: 07/20/2017  Note by: Gaspar Cola, MD  Service setting: Ambulatory outpatient  Specialty: Interventional Pain Management  Location: [facility] (AMB) Pain Management Facility    Patient type: Established   Primary Reason(s) for Visit: Encounter for post-procedure evaluation of chronic illness with mild to moderate exacerbation CC: Back Pain (lower)  HPI  Shannon [name] is a 52 y.o. year old, female patient, who comes today for a post-procedure evaluation. She has Hyperthyroidism; GERD (gastroesophageal reflux disease); Anemia; Migraine; Menopausal symptoms; Health care maintenance; Anxiety; Gastro-esophageal reflux disease without esophagitis; Chronic low back pain (Primary Area of Pain) (Bilateral) (R>L); Lumbar lateral recess stenosis (L4-5) (Left); Lumbar facet hypertrophy (L4-5) (Left); Chronic lumbar radicular pain [facility] source of pain) (intermittent) (L4 Dermatome) (Left); Chronic hip pain (resolved after hip replacement) (Left); Chronic sacroiliac joint pain (Secondary source of pain) (intermittent) (Left); Nicotine dependence; Tobacco abuse; Lumbar spondylosis; Encounter for chronic pain management; Post-menopausal bleeding; Axillary lump; Osteoarthritis of sacroiliac joint (Bilateral) (R>L); History of hip replacement (Left); Osteoarthritis of hip (Left); Chronic sacroiliac joint pain (Right); Chronic myofascial pain syndrome (Right Gluteous Muscle); Chronic pain syndrome; Other specified dorsopathies, sacral and sacrococcygeal region; Urinary urgency; Chronic Lumbar Facet syndrome (Right); and Spondylosis without myelopathy or radiculopathy, lumbosacral region on their problem list. Her primarily concern today is the Back Pain (lower)  Pain Assessment: Location: Right, Left, Lower Back Radiating: right upper leg sometimes Onset: More than a month  ago Duration: Chronic pain Quality: Aching Severity: 1 /10 (subjective, self-reported pain score)  Note: Reported level is compatible with observation.                         When using our objective Pain Scale, levels between 6 and 10/10 are said to belong in an emergency room, as it progressively worsens from a 6/10, described as severely limiting, requiring emergency care not usually available at an outpatient pain management facility. At a 6/10 level, communication becomes difficult and requires great effort. Assistance to reach the emergency department may be required. Facial flushing and profuse sweating along with potentially dangerous increases in heart rate and blood pressure will be evident. Effect on ADL:   Timing: Intermittent Modifying factors: heating pad BP: 128/73  HR: 60  Shannon [name] comes in today for post-procedure evaluation after the treatment done on 07/03/2017.  Further details on both, my assessment(s), as well as the proposed treatment plan, please see below.  Post-Procedure Assessment  07/02/2017 Procedure: Diagnostic right-sided sacroiliac joint blockunder fluoroscopic guidance, no sedation Pre-procedure pain score:  2/10 Post-procedure pain score: 0/10 (100% relief) Influential Factors: BMI: 23.05 kg/m Intra-procedural challenges: None observed.         Assessment challenges: None detected.              Reported side-effects: None.        Post-procedural adverse reactions or complications: None reported         Sedation: No sedation used. When no sedatives are used, the analgesic levels obtained are directly associated to the effectiveness of the local anesthetics. However, when sedation is provided, the level of analgesia obtained during the initial 1 hour following the intervention, is believed to be the result of a combination of factors. These factors may include, but are not limited to: 1. The effectiveness of the local anesthetics used. 2. The effects of the  analgesic(s) and/or anxiolytic(s) used. 3. The degree of discomfort experienced by the patient at the time of the procedure. 4. The patients ability and reliability in recalling and recording the events. 5. The presence and influence of possible secondary gains and/or psychosocial factors. Reported result: Relief experienced during the 1st hour after the procedure: 100 % (Ultra-Short Term Relief)            Interpretative annotation: Clinically appropriate result. Analgesia during this period is likely to be Local Anesthetic and/or IV Sedative (Analgesic/Anxiolytic) related.          Effects of local anesthetic: The analgesic effects attained during this period are directly associated to the localized infiltration of local anesthetics and therefore cary significant diagnostic value as to the etiological location, or anatomical origin, of the pain. Expected duration of relief is directly dependent on the pharmacodynamics of the local anesthetic used. Long-acting (4-6 hours) anesthetics used.  Reported result: Relief during the next 4 to 6 hour after the procedure: 100 % (Short-Term Relief)            Interpretative annotation: Clinically appropriate result. Analgesia during this period is likely to be Local Anesthetic-related.          Long-term benefit: Defined as the period of time past the expected duration of local anesthetics (1 hour for short-acting and 4-6 hours for long-acting). With the possible exception of prolonged sympathetic blockade from the local anesthetics, benefits during this period are typically attributed to, or associated with, other factors such as analgesic sensory neuropraxia, antiinflammatory effects, or beneficial biochemical changes provided by agents other than the local anesthetics.  Reported result: Extended relief following procedure: 100 % (Long-Term Relief)            Interpretative annotation: Clinically appropriate result. Good relief. Therapeutic success.  Inflammation plays a part in the etiology to the pain. Benefit believed to be steroid-related.  Current benefits: Defined as reported results that persistent at this point in time.   Analgesia: 100 %            Function: Ms. Locastro reports improvement in function ROM: Ms. Kruck reports improvement in ROM Interpretative annotation: Ongoing benefit. Therapeutic benefit observed. Effective palliative intervention. Benefit could be steroid-related.  Interpretation: Results would suggest a successful diagnostic intervention.                  Plan:  Set up procedure as a PRN palliative treatment option for this patient. However, the patient has been experiencing some pain in the right lower back, apparently unrelated to the SI joint pain. Physical exam today was positive for pain on hyperextension or rotation suggesting Lumbar facet syndrome. She does have some evidence of x-rays of facet joint hypertrophy. We'll plan on bringing her back for a diagnostic right-sided lumbar facet block under fluoroscopic guidance.          Laboratory Chemistry  Inflammation Markers (CRP: Acute Phase) (ESR: Chronic Phase) Lab Results  Component Value Date   ESRSEDRATE 8 05/22/2015                         Renal Markers Lab Results  Component Value Date   BUN 14 02/26/2017   CREATININE 0.68 02/26/2017   GFRAA >60 05/02/2014   GFRNONAA >60 05/02/2014                             Hepatic Markers Lab Results  Component Value Date   AST 20 02/26/2017   ALT 20 02/26/2017   ALBUMIN 4.3 02/26/2017                        Hematology Parameters Lab Results  Component Value Date   PLT 241.0 02/26/2017   HGB 14.4 02/26/2017   HCT 42.5 02/26/2017                        CV Markers Lab Results  Component Value Date   CKTOTAL 65 05/22/2015   TROPONINI <0.03 05/02/2014                         Note: Lab results reviewed.  Recent Diagnostic Imaging Results  DG C-Arm 1-60 Min-No Report Fluoroscopy was  utilized by the requesting physician.  No radiographic  interpretation.   Complexity Note: I personally reviewed the fluoroscopic imaging of the procedure.                        Meds   Current Outpatient Medications:  .  acetaminophen (TYLENOL) 500 MG tablet, Take 1,000 mg by mouth every 6 (six) hours as needed for moderate pain., Disp: , Rfl:  .  ALPRAZolam (XANAX) 0.25 MG tablet, TAKE ONE TABLET AT BEDTIME AS NEEDED FORANXIETY, Disp: 30 tablet, Rfl: 1 .  nystatin cream (MYCOSTATIN), Apply 1 application topically 2 (two) times daily., Disp: 30 g, Rfl: 0 .  omeprazole (PRILOSEC) 40 MG capsule, Take 40 mg by mouth daily., Disp: , Rfl:  .  phenazopyridine (PYRIDIUM) 200 MG tablet, Take 1 tablet (200 mg total) by mouth 3 (three) times daily as needed for pain., Disp: 6 tablet, Rfl: 0 .  ranitidine (ZANTAC) 150 MG tablet, Take 150 mg by mouth as needed. , Disp: , Rfl:   ROS  Constitutional: Denies any fever or chills Gastrointestinal: No reported hemesis, hematochezia, vomiting, or acute GI distress Musculoskeletal: Denies any acute onset joint swelling, redness, loss of ROM, or weakness Neurological: No reported episodes of acute onset apraxia, aphasia, dysarthria, agnosia, amnesia, paralysis, loss of coordination, or loss of consciousness  Allergies  Ms. Dake is allergic to codeine; decongest-aid [pseudoephedrine]; flagyl [metronidazole]; and sulfa antibiotics.  [place]  Drug: Ms. Arista  reports that she does not use drugs. Alcohol:  reports that she does not drink alcohol. Tobacco:  reports that she quit smoking about 17 years ago. She has never used smokeless tobacco. Medical:  has a past medical history of Anemia, Anxiety, Breast pain, right (11/07/2012), Breathing difficulty (06/20/2014), Environmental allergies, GERD (gastroesophageal reflux disease), Hiatal hernia, History of migraine headaches, Hyperthyroidism, and Vaginitis (01/16/2013). Surgical: Ms. Constantin  has a past surgical  history that includes Septoplasty (5/90); Nasal sinus surgery (11/93); Cesarean section (11/98); Tubal ligation (02/1998); Umbilical hernia repair (02/1998); Cholecystectomy (11/05); Joint replacement (Left, 2017); and Breast biopsy (Right, 12/05/2015). Family: family history includes Breast cancer in her unknown relative; Breast cancer (age of onset: 63) in her mother; Heart disease in her father and maternal grandfather; Hypercholesterolemia in her father and mother; Hypertension in her father; Kidney cancer in her father; Leukemia in her father; Lung cancer in her paternal grandfather; Pancreatic cancer in her paternal grandfather; Prostate cancer in her paternal grandfather; Thyroid disease in her father and mother.  Constitutional Exam  General appearance: Well nourished, well developed, and well hydrated. In no apparent acute distress Vitals:   07/20/17  1005  BP: 128/73  Pulse: 60  Resp: 16  Temp: 97.8 F (36.6 C)  TempSrc: Oral  SpO2: 100%  Weight: 126 lb (57.2 kg)  Height: _0  (1.575 m)   BMI Assessment: Estimated body mass index is 23.05 kg/m as calculated from the following:   Height as of this encounter: _1  (1.575 m).   Weight as of this encounter: 126 lb (57.2 kg).  BMI interpretation table: BMI level Category Range association with higher incidence of chronic pain  <18 kg/m2 Underweight   18.5-24.9 kg/m2 Ideal body weight   25-29.9 kg/m2 Overweight Increased incidence by 20%  30-34.9 kg/m2 Obese (Class I) Increased incidence by 68%  35-39.9 kg/m2 Severe obesity (Class II) Increased incidence by 136%  >40 kg/m2 Extreme obesity (Class III) Increased incidence by 254%   Patient's current BMI Ideal Body weight  Body mass index is 23.05 kg/m. Ideal body weight: 50.1 kg (110 lb 7.2 oz) Adjusted ideal body weight: 52.9 kg (116 lb 10.7 oz)   BMI Readings from Last 4 Encounters:  07/20/17 23.05 kg/m  07/02/17 23.41 kg/m  06/25/17 24.07 kg/m  06/15/17 23.23 kg/m    Wt Readings from Last 4 Encounters:  07/20/17 126 lb (57.2 kg)  07/02/17 128 lb (58.1 kg)  06/25/17 131 lb 9.6 oz (59.7 kg)  06/15/17 127 lb (57.6 kg)  Psych/Mental status: Alert, oriented x 3 (person, place, & time)       Eyes: PERLA Respiratory: No evidence of acute respiratory distress  Cervical Spine Area Exam  Skin & Axial Inspection: No masses, redness, edema, swelling, or associated skin lesions Alignment: Symmetrical Functional ROM: Unrestricted ROM      Stability: No instability detected Muscle Tone/Strength: Functionally intact. No obvious neuro-muscular anomalies detected. Sensory (Neurological): Unimpaired Palpation: No palpable anomalies              Upper Extremity (UE) Exam    Side: Right upper extremity  Side: Left upper extremity  Skin & Extremity Inspection: Skin color, temperature, and hair growth are WNL. No peripheral edema or cyanosis. No masses, redness, swelling, asymmetry, or associated skin lesions. No contractures.  Skin & Extremity Inspection: Skin color, temperature, and hair growth are WNL. No peripheral edema or cyanosis. No masses, redness, swelling, asymmetry, or associated skin lesions. No contractures.  Functional ROM: Unrestricted ROM          Functional ROM: Unrestricted ROM          Muscle Tone/Strength: Functionally intact. No obvious neuro-muscular anomalies detected.  Muscle Tone/Strength: Functionally intact. No obvious neuro-muscular anomalies detected.  Sensory (Neurological): Unimpaired          Sensory (Neurological): Unimpaired          Palpation: No palpable anomalies              Palpation: No palpable anomalies              Provocative Test(s):  Phalen's test: deferred Tinel's test: deferred Apley's scratch test (touch opposite shoulder):  Action 1 (Across chest): deferred Action 2 (Overhead): deferred Action 3 (LB reach): deferred   Provocative Test(s):  Phalen's test: deferred Tinel's test: deferred Apley's scratch test (touch  opposite shoulder):  Action 1 (Across chest): deferred Action 2 (Overhead): deferred Action 3 (LB reach): deferred    Thoracic Spine Area Exam  Skin & Axial Inspection: No masses, redness, or swelling Alignment: Symmetrical Functional ROM: Unrestricted ROM Stability: No instability detected Muscle Tone/Strength: Functionally intact. No obvious neuro-muscular anomalies detected. Sensory (  Neurological): Unimpaired Muscle strength & Tone: No palpable anomalies  Lumbar Spine Area Exam  Skin & Axial Inspection: No masses, redness, or swelling Alignment: Symmetrical Functional ROM: Unrestricted ROM       Stability: No instability detected Muscle Tone/Strength: Functionally intact. No obvious neuro-muscular anomalies detected. Sensory (Neurological): Movement-associated pain Palpation: No palpable anomalies       Provocative Tests: Lumbar Hyperextension/rotation test: (+) on the right for facet joint pain. Lumbar quadrant test (Kemp's test): (+) on the right for facet joint pain. Lumbar Lateral bending test: deferred today       Patrick's Maneuver: Improved after treatment                   FABER test: Improved after treatment       Thigh-thrust test: deferred today       S-I compression test: deferred today       S-I distraction test: deferred today        Gait & Posture Assessment  Ambulation: Unassisted Gait: Relatively normal for age and body habitus Posture: WNL   Lower Extremity Exam    Side: Right lower extremity  Side: Left lower extremity  Stability: No instability observed          Stability: No instability observed          Skin & Extremity Inspection: Skin color, temperature, and hair growth are WNL. No peripheral edema or cyanosis. No masses, redness, swelling, asymmetry, or associated skin lesions. No contractures.  Skin & Extremity Inspection: Skin color, temperature, and hair growth are WNL. No peripheral edema or cyanosis. No masses, redness, swelling, asymmetry, or  associated skin lesions. No contractures.  Functional ROM: Unrestricted ROM                  Functional ROM: Unrestricted ROM                  Muscle Tone/Strength: Functionally intact. No obvious neuro-muscular anomalies detected.  Muscle Tone/Strength: Functionally intact. No obvious neuro-muscular anomalies detected.  Sensory (Neurological): Unimpaired  Sensory (Neurological): Unimpaired  Palpation: No palpable anomalies  Palpation: No palpable anomalies   Assessment  Primary Diagnosis & Pertinent Problem List: The primary encounter diagnosis was Chronic low back pain (Primary Area of Pain) (Bilateral) (R>L). Diagnoses of Chronic sacroiliac joint pain (Right), Chronic Lumbar Facet syndrome (Right), Spondylosis without myelopathy or radiculopathy, lumbosacral region, Osteoarthritis of sacroiliac joint (Bilateral) (R>L), and History of hip replacement (Left) were also pertinent to this visit.  Status Diagnosis  Worsening Resolved Worsening 1. Chronic low back pain (Primary Area of Pain) (Bilateral) (R>L)   2. Chronic sacroiliac joint pain (Right)   3. Chronic Lumbar Facet syndrome (Right)   4. Spondylosis without myelopathy or radiculopathy, lumbosacral region   5. Osteoarthritis of sacroiliac joint (Bilateral) (R>L)   6. History of hip replacement (Left)     Problems updated and reviewed during this visit: Problem  Chronic Lumbar Facet syndrome (Right)  Spondylosis Without Myelopathy Or Radiculopathy, Lumbosacral Region  History of hip replacement (Left)  Osteoarthritis of hip (Left)  Osteoarthritis of sacroiliac joint (Bilateral) (R>L)  Chronic low back pain (Primary Area of Pain) (Bilateral) (R>L)  Chronic hip pain (resolved after hip replacement) (Left)   Plan of Care  Pharmacotherapy (Medications Ordered): No orders of the defined types were placed in this encounter.  Medications administered today: [name] had no medications administered during this  visit.   Procedure Orders     LUMBAR FACET(MEDIAL BRANCH  NERVE BLOCK) MB[facility] Lab Orders  No laboratory test(s) ordered today   Imaging Orders  No imaging studies ordered today   Referral Orders  No referral(s) requested today    Interventional management options: Planned, scheduled, and/or pending:   Diagnostic right lumbar facet block #1 under fluoro and IV sedation    Considering:   Diagnostic right-sided sacroiliac joint block Palliative right-sided L4-5 lumbar epidural steroid injection Palliative left-sided sacroiliac joint block   Palliative PRN treatment(s):   Palliativeright gluteal MNB Diagnostic right-sided sacroiliac joint block Palliative right-sided L4-5 lumbar epidural steroid injection Palliative left-sided sacroiliac joint block   Provider-requested follow-up: Return for Procedure (w/ sedation): (R) L-FCT Blk #1.  Future Appointments  Date Time Provider [place]  07/21/2017  3:00 PM Nori Riis, PA-C [facility] None  08/31/2017  1:30 PM [name], MD [facility]   Primary Care Physician: [name], MD Location: [facility] Out[facility] Facility Note by: Gaspar Cola, MD Date: 07/20/2017; Time: 11:48 AM

## 2017-07-21 ENCOUNTER — Encounter: Payer: Self-pay | Admitting: Urology

## 2017-07-21 ENCOUNTER — Ambulatory Visit: Payer: 59 | Admitting: Urology

## 2017-07-21 VITALS — BP 133/77 | HR 77 | Ht 62.0 in | Wt 126.0 lb

## 2017-07-21 DIAGNOSIS — R350 Frequency of micturition: Secondary | ICD-10-CM

## 2017-07-21 DIAGNOSIS — R1084 Generalized abdominal pain: Secondary | ICD-10-CM

## 2017-07-21 DIAGNOSIS — R1030 Lower abdominal pain, unspecified: Secondary | ICD-10-CM

## 2017-07-21 DIAGNOSIS — R300 Dysuria: Secondary | ICD-10-CM

## 2017-07-21 LAB — URINALYSIS, COMPLETE
BILIRUBIN UA: NEGATIVE
Glucose, UA: NEGATIVE
Ketones, UA: NEGATIVE
LEUKOCYTES UA: NEGATIVE
Nitrite, UA: NEGATIVE
PH UA: 6 (ref 5.0–7.5)
Protein, UA: NEGATIVE
Specific Gravity, UA: <1.005 — ABNORMAL LOW (ref 1.005–1.030)
UUROB: 0.2 mg/dL (ref 0.2–1.0)

## 2017-07-21 LAB — MICROSCOPIC EXAMINATION
Bacteria, UA: NONE SEEN
EPITHELIAL CELLS (NON RENAL): NONE SEEN /HPF (ref 0–10)
WBC, UA: NONE SEEN /hpf (ref 0–5)

## 2017-07-21 LAB — BLADDER SCAN AMB NON-IMAGING

## 2017-07-22 ENCOUNTER — Other Ambulatory Visit: Payer: Self-pay | Admitting: Urology

## 2017-07-22 DIAGNOSIS — R109 Unspecified abdominal pain: Secondary | ICD-10-CM

## 2017-07-23 DIAGNOSIS — J301 Allergic rhinitis due to pollen: Secondary | ICD-10-CM | POA: Diagnosis not present

## 2017-07-23 DIAGNOSIS — J3089 Other allergic rhinitis: Secondary | ICD-10-CM | POA: Diagnosis not present

## 2017-07-28 ENCOUNTER — Telehealth: Payer: Self-pay | Admitting: Family Medicine

## 2017-07-28 ENCOUNTER — Ambulatory Visit
Admission: RE | Admit: 2017-07-28 | Discharge: 2017-07-28 | Disposition: A | Discharge status: Home / Self Care | Payer: Commercial Managed Care - HMO | Source: Ambulatory Visit | Attending: Urology | Admitting: Urology

## 2017-07-28 DIAGNOSIS — J301 Allergic rhinitis due to pollen: Secondary | ICD-10-CM | POA: Diagnosis not present

## 2017-07-28 DIAGNOSIS — D259 Leiomyoma of uterus, unspecified: Secondary | ICD-10-CM | POA: Diagnosis not present

## 2017-07-28 DIAGNOSIS — R109 Unspecified abdominal pain: Secondary | ICD-10-CM | POA: Diagnosis not present

## 2017-07-28 DIAGNOSIS — J3089 Other allergic rhinitis: Secondary | ICD-10-CM | POA: Diagnosis not present

## 2017-07-28 NOTE — Telephone Encounter (Signed)
-----   Message from Nori Riis, PA-C sent at 07/28/2017  2:01 PM EDT ----- Please let Mrs. Honsinger know that her CT scan is negative.  We will need to schedule a cystoscopy at this time.

## 2017-07-28 NOTE — Telephone Encounter (Signed)
LMOM for patient to return call.

## 2017-07-29 NOTE — Telephone Encounter (Signed)
Pt informed, she has scheduled cysto.

## 2017-08-04 DIAGNOSIS — J3089 Other allergic rhinitis: Secondary | ICD-10-CM | POA: Diagnosis not present

## 2017-08-04 DIAGNOSIS — J301 Allergic rhinitis due to pollen: Secondary | ICD-10-CM | POA: Diagnosis not present

## 2017-08-06 DIAGNOSIS — R109 Unspecified abdominal pain: Secondary | ICD-10-CM | POA: Diagnosis not present

## 2017-08-06 DIAGNOSIS — K219 Gastro-esophageal reflux disease without esophagitis: Secondary | ICD-10-CM | POA: Diagnosis not present

## 2017-08-11 ENCOUNTER — Ambulatory Visit
Admission: RE | Admit: 2017-08-11 | Discharge: 2017-08-11 | Disposition: A | Discharge status: Home / Self Care | Payer: Commercial Managed Care - HMO | Source: Ambulatory Visit | Attending: Pain Medicine | Admitting: Pain Medicine

## 2017-08-11 ENCOUNTER — Ambulatory Visit (HOSPITAL_BASED_OUTPATIENT_CLINIC_OR_DEPARTMENT_OTHER): Payer: Commercial Managed Care - HMO | Admitting: Pain Medicine

## 2017-08-11 ENCOUNTER — Other Ambulatory Visit: Payer: Self-pay

## 2017-08-11 ENCOUNTER — Encounter: Payer: Self-pay | Admitting: Pain Medicine

## 2017-08-11 VITALS — BP 143/71 | HR 70 | Temp 98.2°F | Resp 12 | Ht 62.0 in | Wt 125.0 lb

## 2017-08-11 DIAGNOSIS — M545 Low back pain: Secondary | ICD-10-CM | POA: Diagnosis not present

## 2017-08-11 DIAGNOSIS — Z79899 Other long term (current) drug therapy: Secondary | ICD-10-CM | POA: Diagnosis not present

## 2017-08-11 DIAGNOSIS — M47897 Other spondylosis, lumbosacral region: Secondary | ICD-10-CM | POA: Insufficient documentation

## 2017-08-11 DIAGNOSIS — G8929 Other chronic pain: Secondary | ICD-10-CM | POA: Diagnosis not present

## 2017-08-11 DIAGNOSIS — M47816 Spondylosis without myelopathy or radiculopathy, lumbar region: Secondary | ICD-10-CM | POA: Insufficient documentation

## 2017-08-11 DIAGNOSIS — M47817 Spondylosis without myelopathy or radiculopathy, lumbosacral region: Secondary | ICD-10-CM | POA: Diagnosis not present

## 2017-08-11 DIAGNOSIS — M8938 Hypertrophy of bone, other site: Secondary | ICD-10-CM | POA: Insufficient documentation

## 2017-08-11 MED ORDER — LIDOCAINE HCL 2 % IJ SOLN
20.0000 mL | Freq: Once | INTRAMUSCULAR | Status: AC
  Administered 2017-08-11: 400 mg
  Filled 2017-08-11: qty 20

## 2017-08-11 MED ORDER — FENTANYL CITRATE (PF) 100 MCG/2ML IJ SOLN
25.0000 ug | INTRAMUSCULAR | Status: DC | PRN
  Filled 2017-08-11: qty 2

## 2017-08-11 MED ORDER — ROPIVACAINE HCL 2 MG/ML IJ SOLN
9.0000 mL | Freq: Once | INTRAMUSCULAR | Status: AC
  Administered 2017-08-11: 10 mL via PERINEURAL
  Filled 2017-08-11: qty 10

## 2017-08-11 MED ORDER — TRIAMCINOLONE ACETONIDE 40 MG/ML IJ SUSP
40.0000 mg | Freq: Once | INTRAMUSCULAR | Status: AC
  Administered 2017-08-11: 40 mg
  Filled 2017-08-11: qty 1

## 2017-08-11 MED ORDER — MIDAZOLAM HCL 5 MG/5ML IJ SOLN
1.0000 mg | INTRAMUSCULAR | Status: DC | PRN
  Filled 2017-08-11: qty 5

## 2017-08-11 MED ORDER — LACTATED RINGERS IV SOLN: 1000.0000 mL | Freq: Once | INTRAVENOUS | Status: DC

## 2017-08-11 NOTE — Progress Notes (Signed)
Patient's Name: Shannon [name]  MRN: [id number]  Referring Provider: Milinda Pointer, MD  DOB: 1965-08-03  PCP: [name], MD  DOS: 08/11/2017  Note by: Gaspar Cola, MD  Service setting: Ambulatory outpatient  Specialty: Interventional Pain Management  Patient type: Established  Location: [facility] (AMB) Pain Management Facility  Visit type: Interventional Procedure   Primary Reason for Visit: Interventional Pain Management Treatment. CC: Back Pain (right, lower)  Procedure:          Anesthesia, Analgesia, Anxiolysis:  Type: Lumbar Facet, Medial Branch Block(s) #1 (in the last 2 years)  Primary Purpose: Diagnostic Region: Posterolateral Lumbosacral Spine Level: L2, L3, L4, L5, & S1 Medial Branch Level(s). Injecting these levels blocks the L3-4, L4-5, and L5-S1 lumbar facet joints. Laterality: Right  Type: Local Anesthesia Indication(s): Analgesia         Route: Infiltration (Prichard/IM) IV Access: Secured Sedation: Declined  Local Anesthetic: Lidocaine 1-2%   Indications: 1. Spondylosis without myelopathy or radiculopathy, lumbosacral region   2. Lumbar Facet syndrome (Right)   3. Lumbar facet hypertrophy   4. Chronic low back pain (Primary Area of Pain) (Bilateral) (R>L)   5. Chronic Lumbar Facet syndrome (Right)    Pain Score: Pre-procedure: 2 /10 Post-procedure: 0-No pain/10  Pre-op Assessment:  [name] is a 52 y.o. (year old), female patient, seen today for interventional treatment. She  has a past surgical history that includes Septoplasty (5/90); Nasal sinus surgery (11/93); Cesarean section (11/98); Tubal ligation (02/1998); Umbilical hernia repair (02/1998); Cholecystectomy (11/05); Joint replacement (Left, 2017); and Breast biopsy (Right, 12/05/2015). [name] has a current medication list which includes the following prescription(s): acetaminophen, alprazolam, nystatin cream, omeprazole, and ranitidine. Her primarily concern today is the Back Pain (right,  lower)  Initial Vital Signs:  Pulse/HCG Rate: 70ECG Heart Rate: (!) 55 Temp: 98.2 F (36.8 C) Resp: 16 BP: 128/85 SpO2: 100 %  BMI: Estimated body mass index is 22.86 kg/m as calculated from the following:   Height as of this encounter: 5\' 2"  (1.575 m).   Weight as of this encounter: 125 lb (56.7 kg).  Risk Assessment: Allergies: Reviewed. She is allergic to codeine; decongest-aid [pseudoephedrine]; flagyl [metronidazole]; and sulfa antibiotics.  Allergy Precautions: None required Coagulopathies: Reviewed. None identified.  Blood-thinner therapy: None at this time Active Infection(s): Reviewed. None identified. [name] is afebrile  Site Confirmation: [name] was asked to confirm the procedure and laterality before marking the site Procedure checklist: Completed Consent: Before the procedure and under the influence of no sedative(s), amnesic(s), or anxiolytics, the patient was informed of the treatment options, risks and possible complications. To fulfill our ethical and legal obligations, as recommended by the American Medical Association's Code of Ethics, I have informed the patient of my clinical impression; the nature and purpose of the treatment or procedure; the risks, benefits, and possible complications of the intervention; the alternatives, including doing nothing; the risk(s) and benefit(s) of the alternative treatment(s) or procedure(s); and the risk(s) and benefit(s) of doing nothing. The patient was provided information about the general risks and possible complications associated with the procedure. These may include, but are not limited to: failure to achieve desired goals, infection, bleeding, organ or nerve damage, allergic reactions, paralysis, and death. In addition, the patient was informed of those risks and complications associated to Spine-related procedures, such as failure to decrease pain; infection (i.e.: Meningitis, epidural or intraspinal abscess); bleeding  (i.e.: epidural hematoma, subarachnoid hemorrhage, or any other type of intraspinal or peri-dural bleeding); organ  or nerve damage (i.e.: Any type of peripheral nerve, nerve root, or spinal cord injury) with subsequent damage to sensory, motor, and/or autonomic systems, resulting in permanent pain, numbness, and/or weakness of one or several areas of the body; allergic reactions; (i.e.: anaphylactic reaction); and/or death. Furthermore, the patient was informed of those risks and complications associated with the medications. These include, but are not limited to: allergic reactions (i.e.: anaphylactic or anaphylactoid reaction(s)); adrenal axis suppression; blood sugar elevation that in diabetics may result in ketoacidosis or comma; water retention that in patients with history of congestive heart failure may result in shortness of breath, pulmonary edema, and decompensation with resultant heart failure; weight gain; swelling or edema; medication-induced neural toxicity; particulate matter embolism and blood vessel occlusion with resultant organ, and/or nervous system infarction; and/or aseptic necrosis of one or more joints. Finally, the patient was informed that Medicine is not an exact science; therefore, there is also the possibility of unforeseen or unpredictable risks and/or possible complications that may result in a catastrophic outcome. The patient indicated having understood very clearly. We have given the patient no guarantees and we have made no promises. Enough time was given to the patient to ask questions, all of which were answered to the patient's satisfaction. [name] has indicated that she wanted to continue with the procedure. Attestation: I, the ordering provider, attest that I have discussed with the patient the benefits, risks, side-effects, alternatives, likelihood of achieving goals, and potential problems during recovery for the procedure that I have provided informed consent. Date   Time: 08/11/2017  8:58 AM  Pre-Procedure Preparation:  Monitoring: As per clinic protocol. Respiration, ETCO2, SpO2, BP, heart rate and rhythm monitor placed and checked for adequate function Safety Precautions: Patient was assessed for positional comfort and pressure points before starting the procedure. Time-out: I initiated and conducted the "Time-out" before starting the procedure, as per protocol. The patient was asked to participate by confirming the accuracy of the "Time Out" information. Verification of the correct person, site, and procedure were performed and confirmed by me, the nursing staff, and the patient. "Time-out" conducted as per Joint Commission's Universal Protocol (UP.01.01.01). Time: 1021  Description of Procedure:          Position: Prone Laterality: Right Levels:  L2, L3, L4, L5, & S1 Medial Branch Level(s) Area Prepped: Posterior Lumbosacral Region Prepping solution: ChloraPrep (2% chlorhexidine gluconate and 70% isopropyl alcohol) Safety Precautions: Aspiration looking for blood return was conducted prior to all injections. At no point did we inject any substances, as a needle was being advanced. Before injecting, the patient was told to immediately notify me if she was experiencing any new onset of "ringing in the ears, or metallic taste in the mouth". No attempts were made at seeking any paresthesias. Safe injection practices and needle disposal techniques used. Medications properly checked for expiration dates. SDV (single dose vial) medications used. After the completion of the procedure, all disposable equipment used was discarded in the proper designated medical waste containers. Local Anesthesia: Protocol guidelines were followed. The patient was positioned over the fluoroscopy table. The area was prepped in the usual manner. The time-out was completed. The target area was identified using fluoroscopy. A 12-in long, straight, sterile hemostat was used with fluoroscopic  guidance to locate the targets for each level blocked. Once located, the skin was marked with an approved surgical skin marker. Once all sites were marked, the skin (epidermis, dermis, and hypodermis), as well as deeper tissues (fat, connective tissue  and muscle) were infiltrated with a small amount of a short-acting local anesthetic, loaded on a 10cc syringe with a 25G, 1.5-in  Needle. An appropriate amount of time was allowed for local anesthetics to take effect before proceeding to the next step. Local Anesthetic: Lidocaine 2.0% The unused portion of the local anesthetic was discarded in the proper designated containers. Technical explanation of process:  L2 Medial Branch Nerve Block (MBB): The target area for the L2 medial branch is at the junction of the postero-lateral aspect of the superior articular process and the superior, posterior, and medial edge of the transverse process of L3. Under fluoroscopic guidance, a Quincke needle was inserted until contact was made with os over the superior postero-lateral aspect of the pedicular shadow (target area). After negative aspiration for blood, 0.5 mL of the nerve block solution was injected without difficulty or complication. The needle was removed intact. L3 Medial Branch Nerve Block (MBB): The target area for the L3 medial branch is at the junction of the postero-lateral aspect of the superior articular process and the superior, posterior, and medial edge of the transverse process of L4. Under fluoroscopic guidance, a Quincke needle was inserted until contact was made with os over the superior postero-lateral aspect of the pedicular shadow (target area). After negative aspiration for blood, 0.5 mL of the nerve block solution was injected without difficulty or complication. The needle was removed intact. L4 Medial Branch Nerve Block (MBB): The target area for the L4 medial branch is at the junction of the postero-lateral aspect of the superior articular  process and the superior, posterior, and medial edge of the transverse process of L5. Under fluoroscopic guidance, a Quincke needle was inserted until contact was made with os over the superior postero-lateral aspect of the pedicular shadow (target area). After negative aspiration for blood, 0.5 mL of the nerve block solution was injected without difficulty or complication. The needle was removed intact. L5 Medial Branch Nerve Block (MBB): The target area for the L5 medial branch is at the junction of the postero-lateral aspect of the superior articular process and the superior, posterior, and medial edge of the sacral ala. Under fluoroscopic guidance, a Quincke needle was inserted until contact was made with os over the superior postero-lateral aspect of the pedicular shadow (target area). After negative aspiration for blood, 0.5 mL of the nerve block solution was injected without difficulty or complication. The needle was removed intact. S1 Medial Branch Nerve Block (MBB): The target area for the S1 medial branch is at the posterior and inferior 6 o'clock position of the L5-S1 facet joint. Under fluoroscopic guidance, the Quincke needle inserted for the L5 MBB was redirected until contact was made with os over the inferior and postero aspect of the sacrum, at the 6 o' clock position under the L5-S1 facet joint (Target area). After negative aspiration for blood, 0.5 mL of the nerve block solution was injected without difficulty or complication. The needle was removed intact. Procedural Needles: 22-gauge, 3.5-inch, Quincke needles used for all levels. Nerve block solution: 0.2% PF-Ropivacaine + Triamcinolone (40 mg/mL) diluted to a final concentration of 4 mg of Triamcinolone/mL of Ropivacaine The unused portion of the solution was discarded in the proper designated containers.  Once the entire procedure was completed, the treated area was cleaned, making sure to leave some of the prepping solution back to  take advantage of its long term bactericidal properties.   Illustration of the posterior view of the lumbar spine and the posterior neural  structures. Laminae of L2 through S1 are labeled. DPRL5, dorsal primary ramus of L5; DPRS1, dorsal primary ramus of S1; DPR3, dorsal primary ramus of L3; FJ, facet (zygapophyseal) joint L3-L4; I, inferior articular process of L4; LB1, lateral branch of dorsal primary ramus of L1; IAB, inferior articular branches from L3 medial branch (supplies L4-L5 facet joint); IBP, intermediate branch plexus; MB3, medial branch of dorsal primary ramus of L3; NR3, third lumbar nerve root; S, superior articular process of L5; SAB, superior articular branches from L4 (supplies L4-5 facet joint also); TP3, transverse process of L3.  Vitals:   08/11/17 1015 08/11/17 1021 08/11/17 1026 08/11/17 1030  BP: 135/82 127/73 137/71 (!) 143/71  Pulse:      Resp: (!) 9 12 17 12   Temp:      TempSrc:      SpO2: 100% 100% 100% 100%  Weight:      Height:        Start Time: 1021 hrs. End Time: 1026 hrs.  Imaging Guidance (Spinal):          Type of Imaging Technique: Fluoroscopy Guidance (Spinal) Indication(s): Assistance in needle guidance and placement for procedures requiring needle placement in or near specific anatomical locations not easily accessible without such assistance. Exposure Time: Please see nurses notes. Contrast: None used. Fluoroscopic Guidance: I was personally present during the use of fluoroscopy. "Tunnel Vision Technique" used to obtain the best possible view of the target area. Parallax error corrected before commencing the procedure. "Direction-depth-direction" technique used to introduce the needle under continuous pulsed fluoroscopy. Once target was reached, antero-posterior, oblique, and lateral fluoroscopic projection used confirm needle placement in all planes. Images permanently stored in EMR. Interpretation: No contrast injected. I personally interpreted the  imaging intraoperatively. Adequate needle placement confirmed in multiple planes. Permanent images saved into the patient's record.  Antibiotic Prophylaxis:   Anti-infectives (From admission, onward)   None     Indication(s): None identified  Post-operative Assessment:  Post-procedure Vital Signs:  Pulse/HCG Rate: 70(!) 55 Temp: 98.2 F (36.8 C) Resp: 12 BP: (!) 143/71 SpO2: 100 %  EBL: None  Complications: No immediate post-treatment complications observed by team, or reported by patient.  Note: The patient tolerated the entire procedure well. A repeat set of vitals were taken after the procedure and the patient was kept under observation following institutional policy, for this type of procedure. Post-procedural neurological assessment was performed, showing return to baseline, prior to discharge. The patient was provided with post-procedure discharge instructions, including a section on how to identify potential problems. Should any problems arise concerning this procedure, the patient was given instructions to immediately contact us, at any time, without hesitation. In any case, we plan to contact the patient by telephone for a follow-up status report regarding this interventional procedure.  Comments:  No additional relevant information.  Plan of Care   Imaging Orders     DG C-Arm 1-60 Min-No Report  Procedure Orders     LUMBAR FACET(MEDIAL BRANCH NERVE BLOCK) MBNB  Medications ordered for procedure: Meds ordered this encounter  Medications  . lidocaine (XYLOCAINE) 2 % (with pres) injection 400 mg  . ropivacaine (PF) 2 mg/mL (0.2%) (NAROPIN) injection 9 mL  . triamcinolone acetonide (KENALOG-40) injection 40 mg   Medications administered: We administered lidocaine, ropivacaine (PF) 2 mg/mL (0.2%), and triamcinolone acetonide.  See the medical record for exact dosing, route, and time of administration.  New Prescriptions   No medications on file   Disposition:  Discharge home  Discharge Date & Time: 08/11/2017; 1036 hrs.   Physician-requested Follow-up: Return for post-procedure eval (2 wks), w/ Dr. Dossie Arbour.  Future Appointments  Date Time Provider [place]  08/24/2017  1:15 PM Milinda Pointer, MD [facility] None  08/31/2017  1:30 PM [name], MD [facility]  09/01/2017  2:00 PM Hollice Espy, MD [facility] None   Primary Care Physician: [name], MD Location: [facility] Note by: Gaspar Cola, MD Date: 08/11/2017; Time: 11:03 AM  Disclaimer:  Medicine is not an Chief Strategy Officer. The only guarantee in medicine is that nothing is guaranteed. It is important to note that the decision to proceed with this intervention was based on the information collected from the patient. The Data and conclusions were drawn from the patient's questionnaire, the interview, and the physical examination. Because the information was provided in large part by the patient, it cannot be guaranteed that it has not been purposely or unconsciously manipulated. Every effort has been made to obtain as much relevant data as possible for this evaluation. It is important to note that the conclusions that lead to this procedure are derived in large part from the available data. Always take into account that the treatment will also be dependent on availability of resources and existing treatment guidelines, considered by other Pain Management Practitioners as being common knowledge and practice, at the time of the intervention. For Medico-Legal purposes, it is also important to point out that variation in procedural techniques and pharmacological choices are the acceptable norm. The indications, contraindications, technique, and results of the above procedure should only be interpreted and judged by a Board-Certified Interventional Pain Specialist with extensive familiarity and expertise in the same exact procedure and technique.

## 2017-08-11 NOTE — Progress Notes (Signed)
Safety precautions to be maintained throughout the outpatient stay will include: orient to surroundings, keep bed in low position, maintain call bell within reach at all times, provide assistance with transfer out of bed and ambulation.  

## 2017-08-11 NOTE — Patient Instructions (Signed)

## 2017-08-12 ENCOUNTER — Encounter: Payer: Self-pay | Admitting: Internal Medicine

## 2017-08-13 DIAGNOSIS — J301 Allergic rhinitis due to pollen: Secondary | ICD-10-CM | POA: Diagnosis not present

## 2017-08-13 DIAGNOSIS — J3089 Other allergic rhinitis: Secondary | ICD-10-CM | POA: Diagnosis not present

## 2017-08-14 ENCOUNTER — Other Ambulatory Visit: Payer: Self-pay | Admitting: Internal Medicine

## 2017-08-14 NOTE — Telephone Encounter (Signed)
Request sent to provider.

## 2017-08-14 NOTE — Telephone Encounter (Signed)
Last OV 06/25/17 Next OV 08/31/17 Last refill 06/11/17

## 2017-08-16 MED ORDER — ALPRAZOLAM 0.25 MG PO TABS: ORAL_TABLET | ORAL | 1 refills | Status: DC

## 2017-08-16 NOTE — Telephone Encounter (Signed)
I have ok'd the xanax #30 with one refill.

## 2017-08-18 DIAGNOSIS — D2261 Melanocytic nevi of right upper limb, including shoulder: Secondary | ICD-10-CM | POA: Diagnosis not present

## 2017-08-18 DIAGNOSIS — D2262 Melanocytic nevi of left upper limb, including shoulder: Secondary | ICD-10-CM | POA: Diagnosis not present

## 2017-08-18 DIAGNOSIS — J3089 Other allergic rhinitis: Secondary | ICD-10-CM | POA: Diagnosis not present

## 2017-08-18 DIAGNOSIS — J301 Allergic rhinitis due to pollen: Secondary | ICD-10-CM | POA: Diagnosis not present

## 2017-08-18 DIAGNOSIS — D225 Melanocytic nevi of trunk: Secondary | ICD-10-CM | POA: Diagnosis not present

## 2017-08-23 NOTE — Progress Notes (Signed)
Patient's Name: Shannon [name]  MRN: [id number]  Referring Provider: [name], MD  DOB: 1965/07/10  PCP: [name], MD  DOS: 08/24/2017  Note by: Gaspar Cola, MD  Service setting: Ambulatory outpatient  Specialty: Interventional Pain Management  Location: [facility] (AMB) Pain Management Facility    Patient type: Established   Primary Reason(s) for Visit: Encounter for post-procedure evaluation of chronic illness with mild to moderate exacerbation CC: Pain (denies any pain today)  HPI  Shannon [name] is a 52 y.o. year old, female patient, who comes today for a post-procedure evaluation. She has Hyperthyroidism; GERD (gastroesophageal reflux disease); Anemia; Migraine; Menopausal symptoms; Health care maintenance; Anxiety; Gastroesophageal reflux disease without esophagitis; Chronic low back pain (Primary Area of Pain) (Bilateral) (R>L); Lumbar lateral recess stenosis (L4-5) (Left); Lumbar facet hypertrophy; Chronic lumbar radicular pain [facility] source of pain) (intermittent) (L4 Dermatome) (Left); Chronic hip pain (resolved after hip replacement) (Left); Chronic sacroiliac joint pain (Secondary source of pain) (intermittent) (Left); Nicotine dependence; Tobacco abuse; Lumbar spondylosis; Encounter for chronic pain management; Post-menopausal bleeding; Axillary lump; Osteoarthritis of sacroiliac joint (Bilateral) (R>L); History of hip replacement (Left); Osteoarthritis of hip (Left); Chronic sacroiliac joint pain (Right); Chronic myofascial pain syndrome (Right Gluteous Muscle); Chronic pain syndrome; Other specified dorsopathies, sacral and sacrococcygeal region; Urinary urgency; Lumbar Facet syndrome (Right); and Spondylosis without myelopathy or radiculopathy, lumbosacral region on their problem list. Her primarily concern today is the Pain (denies any pain today)  Pain Assessment: Location: Right Back Radiating: denies Onset: Other (comment)(no pain today) Duration: Chronic  pain Quality: (no pain today) Severity: 0-No pain/10 (subjective, self-reported pain score)  Note: Reported level is compatible with observation.                               Timing: Rarely Modifying factors: procedures BP: 112/75  HR: 70  Shannon [name] comes in today for post-procedure evaluation after the treatment done on 08/11/2017.  Further details on both, my assessment(s), as well as the proposed treatment plan, please see below.  Post-Procedure Assessment  08/11/2017 Procedure: Diagnostic right-sided lumbar facet block #1 under fluoroscopic guidance, no sedation Pre-procedure pain score:  2/10 Post-procedure pain score: 0/10 (100% relief) Influential Factors: BMI: 22.68 kg/m Intra-procedural challenges: None observed.         Assessment challenges: None detected.              Reported side-effects: None.        Post-procedural adverse reactions or complications: None reported         Sedation: No sedation used. When no sedatives are used, the analgesic levels obtained are directly associated to the effectiveness of the local anesthetics. However, when sedation is provided, the level of analgesia obtained during the initial 1 hour following the intervention, is believed to be the result of a combination of factors. These factors may include, but are not limited to: 1. The effectiveness of the local anesthetics used. 2. The effects of the analgesic(s) and/or anxiolytic(s) used. 3. The degree of discomfort experienced by the patient at the time of the procedure. 4. The patients ability and reliability in recalling and recording the events. 5. The presence and influence of possible secondary gains and/or psychosocial factors. Reported result: Relief experienced during the 1st hour after the procedure: 100 % (Ultra-Short Term Relief)            Interpretative annotation: Clinically appropriate result. No IV Analgesic or Anxiolytic given, therefore  benefits are completely due to Local  Anesthetic effects.          Effects of local anesthetic: The analgesic effects attained during this period are directly associated to the localized infiltration of local anesthetics and therefore cary significant diagnostic value as to the etiological location, or anatomical origin, of the pain. Expected duration of relief is directly dependent on the pharmacodynamics of the local anesthetic used. Long-acting (4-6 hours) anesthetics used.  Reported result: Relief during the next 4 to 6 hour after the procedure: 100 % (Short-Term Relief)            Interpretative annotation: Clinically appropriate result. Analgesia during this period is likely to be Local Anesthetic-related.          Long-term benefit: Defined as the period of time past the expected duration of local anesthetics (1 hour for short-acting and 4-6 hours for long-acting). With the possible exception of prolonged sympathetic blockade from the local anesthetics, benefits during this period are typically attributed to, or associated with, other factors such as analgesic sensory neuropraxia, antiinflammatory effects, or beneficial biochemical changes provided by agents other than the local anesthetics.  Reported result: Extended relief following procedure: 100 % (Long-Term Relief)            Interpretative annotation: Clinically appropriate result. Good relief. No permanent benefit expected. Inflammation plays a part in the etiology to the pain. Benefit believed to be steroid-related.  Current benefits: Defined as reported results that persistent at this point in time.   Analgesia: 100 % Shannon [name] reports improvement of axial symptoms. Function: Shannon [name] reports improvement in function ROM: Shannon [name] reports improvement in ROM Interpretative annotation: Ongoing benefit. Therapeutic benefit observed. Effective therapeutic approach. Benefit could be steroid-related.  Interpretation: Results would suggest a successful diagnostic intervention.                   Plan:  Set up procedure as a PRN palliative treatment option for this patient.                Laboratory Chemistry  Inflammation Markers (CRP: Acute Phase) (ESR: Chronic Phase) Lab Results  Component Value Date   ESRSEDRATE 8 05/22/2015                         Renal Markers Lab Results  Component Value Date   BUN 14 02/26/2017   CREATININE 0.68 02/26/2017   GFRAA >60 05/02/2014   GFRNONAA >60 05/02/2014                             Hepatic Markers Lab Results  Component Value Date   AST 20 02/26/2017   ALT 20 02/26/2017   ALBUMIN 4.3 02/26/2017                        Neuropathy Markers No results found for: HGBA1C, HIV                      Hematology Parameters Lab Results  Component Value Date   PLT 241.0 02/26/2017   HGB 14.4 02/26/2017   HCT 42.5 02/26/2017                        CV Markers Lab Results  Component Value Date   CKTOTAL 65 05/22/2015   TROPONINI <0.03 05/02/2014  Note: Lab results reviewed.  Recent Diagnostic Imaging Results  DG C-Arm 1-60 Min-No Report Fluoroscopy was utilized by the requesting physician.  No radiographic  interpretation.   Complexity Note: I personally reviewed the fluoroscopic imaging of the procedure.                        Meds   Current Outpatient Medications:  .  acetaminophen (TYLENOL) 500 MG tablet, Take 1,000 mg by mouth every 6 (six) hours as needed for moderate pain., Disp: , Rfl:  .  ALPRAZolam (XANAX) 0.25 MG tablet, TAKE ONE TABLET AT BEDTIME AS NEEDED FORANXIETY, Disp: 30 tablet, Rfl: 1 .  nystatin cream (MYCOSTATIN), Apply 1 application topically 2 (two) times daily., Disp: 30 g, Rfl: 0 .  omeprazole (PRILOSEC) 40 MG capsule, Take 40 mg by mouth daily., Disp: , Rfl:  .  ranitidine (ZANTAC) 150 MG tablet, Take 150 mg by mouth as needed. , Disp: , Rfl:   ROS  Constitutional: Denies any fever or chills Gastrointestinal: No reported hemesis, hematochezia, vomiting,  or acute GI distress Musculoskeletal: Denies any acute onset joint swelling, redness, loss of ROM, or weakness Neurological: No reported episodes of acute onset apraxia, aphasia, dysarthria, agnosia, amnesia, paralysis, loss of coordination, or loss of consciousness  Allergies  Shannon [name] is allergic to codeine; decongest-aid [pseudoephedrine]; flagyl [metronidazole]; and sulfa antibiotics.  [place]  Drug: Shannon [name]  reports that she does not use drugs. Alcohol:  reports that she does not drink alcohol. Tobacco:  reports that she quit smoking about 17 years ago. She has never used smokeless tobacco. Medical:  has a past medical history of Anemia, Anxiety, Breast pain, right (11/07/2012), Breathing difficulty (06/20/2014), Environmental allergies, GERD (gastroesophageal reflux disease), Hiatal hernia, History of migraine headaches, Hyperthyroidism, and Vaginitis (01/16/2013). Surgical: Shannon [name]  has a past surgical history that includes Septoplasty (5/90); Nasal sinus surgery (11/93); Cesarean section (11/98); Tubal ligation (02/1998); Umbilical hernia repair (02/1998); Cholecystectomy (11/05); Joint replacement (Left, 2017); and Breast biopsy (Right, 12/05/2015). Family: family history includes Breast cancer in her unknown relative; Breast cancer (age of onset: 51) in her mother; Heart disease in her father and maternal grandfather; Hypercholesterolemia in her father and mother; Hypertension in her father; Kidney cancer in her father; Leukemia in her father; Lung cancer in her paternal grandfather; Pancreatic cancer in her paternal grandfather; Prostate cancer in her paternal grandfather; Thyroid disease in her father and mother.  Constitutional Exam  General appearance: Well nourished, well developed, and well hydrated. In no apparent acute distress Vitals:   08/24/17 1306  BP: 112/75  Pulse: 70  Temp: 98.5 F (36.9 C)  TempSrc: Oral  Weight: 124 lb (56.2 kg)  Height: '5\' 2"'  (1.575 m)   BMI  Assessment: Estimated body mass index is 22.68 kg/m as calculated from the following:   Height as of this encounter: '5\' 2"'  (1.575 m).   Weight as of this encounter: 124 lb (56.2 kg).  BMI interpretation table: BMI level Category Range association with higher incidence of chronic pain  <18 kg/m2 Underweight   18.5-24.9 kg/m2 Ideal body weight   25-29.9 kg/m2 Overweight Increased incidence by 20%  30-34.9 kg/m2 Obese (Class I) Increased incidence by 68%  35-39.9 kg/m2 Severe obesity (Class II) Increased incidence by 136%  >40 kg/m2 Extreme obesity (Class III) Increased incidence by 254%   Patient's current BMI Ideal Body weight  Body mass index is 22.68 kg/m. Ideal body weight: 50.1 kg (110 lb 7.2 oz)  Adjusted ideal body weight: 52.6 kg (115 lb 13.9 oz)   BMI Readings from Last 4 Encounters:  08/24/17 22.68 kg/m  08/11/17 22.86 kg/m  07/21/17 23.05 kg/m  07/20/17 23.05 kg/m   Wt Readings from Last 4 Encounters:  08/24/17 124 lb (56.2 kg)  08/11/17 125 lb (56.7 kg)  07/21/17 126 lb (57.2 kg)  07/20/17 126 lb (57.2 kg)  Psych/Mental status: Alert, oriented x 3 (person, place, & time)       Eyes: PERLA Respiratory: No evidence of acute respiratory distress  Cervical Spine Area Exam  Skin & Axial Inspection: No masses, redness, edema, swelling, or associated skin lesions Alignment: Symmetrical Functional ROM: Unrestricted ROM      Stability: No instability detected Muscle Tone/Strength: Functionally intact. No obvious neuro-muscular anomalies detected. Sensory (Neurological): Unimpaired Palpation: No palpable anomalies              Upper Extremity (UE) Exam    Side: Right upper extremity  Side: Left upper extremity  Skin & Extremity Inspection: Skin color, temperature, and hair growth are WNL. No peripheral edema or cyanosis. No masses, redness, swelling, asymmetry, or associated skin lesions. No contractures.  Skin & Extremity Inspection: Skin color, temperature, and hair  growth are WNL. No peripheral edema or cyanosis. No masses, redness, swelling, asymmetry, or associated skin lesions. No contractures.  Functional ROM: Unrestricted ROM          Functional ROM: Unrestricted ROM          Muscle Tone/Strength: Functionally intact. No obvious neuro-muscular anomalies detected.  Muscle Tone/Strength: Functionally intact. No obvious neuro-muscular anomalies detected.  Sensory (Neurological): Unimpaired          Sensory (Neurological): Unimpaired          Palpation: No palpable anomalies              Palpation: No palpable anomalies              Provocative Test(s):  Phalen's test: deferred Tinel's test: deferred Apley's scratch test (touch opposite shoulder):  Action 1 (Across chest): deferred Action 2 (Overhead): deferred Action 3 (LB reach): deferred   Provocative Test(s):  Phalen's test: deferred Tinel's test: deferred Apley's scratch test (touch opposite shoulder):  Action 1 (Across chest): deferred Action 2 (Overhead): deferred Action 3 (LB reach): deferred    Thoracic Spine Area Exam  Skin & Axial Inspection: No masses, redness, or swelling Alignment: Symmetrical Functional ROM: Unrestricted ROM Stability: No instability detected Muscle Tone/Strength: Functionally intact. No obvious neuro-muscular anomalies detected. Sensory (Neurological): Unimpaired Muscle strength & Tone: No palpable anomalies  Lumbar Spine Area Exam  Skin & Axial Inspection: No masses, redness, or swelling Alignment: Symmetrical Functional ROM: Improved after treatment       Stability: No instability detected Muscle Tone/Strength: Functionally intact. No obvious neuro-muscular anomalies detected. Sensory (Neurological): Unimpaired Palpation: No palpable anomalies       Provocative Tests: Lumbar Hyperextension/rotation test: Improved after treatment       Lumbar quadrant test (Kemp's test): Improved after treatment       Lumbar Lateral bending test: deferred today        Patrick's Maneuver: deferred today                   FABER test: deferred today                   Thigh-thrust test: deferred today       S-I compression test: deferred today  S-I distraction test: deferred today        Gait & Posture Assessment  Ambulation: Unassisted Gait: Relatively normal for age and body habitus Posture: WNL   Lower Extremity Exam    Side: Right lower extremity  Side: Left lower extremity  Stability: No instability observed          Stability: No instability observed          Skin & Extremity Inspection: Skin color, temperature, and hair growth are WNL. No peripheral edema or cyanosis. No masses, redness, swelling, asymmetry, or associated skin lesions. No contractures.  Skin & Extremity Inspection: Skin color, temperature, and hair growth are WNL. No peripheral edema or cyanosis. No masses, redness, swelling, asymmetry, or associated skin lesions. No contractures.  Functional ROM: Unrestricted ROM                  Functional ROM: Unrestricted ROM                  Muscle Tone/Strength: Functionally intact. No obvious neuro-muscular anomalies detected.  Muscle Tone/Strength: Functionally intact. No obvious neuro-muscular anomalies detected.  Sensory (Neurological): Unimpaired  Sensory (Neurological): Unimpaired  Palpation: No palpable anomalies  Palpation: No palpable anomalies   Assessment  Primary Diagnosis & Pertinent Problem List: The primary encounter diagnosis was Chronic low back pain (Primary Area of Pain) (Bilateral) (R>L). Diagnoses of Lumbar Facet syndrome (Right) and Lumbar facet hypertrophy were also pertinent to this visit.  Status Diagnosis  Resolved Resolved Stable 1. Chronic low back pain (Primary Area of Pain) (Bilateral) (R>L)   2. Lumbar Facet syndrome (Right)   3. Lumbar facet hypertrophy     Problems updated and reviewed during this visit: No problems updated. Plan of Care  Pharmacotherapy (Medications Ordered): No orders of the  defined types were placed in this encounter.  Medications administered today: Shannon [name] had no medications administered during this visit.  Procedure Orders    No procedure(s) ordered today   Lab Orders  No laboratory test(s) ordered today   Imaging Orders  No imaging studies ordered today   Referral Orders  No referral(s) requested today    Interventional management options: Planned, scheduled, and/or pending:   None at this time    Considering:   Diagnostic right lumbar facet block #2  Possible right-sided lumbar facet RFA #1  Diagnostic right-sided sacroiliac joint block#4  Possible right-sided sacroiliac joint RFA #1  Palliative right-sided L4-5 LESI#2  Palliative left-sided L4-5 LESI #2  Palliative left-sided sacroiliac joint block#2  Possible left-sided sacroiliac joint RFA #1    Palliative PRN treatment(s):   Palliativeright gluteal MNB Diagnostic right-sided sacroiliac joint block Palliative right-sided L4-5 lumbar epidural steroid injection Palliative left-sided sacroiliac joint block Palliative right-sided lumbar facet blocks    Provider-requested follow-up: Return for PRN Procedure.  Future Appointments  Date Time Provider [place]  08/31/2017  1:30 PM [name], MD [id number]  09/01/2017  2:00 PM Hollice Espy, MD [facility] None   Primary Care Physician: [name], MD Location: [facility] Management Facility Note by: Gaspar Cola, MD Date: 08/24/2017; Time: 2:01 PM

## 2017-08-24 ENCOUNTER — Encounter: Payer: Self-pay | Admitting: Pain Medicine

## 2017-08-24 ENCOUNTER — Ambulatory Visit: Payer: 59 | Attending: Pain Medicine | Admitting: Pain Medicine

## 2017-08-24 ENCOUNTER — Other Ambulatory Visit: Payer: Self-pay

## 2017-08-24 VITALS — BP 112/75 | HR 70 | Temp 98.5°F | Ht 62.0 in | Wt 124.0 lb

## 2017-08-24 DIAGNOSIS — G8929 Other chronic pain: Secondary | ICD-10-CM

## 2017-08-24 DIAGNOSIS — Z0000 Encounter for general adult medical examination without abnormal findings: Secondary | ICD-10-CM | POA: Diagnosis not present

## 2017-08-24 DIAGNOSIS — M47816 Spondylosis without myelopathy or radiculopathy, lumbar region: Secondary | ICD-10-CM | POA: Insufficient documentation

## 2017-08-24 DIAGNOSIS — M1610 Unilateral primary osteoarthritis, unspecified hip: Secondary | ICD-10-CM | POA: Insufficient documentation

## 2017-08-24 DIAGNOSIS — K219 Gastro-esophageal reflux disease without esophagitis: Secondary | ICD-10-CM | POA: Insufficient documentation

## 2017-08-24 DIAGNOSIS — M545 Low back pain, unspecified: Secondary | ICD-10-CM

## 2017-08-24 DIAGNOSIS — Z8051 Family history of malignant neoplasm of kidney: Secondary | ICD-10-CM | POA: Insufficient documentation

## 2017-08-24 DIAGNOSIS — D649 Anemia, unspecified: Secondary | ICD-10-CM | POA: Insufficient documentation

## 2017-08-24 DIAGNOSIS — N950 Postmenopausal bleeding: Secondary | ICD-10-CM | POA: Diagnosis not present

## 2017-08-24 DIAGNOSIS — M48061 Spinal stenosis, lumbar region without neurogenic claudication: Secondary | ICD-10-CM | POA: Diagnosis not present

## 2017-08-24 DIAGNOSIS — Z881 Allergy status to other antibiotic agents status: Secondary | ICD-10-CM | POA: Insufficient documentation

## 2017-08-24 DIAGNOSIS — Z801 Family history of malignant neoplasm of trachea, bronchus and lung: Secondary | ICD-10-CM | POA: Diagnosis not present

## 2017-08-24 DIAGNOSIS — G43909 Migraine, unspecified, not intractable, without status migrainosus: Secondary | ICD-10-CM | POA: Diagnosis not present

## 2017-08-24 DIAGNOSIS — E0590 Thyrotoxicosis, unspecified without thyrotoxic crisis or storm: Secondary | ICD-10-CM | POA: Diagnosis not present

## 2017-08-24 DIAGNOSIS — Z79899 Other long term (current) drug therapy: Secondary | ICD-10-CM | POA: Insufficient documentation

## 2017-08-24 DIAGNOSIS — M7918 Myalgia, other site: Secondary | ICD-10-CM | POA: Diagnosis not present

## 2017-08-24 DIAGNOSIS — G894 Chronic pain syndrome: Secondary | ICD-10-CM | POA: Diagnosis not present

## 2017-08-24 DIAGNOSIS — Z7983 Long term (current) use of bisphosphonates: Secondary | ICD-10-CM | POA: Diagnosis not present

## 2017-08-24 DIAGNOSIS — Z882 Allergy status to sulfonamides status: Secondary | ICD-10-CM | POA: Diagnosis not present

## 2017-08-24 DIAGNOSIS — Z780 Asymptomatic menopausal state: Secondary | ICD-10-CM | POA: Insufficient documentation

## 2017-08-24 DIAGNOSIS — Z96642 Presence of left artificial hip joint: Secondary | ICD-10-CM | POA: Diagnosis not present

## 2017-08-24 DIAGNOSIS — Z8249 Family history of ischemic heart disease and other diseases of the circulatory system: Secondary | ICD-10-CM | POA: Insufficient documentation

## 2017-08-24 DIAGNOSIS — Z885 Allergy status to narcotic agent status: Secondary | ICD-10-CM | POA: Insufficient documentation

## 2017-08-24 DIAGNOSIS — Z87891 Personal history of nicotine dependence: Secondary | ICD-10-CM | POA: Insufficient documentation

## 2017-08-24 DIAGNOSIS — Z806 Family history of leukemia: Secondary | ICD-10-CM | POA: Diagnosis not present

## 2017-08-24 DIAGNOSIS — Z8042 Family history of malignant neoplasm of prostate: Secondary | ICD-10-CM | POA: Insufficient documentation

## 2017-08-24 DIAGNOSIS — Z8349 Family history of other endocrine, nutritional and metabolic diseases: Secondary | ICD-10-CM | POA: Insufficient documentation

## 2017-08-24 DIAGNOSIS — Z803 Family history of malignant neoplasm of breast: Secondary | ICD-10-CM | POA: Insufficient documentation

## 2017-08-24 DIAGNOSIS — F419 Anxiety disorder, unspecified: Secondary | ICD-10-CM | POA: Diagnosis not present

## 2017-08-24 NOTE — Progress Notes (Signed)
Safety precautions to be maintained throughout the outpatient stay will include: orient to surroundings, keep bed in low position, maintain call bell within reach at all times, provide assistance with transfer out of bed and ambulation.  

## 2017-08-24 NOTE — Patient Instructions (Signed)
____________________________________________________________________________________________  Preparing for your procedure (without sedation)  Instructions: . Oral Intake: Do not eat or drink anything for at least 3 hours prior to your procedure. . Transportation: Unless otherwise stated by your physician, you may drive yourself after the procedure. . Blood Pressure Medicine: Take your blood pressure medicine with a sip of water the morning of the procedure. . Blood thinners: Notify our staff if you are taking any blood thinners. Depending on which one you take, there will be specific instructions on how and when to stop it. . Diabetics on insulin: Notify the staff so that you can be scheduled 1st case in the morning. If your diabetes requires high dose insulin, take only  of your normal insulin dose the morning of the procedure and notify the staff that you have done so. . Preventing infections: Shower with an antibacterial soap the morning of your procedure.  . Build-up your immune system: Take 1000 mg of Vitamin C with every meal (3 times a day) the day prior to your procedure. Marland Kitchen Antibiotics: Inform the staff if you have a condition or reason that requires you to take antibiotics before dental procedures. . Pregnancy: If you are pregnant, call and cancel the procedure. . Sickness: If you have a cold, fever, or any active infections, call and cancel the procedure. . Arrival: You must be in the facility at least 30 minutes prior to your scheduled procedure. . Children: Do not bring any children with you. . Dress appropriately: Bring dark clothing that you would not mind if they get stained. . Valuables: Do not bring any jewelry or valuables.  Procedure appointments are reserved for interventional treatments only. Marland Kitchen No Prescription Refills. . No medication changes will be discussed during procedure appointments. . No disability issues will be discussed.  Reasons to call and reschedule or  cancel your procedure: (Following these recommendations will minimize the risk of a serious complication.) . Surgeries: Avoid having procedures within 2 weeks of any surgery. (Avoid for 2 weeks before or after any surgery). . Flu Shots: Avoid having procedures within 2 weeks of a flu shots or . (Avoid for 2 weeks before or after immunizations). . Barium: Avoid having a procedure within 7-10 days after having had a radiological study involving the use of radiological contrast. (Myelograms, Barium swallow or enema study). . Heart attacks: Avoid any elective procedures or surgeries for the initial 6 months after a "Myocardial Infarction" (Heart Attack). . Blood thinners: It is imperative that you stop these medications before procedures. Let us know if you if you take any blood thinner.  . Infection: Avoid procedures during or within two weeks of an infection (including chest colds or gastrointestinal problems). Symptoms associated with infections include: Localized redness, fever, chills, night sweats or profuse sweating, burning sensation when voiding, cough, congestion, stuffiness, runny nose, sore throat, diarrhea, nausea, vomiting, cold or Flu symptoms, recent or current infections. It is specially important if the infection is over the area that we intend to treat. Marland Kitchen Heart and lung problems: Symptoms that may suggest an active cardiopulmonary problem include: cough, chest pain, breathing difficulties or shortness of breath, dizziness, ankle swelling, uncontrolled high or unusually low blood pressure, and/or palpitations. If you are experiencing any of these symptoms, cancel your procedure and contact your primary care physician for an evaluation.  Remember:  Regular Business hours are:  Monday to Thursday 8:00 AM to 4:00 PM  Provider's Schedule: Shannon Pointer, MD:  Procedure days: Tuesday and Thursday 7:30 AM to  4:00 PM  Shannon Lateef, MD:  Procedure days: Monday and Wednesday 7:30 AM to 4:00  PM ____________________________________________________________________________________________    

## 2017-08-25 DIAGNOSIS — J3089 Other allergic rhinitis: Secondary | ICD-10-CM | POA: Diagnosis not present

## 2017-08-25 DIAGNOSIS — J301 Allergic rhinitis due to pollen: Secondary | ICD-10-CM | POA: Diagnosis not present

## 2017-08-31 ENCOUNTER — Ambulatory Visit: Payer: 59 | Admitting: Internal Medicine

## 2017-08-31 ENCOUNTER — Encounter: Payer: Self-pay | Admitting: Internal Medicine

## 2017-08-31 VITALS — BP 108/68 | HR 79 | Temp 98.3°F | Resp 18 | Wt 125.8 lb

## 2017-08-31 DIAGNOSIS — K219 Gastro-esophageal reflux disease without esophagitis: Secondary | ICD-10-CM | POA: Diagnosis not present

## 2017-08-31 DIAGNOSIS — D649 Anemia, unspecified: Secondary | ICD-10-CM

## 2017-08-31 DIAGNOSIS — F419 Anxiety disorder, unspecified: Secondary | ICD-10-CM | POA: Diagnosis not present

## 2017-08-31 DIAGNOSIS — Z1239 Encounter for other screening for malignant neoplasm of breast: Secondary | ICD-10-CM

## 2017-08-31 DIAGNOSIS — M48061 Spinal stenosis, lumbar region without neurogenic claudication: Secondary | ICD-10-CM

## 2017-08-31 DIAGNOSIS — Z1231 Encounter for screening mammogram for malignant neoplasm of breast: Secondary | ICD-10-CM

## 2017-08-31 NOTE — Assessment & Plan Note (Signed)
S/p injections.  Back pain better.

## 2017-08-31 NOTE — Progress Notes (Signed)
Patient ID: Shannon [name], female   DOB: [birth year], 52 y.o.   MRN: [id number]   Subjective:    Patient ID: Shannon [name], female    DOB: 02-22-1965, 52 y.o.   MRN: [id number]  HPI  Patient here for a scheduled follow up.  She reports she is doing well.  Increased stress with her daughter returning to school.  Overall handling things well.  Does not feel needs anything more at this time. Stays active.  No chest pain.  No sob.  No acid reflux.  No abdominal pain.  Bowels moving.  Blood pressure doing well.  S/p injection - back.  Better.     Past Medical History:  Diagnosis Date  . Anemia   . Anxiety   . Breast pain, right 11/07/2012  . Breathing difficulty 06/20/2014  . Environmental allergies   . GERD (gastroesophageal reflux disease)   . Hiatal hernia    small  . History of migraine headaches   . Hyperthyroidism    s/p ablation  . Vaginitis 01/16/2013   Past Surgical History:  Procedure Laterality Date  . BREAST BIOPSY Right 12/05/2015   neg  . CESAREAN SECTION  11/98   Dr Roena Malady  . CHOLECYSTECTOMY  11/05   Dr Pat Patrick  . JOINT REPLACEMENT Left 2017   hip  . NASAL SINUS SURGERY  11/93  . SEPTOPLASTY  5/90   Dr Rossie Muskrat  . TUBAL LIGATION  02/1998  . UMBILICAL HERNIA REPAIR  02/1998   Family History  Problem Relation Age of Onset  . Heart disease Father   . Hypertension Father   . Thyroid disease Father   . Hypercholesterolemia Father   . Kidney cancer Father   . Leukemia Father        hairy cell  . Hypercholesterolemia Mother   . Thyroid disease Mother   . Breast cancer Mother 62  . Breast cancer Unknown        maternal great grandmother  . Heart disease Maternal Grandfather        MI - age 46  . Prostate cancer Paternal Grandfather   . Pancreatic cancer Paternal Grandfather   . Lung cancer Paternal Grandfather   . Bladder Cancer Neg Hx    Social History   Socioeconomic History  . Marital status: Married    Spouse name: Not on file  . Number of children:  1  . Years of education: Not on file  . Highest education level: Not on file  Occupational History  . Not on file  Social Needs  . Financial resource strain: Not on file  . Food insecurity:    Worry: Not on file    Inability: Not on file  . Transportation needs:    Medical: Not on file    Non-medical: Not on file  Tobacco Use  . Smoking status: Former Smoker    Last attempt to quit: 01/28/2000    Years since quitting: 17.6  . Smokeless tobacco: Never Used  Substance and Sexual Activity  . Alcohol use: No    Alcohol/week: 0.0 oz  . Drug use: No  . Sexual activity: Not on file  Lifestyle  . Physical activity:    Days per week: Not on file    Minutes per session: Not on file  . Stress: Not on file  Relationships  . Social connections:    Talks on phone: Not on file    Gets together: Not on file    Attends religious service:  Not on file    Active member of club or organization: Not on file    Attends meetings of clubs or organizations: Not on file    Relationship status: Not on file  Other Topics Concern  . Not on file  Social History Narrative  . Not on file    Outpatient Encounter Medications as of 08/31/2017  Medication Sig  . acetaminophen (TYLENOL) 500 MG tablet Take 1,000 mg by mouth every 6 (six) hours as needed for moderate pain.  Marland Kitchen ALPRAZolam (XANAX) 0.25 MG tablet TAKE ONE TABLET AT BEDTIME AS NEEDED FORANXIETY  . nystatin cream (MYCOSTATIN) Apply 1 application topically 2 (two) times daily.  Marland Kitchen omeprazole (PRILOSEC) 40 MG capsule Take 40 mg by mouth daily.  . ranitidine (ZANTAC) 150 MG tablet Take 150 mg by mouth as needed.    No facility-administered encounter medications on file as of 08/31/2017.     Review of Systems  Constitutional: Negative for appetite change and unexpected weight change.  HENT: Negative for congestion and sinus pressure.   Respiratory: Negative for cough, chest tightness and shortness of breath.   Cardiovascular: Negative for chest  pain, palpitations and leg swelling.  Gastrointestinal: Negative for abdominal pain, diarrhea, nausea and vomiting.  Genitourinary: Negative for difficulty urinating and dysuria.  Musculoskeletal: Negative for joint swelling and myalgias.       Back better.    Skin: Negative for color change and rash.  Neurological: Negative for dizziness, light-headedness and headaches.  Psychiatric/Behavioral: Negative for agitation and dysphoric mood.       Objective:     Blood pressure rechecked by me:  116/72  Physical Exam  Constitutional: She appears well-developed and well-nourished. No distress.  HENT:  Nose: Nose normal.  Mouth/Throat: Oropharynx is clear and moist.  Neck: Neck supple. No thyromegaly present.  Cardiovascular: Normal rate and regular rhythm.  Pulmonary/Chest: Breath sounds normal. No respiratory distress. She has no wheezes.  Abdominal: Soft. Bowel sounds are normal. There is no tenderness.  Musculoskeletal: She exhibits no edema or tenderness.  Lymphadenopathy:    She has no cervical adenopathy.  Skin: No rash noted. No erythema.  Psychiatric: She has a normal mood and affect. Her behavior is normal.    BP 108/68 (BP Location: Left Arm, Patient Position: Sitting, Cuff Size: Normal)   Pulse 79   Temp 98.3 F (36.8 C) (Oral)   Resp 18   Wt 125 lb 12.8 oz (57.1 kg)   LMP 02/12/2013   SpO2 97%   BMI 23.01 kg/m  Wt Readings from Last 3 Encounters:  08/31/17 125 lb 12.8 oz (57.1 kg)  08/24/17 124 lb (56.2 kg)  08/11/17 125 lb (56.7 kg)     Lab Results  Component Value Date   WBC 5.2 02/26/2017   HGB 14.4 02/26/2017   HCT 42.5 02/26/2017   PLT 241.0 02/26/2017   GLUCOSE 93 02/26/2017   CHOL 187 02/26/2017   TRIG 177.0 (H) 02/26/2017   HDL 64.00 02/26/2017   LDLCALC 88 02/26/2017   ALT 20 02/26/2017   AST 20 02/26/2017   NA 142 02/26/2017   K 4.3 02/26/2017   CL 104 02/26/2017   CREATININE 0.68 02/26/2017   BUN 14 02/26/2017   CO2 32 02/26/2017    TSH 2.37 02/26/2017    Dg C-arm 1-60 Min-no Report  Result Date: 08/11/2017 Fluoroscopy was utilized by the requesting physician.  No radiographic interpretation.       Assessment & Plan:   Problem List Items Addressed  This Visit    Anemia    Follow cbc.       Anxiety    Takes xanax.  Stable.  Follow.        GERD (gastroesophageal reflux disease)    Controlled on current regimen.  Controlled if takes medication regularly and watches her diet.  Follow.  Just saw GI.        Lumbar lateral recess stenosis (L4-5) (Left) (Chronic)    S/p injections.  Back pain better.         Other Visit Diagnoses    Breast cancer screening    -  Primary   Relevant Orders   MM 3D SCREEN BREAST BILATERAL       [name], MD

## 2017-08-31 NOTE — Assessment & Plan Note (Signed)
Follow cbc.  

## 2017-08-31 NOTE — Assessment & Plan Note (Signed)
Takes xanax.  Stable.  Follow.

## 2017-08-31 NOTE — Assessment & Plan Note (Signed)
Controlled on current regimen.  Controlled if takes medication regularly and watches her diet.  Follow.  Just saw GI.

## 2017-09-01 ENCOUNTER — Encounter: Payer: Self-pay | Admitting: Urology

## 2017-09-01 ENCOUNTER — Ambulatory Visit: Payer: 59 | Admitting: Urology

## 2017-09-01 VITALS — BP 122/81 | HR 75 | Ht 62.0 in | Wt 124.0 lb

## 2017-09-01 DIAGNOSIS — R300 Dysuria: Secondary | ICD-10-CM

## 2017-09-01 DIAGNOSIS — R319 Hematuria, unspecified: Secondary | ICD-10-CM | POA: Diagnosis not present

## 2017-09-01 LAB — URINALYSIS, COMPLETE
BILIRUBIN UA: NEGATIVE
GLUCOSE, UA: NEGATIVE
Ketones, UA: NEGATIVE
LEUKOCYTES UA: NEGATIVE
Nitrite, UA: NEGATIVE
PROTEIN UA: NEGATIVE
RBC, UA: NEGATIVE
SPEC GRAV UA: 1.01 (ref 1.005–1.030)
UUROB: 0.2 mg/dL (ref 0.2–1.0)
pH, UA: 5.5 (ref 5.0–7.5)

## 2017-09-01 LAB — MICROSCOPIC EXAMINATION: RBC, UA: NONE SEEN /hpf (ref 0–2)

## 2017-09-01 MED ORDER — CIPROFLOXACIN HCL 500 MG PO TABS
500.0000 mg | ORAL_TABLET | Freq: Once | ORAL | Status: AC
  Administered 2017-09-01: 500 mg via ORAL

## 2017-09-01 MED ORDER — LIDOCAINE HCL URETHRAL/MUCOSAL 2 % EX GEL
1.0000 "application " | Freq: Once | CUTANEOUS | Status: AC
  Administered 2017-09-01: 1 via URETHRAL

## 2017-09-01 NOTE — Progress Notes (Signed)
   09/01/17  CC:  Chief Complaint  Patient presents with  . Cysto    HPI: 52 year old female previously evaluated for irritative voiding symptoms including dysuria in the setting of negative urinalysis who presents today for cystoscopy for further evaluation of this.  Blood pressure 122/81, pulse 75, height 5\' 2"  (1.575 m), weight 124 lb (56.2 kg), last menstrual period 02/12/2013. NED. A&Ox3.   No respiratory distress   Abd soft, NT, ND Normal external genitalia with patent urethral meatus  Cystoscopy Procedure Note  Patient identification was confirmed, informed consent was obtained, and patient was prepped using Betadine solution.  Lidocaine jelly was administered per urethral meatus.    Preoperative abx where received prior to procedure.    Procedure: - Flexible cystoscope introduced, without any difficulty.   - Thorough search of the bladder revealed:    normal urethral meatus    normal urothelium    no stones    no ulcers     no tumors    no urethral polyps    no trabeculation  - Ureteral orifices were normal in position and appearance.  Post-Procedure: - Patient tolerated the procedure well  Assessment/ Plan:  1. Dysuria NED, normal cytoscopy Currently asymptomatic Advised to return for same day nurse visit if symptoms recur for UA/ UCx Urine cytology as precation - Urinalysis, Complete - ciprofloxacin (CIPRO) tablet 500 mg - lidocaine (XYLOCAINE) 2 % jelly 1 application   Hollice Espy, MD

## 2017-09-03 DIAGNOSIS — J3089 Other allergic rhinitis: Secondary | ICD-10-CM | POA: Diagnosis not present

## 2017-09-03 DIAGNOSIS — J301 Allergic rhinitis due to pollen: Secondary | ICD-10-CM | POA: Diagnosis not present

## 2017-09-07 ENCOUNTER — Encounter: Payer: Self-pay | Admitting: Family Medicine

## 2017-09-07 ENCOUNTER — Ambulatory Visit: Payer: Self-pay

## 2017-09-07 ENCOUNTER — Other Ambulatory Visit: Payer: Self-pay | Admitting: Urology

## 2017-09-07 ENCOUNTER — Ambulatory Visit: Payer: 59 | Admitting: Family Medicine

## 2017-09-07 VITALS — BP 126/82 | HR 69 | Temp 98.2°F | Ht 65.0 in | Wt 126.4 lb

## 2017-09-07 DIAGNOSIS — R21 Rash and other nonspecific skin eruption: Secondary | ICD-10-CM

## 2017-09-07 DIAGNOSIS — L03818 Cellulitis of other sites: Secondary | ICD-10-CM | POA: Diagnosis not present

## 2017-09-07 MED ORDER — PREDNISONE 10 MG PO TABS: 10.0000 mg | ORAL_TABLET | Freq: Every day | ORAL | 0 refills | Status: AC

## 2017-09-07 MED ORDER — CEPHALEXIN 500 MG PO CAPS: 500.0000 mg | ORAL_CAPSULE | Freq: Two times a day (BID) | ORAL | 0 refills | Status: AC

## 2017-09-07 NOTE — Telephone Encounter (Signed)
Pt. called to report she woke up yesterday, and noticed a pea-sized red area with itching, in the suprapubic area.  Stated during the day, the area of redness spread to "about the diameter of an apple",  with moderate swelling.  Stated there was a "white center, initially", and now it appears to have a "darker red center."  Stated she applied Benadryl spray, and an ice pack yesterday, as it was "itching and burning" a lot.  This morning, noted the swelling has decreased somewhat.  Reported the redness has decreased to an area about the size of a lemon, and slightly warm.   Denied noting a specific area of rash; "only redness."  Denied fever.  Denied feeling ill. Denied any skin breakdown at the site. Reported she does not know if this is related to an insect or spider bite.    Appt. Sched. This AM with PCP office.  Pt. Agrees with plan.    Reason for Disposition . [1] Red or very tender (to touch) area AND [2] started over 24 hours after the bite . Non-serious spider bite  Answer Assessment - Initial Assessment Questions 1. TYPE of SPIDER: "What type of spider was it?"  (e.g., name, unknown, or brief description)     Unknown  2. LOCATION: "Where is the bite located?"      Suprapubic area 3. PAIN: "Is there any pain?" If so, ask: "How bad is it?"  (Scale 1-10; or mild, moderate, severe)     Denied pain 4. SWELLING: "How big is the swelling?" (Inches, cm or compare to coins)      Mild swelling  5. ONSET: "When did the bite occur?" (Minutes or hours ago)      Sunday AM 6. TETANUS: "When was the last tetanus booster?"      Unsure;  thinks DTaP 2012 7. OTHER SYMPTOMS: "Do you have any other symptoms?"  (e.g., muscle cramps, abdominal pain, change in urine color)     Redness about the size of a lemon; some warmth; itching and burning; denies fever; denies any opening in the skin  Protocols used: SPIDER BITE - [facility]  Message from Lennox Solders sent at 09/07/2017 8:49 AM EDT    Summary: please advise   Pt was bitten by something in her groin area now has swelling/hotness and itching . Pt does no know what date she got bitten. Pt has been using benadryl spray . Pt has been getting a little relief

## 2017-09-07 NOTE — Patient Instructions (Signed)
Great to meet you!  Keflex 2x per day for 10 days to treat skin infection  Short course of keflex to calm body's inflammatory response  May also continue to use benadryl as needed  Monitor for worsening redness/development of fever

## 2017-09-07 NOTE — Telephone Encounter (Signed)
Patient triaged by [facility] nurse and scheduled to see Philis Nettle today.

## 2017-09-07 NOTE — Progress Notes (Signed)
Subjective:    Patient ID: Shannon [name], female    DOB: 03-27-1965, 52 y.o.   MRN: [id number]  HPI   Patient presents to clinic with area of redness and warmth in suprapubic region.  Patient wonders if it is either from an insect bite or ingrown hair.  Denies any fever or chills.  Patient does not shave hair in the pubic area.  Area of redness and warmth is right on top of her 52 year old C-section scar.   Patient Active Problem List   Diagnosis Date Noted  . Lumbar Facet syndrome (Right) 07/20/2017  . Spondylosis without myelopathy or radiculopathy, lumbosacral region 07/20/2017  . Urinary urgency 06/28/2017  . Chronic pain syndrome 06/15/2017  . Other specified dorsopathies, sacral and sacrococcygeal region 06/15/2017  . Chronic myofascial pain syndrome (Right Gluteous Muscle) 03/03/2016  . Chronic sacroiliac joint pain (Right) 01/14/2016  . History of hip replacement (Left) 11/12/2015  . Osteoarthritis of hip (Left) 11/12/2015  . Osteoarthritis of sacroiliac joint (Bilateral) (R>L) 07/12/2015  . Axillary lump 05/22/2015  . Post-menopausal bleeding 02/20/2015  . Chronic low back pain (Primary Area of Pain) (Bilateral) (R>L) 12/16/2014  . Lumbar lateral recess stenosis (L4-5) (Left) 12/16/2014  . Lumbar facet hypertrophy 12/16/2014  . Chronic lumbar radicular pain [facility] source of pain) (intermittent) (L4 Dermatome) (Left) 12/16/2014  . Chronic hip pain (resolved after hip replacement) (Left) 12/16/2014  . Chronic sacroiliac joint pain (Secondary source of pain) (intermittent) (Left) 12/16/2014  . Nicotine dependence 12/16/2014  . Tobacco abuse 12/16/2014  . Lumbar spondylosis 12/16/2014  . Encounter for chronic pain management 12/16/2014  . Gastroesophageal reflux disease without esophagitis 10/18/2014  . Anxiety 06/20/2014  . Health care maintenance 04/02/2014  . Menopausal symptoms 11/07/2012  . Migraine 05/21/2012  . Anemia 02/01/2012  . Hyperthyroidism 01/30/2012    . GERD (gastroesophageal reflux disease) 01/30/2012   Social History   Tobacco Use  . Smoking status: Former Smoker    Last attempt to quit: 01/28/2000    Years since quitting: 17.6  . Smokeless tobacco: Never Used  Substance Use Topics  . Alcohol use: No    Alcohol/week: 0.0 standard drinks   Review of Systems  Constitutional: Negative for chills, fatigue and fever.  HENT: Negative for congestion, ear pain, sinus pain and sore throat.   Eyes: Negative.   Respiratory: Negative for cough, shortness of breath and wheezing.   Cardiovascular: Negative for chest pain, palpitations and leg swelling.  Gastrointestinal: Negative for abdominal pain, diarrhea, nausea and vomiting.  Genitourinary: Negative for dysuria, frequency and urgency.  Musculoskeletal: Negative for arthralgias and myalgias.  Skin: Negative for color change, pallor.  Positive for redness and warmth in suprapubic region. Neurological: Negative for syncope, light-headedness and headaches.  Psychiatric/Behavioral: The patient is not nervous/anxious.    Objective:   Physical Exam Physical Exam  Constitutional: She is oriented to person, place, and time. She appears well-developed and well-nourished. No distress.  HENT:  Head: Normocephalic and atraumatic.  Cardiovascular: Normal rate, regular rhythm and normal heart sounds.  Pulmonary/Chest: Effort normal and breath sounds normal. No respiratory distress. She has no wheezes. She has no rales.  Abdominal: Soft. Bowel sounds are normal. There is no tenderness.  Neurological: She is alert and oriented to person, place, and time.  Gait normal  Skin: Skin is warm and dry. No pallor.  Suprapubic area along patient's C-section scar is red and warm.  No obvious ingrown hair or insect bite. Psychiatric: She has a normal mood and  affect. Her behavior is normal. Nursing note and vitals reviewed.  Vitals:   09/07/17 0953  BP: 126/82  Pulse: 69  Temp: 98.2 F (36.8 C)  SpO2:  98%      Assessment & Plan:    Cellulitis - Suspect red suprapubic area is cellulitis.  Will treat with Keflex twice a day for 10 days.  Patient encouraged to avoid scratching area.  Skin eruption -patient given short burst of prednisone to calm inflammatory response.  Patient also advised she can use Benadryl as needed.  Keep appt scheduled 02/2018 as scheduled. Return to clinic sooner if needed.

## 2017-09-09 ENCOUNTER — Encounter: Payer: Self-pay | Admitting: Internal Medicine

## 2017-09-09 DIAGNOSIS — B3731 Acute candidiasis of vulva and vagina: Secondary | ICD-10-CM

## 2017-09-09 DIAGNOSIS — B373 Candidiasis of vulva and vagina: Secondary | ICD-10-CM

## 2017-09-09 MED ORDER — FLUCONAZOLE 150 MG PO TABS: 150.0000 mg | ORAL_TABLET | Freq: Once | ORAL | 0 refills | Status: AC

## 2017-09-10 DIAGNOSIS — J301 Allergic rhinitis due to pollen: Secondary | ICD-10-CM | POA: Diagnosis not present

## 2017-09-10 DIAGNOSIS — J3089 Other allergic rhinitis: Secondary | ICD-10-CM | POA: Diagnosis not present

## 2017-09-15 DIAGNOSIS — J301 Allergic rhinitis due to pollen: Secondary | ICD-10-CM | POA: Diagnosis not present

## 2017-09-15 DIAGNOSIS — J3089 Other allergic rhinitis: Secondary | ICD-10-CM | POA: Diagnosis not present

## 2017-09-16 ENCOUNTER — Other Ambulatory Visit: Payer: Self-pay | Admitting: Internal Medicine

## 2017-09-22 DIAGNOSIS — J3089 Other allergic rhinitis: Secondary | ICD-10-CM | POA: Diagnosis not present

## 2017-09-22 DIAGNOSIS — J301 Allergic rhinitis due to pollen: Secondary | ICD-10-CM | POA: Diagnosis not present

## 2017-09-25 ENCOUNTER — Telehealth: Payer: Self-pay | Admitting: Pain Medicine

## 2017-09-25 NOTE — Telephone Encounter (Signed)
Would like to speak with [facility]

## 2017-09-25 NOTE — Telephone Encounter (Signed)
Spoke with patient and she is having pain in her big toe and has had injections and would like top see if Dr Dossie Arbour could do these for her.  I will speak to Dr Dossie Arbour on Monday.

## 2017-09-29 NOTE — Telephone Encounter (Signed)
Spoke with Dr Dossie Arbour and he states patient needs to come in for an evaluation. Patient notified.

## 2017-10-01 DIAGNOSIS — J301 Allergic rhinitis due to pollen: Secondary | ICD-10-CM | POA: Diagnosis not present

## 2017-10-01 DIAGNOSIS — J3089 Other allergic rhinitis: Secondary | ICD-10-CM | POA: Diagnosis not present

## 2017-10-04 NOTE — Progress Notes (Signed)
Patient's Name: Shannon [name]  MRN: [id number]  Referring Provider: [name], MD  DOB: [birth year]  PCP: Shannon Pheasant, MD  DOS: 10/05/2017  Note by: Shannon Cola, MD  Service setting: Ambulatory outpatient  Specialty: Interventional Pain Management  Location: [facility] (AMB) Pain Management Facility    Patient type: Established   Primary Reason(s) for Visit: Evaluation of chronic illnesses with exacerbation, or progression (Level of risk: moderate) CC: Toe Pain (right great)  HPI  [name] is a 52 y.o. year old, female patient, who comes today for a follow-up evaluation. She has Hyperthyroidism; GERD (gastroesophageal reflux disease); Anemia; Migraine; Menopausal symptoms; Health care maintenance; Anxiety; Gastroesophageal reflux disease without esophagitis; Chronic low back pain (Primary Area of Pain) (Bilateral) (R>L); Lumbar lateral recess stenosis (L4-5) (Left); Lumbar facet hypertrophy; Chronic lumbar radicular pain [facility] source of pain) (intermittent) (L4 Dermatome) (Left); Chronic hip pain (resolved after hip replacement) (Left); Chronic sacroiliac joint pain (Secondary source of pain) (intermittent) (Left); Nicotine dependence; Tobacco abuse; Lumbar spondylosis; Encounter for chronic pain management; Post-menopausal bleeding; Axillary lump; Osteoarthritis of sacroiliac joint (Bilateral) (R>L); History of hip replacement (Left); Osteoarthritis of hip (Left); Chronic sacroiliac joint pain (Right); Chronic myofascial pain syndrome (Right Gluteous Muscle); Chronic pain syndrome; Other specified dorsopathies, sacral and sacrococcygeal region; Urinary urgency; Lumbar Facet syndrome (Right); Spondylosis without myelopathy or radiculopathy, lumbosacral region; Chronic toe pain (Right); Great toe pain (Right); Disorder of skeletal system; Pharmacologic therapy; and Problems influencing health status on their problem list. [name] was last seen on 09/25/2017. Her primarily concern today is the  Toe Pain (right great)  Pain Assessment: Location: Right Toe (Comment which one)(right great) Radiating: denies Onset: More than a month ago Duration: Chronic pain Quality: Throbbing, Tender Severity: 2 /10 (subjective, self-reported pain score)  Note: Reported level is compatible with observation.                         When using our objective Pain Scale, levels between 6 and 10/10 are said to belong in an emergency room, as it progressively worsens from a 6/10, described as severely limiting, requiring emergency care not usually available at an outpatient pain management facility. At a 6/10 level, communication becomes difficult and requires great effort. Assistance to reach the emergency department may be required. Facial flushing and profuse sweating along with potentially dangerous increases in heart rate and blood pressure will be evident. Timing: Intermittent Modifying factors: elevation, ice,heat, asper cream BP: (!) 129/96  HR: 71  Physical exam today demonstrated some tenderness over the area of the great toe on the right side.  The patient does admit to a family history of gout.  She denies any prior trauma.  She does admit to occasional swelling, redness, and tenderness.  Since she does have problems with her SI joint and lumbar spine, I will be testing her for the possibility of ankylosing spondylitis.  Blood work has been reviewed today and we will be updating it.  I will be testing for uric acid.  She indicates having had an intra-articular injection done by the orthopedic surgeons which she described as very painful but it did provide her with good relief for over 6 months.  She has requested that we take over those injections for her.  We will go ahead and schedule her to come back and do the injection under fluoroscopic guidance.  Further details on both, my assessment(s), as well as the proposed treatment plan, please see below.  Laboratory Chemistry  Inflammation  Markers (CRP: Acute Phase) (ESR: Chronic Phase) Lab Results  Component Value Date   ESRSEDRATE 8 05/22/2015                         Rheumatology Markers No results found for: RF, ANA, LABURIC, URICUR, LYMEIGGIGMAB, LYMEABIGMQN, HLAB27                      Renal Function Markers Lab Results  Component Value Date   BUN 14 02/26/2017   CREATININE 0.68 02/26/2017   GFRAA >60 05/02/2014   GFRNONAA >60 05/02/2014                             Hepatic Function Markers Lab Results  Component Value Date   AST 20 02/26/2017   ALT 20 02/26/2017   ALBUMIN 4.3 02/26/2017   ALKPHOS 70 02/26/2017                        Electrolytes Lab Results  Component Value Date   NA 142 02/26/2017   K 4.3 02/26/2017   CL 104 02/26/2017   CALCIUM 10.0 02/26/2017                        Neuropathy Markers No results found for: VITAMINB12, FOLATE, HGBA1C, HIV                      CNS Tests No results found for: SDES, GRAMSTAIN, CULT, COLORCSF, APPEARCSF, RBCCOUNTCSF, WBCCSF, POLYSCSF, LYMPHSCSF, EOSCSF, PROTEINCSF, GLUCCSF, JCVIRUS, CSFOLI, IGGCSF, IGGALB, IGGIND                      Bone Pathology Markers No results found for: VD25OH, [id number], [id number], DT2671IW5, 25OHVITD1, 25OHVITD2, 25OHVITD3, TESTOFREE, TESTOSTERONE                       Coagulation Parameters Lab Results  Component Value Date   PLT 241.0 02/26/2017                        Cardiovascular Markers Lab Results  Component Value Date   CKTOTAL 65 05/22/2015   TROPONINI <0.03 05/02/2014   HGB 14.4 02/26/2017   HCT 42.5 02/26/2017                         CA Markers No results found for: CEA, CA125, LABCA2                      Note: Lab results reviewed.  Recent Diagnostic Imaging Review   Complexity Note: No results found under the [facility] electronic medical record.                         Meds   Current Outpatient Medications:  .  acetaminophen (TYLENOL) 500 MG tablet, Take 1,000 mg by mouth every 6  (six) hours as needed for moderate pain., Disp: , Rfl:  .  ALPRAZolam (XANAX) 0.25 MG tablet, TAKE ONE TABLET AT BEDTIME AS NEEDED FORANXIETY, Disp: 30 tablet, Rfl: 1 .  nystatin cream (MYCOSTATIN), Apply 1 application topically 2 (two) times daily., Disp: 30 g, Rfl: 0 .  omeprazole (PRILOSEC) 40 MG capsule, Take 40 mg by  mouth daily., Disp: , Rfl:  .  ranitidine (ZANTAC) 150 MG tablet, Take 150 mg by mouth as needed. , Disp: , Rfl:   ROS  Constitutional: Denies any fever or chills Gastrointestinal: No reported hemesis, hematochezia, vomiting, or acute GI distress Musculoskeletal: Denies any acute onset joint swelling, redness, loss of ROM, or weakness Neurological: No reported episodes of acute onset apraxia, aphasia, dysarthria, agnosia, amnesia, paralysis, loss of coordination, or loss of consciousness  Allergies  Ms. Wherley is allergic to codeine; decongest-aid [pseudoephedrine]; flagyl [metronidazole]; and sulfa antibiotics.  [place]  Drug: Ms. Barra  reports that she does not use drugs. Alcohol:  reports that she does not drink alcohol. Tobacco:  reports that she quit smoking about 17 years ago. She has never used smokeless tobacco. Medical:  has a past medical history of Anemia, Anxiety, Breast pain, right (11/07/2012), Breathing difficulty (06/20/2014), Environmental allergies, GERD (gastroesophageal reflux disease), Hiatal hernia, History of migraine headaches, Hyperthyroidism, and Vaginitis (01/16/2013). Surgical: Ms. Heidel  has a past surgical history that includes Septoplasty (5/90); Nasal sinus surgery (11/93); Cesarean section (11/98); Tubal ligation (02/1998); Umbilical hernia repair (02/1998); Cholecystectomy (11/05); Joint replacement (Left, 2017); and Breast biopsy (Right, 12/05/2015). Family: family history includes Breast cancer in her unknown relative; Breast cancer (age of onset: 46) in her mother; Heart disease in her father and maternal grandfather; Hypercholesterolemia in her  father and mother; Hypertension in her father; Kidney cancer in her father; Leukemia in her father; Lung cancer in her paternal grandfather; Pancreatic cancer in her paternal grandfather; Prostate cancer in her paternal grandfather; Thyroid disease in her father and mother.  Constitutional Exam  General appearance: Well nourished, well developed, and well hydrated. In no apparent acute distress Vitals:   10/05/17 1054  BP: (!) 129/96  Pulse: 71  Resp: 18  Temp: 98.2 F (36.8 C)  TempSrc: Oral  SpO2: 100%  Weight: 124 lb (56.2 kg)  Height: '5\' 2"'  (1.575 m)   BMI Assessment: Estimated body mass index is 22.68 kg/m as calculated from the following:   Height as of this encounter: '5\' 2"'  (1.575 m).   Weight as of this encounter: 124 lb (56.2 kg).  BMI interpretation table: BMI level Category Range association with higher incidence of chronic pain  <18 kg/m2 Underweight   18.5-24.9 kg/m2 Ideal body weight   25-29.9 kg/m2 Overweight Increased incidence by 20%  30-34.9 kg/m2 Obese (Class I) Increased incidence by 68%  35-39.9 kg/m2 Severe obesity (Class II) Increased incidence by 136%  >40 kg/m2 Extreme obesity (Class III) Increased incidence by 254%   Patient's current BMI Ideal Body weight  Body mass index is 22.68 kg/m. Ideal body weight: 50.1 kg (110 lb 7.2 oz) Adjusted ideal body weight: 52.6 kg (115 lb 13.9 oz)   BMI Readings from Last 4 Encounters:  10/05/17 22.68 kg/m  09/07/17 21.03 kg/m  09/01/17 22.68 kg/m  08/31/17 23.01 kg/m   Wt Readings from Last 4 Encounters:  10/05/17 124 lb (56.2 kg)  09/07/17 126 lb 6.4 oz (57.3 kg)  09/01/17 124 lb (56.2 kg)  08/31/17 125 lb 12.8 oz (57.1 kg)  Psych/Mental status: Alert, oriented x 3 (person, place, & time)       Eyes: PERLA Respiratory: No evidence of acute respiratory distress  Cervical Spine Area Exam  Skin & Axial Inspection: No masses, redness, edema, swelling, or associated skin lesions Alignment:  Symmetrical Functional ROM: Unrestricted ROM      Stability: No instability detected Muscle Tone/Strength: Functionally intact. No obvious neuro-muscular  anomalies detected. Sensory (Neurological): Unimpaired Palpation: No palpable anomalies              Upper Extremity (UE) Exam    Side: Right upper extremity  Side: Left upper extremity  Skin & Extremity Inspection: Skin color, temperature, and hair growth are WNL. No peripheral edema or cyanosis. No masses, redness, swelling, asymmetry, or associated skin lesions. No contractures.  Skin & Extremity Inspection: Skin color, temperature, and hair growth are WNL. No peripheral edema or cyanosis. No masses, redness, swelling, asymmetry, or associated skin lesions. No contractures.  Functional ROM: Unrestricted ROM          Functional ROM: Unrestricted ROM          Muscle Tone/Strength: Functionally intact. No obvious neuro-muscular anomalies detected.  Muscle Tone/Strength: Functionally intact. No obvious neuro-muscular anomalies detected.  Sensory (Neurological): Unimpaired          Sensory (Neurological): Unimpaired          Palpation: No palpable anomalies              Palpation: No palpable anomalies              Provocative Test(s):  Phalen's test: deferred Tinel's test: deferred Apley's scratch test (touch opposite shoulder):  Action 1 (Across chest): deferred Action 2 (Overhead): deferred Action 3 (LB reach): deferred   Provocative Test(s):  Phalen's test: deferred Tinel's test: deferred Apley's scratch test (touch opposite shoulder):  Action 1 (Across chest): deferred Action 2 (Overhead): deferred Action 3 (LB reach): deferred    Thoracic Spine Area Exam  Skin & Axial Inspection: No masses, redness, or swelling Alignment: Symmetrical Functional ROM: Unrestricted ROM Stability: No instability detected Muscle Tone/Strength: Functionally intact. No obvious neuro-muscular anomalies detected. Sensory (Neurological):  Unimpaired Muscle strength & Tone: No palpable anomalies  Lumbar Spine Area Exam  Skin & Axial Inspection: No masses, redness, or swelling Alignment: Symmetrical Functional ROM: Unrestricted ROM       Stability: No instability detected Muscle Tone/Strength: Functionally intact. No obvious neuro-muscular anomalies detected. Sensory (Neurological): Unimpaired Palpation: No palpable anomalies       Provocative Tests: Hyperextension/rotation test: deferred today       Lumbar quadrant test (Kemp's test): deferred today       Lateral bending test: deferred today       Patrick's Maneuver: deferred today                   FABER test: deferred today                   S-I anterior distraction/compression test: deferred today         S-I lateral compression test: deferred today         S-I Thigh-thrust test: deferred today         S-I Gaenslen's test: deferred today          Gait & Posture Assessment  Ambulation: Unassisted Gait: Relatively normal for age and body habitus Posture: WNL   Lower Extremity Exam    Side: Right lower extremity  Side: Left lower extremity  Stability: No instability observed          Stability: No instability observed          Skin & Extremity Inspection: Skin color, temperature, and hair growth are WNL. No peripheral edema or cyanosis. No masses, redness, swelling, asymmetry, or associated skin lesions. No contractures.  Skin & Extremity Inspection: Skin color, temperature, and hair growth are WNL.  No peripheral edema or cyanosis. No masses, redness, swelling, asymmetry, or associated skin lesions. No contractures.  Functional ROM: Unrestricted ROM                  Functional ROM: Unrestricted ROM                  Muscle Tone/Strength: Functionally intact. No obvious neuro-muscular anomalies detected.  Muscle Tone/Strength: Functionally intact. No obvious neuro-muscular anomalies detected.  Sensory (Neurological): Unimpaired  Sensory (Neurological): Unimpaired   Palpation: No palpable anomalies  Palpation: No palpable anomalies   Assessment  Primary Diagnosis & Pertinent Problem List: The primary encounter diagnosis was Chronic toe pain (Right). Diagnoses of Great toe pain (Right), Chronic sacroiliac joint pain (Right), Osteoarthritis of sacroiliac joint (Bilateral) (R>L), Chronic low back pain (Primary Area of Pain) (Bilateral) (R>L), Chronic pain syndrome, Disorder of skeletal system, Problems influencing health status, and Pharmacologic therapy were also pertinent to this visit.  Status Diagnosis  Worsening Worsening Controlled 1. Chronic toe pain (Right)   2. Great toe pain (Right)   3. Chronic sacroiliac joint pain (Right)   4. Osteoarthritis of sacroiliac joint (Bilateral) (R>L)   5. Chronic low back pain (Primary Area of Pain) (Bilateral) (R>L)   6. Chronic pain syndrome   7. Disorder of skeletal system   8. Problems influencing health status   9. Pharmacologic therapy     Problems updated and reviewed during this visit: Problem  Chronic toe pain (Right)  Great toe pain (Right)  Disorder of Skeletal System  Pharmacologic Therapy  Problems Influencing Health Status   Plan of Care  Pharmacotherapy (Medications Ordered): No orders of the defined types were placed in this encounter.  Medications administered today: [name] had no medications administered during this visit.   Procedure Orders     Small Joint Injection/Arthrocentesis     Small Joint Injection/Arthrocentesis  Lab Orders     HLA-B27 antigen     Uric acid, random urine     Uric acid     C-reactive protein     Sedimentation rate     Vitamin B12     25-Hydroxyvitamin D Lcms D2+D3  Imaging Orders     DG Toe Great Right Referral Orders  No referral(s) requested today    Interventional management options: Planned, scheduled, and/or pending:   Diagnostic right great toe intra-articular injection #1    Considering:   Diagnosticright lumbar facet  block #2  Possible right-sided lumbar facet RFA #1  Diagnostic right-sided sacroiliac joint block#4  Possible right-sided sacroiliac joint RFA #1  Palliative right-sided L4-5 LESI#2  Palliative left-sided L4-5 LESI #2  Palliative left-sided sacroiliac joint block#2  Possible left-sided sacroiliac joint RFA #1    Palliative PRN treatment(s):   Palliativeright gluteal MNB Diagnostic right-sided sacroiliac joint block Palliative right-sided L4-5 lumbar epidural steroid injection Palliative left-sided sacroiliac joint block Palliative right-sided lumbar facet blocks  Diagnostic right great toe intra-articular injection(s)    Provider-requested follow-up: Return for Procedure (no sedation): (R) Great Toe Inj. #1.  Future Appointments  Date Time Provider [place]  12/03/2017  2:40 PM [facility]  03/08/2018  1:30 PM Shannon Pheasant, MD [id number]   Primary Care Physician: Shannon Pheasant, MD Location: [facility] Note by: Shannon Cola, MD Date: 10/05/2017; Time: 12:02 PM

## 2017-10-05 ENCOUNTER — Encounter: Payer: Self-pay | Admitting: Pain Medicine

## 2017-10-05 ENCOUNTER — Ambulatory Visit
Admission: RE | Admit: 2017-10-05 | Discharge: 2017-10-05 | Disposition: A | Discharge status: Home / Self Care | Payer: Commercial Managed Care - HMO | Source: Ambulatory Visit | Attending: Pain Medicine | Admitting: Pain Medicine

## 2017-10-05 ENCOUNTER — Ambulatory Visit: Payer: Commercial Managed Care - HMO | Admitting: Pain Medicine

## 2017-10-05 ENCOUNTER — Other Ambulatory Visit: Payer: Self-pay

## 2017-10-05 VITALS — BP 129/96 | HR 71 | Temp 98.2°F | Resp 18 | Ht 62.0 in | Wt 124.0 lb

## 2017-10-05 DIAGNOSIS — M79674 Pain in right toe(s): Secondary | ICD-10-CM

## 2017-10-05 DIAGNOSIS — M533 Sacrococcygeal disorders, not elsewhere classified: Secondary | ICD-10-CM | POA: Insufficient documentation

## 2017-10-05 DIAGNOSIS — Z789 Other specified health status: Secondary | ICD-10-CM | POA: Insufficient documentation

## 2017-10-05 DIAGNOSIS — M19071 Primary osteoarthritis, right ankle and foot: Secondary | ICD-10-CM | POA: Diagnosis not present

## 2017-10-05 DIAGNOSIS — E0590 Thyrotoxicosis, unspecified without thyrotoxic crisis or storm: Secondary | ICD-10-CM | POA: Diagnosis not present

## 2017-10-05 DIAGNOSIS — M545 Low back pain: Secondary | ICD-10-CM

## 2017-10-05 DIAGNOSIS — Z800 Family history of malignant neoplasm of digestive organs: Secondary | ICD-10-CM | POA: Diagnosis not present

## 2017-10-05 DIAGNOSIS — Z9889 Other specified postprocedural states: Secondary | ICD-10-CM | POA: Diagnosis not present

## 2017-10-05 DIAGNOSIS — Z882 Allergy status to sulfonamides status: Secondary | ICD-10-CM | POA: Insufficient documentation

## 2017-10-05 DIAGNOSIS — N950 Postmenopausal bleeding: Secondary | ICD-10-CM | POA: Diagnosis not present

## 2017-10-05 DIAGNOSIS — Z885 Allergy status to narcotic agent status: Secondary | ICD-10-CM | POA: Insufficient documentation

## 2017-10-05 DIAGNOSIS — Z8051 Family history of malignant neoplasm of kidney: Secondary | ICD-10-CM | POA: Insufficient documentation

## 2017-10-05 DIAGNOSIS — K219 Gastro-esophageal reflux disease without esophagitis: Secondary | ICD-10-CM | POA: Diagnosis not present

## 2017-10-05 DIAGNOSIS — Z8249 Family history of ischemic heart disease and other diseases of the circulatory system: Secondary | ICD-10-CM | POA: Insufficient documentation

## 2017-10-05 DIAGNOSIS — M48061 Spinal stenosis, lumbar region without neurogenic claudication: Secondary | ICD-10-CM | POA: Diagnosis not present

## 2017-10-05 DIAGNOSIS — Z881 Allergy status to other antibiotic agents status: Secondary | ICD-10-CM | POA: Diagnosis not present

## 2017-10-05 DIAGNOSIS — D649 Anemia, unspecified: Secondary | ICD-10-CM | POA: Insufficient documentation

## 2017-10-05 DIAGNOSIS — M47818 Spondylosis without myelopathy or radiculopathy, sacral and sacrococcygeal region: Secondary | ICD-10-CM

## 2017-10-05 DIAGNOSIS — Z79899 Other long term (current) drug therapy: Secondary | ICD-10-CM | POA: Diagnosis not present

## 2017-10-05 DIAGNOSIS — Z9049 Acquired absence of other specified parts of digestive tract: Secondary | ICD-10-CM | POA: Insufficient documentation

## 2017-10-05 DIAGNOSIS — R3915 Urgency of urination: Secondary | ICD-10-CM | POA: Insufficient documentation

## 2017-10-05 DIAGNOSIS — G894 Chronic pain syndrome: Secondary | ICD-10-CM | POA: Diagnosis not present

## 2017-10-05 DIAGNOSIS — Z96642 Presence of left artificial hip joint: Secondary | ICD-10-CM | POA: Insufficient documentation

## 2017-10-05 DIAGNOSIS — F419 Anxiety disorder, unspecified: Secondary | ICD-10-CM | POA: Diagnosis not present

## 2017-10-05 DIAGNOSIS — G8929 Other chronic pain: Secondary | ICD-10-CM

## 2017-10-05 DIAGNOSIS — M7918 Myalgia, other site: Secondary | ICD-10-CM | POA: Diagnosis not present

## 2017-10-05 DIAGNOSIS — Z09 Encounter for follow-up examination after completed treatment for conditions other than malignant neoplasm: Secondary | ICD-10-CM | POA: Insufficient documentation

## 2017-10-05 DIAGNOSIS — Z806 Family history of leukemia: Secondary | ICD-10-CM | POA: Insufficient documentation

## 2017-10-05 DIAGNOSIS — Z801 Family history of malignant neoplasm of trachea, bronchus and lung: Secondary | ICD-10-CM | POA: Insufficient documentation

## 2017-10-05 DIAGNOSIS — G43909 Migraine, unspecified, not intractable, without status migrainosus: Secondary | ICD-10-CM | POA: Insufficient documentation

## 2017-10-05 DIAGNOSIS — M461 Sacroiliitis, not elsewhere classified: Secondary | ICD-10-CM

## 2017-10-05 DIAGNOSIS — Z803 Family history of malignant neoplasm of breast: Secondary | ICD-10-CM | POA: Diagnosis not present

## 2017-10-05 DIAGNOSIS — M899 Disorder of bone, unspecified: Secondary | ICD-10-CM | POA: Insufficient documentation

## 2017-10-05 DIAGNOSIS — Z8042 Family history of malignant neoplasm of prostate: Secondary | ICD-10-CM | POA: Insufficient documentation

## 2017-10-05 NOTE — Progress Notes (Signed)
Safety precautions to be maintained throughout the outpatient stay will include: orient to surroundings, keep bed in low position, maintain call bell within reach at all times, provide assistance with transfer out of bed and ambulation.  

## 2017-10-05 NOTE — Patient Instructions (Addendum)
____________________________________________________________________________________________  Gout    Mechanism: Uric acid accumulation.    Uric Acid: Uric acid is a heterocyclic compound of carbon, nitrogen, oxygen, and hydrogen with the formula [id number]. It forms ions and salts known as urates and acid urates such as ammonium acid urate. Uric acid is a product of the metabolic breakdown of purine nucleotides. High blood concentrations of uric acid can lead to gout. The chemical is associated with other medical conditions including diabetes and the formation of ammonium acid urate kidney stones.    Purines: Purines are found in high concentration in meat and meat products, especially internal organs such as liver and kidney. In general, plant-based diets are low in purines. Examples of high-purine sources include: sweetbreads, anchovies, sardines, liver, beef kidneys, brains, meat extracts (e.g., Oxo, Bovril), herring, mackerel, scallops, game meats, beer (from the yeast) and gravy. A moderate amount of purine is also contained in beef, pork, poultry, other fish and seafood, asparagus, cauliflower, spinach, mushrooms, green peas, lentils, dried peas, beans, oatmeal, wheat bran, wheat germ, and hawthorn. Higher levels of meat and seafood consumption are associated with an increased risk of gout, whereas a higher level of consumption of dairy products is associated with a decreased risk. Moderate intake of purine-rich vegetables or protein is not associated with an increased risk of gout.    Causes: Uric acid is generated as the body's tissues are broken down during normal cell turnover. Some people with gout generate too much uric acid (10%). Other patients with gout do not effectively eliminate their uric acid into the urine (90%). Genetics, gender,and nutrition (alcoholism, obesity) play key roles in the development of gout.   1. If your parents have gout, then you have a 20% chance of developing it.    2. [place] people are 5 times more likely to develop gout. 3. American blacks, but not African blacks, are more likely to have gout than other populations. 4. Use of alcohol, especially beer, increases the risk for gout.   5. Diets rich in red meats, internal organs, yeast, and oily fish increase the risk for gout.   6. Uric acid levels increase at puberty in men and at menopause in women, so men first develop gout at an earlier age (30s to 50s) than do women (50s to 70s). Gout in pre-menopausal women is distinctly unusual.   7. Attacks of gouty arthritis?can be precipitated when there is a sudden change in uric acid levels.   8. Overindulgence of alcohol and red meats   9. Trauma   10. Starvation and dehydration 11. IV contrast dyes   12. Chemotherapy     Some Possible Causes of Elevated Uric Acid Levels  Medication Diuretics used for weight loss or heart disease, insulin, some antibiotics, medication for rheumatoid arthritis, or an overdose of B vitamins can cause uric acid levels to rise. Diuretics reduce sodium, magnesium, calcium and potassium (among other things) levels. If you need to use a diuretic, see our natural herbal products for ones with fewer side effects. One customer reported getting gout when he took beta-blockers for his high blood pressure.     Poor kidney function When kidneys are not functioning at optimum levels, they lose their ability to excrete uric acid from the body. This situation may be due to various kidney problems or over-consumption of alcohol. When alcohol is metabolized, lactic acid is produced, which hinders uric acid excretion by the kidneys.     Dieting Severe dieting or fasting can cause excess lactic   acid, which hinders uric acid excretion by the kidneys. Crash and severe calorie restriction diets shock your metabolism and can trigger a gout attack. Dieting may also cause a loss of potassium, which can increase urate levels in the blood. As mentioned  above, some dieters also use diuretics to speed the process, and they can rob the body of potassium and other minerals, triggering a gout attack. It seems to be a vicious circle! However, a proper diet that is done slowly is recommended because losing weight will reduce serum levels of uric acid.    Diet Traditional thinking tells [place] that gout is the result of excessive amounts of alcohol, protein, heavy foods, coffee and soft drinks in your diet. Certain foods contain high levels of purine which can cause uric acid levels to rise. Purine is a protein substance that is transformed into uric acid during digestion. Reduction in consumption of these foods is very often successful in reducing or eliminating gout.     A potassium deficiency can increase urate levels in the blood. This is very important, and ways to correct it?are discussed above and under the diuretics section.    Drugs that increase serum uric acid  1. Aspirin (Low dose)  2. Diuretics  3. hypertensive medications   4. Nicotinic acid   5. Cyclosporine A   6. Acetaminophen (Tylenol)  7. Others     Pharmacological treatment:  1) Colchicine (PO)  Adverse Side Effects: nausea, vomiting, diarrhea  MAX: 6 mg/day during an acute attack  Goal: keep serum urate < 7.0 mg/dl Prophylactic Dose: 0.6 -1.2 mg/day  2) Probenecid (Uricosuric properties)  Mechanism of Action: increases uric acid excretion  Adverse Side Effects: overt nephrolithiasis (Kidney stones). Side effects of probenecid are uncommon and usually mild. In addition to causing kidney stones and precipitating acute gouty arthritis, side effects of probenecid include hair loss, skin rash, headache, nausea, sore gums, and fever. In rare instances, it has caused severe anemias  To Avoid Stones:  start at low doses  Stay well hydrated  Alkalinize urine  1) Sodium Bicarbonate  2) And/or Acetazolamide (Carbonic anhydrate inhibitor) Starting Dose: 250 mg bid and increase over  several weeks  3) Allopurinol  Mechanism of Action: Decreases uric acid production  Adverse Side Effects: fever, dermatitis, elevated liver enzymes, diarrhea, and vasculitis. The most frequent adverse reaction to allopurinol is skin rash. Allopurinol should be discontinued immediately at the first appearance of rash, painful urination, blood in the urine, eye irritation, or swelling of the mouth or lips, because these can be a signs of impending severe allergic reaction, which can be fatal. Rarely, allopurinol can cause nerve, kidney, and bone marrow damage.  Dose: 300 mg/day   Treatment  1. Drink 2 to 3 L of fluid daily.  2. Consume a moderate amount of protein. Limit meat, fish and poultry to 4 - 6 oz per day. Try other low-purine good protein foods such as low fat dairy products, tofu and eggs.  3. Limit fat intake by choosing leaner meats, foods prepared with less oils and lower fat dairy products  4. Aside from avoiding high purine foods, maintaining a healthy body weight is important for gout patients as well. Obesity can result in increased uric acid production by the body. Follow a well-balanced diet to lose excess body weight. Do not follow a high-protein low-carb diet as this can worsen gout conditions.  5. Keep the urine pH high (basic or non-acidic)  6. Colchicine, probenecid, allopurinol, sodium bicarbonate.  Prevention  If you are at risk for gout, you should   1. Eat a low-cholesterol, low-fat diet. People with gout have a higher risk for heart disease. This diet would not only lower your risk for gout but also your risk for heart disease.   2. Slowly lose weight. This can lower your uric acid levels. Losing weight too rapidly can occasionally precipitate gout attacks.   3. Restrict your?intake of alcohol, especially beer.   If you have had an attack of gouty arthritis, you should do all of the above and follow the regimen prescribed by your physician. The adequate prevention of  gouty arthritis may involve lifelong medical therapy.    Balanced Diet  According to the American Medical Association, a balanced diet for people with gout include foods:  1. High in complex carbohydrates (whole grains, fruits, vegetables)   2. Low in protein (15% of calories and sources should be soy, lean meats, poultry)   3. No more than 30% of calories from fat (10% animal fat)    Beneficial Foods  Foods which may be beneficial to people with gout include:  1. Dark berries may contain chemicals that lower uric acid and reduce inflammation.   2. Tofu which is made from soybeans may be a better choice than meats.   3. Certain fatty acids found in certain fish such as salmon, flax or olive oil, or nuts may possess some anti-inflammatory benefits.    Recommended Foods to Eat  1. Fresh cherries, strawberries, blueberries, and other red-blue berries   2. Bananas   3. Celery   4. Tomatoes   5. Vegetables including kale, cabbage, parsley, green-leafy vegetables   6. Foods high in bromelain (pineapple)   7. Foods high in vitamin C (red cabbage, red bell peppers, tangerines, mandarins, oranges, potatoes)   8. Drink fruit juices and purified water (8 glasses of water per day)   9. Low-fat dairy products   10. Complex carbohydrates (breads, cereals, pasta, rice, as well as aforementioned vegetables and fruits)   11. Chocolate, cocoa   12. Coffee, tea   13. Carbonated beverages   14. Essential fatty acids (tuna and salmon, flaxseed, nuts, seeds)   15. Tofu, although a legume and made from soybeans, may be a better choice than meat    Foods to Avoid  Diets which are high in purines and high in protein have long been suspected of causing an increased risk of gout .    According to the American Medical Association, purine-containing foods include:  1. Beer, other alcoholic beverages. Limit alcohol consumption to 1 drink 3 times a week.  2. Anchovies, sardines in oil, fish roes, herring,  Mackerel, Scallops, mussels  3. Yeast. (Beer), whole grain breads and cereals, oatmeal  4. Organ meat (liver, kidneys, brains, sweetbreads)   5. Processed meats (hot dogs, lunch meats, etc.),  6. Legumes (dried beans, peas, lima beans)   7. Meat extracts, consomm, broth, bouillon, gravies. (e.g Oxo, Bovril)  8. Mushrooms, spinach, asparagus, cauliflower, mushrooms.  9. Chicken, duck, ham, [place], Game meats   10. Fried foods, roasted nuts, any food cooked in oil (heated oil destroys vitamin E)  11. Rich foods (cakes, sugar products, white flour products)  12. Dried fruits  13. Caffeine  14. Eggs    NOTE: It is important to remember that purines are found in all protein foods. All sources of purines should not be eliminated.    Urine at pH 7.0 is neutral and elimination of  uric acid decreases by approximately 50% at pH 6.5. The pka of uric acid is 5.75.  In urine at pH 5.0, only 15% of uric acid exists in solution. The solubility increases more than 10-fold at pH 7.0 and more than 100-fold at pH 8.0.    Extremely Alkaline Forming Foods - pH 8.5 to 9.0:  Lemons, Watermelon , Agar Agar , Cantaloupe, Cayenne (Capsicum), Dried dates & figs, Kelp, [place], [place] root, Limes, [place], Melons, Papaya, [place], [place] grapes (sweet), Watercress, Seaweed    Moderate Alkaline - pH 7.5 to 8.0  Apples (sweet), Apricots, Alfalfa sprouts Arrowroot, Avocados, Bananas (ripe), Berries, Carrots, Celery, Currants, Dates & figs (fresh), Garlic , Gooseberry, Grapes (less sweet), Grapefruit, Guavas, Herbs (leafy green), Lettuce (leafy green), Nectarine, Peaches (sweet), Pears (less sweet), Peas (fresh sweet), Persimmon, Pumpkin (sweet), Sea salt , Spinach, Apples (sour), Bamboo shoots, Beans (fresh green), Beets, Bell Pepper, Broccoli, Cabbage, Cauliflower, Carob , Daikon, Ginger (fresh), Grapes (sour), Kale, Kohlrabi, Lettuce (pale green), Oranges, Parsnip, Peaches (less sweet), Peas (less sweet), Potatoes & skin,  Pumpkin (less sweet), Raspberry, Sapote, Strawberry, Squash , Sweet corn (fresh), Tamari , Turnip, Sour Dairy    Slightly Alkaline to Neutral pH 7.0  Almonds , Artichokes ([place]), Barley-Malt (sweetener-[place]), [employer] Rice Syrup, [employer], Cherries, Coconut (fresh), Cucumbers, [employer] plant, Honey (raw), Leeks, Miso, Okra, Olives ripe , [place], [place] (home made with brown rice vinegar), Radish, Sea salt , Spices , Taro, Tomatoes (sweet), Vinegar (sweet brown rice), Water Chestnut, Amaranth, Artichoke (globe), Chestnuts (dry roasted), Egg yolks (soft cooked), Goat's milk and whey (raw) , Horseradish, Mayonnaise (home made), Millet, Olive oil, Quinoa, Rhubarb, Sesame seeds (whole) , Soy beans (dry), Sprouted grains , Tempeh, Tofu, Tomatoes (less sweet)    Slightly Acid to Neutral pH 7.0  Barley malt syrup, Barley, Bran, Cashews, Cereals (unrefined with honey-fruit-maple syrup), Cornmeal, Fructose, Honey (pasteurized), Lentils, Macadamias, Maple syrup (unprocessed),Low Fat Milk (homogenized) and most processed dairy products, Molasses organic , Nutmeg, Mustard, Pistachios, Popcorn (plain), Rice or wheat crackers (unrefined), Rye (grain), Rye bread (organic sprouted), Seeds (pumpkin & sunflower), Walnuts Blueberries, [place] nuts, Butter (salted), Cheeses (mild & crumbly) , Crackers (unrefined rye), Dried beans (mung, adzuki, pinto, kidney, garbanzo) , Dry coconut, Egg whites, Goats milk (homogenized), Olives (pickled), Pecans, Plums , Prunes , Butter (fresh unsalted), Cream (fresh & raw), Milk (raw cow's) , Whey (cow's)     ACID FORMING FOODS FATS & OILS  Avocado Oil, Canola Oil, Corn Oil, Hemp Seed Oil, Flax Oil, Lard, Olive Oil, Safflower Oil, Sesame Oil, Sunflower Oil    FRUITS  Cranberries    GRAINS  Rice Cakes, Wheat Cakes, Amaranth, Barley, Buckwheat, Oats (rolled), Quinoa, Rice, Rye, Spelt, [place], Wheat, Hemp Seed Flour    NUTS & BUTTERS  Cashews, [place] Nuts, Peanuts, Processed Peanut  Butter, Pecans, Tahini    ANIMAL PROTEIN  Beef, Carp, Clams, Fish, [place], [place], Mussels, [place], [employer], Rabbit, [place], Shrimp, [place], [place], [place], Therapist, nutritional    PASTA (WHITE)  Noodles, Macaroni, Spaghetti Distilled Vinegar, Wheat Germ    BEANS & LEGUMES  Black Beans, Chick Peas, Green Peas, Kidney Beans, Lentils, [place] Beans, Pinto Beans, Red Beans, Soy Beans, Soy Milk, White Beans, Rice Milk, Almond Milk    DRUGS & CHEMICALS  Aspartame, Chemicals, Drugs (Medicinal), Drugs (Psychedelic), Pesticides, Herbicides    ALCOHOL  Beer, Spirits, Hard Liquor, Wine    ACTIVITIES  Overwork, Anger, Fear, Jealousy, Stress    Moderate Acid - pH 6.0 to 6.5  Cigarette tobacco, Cream of Wheat (  unrefined), Fish, Fruit juices with sugar, Maple syrup (processed), Molasses (sulphured), Pickles (commercial), Breads (refined) of corn, oats, rice & rye, Cereals (refined), corn flakes, Shellfish, Wheat germ, Whole Wheat foods , Wine , Yogurt (sweetened) Bananas (green), Buckwheat, Cheeses (sharp), Corn & rice breads, Egg whole (cooked hard), Ketchup, Mayonnaise, Oats, Pasta (whole grain), Pastry (wholegrain & honey), Peanuts, Potatoes (with no skins), Popcorn (with salt & butter), Rice (basmati), Rice (brown), Soy sauce (commercial), Tapioca, Wheat bread (sprouted organic)    Extremely Acid Forming Foods - pH 5.0 to 5.5  Artificial sweeteners, Beef, Carbonated soft drinks & fizzy drinks , Cigarettes (tailor made), Drugs, Flour (white wheat), Goat, Lamb, Pastries & cakes from white flour, Pork, Sugar (white) , Beer , Brown sugar , Chicken, [place], Chocolate, Coffee , Custard with white sugar, Jams, Jellies, Liquor , Pasta (white), Rabbit, [place], Table salt refined & iodized, Tea black, [place], Wheat bread, White rice, White vinegar (processed).    Research Update:   A recent study published in the [place] [employer] on Apr 07, 2002 revealed that high intake of low-fat dairy products indeed reduces  the risk of gout by 50%. It is unknown why low-fat dairy products offer a protective effect.   Unfortunately, no natural supplements are proven effective to prevent or alleviate onset of acute gout attacks. The most effective treatment for gout attack is medication.   ____________________________________________________________________________________________  ____________________________________________________________________________________________  Preparing for your procedure (without sedation)  Instructions: . Oral Intake: Do not eat or drink anything for at least 3 hours prior to your procedure. . Transportation: Unless otherwise stated by your physician, you may drive yourself after the procedure. . Blood Pressure Medicine: Take your blood pressure medicine with a sip of water the morning of the procedure. . Blood thinners: Notify our staff if you are taking any blood thinners. Depending on which one you take, there will be specific instructions on how and when to stop it. . Diabetics on insulin: Notify the staff so that you can be scheduled 1st case in the morning. If your diabetes requires high dose insulin, take only  of your normal insulin dose the morning of the procedure and notify the staff that you have done so. . Preventing infections: Shower with an antibacterial soap the morning of your procedure.  . Build-up your immune system: Take 1000 mg of Vitamin C with every meal (3 times a day) the day prior to your procedure. Marland Kitchen Antibiotics: Inform the staff if you have a condition or reason that requires you to take antibiotics before dental procedures. . Pregnancy: If you are pregnant, call and cancel the procedure. . Sickness: If you have a cold, fever, or any active infections, call and cancel the procedure. . Arrival: You must be in the facility at least 30 minutes prior to your scheduled procedure. . Children: Do not bring any children with you. . Dress appropriately: Bring dark  clothing that you would not mind if they get stained. . Valuables: Do not bring any jewelry or valuables.  Procedure appointments are reserved for interventional treatments only. Marland Kitchen No Prescription Refills. . No medication changes will be discussed during procedure appointments. . No disability issues will be discussed.  Reasons to call and reschedule or cancel your procedure: (Following these recommendations will minimize the risk of a serious complication.) . Surgeries: Avoid having procedures within 2 weeks of any surgery. (Avoid for 2 weeks before or after any surgery). . Flu Shots: Avoid having procedures within 2 weeks of  a flu shots or . (Avoid for 2 weeks before or after immunizations). . Barium: Avoid having a procedure within 7-10 days after having had a radiological study involving the use of radiological contrast. (Myelograms, Barium swallow or enema study). . Heart attacks: Avoid any elective procedures or surgeries for the initial 6 months after a "Myocardial Infarction" (Heart Attack). . Blood thinners: It is imperative that you stop these medications before procedures. Let us know if you if you take any blood thinner.  . Infection: Avoid procedures during or within two weeks of an infection (including chest colds or gastrointestinal problems). Symptoms associated with infections include: Localized redness, fever, chills, night sweats or profuse sweating, burning sensation when voiding, cough, congestion, stuffiness, runny nose, sore throat, diarrhea, nausea, vomiting, cold or Flu symptoms, recent or current infections. It is specially important if the infection is over the area that we intend to treat. Marland Kitchen Heart and lung problems: Symptoms that may suggest an active cardiopulmonary problem include: cough, chest pain, breathing difficulties or shortness of breath, dizziness, ankle swelling, uncontrolled high or unusually low blood pressure, and/or palpitations. If you are experiencing any  of these symptoms, cancel your procedure and contact your primary care physician for an evaluation.  Remember:  Regular Business hours are:  Monday to Thursday 8:00 AM to 4:00 PM  Provider's Schedule: Milinda Pointer, MD:  Procedure days: Tuesday and Thursday 7:30 AM to 4:00 PM  Gillis Santa, MD:  Procedure days: Monday and Wednesday 7:30 AM to 4:00 PM ____________________________________________________________________________________________

## 2017-10-06 DIAGNOSIS — J3089 Other allergic rhinitis: Secondary | ICD-10-CM | POA: Diagnosis not present

## 2017-10-06 DIAGNOSIS — J301 Allergic rhinitis due to pollen: Secondary | ICD-10-CM | POA: Diagnosis not present

## 2017-10-06 LAB — URIC ACID, RANDOM URINE: Uric Acid, Ur: 10.5 mg/dL

## 2017-10-09 LAB — SEDIMENTATION RATE: Sed Rate: 7 mm/hr (ref 0–40)

## 2017-10-09 LAB — URIC ACID: URIC ACID: 3.4 mg/dL (ref 2.5–7.1)

## 2017-10-09 LAB — 25-HYDROXYVITAMIN D LCMS D2+D3: 25-HYDROXY, VITAMIN D: 28 ng/mL — AB

## 2017-10-09 LAB — VITAMIN B12: Vitamin B-12: 368 pg/mL (ref 232–1245)

## 2017-10-09 LAB — HLA-B27 ANTIGEN: HLA B27: NEGATIVE

## 2017-10-09 LAB — C-REACTIVE PROTEIN: CRP: 2 mg/L (ref 0–10)

## 2017-10-09 LAB — 25-HYDROXY VITAMIN D LCMS D2+D3: 25-Hydroxy, Vitamin D-3: 28 ng/mL

## 2017-10-13 DIAGNOSIS — J301 Allergic rhinitis due to pollen: Secondary | ICD-10-CM | POA: Diagnosis not present

## 2017-10-13 DIAGNOSIS — J3081 Allergic rhinitis due to animal (cat) (dog) hair and dander: Secondary | ICD-10-CM | POA: Diagnosis not present

## 2017-10-14 ENCOUNTER — Other Ambulatory Visit: Payer: Self-pay | Admitting: Internal Medicine

## 2017-10-15 ENCOUNTER — Encounter: Payer: Self-pay | Admitting: Pain Medicine

## 2017-10-15 ENCOUNTER — Ambulatory Visit
Admission: RE | Admit: 2017-10-15 | Discharge: 2017-10-15 | Disposition: A | Discharge status: Home / Self Care | Payer: Commercial Managed Care - HMO | Source: Ambulatory Visit | Attending: Pain Medicine | Admitting: Pain Medicine

## 2017-10-15 ENCOUNTER — Other Ambulatory Visit: Payer: Self-pay

## 2017-10-15 ENCOUNTER — Ambulatory Visit (HOSPITAL_BASED_OUTPATIENT_CLINIC_OR_DEPARTMENT_OTHER): Payer: Commercial Managed Care - HMO | Admitting: Pain Medicine

## 2017-10-15 VITALS — BP 117/65 | HR 56 | Temp 98.6°F | Resp 16 | Ht 62.0 in | Wt 125.0 lb

## 2017-10-15 DIAGNOSIS — M79674 Pain in right toe(s): Secondary | ICD-10-CM | POA: Diagnosis not present

## 2017-10-15 DIAGNOSIS — G8929 Other chronic pain: Secondary | ICD-10-CM | POA: Insufficient documentation

## 2017-10-15 DIAGNOSIS — Z79899 Other long term (current) drug therapy: Secondary | ICD-10-CM | POA: Insufficient documentation

## 2017-10-15 DIAGNOSIS — Z883 Allergy status to other anti-infective agents status: Secondary | ICD-10-CM | POA: Diagnosis not present

## 2017-10-15 DIAGNOSIS — Z888 Allergy status to other drugs, medicaments and biological substances status: Secondary | ICD-10-CM | POA: Diagnosis not present

## 2017-10-15 DIAGNOSIS — Z882 Allergy status to sulfonamides status: Secondary | ICD-10-CM | POA: Diagnosis not present

## 2017-10-15 DIAGNOSIS — M19071 Primary osteoarthritis, right ankle and foot: Secondary | ICD-10-CM | POA: Insufficient documentation

## 2017-10-15 DIAGNOSIS — Z885 Allergy status to narcotic agent status: Secondary | ICD-10-CM | POA: Insufficient documentation

## 2017-10-15 MED ORDER — METHYLPREDNISOLONE ACETATE 80 MG/ML IJ SUSP
80.0000 mg | Freq: Once | INTRAMUSCULAR | Status: AC
  Administered 2017-10-15: 80 mg

## 2017-10-15 MED ORDER — METHYLPREDNISOLONE ACETATE 80 MG/ML IJ SUSP
INTRAMUSCULAR | Status: AC
  Filled 2017-10-15: qty 1

## 2017-10-15 MED ORDER — ROPIVACAINE HCL 2 MG/ML IJ SOLN
1.0000 mL | Freq: Once | INTRAMUSCULAR | Status: AC
  Administered 2017-10-15: 1 mL

## 2017-10-15 MED ORDER — ROPIVACAINE HCL 2 MG/ML IJ SOLN
INTRAMUSCULAR | Status: AC
  Filled 2017-10-15: qty 10

## 2017-10-15 MED ORDER — LIDOCAINE HCL 2 % IJ SOLN
INTRAMUSCULAR | Status: AC
  Filled 2017-10-15: qty 20

## 2017-10-15 MED ORDER — LIDOCAINE HCL 2 % IJ SOLN
20.0000 mL | Freq: Once | INTRAMUSCULAR | Status: AC
  Administered 2017-10-15: 400 mg

## 2017-10-15 NOTE — Telephone Encounter (Signed)
Last OV 08/31/2017   Last refilled 08/16/2017 disp 30 with 1 refill   Sent to PCP for approval

## 2017-10-15 NOTE — Progress Notes (Addendum)
Patient's Name: Shannon [name]  MRN: [id number]  Referring Provider: [name], MD  DOB: [birth year]  PCP: [name], MD  DOS: 10/15/2017  Note by: Gaspar Cola, MD  Service setting: Ambulatory outpatient  Specialty: Interventional Pain Management  Patient type: Established  Location: [facility] (AMB) Pain Management Facility  Visit type: Interventional Procedure   Primary Reason for Visit: Interventional Pain Management Treatment. CC: Toe Pain (right big)  Procedure:          Anesthesia, Analgesia, Anxiolysis:  Type: Diagnostic intra-articular small joint injection 1st MTP #1  Region: Dorsal Metatarsophalangeal (MTP) joint       Level: Foot Laterality: Right  Type: Local Anesthesia Indication(s): Analgesia         Local Anesthetic: Lidocaine 1-2% Route: Infiltration ([place]/IM) IV Access: Declined Sedation: Declined    Position: Supine   Indications: 1. Osteoarthritis of great toe joint (Right)   2. Chronic toe pain (Right)   3. Great toe pain (Right)    Pain Score: Pre-procedure: 3 /10 Post-procedure: 3 /10  Pre-op Assessment:  Shannon [name] is a 52 y.o. (year old), female patient, seen today for interventional treatment. She  has a past surgical history that includes Septoplasty (5/90); Nasal sinus surgery (11/93); Cesarean section (11/98); Tubal ligation (02/1998); Umbilical hernia repair (02/1998); Cholecystectomy (11/05); Joint replacement (Left, 2017); and Breast biopsy (Right, 12/05/2015). Shannon [name] has a current medication list which includes the following prescription(s): acetaminophen, nystatin cream, omeprazole, ranitidine, and alprazolam. Her primarily concern today is the Toe Pain (right big)  Initial Vital Signs:  Pulse/HCG Rate: 68  Temp: 98.6 F (37 C) Resp: 16 BP: 134/81 SpO2: 100 %  BMI: Estimated body mass index is 22.86 kg/m as calculated from the following:   Height as of this encounter: 5\' 2"  (1.575 m).   Weight as of this encounter: 125 lb (56.7  kg).  Risk Assessment: Allergies: Reviewed. She is allergic to codeine; decongest-aid [pseudoephedrine]; flagyl [metronidazole]; and sulfa antibiotics.  Allergy Precautions: None required Coagulopathies: Reviewed. None identified.  Blood-thinner therapy: None at this time Active Infection(s): Reviewed. None identified. Shannon [name] is afebrile  Site Confirmation: Shannon [name] was asked to confirm the procedure and laterality before marking the site Procedure checklist: Completed Consent: Before the procedure and under the influence of no sedative(s), amnesic(s), or anxiolytics, the patient was informed of the treatment options, risks and possible complications. To fulfill our ethical and legal obligations, as recommended by the American Medical Association's Code of Ethics, I have informed the patient of my clinical impression; the nature and purpose of the treatment or procedure; the risks, benefits, and possible complications of the intervention; the alternatives, including doing nothing; the risk(s) and benefit(s) of the alternative treatment(s) or procedure(s); and the risk(s) and benefit(s) of doing nothing. The patient was provided information about the general risks and possible complications associated with the procedure. These may include, but are not limited to: failure to achieve desired goals, infection, bleeding, organ or nerve damage, allergic reactions, paralysis, and death. In addition, the patient was informed of those risks and complications associated to the procedure, such as failure to decrease pain; infection; bleeding; organ or nerve damage with subsequent damage to sensory, motor, and/or autonomic systems, resulting in permanent pain, numbness, and/or weakness of one or several areas of the body; allergic reactions; (i.e.: anaphylactic reaction); and/or death. Furthermore, the patient was informed of those risks and complications associated with the medications. These include, but are  not limited to: allergic reactions (i.e.: anaphylactic  or anaphylactoid reaction(s)); adrenal axis suppression; blood sugar elevation that in diabetics may result in ketoacidosis or comma; water retention that in patients with history of congestive heart failure may result in shortness of breath, pulmonary edema, and decompensation with resultant heart failure; weight gain; swelling or edema; medication-induced neural toxicity; particulate matter embolism and blood vessel occlusion with resultant organ, and/or nervous system infarction; and/or aseptic necrosis of one or more joints. Finally, the patient was informed that Medicine is not an exact science; therefore, there is also the possibility of unforeseen or unpredictable risks and/or possible complications that may result in a catastrophic outcome. The patient indicated having understood very clearly. We have given the patient no guarantees and we have made no promises. Enough time was given to the patient to ask questions, all of which were answered to the patient's satisfaction. Shannon [name] has indicated that she wanted to continue with the procedure. Attestation: I, the ordering provider, attest that I have discussed with the patient the benefits, risks, side-effects, alternatives, likelihood of achieving goals, and potential problems during recovery for the procedure that I have provided informed consent. Date  Time: 10/15/2017 12:39 PM  Pre-Procedure Preparation:  Monitoring: As per clinic protocol. Respiration, ETCO2, SpO2, BP, heart rate and rhythm monitor placed and checked for adequate function Safety Precautions: Patient was assessed for positional comfort and pressure points before starting the procedure. Time-out: I initiated and conducted the "Time-out" before starting the procedure, as per protocol. The patient was asked to participate by confirming the accuracy of the "Time Out" information. Verification of the correct person, site, and  procedure were performed and confirmed by me, the nursing staff, and the patient. "Time-out" conducted as per Joint Commission's Universal Protocol (UP.01.01.01). Time: 1311  Description of Procedure:          Target Area: 1st MTP joint Approach: Dorsal approach. Area Prepped: Entire foot Region Prepping solution: ChloraPrep (2% chlorhexidine gluconate and 70% isopropyl alcohol) Safety Precautions: Aspiration looking for blood return was conducted prior to all injections. At no point did we inject any substances, as a needle was being advanced. No attempts were made at seeking any paresthesias. Safe injection practices and needle disposal techniques used. Medications properly checked for expiration dates. SDV (single dose vial) medications used. Description of the Procedure: Protocol guidelines were followed. The patient was placed in position. The target area was identified and the area prepped in the usual manner. Skin & deeper tissues infiltrated with local anesthetic. Appropriate amount of time allowed to pass for local anesthetics to take effect. The procedure needles were then advanced to the target area. Proper needle placement secured. Negative aspiration confirmed. Solution injected in intermittent fashion, asking for systemic symptoms every 0.5cc of injectate. The needles were then removed and the area cleansed, making sure to leave some of the prepping solution back to take advantage of its long term bactericidal properties.      Vitals:   10/15/17 1237 10/15/17 1305 10/15/17 1316  BP: 134/81 128/74 117/65  Pulse: 68 (!) 55 (!) 56  Resp: 16 14 16   Temp: 98.6 F (37 C)    SpO2: 100% 99% 99%  Weight: 125 lb (56.7 kg)    Height: 5\' 2"  (1.575 m)       Start Time: 1311 hrs. End Time: 1315 hrs.  Materials:  Needle(s) Type: Spinal Needle Gauge: 25G Length: 1.5-in Medication(s): Please see orders for medications and dosing details.  Imaging Guidance:  Type of Imaging  Technique: Fluoroscopy Guidance (Non-spinal) Indication(s): Assistance in needle guidance and placement for procedures requiring needle placement in or near specific anatomical locations not easily accessible without such assistance. Exposure Time: Please see nurses notes. Contrast: None used. Fluoroscopic Guidance: I was personally present during the use of fluoroscopy. "Tunnel Vision Technique" used to obtain the best possible view of the target area. Parallax error corrected before commencing the procedure. "Direction-depth-direction" technique used to introduce the needle under continuous pulsed fluoroscopy. Once target was reached, antero-posterior, oblique, and lateral fluoroscopic projection used confirm needle placement in all planes. Images permanently stored in EMR. Ultrasound Guidance: N/A     Interpretation: No contrast injected. I personally interpreted the imaging intraoperatively. Adequate needle placement confirmed in multiple planes. Permanent images saved into the patient's record.  Antibiotic Prophylaxis:   Anti-infectives (From admission, onward)   None     Indication(s): None identified  Post-operative Assessment:  Post-procedure Vital Signs:  Pulse/HCG Rate: (!) 56  Temp: 98.6 F (37 C) Resp: 16 BP: 117/65 SpO2: 99 %  EBL: None  Complications: No immediate post-treatment complications observed by team, or reported by patient.  Note: The patient tolerated the entire procedure well. A repeat set of vitals were taken after the procedure and the patient was kept under observation following institutional policy, for this type of procedure. Post-procedural neurological assessment was performed, showing return to baseline, prior to discharge. The patient was provided with post-procedure discharge instructions, including a section on how to identify potential problems. Should any problems arise concerning this procedure, the patient was given instructions to immediately  contact us, at any time, without hesitation. In any case, we plan to contact the patient by telephone for a follow-up status report regarding this interventional procedure.  Comments:  No additional relevant information.  Plan of Care    Imaging Orders     DG C-Arm 1-60 Min-No Report  Procedure Orders     Small Joint Injection/Arthrocentesis  Medications ordered for procedure: Meds ordered this encounter  Medications  . lidocaine (XYLOCAINE) 2 % (with pres) injection 400 mg  . ropivacaine (PF) 2 mg/mL (0.2%) (NAROPIN) injection 1 mL  . methylPREDNISolone acetate (DEPO-MEDROL) injection 80 mg   Medications administered: We administered lidocaine, ropivacaine (PF) 2 mg/mL (0.2%), and methylPREDNISolone acetate.  See the medical record for exact dosing, route, and time of administration.  New Prescriptions   No medications on file   Disposition: Discharge home  Discharge Date & Time: 10/15/2017; 1322 hrs.   Physician-requested Follow-up: Return for post-procedure eval (2 wks), w/ Dr. Dossie Arbour.  Future Appointments  Date Time Provider [place]  11/04/2017  9:15 AM Milinda Pointer, MD [facility] None  12/03/2017  2:40 PM [facility]  03/08/2018  1:30 PM [name], MD [facility]   Primary Care Physician: [name], MD Location: [facility] Note by: Gaspar Cola, MD Date: 10/15/2017; Time: 4:16 PM  Disclaimer:  Medicine is not an Chief Strategy Officer. The only guarantee in medicine is that nothing is guaranteed. It is important to note that the decision to proceed with this intervention was based on the information collected from the patient. The Data and conclusions were drawn from the patient's questionnaire, the interview, and the physical examination. Because the information was provided in large part by the patient, it cannot be guaranteed that it has not been purposely or unconsciously manipulated. Every effort has  been made to obtain as much relevant data as possible for  this evaluation. It is important to note that the conclusions that lead to this procedure are derived in large part from the available data. Always take into account that the treatment will also be dependent on availability of resources and existing treatment guidelines, considered by other Pain Management Practitioners as being common knowledge and practice, at the time of the intervention. For Medico-Legal purposes, it is also important to point out that variation in procedural techniques and pharmacological choices are the acceptable norm. The indications, contraindications, technique, and results of the above procedure should only be interpreted and judged by a Board-Certified Interventional Pain Specialist with extensive familiarity and expertise in the same exact procedure and technique.

## 2017-10-15 NOTE — Progress Notes (Signed)
Safety precautions to be maintained throughout the outpatient stay will include: orient to surroundings, keep bed in low position, maintain call bell within reach at all times, provide assistance with transfer out of bed and ambulation.  

## 2017-10-15 NOTE — Telephone Encounter (Signed)
ok'd rx for alprazolam #30 with one refill.   

## 2017-10-15 NOTE — Patient Instructions (Addendum)
____________________________________________________________________________________________  Post-Procedure Discharge Instructions  Instructions:  Apply ice: Fill a plastic sandwich bag with crushed ice. Cover it with a small towel and apply to injection site. Apply for 15 minutes then remove x 15 minutes. Repeat sequence on day of procedure, until you go to bed. The purpose is to minimize swelling and discomfort after procedure.  Apply heat: Apply heat to procedure site starting the day following the procedure. The purpose is to treat any soreness and discomfort from the procedure.  Food intake: Start with clear liquids (like water) and advance to regular food, as tolerated.   Physical activities: Keep activities to a minimum for the first 8 hours after the procedure.   Driving: If you have received any sedation, you are not allowed to drive for 24 hours after your procedure.  Blood thinner: Restart your blood thinner 6 hours after your procedure. (Only for those taking blood thinners)  Insulin: As soon as you can eat, you may resume your normal dosing schedule. (Only for those taking insulin)  Infection prevention: Keep procedure site clean and dry.  Post-procedure Pain Diary: Extremely important that this be done correctly and accurately. Recorded information will be used to determine the next step in treatment.  Pain evaluated is that of treated area only. Do not include pain from an untreated area.  Complete every hour, on the hour, for the initial 8 hours. Set an alarm to help you do this part accurately.  Do not go to sleep and have it completed later. It will not be accurate.  Follow-up appointment: Keep your follow-up appointment after the procedure. Usually 2 weeks for most procedures. (6 weeks in the case of radiofrequency.) Bring you pain diary.   Expect:  From numbing medicine (AKA: Local Anesthetics): Numbness or decrease in pain.  Onset: Full effect within 15  minutes of injected.  Duration: It will depend on the type of local anesthetic used. On the average, 1 to 8 hours.   From steroids: Decrease in swelling or inflammation. Once inflammation is improved, relief of the pain will follow.  Onset of benefits: Depends on the amount of swelling present. The more swelling, the longer it will take for the benefits to be seen. In some cases, up to 10 days.  Duration: Steroids will stay in the system x 2 weeks. Duration of benefits will depend on multiple posibilities including persistent irritating factors.  Occasional side-effects: Facial flushing, cramps (if present, drink Gatorade and take over-the-counter Magnesium 450-500 mg once to twice a day).  From procedure: Some discomfort is to be expected once the numbing medicine wears off. This should be minimal if ice and heat are applied as instructed.  Call if:  You experience numbness and weakness that gets worse with time, as opposed to wearing off.  New onset bowel or bladder incontinence. (This applies to Spinal procedures only)  Emergency Numbers:  [place] business hours (Monday - Thursday, 8:00 AM - 4:00 PM) (Friday, 9:00 AM - 12:00 Noon): [phone]  After hours: [phone] ____________________________________________________________________________________________   ____________________________________________________________________________________________  Post-Procedure Information  What to expect: Most procedures involve the use of a local anesthetic (numbing medicine), and a steroid (anti-inflammatory medicine).  The local anesthetics may cause temporary numbness and weakness of the legs or arms, depending on the location of the block. This numbness/weakness may last 4-6 hours, depending on the local anesthetic used. In rare instances, it can last up to 24 hours. While numb, you must be very careful not to injure  the extremity.  After any procedure, you could expect the  pain to get better within 15-20 minutes. This relief is temporary and may last 4-6 hours. Once the local anesthetics wears off, you could experience discomfort, possibly more than usual, for up to 10 (ten) days. In the case of radiofrequencies, it may last up to 6 weeks. Surgeries may take up to 8 weeks for the healing process. The discomfort is due to the irritation caused by needles going through skin and muscle. To minimize the discomfort, we recommend using ice the first day, and heat from then on. The ice should be applied for 15 minutes on, and 15 minutes off. Keep repeating this cycle until bedtime. Avoid applying the ice directly to the skin, to prevent frostbite. Heat should be used daily, until the pain improves (4-10 days). Be careful not to burn yourself.  Occasionally you may experience muscle spasms or cramps. These occur as a consequence of the irritation caused by the needle sticks to the muscle and the blood that will inevitably be lost into the surrounding muscle tissue. Blood tends to be very irritating to tissues, which tend to react by going into spasm. These spasms may start the same day of your procedure, but they may also take days to develop. This late onset type of spasm or cramp is usually caused by electrolyte imbalances triggered by the steroids, at the level of the kidney. Cramps and spasms tend to respond well to muscle relaxants, multivitamins (some are triggered by the procedure, but may have their origins in vitamin deficiencies), and "Gatorade", or any sports drinks that can replenish any electrolyte imbalances. (If you are a diabetic, ask your pharmacist to get you a sugar-free brand.) Warm showers or baths may also be helpful. Stretching exercises are highly recommended.  General Instructions:  Be alert for signs of possible infection: redness, swelling, heat, red streaks, elevated temperature, and/or fever. These typically appear 4 to 6 days after the procedure. Immediately  notify your doctor if you experience unusual bleeding, difficulty breathing, or loss of bowel or bladder control. If you experience increased pain, do not increase your pain medicine intake, unless instructed by your pain physician.  Post-Procedure Care:  Be careful in moving about. Muscle spasms in the area of the injection may occur. Applying ice or heat to the area is often helpful. The incidence of spinal headaches after epidural injections ranges between 1.4% and 6%. If you develop a headache that does not seem to respond to conservative therapy, please let your physician know. This can be treated with an epidural blood patch.   Post-procedure numbness or redness is to be expected, however it should average 4 to 6 hours. If numbness and weakness of your extremities begins to develop 4 to 6 hours after your procedure, and is felt to be progressing and worsening, immediately contact your physician.    Diet:  If you experience nausea, do not eat until this sensation goes away. If you had a "Stellate Ganglion Block" for upper extremity "Reflex Sympathetic Dystrophy", do not eat or drink until your hoarseness goes away. In any case, always start with liquids first and if you tolerate them well, then slowly progress to more solid foods.  Activity:  For the first 4 to 6 hours after the procedure, use caution in moving about as you may experience numbness and/or weakness. Use caution in cooking, using household electrical appliances, and climbing steps.  If you need to reach your Doctor call our office: [phone] Monday-Thursday 8:00 am - 4:00 PM    Fridays: Closed      In case of an emergency: In case of emergency, call 911 or go to the nearest emergency room and have the physician there call us.  Interpretation of Procedure Every nerve block has two components: a diagnostic component, and a treatment component. Unrealistic expectations are the most common causes of "perceived failure".  In a  perfect world, a single nerve block should be able to completely and permanently eliminate the pain. Sadly, the world is not perfect.  Most pain management nerve blocks are performed using local anesthetics and steroids. Steroids are responsible for any long-term benefit that you may experience. Their purpose is to decrease any chronic swelling that may exist in the area. Steroids begin to work immediately after being injected. However, most patients will not experience any benefits until 5 to 10 days after the injection, when the swelling has come down to the point where they can tell a difference. Steroids will only help if there is swelling to be treated. As such, they can assist with the diagnosis. If effective, they suggest an inflammatory component to the pain, and if ineffective, they rule out inflammation as the main cause or component of the problem. If the problem is one of mechanical compression, you will get no benefit from those steroids.   In the case of local anesthetics, they have a crucial role in the diagnosis of your condition. Most will begin to work within15 to 20 minutes after injection. The duration will depend on the type used (short- vs. Long-acting). It is of outmost importance that patients keep tract of their pain, after the procedure. To assist with this matter, a "Post-procedure Pain Diary" is provided. Make sure to complete it and to bring it back to your follow-up appointment.  As long as the patient keeps accurate, detailed records of their symptoms after every procedure, and returns to have those interpreted, every procedure will provide [place] with invaluable information. Even a block that does not provide the patient with any relief, will always provide [place] with information about the mechanism and the origin of the pain. The only time a nerve block can be considered a waste of time is when patients do not keep track of the results, or do not keep their post-procedure  appointment.  Reporting the results back to your physician The Pain Score  Pain is a subjective complaint. It cannot be seen, touched, or measured. We depend entirely on the patient's report of the pain in order to assess your condition and treatment. To evaluate the pain, we use a pain scale, where "0" means "No Pain", and a "10" is "the worst possible pain that you can even imagine" (i.e. something like been eaten alive by a shark or being torn apart by a lion).   You will frequently be asked to rate your pain. Please be as accurate, remember that medical decisions will be based on your responses. Please do not rate your pain above a 10. Doing so is actually interpreted as "symptom magnification" (exaggeration), as well as lack of understanding with regards to the scale. To put this into perspective, when you tell us that your pain is at a 10 (ten), what you are saying is that there is nothing we can do to make this pain any worse. (Carefully think about that.)  Call if:  You experience numbness and weakness that gets worse with time, as opposed to wearing off.  New onset bowel or bladder incontinence. (This applies to Spinal procedures only)  Emergency Numbers:  [place] business hours (Monday - Thursday, 8:00 AM - 4:00 PM) (Friday, 9:00 AM - 12:00 Noon): [phone]  After hours: [phone] ____________________________________________________________________________________________

## 2017-10-16 ENCOUNTER — Encounter: Payer: Self-pay | Admitting: Internal Medicine

## 2017-10-16 ENCOUNTER — Telehealth: Payer: Self-pay

## 2017-10-16 ENCOUNTER — Other Ambulatory Visit: Payer: Self-pay | Admitting: Internal Medicine

## 2017-10-16 MED ORDER — ONDANSETRON HCL 4 MG PO TABS: 4.0000 mg | ORAL_TABLET | Freq: Two times a day (BID) | ORAL | 0 refills | Status: DC | PRN

## 2017-10-16 NOTE — Telephone Encounter (Signed)
rx sent in for zofran #14 with no refills.  

## 2017-10-16 NOTE — Telephone Encounter (Signed)
See note.  Please fax prescription.

## 2017-10-16 NOTE — Telephone Encounter (Signed)
Post procedure phone call. Patient states she is doing good.  

## 2017-10-20 DIAGNOSIS — J301 Allergic rhinitis due to pollen: Secondary | ICD-10-CM | POA: Diagnosis not present

## 2017-10-20 DIAGNOSIS — J3089 Other allergic rhinitis: Secondary | ICD-10-CM | POA: Diagnosis not present

## 2017-10-21 DIAGNOSIS — J3089 Other allergic rhinitis: Secondary | ICD-10-CM | POA: Diagnosis not present

## 2017-10-21 DIAGNOSIS — J301 Allergic rhinitis due to pollen: Secondary | ICD-10-CM | POA: Diagnosis not present

## 2017-10-29 DIAGNOSIS — J301 Allergic rhinitis due to pollen: Secondary | ICD-10-CM | POA: Diagnosis not present

## 2017-10-29 DIAGNOSIS — J3089 Other allergic rhinitis: Secondary | ICD-10-CM | POA: Diagnosis not present

## 2017-11-04 ENCOUNTER — Ambulatory Visit: Payer: 59 | Admitting: Pain Medicine

## 2017-11-05 DIAGNOSIS — J3089 Other allergic rhinitis: Secondary | ICD-10-CM | POA: Diagnosis not present

## 2017-11-05 DIAGNOSIS — J301 Allergic rhinitis due to pollen: Secondary | ICD-10-CM | POA: Diagnosis not present

## 2017-11-10 DIAGNOSIS — J301 Allergic rhinitis due to pollen: Secondary | ICD-10-CM | POA: Diagnosis not present

## 2017-11-10 DIAGNOSIS — J3089 Other allergic rhinitis: Secondary | ICD-10-CM | POA: Diagnosis not present

## 2017-11-12 NOTE — Progress Notes (Signed)
Patient's Name: Shannon [name]  MRN: [id number]  Referring Provider: [name], MD  DOB: 1965-06-05  PCP: Shannon Pheasant, MD  DOS: 11/16/2017  Note by: Shannon Cola, MD  Service setting: Ambulatory outpatient  Specialty: Interventional Pain Management  Location: [facility] (AMB) Pain Management Facility    Patient type: Established   Primary Reason(s) for Visit: Encounter for post-procedure evaluation of chronic illness with mild to moderate exacerbation CC: Toe Pain (right, great)  HPI  Shannon [name] is a 52 y.o. year old, female patient, who comes today for a post-procedure evaluation. She has Hyperthyroidism; GERD (gastroesophageal reflux disease); Anemia; Migraine; Menopausal symptoms; Health care maintenance; Anxiety; Gastroesophageal reflux disease without esophagitis; Chronic low back pain (Primary Area of Pain) (Bilateral) (R>L); Lumbar lateral recess stenosis (L4-5) (Left); Lumbar facet hypertrophy; Chronic lumbar radicular pain Shannon [name] source of pain) (intermittent) (L4 Dermatome) (Left); Chronic hip pain (resolved after hip replacement) (Left); Chronic sacroiliac joint pain (Secondary source of pain) (intermittent) (Left); Nicotine dependence; Tobacco abuse; Lumbar spondylosis; Encounter for chronic pain management; Post-menopausal bleeding; Axillary lump; Osteoarthritis of sacroiliac joint (Bilateral) (R>L); History of hip replacement (Left); Osteoarthritis of hip (Left); Chronic sacroiliac joint pain (Right); Chronic myofascial pain syndrome (Right Gluteous Muscle); Chronic pain syndrome; Other specified dorsopathies, sacral and sacrococcygeal region; Urinary urgency; Lumbar Facet syndrome (Right); Spondylosis without myelopathy or radiculopathy, lumbosacral region; Chronic toe pain (Right); Great toe pain (Right); Disorder of skeletal system; Pharmacologic therapy; Problems influencing health status; Osteoarthritis of great toe joint (Right); and Vitamin D insufficiency on their problem  list. Her primarily concern today is the Toe Pain (right, great)  Pain Assessment: Location: Right Toe (Comment which one)(right great) Radiating: denies Onset: More than a month ago Duration: Chronic pain Quality: Throbbing Severity: 0-No pain/10 (subjective, self-reported pain score)  Note: Reported level is compatible with observation.                         When using our objective Pain Scale, levels between 6 and 10/10 are said to belong in an emergency room, as it progressively worsens from a 6/10, described as severely limiting, requiring emergency care not usually available at an outpatient pain management facility. At a 6/10 level, communication becomes difficult and requires great effort. Assistance to reach the emergency department may be required. Facial flushing and profuse sweating along with potentially dangerous increases in heart rate and blood pressure will be evident. Effect on ADL:   Timing: Intermittent Modifying factors: injection BP: (!) 143/93  HR: 80  Shannon [name] comes in today for post-procedure evaluation.  Further details on both, my assessment(s), as well as the proposed treatment plan, please see below.  Post-Procedure Assessment  10/15/2017 Procedure: Diagnostic intra-articular small joint injection 1st MTP #1  under fluoroscopic guidance, no sedation Pre-procedure pain score:  3/10 Post-procedure pain score: 3/10 No initial benefit, possible due to rapid discharge after no sedation procedure, without enough time to allow full onset of block. Influential Factors: BMI: 22.68 kg/m Intra-procedural challenges: None observed.         Assessment challenges: None detected.              Reported side-effects: None.        Post-procedural adverse reactions or complications: None reported         Sedation: No sedation used. When no sedatives are used, the analgesic levels obtained are directly associated to the effectiveness of the local anesthetics. However, when  sedation is provided, the level  of analgesia obtained during the initial 1 hour following the intervention, is believed to be the result of a combination of factors. These factors may include, but are not limited to: 1. The effectiveness of the local anesthetics used. 2. The effects of the analgesic(s) and/or anxiolytic(s) used. 3. The degree of discomfort experienced by the patient at the time of the procedure. 4. The patients ability and reliability in recalling and recording the events. 5. The presence and influence of possible secondary gains and/or psychosocial factors. Reported result: Relief experienced during the 1st hour after the procedure: 75 % (Ultra-Short Term Relief)            Interpretative annotation: Clinically appropriate result. No IV Analgesic or Anxiolytic given, therefore benefits are completely due to Local Anesthetic effects.          Effects of local anesthetic: The analgesic effects attained during this period are directly associated to the localized infiltration of local anesthetics and therefore cary significant diagnostic value as to the etiological location, or anatomical origin, of the pain. Expected duration of relief is directly dependent on the pharmacodynamics of the local anesthetic used. Long-acting (4-6 hours) anesthetics used.  Reported result: Relief during the next 4 to 6 hour after the procedure: 75 % (Short-Term Relief)            Interpretative annotation: Clinically appropriate result. Analgesia during this period is likely to be Local Anesthetic-related.          Long-term benefit: Defined as the period of time past the expected duration of local anesthetics (1 hour for short-acting and 4-6 hours for long-acting). With the possible exception of prolonged sympathetic blockade from the local anesthetics, benefits during this period are typically attributed to, or associated with, other factors such as analgesic sensory neuropraxia, antiinflammatory effects,  or beneficial biochemical changes provided by agents other than the local anesthetics.  Reported result: Extended relief following procedure: 75 % (Long-Term Relief)            Interpretative annotation: Clinically possible results. Good relief. No permanent benefit expected. Inflammation plays a part in the etiology to the pain.          Current benefits: Defined as reported results that persistent at this point in time.   Analgesia: 50-75 %            Function: Somewhat improved ROM: Somewhat improved Interpretative annotation: Ongoing benefit. Incomplete therapeutic success. Effective therapeutic approach.          Interpretation: Results would suggest a successful diagnostic intervention.                  Plan:  At this point, we have decided to wait and see how long of a benefit she gets from this injection.  The patient indicates that she has not seen a podiatrist and therefore we have arranged for her to have a referral for an evaluation and treatment.  I have also placed on a as needed order for this procedure so that she can call for it if she needs it.  Plan was shared with the patient and she has agreed with that.                Laboratory Chemistry  Inflammation Markers (CRP: Acute Phase) (ESR: Chronic Phase) Lab Results  Component Value Date   CRP 2 10/05/2017   ESRSEDRATE 7 10/05/2017  Renal Markers Lab Results  Component Value Date   BUN 14 02/26/2017   CREATININE 0.68 02/26/2017   GFRAA >60 05/02/2014   GFRNONAA >60 05/02/2014                             Hepatic Markers Lab Results  Component Value Date   AST 20 02/26/2017   ALT 20 02/26/2017   ALBUMIN 4.3 02/26/2017                        Neuropathy Markers Lab Results  Component Value Date   VITAMINB12 368 10/05/2017                        Hematology Parameters Lab Results  Component Value Date   PLT 241.0 02/26/2017   HGB 14.4 02/26/2017   HCT 42.5 02/26/2017                         CV Markers Lab Results  Component Value Date   CKTOTAL 65 05/22/2015   TROPONINI <0.03 05/02/2014                         Note: Lab results reviewed.  Recent Imaging Results   Results for orders placed in visit on 10/15/17  DG C-Arm 1-60 Min-No Report   Narrative Fluoroscopy was utilized by the requesting physician.  No radiographic  interpretation.    Interpretation Report: Fluoroscopy was used during the procedure to assist with needle guidance. The images were interpreted intraoperatively by the requesting physician.  Meds   Current Outpatient Medications:  .  acetaminophen (TYLENOL) 500 MG tablet, Take 1,000 mg by mouth every 6 (six) hours as needed for moderate pain., Disp: , Rfl:  .  ALPRAZolam (XANAX) 0.25 MG tablet, TAKE 1 TABLET BY MOUTH AT BEDTIME AS NEEDED FOR ANXIETY, Disp: 30 tablet, Rfl: 1 .  nystatin cream (MYCOSTATIN), Apply 1 application topically 2 (two) times daily., Disp: 30 g, Rfl: 0 .  omeprazole (PRILOSEC) 40 MG capsule, Take 40 mg by mouth daily., Disp: , Rfl:  .  ondansetron (ZOFRAN) 4 MG tablet, Take 1 tablet (4 mg total) by mouth 2 (two) times daily as needed for nausea or vomiting., Disp: 14 tablet, Rfl: 0 .  ranitidine (ZANTAC) 150 MG tablet, Take 150 mg by mouth as needed. , Disp: , Rfl:  .  Calcium Carbonate-Vit D-Min (GNP CALCIUM 1200) 1200-1000 MG-UNIT CHEW, Chew 1,200 mg by mouth daily with breakfast. Take in combination with vitamin D and magnesium., Disp: 30 tablet, Rfl: 5 .  Cholecalciferol (VITAMIN D3) 5000 units CAPS, Take 1 capsule (5,000 Units total) by mouth daily with breakfast. Take along with calcium and magnesium., Disp: 30 capsule, Rfl: 5 .  [START ON 11/19/2017] ergocalciferol (VITAMIN D2) 50000 units capsule, Take 1 capsule (50,000 Units total) by mouth 2 (two) times a week. X 6 weeks., Disp: 12 capsule, Rfl: 0 .  Magnesium 500 MG CAPS, Take 1 capsule (500 mg total) by mouth 2 (two) times daily at 8 am and 10 pm., Disp: 60  capsule, Rfl: 5  ROS  Constitutional: Denies any fever or chills Gastrointestinal: No reported hemesis, hematochezia, vomiting, or acute GI distress Musculoskeletal: Denies any acute onset joint swelling, redness, loss of ROM, or weakness Neurological: No reported episodes of acute onset apraxia, aphasia, dysarthria, agnosia, amnesia, paralysis,  loss of coordination, or loss of consciousness  Allergies  Ms. Deisher is allergic to codeine; decongest-aid [pseudoephedrine]; flagyl [metronidazole]; and sulfa antibiotics.  [place]  Drug: Ms. Sandra  reports that she does not use drugs. Alcohol:  reports that she does not drink alcohol. Tobacco:  reports that she quit smoking about 17 years ago. She has never used smokeless tobacco. Medical:  has a past medical history of Anemia, Anxiety, Breast pain, right (11/07/2012), Breathing difficulty (06/20/2014), Environmental allergies, GERD (gastroesophageal reflux disease), Hiatal hernia, History of migraine headaches, Hyperthyroidism, and Vaginitis (01/16/2013). Surgical: Ms. Billiter  has a past surgical history that includes Septoplasty (5/90); Nasal sinus surgery (11/93); Cesarean section (11/98); Tubal ligation (02/1998); Umbilical hernia repair (02/1998); Cholecystectomy (11/05); Joint replacement (Left, 2017); and Breast biopsy (Right, 12/05/2015). Family: family history includes Breast cancer in her unknown relative; Breast cancer (age of onset: 36) in her mother; Heart disease in her father and maternal grandfather; Hypercholesterolemia in her father and mother; Hypertension in her father; Kidney cancer in her father; Leukemia in her father; Lung cancer in her paternal grandfather; Pancreatic cancer in her paternal grandfather; Prostate cancer in her paternal grandfather; Thyroid disease in her father and mother.  Constitutional Exam  General appearance: Well nourished, well developed, and well hydrated. In no apparent acute distress Vitals:   11/16/17 1142   BP: (!) 143/93  Pulse: 80  Resp: 18  Temp: 98.1 F (36.7 C)  TempSrc: Oral  SpO2: 100%  Weight: 124 lb (56.2 kg)  Height: 5' 2" (1.575 m)   BMI Assessment: Estimated body mass index is 22.68 kg/m as calculated from the following:   Height as of this encounter: 5' 2" (1.575 m).   Weight as of this encounter: 124 lb (56.2 kg).  BMI interpretation table: BMI level Category Range association with higher incidence of chronic pain  <18 kg/m2 Underweight   18.5-24.9 kg/m2 Ideal body weight   25-29.9 kg/m2 Overweight Increased incidence by 20%  30-34.9 kg/m2 Obese (Class I) Increased incidence by 68%  35-39.9 kg/m2 Severe obesity (Class II) Increased incidence by 136%  >40 kg/m2 Extreme obesity (Class III) Increased incidence by 254%   Patient's current BMI Ideal Body weight  Body mass index is 22.68 kg/m. Ideal body weight: 50.1 kg (110 lb 7.2 oz) Adjusted ideal body weight: 52.6 kg (115 lb 13.9 oz)   BMI Readings from Last 4 Encounters:  11/16/17 22.68 kg/m  10/15/17 22.86 kg/m  10/05/17 22.68 kg/m  09/07/17 21.03 kg/m   Wt Readings from Last 4 Encounters:  11/16/17 124 lb (56.2 kg)  10/15/17 125 lb (56.7 kg)  10/05/17 124 lb (56.2 kg)  09/07/17 126 lb 6.4 oz (57.3 kg)  Psych/Mental status: Alert, oriented x 3 (person, place, & time)       Eyes: PERLA Respiratory: No evidence of acute respiratory distress  Cervical Spine Area Exam  Skin & Axial Inspection: No masses, redness, edema, swelling, or associated skin lesions Alignment: Symmetrical Functional ROM: Unrestricted ROM      Stability: No instability detected Muscle Tone/Strength: Functionally intact. No obvious neuro-muscular anomalies detected. Sensory (Neurological): Unimpaired Palpation: No palpable anomalies              Upper Extremity (UE) Exam    Side: Right upper extremity  Side: Left upper extremity  Skin & Extremity Inspection: Skin color, temperature, and hair growth are WNL. No peripheral edema  or cyanosis. No masses, redness, swelling, asymmetry, or associated skin lesions. No contractures.  Skin & Extremity Inspection:  Skin color, temperature, and hair growth are WNL. No peripheral edema or cyanosis. No masses, redness, swelling, asymmetry, or associated skin lesions. No contractures.  Functional ROM: Unrestricted ROM          Functional ROM: Unrestricted ROM          Muscle Tone/Strength: Functionally intact. No obvious neuro-muscular anomalies detected.  Muscle Tone/Strength: Functionally intact. No obvious neuro-muscular anomalies detected.  Sensory (Neurological): Unimpaired          Sensory (Neurological): Unimpaired          Palpation: No palpable anomalies              Palpation: No palpable anomalies              Provocative Test(s):  Phalen's test: deferred Tinel's test: deferred Apley's scratch test (touch opposite shoulder):  Action 1 (Across chest): deferred Action 2 (Overhead): deferred Action 3 (LB reach): deferred   Provocative Test(s):  Phalen's test: deferred Tinel's test: deferred Apley's scratch test (touch opposite shoulder):  Action 1 (Across chest): deferred Action 2 (Overhead): deferred Action 3 (LB reach): deferred    Thoracic Spine Area Exam  Skin & Axial Inspection: No masses, redness, or swelling Alignment: Symmetrical Functional ROM: Unrestricted ROM Stability: No instability detected Muscle Tone/Strength: Functionally intact. No obvious neuro-muscular anomalies detected. Sensory (Neurological): Unimpaired Muscle strength & Tone: No palpable anomalies  Lumbar Spine Area Exam  Skin & Axial Inspection: No masses, redness, or swelling Alignment: Symmetrical Functional ROM: Unrestricted ROM       Stability: No instability detected Muscle Tone/Strength: Functionally intact. No obvious neuro-muscular anomalies detected. Sensory (Neurological): Unimpaired Palpation: No palpable anomalies       Provocative Tests: Hyperextension/rotation test:  deferred today       Lumbar quadrant test (Kemp's test): deferred today       Lateral bending test: deferred today       Patrick's Maneuver: deferred today                   FABER test: deferred today                   S-I anterior distraction/compression test: deferred today         S-I lateral compression test: deferred today         S-I Thigh-thrust test: deferred today         S-I Gaenslen's test: deferred today          Gait & Posture Assessment  Ambulation: Unassisted Gait: Relatively normal for age and body habitus Posture: WNL   Lower Extremity Exam    Side: Right lower extremity  Side: Left lower extremity  Stability: No instability observed          Stability: No instability observed          Skin & Extremity Inspection: Skin color, temperature, and hair growth are WNL. No peripheral edema or cyanosis. No masses, redness, swelling, asymmetry, or associated skin lesions. No contractures.  Skin & Extremity Inspection: Skin color, temperature, and hair growth are WNL. No peripheral edema or cyanosis. No masses, redness, swelling, asymmetry, or associated skin lesions. No contractures.  Functional ROM: Unrestricted ROM                  Functional ROM: Unrestricted ROM                  Muscle Tone/Strength: Functionally intact. No obvious neuro-muscular anomalies detected.  Muscle Tone/Strength: Functionally  intact. No obvious neuro-muscular anomalies detected.  Sensory (Neurological): Unimpaired  Sensory (Neurological): Unimpaired  Palpation: No palpable anomalies  Palpation: No palpable anomalies   Assessment  Primary Diagnosis & Pertinent Problem List: The primary encounter diagnosis was Chronic toe pain (Right). Diagnoses of Great toe pain (Right), Chronic low back pain (Primary Area of Pain) (Bilateral) (R>L), Chronic sacroiliac joint pain (Secondary source of pain) (intermittent) (Left), Chronic lumbar radicular pain (Tertiary source of pain) (intermittent) (L4 Dermatome)  (Left), and Vitamin D insufficiency were also pertinent to this visit.  Status Diagnosis  Controlled Controlled Controlled 1. Chronic toe pain (Right)   2. Great toe pain (Right)   3. Chronic low back pain (Primary Area of Pain) (Bilateral) (R>L)   4. Chronic sacroiliac joint pain (Secondary source of pain) (intermittent) (Left)   5. Chronic lumbar radicular pain [facility] source of pain) (intermittent) (L4 Dermatome) (Left)   6. Vitamin D insufficiency     Problems updated and reviewed during this visit: Problem  Vitamin D Insufficiency   Plan of Care  Pharmacotherapy (Medications Ordered): Meds ordered this encounter  Medications  . ergocalciferol (VITAMIN D2) 50000 units capsule    Sig: Take 1 capsule (50,000 Units total) by mouth 2 (two) times a week. X 6 weeks.    Dispense:  12 capsule    Refill:  0    Do not add this medication to the electronic "Automatic Refill" notification system. Patient may have prescription filled one day early if pharmacy is closed on scheduled refill date.  . Cholecalciferol (VITAMIN D3) 5000 units CAPS    Sig: Take 1 capsule (5,000 Units total) by mouth daily with breakfast. Take along with calcium and magnesium.    Dispense:  30 capsule    Refill:  5    Do not place medication on "Automatic Refill".  May substitute with similar over-the-counter product.  . Magnesium 500 MG CAPS    Sig: Take 1 capsule (500 mg total) by mouth 2 (two) times daily at 8 am and 10 pm.    Dispense:  60 capsule    Refill:  5    Do not place medication on "Automatic Refill".  The patient may use similar over-the-counter product.  . Calcium Carbonate-Vit D-Min (GNP CALCIUM 1200) 1200-1000 MG-UNIT CHEW    Sig: Chew 1,200 mg by mouth daily with breakfast. Take in combination with vitamin D and magnesium.    Dispense:  30 tablet    Refill:  5    Do not place medication on "Automatic Refill".  May substitute with similar over-the-counter product.   Medications administered  today: [name] had no medications administered during this visit.   Procedure Orders     Small Joint Injection/Arthrocentesis (PRN: Palliative right-sided intra-articular small joint injection 1st MTP (Dorsal Metatarsophalangeal ) #2 ) Lab Orders  No laboratory test(s) ordered today   Imaging Orders  No imaging studies ordered today    Referral Orders     Ambulatory referral to Podiatry  -for evaluation and management of right foot first MTP joint pain.  Interventional management options: Planned, scheduled, and/or pending:   None at this time.   Considering:   Diagnosticright lumbar facet block #2 Possible right-sided lumbar facet RFA#1 Diagnostic right-sided sacroiliac joint block#4 Possible right-sided sacroiliac joint RFA#1 Palliative right-sided L4-5LESI#2 Palliative left-sided L4-5 LESI #2 Palliative left-sided sacroiliac joint block#2 Possibleleft-sided sacroiliac joint RFA#1 Diagnostic right great toe intra-articular injection #2    Palliative PRN treatment(s):   Palliative right-sided intra-articular small joint  injection 1st MTP (Dorsal Metatarsophalangeal ) #2  Palliativeright gluteal MNB Diagnostic right-sided sacroiliac joint block Palliative right-sided L4-5 lumbar epidural steroid injection Palliative left-sided sacroiliac joint block Palliative right-sided lumbar facet blocks Diagnostic right great toe intra-articular injection(s)    Provider-requested follow-up: Return for PRN Procedure.  Future Appointments  Date Time Provider [place]  12/03/2017  2:40 PM [facility]  03/08/2018  1:30 PM Shannon Pheasant, MD [id number]   Primary Care Physician: Shannon Pheasant, MD Location: [facility] Management Facility Note by: Shannon Cola, MD Date: 11/16/2017; Time: 12:18 PM

## 2017-11-16 ENCOUNTER — Ambulatory Visit: Payer: Commercial Managed Care - HMO | Attending: Pain Medicine | Admitting: Pain Medicine

## 2017-11-16 ENCOUNTER — Other Ambulatory Visit: Payer: Self-pay

## 2017-11-16 ENCOUNTER — Encounter: Payer: Self-pay | Admitting: Pain Medicine

## 2017-11-16 VITALS — BP 143/93 | HR 80 | Temp 98.1°F | Resp 18 | Ht 62.0 in | Wt 124.0 lb

## 2017-11-16 DIAGNOSIS — Z885 Allergy status to narcotic agent status: Secondary | ICD-10-CM | POA: Insufficient documentation

## 2017-11-16 DIAGNOSIS — Z87891 Personal history of nicotine dependence: Secondary | ICD-10-CM | POA: Diagnosis not present

## 2017-11-16 DIAGNOSIS — K449 Diaphragmatic hernia without obstruction or gangrene: Secondary | ICD-10-CM | POA: Diagnosis not present

## 2017-11-16 DIAGNOSIS — N950 Postmenopausal bleeding: Secondary | ICD-10-CM | POA: Diagnosis not present

## 2017-11-16 DIAGNOSIS — G894 Chronic pain syndrome: Secondary | ICD-10-CM | POA: Insufficient documentation

## 2017-11-16 DIAGNOSIS — M8938 Hypertrophy of bone, other site: Secondary | ICD-10-CM | POA: Diagnosis not present

## 2017-11-16 DIAGNOSIS — Z9049 Acquired absence of other specified parts of digestive tract: Secondary | ICD-10-CM | POA: Diagnosis not present

## 2017-11-16 DIAGNOSIS — M79674 Pain in right toe(s): Secondary | ICD-10-CM

## 2017-11-16 DIAGNOSIS — M4726 Other spondylosis with radiculopathy, lumbar region: Secondary | ICD-10-CM | POA: Insufficient documentation

## 2017-11-16 DIAGNOSIS — Z96642 Presence of left artificial hip joint: Secondary | ICD-10-CM | POA: Diagnosis not present

## 2017-11-16 DIAGNOSIS — Z8249 Family history of ischemic heart disease and other diseases of the circulatory system: Secondary | ICD-10-CM | POA: Insufficient documentation

## 2017-11-16 DIAGNOSIS — F419 Anxiety disorder, unspecified: Secondary | ICD-10-CM | POA: Diagnosis not present

## 2017-11-16 DIAGNOSIS — Z882 Allergy status to sulfonamides status: Secondary | ICD-10-CM | POA: Insufficient documentation

## 2017-11-16 DIAGNOSIS — K219 Gastro-esophageal reflux disease without esophagitis: Secondary | ICD-10-CM | POA: Insufficient documentation

## 2017-11-16 DIAGNOSIS — M533 Sacrococcygeal disorders, not elsewhere classified: Secondary | ICD-10-CM | POA: Insufficient documentation

## 2017-11-16 DIAGNOSIS — Z5181 Encounter for therapeutic drug level monitoring: Secondary | ICD-10-CM | POA: Diagnosis not present

## 2017-11-16 DIAGNOSIS — D649 Anemia, unspecified: Secondary | ICD-10-CM | POA: Insufficient documentation

## 2017-11-16 DIAGNOSIS — M5416 Radiculopathy, lumbar region: Secondary | ICD-10-CM

## 2017-11-16 DIAGNOSIS — M545 Low back pain, unspecified: Secondary | ICD-10-CM

## 2017-11-16 DIAGNOSIS — Z79899 Other long term (current) drug therapy: Secondary | ICD-10-CM | POA: Insufficient documentation

## 2017-11-16 DIAGNOSIS — G43909 Migraine, unspecified, not intractable, without status migrainosus: Secondary | ICD-10-CM | POA: Insufficient documentation

## 2017-11-16 DIAGNOSIS — E559 Vitamin D deficiency, unspecified: Secondary | ICD-10-CM | POA: Diagnosis not present

## 2017-11-16 DIAGNOSIS — Z881 Allergy status to other antibiotic agents status: Secondary | ICD-10-CM | POA: Insufficient documentation

## 2017-11-16 DIAGNOSIS — M19071 Primary osteoarthritis, right ankle and foot: Secondary | ICD-10-CM | POA: Diagnosis not present

## 2017-11-16 DIAGNOSIS — M48061 Spinal stenosis, lumbar region without neurogenic claudication: Secondary | ICD-10-CM | POA: Diagnosis not present

## 2017-11-16 DIAGNOSIS — E0590 Thyrotoxicosis, unspecified without thyrotoxic crisis or storm: Secondary | ICD-10-CM | POA: Diagnosis not present

## 2017-11-16 DIAGNOSIS — G8929 Other chronic pain: Secondary | ICD-10-CM

## 2017-11-16 NOTE — Patient Instructions (Signed)
____________________________________________________________________________________________  Preparing for your procedure (without sedation)  Instructions: . Oral Intake: Do not eat or drink anything for at least 3 hours prior to your procedure. . Transportation: Unless otherwise stated by your physician, you may drive yourself after the procedure. . Blood Pressure Medicine: Take your blood pressure medicine with a sip of water the morning of the procedure. . Blood thinners: Notify our staff if you are taking any blood thinners. Depending on which one you take, there will be specific instructions on how and when to stop it. . Diabetics on insulin: Notify the staff so that you can be scheduled 1st case in the morning. If your diabetes requires high dose insulin, take only  of your normal insulin dose the morning of the procedure and notify the staff that you have done so. . Preventing infections: Shower with an antibacterial soap the morning of your procedure.  . Build-up your immune system: Take 1000 mg of Vitamin C with every meal (3 times a day) the day prior to your procedure. Marland Kitchen Antibiotics: Inform the staff if you have a condition or reason that requires you to take antibiotics before dental procedures. . Pregnancy: If you are pregnant, call and cancel the procedure. . Sickness: If you have a cold, fever, or any active infections, call and cancel the procedure. . Arrival: You must be in the facility at least 30 minutes prior to your scheduled procedure. . Children: Do not bring any children with you. . Dress appropriately: Bring dark clothing that you would not mind if they get stained. . Valuables: Do not bring any jewelry or valuables.  Procedure appointments are reserved for interventional treatments only. Marland Kitchen No Prescription Refills. . No medication changes will be discussed during procedure appointments. . No disability issues will be discussed.  Reasons to call and reschedule or  cancel your procedure: (Following these recommendations will minimize the risk of a serious complication.) . Surgeries: Avoid having procedures within 2 weeks of any surgery. (Avoid for 2 weeks before or after any surgery). . Flu Shots: Avoid having procedures within 2 weeks of a flu shots or . (Avoid for 2 weeks before or after immunizations). . Barium: Avoid having a procedure within 7-10 days after having had a radiological study involving the use of radiological contrast. (Myelograms, Barium swallow or enema study). . Heart attacks: Avoid any elective procedures or surgeries for the initial 6 months after a "Myocardial Infarction" (Heart Attack). . Blood thinners: It is imperative that you stop these medications before procedures. Let us know if you if you take any blood thinner.  . Infection: Avoid procedures during or within two weeks of an infection (including chest colds or gastrointestinal problems). Symptoms associated with infections include: Localized redness, fever, chills, night sweats or profuse sweating, burning sensation when voiding, cough, congestion, stuffiness, runny nose, sore throat, diarrhea, nausea, vomiting, cold or Flu symptoms, recent or current infections. It is specially important if the infection is over the area that we intend to treat. Marland Kitchen Heart and lung problems: Symptoms that may suggest an active cardiopulmonary problem include: cough, chest pain, breathing difficulties or shortness of breath, dizziness, ankle swelling, uncontrolled high or unusually low blood pressure, and/or palpitations. If you are experiencing any of these symptoms, cancel your procedure and contact your primary care physician for an evaluation.  Remember:  Regular Business hours are:  Monday to Thursday 8:00 AM to 4:00 PM  Provider's Schedule: Milinda Pointer, MD:  Procedure days: Tuesday and Thursday 7:30 AM to  4:00 PM  Bilal Lateef, MD:  Procedure days: Monday and Wednesday 7:30 AM to 4:00  PM ____________________________________________________________________________________________    

## 2017-11-16 NOTE — Progress Notes (Signed)
Safety precautions to be maintained throughout the outpatient stay will include: orient to surroundings, keep bed in low position, maintain call bell within reach at all times, provide assistance with transfer out of bed and ambulation.  

## 2017-11-17 DIAGNOSIS — J301 Allergic rhinitis due to pollen: Secondary | ICD-10-CM | POA: Diagnosis not present

## 2017-11-17 DIAGNOSIS — E559 Vitamin D deficiency, unspecified: Secondary | ICD-10-CM | POA: Insufficient documentation

## 2017-11-17 DIAGNOSIS — J3089 Other allergic rhinitis: Secondary | ICD-10-CM | POA: Diagnosis not present

## 2017-11-17 MED ORDER — MAGNESIUM 500 MG PO CAPS: 500.0000 mg | ORAL_CAPSULE | Freq: Two times a day (BID) | ORAL | 5 refills | Status: DC

## 2017-11-17 MED ORDER — ERGOCALCIFEROL 1.25 MG (50000 UT) PO CAPS: 50000.0000 [IU] | ORAL_CAPSULE | ORAL | 0 refills | Status: AC

## 2017-11-17 MED ORDER — GNP CALCIUM 1200 1200-1000 MG-UNIT PO CHEW: 1200.0000 mg | CHEWABLE_TABLET | Freq: Every day | ORAL | 5 refills | Status: DC

## 2017-11-17 MED ORDER — VITAMIN D3 125 MCG (5000 UT) PO CAPS: 1.0000 | ORAL_CAPSULE | Freq: Every day | ORAL | 5 refills | Status: DC

## 2017-11-26 DIAGNOSIS — J3089 Other allergic rhinitis: Secondary | ICD-10-CM | POA: Diagnosis not present

## 2017-11-26 DIAGNOSIS — J301 Allergic rhinitis due to pollen: Secondary | ICD-10-CM | POA: Diagnosis not present

## 2017-12-03 ENCOUNTER — Ambulatory Visit
Admission: RE | Admit: 2017-12-03 | Discharge: 2017-12-03 | Disposition: A | Discharge status: Home / Self Care | Payer: 59 | Source: Ambulatory Visit | Attending: Internal Medicine | Admitting: Internal Medicine

## 2017-12-03 DIAGNOSIS — J301 Allergic rhinitis due to pollen: Secondary | ICD-10-CM | POA: Diagnosis not present

## 2017-12-03 DIAGNOSIS — Z1231 Encounter for screening mammogram for malignant neoplasm of breast: Secondary | ICD-10-CM | POA: Diagnosis not present

## 2017-12-03 DIAGNOSIS — J3089 Other allergic rhinitis: Secondary | ICD-10-CM | POA: Diagnosis not present

## 2017-12-03 DIAGNOSIS — Z1239 Encounter for other screening for malignant neoplasm of breast: Secondary | ICD-10-CM | POA: Diagnosis not present

## 2017-12-07 ENCOUNTER — Encounter: Payer: Self-pay | Admitting: Podiatry

## 2017-12-07 ENCOUNTER — Ambulatory Visit: Payer: 59 | Admitting: Podiatry

## 2017-12-07 ENCOUNTER — Ambulatory Visit: Payer: Self-pay

## 2017-12-07 VITALS — BP 121/67 | HR 70 | Resp 16

## 2017-12-07 DIAGNOSIS — M7751 Other enthesopathy of right foot: Secondary | ICD-10-CM

## 2017-12-08 DIAGNOSIS — J3089 Other allergic rhinitis: Secondary | ICD-10-CM | POA: Diagnosis not present

## 2017-12-08 DIAGNOSIS — J301 Allergic rhinitis due to pollen: Secondary | ICD-10-CM | POA: Diagnosis not present

## 2017-12-08 NOTE — Progress Notes (Signed)
Subjective:  Patient ID: Shannon [name], female    DOB: 29-Jul-1965,  MRN: [id number] HPI Chief Complaint  Patient presents with  . Foot Pain    1st MPJ right - aching x several months, [facility] injected in Feb 2019, pain clinic injected again early October, still having pain- "they took xrays and said it was bone on bone", dress shoes and walking a lot makes worse, Ortho gave carbon fiber insole-helps but foot slides in shoe   . New Patient (Initial Visit)    52 y.o. female presents with the above complaint.   ROS: Denies fever chills nausea vomiting muscle aches pains calf pain back pain chest pain shortness of breath.  Past Medical History:  Diagnosis Date  . Anemia   . Anxiety   . Breast pain, right 11/07/2012  . Breathing difficulty 06/20/2014  . Environmental allergies   . GERD (gastroesophageal reflux disease)   . Hiatal hernia    small  . History of migraine headaches   . Hyperthyroidism    s/p ablation  . Vaginitis 01/16/2013   Past Surgical History:  Procedure Laterality Date  . BREAST BIOPSY Right 12/05/2015   neg  . CESAREAN SECTION  11/98   Dr Roena Malady  . CHOLECYSTECTOMY  11/05   Dr Pat Patrick  . JOINT REPLACEMENT Left 2017   hip  . NASAL SINUS SURGERY  11/93  . SEPTOPLASTY  5/90   Dr Rossie Muskrat  . TUBAL LIGATION  02/1998  . UMBILICAL HERNIA REPAIR  02/1998    Current Outpatient Medications:  .  acetaminophen (TYLENOL) 500 MG tablet, Take 1,000 mg by mouth every 6 (six) hours as needed for moderate pain., Disp: , Rfl:  .  ALPRAZolam (XANAX) 0.25 MG tablet, TAKE 1 TABLET BY MOUTH AT BEDTIME AS NEEDED FOR ANXIETY, Disp: 30 tablet, Rfl: 1 .  amoxicillin (AMOXIL) 500 MG capsule, , Disp: , Rfl:  .  Calcium Carbonate-Vit D-Min (GNP CALCIUM 1200) 1200-1000 MG-UNIT CHEW, Chew 1,200 mg by mouth daily with breakfast. Take in combination with vitamin D and magnesium., Disp: 30 tablet, Rfl: 5 .  Cholecalciferol (VITAMIN D3) 5000 units CAPS, Take 1 capsule (5,000 Units  total) by mouth daily with breakfast. Take along with calcium and magnesium., Disp: 30 capsule, Rfl: 5 .  ergocalciferol (VITAMIN D2) 50000 units capsule, Take 1 capsule (50,000 Units total) by mouth 2 (two) times a week. X 6 weeks., Disp: 12 capsule, Rfl: 0 .  Magnesium 500 MG CAPS, Take 1 capsule (500 mg total) by mouth 2 (two) times daily at 8 am and 10 pm., Disp: 60 capsule, Rfl: 5 .  nystatin cream (MYCOSTATIN), Apply 1 application topically 2 (two) times daily., Disp: 30 g, Rfl: 0 .  omeprazole (PRILOSEC) 40 MG capsule, Take 40 mg by mouth daily., Disp: , Rfl:  .  ondansetron (ZOFRAN) 4 MG tablet, Take 1 tablet (4 mg total) by mouth 2 (two) times daily as needed for nausea or vomiting., Disp: 14 tablet, Rfl: 0 .  ranitidine (ZANTAC) 150 MG tablet, Take 150 mg by mouth as needed. , Disp: , Rfl:   Allergies  Allergen Reactions  . Codeine Other (See Comments)    Severe Headache  . Decongest-Aid [Pseudoephedrine] Other (See Comments)    Increased heart rate  . Flagyl [Metronidazole]   . Sulfa Antibiotics    Review of Systems Objective:   Vitals:   12/07/17 1338  BP: 121/67  Pulse: 70  Resp: 16    General: Well developed, nourished,  in no acute distress, alert and oriented x3   Dermatological: Skin is warm, dry and supple bilateral. Nails x 10 are well maintained; remaining integument appears unremarkable at this time. There are no open sores, no preulcerative lesions, no rash or signs of infection present.  Vascular: Dorsalis Pedis artery and Posterior Tibial artery pedal pulses are 2/4 bilateral with immedate capillary fill time. Pedal hair growth present. No varicosities and no lower extremity edema present bilateral.   Neruologic: Grossly intact via light touch bilateral. Vibratory intact via tuning fork bilateral. Protective threshold with Semmes Wienstein monofilament intact to all pedal sites bilateral. Patellar and Achilles deep tendon reflexes 2+ bilateral. No Babinski or  clonus noted bilateral.   Musculoskeletal: No gross boney pedal deformities bilateral. No pain, crepitus, or limitation noted with foot and ankle range of motion bilateral. Muscular strength 5/5 in all groups tested bilateral.  Gait: Unassisted, Nonantalgic.    Radiographs:  Radiographs which were brought with her today were reviewed demonstrating osteoarthritic changes bone to bone contact joint space narrowing subchondral sclerosis and dorsal spurring of the first metatarsophalangeal joint of the right foot.  No other acute findings are noted radiographs were dated February 2019.  Assessment & Plan:   Assessment: Hallux limitus with osteoarthritic change first metatarsophalangeal joint of the right foot.  Plan: She will follow-up with me on an as-needed basis for corticosteroid injection she just recently had one about a month ago from her pain doctor she states that is now starting to feel about 50% better than it was so I recommended that she not follow-up with [place] for another injection until at least 4 to 6 months.  She also had a discussion very thorough discussion today about surgical intervention consisting of a Keller arthroplasty with single silicone implant she understands this and is amenable to it if necessary.     Carlyann Placide T. [place], [place]

## 2017-12-13 ENCOUNTER — Encounter: Payer: Self-pay | Admitting: Internal Medicine

## 2017-12-14 MED ORDER — ALPRAZOLAM 0.25 MG PO TABS: ORAL_TABLET | ORAL | 1 refills | Status: DC

## 2017-12-14 NOTE — Telephone Encounter (Signed)
rx ok'd for xanax #30 with one refill  

## 2017-12-15 DIAGNOSIS — J3089 Other allergic rhinitis: Secondary | ICD-10-CM | POA: Diagnosis not present

## 2017-12-15 DIAGNOSIS — J301 Allergic rhinitis due to pollen: Secondary | ICD-10-CM | POA: Diagnosis not present

## 2017-12-22 DIAGNOSIS — J301 Allergic rhinitis due to pollen: Secondary | ICD-10-CM | POA: Diagnosis not present

## 2017-12-22 DIAGNOSIS — J3089 Other allergic rhinitis: Secondary | ICD-10-CM | POA: Diagnosis not present

## 2017-12-29 DIAGNOSIS — J301 Allergic rhinitis due to pollen: Secondary | ICD-10-CM | POA: Diagnosis not present

## 2017-12-29 DIAGNOSIS — J3089 Other allergic rhinitis: Secondary | ICD-10-CM | POA: Diagnosis not present

## 2018-01-07 DIAGNOSIS — J301 Allergic rhinitis due to pollen: Secondary | ICD-10-CM | POA: Diagnosis not present

## 2018-01-07 DIAGNOSIS — J3089 Other allergic rhinitis: Secondary | ICD-10-CM | POA: Diagnosis not present

## 2018-01-14 ENCOUNTER — Other Ambulatory Visit: Payer: Self-pay | Admitting: Internal Medicine

## 2018-01-14 DIAGNOSIS — J301 Allergic rhinitis due to pollen: Secondary | ICD-10-CM | POA: Diagnosis not present

## 2018-01-14 DIAGNOSIS — J3089 Other allergic rhinitis: Secondary | ICD-10-CM | POA: Diagnosis not present

## 2018-01-14 NOTE — Telephone Encounter (Signed)
ok'd rx for alprazolam #30 with one refill.   

## 2018-01-22 DIAGNOSIS — J301 Allergic rhinitis due to pollen: Secondary | ICD-10-CM | POA: Diagnosis not present

## 2018-01-22 DIAGNOSIS — J3089 Other allergic rhinitis: Secondary | ICD-10-CM | POA: Diagnosis not present

## 2018-01-26 DIAGNOSIS — J301 Allergic rhinitis due to pollen: Secondary | ICD-10-CM | POA: Diagnosis not present

## 2018-01-26 DIAGNOSIS — J3089 Other allergic rhinitis: Secondary | ICD-10-CM | POA: Diagnosis not present

## 2018-02-04 DIAGNOSIS — J301 Allergic rhinitis due to pollen: Secondary | ICD-10-CM | POA: Diagnosis not present

## 2018-02-04 DIAGNOSIS — J3089 Other allergic rhinitis: Secondary | ICD-10-CM | POA: Diagnosis not present

## 2018-02-04 DIAGNOSIS — H1045 Other chronic allergic conjunctivitis: Secondary | ICD-10-CM | POA: Diagnosis not present

## 2018-02-04 DIAGNOSIS — R05 Cough: Secondary | ICD-10-CM | POA: Diagnosis not present

## 2018-02-08 DIAGNOSIS — J301 Allergic rhinitis due to pollen: Secondary | ICD-10-CM | POA: Diagnosis not present

## 2018-02-09 DIAGNOSIS — J301 Allergic rhinitis due to pollen: Secondary | ICD-10-CM | POA: Diagnosis not present

## 2018-02-09 DIAGNOSIS — J3089 Other allergic rhinitis: Secondary | ICD-10-CM | POA: Diagnosis not present

## 2018-02-11 ENCOUNTER — Encounter: Payer: Self-pay | Admitting: Internal Medicine

## 2018-02-11 DIAGNOSIS — K219 Gastro-esophageal reflux disease without esophagitis: Secondary | ICD-10-CM | POA: Diagnosis not present

## 2018-02-11 NOTE — Telephone Encounter (Signed)
Last OV 09/07/17 Next OV 03/10/18 Last refill

## 2018-02-12 ENCOUNTER — Other Ambulatory Visit: Payer: Self-pay | Admitting: Internal Medicine

## 2018-02-13 NOTE — Telephone Encounter (Signed)
rx ok'd for xanax #30 with 1 refill.   

## 2018-02-15 DIAGNOSIS — G43019 Migraine without aura, intractable, without status migrainosus: Secondary | ICD-10-CM | POA: Diagnosis not present

## 2018-02-16 DIAGNOSIS — J3089 Other allergic rhinitis: Secondary | ICD-10-CM | POA: Diagnosis not present

## 2018-02-16 DIAGNOSIS — J301 Allergic rhinitis due to pollen: Secondary | ICD-10-CM | POA: Diagnosis not present

## 2018-02-23 DIAGNOSIS — J3089 Other allergic rhinitis: Secondary | ICD-10-CM | POA: Diagnosis not present

## 2018-02-23 DIAGNOSIS — J301 Allergic rhinitis due to pollen: Secondary | ICD-10-CM | POA: Diagnosis not present

## 2018-03-04 DIAGNOSIS — J301 Allergic rhinitis due to pollen: Secondary | ICD-10-CM | POA: Diagnosis not present

## 2018-03-04 DIAGNOSIS — J3089 Other allergic rhinitis: Secondary | ICD-10-CM | POA: Diagnosis not present

## 2018-03-08 ENCOUNTER — Ambulatory Visit (INDEPENDENT_AMBULATORY_CARE_PROVIDER_SITE_OTHER): Payer: 59 | Admitting: Internal Medicine

## 2018-03-08 ENCOUNTER — Other Ambulatory Visit (HOSPITAL_COMMUNITY)
Admission: RE | Admit: 2018-03-08 | Discharge: 2018-03-08 | Disposition: A | Discharge status: Home / Self Care | Payer: 59 | Source: Ambulatory Visit | Attending: Internal Medicine | Admitting: Internal Medicine

## 2018-03-08 ENCOUNTER — Encounter: Payer: Self-pay | Admitting: Internal Medicine

## 2018-03-08 VITALS — BP 110/70 | HR 87 | Temp 98.4°F | Wt 127.8 lb

## 2018-03-08 DIAGNOSIS — Z124 Encounter for screening for malignant neoplasm of cervix: Secondary | ICD-10-CM

## 2018-03-08 DIAGNOSIS — K219 Gastro-esophageal reflux disease without esophagitis: Secondary | ICD-10-CM

## 2018-03-08 DIAGNOSIS — D649 Anemia, unspecified: Secondary | ICD-10-CM

## 2018-03-08 DIAGNOSIS — F419 Anxiety disorder, unspecified: Secondary | ICD-10-CM

## 2018-03-08 DIAGNOSIS — Z0000 Encounter for general adult medical examination without abnormal findings: Secondary | ICD-10-CM

## 2018-03-08 DIAGNOSIS — L299 Pruritus, unspecified: Secondary | ICD-10-CM

## 2018-03-08 DIAGNOSIS — M48061 Spinal stenosis, lumbar region without neurogenic claudication: Secondary | ICD-10-CM

## 2018-03-08 DIAGNOSIS — E0590 Thyrotoxicosis, unspecified without thyrotoxic crisis or storm: Secondary | ICD-10-CM | POA: Diagnosis not present

## 2018-03-08 DIAGNOSIS — E559 Vitamin D deficiency, unspecified: Secondary | ICD-10-CM

## 2018-03-08 LAB — BASIC METABOLIC PANEL
BUN: 12 mg/dL (ref 6–23)
CO2: 28 mEq/L (ref 19–32)
Calcium: 9.6 mg/dL (ref 8.4–10.5)
Chloride: 101 mEq/L (ref 96–112)
Creatinine, Ser: 0.73 mg/dL (ref 0.40–1.20)
GFR: 82.93 mL/min (ref >60.00)
Glucose, Bld: 73 mg/dL (ref 70–99)
POTASSIUM: 4.1 meq/L (ref 3.5–5.1)
SODIUM: 138 meq/L (ref 135–145)

## 2018-03-08 LAB — CBC WITH DIFFERENTIAL/PLATELET
BASOS PCT: 0.9 % (ref 0.0–3.0)
Basophils Absolute: 0.1 10*3/uL (ref 0.0–0.1)
Eosinophils Absolute: 0.2 10*3/uL (ref 0.0–0.7)
Eosinophils Relative: 2.5 % (ref 0.0–5.0)
HEMATOCRIT: 39.4 % (ref 36.0–46.0)
Hemoglobin: 13.4 g/dL (ref 12.0–15.0)
LYMPHS ABS: 2.1 10*3/uL (ref 0.7–4.0)
LYMPHS PCT: 31.7 % (ref 12.0–46.0)
MCHC: 33.9 g/dL (ref 30.0–36.0)
MCV: 90.7 fl (ref 78.0–100.0)
MONOS PCT: 6.9 % (ref 3.0–12.0)
Monocytes Absolute: 0.5 10*3/uL (ref 0.1–1.0)
NEUTROS ABS: 3.8 10*3/uL (ref 1.4–7.7)
NEUTROS PCT: 58 % (ref 43.0–77.0)
PLATELETS: 253 10*3/uL (ref 150.0–400.0)
RBC: 4.35 Mil/uL (ref 3.87–5.11)
RDW: 13 % (ref 11.5–15.5)
WBC: 6.5 10*3/uL (ref 4.0–10.5)

## 2018-03-08 LAB — HEPATIC FUNCTION PANEL
ALBUMIN: 4.3 g/dL (ref 3.5–5.2)
ALT: 26 U/L (ref 0–35)
AST: 25 U/L (ref 0–37)
Alkaline Phosphatase: 60 U/L (ref 39–117)
Bilirubin, Direct: 0.1 mg/dL (ref 0.0–0.3)
TOTAL PROTEIN: 6.8 g/dL (ref 6.0–8.3)
Total Bilirubin: 0.3 mg/dL (ref 0.2–1.2)

## 2018-03-08 LAB — TSH: TSH: 1.36 u[IU]/mL (ref 0.35–4.50)

## 2018-03-08 NOTE — Assessment & Plan Note (Signed)
Physical today 03/08/18.  PAP 03/02/17 - negative with negative HPV.  Colonoscopy 11/2014.  Recommended f/u in 11/2024.

## 2018-03-08 NOTE — Assessment & Plan Note (Signed)
Takes xanax.  Stable.  

## 2018-03-08 NOTE — Assessment & Plan Note (Signed)
Follow cbc.  

## 2018-03-08 NOTE — Assessment & Plan Note (Signed)
S/p ablation.  Follow tsh.

## 2018-03-08 NOTE — Assessment & Plan Note (Signed)
Off supplements.  Follow.

## 2018-03-08 NOTE — Progress Notes (Signed)
Patient ID: Shannon [name], female   DOB: May 11, 1965, 53 y.o.   MRN: [id number]   Subjective:    Patient ID: Shannon [name], female    DOB: [birth year], 53 y.o.   MRN: [id number]  HPI  Patient here for her physical exam.  She reports she is doing relatively well.  Some increased stress recently with sick family members.  Overall coping relatively well.  Did start itching several days ago.  No rash.  No new contacts or exposures.  Just itching.  No fever.  Took benadryl.  Helped.  No chest pain.  No sob.  No acid reflux.  No abdominal pain.  Bowels moving.  No urine change.  On prilosec.  Controlling acid reflux.     Past Medical History:  Diagnosis Date  . Anemia   . Anxiety   . Breast pain, right 11/07/2012  . Breathing difficulty 06/20/2014  . Environmental allergies   . GERD (gastroesophageal reflux disease)   . Hiatal hernia    small  . History of migraine headaches   . Hyperthyroidism    s/p ablation  . Vaginitis 01/16/2013   Past Surgical History:  Procedure Laterality Date  . BREAST BIOPSY Right 12/05/2015   neg  . CESAREAN SECTION  11/98   Dr Roena Malady  . CHOLECYSTECTOMY  11/05   Dr Pat Patrick  . JOINT REPLACEMENT Left 2017   hip  . NASAL SINUS SURGERY  11/93  . SEPTOPLASTY  5/90   Dr Rossie Muskrat  . TUBAL LIGATION  02/1998  . UMBILICAL HERNIA REPAIR  02/1998   Family History  Problem Relation Age of Onset  . Heart disease Father   . Hypertension Father   . Thyroid disease Father   . Hypercholesterolemia Father   . Kidney cancer Father   . Leukemia Father        hairy cell  . Hypercholesterolemia Mother   . Thyroid disease Mother   . Breast cancer Mother 23  . Breast cancer Other        maternal great grandmother  . Heart disease Maternal Grandfather        MI - age 18  . Prostate cancer Paternal Grandfather   . Pancreatic cancer Paternal Grandfather   . Lung cancer Paternal Grandfather   . Bladder Cancer Neg Hx    Social History   Socioeconomic History  .  Marital status: Married    Spouse name: Not on file  . Number of children: 1  . Years of education: Not on file  . Highest education level: Not on file  Occupational History  . Not on file  Social Needs  . Financial resource strain: Not on file  . Food insecurity:    Worry: Not on file    Inability: Not on file  . Transportation needs:    Medical: Not on file    Non-medical: Not on file  Tobacco Use  . Smoking status: Former Smoker    Last attempt to quit: 01/28/2000    Years since quitting: 18.1  . Smokeless tobacco: Never Used  Substance and Sexual Activity  . Alcohol use: No    Alcohol/week: 0.0 standard drinks  . Drug use: No  . Sexual activity: Not on file  Lifestyle  . Physical activity:    Days per week: Not on file    Minutes per session: Not on file  . Stress: Not on file  Relationships  . Social connections:    Talks on phone: Not on  file    Gets together: Not on file    Attends religious service: Not on file    Active member of club or organization: Not on file    Attends meetings of clubs or organizations: Not on file    Relationship status: Not on file  Other Topics Concern  . Not on file  Social History Narrative  . Not on file    Outpatient Encounter Medications as of 03/08/2018  Medication Sig  . acetaminophen (TYLENOL) 500 MG tablet Take 1,000 mg by mouth every 6 (six) hours as needed for moderate pain.  Marland Kitchen ALPRAZolam (XANAX) 0.25 MG tablet TAKE ONE TABLET AT BEDTIME AS NEEDED FORANXIETY  . amoxicillin (AMOXIL) 500 MG capsule   . EPINEPHrine 0.3 mg/0.3 mL IJ SOAJ injection   . famotidine (PEPCID) 40 MG tablet Take by mouth.  Marland Kitchen omeprazole (PRILOSEC) 40 MG capsule Take 40 mg by mouth daily.  . ondansetron (ZOFRAN) 4 MG tablet Take 1 tablet (4 mg total) by mouth 2 (two) times daily as needed for nausea or vomiting.  . SUMAtriptan (IMITREX) 100 MG tablet   . Calcium Carbonate-Vit D-Min (GNP CALCIUM 1200) 1200-1000 MG-UNIT CHEW Chew 1,200 mg by mouth  daily with breakfast. Take in combination with vitamin D and magnesium. (Patient not taking: Reported on 03/08/2018)  . Cholecalciferol (VITAMIN D3) 5000 units CAPS Take 1 capsule (5,000 Units total) by mouth daily with breakfast. Take along with calcium and magnesium. (Patient not taking: Reported on 03/08/2018)  . Magnesium 500 MG CAPS Take 1 capsule (500 mg total) by mouth 2 (two) times daily at 8 am and 10 pm. (Patient not taking: Reported on 03/08/2018)  . nystatin cream (MYCOSTATIN) Apply 1 application topically 2 (two) times daily. (Patient not taking: Reported on 03/08/2018)  . ranitidine (ZANTAC) 150 MG tablet Take 150 mg by mouth as needed.    No facility-administered encounter medications on file as of 03/08/2018.     Review of Systems  Constitutional: Negative for appetite change, fever and unexpected weight change.  HENT: Negative for congestion and sinus pressure.   Eyes: Negative for pain and visual disturbance.  Respiratory: Negative for cough, chest tightness and shortness of breath.   Cardiovascular: Negative for chest pain, palpitations and leg swelling.  Gastrointestinal: Negative for abdominal pain, diarrhea, nausea and vomiting.  Genitourinary: Negative for difficulty urinating and dysuria.  Musculoskeletal: Negative for joint swelling and myalgias.  Skin: Negative for color change and rash.       Itching.    Neurological: Negative for dizziness, light-headedness and headaches.  Hematological: Negative for adenopathy. Does not bruise/bleed easily.  Psychiatric/Behavioral: Negative for agitation and dysphoric mood.       Objective:    Physical Exam Constitutional:      General: She is not in acute distress.    Appearance: Normal appearance. She is well-developed.  HENT:     Nose: Nose normal. No congestion.     Mouth/Throat:     Pharynx: No oropharyngeal exudate or posterior oropharyngeal erythema.  Eyes:     General: No scleral icterus.       Right eye: No  discharge.        Left eye: No discharge.  Neck:     Musculoskeletal: Neck supple. No muscular tenderness.     Thyroid: No thyromegaly.  Cardiovascular:     Rate and Rhythm: Normal rate and regular rhythm.  Pulmonary:     Effort: No tachypnea, accessory muscle usage or respiratory distress.  Breath sounds: Normal breath sounds. No decreased breath sounds or wheezing.  Chest:     Breasts:        Right: No inverted nipple, mass, nipple discharge or tenderness (no axillary adenopathy).        Left: No inverted nipple, mass, nipple discharge or tenderness (no axilarry adenopathy).  Abdominal:     General: Bowel sounds are normal.     Palpations: Abdomen is soft.     Tenderness: There is no abdominal tenderness.  Genitourinary:    Comments: Normal external genitalia.  Vaginal vault without lesions.  Cervix identified.  Pap smear performed.  Could not appreciate any adnexal masses or tenderness.   Musculoskeletal:        General: No swelling or tenderness.  Lymphadenopathy:     Cervical: No cervical adenopathy.  Skin:    Findings: No erythema or rash.  Neurological:     Mental Status: She is alert and oriented to person, place, and time.  Psychiatric:        Mood and Affect: Mood normal.        Behavior: Behavior normal.     BP 110/70   Pulse 87   Temp 98.4 F (36.9 C) (Oral)   Wt 127 lb 12.8 oz (58 kg)   LMP 02/12/2013   SpO2 98%   BMI 23.37 kg/m  Wt Readings from Last 3 Encounters:  03/08/18 127 lb 12.8 oz (58 kg)  11/16/17 124 lb (56.2 kg)  10/15/17 125 lb (56.7 kg)     Lab Results  Component Value Date   WBC 6.5 03/08/2018   HGB 13.4 03/08/2018   HCT 39.4 03/08/2018   PLT 253.0 03/08/2018   GLUCOSE 73 03/08/2018   CHOL 187 02/26/2017   TRIG 177.0 (H) 02/26/2017   HDL 64.00 02/26/2017   LDLCALC 88 02/26/2017   ALT 26 03/08/2018   AST 25 03/08/2018   NA 138 03/08/2018   K 4.1 03/08/2018   CL 101 03/08/2018   CREATININE 0.73 03/08/2018   BUN 12  03/08/2018   CO2 28 03/08/2018   TSH 1.36 03/08/2018    Mm 3d Screen Breast Bilateral  Result Date: 12/03/2017 CLINICAL DATA:  Screening. EXAM: DIGITAL SCREENING BILATERAL MAMMOGRAM WITH TOMO AND CAD COMPARISON:  Previous exam(s). ACR Breast Density Category c: The breast tissue is heterogeneously dense, which may obscure small masses. FINDINGS: There are no findings suspicious for malignancy. Images were processed with CAD. IMPRESSION: No mammographic evidence of malignancy. A result letter of this screening mammogram will be mailed directly to the patient. RECOMMENDATION: Screening mammogram in one year. (Code:SM-B-01Y) BI-RADS CATEGORY  1: Negative. Electronically Signed   By: Everlean Alstrom M.D.   On: 12/03/2017 16:08       Assessment & Plan:   Problem List Items Addressed This Visit    Anemia    Follow cbc.        Anxiety    Takes xanax.  Stable.        GERD (gastroesophageal reflux disease)    Saw GI.  Recommended prilosec.  Follow.  Symptoms better off vitamin supplements.        Relevant Medications   famotidine (PEPCID) 40 MG tablet   Health care maintenance    Physical today 03/08/18.  PAP 03/02/17 - negative with negative HPV.  Colonoscopy 11/2014.  Recommended f/u in 11/2024.        Hyperthyroidism    S/p ablation.  Follow tsh.       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  Back better.        Vitamin D insufficiency    Off supplements.  Follow.         Other Visit Diagnoses    Routine general medical examination at a health care facility    -  Primary   Itching       No rash.  Itching as outlined.  Benadryl helped.  No new contacts.  Zyrtec/claritin as directed.  Pepcid in the evening.  Follow.  Call with update.    Relevant Orders   CBC with Differential/Platelet (Completed)   Hepatic function panel (Completed)   TSH (Completed)   Basic metabolic panel (Completed)   Cervical cancer screening       Relevant Orders    Cytology - PAP( [facility])       [name], MD

## 2018-03-08 NOTE — Assessment & Plan Note (Signed)
S/p injections.  Back better.

## 2018-03-08 NOTE — Assessment & Plan Note (Signed)
Saw GI.  Recommended prilosec.  Follow.  Symptoms better off vitamin supplements.

## 2018-03-09 ENCOUNTER — Encounter: Payer: Self-pay | Admitting: Internal Medicine

## 2018-03-10 ENCOUNTER — Encounter: Payer: Self-pay | Admitting: Internal Medicine

## 2018-03-10 LAB — CYTOLOGY - PAP
DIAGNOSIS: NEGATIVE
HPV (WINDOPATH): NOT DETECTED

## 2018-03-11 DIAGNOSIS — J301 Allergic rhinitis due to pollen: Secondary | ICD-10-CM | POA: Diagnosis not present

## 2018-03-11 DIAGNOSIS — J3089 Other allergic rhinitis: Secondary | ICD-10-CM | POA: Diagnosis not present

## 2018-03-16 DIAGNOSIS — J3089 Other allergic rhinitis: Secondary | ICD-10-CM | POA: Diagnosis not present

## 2018-03-16 DIAGNOSIS — J301 Allergic rhinitis due to pollen: Secondary | ICD-10-CM | POA: Diagnosis not present

## 2018-03-23 DIAGNOSIS — J3089 Other allergic rhinitis: Secondary | ICD-10-CM | POA: Diagnosis not present

## 2018-03-23 DIAGNOSIS — J301 Allergic rhinitis due to pollen: Secondary | ICD-10-CM | POA: Diagnosis not present

## 2018-03-28 NOTE — Progress Notes (Signed)
Patient's Name: Shannon [name]  MRN: [id number]  Referring Provider: [name], MD  DOB: 1966/01/10  PCP: Shannon Pheasant, MD  DOS: 03/29/2018  Note by: Shannon Cola, MD  Service setting: Ambulatory outpatient  Specialty: Interventional Pain Management  Location: [facility] (AMB) Pain Management Facility    Patient type: Established   Primary Reason(s) for Visit: Evaluation of chronic illnesses with exacerbation, or progression (Level of risk: moderate) CC: Back Pain (lower)  HPI  Shannon [name] is a 53 y.o. year old, female patient, who comes today for a follow-up evaluation. She has Hyperthyroidism; GERD (gastroesophageal reflux disease); Anemia; Migraine; Menopausal symptoms; Health care maintenance; Anxiety; Gastroesophageal reflux disease without esophagitis; Chronic low back pain (Primary Area of Pain) (Bilateral) (R>L); Lumbar lateral recess stenosis (L4-5) (Left); Lumbar facet hypertrophy; Chronic lumbar radicular pain [facility] source of pain) (intermittent) (L4 Dermatome) (Left); Chronic hip pain (resolved after hip replacement) (Left); Chronic sacroiliac joint pain (Secondary source of pain) (intermittent) (Left); Nicotine dependence; Tobacco abuse; Lumbar spondylosis; Encounter for chronic pain management; Post-menopausal bleeding; Axillary lump; Osteoarthritis of sacroiliac joint (Bilateral) (R>L); History of hip replacement (Left); Osteoarthritis of hip (Left); Chronic sacroiliac joint pain (Right); Chronic myofascial pain syndrome (Right Gluteous Muscle); Chronic pain syndrome; Other specified dorsopathies, sacral and sacrococcygeal region; Urinary urgency; Lumbar Facet syndrome (Right); Spondylosis without myelopathy or radiculopathy, lumbosacral region; Chronic toe pain (Right); Great toe pain (Right); Disorder of skeletal system; Pharmacologic therapy; Problems influencing health status; Osteoarthritis of great toe joint (Right); and Vitamin D insufficiency on their problem list. Shannon [name]  was last seen on 11/16/2017. Her primarily concern today is the Back Pain (lower)  Pain Assessment: Location: Lower, Left, Right(worse in right side) Back Radiating: denies Onset: More than a month ago Duration: Chronic pain Quality: Aching Severity:  /10 (subjective, self-reported pain score)  Note: Reported level is compatible with observation.                         When using our objective Pain Scale, levels between 6 and 10/10 are said to belong in an emergency room, as it progressively worsens from a 6/10, described as severely limiting, requiring emergency care not usually available at an outpatient pain management facility. At a 6/10 level, communication becomes difficult and requires great effort. Assistance to reach the emergency department may be required. Facial flushing and profuse sweating along with potentially dangerous increases in heart rate and blood pressure will be evident. Effect on ADL:   Timing: Intermittent Modifying factors: heating pad, Tylenol, chiropractor BP: 116/75  HR: 73  Further details on both, my assessment(s), as well as the proposed treatment plan, please see below.  Laboratory Chemistry  Inflammation Markers (CRP: Acute Phase) (ESR: Chronic Phase) Lab Results  Component Value Date   CRP 2 10/05/2017   ESRSEDRATE 7 10/05/2017                         Rheumatology Markers Lab Results  Component Value Date   LABURIC 3.4 10/05/2017   URICUR 10.5 10/05/2017   HLAB27 Negative 10/05/2017                        Renal Function Markers Lab Results  Component Value Date   BUN 12 03/08/2018   CREATININE 0.73 03/08/2018   GFRAA >60 05/02/2014   GFRNONAA >60 05/02/2014  Hepatic Function Markers Lab Results  Component Value Date   AST 25 03/08/2018   ALT 26 03/08/2018   ALBUMIN 4.3 03/08/2018   ALKPHOS 60 03/08/2018                        Electrolytes Lab Results  Component Value Date   NA 138 03/08/2018   K  4.1 03/08/2018   CL 101 03/08/2018   CALCIUM 9.6 03/08/2018                        Neuropathy Markers Lab Results  Component Value Date   VITAMINB12 368 10/05/2017                        CNS Tests No results found.  Bone Pathology Markers Lab Results  Component Value Date   25OHVITD1 28 (L) 10/05/2017   25OHVITD2 <1.0 10/05/2017   25OHVITD3 28 10/05/2017                         Coagulation Parameters Lab Results  Component Value Date   PLT 253.0 03/08/2018                        Cardiovascular Markers Lab Results  Component Value Date   CKTOTAL 65 05/22/2015   TROPONINI <0.03 05/02/2014   HGB 13.4 03/08/2018   HCT 39.4 03/08/2018                         CA Markers No results found.  Endocrine Markers Lab Results  Component Value Date   TSH 1.36 03/08/2018   FREET4 0.72 08/30/2014                        Note: Lab results reviewed.  Imaging Review   Complexity Note: No results found under the [facility] electronic medical record.                         Meds   Current Outpatient Medications:  .  acetaminophen (TYLENOL) 500 MG tablet, Take 1,000 mg by mouth every 6 (six) hours as needed for moderate pain., Disp: , Rfl:  .  ALPRAZolam (XANAX) 0.25 MG tablet, TAKE ONE TABLET AT BEDTIME AS NEEDED FORANXIETY, Disp: 30 tablet, Rfl: 1 .  amoxicillin (AMOXIL) 500 MG capsule, , Disp: , Rfl:  .  EPINEPHrine 0.3 mg/0.3 mL IJ SOAJ injection, , Disp: , Rfl:  .  famotidine (PEPCID) 40 MG tablet, Take by mouth., Disp: , Rfl:  .  omeprazole (PRILOSEC) 40 MG capsule, Take 40 mg by mouth daily., Disp: , Rfl:  .  SUMAtriptan (IMITREX) 100 MG tablet, , Disp: , Rfl:   ROS  Constitutional: Denies any fever or chills Gastrointestinal: No reported hemesis, hematochezia, vomiting, or acute GI distress Musculoskeletal: Denies any acute onset joint swelling, redness, loss of ROM, or weakness Neurological: No reported episodes of acute onset apraxia, aphasia, dysarthria,  agnosia, amnesia, paralysis, loss of coordination, or loss of consciousness  Allergies  Shannon [name] is allergic to codeine; decongest-aid [pseudoephedrine]; flagyl [metronidazole]; and sulfa antibiotics.  [place]  Drug: Shannon [name]  reports no history of drug use. Alcohol:  reports no history of alcohol use. Tobacco:  reports that she quit smoking about 18 years ago. She has never used  smokeless tobacco. Medical:  has a past medical history of Anemia, Anxiety, Breast pain, right (11/07/2012), Breathing difficulty (06/20/2014), Environmental allergies, GERD (gastroesophageal reflux disease), Hiatal hernia, History of migraine headaches, Hyperthyroidism, and Vaginitis (01/16/2013). Surgical: Ms. Vanderheiden  has a past surgical history that includes Septoplasty (5/90); Nasal sinus surgery (11/93); Cesarean section (11/98); Tubal ligation (02/1998); Umbilical hernia repair (02/1998); Cholecystectomy (11/05); Joint replacement (Left, 2017); and Breast biopsy (Right, 12/05/2015). Family: family history includes Breast cancer in an other family member; Breast cancer (age of onset: 1) in her mother; Heart disease in her father and maternal grandfather; Hypercholesterolemia in her father and mother; Hypertension in her father; Kidney cancer in her father; Leukemia in her father; Lung cancer in her paternal grandfather; Pancreatic cancer in her paternal grandfather; Prostate cancer in her paternal grandfather; Thyroid disease in her father and mother.  Constitutional Exam  General appearance: Well nourished, well developed, and well hydrated. In no apparent acute distress Vitals:   03/29/18 1257  BP: 116/75  Pulse: 73  Resp: 16  Temp: 98.2 F (36.8 C)  TempSrc: Oral  SpO2: 100%  Weight: 127 lb (57.6 kg)  Height: '5\' 2"'  (1.575 m)   BMI Assessment: Estimated body mass index is 23.23 kg/m as calculated from the following:   Height as of this encounter: '5\' 2"'  (1.575 m).   Weight as of this encounter: 127 lb (57.6  kg).  BMI interpretation table: BMI level Category Range association with higher incidence of chronic pain  <18 kg/m2 Underweight   18.5-24.9 kg/m2 Ideal body weight   25-29.9 kg/m2 Overweight Increased incidence by 20%  30-34.9 kg/m2 Obese (Class I) Increased incidence by 68%  35-39.9 kg/m2 Severe obesity (Class II) Increased incidence by 136%  >40 kg/m2 Extreme obesity (Class III) Increased incidence by 254%   Patient's current BMI Ideal Body weight  Body mass index is 23.23 kg/m. Ideal body weight: 50.1 kg (110 lb 7.2 oz) Adjusted ideal body weight: 53.1 kg (117 lb 1.1 oz)   BMI Readings from Last 4 Encounters:  03/29/18 23.23 kg/m  03/08/18 23.37 kg/m  11/16/17 22.68 kg/m  10/15/17 22.86 kg/m   Wt Readings from Last 4 Encounters:  03/29/18 127 lb (57.6 kg)  03/08/18 127 lb 12.8 oz (58 kg)  11/16/17 124 lb (56.2 kg)  10/15/17 125 lb (56.7 kg)  Psych/Mental status: Alert, oriented x 3 (person, place, & time)       Eyes: PERLA Respiratory: No evidence of acute respiratory distress  Cervical Spine Area Exam  Skin & Axial Inspection: No masses, redness, edema, swelling, or associated skin lesions Alignment: Symmetrical Functional ROM: Unrestricted ROM      Stability: No instability detected Muscle Tone/Strength: Functionally intact. No obvious neuro-muscular anomalies detected. Sensory (Neurological): Unimpaired Palpation: No palpable anomalies              Upper Extremity (UE) Exam    Side: Right upper extremity  Side: Left upper extremity  Skin & Extremity Inspection: Skin color, temperature, and hair growth are WNL. No peripheral edema or cyanosis. No masses, redness, swelling, asymmetry, or associated skin lesions. No contractures.  Skin & Extremity Inspection: Skin color, temperature, and hair growth are WNL. No peripheral edema or cyanosis. No masses, redness, swelling, asymmetry, or associated skin lesions. No contractures.  Functional ROM: Unrestricted ROM           Functional ROM: Unrestricted ROM          Muscle Tone/Strength: Functionally intact. No obvious neuro-muscular anomalies detected.  Muscle Tone/Strength: Functionally intact. No obvious neuro-muscular anomalies detected.  Sensory (Neurological): Unimpaired          Sensory (Neurological): Unimpaired          Palpation: No palpable anomalies              Palpation: No palpable anomalies              Provocative Test(s):  Phalen's test: deferred Tinel's test: deferred Apley's scratch test (touch opposite shoulder):  Action 1 (Across chest): deferred Action 2 (Overhead): deferred Action 3 (LB reach): deferred   Provocative Test(s):  Phalen's test: deferred Tinel's test: deferred Apley's scratch test (touch opposite shoulder):  Action 1 (Across chest): deferred Action 2 (Overhead): deferred Action 3 (LB reach): deferred    Thoracic Spine Area Exam  Skin & Axial Inspection: No masses, redness, or swelling Alignment: Symmetrical Functional ROM: Unrestricted ROM Stability: No instability detected Muscle Tone/Strength: Functionally intact. No obvious neuro-muscular anomalies detected. Sensory (Neurological): Unimpaired Muscle strength & Tone: No palpable anomalies  Lumbar Spine Area Exam  Skin & Axial Inspection: No masses, redness, or swelling Alignment: Symmetrical Functional ROM: Diminished ROM affecting both sides Stability: No instability detected Muscle Tone/Strength: Guarding observed Sensory (Neurological): Movement-associated pain Palpation: Complains of area being tender to palpation       Provocative Tests: Hyperextension/rotation test: (+) bilaterally for facet joint pain. Lumbar quadrant test (Kemp's test): (+) bilaterally for facet joint pain. Lateral bending test: deferred today       Patrick's Maneuver: (-) for bilateral S-I arthralgia and for bilateral hip arthralgia FABER* test: (-) for bilateral S-I arthralgia and for bilateral hip arthralgia S-I anterior  distraction/compression test: deferred today         S-I lateral compression test: deferred today         S-I Thigh-thrust test: deferred today         S-I Gaenslen's test: deferred today         *(Flexion, ABduction and External Rotation)  Gait & Posture Assessment  Ambulation: Unassisted Gait: Relatively normal for age and body habitus Posture: WNL   Lower Extremity Exam    Side: Right lower extremity  Side: Left lower extremity  Stability: No instability observed          Stability: No instability observed          Skin & Extremity Inspection: Skin color, temperature, and hair growth are WNL. No peripheral edema or cyanosis. No masses, redness, swelling, asymmetry, or associated skin lesions. No contractures.  Skin & Extremity Inspection: Skin color, temperature, and hair growth are WNL. No peripheral edema or cyanosis. No masses, redness, swelling, asymmetry, or associated skin lesions. No contractures.  Functional ROM: Unrestricted ROM                  Functional ROM: Decreased ROM for hip joint due to joint replacement.          Muscle Tone/Strength: Able to Toe-walk & Heel-walk without problems  Muscle Tone/Strength: Able to Toe-walk & Heel-walk without problems  Sensory (Neurological): Unimpaired        Sensory (Neurological): Unimpaired        DTR: Patellar: deferred today Achilles: deferred today Plantar: deferred today  DTR: Patellar: deferred today Achilles: deferred today Plantar: deferred today  Palpation: No palpable anomalies  Palpation: No palpable anomalies   Assessment   Status Diagnosis  Worsening Recurring Stable 1. Chronic low back pain (Primary Area of Pain) (Bilateral) (R>L)   2. Lumbar  Facet syndrome (Right)   3. Lumbar facet hypertrophy      Updated Problems: No problems updated.  Plan of Care  Pharmacotherapy (Medications Ordered): No orders of the defined types were placed in this encounter.  Medications administered today: [name]  had no medications administered during this visit.  Orders:  Orders Placed This Encounter  Procedures  . LUMBAR FACET(MEDIAL BRANCH NERVE BLOCK) MBNB    Standing Status:   Future    Standing Expiration Date:   04/29/2018    Scheduling Instructions:     Side: Bilateral     Level: L3-4, L4-5, & L5-S1 Facets (L2, L3, L4, L5, & S1 Medial Branch Nerves)     Sedation: No Sedation.     Timeframe: ASAA    Order Specific Question:   Where will this procedure be performed?    Answer:   [facility]   Lab Orders  No laboratory test(s) ordered today   Imaging Orders  No imaging studies ordered today   Referral Orders  No referral(s) requested today   Interventional management options: Planned follow-up:    Plan: Return for Procedure (no sedation): (B) L-FCT BLK #2.   Considering:   Diagnosticright lumbar facet block #2 Possible right-sided lumbar facet RFA#1 Diagnostic right-sided sacroiliac joint block#4 Possible right-sided sacroiliac joint RFA#1 Palliative right-sided L4-5LESI#2 Palliative left-sided L4-5 LESI #2 Palliative left-sided sacroiliac joint block#2 Possibleleft-sided sacroiliac joint RFA#1 Diagnostic right great toe intra-articular injection #2   Palliative PRN treatment(s):   Palliative right-sidedintra-articularsmall joint injection1stMTP (DorsalMetatarsophalangeal )#2 Palliativeright gluteal MNB Diagnostic right-sided sacroiliac joint block Palliative right-sided L4-5 lumbar epidural steroid injection Palliative left-sided sacroiliac joint block Palliative right-sided lumbar facet blocks Diagnostic right great toe intra-articular injection(s)   Future Appointments  Date Time Provider [place]  09/06/2018  1:30 PM Shannon Pheasant, MD [facility]   Primary Care Physician: Shannon Pheasant, MD Location: [facility] Note by: Shannon Cola, MD Date: 03/29/2018; Time: 1:20 PM

## 2018-03-29 ENCOUNTER — Encounter: Payer: Self-pay | Admitting: Pain Medicine

## 2018-03-29 ENCOUNTER — Other Ambulatory Visit: Payer: Self-pay

## 2018-03-29 ENCOUNTER — Ambulatory Visit: Payer: 59 | Attending: Pain Medicine | Admitting: Pain Medicine

## 2018-03-29 VITALS — BP 116/75 | HR 73 | Temp 98.2°F | Resp 16 | Ht 62.0 in | Wt 127.0 lb

## 2018-03-29 DIAGNOSIS — G8929 Other chronic pain: Secondary | ICD-10-CM | POA: Diagnosis not present

## 2018-03-29 DIAGNOSIS — M545 Low back pain: Secondary | ICD-10-CM | POA: Insufficient documentation

## 2018-03-29 DIAGNOSIS — M47816 Spondylosis without myelopathy or radiculopathy, lumbar region: Secondary | ICD-10-CM | POA: Insufficient documentation

## 2018-03-29 NOTE — Progress Notes (Signed)
Safety precautions to be maintained throughout the outpatient stay will include: orient to surroundings, keep bed in low position, maintain call bell within reach at all times, provide assistance with transfer out of bed and ambulation.  

## 2018-03-29 NOTE — Patient Instructions (Addendum)
____________________________________________________________________________________________  Preparing for your procedure (without sedation)  Instructions: . Oral Intake: Do not eat or drink anything for at least 3 hours prior to your procedure. . Transportation: Unless otherwise stated by your physician, you may drive yourself after the procedure. . Blood Pressure Medicine: Take your blood pressure medicine with a sip of water the morning of the procedure. . Blood thinners: Notify our staff if you are taking any blood thinners. Depending on which one you take, there will be specific instructions on how and when to stop it. . Diabetics on insulin: Notify the staff so that you can be scheduled 1st case in the morning. If your diabetes requires high dose insulin, take only  of your normal insulin dose the morning of the procedure and notify the staff that you have done so. . Preventing infections: Shower with an antibacterial soap the morning of your procedure.  . Build-up your immune system: Take 1000 mg of Vitamin C with every meal (3 times a day) the day prior to your procedure. Marland Kitchen Antibiotics: Inform the staff if you have a condition or reason that requires you to take antibiotics before dental procedures. . Pregnancy: If you are pregnant, call and cancel the procedure. . Sickness: If you have a cold, fever, or any active infections, call and cancel the procedure. . Arrival: You must be in the facility at least 30 minutes prior to your scheduled procedure. . Children: Do not bring any children with you. . Dress appropriately: Bring dark clothing that you would not mind if they get stained. . Valuables: Do not bring any jewelry or valuables.  Procedure appointments are reserved for interventional treatments only. Marland Kitchen No Prescription Refills. . No medication changes will be discussed during procedure appointments. . No disability issues will be discussed.  Reasons to call and reschedule or  cancel your procedure: (Following these recommendations will minimize the risk of a serious complication.) . Surgeries: Avoid having procedures within 2 weeks of any surgery. (Avoid for 2 weeks before or after any surgery). . Flu Shots: Avoid having procedures within 2 weeks of a flu shots or . (Avoid for 2 weeks before or after immunizations). . Barium: Avoid having a procedure within 7-10 days after having had a radiological study involving the use of radiological contrast. (Myelograms, Barium swallow or enema study). . Heart attacks: Avoid any elective procedures or surgeries for the initial 6 months after a "Myocardial Infarction" (Heart Attack). . Blood thinners: It is imperative that you stop these medications before procedures. Let us know if you if you take any blood thinner.  . Infection: Avoid procedures during or within two weeks of an infection (including chest colds or gastrointestinal problems). Symptoms associated with infections include: Localized redness, fever, chills, night sweats or profuse sweating, burning sensation when voiding, cough, congestion, stuffiness, runny nose, sore throat, diarrhea, nausea, vomiting, cold or Flu symptoms, recent or current infections. It is specially important if the infection is over the area that we intend to treat. Marland Kitchen Heart and lung problems: Symptoms that may suggest an active cardiopulmonary problem include: cough, chest pain, breathing difficulties or shortness of breath, dizziness, ankle swelling, uncontrolled high or unusually low blood pressure, and/or palpitations. If you are experiencing any of these symptoms, cancel your procedure and contact your primary care physician for an evaluation.  Remember:  Regular Business hours are:  Monday to Thursday 8:00 AM to 4:00 PM  Provider's Schedule: Milinda Pointer, MD:  Procedure days: Tuesday and Thursday 7:30 AM to  4:00 PM  Gillis Santa, MD:  Procedure days: Monday and Wednesday 7:30 AM to 4:00  PM ____________________________________________________________________________________________    Preparing for your procedure (without sedation) Instructions: . Oral Intake: Do not eat or drink anything for at least 3 hours prior to your procedure. . Transportation: Unless otherwise stated by your physician, you may drive yourself after the procedure. . Blood Pressure Medicine: Take your blood pressure medicine with a sip of water the morning of the procedure. . Insulin: Take only  of your normal insulin dose. . Preventing infections: Shower with an antibacterial soap the morning of your procedure. . Build-up your immune system: Take 1000 mg of Vitamin C with every meal (3 times a day) the day prior to your procedure. . Pregnancy: If you are pregnant, call and cancel the procedure. . Sickness: If you have a cold, fever, or any active infections, call and cancel the procedure. . Arrival: You must be in the facility at least 30 minutes prior to your scheduled procedure. . Children: Do not bring any children with you. . Dress appropriately: Bring dark clothing that you would not mind if they get stained. . Valuables: Do not bring any jewelry or valuables. Procedure appointments are reserved for interventional treatments only. Marland Kitchen No Prescription Refills. . No medication changes will be discussed during procedure appointments. . No disability issues will be discussed.

## 2018-03-30 DIAGNOSIS — J3089 Other allergic rhinitis: Secondary | ICD-10-CM | POA: Diagnosis not present

## 2018-03-30 DIAGNOSIS — J301 Allergic rhinitis due to pollen: Secondary | ICD-10-CM | POA: Diagnosis not present

## 2018-04-06 DIAGNOSIS — J3089 Other allergic rhinitis: Secondary | ICD-10-CM | POA: Diagnosis not present

## 2018-04-06 DIAGNOSIS — J301 Allergic rhinitis due to pollen: Secondary | ICD-10-CM | POA: Diagnosis not present

## 2018-04-08 ENCOUNTER — Ambulatory Visit: Payer: 59 | Admitting: Pain Medicine

## 2018-04-13 ENCOUNTER — Encounter: Payer: Self-pay | Admitting: Pain Medicine

## 2018-04-13 ENCOUNTER — Other Ambulatory Visit: Payer: Self-pay

## 2018-04-13 ENCOUNTER — Ambulatory Visit (HOSPITAL_BASED_OUTPATIENT_CLINIC_OR_DEPARTMENT_OTHER): Payer: 59 | Admitting: Pain Medicine

## 2018-04-13 ENCOUNTER — Ambulatory Visit
Admission: RE | Admit: 2018-04-13 | Discharge: 2018-04-13 | Disposition: A | Discharge status: Home / Self Care | Payer: 59 | Source: Ambulatory Visit | Attending: Pain Medicine | Admitting: Pain Medicine

## 2018-04-13 VITALS — BP 155/84 | HR 72 | Temp 98.8°F | Resp 20 | Ht 62.0 in | Wt 127.0 lb

## 2018-04-13 DIAGNOSIS — M47816 Spondylosis without myelopathy or radiculopathy, lumbar region: Secondary | ICD-10-CM | POA: Diagnosis not present

## 2018-04-13 DIAGNOSIS — M545 Low back pain: Secondary | ICD-10-CM | POA: Insufficient documentation

## 2018-04-13 DIAGNOSIS — G8929 Other chronic pain: Secondary | ICD-10-CM

## 2018-04-13 DIAGNOSIS — M5137 Other intervertebral disc degeneration, lumbosacral region: Secondary | ICD-10-CM | POA: Diagnosis not present

## 2018-04-13 DIAGNOSIS — M47817 Spondylosis without myelopathy or radiculopathy, lumbosacral region: Secondary | ICD-10-CM | POA: Diagnosis not present

## 2018-04-13 DIAGNOSIS — M51379 Other intervertebral disc degeneration, lumbosacral region without mention of lumbar back pain or lower extremity pain: Secondary | ICD-10-CM | POA: Insufficient documentation

## 2018-04-13 MED ORDER — TRIAMCINOLONE ACETONIDE 40 MG/ML IJ SUSP
80.0000 mg | Freq: Once | INTRAMUSCULAR | Status: AC
  Administered 2018-04-13: 80 mg
  Filled 2018-04-13: qty 2

## 2018-04-13 MED ORDER — LIDOCAINE HCL 2 % IJ SOLN
20.0000 mL | Freq: Once | INTRAMUSCULAR | Status: AC
  Administered 2018-04-13: 400 mg
  Filled 2018-04-13: qty 40

## 2018-04-13 MED ORDER — ROPIVACAINE HCL 2 MG/ML IJ SOLN
18.0000 mL | Freq: Once | INTRAMUSCULAR | Status: AC
  Administered 2018-04-13: 18 mL via PERINEURAL
  Filled 2018-04-13: qty 20

## 2018-04-13 NOTE — Progress Notes (Signed)
Safety precautions to be maintained throughout the outpatient stay will include: orient to surroundings, keep bed in low position, maintain call bell within reach at all times, provide assistance with transfer out of bed and ambulation.  

## 2018-04-13 NOTE — Progress Notes (Signed)
Patient's Name: Shannon [name]  MRN: [id number]  Referring Provider: Milinda Pointer, MD  DOB: [birth year]  PCP: [name], MD  DOS: 04/13/2018  Note by: Gaspar Cola, MD  Service setting: Ambulatory outpatient  Specialty: Interventional Pain Management  Patient type: Established  Location: [facility] (AMB) Pain Management Facility  Visit type: Interventional Procedure   Primary Reason for Visit: Interventional Pain Management Treatment. CC: Back Pain  Procedure:          Anesthesia, Analgesia, Anxiolysis:  Type: Lumbar Facet, Medial Branch Block(s) #2  Primary Purpose: Diagnostic Region: Posterolateral Lumbosacral Spine Level: L2, L3, L4, L5, & S1 Medial Branch Level(s). Injecting these levels blocks the L3-4, L4-5, and L5-S1 lumbar facet joints. Laterality: Bilateral  Type: Local Anesthesia Indication(s): Analgesia         Route: Infiltration ([place]/IM) IV Access: Declined Sedation: Declined  Local Anesthetic: Lidocaine 1-2%  Position: Prone   Indications: 1. Spondylosis without myelopathy or radiculopathy, lumbosacral region   2. Lumbar Facet syndrome (Bilateral) (R>L)   3. Lumbar facet hypertrophy   4. DDD (degenerative disc disease), lumbosacral   5. Chronic low back pain (Primary Area of Pain) (Bilateral) (R>L)    Pain Score: Pre-procedure: 3 /10 Post-procedure: 3 /10  Pre-op Assessment:  Shannon [name] is a 53 y.o. (year old), female patient, seen today for interventional treatment. She  has a past surgical history that includes Septoplasty (5/90); Nasal sinus surgery (11/93); Cesarean section (11/98); Tubal ligation (02/1998); Umbilical hernia repair (02/1998); Cholecystectomy (11/05); Joint replacement (Left, 2017); and Breast biopsy (Right, 12/05/2015). Shannon [name] has a current medication list which includes the following prescription(s): acetaminophen, alprazolam, amoxicillin, epinephrine, famotidine, omeprazole, and sumatriptan. Her primarily concern today is the Back  Pain  Initial Vital Signs:  Pulse/HCG Rate: 72ECG Heart Rate: 65 Temp: 98.8 F (37.1 C) Resp: 16 BP: 111/80 SpO2: 100 %  BMI: Estimated body mass index is 23.23 kg/m as calculated from the following:   Height as of this encounter: 5\' 2"  (1.575 m).   Weight as of this encounter: 127 lb (57.6 kg).  Risk Assessment: Allergies: Reviewed. She is allergic to codeine; decongest-aid [pseudoephedrine]; flagyl [metronidazole]; and sulfa antibiotics.  Allergy Precautions: None required Coagulopathies: Reviewed. None identified.  Blood-thinner therapy: None at this time Active Infection(s): Reviewed. None identified. Shannon [name] is afebrile  Site Confirmation: Shannon [name] was asked to confirm the procedure and laterality before marking the site Procedure checklist: Completed Consent: Before the procedure and under the influence of no sedative(s), amnesic(s), or anxiolytics, the patient was informed of the treatment options, risks and possible complications. To fulfill our ethical and legal obligations, as recommended by the American Medical Association's Code of Ethics, I have informed the patient of my clinical impression; the nature and purpose of the treatment or procedure; the risks, benefits, and possible complications of the intervention; the alternatives, including doing nothing; the risk(s) and benefit(s) of the alternative treatment(s) or procedure(s); and the risk(s) and benefit(s) of doing nothing. The patient was provided information about the general risks and possible complications associated with the procedure. These may include, but are not limited to: failure to achieve desired goals, infection, bleeding, organ or nerve damage, allergic reactions, paralysis, and death. In addition, the patient was informed of those risks and complications associated to Spine-related procedures, such as failure to decrease pain; infection (i.e.: Meningitis, epidural or intraspinal abscess); bleeding (i.e.:  epidural hematoma, subarachnoid hemorrhage, or any other type of intraspinal or peri-dural bleeding); organ or nerve damage (i.e.: Any  type of peripheral nerve, nerve root, or spinal cord injury) with subsequent damage to sensory, motor, and/or autonomic systems, resulting in permanent pain, numbness, and/or weakness of one or several areas of the body; allergic reactions; (i.e.: anaphylactic reaction); and/or death. Furthermore, the patient was informed of those risks and complications associated with the medications. These include, but are not limited to: allergic reactions (i.e.: anaphylactic or anaphylactoid reaction(s)); adrenal axis suppression; blood sugar elevation that in diabetics may result in ketoacidosis or comma; water retention that in patients with history of congestive heart failure may result in shortness of breath, pulmonary edema, and decompensation with resultant heart failure; weight gain; swelling or edema; medication-induced neural toxicity; particulate matter embolism and blood vessel occlusion with resultant organ, and/or nervous system infarction; and/or aseptic necrosis of one or more joints. Finally, the patient was informed that Medicine is not an exact science; therefore, there is also the possibility of unforeseen or unpredictable risks and/or possible complications that may result in a catastrophic outcome. The patient indicated having understood very clearly. We have given the patient no guarantees and we have made no promises. Enough time was given to the patient to ask questions, all of which were answered to the patient's satisfaction. Shannon [name] has indicated that she wanted to continue with the procedure. Attestation: I, the ordering provider, attest that I have discussed with the patient the benefits, risks, side-effects, alternatives, likelihood of achieving goals, and potential problems during recovery for the procedure that I have provided informed consent. Date   Time:  04/13/2018  1:08 PM  Pre-Procedure Preparation:  Monitoring: As per clinic protocol. Respiration, ETCO2, SpO2, BP, heart rate and rhythm monitor placed and checked for adequate function Safety Precautions: Patient was assessed for positional comfort and pressure points before starting the procedure. Time-out: I initiated and conducted the "Time-out" before starting the procedure, as per protocol. The patient was asked to participate by confirming the accuracy of the "Time Out" information. Verification of the correct person, site, and procedure were performed and confirmed by me, the nursing staff, and the patient. "Time-out" conducted as per Joint Commission's Universal Protocol (UP.01.01.01). Time: 1436  Description of Procedure:          Laterality: Bilateral. The procedure was performed in identical fashion on both sides. Levels:  L2, L3, L4, L5, & S1 Medial Branch Level(s) Area Prepped: Posterior Lumbosacral Region Prepping solution: ChloraPrep (2% chlorhexidine gluconate and 70% isopropyl alcohol) Safety Precautions: Aspiration looking for blood return was conducted prior to all injections. At no point did we inject any substances, as a needle was being advanced. Before injecting, the patient was told to immediately notify me if she was experiencing any new onset of "ringing in the ears, or metallic taste in the mouth". No attempts were made at seeking any paresthesias. Safe injection practices and needle disposal techniques used. Medications properly checked for expiration dates. SDV (single dose vial) medications used. After the completion of the procedure, all disposable equipment used was discarded in the proper designated medical waste containers. Local Anesthesia: Protocol guidelines were followed. The patient was positioned over the fluoroscopy table. The area was prepped in the usual manner. The time-out was completed. The target area was identified using fluoroscopy. A 12-in long,  straight, sterile hemostat was used with fluoroscopic guidance to locate the targets for each level blocked. Once located, the skin was marked with an approved surgical skin marker. Once all sites were marked, the skin (epidermis, dermis, and hypodermis), as well as deeper  tissues (fat, connective tissue and muscle) were infiltrated with a small amount of a short-acting local anesthetic, loaded on a 10cc syringe with a 25G, 1.5-in  Needle. An appropriate amount of time was allowed for local anesthetics to take effect before proceeding to the next step. Local Anesthetic: Lidocaine 2.0% The unused portion of the local anesthetic was discarded in the proper designated containers. Technical explanation of process:  L2 Medial Branch Nerve Block (MBB): The target area for the L2 medial branch is at the junction of the postero-lateral aspect of the superior articular process and the superior, posterior, and medial edge of the transverse process of L3. Under fluoroscopic guidance, a Quincke needle was inserted until contact was made with os over the superior postero-lateral aspect of the pedicular shadow (target area). After negative aspiration for blood, 0.5 mL of the nerve block solution was injected without difficulty or complication. The needle was removed intact. L3 Medial Branch Nerve Block (MBB): The target area for the L3 medial branch is at the junction of the postero-lateral aspect of the superior articular process and the superior, posterior, and medial edge of the transverse process of L4. Under fluoroscopic guidance, a Quincke needle was inserted until contact was made with os over the superior postero-lateral aspect of the pedicular shadow (target area). After negative aspiration for blood, 0.5 mL of the nerve block solution was injected without difficulty or complication. The needle was removed intact. L4 Medial Branch Nerve Block (MBB): The target area for the L4 medial branch is at the junction of the  postero-lateral aspect of the superior articular process and the superior, posterior, and medial edge of the transverse process of L5. Under fluoroscopic guidance, a Quincke needle was inserted until contact was made with os over the superior postero-lateral aspect of the pedicular shadow (target area). After negative aspiration for blood, 0.5 mL of the nerve block solution was injected without difficulty or complication. The needle was removed intact. L5 Medial Branch Nerve Block (MBB): The target area for the L5 medial branch is at the junction of the postero-lateral aspect of the superior articular process and the superior, posterior, and medial edge of the sacral ala. Under fluoroscopic guidance, a Quincke needle was inserted until contact was made with os over the superior postero-lateral aspect of the pedicular shadow (target area). After negative aspiration for blood, 0.5 mL of the nerve block solution was injected without difficulty or complication. The needle was removed intact. S1 Medial Branch Nerve Block (MBB): The target area for the S1 medial branch is at the posterior and inferior 6 o'clock position of the L5-S1 facet joint. Under fluoroscopic guidance, the Quincke needle inserted for the L5 MBB was redirected until contact was made with os over the inferior and postero aspect of the sacrum, at the 6 o' clock position under the L5-S1 facet joint (Target area). After negative aspiration for blood, 0.5 mL of the nerve block solution was injected without difficulty or complication. The needle was removed intact.  Nerve block solution: 0.2% PF-Ropivacaine + Triamcinolone (40 mg/mL) diluted to a final concentration of 4 mg of Triamcinolone/mL of Ropivacaine The unused portion of the solution was discarded in the proper designated containers. Procedural Needles: 22-gauge, 3.5-inch, Quincke needles used for all levels.  Once the entire procedure was completed, the treated area was cleaned, making sure  to leave some of the prepping solution back to take advantage of its long term bactericidal properties.   Illustration of the posterior view of the lumbar  spine and the posterior neural structures. Laminae of L2 through S1 are labeled. DPRL5, dorsal primary ramus of L5; DPRS1, dorsal primary ramus of S1; DPR3, dorsal primary ramus of L3; FJ, facet (zygapophyseal) joint L3-L4; I, inferior articular process of L4; LB1, lateral branch of dorsal primary ramus of L1; IAB, inferior articular branches from L3 medial branch (supplies L4-L5 facet joint); IBP, intermediate branch plexus; MB3, medial branch of dorsal primary ramus of L3; NR3, third lumbar nerve root; S, superior articular process of L5; SAB, superior articular branches from L4 (supplies L4-5 facet joint also); TP3, transverse process of L3.  Vitals:   04/13/18 1505 04/13/18 1515 04/13/18 1527 04/13/18 1537  BP: (!) 179/93 (!) 170/99 (!) 162/84 (!) 155/84  Pulse:      Resp: 16 20    Temp:      TempSrc:      SpO2: 100% 100%    Weight:      Height:         Start Time: 1436 hrs. End Time: 1449 hrs.  Imaging Guidance (Spinal):          Type of Imaging Technique: Fluoroscopy Guidance (Spinal) Indication(s): Assistance in needle guidance and placement for procedures requiring needle placement in or near specific anatomical locations not easily accessible without such assistance. Exposure Time: Please see nurses notes. Contrast: None used. Fluoroscopic Guidance: I was personally present during the use of fluoroscopy. "Tunnel Vision Technique" used to obtain the best possible view of the target area. Parallax error corrected before commencing the procedure. "Direction-depth-direction" technique used to introduce the needle under continuous pulsed fluoroscopy. Once target was reached, antero-posterior, oblique, and lateral fluoroscopic projection used confirm needle placement in all planes. Images permanently stored in EMR. Interpretation: No  contrast injected. I personally interpreted the imaging intraoperatively. Adequate needle placement confirmed in multiple planes. Permanent images saved into the patient's record.  Antibiotic Prophylaxis:   Anti-infectives (From admission, onward)   None     Indication(s): None identified  Post-operative Assessment:  Post-procedure Vital Signs:  Pulse/HCG Rate: 7265 Temp: 98.8 F (37.1 C) Resp: 20 BP: (!) 155/84 SpO2: 100 %  EBL: None  Complications: No immediate post-treatment complications observed by team, or reported by patient.  After the procedure, the patient experienced some lightheadedness and therefore we kept her in the recovery area until that went away.  The patient recovered fully without any problems.  Note: The patient tolerated the entire procedure well. A repeat set of vitals were taken after the procedure and the patient was kept under observation following institutional policy, for this type of procedure. Post-procedural neurological assessment was performed, showing return to baseline, prior to discharge. The patient was provided with post-procedure discharge instructions, including a section on how to identify potential problems. Should any problems arise concerning this procedure, the patient was given instructions to immediately contact us, at any time, without hesitation. In any case, we plan to contact the patient by telephone for a follow-up status report regarding this interventional procedure.  Comments:  No additional relevant information.  Plan of Care  Orders:  Orders Placed This Encounter  Procedures   LUMBAR FACET(MEDIAL BRANCH NERVE BLOCK) MBNB    Scheduling Instructions:     Side: Bilateral     Level: L3-4, L4-5, & L5-S1 Facets (L2, L3, L4, L5, & S1 Medial Branch Nerves)     Sedation: No Sedation.     Timeframe: Today    Order Specific Question:   Where will this procedure be performed?  Answer:   [facility]   DG C-Arm 1-60  Min-No Report    Intraoperative interpretation by procedural physician at [place].    Standing Status:   Standing    Number of Occurrences:   1    Order Specific Question:   Reason for exam:    Answer:   Assistance in needle guidance and placement for procedures requiring needle placement in or near specific anatomical locations not easily accessible without such assistance.   Provider attestation of informed consent for procedure/surgical case    I, the ordering provider, attest that I have discussed with the patient the benefits, risks, side effects, alternatives, likelihood of achieving goals and potential problems during recovery for the procedure that I have provided informed consent.   Informed Consent Details: Transcribe to consent form and obtain patient signature    Surgeon: Olis Viverette A. Dossie Arbour, MD    Scheduling Instructions:     Procedure: Diagnostic, Bilateral, Lumbar facet block under fluoroscopic guidance. (See notes for level(s).)     Indications: Chronic low back pain secondary to lumbar facet syndrome and primary osteoarthritis of the lumbar spine   Medications ordered for procedure: Meds ordered this encounter  Medications   lidocaine (XYLOCAINE) 2 % (with pres) injection 400 mg   ropivacaine (PF) 2 mg/mL (0.2%) (NAROPIN) injection 18 mL   triamcinolone acetonide (KENALOG-40) injection 80 mg   Medications administered: We administered lidocaine, ropivacaine (PF) 2 mg/mL (0.2%), and triamcinolone acetonide.  See the medical record for exact dosing, route, and time of administration.  Disposition: Discharge home  Discharge Date & Time: 04/13/2018; 1550 hrs.   Follow-up plan:   Return for PPE (2 wks) w/ Dr. Dossie Arbour.     Future Appointments  Date Time Provider [place]  05/05/2018  1:45 PM Milinda Pointer, MD [facility] None  09/06/2018  1:30 PM [name], MD [facility]   Primary Care Physician: [name], MD Location: [facility]  Outpatient Pain Management Facility Note by: Gaspar Cola, MD Date: 04/13/2018; Time: 4:18 PM  Disclaimer:  Medicine is not an Chief Strategy Officer. The only guarantee in medicine is that nothing is guaranteed. It is important to note that the decision to proceed with this intervention was based on the information collected from the patient. The Data and conclusions were drawn from the patient's questionnaire, the interview, and the physical examination. Because the information was provided in large part by the patient, it cannot be guaranteed that it has not been purposely or unconsciously manipulated. Every effort has been made to obtain as much relevant data as possible for this evaluation. It is important to note that the conclusions that lead to this procedure are derived in large part from the available data. Always take into account that the treatment will also be dependent on availability of resources and existing treatment guidelines, considered by other Pain Management Practitioners as being common knowledge and practice, at the time of the intervention. For Medico-Legal purposes, it is also important to point out that variation in procedural techniques and pharmacological choices are the acceptable norm. The indications, contraindications, technique, and results of the above procedure should only be interpreted and judged by a Board-Certified Interventional Pain Specialist with extensive familiarity and expertise in the same exact procedure and technique.

## 2018-04-13 NOTE — Patient Instructions (Signed)

## 2018-04-13 NOTE — Progress Notes (Signed)
Once patient walked to check out, gait is off and she reported feeling dizzy and swimmy headed.  Patient assisted to chair.  Already drinking soda and taking in crackers.  VS obtained and a marked increase in BP noted.  Dr Dossie Arbour into assess, patient assisted into wheelchair for further observation in RR.  Report to Pollyann Kennedy RN

## 2018-04-14 ENCOUNTER — Telehealth: Payer: Self-pay

## 2018-04-14 NOTE — Telephone Encounter (Signed)
Post procedure phone call.  Patient states she is doing well.  

## 2018-04-15 DIAGNOSIS — J3089 Other allergic rhinitis: Secondary | ICD-10-CM | POA: Diagnosis not present

## 2018-04-15 DIAGNOSIS — J301 Allergic rhinitis due to pollen: Secondary | ICD-10-CM | POA: Diagnosis not present

## 2018-04-20 DIAGNOSIS — J3089 Other allergic rhinitis: Secondary | ICD-10-CM | POA: Diagnosis not present

## 2018-04-20 DIAGNOSIS — J301 Allergic rhinitis due to pollen: Secondary | ICD-10-CM | POA: Diagnosis not present

## 2018-04-27 DIAGNOSIS — J301 Allergic rhinitis due to pollen: Secondary | ICD-10-CM | POA: Diagnosis not present

## 2018-04-27 DIAGNOSIS — J3089 Other allergic rhinitis: Secondary | ICD-10-CM | POA: Diagnosis not present

## 2018-05-05 ENCOUNTER — Other Ambulatory Visit: Payer: Self-pay

## 2018-05-05 ENCOUNTER — Ambulatory Visit: Payer: BLUE CROSS/BLUE SHIELD | Attending: Pain Medicine | Admitting: Pain Medicine

## 2018-05-05 DIAGNOSIS — M47816 Spondylosis without myelopathy or radiculopathy, lumbar region: Secondary | ICD-10-CM

## 2018-05-05 DIAGNOSIS — G8929 Other chronic pain: Secondary | ICD-10-CM | POA: Diagnosis not present

## 2018-05-05 DIAGNOSIS — G894 Chronic pain syndrome: Secondary | ICD-10-CM

## 2018-05-05 DIAGNOSIS — M545 Low back pain: Secondary | ICD-10-CM | POA: Diagnosis not present

## 2018-05-05 NOTE — Progress Notes (Signed)
Pain Management Encounter Note - Virtual Visit via Telephone Telehealth (real-time audio visits between healthcare provider and patient).  Patient's Phone No. & Preferred Pharmacy:  [phone] (home); [phone] (mobile); (Preferred) [facility], [place] - [address] [place] [address] Phone: [phone] Fax: [phone]   Pre-screening note:  Our staff contacted Shannon [name] and offered her an "in person", "face-to-face" appointment versus a telephone encounter. She indicated preferring the telephone encounter, at this time.  Reason for Virtual Visit: COVID-19*  Social distancing based on CDC and AMA recommendations.   I contacted Shannon [name] on 05/05/2018 at 4:03 PM by telephone and clearly identified myself as Gaspar Cola, MD. I verified that I was speaking with the correct person using two identifiers (Name and date of birth: Aug 02, 1965).  Advanced Informed Consent I sought verbal advanced consent from Shannon [name] for telemedicine interactions and virtual visit. I informed Shannon [name] of the security and privacy concerns, risks, and limitations associated with performing an evaluation and management service by telephone. I also informed Shannon [name] of the availability of "in person" appointments and I informed her of the possibility of a patient responsible charge related to this service. Shannon [name] expressed understanding and agreed to proceed.   Historic Elements   Shannon [name] is a 53 y.o. year old, female patient evaluated today after her last encounter by our practice on 04/14/2018. Shannon [name]  has a past medical history of Anemia, Anxiety, Breast pain, right (11/07/2012), Breathing difficulty (06/20/2014), Environmental allergies, GERD (gastroesophageal reflux disease), Hiatal hernia, History of migraine headaches, Hyperthyroidism, and Vaginitis (01/16/2013). She also  has a past surgical history that includes  Septoplasty (5/90); Nasal sinus surgery (11/93); Cesarean section (11/98); Tubal ligation (02/1998); Umbilical hernia repair (02/1998); Cholecystectomy (11/05); Joint replacement (Left, 2017); and Breast biopsy (Right, 12/05/2015). Shannon [name] has a current medication list which includes the following prescription(s): acetaminophen, alprazolam, amoxicillin, epinephrine, famotidine, omeprazole, and sumatriptan. She  reports that she quit smoking about 18 years ago. She has never used smokeless tobacco. She reports that she does not drink alcohol or use drugs. Shannon [name] is allergic to codeine; decongest-aid [pseudoephedrine]; flagyl [metronidazole]; and sulfa antibiotics.   HPI  I last saw her on 04/13/2018. She is being evaluated for a post-procedure assessment.  Post-Procedure Evaluation  Procedure: Diagnostic bilateral lumbar facet block #2 under fluoroscopic guidance, no sedation. Pre-procedure pain level:  3/10 Post-procedure: 3/10 No initial benefit, possibly due to rapid discharge after no sedation procedure, without enough time to allow full onset of block.  Sedation: None.  Effectiveness during initial hour after procedure(Ultra-Short Term Relief): 100 %  Local anesthetic used: Long-acting (4-6 hours) Effectiveness: Defined as any analgesic benefit obtained secondary to the administration of local anesthetics. This carries significant diagnostic value as to the etiological location, or anatomical origin, of the pain. Duration of benefit is expected to coincide with the duration of the local anesthetic used.  Effectiveness during initial 4-6 hours after procedure(Short-Term Relief): 100 %  Long-term benefit: Defined as any relief past the pharmacologic duration of the local anesthetics.  Effectiveness past the initial 6 hours after procedure(Long-Term Relief): 100 %  Current benefits: Defined as benefit that persist at this time.   Analgesia:  100% ongoing relief. Function: Shannon [name] reports  improvement in function ROM: Shannon [name] reports improvement in ROM  Review of recent tests  DG C-Arm 1-60 Min-No Report Fluoroscopy was utilized by the requesting physician.  No radiographic  interpretation.    Office Visit on 03/08/2018  Component Date Value Ref Range Status  . WBC 03/08/2018 6.5  4.0 - 10.5 K/uL Final  . RBC 03/08/2018 4.35  3.87 - 5.11 Mil/uL Final  . Hemoglobin 03/08/2018 13.4  12.0 - 15.0 g/dL Final  . HCT 03/08/2018 39.4  36.0 - 46.0 % Final  . MCV 03/08/2018 90.7  78.0 - 100.0 fl Final  . MCHC 03/08/2018 33.9  30.0 - 36.0 g/dL Final  . RDW 03/08/2018 13.0  11.5 - 15.5 % Final  . Platelets 03/08/2018 253.0  150.0 - 400.0 K/uL Final  . Neutrophils Relative % 03/08/2018 58.0  43.0 - 77.0 % Final  . Lymphocytes Relative 03/08/2018 31.7  12.0 - 46.0 % Final  . Monocytes Relative 03/08/2018 6.9  3.0 - 12.0 % Final  . Eosinophils Relative 03/08/2018 2.5  0.0 - 5.0 % Final  . Basophils Relative 03/08/2018 0.9  0.0 - 3.0 % Final  . Neutro Abs 03/08/2018 3.8  1.4 - 7.7 K/uL Final  . Lymphs Abs 03/08/2018 2.1  0.7 - 4.0 K/uL Final  . Monocytes Absolute 03/08/2018 0.5  0.1 - 1.0 K/uL Final  . Eosinophils Absolute 03/08/2018 0.2  0.0 - 0.7 K/uL Final  . Basophils Absolute 03/08/2018 0.1  0.0 - 0.1 K/uL Final  . Total Bilirubin 03/08/2018 0.3  0.2 - 1.2 mg/dL Final  . Bilirubin, Direct 03/08/2018 0.1  0.0 - 0.3 mg/dL Final  . Alkaline Phosphatase 03/08/2018 60  39 - 117 U/L Final  . AST 03/08/2018 25  0 - 37 U/L Final  . ALT 03/08/2018 26  0 - 35 U/L Final  . Total Protein 03/08/2018 6.8  6.0 - 8.3 g/dL Final  . Albumin 03/08/2018 4.3  3.5 - 5.2 g/dL Final  . TSH 03/08/2018 1.36  0.35 - 4.50 uIU/mL Final  . Sodium 03/08/2018 138  135 - 145 mEq/L Final  . Potassium 03/08/2018 4.1  3.5 - 5.1 mEq/L Final  . Chloride 03/08/2018 101  96 - 112 mEq/L Final  . CO2 03/08/2018 28  19 - 32 mEq/L Final  . Glucose, Bld 03/08/2018 73  70 - 99 mg/dL Final  . BUN 03/08/2018 12  6 -  23 mg/dL Final  . Creatinine, Ser 03/08/2018 0.73  0.40 - 1.20 mg/dL Final  . Calcium 03/08/2018 9.6  8.4 - 10.5 mg/dL Final  . GFR 03/08/2018 82.93  >60.00 mL/min Final  . Adequacy 03/08/2018 Satisfactory for evaluation. The presence or absence of an endocervical / transformation zone component cannot be determined because of atrophy.   Final  . Diagnosis 03/08/2018 NEGATIVE FOR INTRAEPITHELIAL LESIONS OR MALIGNANCY.   Final  . HPV 03/08/2018 NOT DETECTED   Final   Normal Reference Range - NOT Detected  . Material Submitted 03/08/2018 CervicoVaginal Pap [ThinPrep Imaged]   Final  . CYTOLOGY - PAP 03/08/2018 PAP RESULT   Final-Edited   Assessment  There were no encounter diagnoses.  Plan of Care  I am having Shannon [name] maintain her omeprazole, acetaminophen, amoxicillin, ALPRAZolam, EPINEPHrine, famotidine, and SUMAtriptan.  Pharmacotherapy (Medications Ordered): No orders of the defined types were placed in this encounter.  Orders:  No orders of the defined types were placed in this encounter.  Follow-up plan:   Return if symptoms worsen or fail to improve.   I discussed the assessment and treatment plan with the patient. The patient was provided an opportunity to ask questions and all were answered. The patient agreed with  the plan and demonstrated an understanding of the instructions.  Patient advised to call back or seek an in-person evaluation if the symptoms or condition worsens.  Total duration of non-face-to-face encounter: 7 minutes.  Note by: Gaspar Cola, MD Date: 05/05/2018; Time: 4:03 PM  Disclaimer:  * Given the special circumstances of the COVID-19 pandemic, the federal government has announced that the Office for Civil Rights (OCR) will exercise its enforcement discretion and will not impose penalties on physicians using telehealth in the event of noncompliance with regulatory requirements under the [place] and Accountability Act  (HIPAA) in connection with the good faith provision of telehealth during the BBUYZ-70 national public health emergency. ([place])

## 2018-05-06 DIAGNOSIS — J3089 Other allergic rhinitis: Secondary | ICD-10-CM | POA: Diagnosis not present

## 2018-05-06 DIAGNOSIS — J301 Allergic rhinitis due to pollen: Secondary | ICD-10-CM | POA: Diagnosis not present

## 2018-05-06 DIAGNOSIS — J3081 Allergic rhinitis due to animal (cat) (dog) hair and dander: Secondary | ICD-10-CM | POA: Diagnosis not present

## 2018-05-13 DIAGNOSIS — J3081 Allergic rhinitis due to animal (cat) (dog) hair and dander: Secondary | ICD-10-CM | POA: Diagnosis not present

## 2018-05-13 DIAGNOSIS — J3089 Other allergic rhinitis: Secondary | ICD-10-CM | POA: Diagnosis not present

## 2018-05-13 DIAGNOSIS — J301 Allergic rhinitis due to pollen: Secondary | ICD-10-CM | POA: Diagnosis not present

## 2018-05-18 DIAGNOSIS — J3089 Other allergic rhinitis: Secondary | ICD-10-CM | POA: Diagnosis not present

## 2018-05-18 DIAGNOSIS — J301 Allergic rhinitis due to pollen: Secondary | ICD-10-CM | POA: Diagnosis not present

## 2018-05-27 ENCOUNTER — Encounter: Payer: Self-pay | Admitting: Internal Medicine

## 2018-05-27 ENCOUNTER — Telehealth: Payer: Self-pay | Admitting: Internal Medicine

## 2018-05-27 ENCOUNTER — Other Ambulatory Visit: Payer: Self-pay

## 2018-05-27 ENCOUNTER — Ambulatory Visit (INDEPENDENT_AMBULATORY_CARE_PROVIDER_SITE_OTHER): Payer: BLUE CROSS/BLUE SHIELD | Admitting: Internal Medicine

## 2018-05-27 DIAGNOSIS — N898 Other specified noninflammatory disorders of vagina: Secondary | ICD-10-CM

## 2018-05-27 DIAGNOSIS — K219 Gastro-esophageal reflux disease without esophagitis: Secondary | ICD-10-CM | POA: Diagnosis not present

## 2018-05-27 DIAGNOSIS — J3089 Other allergic rhinitis: Secondary | ICD-10-CM | POA: Diagnosis not present

## 2018-05-27 DIAGNOSIS — F419 Anxiety disorder, unspecified: Secondary | ICD-10-CM

## 2018-05-27 DIAGNOSIS — R1031 Right lower quadrant pain: Secondary | ICD-10-CM | POA: Diagnosis not present

## 2018-05-27 DIAGNOSIS — J301 Allergic rhinitis due to pollen: Secondary | ICD-10-CM | POA: Diagnosis not present

## 2018-05-27 MED ORDER — CLINDAMYCIN PHOSPHATE 2 % VA CREA: 1.0000 | TOPICAL_CREAM | Freq: Every day | VAGINAL | 0 refills | Status: DC

## 2018-05-27 NOTE — Telephone Encounter (Signed)
Scheduled for virtual

## 2018-05-27 NOTE — Telephone Encounter (Signed)
Left message to call back for more information. 

## 2018-05-27 NOTE — Progress Notes (Signed)
Patient ID: Shannon [name], female   DOB: 06/04/1965, 53 y.o.   MRN: [id number] Virtual Visit via [employer] Note  This visit type was conducted due to national recommendations for restrictions regarding the COVID-19 pandemic (e.g. social distancing).  This format is felt to be most appropriate for this patient at this time.  All issues noted in this document were discussed and addressed.  No physical exam was performed (except for noted visual exam findings with Video Visits).   I attempted to connect with Terisa Starr on 05/27/18 at 12:00 PM EDT by a video enabled telemedicine application.  Unable to connect to the visit was converted to a telephone visit.  I verified that I wa speaking with the correct person using two identifiers. Location patient: home Location provider: work Persons participating in the virtual visit: patient, provider  I discussed the limitations, risks, security and privacy concerns of performing an evaluation and management service by telephone and the availability of in person appointments. The patient expressed understanding and agreed to proceed.  Interactive audio and video telecommunications were attempted between this provider and patient, however failed, due to patient having technical difficulties.  We continued and completed visit with audio only.   Reason for visit: acute visit.   HPI: She reports that last night she noticed she felt "wet" - underpants. Questionable discharge.  This am - wet again.  Some yellowish color - questionable discharge.  No blood.  No period in over two years.  No fever.  No abdominal pain.  No nausea or vomiting.  Bowels moving.  No diarrhea.  She has been cleaning out the garage and her attic.  Some low back pain.  She related to increased activity and cleaning.  No significant back pain now.  She did report her side starting hurting - under hip bone.  No tenderness to palpation.  Not constant and not severe.  No injury.  No hematuria.  She  has noticed if she gets hot and sweaty, will notice a little burning at times.  Handling stress.  Daughter is home from school.   This is going well.  Staying in.  No known COVID exposure.  No fever.  No cough or chest congestion.  No sob.     ROS: See pertinent positives and negatives per HPI.  Past Medical History:  Diagnosis Date  . Anemia   . Anxiety   . Breast pain, right 11/07/2012  . Breathing difficulty 06/20/2014  . Environmental allergies   . GERD (gastroesophageal reflux disease)   . Hiatal hernia    small  . History of migraine headaches   . Hyperthyroidism    s/p ablation  . Vaginitis 01/16/2013    Past Surgical History:  Procedure Laterality Date  . BREAST BIOPSY Right 12/05/2015   neg  . CESAREAN SECTION  11/98   Dr Roena Malady  . CHOLECYSTECTOMY  11/05   Dr Pat Patrick  . JOINT REPLACEMENT Left 2017   hip  . NASAL SINUS SURGERY  11/93  . SEPTOPLASTY  5/90   Dr Rossie Muskrat  . TUBAL LIGATION  02/1998  . UMBILICAL HERNIA REPAIR  02/1998    Family History  Problem Relation Age of Onset  . Heart disease Father   . Hypertension Father   . Thyroid disease Father   . Hypercholesterolemia Father   . Kidney cancer Father   . Leukemia Father        hairy cell  . Hypercholesterolemia Mother   . Thyroid disease Mother   .  Breast cancer Mother 62  . Breast cancer Other        maternal great grandmother  . Heart disease Maternal Grandfather        MI - age 40  . Prostate cancer Paternal Grandfather   . Pancreatic cancer Paternal Grandfather   . Lung cancer Paternal Grandfather   . Bladder Cancer Neg Hx     SOCIAL HX: reviewed.    Current Outpatient Medications:  .  acetaminophen (TYLENOL) 500 MG tablet, Take 1,000 mg by mouth every 6 (six) hours as needed for moderate pain., Disp: , Rfl:  .  ALPRAZolam (XANAX) 0.25 MG tablet, TAKE ONE TABLET AT BEDTIME AS NEEDED FORANXIETY, Disp: 30 tablet, Rfl: 1 .  amoxicillin (AMOXIL) 500 MG capsule, , Disp: , Rfl:  .   clindamycin (CLEOCIN) 2 % vaginal cream, Place 1 Applicatorful vaginally at bedtime. For 7 nights, Disp: 40 g, Rfl: 0 .  EPINEPHrine 0.3 mg/0.3 mL IJ SOAJ injection, , Disp: , Rfl:  .  famotidine (PEPCID) 40 MG tablet, Take by mouth., Disp: , Rfl:  .  omeprazole (PRILOSEC) 40 MG capsule, Take 40 mg by mouth daily., Disp: , Rfl:  .  SUMAtriptan (IMITREX) 100 MG tablet, , Disp: , Rfl:   EXAM:  GENERAL: alert.  Sounds to be in no acute distress.  Answering questions appropriately.    PSYCH/NEURO: pleasant and cooperative, no obvious depression or anxiety, speech and thought processing grossly intact  ASSESSMENT AND PLAN:  Discussed the following assessment and plan:  RLQ abdominal pain - Plan: Urine Culture, Urinalysis, Routine w reflex microscopic, Urinalysis, Routine w reflex microscopic, Urine Culture, CANCELED: Urinalysis, Routine w reflex microscopic  Anxiety  Gastroesophageal reflux disease without esophagitis  Vaginal discharge  Anxiety Doing well.  Stable.  Takes xanax.   Gastroesophageal reflux disease without esophagitis Controlled.   Vaginal discharge Question of vaginal discharge.  Described feeling wet with some yellowish ? Discharge.  No blood.  Question if atrophy/G.vag.  No significant abdominal pain.  Some irritation of tissue with urination.  No fever.  Will check urinalysis.  Follow.  Sent in rx for cleocin vaginal cream. Take if persistent.  Follow symptoms.  Notify me if persistent.      I discussed the assessment and treatment plan with the patient. The patient was provided an opportunity to ask questions and all were answered. The patient agreed with the plan and demonstrated an understanding of the instructions.   The patient was advised to call back or seek an in-person evaluation if the symptoms worsen or if the condition fails to improve as anticipated.  I provided 30 minutes of non-face-to-face time during this encounter.   [name], MD

## 2018-05-27 NOTE — Telephone Encounter (Signed)
My chart message sent for pt update.  

## 2018-05-28 ENCOUNTER — Encounter: Payer: Self-pay | Admitting: Internal Medicine

## 2018-05-28 LAB — URINALYSIS, ROUTINE W REFLEX MICROSCOPIC
Bilirubin Urine: NEGATIVE
Glucose, UA: NEGATIVE
Hgb urine dipstick: NEGATIVE
Ketones, ur: NEGATIVE
Leukocytes,Ua: NEGATIVE
Nitrite: NEGATIVE
Protein, ur: NEGATIVE
Specific Gravity, Urine: 1.005 (ref 1.001–1.03)
pH: 7 (ref 5.0–8.0)

## 2018-05-28 LAB — URINE CULTURE
MICRO NUMBER:: 436039
Result:: NO GROWTH
SPECIMEN QUALITY:: ADEQUATE

## 2018-05-29 ENCOUNTER — Encounter: Payer: Self-pay | Admitting: Internal Medicine

## 2018-05-30 ENCOUNTER — Encounter: Payer: Self-pay | Admitting: Internal Medicine

## 2018-05-30 DIAGNOSIS — N898 Other specified noninflammatory disorders of vagina: Secondary | ICD-10-CM | POA: Insufficient documentation

## 2018-05-30 NOTE — Assessment & Plan Note (Signed)
Doing well.  Stable.  Takes xanax.

## 2018-05-30 NOTE — Assessment & Plan Note (Signed)
Question of vaginal discharge.  Described feeling wet with some yellowish ? Discharge.  No blood.  Question if atrophy/G.vag.  No significant abdominal pain.  Some irritation of tissue with urination.  No fever.  Will check urinalysis.  Follow.  Sent in rx for cleocin vaginal cream. Take if persistent.  Follow symptoms.  Notify me if persistent.

## 2018-05-30 NOTE — Assessment & Plan Note (Signed)
Controlled.  

## 2018-06-01 DIAGNOSIS — J301 Allergic rhinitis due to pollen: Secondary | ICD-10-CM | POA: Diagnosis not present

## 2018-06-01 DIAGNOSIS — J3089 Other allergic rhinitis: Secondary | ICD-10-CM | POA: Diagnosis not present

## 2018-06-08 DIAGNOSIS — J3089 Other allergic rhinitis: Secondary | ICD-10-CM | POA: Diagnosis not present

## 2018-06-08 DIAGNOSIS — J301 Allergic rhinitis due to pollen: Secondary | ICD-10-CM | POA: Diagnosis not present

## 2018-06-11 ENCOUNTER — Other Ambulatory Visit: Payer: Self-pay | Admitting: Internal Medicine

## 2018-06-14 NOTE — Telephone Encounter (Signed)
Refilled: 02/13/2018 Last OV: 05/27/2018 Next OV: 09/06/2018

## 2018-06-14 NOTE — Telephone Encounter (Signed)
rx ok'd for xanax #30 with one refill.  Data base checked.

## 2018-06-15 DIAGNOSIS — J3089 Other allergic rhinitis: Secondary | ICD-10-CM | POA: Diagnosis not present

## 2018-06-15 DIAGNOSIS — J301 Allergic rhinitis due to pollen: Secondary | ICD-10-CM | POA: Diagnosis not present

## 2018-06-22 DIAGNOSIS — J3089 Other allergic rhinitis: Secondary | ICD-10-CM | POA: Diagnosis not present

## 2018-06-22 DIAGNOSIS — J301 Allergic rhinitis due to pollen: Secondary | ICD-10-CM | POA: Diagnosis not present

## 2018-06-29 DIAGNOSIS — J3089 Other allergic rhinitis: Secondary | ICD-10-CM | POA: Diagnosis not present

## 2018-06-29 DIAGNOSIS — J301 Allergic rhinitis due to pollen: Secondary | ICD-10-CM | POA: Diagnosis not present

## 2018-07-05 ENCOUNTER — Telehealth: Payer: Self-pay | Admitting: *Deleted

## 2018-07-05 ENCOUNTER — Other Ambulatory Visit: Payer: Self-pay | Admitting: Pain Medicine

## 2018-07-05 DIAGNOSIS — M47816 Spondylosis without myelopathy or radiculopathy, lumbar region: Secondary | ICD-10-CM

## 2018-07-05 DIAGNOSIS — Z01818 Encounter for other preprocedural examination: Secondary | ICD-10-CM | POA: Insufficient documentation

## 2018-07-05 NOTE — Telephone Encounter (Signed)
Patient does not take sedation.  Instructions to get ordered Covid 19 test, wear a mask and how to enter the building. Patient verbalizes u/o information

## 2018-07-05 NOTE — Telephone Encounter (Signed)
Dr Dossie Arbour ordering right facet block, I have let [employer] know so she can get authorization.

## 2018-07-05 NOTE — Telephone Encounter (Signed)
Spoke with patient, she is c/o pain in her right glute approx midway between hip and buttocks crack.  She has changed insurance and we are going to check on the need for PA and then get her in line to get procedure as soon as we can.  She does understand that there is a back log from the time that we have been unable to see patient's.

## 2018-07-05 NOTE — Telephone Encounter (Signed)
I need to know what procedure to get auth for

## 2018-07-06 ENCOUNTER — Other Ambulatory Visit: Payer: Self-pay | Admitting: Pain Medicine

## 2018-07-06 DIAGNOSIS — J3089 Other allergic rhinitis: Secondary | ICD-10-CM | POA: Diagnosis not present

## 2018-07-06 DIAGNOSIS — J301 Allergic rhinitis due to pollen: Secondary | ICD-10-CM | POA: Diagnosis not present

## 2018-07-09 DIAGNOSIS — J301 Allergic rhinitis due to pollen: Secondary | ICD-10-CM | POA: Diagnosis not present

## 2018-07-12 ENCOUNTER — Other Ambulatory Visit: Payer: Self-pay

## 2018-07-12 ENCOUNTER — Other Ambulatory Visit
Admission: RE | Admit: 2018-07-12 | Discharge: 2018-07-12 | Disposition: A | Discharge status: Home / Self Care | Payer: BC Managed Care – PPO | Source: Ambulatory Visit | Attending: Pain Medicine | Admitting: Pain Medicine

## 2018-07-12 DIAGNOSIS — Z1159 Encounter for screening for other viral diseases: Secondary | ICD-10-CM | POA: Insufficient documentation

## 2018-07-12 DIAGNOSIS — J3089 Other allergic rhinitis: Secondary | ICD-10-CM | POA: Diagnosis not present

## 2018-07-13 DIAGNOSIS — J3081 Allergic rhinitis due to animal (cat) (dog) hair and dander: Secondary | ICD-10-CM | POA: Diagnosis not present

## 2018-07-13 DIAGNOSIS — J301 Allergic rhinitis due to pollen: Secondary | ICD-10-CM | POA: Diagnosis not present

## 2018-07-13 DIAGNOSIS — J3089 Other allergic rhinitis: Secondary | ICD-10-CM | POA: Diagnosis not present

## 2018-07-13 LAB — NOVEL CORONAVIRUS, NAA (HOSP ORDER, SEND-OUT TO REF LAB; TAT 18-24 HRS): SARS-CoV-2, NAA: NOT DETECTED

## 2018-07-15 ENCOUNTER — Other Ambulatory Visit: Payer: Self-pay

## 2018-07-15 ENCOUNTER — Ambulatory Visit (HOSPITAL_BASED_OUTPATIENT_CLINIC_OR_DEPARTMENT_OTHER): Payer: BC Managed Care – PPO | Admitting: Pain Medicine

## 2018-07-15 ENCOUNTER — Encounter: Payer: Self-pay | Admitting: Pain Medicine

## 2018-07-15 ENCOUNTER — Ambulatory Visit
Admission: RE | Admit: 2018-07-15 | Discharge: 2018-07-15 | Disposition: A | Discharge status: Home / Self Care | Payer: BC Managed Care – PPO | Source: Ambulatory Visit | Attending: Pain Medicine | Admitting: Pain Medicine

## 2018-07-15 VITALS — BP 148/82 | HR 82 | Temp 98.9°F | Resp 16 | Ht 62.0 in | Wt 127.0 lb

## 2018-07-15 DIAGNOSIS — M545 Low back pain, unspecified: Secondary | ICD-10-CM

## 2018-07-15 DIAGNOSIS — M47816 Spondylosis without myelopathy or radiculopathy, lumbar region: Secondary | ICD-10-CM

## 2018-07-15 DIAGNOSIS — M47817 Spondylosis without myelopathy or radiculopathy, lumbosacral region: Secondary | ICD-10-CM | POA: Insufficient documentation

## 2018-07-15 DIAGNOSIS — M5137 Other intervertebral disc degeneration, lumbosacral region: Secondary | ICD-10-CM

## 2018-07-15 DIAGNOSIS — G8929 Other chronic pain: Secondary | ICD-10-CM

## 2018-07-15 MED ORDER — TRIAMCINOLONE ACETONIDE 40 MG/ML IJ SUSP
40.0000 mg | Freq: Once | INTRAMUSCULAR | Status: AC
  Administered 2018-07-15: 40 mg
  Filled 2018-07-15: qty 1

## 2018-07-15 MED ORDER — LIDOCAINE HCL 2 % IJ SOLN
20.0000 mL | Freq: Once | INTRAMUSCULAR | Status: AC
  Administered 2018-07-15: 400 mg

## 2018-07-15 MED ORDER — ROPIVACAINE HCL 2 MG/ML IJ SOLN
9.0000 mL | Freq: Once | INTRAMUSCULAR | Status: AC
  Administered 2018-07-15: 9 mL via PERINEURAL
  Filled 2018-07-15: qty 10

## 2018-07-15 NOTE — Progress Notes (Signed)
Patient's Name: Shannon [name]  MRN: [id number]  Referring Provider: [name], MD  DOB: [birth year]  PCP: [name], MD  DOS: 07/15/2018  Note by: Gaspar Cola, MD  Service setting: Ambulatory outpatient  Specialty: Interventional Pain Management  Patient type: Established  Location: [facility] (AMB) Pain Management Facility  Visit type: Interventional Procedure   Primary Reason for Visit: Interventional Pain Management Treatment. CC: Back Pain (right, lower)  Procedure:          Anesthesia, Analgesia, Anxiolysis:  Type: Lumbar Facet, Medial Branch Block(s)          Primary Purpose: Palliative Region: Posterolateral Lumbosacral Spine Level: L2, L3, L4, L5, & S1 Medial Branch Level(s). Injecting these levels blocks the L3-4, L4-5, and L5-S1 lumbar facet joints. Laterality: Right  Type: Local Anesthesia Indication(s): Analgesia         Route: Infiltration ([place]/IM) IV Access: Declined Sedation: Declined  Local Anesthetic: Lidocaine 1-2%  Position: Prone   Indications: 1. Lumbar Facet syndrome (Bilateral) (R>L)   2. Spondylosis without myelopathy or radiculopathy, lumbosacral region   3. Lumbar facet hypertrophy   4. DDD (degenerative disc disease), lumbosacral   5. Chronic low back pain (Primary Area of Pain) (Bilateral) (R>L)    Pain Score: Pre-procedure: 3 /10 Post-procedure: 3 /10  Pre-op Assessment:  Shannon [name] is a 53 y.o. (year old), female patient, seen today for interventional treatment. She  has a past surgical history that includes Septoplasty (5/90); Nasal sinus surgery (11/93); Cesarean section (11/98); Tubal ligation (02/1998); Umbilical hernia repair (02/1998); Cholecystectomy (11/05); Joint replacement (Left, 2017); and Breast biopsy (Right, 12/05/2015). Shannon [name] has a current medication list which includes the following prescription(s): acetaminophen, alprazolam, epinephrine, famotidine, omeprazole, sumatriptan, amoxicillin, and clindamycin. Her primarily  concern today is the Back Pain (right, lower)  Initial Vital Signs:  Pulse/HCG Rate: 82ECG Heart Rate: 77 Temp: 98.9 F (37.2 C) Resp: 14 BP: 133/85 SpO2: 100 %  BMI: Estimated body mass index is 23.23 kg/m as calculated from the following:   Height as of this encounter: 5\' 2"  (1.575 m).   Weight as of this encounter: 127 lb (57.6 kg).  Risk Assessment: Allergies: Reviewed. She is allergic to codeine; decongest-aid [pseudoephedrine]; flagyl [metronidazole]; and sulfa antibiotics.  Allergy Precautions: None required Coagulopathies: Reviewed. None identified.  Blood-thinner therapy: None at this time Active Infection(s): Reviewed. None identified. Shannon [name] is afebrile  Site Confirmation: Shannon [name] was asked to confirm the procedure and laterality before marking the site Procedure checklist: Completed Consent: Before the procedure and under the influence of no sedative(s), amnesic(s), or anxiolytics, the patient was informed of the treatment options, risks and possible complications. To fulfill our ethical and legal obligations, as recommended by the American Medical Association's Code of Ethics, I have informed the patient of my clinical impression; the nature and purpose of the treatment or procedure; the risks, benefits, and possible complications of the intervention; the alternatives, including doing nothing; the risk(s) and benefit(s) of the alternative treatment(s) or procedure(s); and the risk(s) and benefit(s) of doing nothing. The patient was provided information about the general risks and possible complications associated with the procedure. These may include, but are not limited to: failure to achieve desired goals, infection, bleeding, organ or nerve damage, allergic reactions, paralysis, and death. In addition, the patient was informed of those risks and complications associated to Spine-related procedures, such as failure to decrease pain; infection (i.e.: Meningitis, epidural  or intraspinal abscess); bleeding (i.e.: epidural hematoma, subarachnoid hemorrhage, or any other  type of intraspinal or peri-dural bleeding); organ or nerve damage (i.e.: Any type of peripheral nerve, nerve root, or spinal cord injury) with subsequent damage to sensory, motor, and/or autonomic systems, resulting in permanent pain, numbness, and/or weakness of one or several areas of the body; allergic reactions; (i.e.: anaphylactic reaction); and/or death. Furthermore, the patient was informed of those risks and complications associated with the medications. These include, but are not limited to: allergic reactions (i.e.: anaphylactic or anaphylactoid reaction(s)); adrenal axis suppression; blood sugar elevation that in diabetics may result in ketoacidosis or comma; water retention that in patients with history of congestive heart failure may result in shortness of breath, pulmonary edema, and decompensation with resultant heart failure; weight gain; swelling or edema; medication-induced neural toxicity; particulate matter embolism and blood vessel occlusion with resultant organ, and/or nervous system infarction; and/or aseptic necrosis of one or more joints. Finally, the patient was informed that Medicine is not an exact science; therefore, there is also the possibility of unforeseen or unpredictable risks and/or possible complications that may result in a catastrophic outcome. The patient indicated having understood very clearly. We have given the patient no guarantees and we have made no promises. Enough time was given to the patient to ask questions, all of which were answered to the patient's satisfaction. Shannon [name] has indicated that she wanted to continue with the procedure. Attestation: I, the ordering provider, attest that I have discussed with the patient the benefits, risks, side-effects, alternatives, likelihood of achieving goals, and potential problems during recovery for the procedure that I have  provided informed consent. Date   Time: 07/15/2018 12:14 PM  Pre-Procedure Preparation:  Monitoring: As per clinic protocol. Respiration, ETCO2, SpO2, BP, heart rate and rhythm monitor placed and checked for adequate function Safety Precautions: Patient was assessed for positional comfort and pressure points before starting the procedure. Time-out: I initiated and conducted the "Time-out" before starting the procedure, as per protocol. The patient was asked to participate by confirming the accuracy of the "Time Out" information. Verification of the correct person, site, and procedure were performed and confirmed by me, the nursing staff, and the patient. "Time-out" conducted as per Joint Commission's Universal Protocol (UP.01.01.01). Time: 1311  Description of Procedure:          Laterality: Right Levels:  L2, L3, L4, L5, & S1 Medial Branch Level(s) Area Prepped: Posterior Lumbosacral Region Prepping solution: DuraPrep (Iodine Povacrylex [0.7% available iodine] and Isopropyl Alcohol, 74% w/w) Safety Precautions: Aspiration looking for blood return was conducted prior to all injections. At no point did we inject any substances, as a needle was being advanced. Before injecting, the patient was told to immediately notify me if she was experiencing any new onset of "ringing in the ears, or metallic taste in the mouth". No attempts were made at seeking any paresthesias. Safe injection practices and needle disposal techniques used. Medications properly checked for expiration dates. SDV (single dose vial) medications used. After the completion of the procedure, all disposable equipment used was discarded in the proper designated medical waste containers. Local Anesthesia: Protocol guidelines were followed. The patient was positioned over the fluoroscopy table. The area was prepped in the usual manner. The time-out was completed. The target area was identified using fluoroscopy. A 12-in long, straight, sterile  hemostat was used with fluoroscopic guidance to locate the targets for each level blocked. Once located, the skin was marked with an approved surgical skin marker. Once all sites were marked, the skin (epidermis, dermis, and hypodermis),  as well as deeper tissues (fat, connective tissue and muscle) were infiltrated with a small amount of a short-acting local anesthetic, loaded on a 10cc syringe with a 25G, 1.5-in  Needle. An appropriate amount of time was allowed for local anesthetics to take effect before proceeding to the next step. Local Anesthetic: Lidocaine 2.0% The unused portion of the local anesthetic was discarded in the proper designated containers. Technical explanation of process:  L2 Medial Branch Nerve Block (MBB): The target area for the L2 medial branch is at the junction of the postero-lateral aspect of the superior articular process and the superior, posterior, and medial edge of the transverse process of L3. Under fluoroscopic guidance, a Quincke needle was inserted until contact was made with os over the superior postero-lateral aspect of the pedicular shadow (target area). After negative aspiration for blood, 0.5 mL of the nerve block solution was injected without difficulty or complication. The needle was removed intact. L3 Medial Branch Nerve Block (MBB): The target area for the L3 medial branch is at the junction of the postero-lateral aspect of the superior articular process and the superior, posterior, and medial edge of the transverse process of L4. Under fluoroscopic guidance, a Quincke needle was inserted until contact was made with os over the superior postero-lateral aspect of the pedicular shadow (target area). After negative aspiration for blood, 0.5 mL of the nerve block solution was injected without difficulty or complication. The needle was removed intact. L4 Medial Branch Nerve Block (MBB): The target area for the L4 medial branch is at the junction of the postero-lateral  aspect of the superior articular process and the superior, posterior, and medial edge of the transverse process of L5. Under fluoroscopic guidance, a Quincke needle was inserted until contact was made with os over the superior postero-lateral aspect of the pedicular shadow (target area). After negative aspiration for blood, 0.5 mL of the nerve block solution was injected without difficulty or complication. The needle was removed intact. L5 Medial Branch Nerve Block (MBB): The target area for the L5 medial branch is at the junction of the postero-lateral aspect of the superior articular process and the superior, posterior, and medial edge of the sacral ala. Under fluoroscopic guidance, a Quincke needle was inserted until contact was made with os over the superior postero-lateral aspect of the pedicular shadow (target area). After negative aspiration for blood, 0.5 mL of the nerve block solution was injected without difficulty or complication. The needle was removed intact. S1 Medial Branch Nerve Block (MBB): The target area for the S1 medial branch is at the posterior and inferior 6 o'clock position of the L5-S1 facet joint. Under fluoroscopic guidance, the Quincke needle inserted for the L5 MBB was redirected until contact was made with os over the inferior and postero aspect of the sacrum, at the 6 o' clock position under the L5-S1 facet joint (Target area). After negative aspiration for blood, 0.5 mL of the nerve block solution was injected without difficulty or complication. The needle was removed intact.  Nerve block solution: 0.2% PF-Ropivacaine + Triamcinolone (40 mg/mL) diluted to a final concentration of 4 mg of Triamcinolone/mL of Ropivacaine The unused portion of the solution was discarded in the proper designated containers. Procedural Needles: 22-gauge, 3.5-inch, Quincke needles used for all levels.  Once the entire procedure was completed, the treated area was cleaned, making sure to leave some  of the prepping solution back to take advantage of its long term bactericidal properties.   Illustration of the posterior  view of the lumbar spine and the posterior neural structures. Laminae of L2 through S1 are labeled. DPRL5, dorsal primary ramus of L5; DPRS1, dorsal primary ramus of S1; DPR3, dorsal primary ramus of L3; FJ, facet (zygapophyseal) joint L3-L4; I, inferior articular process of L4; LB1, lateral branch of dorsal primary ramus of L1; IAB, inferior articular branches from L3 medial branch (supplies L4-L5 facet joint); IBP, intermediate branch plexus; MB3, medial branch of dorsal primary ramus of L3; NR3, third lumbar nerve root; S, superior articular process of L5; SAB, superior articular branches from L4 (supplies L4-5 facet joint also); TP3, transverse process of L3.  Vitals:   07/15/18 1213 07/15/18 1307 07/15/18 1312 07/15/18 1319  BP: 133/85 138/85 (!) 141/78 (!) 148/82  Pulse: 82     Resp: 14 16 12 16   Temp: 98.9 F (37.2 C)     TempSrc: Oral     SpO2: 100% 99% 100% 100%  Weight: 127 lb (57.6 kg)     Height: 5\' 2"  (1.575 m)        Start Time: 1311 hrs. End Time:   hrs.  Imaging Guidance (Spinal):          Type of Imaging Technique: Fluoroscopy Guidance (Spinal) Indication(s): Assistance in needle guidance and placement for procedures requiring needle placement in or near specific anatomical locations not easily accessible without such assistance. Exposure Time: Please see nurses notes. Contrast: None used. Fluoroscopic Guidance: I was personally present during the use of fluoroscopy. "Tunnel Vision Technique" used to obtain the best possible view of the target area. Parallax error corrected before commencing the procedure. "Direction-depth-direction" technique used to introduce the needle under continuous pulsed fluoroscopy. Once target was reached, antero-posterior, oblique, and lateral fluoroscopic projection used confirm needle placement in all planes. Images  permanently stored in EMR. Interpretation: No contrast injected. I personally interpreted the imaging intraoperatively. Adequate needle placement confirmed in multiple planes. Permanent images saved into the patient's record.  Antibiotic Prophylaxis:   Anti-infectives (From admission, onward)   None     Indication(s): None identified  Post-operative Assessment:  Post-procedure Vital Signs:  Pulse/HCG Rate: 8279 Temp: 98.9 F (37.2 C) Resp: 16 BP: (!) 148/82 SpO2: 100 %  EBL: None  Complications: No immediate post-treatment complications observed by team, or reported by patient.  Note: The patient tolerated the entire procedure well. A repeat set of vitals were taken after the procedure and the patient was kept under observation following institutional policy, for this type of procedure. Post-procedural neurological assessment was performed, showing return to baseline, prior to discharge. The patient was provided with post-procedure discharge instructions, including a section on how to identify potential problems. Should any problems arise concerning this procedure, the patient was given instructions to immediately contact us, at any time, without hesitation. In any case, we plan to contact the patient by telephone for a follow-up status report regarding this interventional procedure.  Comments:  No additional relevant information.  Plan of Care  Orders:  Orders Placed This Encounter  Procedures   LUMBAR FACET(MEDIAL BRANCH NERVE BLOCK) MBNB    Scheduling Instructions:     Side: Right-sided     Level: L3-4, L4-5, & L5-S1 Facets (L2, L3, L4, L5, & S1 Medial Branch Nerves)     Sedation: Patient's choice     Timeframe: Today    Order Specific Question:   Where will this procedure be performed?    Answer:   [facility]   DG PAIN [facility] C-ARM 1-60 MIN NO  REPORT    Intraoperative interpretation by procedural physician at [place].    Standing Status:    Standing    Number of Occurrences:   1    Order Specific Question:   Reason for exam:    Answer:   Assistance in needle guidance and placement for procedures requiring needle placement in or near specific anatomical locations not easily accessible without such assistance.   Provider attestation of informed consent for procedure/surgical case    I, the ordering provider, attest that I have discussed with the patient the benefits, risks, side effects, alternatives, likelihood of achieving goals and potential problems during recovery for the procedure that I have provided informed consent.    Standing Status:   Standing    Number of Occurrences:   1   Informed Consent Details: Transcribe to consent form and obtain patient signature    Standing Status:   Standing    Number of Occurrences:   1    Order Specific Question:   Procedure    Answer:   Lumbar facet block (medial branch block) under fluoroscopic guidance. (See notes for levels and laterality.)    Order Specific Question:   Surgeon    Answer:   Baker Moronta A. Dossie Arbour, MD    Order Specific Question:   Indication/Reason    Answer:   Low back pain with or without lower extremity pain.   Medications ordered for procedure: Meds ordered this encounter  Medications   lidocaine (XYLOCAINE) 2 % (with pres) injection 400 mg   ropivacaine (PF) 2 mg/mL (0.2%) (NAROPIN) injection 9 mL   triamcinolone acetonide (KENALOG-40) injection 40 mg   Medications administered: We administered lidocaine, ropivacaine (PF) 2 mg/mL (0.2%), and triamcinolone acetonide.  See the medical record for exact dosing, route, and time of administration.  Disposition: Discharge home  Discharge Date & Time: 07/15/2018; 1319 hrs.   Follow-up plan:   Return in about 2 weeks (around 07/29/2018) for (VV), E/M (PP).     Recent Visits Date Type Provider Dept  05/05/18 Office Visit Milinda Pointer, MD [facility]  Showing recent visits within past 90 days and  meeting all other requirements   Today's Visits Date Type Provider Dept  07/15/18 Procedure visit Milinda Pointer, MD [facility]  Showing today's visits and meeting all other requirements   Future Appointments Date Type Provider Dept  08/02/18 Appointment Milinda Pointer, MD [facility]  Showing future appointments within next 90 days and meeting all other requirements   Primary Care Physician: [name], MD Location: [facility] Note by: Gaspar Cola, MD Date: 07/15/2018; Time: 1:48 PM  Disclaimer:  Medicine is not an Chief Strategy Officer. The only guarantee in medicine is that nothing is guaranteed. It is important to note that the decision to proceed with this intervention was based on the information collected from the patient. The Data and conclusions were drawn from the patient's questionnaire, the interview, and the physical examination. Because the information was provided in large part by the patient, it cannot be guaranteed that it has not been purposely or unconsciously manipulated. Every effort has been made to obtain as much relevant data as possible for this evaluation. It is important to note that the conclusions that lead to this procedure are derived in large part from the available data. Always take into account that the treatment will also be dependent on availability of resources and existing treatment guidelines, considered by other Pain Management Practitioners as  being common knowledge and practice, at the time of the intervention. For Medico-Legal purposes, it is also important to point out that variation in procedural techniques and pharmacological choices are the acceptable norm. The indications, contraindications, technique, and results of the above procedure should only be interpreted and judged by a Board-Certified Interventional Pain Specialist with extensive familiarity and expertise in the same exact  procedure and technique.

## 2018-07-15 NOTE — Patient Instructions (Signed)

## 2018-07-15 NOTE — Progress Notes (Signed)
Safety precautions to be maintained throughout the outpatient stay will include: orient to surroundings, keep bed in low position, maintain call bell within reach at all times, provide assistance with transfer out of bed and ambulation.  

## 2018-07-16 ENCOUNTER — Telehealth: Payer: Self-pay

## 2018-07-16 NOTE — Telephone Encounter (Signed)
Post procedure phone call.  Patient states she is doing well.  

## 2018-07-20 DIAGNOSIS — J301 Allergic rhinitis due to pollen: Secondary | ICD-10-CM | POA: Diagnosis not present

## 2018-07-20 DIAGNOSIS — J3089 Other allergic rhinitis: Secondary | ICD-10-CM | POA: Diagnosis not present

## 2018-07-29 ENCOUNTER — Encounter: Payer: Self-pay | Admitting: Pain Medicine

## 2018-07-29 DIAGNOSIS — J301 Allergic rhinitis due to pollen: Secondary | ICD-10-CM | POA: Diagnosis not present

## 2018-07-29 DIAGNOSIS — J3089 Other allergic rhinitis: Secondary | ICD-10-CM | POA: Diagnosis not present

## 2018-08-01 NOTE — Progress Notes (Signed)
Pain Management Virtual Encounter Note - Virtual Visit via Telephone Telehealth (real-time audio visits between healthcare provider and patient).   Patient's Phone No. & Preferred Pharmacy:  [phone] (home); [phone] (mobile); (Preferred) [phone] [email or web address]  [facility] - [place], [place] - [address] [place] [address] Phone: [phone] Fax: [phone]    Pre-screening note:  Our staff contacted Shannon [name] and offered her an "in person", "face-to-face" appointment versus a telephone encounter. She indicated preferring the telephone encounter, at this time.   Reason for Virtual Visit: COVID-19*  Social distancing based on CDC and AMA recommendations.   I contacted Shannon [name] on 08/02/2018 via telephone.      I clearly identified myself as Gaspar Cola, MD. I verified that I was speaking with the correct person using two identifiers (Name: Shannon [name], and date of birth: Aug 27, 1965).  Advanced Informed Consent I sought verbal advanced consent from Shannon [name] for virtual visit interactions. I informed Shannon [name] of possible security and privacy concerns, risks, and limitations associated with providing "not-in-person" medical evaluation and management services. I also informed Shannon [name] of the availability of "in-person" appointments. Finally, I informed her that there would be a charge for the virtual visit and that she could be  personally, fully or partially, financially responsible for it. Shannon [name] expressed understanding and agreed to proceed.   Historic Elements   Shannon [name] is a 53 y.o. year old, female patient evaluated today after her last encounter by our practice on 07/16/2018. Shannon [name]  has a past medical history of Anemia, Anxiety, Breast pain, right (11/07/2012), Breathing difficulty (06/20/2014), Environmental allergies, GERD (gastroesophageal reflux disease), Hiatal hernia, History of migraine  headaches, Hyperthyroidism, and Vaginitis (01/16/2013). She also  has a past surgical history that includes Septoplasty (5/90); Nasal sinus surgery (11/93); Cesarean section (11/98); Tubal ligation (02/1998); Umbilical hernia repair (02/1998); Cholecystectomy (11/05); Joint replacement (Left, 2017); and Breast biopsy (Right, 12/05/2015). Shannon [name] has a current medication list which includes the following prescription(s): acetaminophen, alprazolam, epinephrine, famotidine, omeprazole, and sumatriptan. She  reports that she quit smoking about 18 years ago. She has never used smokeless tobacco. She reports that she does not drink alcohol or use drugs. Shannon [name] is allergic to codeine; decongest-aid [pseudoephedrine]; flagyl [metronidazole]; and sulfa antibiotics.   HPI  Today, she is being contacted for a post-procedure assessment.  The patient indicates that last Thursday she had already attained 90% relief of the pain but by Saturday, she was experiencing absolutely no pain.  We will continue to follow-up and she has indicated she will call us if she needs this again.  Post-Procedure Evaluation  Procedure: Diagnostic right-sided lumbar facet block #3 under fluoroscopic guidance, no sedation Pre-procedure pain level:  3/10 Post-procedure: 3/10          Sedation: None.  Effectiveness during initial hour after procedure(Ultra-Short Term Relief): 100 %   Local anesthetic used: Long-acting (4-6 hours) Effectiveness: Defined as any analgesic benefit obtained secondary to the administration of local anesthetics. This carries significant diagnostic value as to the etiological location, or anatomical origin, of the pain. Duration of benefit is expected to coincide with the duration of the local anesthetic used.  Effectiveness during initial 4-6 hours after procedure(Short-Term Relief): 100 %   Long-term benefit: Defined as any relief past the pharmacologic duration of the local anesthetics.  Effectiveness past  the initial 6 hours after procedure(Long-Term Relief): 90 %   Current benefits: Defined  as benefit that persist at this time.   Analgesia:  100% better.  She indicates that currently she is experiencing no pain. Function: Shannon [name] reports improvement in function ROM: Shannon [name] reports improvement in ROM  Pharmacotherapy Assessment  Analgesic: None from our practice.   Monitoring: Pharmacotherapy: No side-effects or adverse reactions reported. [place] PMP: PDMP reviewed during this encounter.       Compliance: No problems identified. Effectiveness: Clinically acceptable. Plan: Refer to "POC".  Pertinent Labs   SAFETY SCREENING Profile Lab Results  Component Value Date   SARSCOV2NAA NOT DETECTED 07/12/2018   COVIDSOURCE NASOPHARYNGEAL 07/12/2018   Renal Function Lab Results  Component Value Date   BUN 12 03/08/2018   CREATININE 0.73 03/08/2018   GFRAA >60 05/02/2014   GFRNONAA >60 05/02/2014   Hepatic Function Lab Results  Component Value Date   AST 25 03/08/2018   ALT 26 03/08/2018   ALBUMIN 4.3 03/08/2018   UDS No results found for: SUMMARY Note: Above Lab results reviewed.  Recent imaging  DG PAIN CLINIC C-ARM 1-60 MIN NO REPORT Fluoro was used, but no Radiologist interpretation will be provided.  Please refer to "NOTES" tab for provider progress note.  Assessment  The primary encounter diagnosis was Chronic pain syndrome. Diagnoses of Chronic low back pain (Primary Area of Pain) (Bilateral) (R>L), Chronic sacroiliac joint pain (Secondary source of pain) (intermittent) (Left), Chronic lumbar radicular pain (Tertiary source of pain) (intermittent) (L4 Dermatome) (Left), and Chronic sacroiliac joint pain (Right) were also pertinent to this visit.  Plan of Care  I have discontinued Shannon [name]. Germain's amoxicillin and clindamycin. I am also having her maintain her omeprazole, acetaminophen, EPINEPHrine, famotidine, SUMAtriptan, and ALPRAZolam.  Pharmacotherapy  (Medications Ordered): No orders of the defined types were placed in this encounter.  Orders:  Orders Placed This Encounter  Procedures  . SACROILIAC JOINT INJECTION    For low back pain and groin pain.    Standing Status:   Standing    Number of Occurrences:   1    Standing Expiration Date:   08/02/2019    Scheduling Instructions:     Side: TBD     Sedation: No Sedation.     TIMEFRAME: PRN procedure. (Shannon [name] will call when needed.)    Order Specific Question:   Where will this procedure be performed?    Answer:   [facility]   Follow-up plan:   Return if symptoms worsen or fail to improve, for PRN Procedure.     Considering:   Diagnosticright lumbar facet block #2 Possible right-sided lumbar facet RFA#1 Diagnostic right-sided sacroiliac joint block#4 Possible right-sided sacroiliac joint RFA#1 Palliative right-sided L4-5LESI#2 Palliative left-sided L4-5 LESI #2 Palliative left-sided sacroiliac joint block#2 Possibleleft-sided sacroiliac joint RFA#1 Diagnostic right great toe intra-articular injection #2   Palliative PRN treatment(s):   Palliative right-sidedintra-articularsmall joint injection1stMTP(DorsalMetatarsophalangeal)#2 Palliativeright gluteal MNB Diagnostic right-sided sacroiliac joint block Palliative right-sided L4-5 lumbar epidural steroid injection Palliative left-sided sacroiliac joint block Palliative right-sided lumbar facet blocks Diagnostic right great toe intra-articular injection(s)    Recent Visits Date Type Provider Dept  07/15/18 Procedure visit Milinda Pointer, MD [facility]  05/05/18 Office Visit Milinda Pointer, MD [facility]  Showing recent visits within past 90 days and meeting all other requirements   Today's Visits Date Type Provider Dept  08/02/18 Office Visit Milinda Pointer, MD [facility]  Showing today's visits and meeting all other requirements    Future Appointments No visits were found meeting these  conditions.  Showing future appointments within next 90 days and meeting all other requirements   I discussed the assessment and treatment plan with the patient. The patient was provided an opportunity to ask questions and all were answered. The patient agreed with the plan and demonstrated an understanding of the instructions.  Patient advised to call back or seek an in-person evaluation if the symptoms or condition worsens.  Total duration of non-face-to-face encounter: 8 minutes.  Note by: Gaspar Cola, MD Date: 08/02/2018; Time: 11:50 AM  Note: This dictation was prepared with Dragon dictation. Any transcriptional errors that may result from this process are unintentional.  Disclaimer:  * Given the special circumstances of the COVID-19 pandemic, the federal government has announced that the Office for Civil Rights (OCR) will exercise its enforcement discretion and will not impose penalties on physicians using telehealth in the event of noncompliance with regulatory requirements under the [place] (HIPAA) in connection with the good faith provision of telehealth during the HKFEX-61 national public health emergency. ([place])

## 2018-08-02 ENCOUNTER — Ambulatory Visit: Payer: BC Managed Care – PPO | Attending: Pain Medicine | Admitting: Pain Medicine

## 2018-08-02 ENCOUNTER — Other Ambulatory Visit: Payer: Self-pay

## 2018-08-02 DIAGNOSIS — G894 Chronic pain syndrome: Secondary | ICD-10-CM | POA: Diagnosis not present

## 2018-08-02 DIAGNOSIS — M5416 Radiculopathy, lumbar region: Secondary | ICD-10-CM

## 2018-08-02 DIAGNOSIS — M545 Low back pain, unspecified: Secondary | ICD-10-CM

## 2018-08-02 DIAGNOSIS — G8929 Other chronic pain: Secondary | ICD-10-CM

## 2018-08-02 DIAGNOSIS — M533 Sacrococcygeal disorders, not elsewhere classified: Secondary | ICD-10-CM | POA: Diagnosis not present

## 2018-08-03 DIAGNOSIS — J3089 Other allergic rhinitis: Secondary | ICD-10-CM | POA: Diagnosis not present

## 2018-08-03 DIAGNOSIS — J301 Allergic rhinitis due to pollen: Secondary | ICD-10-CM | POA: Diagnosis not present

## 2018-08-10 DIAGNOSIS — J3089 Other allergic rhinitis: Secondary | ICD-10-CM | POA: Diagnosis not present

## 2018-08-10 DIAGNOSIS — J301 Allergic rhinitis due to pollen: Secondary | ICD-10-CM | POA: Diagnosis not present

## 2018-08-12 ENCOUNTER — Other Ambulatory Visit: Payer: Self-pay | Admitting: Internal Medicine

## 2018-08-12 DIAGNOSIS — K219 Gastro-esophageal reflux disease without esophagitis: Secondary | ICD-10-CM | POA: Diagnosis not present

## 2018-08-12 NOTE — Telephone Encounter (Signed)
Pt is out of medication.   Refilled: 06/14/2018 Last OV: 05/27/2018 Next OV: 09/06/2018

## 2018-08-12 NOTE — Telephone Encounter (Signed)
Reviewed chart.  Refill for alprazolam #30 with no refills sent in today.

## 2018-08-17 DIAGNOSIS — J301 Allergic rhinitis due to pollen: Secondary | ICD-10-CM | POA: Diagnosis not present

## 2018-08-17 DIAGNOSIS — D225 Melanocytic nevi of trunk: Secondary | ICD-10-CM | POA: Diagnosis not present

## 2018-08-17 DIAGNOSIS — D2261 Melanocytic nevi of right upper limb, including shoulder: Secondary | ICD-10-CM | POA: Diagnosis not present

## 2018-08-17 DIAGNOSIS — D2262 Melanocytic nevi of left upper limb, including shoulder: Secondary | ICD-10-CM | POA: Diagnosis not present

## 2018-08-17 DIAGNOSIS — D2272 Melanocytic nevi of left lower limb, including hip: Secondary | ICD-10-CM | POA: Diagnosis not present

## 2018-08-17 DIAGNOSIS — J3089 Other allergic rhinitis: Secondary | ICD-10-CM | POA: Diagnosis not present

## 2018-08-24 DIAGNOSIS — J301 Allergic rhinitis due to pollen: Secondary | ICD-10-CM | POA: Diagnosis not present

## 2018-08-24 DIAGNOSIS — J3089 Other allergic rhinitis: Secondary | ICD-10-CM | POA: Diagnosis not present

## 2018-08-31 DIAGNOSIS — J301 Allergic rhinitis due to pollen: Secondary | ICD-10-CM | POA: Diagnosis not present

## 2018-08-31 DIAGNOSIS — J3089 Other allergic rhinitis: Secondary | ICD-10-CM | POA: Diagnosis not present

## 2018-09-02 ENCOUNTER — Other Ambulatory Visit: Payer: Self-pay

## 2018-09-06 ENCOUNTER — Encounter: Payer: Self-pay | Admitting: Internal Medicine

## 2018-09-06 ENCOUNTER — Other Ambulatory Visit: Payer: Self-pay

## 2018-09-06 ENCOUNTER — Ambulatory Visit: Payer: BC Managed Care – PPO | Admitting: Internal Medicine

## 2018-09-06 DIAGNOSIS — F419 Anxiety disorder, unspecified: Secondary | ICD-10-CM

## 2018-09-06 DIAGNOSIS — K219 Gastro-esophageal reflux disease without esophagitis: Secondary | ICD-10-CM

## 2018-09-06 DIAGNOSIS — M25552 Pain in left hip: Secondary | ICD-10-CM

## 2018-09-06 DIAGNOSIS — E0590 Thyrotoxicosis, unspecified without thyrotoxic crisis or storm: Secondary | ICD-10-CM

## 2018-09-06 DIAGNOSIS — D649 Anemia, unspecified: Secondary | ICD-10-CM | POA: Diagnosis not present

## 2018-09-06 DIAGNOSIS — H6122 Impacted cerumen, left ear: Secondary | ICD-10-CM

## 2018-09-06 DIAGNOSIS — M533 Sacrococcygeal disorders, not elsewhere classified: Secondary | ICD-10-CM

## 2018-09-06 DIAGNOSIS — G8929 Other chronic pain: Secondary | ICD-10-CM

## 2018-09-06 MED ORDER — DEBROX 6.5 % OT SOLN: 5.0000 [drp] | Freq: Every day | OTIC | 0 refills | Status: DC

## 2018-09-06 MED ORDER — ALPRAZOLAM 0.25 MG PO TABS: 0.2500 mg | ORAL_TABLET | Freq: Every day | ORAL | 1 refills | Status: DC | PRN

## 2018-09-06 NOTE — Progress Notes (Signed)
Patient ID: Shannon [name], female   DOB: [birth year], 53 y.o.   MRN: [id number]   Subjective:    Patient ID: Shannon [name], female    DOB: 02-12-1965, 53 y.o.   MRN: [id number]  HPI  Patient here for a scheduled follow up.  She reports she is doing relatively well.  Is followed at pain clinic for her back.  Has had some right low back pain recently.  Has seen Dr Olen Cordial.  Back is some better.  Desires no further intervention.  Staying active.  No chest pain.  No sob.  No acid reflux.  No abdominal pain.  Bowels moving.  Does report left ear fullness.   Handling stress relatively well.  Does not feel needs any further intervention.  Does take xanax.      Past Medical History:  Diagnosis Date  . Anemia   . Anxiety   . Breast pain, right 11/07/2012  . Breathing difficulty 06/20/2014  . Environmental allergies   . GERD (gastroesophageal reflux disease)   . Hiatal hernia    small  . History of migraine headaches   . Hyperthyroidism    s/p ablation  . Vaginitis 01/16/2013   Past Surgical History:  Procedure Laterality Date  . BREAST BIOPSY Right 12/05/2015   neg  . CESAREAN SECTION  11/98   Dr Roena Malady  . CHOLECYSTECTOMY  11/05   Dr Pat Patrick  . JOINT REPLACEMENT Left 2017   hip  . NASAL SINUS SURGERY  11/93  . SEPTOPLASTY  5/90   Dr Rossie Muskrat  . TUBAL LIGATION  02/1998  . UMBILICAL HERNIA REPAIR  02/1998   Family History  Problem Relation Age of Onset  . Heart disease Father   . Hypertension Father   . Thyroid disease Father   . Hypercholesterolemia Father   . Kidney cancer Father   . Leukemia Father        hairy cell  . Hypercholesterolemia Mother   . Thyroid disease Mother   . Breast cancer Mother 18  . Breast cancer Other        maternal great grandmother  . Heart disease Maternal Grandfather        MI - age 63  . Prostate cancer Paternal Grandfather   . Pancreatic cancer Paternal Grandfather   . Lung cancer Paternal Grandfather   . Bladder Cancer Neg Hx    Social  History   Socioeconomic History  . Marital status: Married    Spouse name: Not on file  . Number of children: 1  . Years of education: Not on file  . Highest education level: Not on file  Occupational History  . Not on file  Social Needs  . Financial resource strain: Not on file  . Food insecurity    Worry: Not on file    Inability: Not on file  . Transportation needs    Medical: Not on file    Non-medical: Not on file  Tobacco Use  . Smoking status: Former Smoker    Quit date: 01/28/2000    Years since quitting: 18.6  . Smokeless tobacco: Never Used  Substance and Sexual Activity  . Alcohol use: No    Alcohol/week: 0.0 standard drinks  . Drug use: No  . Sexual activity: Not on file  Lifestyle  . Physical activity    Days per week: Not on file    Minutes per session: Not on file  . Stress: Not on file  Relationships  . Social connections  Talks on phone: Not on file    Gets together: Not on file    Attends religious service: Not on file    Active member of club or organization: Not on file    Attends meetings of clubs or organizations: Not on file    Relationship status: Not on file  Other Topics Concern  . Not on file  Social History Narrative  . Not on file    Outpatient Encounter Medications as of 09/06/2018  Medication Sig  . acetaminophen (TYLENOL) 500 MG tablet Take 1,000 mg by mouth every 6 (six) hours as needed for moderate pain.  Marland Kitchen EPINEPHrine 0.3 mg/0.3 mL IJ SOAJ injection   . famotidine (PEPCID) 40 MG tablet Take by mouth.  Marland Kitchen omeprazole (PRILOSEC) 40 MG capsule Take 40 mg by mouth daily.  . SUMAtriptan (IMITREX) 100 MG tablet   . [DISCONTINUED] ALPRAZolam (XANAX) 0.25 MG tablet TAKE ONE TABLET AT BEDTIME AS NEEDED FORANXIETY  . ALPRAZolam (XANAX) 0.25 MG tablet Take 1 tablet (0.25 mg total) by mouth daily as needed for anxiety.  . carbamide peroxide (DEBROX) 6.5 % OTIC solution Place 5 drops into the left ear daily. Massage for approximately 5  minutes   No facility-administered encounter medications on file as of 09/06/2018.     Review of Systems  Constitutional: Negative for appetite change and unexpected weight change.  HENT: Negative for congestion and sinus pressure.   Respiratory: Negative for cough, chest tightness and shortness of breath.   Cardiovascular: Negative for chest pain, palpitations and leg swelling.  Gastrointestinal: Negative for diarrhea, nausea and vomiting.  Genitourinary: Negative for difficulty urinating and dysuria.  Musculoskeletal: Positive for back pain. Negative for joint swelling and myalgias.  Skin: Negative for color change and rash.  Neurological: Negative for dizziness, light-headedness and headaches.  Psychiatric/Behavioral: Negative for agitation and dysphoric mood.       Objective:    Physical Exam Constitutional:      General: She is not in acute distress.    Appearance: Normal appearance.  HENT:     Right Ear: External ear normal. There is no impacted cerumen.     Left Ear: External ear normal. There is impacted cerumen.  Eyes:     General:        Right eye: No discharge.        Left eye: No discharge.     Conjunctiva/sclera: Conjunctivae normal.  Neck:     Musculoskeletal: Neck supple. No muscular tenderness.     Thyroid: No thyromegaly.  Cardiovascular:     Rate and Rhythm: Normal rate and regular rhythm.  Pulmonary:     Effort: No respiratory distress.     Breath sounds: Normal breath sounds. No wheezing.  Abdominal:     General: Bowel sounds are normal.     Palpations: Abdomen is soft.     Tenderness: There is no abdominal tenderness.  Musculoskeletal:        General: No swelling or tenderness.  Lymphadenopathy:     Cervical: No cervical adenopathy.  Skin:    Findings: No erythema or rash.  Neurological:     Mental Status: She is alert.  Psychiatric:        Mood and Affect: Mood normal.        Behavior: Behavior normal.     BP 104/64 (BP Location: Left  Arm, Patient Position: Sitting, Cuff Size: Normal)   Pulse 77   Temp 98.1 F (36.7 C) (Oral)   Resp 14  Ht 5\' 2"  (1.575 m)   Wt 127 lb 9.6 oz (57.9 kg)   LMP 02/12/2013   SpO2 98%   BMI 23.34 kg/m  Wt Readings from Last 3 Encounters:  09/06/18 127 lb 9.6 oz (57.9 kg)  07/15/18 127 lb (57.6 kg)  04/13/18 127 lb (57.6 kg)     Lab Results  Component Value Date   WBC 6.5 03/08/2018   HGB 13.4 03/08/2018   HCT 39.4 03/08/2018   PLT 253.0 03/08/2018   GLUCOSE 73 03/08/2018   CHOL 187 02/26/2017   TRIG 177.0 (H) 02/26/2017   HDL 64.00 02/26/2017   LDLCALC 88 02/26/2017   ALT 26 03/08/2018   AST 25 03/08/2018   NA 138 03/08/2018   K 4.1 03/08/2018   CL 101 03/08/2018   CREATININE 0.73 03/08/2018   BUN 12 03/08/2018   CO2 28 03/08/2018   TSH 1.36 03/08/2018    Dg Pain Clinic C-arm 1-60 Min No Report  Result Date: 07/26/2018 Fluoro was used, but no Radiologist interpretation will be provided. Please refer to "NOTES" tab for provider progress note.      Assessment & Plan:   Problem List Items Addressed This Visit    Anemia    Follow cbc.       Anxiety    Discussed with her today.  Stable.  Takes xanax.  Follow.        Relevant Medications   ALPRAZolam (XANAX) 0.25 MG tablet   Cerumen impaction    Left ear - cerumen impaction.  Use debrox as directed.  Return for ear irrigation.        Chronic hip pain (resolved after hip replacement) (Left) (Chronic)    S/p total left hip arthroplasty.  Doing well.  Follow.       Chronic sacroiliac joint pain (intermittent) (Left) (Chronic)    Followed by pain clinic.  Stable.  Has seen chiropractor.        Gastroesophageal reflux disease without esophagitis    Controlled on current regimen.       Hyperthyroidism    S/p ablation.  Follow tsh.           [name], MD

## 2018-09-07 DIAGNOSIS — J301 Allergic rhinitis due to pollen: Secondary | ICD-10-CM | POA: Diagnosis not present

## 2018-09-07 DIAGNOSIS — J3089 Other allergic rhinitis: Secondary | ICD-10-CM | POA: Diagnosis not present

## 2018-09-11 ENCOUNTER — Encounter: Payer: Self-pay | Admitting: Internal Medicine

## 2018-09-11 DIAGNOSIS — H6120 Impacted cerumen, unspecified ear: Secondary | ICD-10-CM | POA: Insufficient documentation

## 2018-09-11 NOTE — Assessment & Plan Note (Signed)
S/p total left hip arthroplasty.  Doing well.  Follow.

## 2018-09-11 NOTE — Assessment & Plan Note (Signed)
Follow cbc.  

## 2018-09-11 NOTE — Assessment & Plan Note (Signed)
S/p ablation.  Follow tsh.

## 2018-09-11 NOTE — Assessment & Plan Note (Signed)
Followed by pain clinic.  Stable.  Has seen chiropractor.

## 2018-09-11 NOTE — Assessment & Plan Note (Signed)
Discussed with her today.  Stable.  Takes xanax.  Follow.

## 2018-09-11 NOTE — Assessment & Plan Note (Signed)
Controlled on current regimen.   

## 2018-09-11 NOTE — Assessment & Plan Note (Signed)
Left ear - cerumen impaction.  Use debrox as directed.  Return for ear irrigation.

## 2018-09-13 ENCOUNTER — Other Ambulatory Visit: Payer: Self-pay

## 2018-09-13 ENCOUNTER — Ambulatory Visit (INDEPENDENT_AMBULATORY_CARE_PROVIDER_SITE_OTHER): Payer: BC Managed Care – PPO | Admitting: *Deleted

## 2018-09-13 DIAGNOSIS — H6122 Impacted cerumen, left ear: Secondary | ICD-10-CM | POA: Diagnosis not present

## 2018-09-13 NOTE — Progress Notes (Signed)
Patient came in for cerumen impaction to left ear, irrigated with warm water using proper technique for 5 minutes with extraction of large amount of cerumen. Patient voiced no concerns or complaints during procedure.  Reviewed.  Dr Nicki Reaper

## 2018-09-14 ENCOUNTER — Encounter: Payer: Self-pay | Admitting: *Deleted

## 2018-09-14 DIAGNOSIS — J301 Allergic rhinitis due to pollen: Secondary | ICD-10-CM | POA: Diagnosis not present

## 2018-09-14 DIAGNOSIS — J3089 Other allergic rhinitis: Secondary | ICD-10-CM | POA: Diagnosis not present

## 2018-09-15 ENCOUNTER — Encounter: Payer: Self-pay | Admitting: Internal Medicine

## 2018-09-16 NOTE — Telephone Encounter (Signed)
Please schedule her for a nurse visit for ear irrigation.  She came in for left ear irrigation.  She request to have right ear irrigated.  Please notify her that it is ok to use the debrox and return for right ear irrigation and notify her of appt date and time.  Also, make note on nurse schedule - right ear irrigation per pt request

## 2018-09-17 ENCOUNTER — Encounter: Payer: Self-pay | Admitting: Internal Medicine

## 2018-09-17 NOTE — Telephone Encounter (Signed)
Pt scheduled. Noted on schedule. Pt is going to use debrox for a few days and come in on Thursday

## 2018-09-21 DIAGNOSIS — J3089 Other allergic rhinitis: Secondary | ICD-10-CM | POA: Diagnosis not present

## 2018-09-21 DIAGNOSIS — J301 Allergic rhinitis due to pollen: Secondary | ICD-10-CM | POA: Diagnosis not present

## 2018-09-23 ENCOUNTER — Telehealth: Payer: Self-pay | Admitting: Pain Medicine

## 2018-09-23 ENCOUNTER — Ambulatory Visit (INDEPENDENT_AMBULATORY_CARE_PROVIDER_SITE_OTHER): Payer: BC Managed Care – PPO

## 2018-09-23 ENCOUNTER — Other Ambulatory Visit: Payer: Self-pay

## 2018-09-23 DIAGNOSIS — H6121 Impacted cerumen, right ear: Secondary | ICD-10-CM | POA: Diagnosis not present

## 2018-09-23 NOTE — Telephone Encounter (Signed)
Patient has some questions she wants to ask [facility] before scheduling an appt for a procedure

## 2018-09-23 NOTE — Telephone Encounter (Signed)
No prior auth reqd, I called patient and left a vm telling her I put her on for Tuesday at 1015 and if that didn't work for her just call us back to r/s.

## 2018-09-23 NOTE — Telephone Encounter (Signed)
Spoke with patient and she is requesting a Right SIJI injection without sedation. There is a prn order in for this.  Please schedule as soon as available.

## 2018-09-28 ENCOUNTER — Ambulatory Visit (HOSPITAL_BASED_OUTPATIENT_CLINIC_OR_DEPARTMENT_OTHER): Payer: BC Managed Care – PPO | Admitting: Pain Medicine

## 2018-09-28 ENCOUNTER — Encounter: Payer: Self-pay | Admitting: Pain Medicine

## 2018-09-28 ENCOUNTER — Other Ambulatory Visit: Payer: Self-pay

## 2018-09-28 ENCOUNTER — Ambulatory Visit
Admission: RE | Admit: 2018-09-28 | Discharge: 2018-09-28 | Disposition: A | Discharge status: Home / Self Care | Payer: BC Managed Care – PPO | Source: Ambulatory Visit | Attending: Pain Medicine | Admitting: Pain Medicine

## 2018-09-28 VITALS — BP 127/83 | HR 89 | Temp 98.1°F | Resp 14 | Ht 62.0 in | Wt 125.0 lb

## 2018-09-28 DIAGNOSIS — M5388 Other specified dorsopathies, sacral and sacrococcygeal region: Secondary | ICD-10-CM | POA: Diagnosis not present

## 2018-09-28 DIAGNOSIS — G8929 Other chronic pain: Secondary | ICD-10-CM | POA: Diagnosis not present

## 2018-09-28 DIAGNOSIS — M545 Low back pain: Secondary | ICD-10-CM | POA: Insufficient documentation

## 2018-09-28 DIAGNOSIS — M47818 Spondylosis without myelopathy or radiculopathy, sacral and sacrococcygeal region: Secondary | ICD-10-CM | POA: Insufficient documentation

## 2018-09-28 DIAGNOSIS — M533 Sacrococcygeal disorders, not elsewhere classified: Secondary | ICD-10-CM

## 2018-09-28 DIAGNOSIS — M461 Sacroiliitis, not elsewhere classified: Secondary | ICD-10-CM

## 2018-09-28 MED ORDER — LIDOCAINE HCL 2 % IJ SOLN
20.0000 mL | Freq: Once | INTRAMUSCULAR | Status: AC
  Administered 2018-09-28: 400 mg
  Filled 2018-09-28: qty 40

## 2018-09-28 MED ORDER — ROPIVACAINE HCL 2 MG/ML IJ SOLN
9.0000 mL | Freq: Once | INTRAMUSCULAR | Status: AC
  Administered 2018-09-28: 9 mL via INTRA_ARTICULAR
  Filled 2018-09-28: qty 10

## 2018-09-28 MED ORDER — METHYLPREDNISOLONE ACETATE 80 MG/ML IJ SUSP
80.0000 mg | Freq: Once | INTRAMUSCULAR | Status: AC
  Administered 2018-09-28: 80 mg via INTRA_ARTICULAR
  Filled 2018-09-28: qty 1

## 2018-09-28 NOTE — Patient Instructions (Addendum)
____________________________________________________________________________________________  Post-Procedure Discharge Instructions  Instructions:  Apply ice:   Purpose: This will minimize any swelling and discomfort after procedure.   When: Day of procedure, as soon as you get home.  How: Fill a plastic sandwich bag with crushed ice. Cover it with a small towel and apply to injection site.  How long: (15 min on, 15 min off) Apply for 15 minutes then remove x 15 minutes.  Repeat sequence on day of procedure, until you go to bed.  Apply heat:   Purpose: To treat any soreness and discomfort from the procedure.  When: Starting the next day after the procedure.  How: Apply heat to procedure site starting the day following the procedure.  How long: May continue to repeat daily, until discomfort goes away.  Food intake: Start with clear liquids (like water) and advance to regular food, as tolerated.   Physical activities: Keep activities to a minimum for the first 8 hours after the procedure. After that, then as tolerated.  Driving: If you have received any sedation, be responsible and do not drive. You are not allowed to drive for 24 hours after having sedation.  Blood thinner: (Applies only to those taking blood thinners) You may restart your blood thinner 6 hours after your procedure.  Insulin: (Applies only to Diabetic patients taking insulin) As soon as you can eat, you may resume your normal dosing schedule.  Infection prevention: Keep procedure site clean and dry. Shower daily and clean area with soap and water.  Post-procedure Pain Diary: Extremely important that this be done correctly and accurately. Recorded information will be used to determine the next step in treatment. For the purpose of accuracy, follow these rules:  Evaluate only the area treated. Do not report or include pain from an untreated area. For the purpose of this evaluation, ignore all other areas of pain,  except for the treated area.  After your procedure, avoid taking a long nap and attempting to complete the pain diary after you wake up. Instead, set your alarm clock to go off every hour, on the hour, for the initial 8 hours after the procedure. Document the duration of the numbing medicine, and the relief you are getting from it.  Do not go to sleep and attempt to complete it later. It will not be accurate. If you received sedation, it is likely that you were given a medication that may cause amnesia. Because of this, completing the diary at a later time may cause the information to be inaccurate. This information is needed to plan your care.  Follow-up appointment: Keep your post-procedure follow-up evaluation appointment after the procedure (usually 2 weeks for most procedures, 6 weeks for radiofrequencies). DO NOT FORGET to bring you pain diary with you.   Expect: (What should I expect to see with my procedure?)  From numbing medicine (AKA: Local Anesthetics): Numbness or decrease in pain. You may also experience some weakness, which if present, could last for the duration of the local anesthetic.  Onset: Full effect within 15 minutes of injected.  Duration: It will depend on the type of local anesthetic used. On the average, 1 to 8 hours.   From steroids (Applies only if steroids were used): Decrease in swelling or inflammation. Once inflammation is improved, relief of the pain will follow.  Onset of benefits: Depends on the amount of swelling present. The more swelling, the longer it will take for the benefits to be seen. In some cases, up to 10 days.    Duration: Steroids will stay in the system x 2 weeks. Duration of benefits will depend on multiple posibilities including persistent irritating factors.  Side-effects: If present, they may typically last 2 weeks (the duration of the steroids).  Frequent: Cramps (if they occur, drink Gatorade and take over-the-counter Magnesium 450-500 mg  once to twice a day); water retention with temporary weight gain; increases in blood sugar; decreased immune system response; increased appetite.  Occasional: Facial flushing (red, warm cheeks); mood swings; menstrual changes.  Uncommon: Long-term decrease or suppression of natural hormones; bone thinning. (These are more common with higher doses or more frequent use. This is why we prefer that our patients avoid having any injection therapies in other practices.)   Very Rare: Severe mood changes; psychosis; aseptic necrosis.  From procedure: Some discomfort is to be expected once the numbing medicine wears off. This should be minimal if ice and heat are applied as instructed.  Call if: (When should I call?)  You experience numbness and weakness that gets worse with time, as opposed to wearing off.  New onset bowel or bladder incontinence. (Applies only to procedures done in the spine)  Emergency Numbers:  Durning business hours (Monday - Thursday, 8:00 AM - 4:00 PM) (Friday, 9:00 AM - 12:00 Noon): [phone]  After hours: [phone]  NOTE: If you are having a problem and are unable connect with, or to talk to a provider, then go to your nearest urgent care or emergency department. If the problem is serious and urgent, please call 911. ____________________________________________________________________________________________   Pain Management Discharge Instructions  General Discharge Instructions :  If you need to reach your doctor call: Monday-Friday 8:00 am - 4:00 pm at [phone] or toll free [phone].  After clinic hours [phone] to have operator reach doctor.  Bring all of your medication bottles to all your appointments in the pain clinic.  To cancel or reschedule your appointment with Pain Management please remember to call 24 hours in advance to avoid a fee.  Refer to the educational materials which you have been given on: General Risks, I had my  Procedure. Discharge Instructions, Post Sedation.  Post Procedure Instructions:  The drugs you were given will stay in your system until tomorrow, so for the next 24 hours you should not drive, make any legal decisions or drink any alcoholic beverages.  You may eat anything you prefer, but it is better to start with liquids then soups and crackers, and gradually work up to solid foods.  Please notify your doctor immediately if you have any unusual bleeding, trouble breathing or pain that is not related to your normal pain.  Depending on the type of procedure that was done, some parts of your body may feel week and/or numb.  This usually clears up by tonight or the next day.  Walk with the use of an assistive device or accompanied by an adult for the 24 hours.  You may use ice on the affected area for the first 24 hours.  Put ice in a Ziploc bag and cover with a towel and place against area 15 minutes on 15 minutes off.  You may switch to heat after 24 hours.Sacroiliac (SI) Joint Injection Patient Information  Description: The sacroiliac joint connects the scrum (very low back and tailbone) to the ilium (a pelvic bone which also forms half of the hip joint).  Normally this joint experiences very little motion.  When this joint becomes inflamed or unstable low back and or hip   and pelvis pain may result.  Injection of this joint with local anesthetics (numbing medicines) and steroids can provide diagnostic information and reduce pain.  This injection is performed with the aid of x-ray guidance into the tailbone area while you are lying on your stomach.   You may experience an electrical sensation down the leg while this is being done.  You may also experience numbness.  We also may ask if we are reproducing your normal pain during the injection.  Conditions which may be treated SI injection:   Low back, buttock, hip or leg pain  Preparation for the Injection:  1. Do not eat any solid food or  dairy products within 8 hours of your appointment.  2. You may drink clear liquids up to 3 hours before appointment.  Clear liquids include water, black coffee, juice or soda.  No milk or cream please. 3. You may take your regular medications, including pain medications with a sip of water before your appointment.  Diabetics should hold regular insulin (if take separately) and take 1/2 normal NPH dose the morning of the procedure.  Carry some sugar containing items with you to your appointment. 4. A driver must accompany you and be prepared to drive you home after your procedure. 5. Bring all of your current medications with you. 6. An IV may be inserted and sedation may be given at the discretion of the physician. 7. A blood pressure cuff, EKG and other monitors will often be applied during the procedure.  Some patients may need to have extra oxygen administered for a short period.  8. You will be asked to provide medical information, including your allergies, prior to the procedure.  We must know immediately if you are taking blood thinners (like Coumadin/Warfarin) or if you are allergic to IV iodine contrast (dye).  We must know if you could possible be pregnant.  Possible side effects:   Bleeding from needle site  Infection (rare, may require surgery)  Nerve injury (rare)  Numbness & tingling (temporary)  A brief convulsion or seizure  Light-headedness (temporary)  Pain at injection site (several days)  Decreased blood pressure (temporary)  Weakness in the leg (temporary)   Call if you experience:   New onset weakness or numbness of an extremity below the injection site that last more than 8 hours.  Hives or difficulty breathing ( go to the emergency room)  Inflammation or drainage at the injection site  Any new symptoms which are concerning to you  Please note:  Although the local anesthetic injected can often make your back/ hip/ buttock/ leg feel good for several  hours after the injections, the pain will likely return.  It takes 3-7 days for steroids to work in the sacroiliac area.  You may not notice any pain relief for at least that one week.  If effective, we will often do a series of three injections spaced 3-6 weeks apart to maximally decrease your pain.  After the initial series, we generally will wait some months before a repeat injection of the same type.  If you have any questions, please call [phone] [facility]   

## 2018-09-28 NOTE — Progress Notes (Addendum)
Patient's Name: Shannon [name]  MRN: [id number]  Referring Provider: Milinda Pointer, MD  DOB: 11/11/1965  PCP: [name], MD  DOS: 09/28/2018  Note by: Gaspar Cola, MD  Service setting: Ambulatory outpatient  Specialty: Interventional Pain Management  Patient type: Established  Location: [facility] (AMB) Pain Management Facility  Visit type: Interventional Procedure   Primary Reason for Visit: Interventional Pain Management Treatment. CC: Hip Pain (bilateral)  Procedure:          Anesthesia, Analgesia, Anxiolysis:  Type: Palliative Sacroiliac Joint Steroid Injection #R4/L2  Region: Superior Lumbosacral Region Level: PSIS (Posterior Superior Iliac Spine) Laterality: Bilateral  Type: Local Anesthesia Indication(s): Analgesia         Route: Infiltration ([place]/IM) IV Access: Declined Sedation: Declined  Local Anesthetic: Lidocaine 1-2%  Position: Prone           Indications: 1. Chronic sacroiliac joint pain (intermittent) (Left)   2. Chronic sacroiliac joint pain (Right)   3. Chronic low back pain (Primary Area of Pain) (Bilateral) (R>L)   4. Osteoarthritis of sacroiliac joint (Bilateral) (R>L)   5. Other specified dorsopathies, sacral and sacrococcygeal region    Pain Score: Pre-procedure: 3 /10 Post-procedure: 3 /10   Pre-op Assessment:  Shannon [name] is a 53 y.o. (year old), female patient, seen today for interventional treatment. She  has a past surgical history that includes Septoplasty (5/90); Nasal sinus surgery (11/93); Cesarean section (11/98); Tubal ligation (02/1998); Umbilical hernia repair (02/1998); Cholecystectomy (11/05); Joint replacement (Left, 2017); and Breast biopsy (Right, 12/05/2015). Shannon [name] has a current medication list which includes the following prescription(s): acetaminophen, alprazolam, debrox, epinephrine, famotidine, omeprazole, and sumatriptan. Her primarily concern today is the Hip Pain (bilateral)  Initial Vital Signs:  Pulse/HCG Rate: 89ECG  Heart Rate: 72 Temp: 98.1 F (36.7 C) Resp: 16 BP: 115/80 SpO2: 98 %  BMI: Estimated body mass index is 22.86 kg/m as calculated from the following:   Height as of this encounter: 5\' 2"  (1.575 m).   Weight as of this encounter: 125 lb (56.7 kg).  Risk Assessment: Allergies: Reviewed. She is allergic to codeine; decongest-aid [pseudoephedrine]; flagyl [metronidazole]; and sulfa antibiotics.  Allergy Precautions: None required Coagulopathies: Reviewed. None identified.  Blood-thinner therapy: None at this time Active Infection(s): Reviewed. None identified. Shannon [name] is afebrile  Site Confirmation: Shannon [name] was asked to confirm the procedure and laterality before marking the site Procedure checklist: Completed Consent: Before the procedure and under the influence of no sedative(s), amnesic(s), or anxiolytics, the patient was informed of the treatment options, risks and possible complications. To fulfill our ethical and legal obligations, as recommended by the American Medical Association's Code of Ethics, I have informed the patient of my clinical impression; the nature and purpose of the treatment or procedure; the risks, benefits, and possible complications of the intervention; the alternatives, including doing nothing; the risk(s) and benefit(s) of the alternative treatment(s) or procedure(s); and the risk(s) and benefit(s) of doing nothing. The patient was provided information about the general risks and possible complications associated with the procedure. These may include, but are not limited to: failure to achieve desired goals, infection, bleeding, organ or nerve damage, allergic reactions, paralysis, and death. In addition, the patient was informed of those risks and complications associated to the procedure, such as failure to decrease pain; infection; bleeding; organ or nerve damage with subsequent damage to sensory, motor, and/or autonomic systems, resulting in permanent pain,  numbness, and/or weakness of one or several areas of the body; allergic reactions; (  i.e.: anaphylactic reaction); and/or death. Furthermore, the patient was informed of those risks and complications associated with the medications. These include, but are not limited to: allergic reactions (i.e.: anaphylactic or anaphylactoid reaction(s)); adrenal axis suppression; blood sugar elevation that in diabetics may result in ketoacidosis or comma; water retention that in patients with history of congestive heart failure may result in shortness of breath, pulmonary edema, and decompensation with resultant heart failure; weight gain; swelling or edema; medication-induced neural toxicity; particulate matter embolism and blood vessel occlusion with resultant organ, and/or nervous system infarction; and/or aseptic necrosis of one or more joints. Finally, the patient was informed that Medicine is not an exact science; therefore, there is also the possibility of unforeseen or unpredictable risks and/or possible complications that may result in a catastrophic outcome. The patient indicated having understood very clearly. We have given the patient no guarantees and we have made no promises. Enough time was given to the patient to ask questions, all of which were answered to the patient's satisfaction. Shannon [name] has indicated that she wanted to continue with the procedure. Attestation: I, the ordering provider, attest that I have discussed with the patient the benefits, risks, side-effects, alternatives, likelihood of achieving goals, and potential problems during recovery for the procedure that I have provided informed consent. Date  Time: 09/28/2018  9:58 AM  Pre-Procedure Preparation:  Monitoring: As per clinic protocol. Respiration, ETCO2, SpO2, BP, heart rate and rhythm monitor placed and checked for adequate function Safety Precautions: Patient was assessed for positional comfort and pressure points before starting the  procedure. Time-out: I initiated and conducted the "Time-out" before starting the procedure, as per protocol. The patient was asked to participate by confirming the accuracy of the "Time Out" information. Verification of the correct person, site, and procedure were performed and confirmed by me, the nursing staff, and the patient. "Time-out" conducted as per Joint Commission's Universal Protocol (UP.01.01.01). Time: 1026  Description of Procedure:          Target Area: Superior, posterior, aspect of the sacroiliac fissure Approach: Posterior, paraspinal, ipsilateral approach. Area Prepped: Entire Lower Lumbosacral Region Prepping solution: DuraPrep (Iodine Povacrylex [0.7% available iodine] and Isopropyl Alcohol, 74% w/w) Safety Precautions: Aspiration looking for blood return was conducted prior to all injections. At no point did we inject any substances, as a needle was being advanced. No attempts were made at seeking any paresthesias. Safe injection practices and needle disposal techniques used. Medications properly checked for expiration dates. SDV (single dose vial) medications used. Description of the Procedure: Protocol guidelines were followed. The patient was placed in position over the procedure table. The target area was identified and the area prepped in the usual manner. Skin & deeper tissues infiltrated with local anesthetic. Appropriate amount of time allowed to pass for local anesthetics to take effect. The procedure needle was advanced under fluoroscopic guidance into the sacroiliac joint until a firm endpoint was obtained. Proper needle placement secured. Negative aspiration confirmed. Solution injected in intermittent fashion, asking for systemic symptoms every 0.5cc of injectate. The needles were then removed and the area cleansed, making sure to leave some of the prepping solution back to take advantage of its long term bactericidal properties. Vitals:   09/28/18 0957 09/28/18 1022  09/28/18 1027 09/28/18 1031  BP: 115/80 125/86 130/86 127/83  Pulse: 89     Resp: 16 15 12 14   Temp: 98.1 F (36.7 C)     SpO2: 98% 100% 99% 99%  Weight: 125 lb (56.7 kg)  Height: 5\' 2"  (1.575 m)       Start Time: 1026 hrs. End Time: 1031 hrs. Materials:  Needle(s) Type: Spinal Needle Gauge: 22G Length: 3.5-in Medication(s): Please see orders for medications and dosing details.  Imaging Guidance (Non-Spinal):          Type of Imaging Technique: Fluoroscopy Guidance (Non-Spinal) Indication(s): Assistance in needle guidance and placement for procedures requiring needle placement in or near specific anatomical locations not easily accessible without such assistance. Exposure Time: Please see nurses notes. Contrast: Before injecting any contrast, we confirmed that the patient did not have an allergy to iodine, shellfish, or radiological contrast. Once satisfactory needle placement was completed at the desired level, radiological contrast was injected. Contrast injected under live fluoroscopy. No contrast complications. See chart for type and volume of contrast used. Fluoroscopic Guidance: I was personally present during the use of fluoroscopy. "Tunnel Vision Technique" used to obtain the best possible view of the target area. Parallax error corrected before commencing the procedure. "Direction-depth-direction" technique used to introduce the needle under continuous pulsed fluoroscopy. Once target was reached, antero-posterior, oblique, and lateral fluoroscopic projection used confirm needle placement in all planes. Images permanently stored in EMR. Interpretation: I personally interpreted the imaging intraoperatively. Adequate needle placement confirmed in multiple planes. Appropriate spread of contrast into desired area was observed. No evidence of afferent or efferent intravascular uptake. Permanent images saved into the patient's record.  Antibiotic Prophylaxis:   Anti-infectives (From  admission, onward)   None     Indication(s): None identified  Post-operative Assessment:  Post-procedure Vital Signs:  Pulse/HCG Rate: 8980 Temp: 98.1 F (36.7 C) Resp: 14 BP: 127/83 SpO2: 99 %  EBL: None  Complications: No immediate post-treatment complications observed by team, or reported by patient.  Note: The patient tolerated the entire procedure well. A repeat set of vitals were taken after the procedure and the patient was kept under observation following institutional policy, for this type of procedure. Post-procedural neurological assessment was performed, showing return to baseline, prior to discharge. The patient was provided with post-procedure discharge instructions, including a section on how to identify potential problems. Should any problems arise concerning this procedure, the patient was given instructions to immediately contact us, at any time, without hesitation. In any case, we plan to contact the patient by telephone for a follow-up status report regarding this interventional procedure.  Comments:  No additional relevant information.  Plan of Care  Orders:  Orders Placed This Encounter  Procedures  . SACROILIAC JOINT INJECTION    Scheduling Instructions:     Side: Bilateral     Sedation: No Sedation.     Timeframe: Today    Order Specific Question:   Where will this procedure be performed?    Answer:   [facility]  . [facility] C-ARM 1-60 MIN NO REPORT    Intraoperative interpretation by procedural physician at [facility].    Standing Status:   Standing    Number of Occurrences:   1    Order Specific Question:   Reason for exam:    Answer:   Assistance in needle guidance and placement for procedures requiring needle placement in or near specific anatomical locations not easily accessible without such assistance.  . Provider attestation of informed consent for procedure/surgical case    I, the ordering provider, attest that I  have discussed with the patient the benefits, risks, side effects, alternatives, likelihood of achieving goals and potential problems during recovery for the  procedure that I have provided informed consent.    Standing Status:   Standing    Number of Occurrences:   1  . Informed Consent Details: Transcribe to consent form and obtain patient signature    Consent Attestation: I, the ordering provider, attest that I have discussed with the patient the benefits, risks, side-effects, alternatives, likelihood of achieving goals, and potential problems during recovery for the procedure that I have provided informed consent.    Standing Status:   Standing    Number of Occurrences:   1    Order Specific Question:   Procedure    Answer:   Diagnostic, bilateral, sacroiliac joint block under fluoroscopic guidance    Order Specific Question:   Surgeon    Answer:   Kenasia Scheller A. Dossie Arbour, MD    Order Specific Question:   Indication/Reason    Answer:   Low back pain secondary sleep to sacroiliac joint dysfunction/arthralgia   Chronic Opioid Analgesic:  None from our practice.   Medications ordered for procedure: Meds ordered this encounter  Medications  . lidocaine (XYLOCAINE) 2 % (with pres) injection 400 mg  . ropivacaine (PF) 2 mg/mL (0.2%) (NAROPIN) injection 9 mL  . methylPREDNISolone acetate (DEPO-MEDROL) injection 80 mg   Medications administered: We administered lidocaine, ropivacaine (PF) 2 mg/mL (0.2%), and methylPREDNISolone acetate.  See the medical record for exact dosing, route, and time of administration.  Follow-up plan:   Return in about 2 weeks (around 10/12/2018) for (VV), (PP).       Considering:   Possible right-sided lumbar facet RFA#1 Possible right-sided sacroiliac joint RFA#1 Possibleleft-sided sacroiliac joint RFA#1   Palliative PRN treatment(s):   Palliativeright gluteal MNB Palliative right SI joint block#5 Palliative left SI joint block#3 Palliative  right L4-5LESI#2 Palliative left-sided L4-5 LESI #2 Palliativeright lumbar facet block #4 Diagnosticleft lumbar facet block #2 Diagnostic right great toe intra-articular injection #2 Palliative rightIAsmall joint injection1stMTP(DorsalMetatarsophalangeal)#2    Recent Visits Date Type Provider Dept  08/02/18 Office Visit Milinda Pointer, MD [facility]  07/15/18 Procedure visit Milinda Pointer, MD [facility]  Showing recent visits within past 90 days and meeting all other requirements   Today's Visits Date Type Provider Dept  09/28/18 Procedure visit Milinda Pointer, MD [facility]  Showing today's visits and meeting all other requirements   Future Appointments Date Type Provider Dept  10/20/18 Appointment Milinda Pointer, MD [facility]  Showing future appointments within next 90 days and meeting all other requirements   Disposition: Discharge home  Discharge Date & Time: 09/28/2018; 1045 hrs.   Primary Care Physician: [name], MD Location: [facility] Note by: Gaspar Cola, MD Date: 09/28/2018; Time: 11:38 AM  Disclaimer:  Medicine is not an exact science. The only guarantee in medicine is that nothing is guaranteed. It is important to note that the decision to proceed with this intervention was based on the information collected from the patient. The Data and conclusions were drawn from the patient's questionnaire, the interview, and the physical examination. Because the information was provided in large part by the patient, it cannot be guaranteed that it has not been purposely or unconsciously manipulated. Every effort has been made to obtain as much relevant data as possible for this evaluation. It is important to note that the conclusions that lead to this procedure are derived in large part from the available data. Always take into account that the treatment will also  be dependent on availability of resources  and existing treatment guidelines, considered by other Pain Management Practitioners as being common knowledge and practice, at the time of the intervention. For Medico-Legal purposes, it is also important to point out that variation in procedural techniques and pharmacological choices are the acceptable norm. The indications, contraindications, technique, and results of the above procedure should only be interpreted and judged by a Board-Certified Interventional Pain Specialist with extensive familiarity and expertise in the same exact procedure and technique.

## 2018-09-29 ENCOUNTER — Telehealth: Payer: Self-pay

## 2018-09-29 NOTE — Telephone Encounter (Signed)
Post procedure phone call.  Patient states she is doing well.  

## 2018-09-30 DIAGNOSIS — J301 Allergic rhinitis due to pollen: Secondary | ICD-10-CM | POA: Diagnosis not present

## 2018-09-30 DIAGNOSIS — J3089 Other allergic rhinitis: Secondary | ICD-10-CM | POA: Diagnosis not present

## 2018-10-05 DIAGNOSIS — J301 Allergic rhinitis due to pollen: Secondary | ICD-10-CM | POA: Diagnosis not present

## 2018-10-05 DIAGNOSIS — J3089 Other allergic rhinitis: Secondary | ICD-10-CM | POA: Diagnosis not present

## 2018-10-07 NOTE — Progress Notes (Addendum)
Patient presented today for ear irrigation of the right ear per MD order.  Assisted clinic team lead Juliann Pulse, RN with irrigation of right ear.  Juliann Pulse, RN removed one small piece of cerumen.  Patient tolerated well with no complaints of discomfort.  Reviewed. Dr Nicki Reaper

## 2018-10-12 DIAGNOSIS — J301 Allergic rhinitis due to pollen: Secondary | ICD-10-CM | POA: Diagnosis not present

## 2018-10-12 DIAGNOSIS — J3089 Other allergic rhinitis: Secondary | ICD-10-CM | POA: Diagnosis not present

## 2018-10-13 ENCOUNTER — Other Ambulatory Visit: Payer: Self-pay | Admitting: Internal Medicine

## 2018-10-19 ENCOUNTER — Other Ambulatory Visit: Payer: Self-pay | Admitting: Internal Medicine

## 2018-10-19 DIAGNOSIS — J301 Allergic rhinitis due to pollen: Secondary | ICD-10-CM | POA: Diagnosis not present

## 2018-10-19 DIAGNOSIS — Z1231 Encounter for screening mammogram for malignant neoplasm of breast: Secondary | ICD-10-CM

## 2018-10-19 DIAGNOSIS — J3089 Other allergic rhinitis: Secondary | ICD-10-CM | POA: Diagnosis not present

## 2018-10-19 NOTE — Progress Notes (Signed)
Pain Management Virtual Encounter Note - Virtual Visit via Telephone Telehealth (real-time audio visits between healthcare provider and patient).   Patient's Phone No. & Preferred Pharmacy:  [phone] (home); [phone] (mobile); (Preferred) [phone] [email or web address]  [place], [place] - [address] [address] Phone: [phone] Fax: [phone]    Pre-screening note:  Our staff contacted Shannon [name] and offered her an "in person", "face-to-face" appointment versus a telephone encounter. She indicated preferring the telephone encounter, at this time.   Reason for Virtual Visit: COVID-19*  Social distancing based on CDC and AMA recommendations.   I contacted Shannon [name] on 10/20/2018 via telephone.      I clearly identified myself as Gaspar Cola, MD. I verified that I was speaking with the correct person using two identifiers (Name: Shannon [name], and date of birth: [birth year]).  Advanced Informed Consent I sought verbal advanced consent from Shannon [name] for virtual visit interactions. I informed Shannon [name] of possible security and privacy concerns, risks, and limitations associated with providing "not-in-person" medical evaluation and management services. I also informed Shannon [name] of the availability of "in-person" appointments. Finally, I informed her that there would be a charge for the virtual visit and that she could be  personally, fully or partially, financially responsible for it. Shannon [name] expressed understanding and agreed to proceed.   Historic Elements   Shannon [name] is a 53 y.o. year old, female patient evaluated today after her last encounter by our practice on 09/29/2018. Shannon [name]  has a past medical history of Anemia, Anxiety, Breast pain, right (11/07/2012), Breathing difficulty (06/20/2014), Environmental allergies, GERD (gastroesophageal reflux disease), Hiatal hernia, History of migraine  headaches, Hyperthyroidism, and Vaginitis (01/16/2013). She also  has a past surgical history that includes Septoplasty (5/90); Nasal sinus surgery (11/93); Cesarean section (11/98); Tubal ligation (02/1998); Umbilical hernia repair (02/1998); Cholecystectomy (11/05); Joint replacement (Left, 2017); and Breast biopsy (Right, 12/05/2015). Shannon [name] has a current medication list which includes the following prescription(s): acetaminophen, epinephrine, famotidine, omeprazole, sumatriptan, and debrox. She  reports that she quit smoking about 18 years ago. She has never used smokeless tobacco. She reports that she does not drink alcohol or use drugs. Shannon [name] is allergic to codeine; decongest-aid [pseudoephedrine]; flagyl [metronidazole]; and sulfa antibiotics.   HPI  Today, she is being contacted for a post-procedure assessment.  As always, she did extremely well.  This time she did have a little bit of pain about 3 days after the procedure, but it did go away and she is currently not having any pain.  Post-Procedure Evaluation  Procedure: Palliative bilateral sacroiliac joint block #R4/L2 under fluoroscopic guidance, no sedation Pre-procedure pain level:  3/10 Post-procedure: 3/10 No initial benefit, possibly due to rapid discharge after no sedation procedure, without enough time to allow full onset of block.  Sedation: None.  Effectiveness during initial hour after procedure(Ultra-Short Term Relief): 100 %   Local anesthetic used: Long-acting (4-6 hours) Effectiveness: Defined as any analgesic benefit obtained secondary to the administration of local anesthetics. This carries significant diagnostic value as to the etiological location, or anatomical origin, of the pain. Duration of benefit is expected to coincide with the duration of the local anesthetic used.  Effectiveness during initial 4-6 hours after procedure(Short-Term Relief): 100 %   Long-term benefit: Defined as any relief past the  pharmacologic duration of the local anesthetics.  Effectiveness past the initial 6 hours after procedure(Long-Term Relief): 100 %  Current benefits: Defined as benefit that persist at this time.   Analgesia:  90-100% better Function: Shannon [name] reports improvement in function ROM: Shannon [name] reports improvement in ROM  Pharmacotherapy Assessment  Analgesic: None from our practice.   Monitoring: Pharmacotherapy: No side-effects or adverse reactions reported. [place] PMP: PDMP reviewed during this encounter.       Compliance: No problems identified. Effectiveness: Clinically acceptable. Plan: Refer to "POC".  UDS: No results found for: SUMMARY Laboratory Chemistry Profile (12 mo)  Renal: 03/08/2018: BUN 12; Creatinine, Ser 0.73  Lab Results  Component Value Date   GFR 82.93 03/08/2018   GFRAA >60 05/02/2014   GFRNONAA >60 05/02/2014   Hepatic: 03/08/2018: Albumin 4.3 Lab Results  Component Value Date   AST 25 03/08/2018   ALT 26 03/08/2018   Other: No results found for requested labs within last 8760 hours. Note: Above Lab results reviewed.  Imaging  Last 90 days:  Dg Pain Clinic C-arm 1-60 Min No Report  Result Date: 09/28/2018 Fluoro was used, but no Radiologist interpretation will be provided. Please refer to "NOTES" tab for provider progress note.   Assessment  The primary encounter diagnosis was Chronic pain syndrome. Diagnoses of Chronic low back pain (Primary Area of Pain) (Bilateral) (R>L) and Chronic sacroiliac joint pain (Bilateral) (R>L) were also pertinent to this visit.  Plan of Care  I am having Shannon [name] maintain her omeprazole, acetaminophen, EPINEPHrine, famotidine, SUMAtriptan, and Debrox.  Pharmacotherapy (Medications Ordered): No orders of the defined types were placed in this encounter.  Orders:  No orders of the defined types were placed in this encounter.  Follow-up plan:   Return if symptoms worsen or fail to improve.      Considering:    Possible right-sided lumbar facet RFA#1 Possible right-sided sacroiliac joint RFA#1 Possibleleft-sided sacroiliac joint RFA#1   Palliative PRN treatment(s):   Palliativeright gluteal MNB Palliative right SI joint block#5 Palliative left SI joint block#3 Palliative right L4-5LESI#2 Palliative left-sided L4-5 LESI #2 Palliativeright lumbar facet block #4 Diagnosticleft lumbar facet block #2 Diagnostic right great toe intra-articular injection #2 Palliative rightIAsmall joint injection1stMTP(DorsalMetatarsophalangeal)#2     Recent Visits Date Type Provider Dept  09/28/18 Procedure visit Milinda Pointer, MD [facility]  08/02/18 Office Visit Milinda Pointer, MD [facility]  Showing recent visits within past 90 days and meeting all other requirements   Today's Visits Date Type Provider Dept  10/20/18 Office Visit Milinda Pointer, MD [facility]  Showing today's visits and meeting all other requirements   Future Appointments No visits were found meeting these conditions.  Showing future appointments within next 90 days and meeting all other requirements   I discussed the assessment and treatment plan with the patient. The patient was provided an opportunity to ask questions and all were answered. The patient agreed with the plan and demonstrated an understanding of the instructions.  Patient advised to call back or seek an in-person evaluation if the symptoms or condition worsens.  Total duration of non-face-to-face encounter: 10 minutes.  Note by: Gaspar Cola, MD Date: 10/20/2018; Time: 3:13 PM  Note: This dictation was prepared with Dragon dictation. Any transcriptional errors that may result from this process are unintentional.  Disclaimer:  * Given the special circumstances of the COVID-19 pandemic, the federal government has announced that the Office for Civil Rights (OCR) will exercise its  enforcement discretion and will not impose penalties on physicians using telehealth in the event of noncompliance with regulatory requirements  under the [employer] and Accountability Act (HIPAA) in connection with the good faith provision of telehealth during the PTWSF-68 national public health emergency. (AMA)

## 2018-10-20 ENCOUNTER — Other Ambulatory Visit: Payer: Self-pay

## 2018-10-20 ENCOUNTER — Encounter: Payer: Self-pay | Admitting: Pain Medicine

## 2018-10-20 ENCOUNTER — Ambulatory Visit: Payer: BC Managed Care – PPO | Attending: Pain Medicine | Admitting: Pain Medicine

## 2018-10-20 DIAGNOSIS — M545 Low back pain: Secondary | ICD-10-CM

## 2018-10-20 DIAGNOSIS — M533 Sacrococcygeal disorders, not elsewhere classified: Secondary | ICD-10-CM | POA: Diagnosis not present

## 2018-10-20 DIAGNOSIS — G8929 Other chronic pain: Secondary | ICD-10-CM

## 2018-10-20 DIAGNOSIS — G894 Chronic pain syndrome: Secondary | ICD-10-CM

## 2018-10-26 DIAGNOSIS — J3089 Other allergic rhinitis: Secondary | ICD-10-CM | POA: Diagnosis not present

## 2018-10-26 DIAGNOSIS — J301 Allergic rhinitis due to pollen: Secondary | ICD-10-CM | POA: Diagnosis not present

## 2018-11-02 DIAGNOSIS — J301 Allergic rhinitis due to pollen: Secondary | ICD-10-CM | POA: Diagnosis not present

## 2018-11-02 DIAGNOSIS — J3089 Other allergic rhinitis: Secondary | ICD-10-CM | POA: Diagnosis not present

## 2018-11-09 DIAGNOSIS — J301 Allergic rhinitis due to pollen: Secondary | ICD-10-CM | POA: Diagnosis not present

## 2018-11-09 DIAGNOSIS — J3089 Other allergic rhinitis: Secondary | ICD-10-CM | POA: Diagnosis not present

## 2018-11-11 ENCOUNTER — Other Ambulatory Visit: Payer: Self-pay | Admitting: Internal Medicine

## 2018-11-12 ENCOUNTER — Encounter: Payer: Self-pay | Admitting: Internal Medicine

## 2018-11-12 ENCOUNTER — Other Ambulatory Visit: Payer: Self-pay

## 2018-11-12 NOTE — Progress Notes (Unsigned)
Last OV 09/06/18 Next OV 03/10/19 Last refill 09/06/2018

## 2018-11-13 NOTE — Telephone Encounter (Signed)
rx sent in for xanax #30 with 2 refills.

## 2018-11-13 NOTE — Progress Notes (Signed)
rx sent in for xanax.

## 2018-11-16 DIAGNOSIS — J3089 Other allergic rhinitis: Secondary | ICD-10-CM | POA: Diagnosis not present

## 2018-11-16 DIAGNOSIS — J3081 Allergic rhinitis due to animal (cat) (dog) hair and dander: Secondary | ICD-10-CM | POA: Diagnosis not present

## 2018-11-16 DIAGNOSIS — J301 Allergic rhinitis due to pollen: Secondary | ICD-10-CM | POA: Diagnosis not present

## 2018-11-17 ENCOUNTER — Encounter: Payer: Self-pay | Admitting: Internal Medicine

## 2018-11-18 ENCOUNTER — Other Ambulatory Visit: Payer: Self-pay

## 2018-11-18 DIAGNOSIS — Z20822 Contact with and (suspected) exposure to covid-19: Secondary | ICD-10-CM

## 2018-11-18 NOTE — Telephone Encounter (Signed)
Please schedule a doxy appt with me tomorrow at 10:00.  Thanks

## 2018-11-18 NOTE — Telephone Encounter (Signed)
Pt scheduled. She was tested today

## 2018-11-19 ENCOUNTER — Other Ambulatory Visit: Payer: Self-pay

## 2018-11-19 ENCOUNTER — Ambulatory Visit (INDEPENDENT_AMBULATORY_CARE_PROVIDER_SITE_OTHER): Payer: BC Managed Care – PPO | Admitting: Internal Medicine

## 2018-11-19 ENCOUNTER — Encounter: Payer: Self-pay | Admitting: Internal Medicine

## 2018-11-19 DIAGNOSIS — Z20822 Contact with and (suspected) exposure to covid-19: Secondary | ICD-10-CM

## 2018-11-19 DIAGNOSIS — Z20828 Contact with and (suspected) exposure to other viral communicable diseases: Secondary | ICD-10-CM | POA: Diagnosis not present

## 2018-11-19 NOTE — Progress Notes (Signed)
Patient ID: Shannon [name], female   DOB: [birth year], 53 y.o.   MRN: [id number]   Virtual Visit via video Note  This visit type was conducted due to national recommendations for restrictions regarding the COVID-19 pandemic (e.g. social distancing).  This format is felt to be most appropriate for this patient at this time.  All issues noted in this document were discussed and addressed.  No physical exam was performed (except for noted visual exam findings with Video Visits).   I connected with Terisa Starr by a video enabled telemedicine application and verified that I am speaking with the correct person using two identifiers. Location patient: home Location provider: work  Persons participating in the virtual visit: patient, provider  I discussed the limitations, risks, security and privacy concerns of performing an evaluation and management service by video and the availability of in person appointments.  The patient expressed understanding and agreed to proceed.   Reason for visit: work in appt  HPI: Work in appt for possible covid exposure.  A coworker was diagnosed.  She works at the office one day per week.  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  States he tested positive three days ago.  She has no symptoms.  Desk and office cleaned before she sat in the chair.  No fever.  No cough, congestion or sob.  No chest pain or tightness.  No nausea or vomiting.  No abdominal pain.  No diarrhea.  Had questions about quarantine.  Just went to be tested.     ROS: See pertinent positives and negatives per HPI.  Past Medical History:  Diagnosis Date  . Anemia   . Anxiety   . Breast pain, right 11/07/2012  . Breathing difficulty 06/20/2014  . Environmental allergies   . GERD (gastroesophageal reflux disease)   . Hiatal hernia    small  . History of migraine headaches   . Hyperthyroidism    s/p ablation  . Vaginitis 01/16/2013    Past Surgical History:  Procedure Laterality  Date  . BREAST BIOPSY Right 12/05/2015   neg  . CESAREAN SECTION  11/98   Dr Roena Malady  . CHOLECYSTECTOMY  11/05   Dr Pat Patrick  . JOINT REPLACEMENT Left 2017   hip  . NASAL SINUS SURGERY  11/93  . SEPTOPLASTY  5/90   Dr Rossie Muskrat  . TUBAL LIGATION  02/1998  . UMBILICAL HERNIA REPAIR  02/1998    Family History  Problem Relation Age of Onset  . Heart disease Father   . Hypertension Father   . Thyroid disease Father   . Hypercholesterolemia Father   . Kidney cancer Father   . Leukemia Father        hairy cell  . Hypercholesterolemia Mother   . Thyroid disease Mother   . Breast cancer Mother 91  . Breast cancer Other        maternal great grandmother  . Heart disease Maternal Grandfather        MI - age 106  . Prostate cancer Paternal Grandfather   . Pancreatic cancer Paternal Grandfather   . Lung cancer Paternal Grandfather   . Bladder Cancer Neg Hx     SOCIAL HX: reviewed.    Current Outpatient Medications:  .  acetaminophen (TYLENOL) 500 MG tablet, Take 1,000 mg by mouth every 6 (six) hours as needed for moderate pain., Disp: , Rfl:  .  ALPRAZolam (XANAX) 0.25 MG tablet, Take 1 tablet (0.25 mg total) by  mouth daily as needed for anxiety., Disp: 30 tablet, Rfl: 2 .  carbamide peroxide (DEBROX) 6.5 % OTIC solution, Place 5 drops into the left ear daily. Massage for approximately 5 minutes (Patient not taking: Reported on 10/20/2018), Disp: 15 mL, Rfl: 0 .  EPINEPHrine 0.3 mg/0.3 mL IJ SOAJ injection, , Disp: , Rfl:  .  famotidine (PEPCID) 40 MG tablet, Take by mouth., Disp: , Rfl:  .  omeprazole (PRILOSEC) 40 MG capsule, Take 40 mg by mouth daily., Disp: , Rfl:  .  SUMAtriptan (IMITREX) 100 MG tablet, , Disp: , Rfl:   EXAM:  GENERAL: alert, oriented, appears well and in no acute distress  HEENT: atraumatic, conjunttiva clear, no obvious abnormalities on inspection of external nose and ears  NECK: normal movements of the head and neck  LUNGS: on inspection no signs of  respiratory distress, breathing rate appears normal, no obvious gross SOB, gasping or wheezing  CV: no obvious cyanosis  PSYCH/NEURO: pleasant and cooperative, no obvious depression or anxiety, speech and thought processing grossly intact  ASSESSMENT AND PLAN:  Discussed the following assessment and plan:  Exposure to COVID-19 virus Possible exposure at work.  No fever.  No other symptoms.  Went to be tested today.  Discussed monitoring symptoms.  Discussed self quarantine.  Follow closely.  Call with update.      I discussed the assessment and treatment plan with the patient. The patient was provided an opportunity to ask questions and all were answered. The patient agreed with the plan and demonstrated an understanding of the instructions.   The patient was advised to call back or seek an in-person evaluation if the symptoms worsen or if the condition fails to improve as anticipated.   [name], MD

## 2018-11-20 LAB — NOVEL CORONAVIRUS, NAA: SARS-CoV-2, NAA: NOT DETECTED

## 2018-11-21 ENCOUNTER — Encounter: Payer: Self-pay | Admitting: Internal Medicine

## 2018-11-21 ENCOUNTER — Telehealth: Payer: Self-pay | Admitting: Internal Medicine

## 2018-11-21 DIAGNOSIS — Z20822 Contact with and (suspected) exposure to covid-19: Secondary | ICD-10-CM | POA: Insufficient documentation

## 2018-11-21 DIAGNOSIS — Z20828 Contact with and (suspected) exposure to other viral communicable diseases: Secondary | ICD-10-CM | POA: Insufficient documentation

## 2018-11-21 NOTE — Assessment & Plan Note (Signed)
Possible exposure at work.  No fever.  No other symptoms.  Went to be tested today.  Discussed monitoring symptoms.  Discussed self quarantine.  Follow closely.  Call with update.

## 2018-11-21 NOTE — Telephone Encounter (Signed)
Pt notified of negative covid test via my chart.   

## 2018-11-24 ENCOUNTER — Other Ambulatory Visit: Payer: Self-pay

## 2018-11-24 DIAGNOSIS — Z20822 Contact with and (suspected) exposure to covid-19: Secondary | ICD-10-CM

## 2018-11-25 DIAGNOSIS — J3089 Other allergic rhinitis: Secondary | ICD-10-CM | POA: Diagnosis not present

## 2018-11-25 DIAGNOSIS — J301 Allergic rhinitis due to pollen: Secondary | ICD-10-CM | POA: Diagnosis not present

## 2018-11-25 LAB — NOVEL CORONAVIRUS, NAA: SARS-CoV-2, NAA: NOT DETECTED

## 2018-11-30 DIAGNOSIS — J301 Allergic rhinitis due to pollen: Secondary | ICD-10-CM | POA: Diagnosis not present

## 2018-11-30 DIAGNOSIS — J3089 Other allergic rhinitis: Secondary | ICD-10-CM | POA: Diagnosis not present

## 2018-12-07 ENCOUNTER — Ambulatory Visit
Admission: RE | Admit: 2018-12-07 | Discharge: 2018-12-07 | Disposition: A | Discharge status: Home / Self Care | Payer: BC Managed Care – PPO | Source: Ambulatory Visit | Attending: Internal Medicine | Admitting: Internal Medicine

## 2018-12-07 DIAGNOSIS — Z1231 Encounter for screening mammogram for malignant neoplasm of breast: Secondary | ICD-10-CM | POA: Diagnosis not present

## 2018-12-07 DIAGNOSIS — J3089 Other allergic rhinitis: Secondary | ICD-10-CM | POA: Diagnosis not present

## 2018-12-07 DIAGNOSIS — J301 Allergic rhinitis due to pollen: Secondary | ICD-10-CM | POA: Diagnosis not present

## 2018-12-08 DIAGNOSIS — J301 Allergic rhinitis due to pollen: Secondary | ICD-10-CM | POA: Diagnosis not present

## 2018-12-09 DIAGNOSIS — J301 Allergic rhinitis due to pollen: Secondary | ICD-10-CM | POA: Diagnosis not present

## 2018-12-09 DIAGNOSIS — J3089 Other allergic rhinitis: Secondary | ICD-10-CM | POA: Diagnosis not present

## 2018-12-14 DIAGNOSIS — J3089 Other allergic rhinitis: Secondary | ICD-10-CM | POA: Diagnosis not present

## 2018-12-14 DIAGNOSIS — J301 Allergic rhinitis due to pollen: Secondary | ICD-10-CM | POA: Diagnosis not present

## 2018-12-22 ENCOUNTER — Encounter: Payer: Self-pay | Admitting: Internal Medicine

## 2018-12-22 ENCOUNTER — Ambulatory Visit (INDEPENDENT_AMBULATORY_CARE_PROVIDER_SITE_OTHER): Payer: BC Managed Care – PPO | Admitting: Internal Medicine

## 2018-12-22 ENCOUNTER — Other Ambulatory Visit: Payer: BC Managed Care – PPO

## 2018-12-22 ENCOUNTER — Other Ambulatory Visit: Payer: Self-pay

## 2018-12-22 DIAGNOSIS — R350 Frequency of micturition: Secondary | ICD-10-CM

## 2018-12-22 DIAGNOSIS — R300 Dysuria: Secondary | ICD-10-CM

## 2018-12-22 NOTE — Telephone Encounter (Signed)
Patient scheduled for virtual visit today at 21 and will be coming in to leave urine sample after that.

## 2018-12-22 NOTE — Progress Notes (Signed)
Patient ID: Shannon [name], female   DOB: Jun 08, 1965, 53 y.o.   MRN: [id number]   Virtual Visit via video Note  This visit type was conducted due to national recommendations for restrictions regarding the COVID-19 pandemic (e.g. social distancing).  This format is felt to be most appropriate for this patient at this time.  All issues noted in this document were discussed and addressed.  No physical exam was performed (except for noted visual exam findings with Video Visits).   I connected with Terisa Starr by a video enabled telemedicine application and verified that I am speaking with the correct person using two identifiers. Location patient: home Location provider: work Persons participating in the virtual visit: patient, provider  I discussed the limitations, risks, security and privacy concerns of performing an evaluation and management service by video and the availability of in person appointments.  The patient expressed understanding and agreed to proceed.   Reason for visit: work in appt  HPI: She reports that starting 2-3 days ago, she noticed some discomfort - bladder.  Some low back discomfort.  Some increased urinary frequency.  Did notice a white discharge 2 days ago.  No vaginal symptoms now.  No hematuria.  No nausea or vomiting.  No abdominal pain.  Has increased water intake and took AZO.  Does feel better.  No fever.     ROS: See pertinent positives and negatives per HPI.  Past Medical History:  Diagnosis Date  . Anemia   . Anxiety   . Breast pain, right 11/07/2012  . Breathing difficulty 06/20/2014  . Environmental allergies   . GERD (gastroesophageal reflux disease)   . Hiatal hernia    small  . History of migraine headaches   . Hyperthyroidism    s/p ablation  . Vaginitis 01/16/2013    Past Surgical History:  Procedure Laterality Date  . BREAST BIOPSY Right 12/05/2015   neg  . CESAREAN SECTION  11/98   Dr Roena Malady  . CHOLECYSTECTOMY  11/05   Dr Pat Patrick   . JOINT REPLACEMENT Left 2017   hip  . NASAL SINUS SURGERY  11/93  . SEPTOPLASTY  5/90   Dr Rossie Muskrat  . TUBAL LIGATION  02/1998  . UMBILICAL HERNIA REPAIR  02/1998    Family History  Problem Relation Age of Onset  . Heart disease Father   . Hypertension Father   . Thyroid disease Father   . Hypercholesterolemia Father   . Kidney cancer Father   . Leukemia Father        hairy cell  . Hypercholesterolemia Mother   . Thyroid disease Mother   . Breast cancer Mother 69  . Breast cancer Other        maternal great grandmother  . Heart disease Maternal Grandfather        MI - age 18  . Prostate cancer Paternal Grandfather   . Pancreatic cancer Paternal Grandfather   . Lung cancer Paternal Grandfather   . Bladder Cancer Neg Hx     SOCIAL HX: reviewed.    Current Outpatient Medications:  .  acetaminophen (TYLENOL) 500 MG tablet, Take 1,000 mg by mouth every 6 (six) hours as needed for moderate pain., Disp: , Rfl:  .  ALPRAZolam (XANAX) 0.25 MG tablet, Take 1 tablet (0.25 mg total) by mouth daily as needed for anxiety., Disp: 30 tablet, Rfl: 2 .  carbamide peroxide (DEBROX) 6.5 % OTIC solution, Place 5 drops into the left ear daily. Massage for approximately 5  minutes (Patient not taking: Reported on 10/20/2018), Disp: 15 mL, Rfl: 0 .  EPINEPHrine 0.3 mg/0.3 mL IJ SOAJ injection, , Disp: , Rfl:  .  famotidine (PEPCID) 40 MG tablet, Take by mouth., Disp: , Rfl:  .  omeprazole (PRILOSEC) 40 MG capsule, Take 40 mg by mouth daily., Disp: , Rfl:  .  SUMAtriptan (IMITREX) 100 MG tablet, , Disp: , Rfl:   EXAM:  GENERAL: alert, oriented, appears well and in no acute distress  HEENT: atraumatic, conjunttiva clear, no obvious abnormalities on inspection of external nose and ears  NECK: normal movements of the head and neck  LUNGS: on inspection no signs of respiratory distress, breathing rate appears normal, no obvious gross SOB, gasping or wheezing  CV: no obvious cyanosis   PSYCH/NEURO: pleasant and cooperative, no obvious depression or anxiety, speech and thought processing grossly intact  ASSESSMENT AND PLAN:  Discussed the following assessment and plan:  Urinary frequency Increased urinary frequency as outlined.  Some previous discomfort - bladder.  AZO and increased fluids - better.  Will send urine for culture.  Hold abx until culture results available.  No vaginal symptoms now.  Follow.      I discussed the assessment and treatment plan with the patient. The patient was provided an opportunity to ask questions and all were answered. The patient agreed with the plan and demonstrated an understanding of the instructions.   The patient was advised to call back or seek an in-person evaluation if the symptoms worsen or if the condition fails to improve as anticipated.   [name], MD

## 2018-12-23 ENCOUNTER — Encounter: Payer: Self-pay | Admitting: Internal Medicine

## 2018-12-24 ENCOUNTER — Encounter: Payer: Self-pay | Admitting: Internal Medicine

## 2018-12-24 LAB — URINE CULTURE
MICRO NUMBER:: 1139430
Result:: NO GROWTH
SPECIMEN QUALITY:: ADEQUATE

## 2018-12-26 ENCOUNTER — Encounter: Payer: Self-pay | Admitting: Internal Medicine

## 2018-12-26 DIAGNOSIS — R350 Frequency of micturition: Secondary | ICD-10-CM | POA: Insufficient documentation

## 2018-12-26 NOTE — Assessment & Plan Note (Signed)
Increased urinary frequency as outlined.  Some previous discomfort - bladder.  AZO and increased fluids - better.  Will send urine for culture.  Hold abx until culture results available.  No vaginal symptoms now.  Follow.

## 2018-12-28 DIAGNOSIS — J3089 Other allergic rhinitis: Secondary | ICD-10-CM | POA: Diagnosis not present

## 2018-12-28 DIAGNOSIS — J301 Allergic rhinitis due to pollen: Secondary | ICD-10-CM | POA: Diagnosis not present

## 2019-01-04 DIAGNOSIS — J301 Allergic rhinitis due to pollen: Secondary | ICD-10-CM | POA: Diagnosis not present

## 2019-01-04 DIAGNOSIS — J3089 Other allergic rhinitis: Secondary | ICD-10-CM | POA: Diagnosis not present

## 2019-01-07 ENCOUNTER — Encounter: Payer: Self-pay | Admitting: Internal Medicine

## 2019-01-08 DIAGNOSIS — J069 Acute upper respiratory infection, unspecified: Secondary | ICD-10-CM | POA: Diagnosis not present

## 2019-01-08 DIAGNOSIS — U071 COVID-19: Secondary | ICD-10-CM | POA: Diagnosis not present

## 2019-01-11 ENCOUNTER — Encounter: Payer: Self-pay | Admitting: Internal Medicine

## 2019-01-13 ENCOUNTER — Telehealth: Payer: Self-pay | Admitting: Internal Medicine

## 2019-01-13 ENCOUNTER — Other Ambulatory Visit: Payer: Self-pay

## 2019-01-13 ENCOUNTER — Encounter: Payer: Self-pay | Admitting: Internal Medicine

## 2019-01-13 ENCOUNTER — Ambulatory Visit (INDEPENDENT_AMBULATORY_CARE_PROVIDER_SITE_OTHER): Payer: BC Managed Care – PPO | Admitting: Internal Medicine

## 2019-01-13 DIAGNOSIS — U071 COVID-19: Secondary | ICD-10-CM | POA: Diagnosis not present

## 2019-01-13 NOTE — Telephone Encounter (Signed)
My chart message sent to pt for update.   

## 2019-01-13 NOTE — Progress Notes (Signed)
Patient ID: Shannon [name], female   DOB: 02/10/1965, 53 y.o.   MRN: [id number]   Virtual Visit via video Note  This visit type was conducted due to national recommendations for restrictions regarding the COVID-19 pandemic (e.g. social distancing).  This format is felt to be most appropriate for this patient at this time.  All issues noted in this document were discussed and addressed.  No physical exam was performed (except for noted visual exam findings with Video Visits).   I connected with Shannon [name] by a video enabled telemedicine application and verified that I am speaking with the correct person using two identifiers. Location patient: home Location provider: work  Persons participating in the virtual visit: patient, provider  The limitations, risks, security and privacy concerns of performing an evaluation and management service by video and the availability of in person appointments have been discussed.  The patient expressed understanding and agreed to proceed.   Reason for visit: work in appt  HPI: Was diagnosed with covid 01/08/19.  Her husband and daughter also tested positive.  Noticed head congestion.  Some chest tightness.  Some congestion.  No fever.  No nausea, vomiting or diarrhea.  Is eating.  No loss of taste or smell.  Is self quarantined.     ROS: See pertinent positives and negatives per HPI.  Past Medical History:  Diagnosis Date  . Anemia   . Anxiety   . Breast pain, right 11/07/2012  . Breathing difficulty 06/20/2014  . Environmental allergies   . GERD (gastroesophageal reflux disease)   . Hiatal hernia    small  . History of migraine headaches   . Hyperthyroidism    s/p ablation  . Vaginitis 01/16/2013    Past Surgical History:  Procedure Laterality Date  . BREAST BIOPSY Right 12/05/2015   neg  . CESAREAN SECTION  11/98   Dr Roena Malady  . CHOLECYSTECTOMY  11/05   Dr Pat Patrick  . JOINT REPLACEMENT Left 2017   hip  . NASAL SINUS SURGERY  11/93  .  SEPTOPLASTY  5/90   Dr Rossie Muskrat  . TUBAL LIGATION  02/1998  . UMBILICAL HERNIA REPAIR  02/1998    Family History  Problem Relation Age of Onset  . Heart disease Father   . Hypertension Father   . Thyroid disease Father   . Hypercholesterolemia Father   . Kidney cancer Father   . Leukemia Father        hairy cell  . Hypercholesterolemia Mother   . Thyroid disease Mother   . Breast cancer Mother 20  . Breast cancer Other        maternal great grandmother  . Heart disease Maternal Grandfather        MI - age 35  . Prostate cancer Paternal Grandfather   . Pancreatic cancer Paternal Grandfather   . Lung cancer Paternal Grandfather   . Bladder Cancer Neg Hx     SOCIAL HX: reviewed.    Current Outpatient Medications:  .  acetaminophen (TYLENOL) 500 MG tablet, Take 1,000 mg by mouth every 6 (six) hours as needed for moderate pain., Disp: , Rfl:  .  ALPRAZolam (XANAX) 0.25 MG tablet, Take 1 tablet (0.25 mg total) by mouth daily as needed for anxiety., Disp: 30 tablet, Rfl: 2 .  carbamide peroxide (DEBROX) 6.5 % OTIC solution, Place 5 drops into the left ear daily. Massage for approximately 5 minutes (Patient not taking: Reported on 10/20/2018), Disp: 15 mL, Rfl: 0 .  EPINEPHrine  0.3 mg/0.3 mL IJ SOAJ injection, , Disp: , Rfl:  .  famotidine (PEPCID) 40 MG tablet, Take by mouth., Disp: , Rfl:  .  omeprazole (PRILOSEC) 40 MG capsule, Take 40 mg by mouth daily., Disp: , Rfl:  .  SUMAtriptan (IMITREX) 100 MG tablet, , Disp: , Rfl:   EXAM:  GENERAL: alert, oriented, appears well and in no acute distress  HEENT: atraumatic, conjunttiva clear, no obvious abnormalities on inspection of external nose and ears  NECK: normal movements of the head and neck  LUNGS: on inspection no signs of respiratory distress, breathing rate appears normal, no obvious gross SOB, gasping or wheezing  CV: no obvious cyanosis  PSYCH/NEURO: pleasant and cooperative, no obvious depression or anxiety, speech and  thought processing grossly intact  ASSESSMENT AND PLAN:  Discussed the following assessment and plan:  COVID-19 virus infection Tested positive 01/08/19.  No sob.  Some chest tightness and symptoms as outlined.  Eating.  Treat symptoms.  Rest.  Fluids.  Continue self quarantine as discussed.  Follow. Have her update me over the next 1-2 days.      I discussed the assessment and treatment plan with the patient. The patient was provided an opportunity to ask questions and all were answered. The patient agreed with the plan and demonstrated an understanding of the instructions.   The patient was advised to call back or seek an in-person evaluation if the symptoms worsen or if the condition fails to improve as anticipated.   [name], MD

## 2019-01-16 ENCOUNTER — Encounter: Payer: Self-pay | Admitting: Internal Medicine

## 2019-01-16 DIAGNOSIS — U071 COVID-19: Secondary | ICD-10-CM | POA: Insufficient documentation

## 2019-01-16 NOTE — Assessment & Plan Note (Signed)
Tested positive 01/08/19.  No sob.  Some chest tightness and symptoms as outlined.  Eating.  Treat symptoms.  Rest.  Fluids.  Continue self quarantine as discussed.  Follow. Have her update me over the next 1-2 days.

## 2019-01-25 DIAGNOSIS — J301 Allergic rhinitis due to pollen: Secondary | ICD-10-CM | POA: Diagnosis not present

## 2019-01-25 DIAGNOSIS — J3089 Other allergic rhinitis: Secondary | ICD-10-CM | POA: Diagnosis not present

## 2019-01-27 DIAGNOSIS — J301 Allergic rhinitis due to pollen: Secondary | ICD-10-CM | POA: Diagnosis not present

## 2019-01-27 DIAGNOSIS — J3089 Other allergic rhinitis: Secondary | ICD-10-CM | POA: Diagnosis not present

## 2019-02-01 DIAGNOSIS — J301 Allergic rhinitis due to pollen: Secondary | ICD-10-CM | POA: Diagnosis not present

## 2019-02-01 DIAGNOSIS — J3089 Other allergic rhinitis: Secondary | ICD-10-CM | POA: Diagnosis not present

## 2019-02-02 DIAGNOSIS — H16223 Keratoconjunctivitis sicca, not specified as Sjogren's, bilateral: Secondary | ICD-10-CM | POA: Diagnosis not present

## 2019-02-03 DIAGNOSIS — J3089 Other allergic rhinitis: Secondary | ICD-10-CM | POA: Diagnosis not present

## 2019-02-03 DIAGNOSIS — R05 Cough: Secondary | ICD-10-CM | POA: Diagnosis not present

## 2019-02-03 DIAGNOSIS — J301 Allergic rhinitis due to pollen: Secondary | ICD-10-CM | POA: Diagnosis not present

## 2019-02-03 DIAGNOSIS — H1045 Other chronic allergic conjunctivitis: Secondary | ICD-10-CM | POA: Diagnosis not present

## 2019-02-07 DIAGNOSIS — G43839 Menstrual migraine, intractable, without status migrainosus: Secondary | ICD-10-CM | POA: Diagnosis not present

## 2019-02-07 DIAGNOSIS — G43019 Migraine without aura, intractable, without status migrainosus: Secondary | ICD-10-CM | POA: Diagnosis not present

## 2019-02-08 ENCOUNTER — Encounter: Payer: Self-pay | Admitting: Internal Medicine

## 2019-02-09 ENCOUNTER — Other Ambulatory Visit: Payer: Self-pay

## 2019-02-09 MED ORDER — ALPRAZOLAM 0.25 MG PO TABS: 0.2500 mg | ORAL_TABLET | Freq: Every day | ORAL | 2 refills | Status: DC | PRN

## 2019-02-09 NOTE — Telephone Encounter (Signed)
Last refill 11/13/18 LOV 01/13/19 Next visit 03/10/2019

## 2019-02-09 NOTE — Telephone Encounter (Signed)
rx sent in for xanax #30 with 2 refills

## 2019-02-10 DIAGNOSIS — J301 Allergic rhinitis due to pollen: Secondary | ICD-10-CM | POA: Diagnosis not present

## 2019-02-10 DIAGNOSIS — J3089 Other allergic rhinitis: Secondary | ICD-10-CM | POA: Diagnosis not present

## 2019-02-15 DIAGNOSIS — J301 Allergic rhinitis due to pollen: Secondary | ICD-10-CM | POA: Diagnosis not present

## 2019-02-15 DIAGNOSIS — J3089 Other allergic rhinitis: Secondary | ICD-10-CM | POA: Diagnosis not present

## 2019-02-24 DIAGNOSIS — J3089 Other allergic rhinitis: Secondary | ICD-10-CM | POA: Diagnosis not present

## 2019-02-24 DIAGNOSIS — J301 Allergic rhinitis due to pollen: Secondary | ICD-10-CM | POA: Diagnosis not present

## 2019-02-24 DIAGNOSIS — H25813 Combined forms of age-related cataract, bilateral: Secondary | ICD-10-CM | POA: Diagnosis not present

## 2019-03-01 DIAGNOSIS — J301 Allergic rhinitis due to pollen: Secondary | ICD-10-CM | POA: Diagnosis not present

## 2019-03-01 DIAGNOSIS — J3089 Other allergic rhinitis: Secondary | ICD-10-CM | POA: Diagnosis not present

## 2019-03-08 DIAGNOSIS — J301 Allergic rhinitis due to pollen: Secondary | ICD-10-CM | POA: Diagnosis not present

## 2019-03-08 DIAGNOSIS — J3089 Other allergic rhinitis: Secondary | ICD-10-CM | POA: Diagnosis not present

## 2019-03-10 ENCOUNTER — Encounter: Payer: Self-pay | Admitting: Internal Medicine

## 2019-03-10 ENCOUNTER — Ambulatory Visit (INDEPENDENT_AMBULATORY_CARE_PROVIDER_SITE_OTHER): Payer: BC Managed Care – PPO | Admitting: Internal Medicine

## 2019-03-10 ENCOUNTER — Other Ambulatory Visit (HOSPITAL_COMMUNITY)
Admission: RE | Admit: 2019-03-10 | Discharge: 2019-03-10 | Disposition: A | Discharge status: Home / Self Care | Payer: BC Managed Care – PPO | Source: Ambulatory Visit | Attending: Internal Medicine | Admitting: Internal Medicine

## 2019-03-10 ENCOUNTER — Other Ambulatory Visit: Payer: Self-pay

## 2019-03-10 VITALS — BP 118/72 | HR 78 | Temp 97.5°F | Resp 16 | Ht 62.0 in | Wt 125.4 lb

## 2019-03-10 DIAGNOSIS — Z0000 Encounter for general adult medical examination without abnormal findings: Secondary | ICD-10-CM | POA: Diagnosis not present

## 2019-03-10 DIAGNOSIS — K219 Gastro-esophageal reflux disease without esophagitis: Secondary | ICD-10-CM

## 2019-03-10 DIAGNOSIS — Z13220 Encounter for screening for lipoid disorders: Secondary | ICD-10-CM | POA: Diagnosis not present

## 2019-03-10 DIAGNOSIS — Z124 Encounter for screening for malignant neoplasm of cervix: Secondary | ICD-10-CM

## 2019-03-10 DIAGNOSIS — J3081 Allergic rhinitis due to animal (cat) (dog) hair and dander: Secondary | ICD-10-CM | POA: Diagnosis not present

## 2019-03-10 DIAGNOSIS — M48061 Spinal stenosis, lumbar region without neurogenic claudication: Secondary | ICD-10-CM

## 2019-03-10 DIAGNOSIS — J301 Allergic rhinitis due to pollen: Secondary | ICD-10-CM | POA: Diagnosis not present

## 2019-03-10 DIAGNOSIS — E0590 Thyrotoxicosis, unspecified without thyrotoxic crisis or storm: Secondary | ICD-10-CM

## 2019-03-10 DIAGNOSIS — E559 Vitamin D deficiency, unspecified: Secondary | ICD-10-CM

## 2019-03-10 DIAGNOSIS — D649 Anemia, unspecified: Secondary | ICD-10-CM

## 2019-03-10 DIAGNOSIS — F419 Anxiety disorder, unspecified: Secondary | ICD-10-CM | POA: Diagnosis not present

## 2019-03-10 DIAGNOSIS — M79661 Pain in right lower leg: Secondary | ICD-10-CM

## 2019-03-10 DIAGNOSIS — R2230 Localized swelling, mass and lump, unspecified upper limb: Secondary | ICD-10-CM

## 2019-03-10 DIAGNOSIS — J3089 Other allergic rhinitis: Secondary | ICD-10-CM | POA: Diagnosis not present

## 2019-03-10 DIAGNOSIS — R222 Localized swelling, mass and lump, trunk: Secondary | ICD-10-CM

## 2019-03-10 NOTE — Assessment & Plan Note (Signed)
Physical today 03/10/19.  PAP 03/10/19.  Colonoscopy 11/2014.  Recommended f/u in 11/2024.

## 2019-03-10 NOTE — Progress Notes (Signed)
Patient ID: Shannon [name], female   DOB: 1965/09/10, 54 y.o.   MRN: [id number]   Subjective:    Patient ID: Shannon [name], female    DOB: 1965-06-28, 54 y.o.   MRN: [id number]  HPI This visit occurred during the SARS-CoV-2 public health emergency.  Safety protocols were in place, including screening questions prior to the visit, additional usage of staff PPE, and extensive cleaning of exam room while observing appropriate contact time as indicated for disinfecting solutions.  Patient here for a physical exam.  She reports she is doing relatively well.  Handling stress.  Tries to stay active.  No chest pain or sob.  No acid reflux.  No abdominal pain.  Bowels moving.  Does report fullness - right axilla.  Persistent.  Had mammogram 12/08/18.  Also reports significant calf cramp - right leg 2-3 months ago. Persistent pain in calf since.  Able to stay active and exercise, but has had persistent pain.  Back stable.  No vaginal symptoms.     Past Medical History:  Diagnosis Date  . Anemia   . Anxiety   . Breast pain, right 11/07/2012  . Breathing difficulty 06/20/2014  . Environmental allergies   . GERD (gastroesophageal reflux disease)   . Hiatal hernia    small  . History of migraine headaches   . Hyperthyroidism    s/p ablation  . Vaginitis 01/16/2013   Past Surgical History:  Procedure Laterality Date  . BREAST BIOPSY Right 12/05/2015   neg  . CESAREAN SECTION  11/98   Dr Roena Malady  . CHOLECYSTECTOMY  11/05   Dr Pat Patrick  . JOINT REPLACEMENT Left 2017   hip  . NASAL SINUS SURGERY  11/93  . SEPTOPLASTY  5/90   Dr Rossie Muskrat  . TUBAL LIGATION  02/1998  . UMBILICAL HERNIA REPAIR  02/1998   Family History  Problem Relation Age of Onset  . Heart disease Father   . Hypertension Father   . Thyroid disease Father   . Hypercholesterolemia Father   . Kidney cancer Father   . Leukemia Father        hairy cell  . Hypercholesterolemia Mother   . Thyroid disease Mother   . Breast cancer  Mother 60  . Breast cancer Other        maternal great grandmother  . Heart disease Maternal Grandfather        MI - age 56  . Prostate cancer Paternal Grandfather   . Pancreatic cancer Paternal Grandfather   . Lung cancer Paternal Grandfather   . Bladder Cancer Neg Hx    Social History   Socioeconomic History  . Marital status: Married    Spouse name: Not on file  . Number of children: 1  . Years of education: Not on file  . Highest education level: Not on file  Occupational History  . Not on file  Tobacco Use  . Smoking status: Former Smoker    Quit date: 01/28/2000    Years since quitting: 19.1  . Smokeless tobacco: Never Used  Substance and Sexual Activity  . Alcohol use: No    Alcohol/week: 0.0 standard drinks  . Drug use: No  . Sexual activity: Not on file  Other Topics Concern  . Not on file  Social History Narrative  . Not on file   Social Determinants of Health   Financial Resource Strain:   . Difficulty of Paying Living Expenses: Not on file  Food Insecurity:   .  Worried About Charity fundraiser in the Last Year: Not on file  . Ran Out of Food in the Last Year: Not on file  Transportation Needs:   . Lack of Transportation (Medical): Not on file  . Lack of Transportation (Non-Medical): Not on file  Physical Activity:   . Days of Exercise per Week: Not on file  . Minutes of Exercise per Session: Not on file  Stress:   . Feeling of Stress : Not on file  Social Connections:   . Frequency of Communication with Friends and Family: Not on file  . Frequency of Social Gatherings with Friends and Family: Not on file  . Attends Religious Services: Not on file  . Active Member of Clubs or Organizations: Not on file  . Attends Archivist Meetings: Not on file  . Marital Status: Not on file    Outpatient Encounter Medications as of 03/10/2019  Medication Sig  . acetaminophen (TYLENOL) 500 MG tablet Take 1,000 mg by mouth every 6 (six) hours as needed  for moderate pain.  Marland Kitchen ALPRAZolam (XANAX) 0.25 MG tablet Take 1 tablet (0.25 mg total) by mouth daily as needed for anxiety.  Marland Kitchen EPINEPHrine 0.3 mg/0.3 mL IJ SOAJ injection   . famotidine (PEPCID) 40 MG tablet Take by mouth.  Marland Kitchen omeprazole (PRILOSEC) 40 MG capsule Take 40 mg by mouth daily.  . SUMAtriptan (IMITREX) 100 MG tablet   . [DISCONTINUED] carbamide peroxide (DEBROX) 6.5 % OTIC solution Place 5 drops into the left ear daily. Massage for approximately 5 minutes (Patient not taking: Reported on 10/20/2018)   No facility-administered encounter medications on file as of 03/10/2019.   Review of Systems  Constitutional: Negative for appetite change and unexpected weight change.  HENT: Negative for congestion and sinus pressure.   Eyes: Negative for pain and visual disturbance.  Respiratory: Negative for cough, chest tightness and shortness of breath.   Cardiovascular: Negative for chest pain, palpitations and leg swelling.  Gastrointestinal: Negative for abdominal pain, diarrhea, nausea and vomiting.  Genitourinary: Negative for difficulty urinating and dysuria.  Musculoskeletal: Negative for joint swelling and myalgias.  Skin: Negative for color change and rash.  Neurological: Negative for dizziness, light-headedness and headaches.  Hematological: Negative for adenopathy. Does not bruise/bleed easily.  Psychiatric/Behavioral: Negative for agitation and dysphoric mood.       Objective:    Physical Exam Constitutional:      General: She is not in acute distress.    Appearance: Normal appearance. She is well-developed.  HENT:     Head: Normocephalic and atraumatic.     Right Ear: External ear normal.     Left Ear: External ear normal.  Eyes:     General: No scleral icterus.       Right eye: No discharge.        Left eye: No discharge.     Conjunctiva/sclera: Conjunctivae normal.  Neck:     Thyroid: No thyromegaly.  Cardiovascular:     Rate and Rhythm: Normal rate and regular  rhythm.  Pulmonary:     Effort: No tachypnea, accessory muscle usage or respiratory distress.     Breath sounds: Normal breath sounds. No decreased breath sounds or wheezing.     Comments: Fullness - right axilla.  Chest:     Breasts:        Right: No inverted nipple, mass, nipple discharge or tenderness (no axillary adenopathy).        Left: No inverted nipple, mass, nipple discharge  or tenderness (no axilarry adenopathy).  Abdominal:     General: Bowel sounds are normal.     Palpations: Abdomen is soft.     Tenderness: There is no abdominal tenderness.  Genitourinary:    Comments: Normal external genitalia.  Vaginal vault without lesions.  Cervix identified.  Pap smear performed.  Could not appreciate any adnexal masses or tenderness.   Musculoskeletal:        General: No swelling or tenderness.     Cervical back: Neck supple. No tenderness.     Comments: Pain to palpation -right calf.  No redness or swelling.   Lymphadenopathy:     Cervical: No cervical adenopathy.  Skin:    General: Skin is warm.     Findings: No erythema or rash.  Neurological:     Mental Status: She is alert and oriented to person, place, and time.  Psychiatric:        Mood and Affect: Mood normal.        Behavior: Behavior normal.     BP 118/72   Pulse 78   Temp (!) 97.5 F (36.4 C)   Resp 16   Ht 5\' 2"  (1.575 m)   Wt 125 lb 6.4 oz (56.9 kg)   LMP 02/12/2013   SpO2 99%   BMI 22.94 kg/m  Wt Readings from Last 3 Encounters:  03/10/19 125 lb 6.4 oz (56.9 kg)  01/13/19 121 lb 4.8 oz (55 kg)  11/19/18 124 lb (56.2 kg)     Lab Results  Component Value Date   WBC 6.5 03/08/2018   HGB 13.4 03/08/2018   HCT 39.4 03/08/2018   PLT 253.0 03/08/2018   GLUCOSE 73 03/08/2018   CHOL 187 02/26/2017   TRIG 177.0 (H) 02/26/2017   HDL 64.00 02/26/2017   LDLCALC 88 02/26/2017   ALT 26 03/08/2018   AST 25 03/08/2018   NA 138 03/08/2018   K 4.1 03/08/2018   CL 101 03/08/2018   CREATININE 0.73  03/08/2018   BUN 12 03/08/2018   CO2 28 03/08/2018   TSH 1.36 03/08/2018    MM 3D SCREEN BREAST BILATERAL  Result Date: 12/08/2018 CLINICAL DATA:  Screening. EXAM: DIGITAL SCREENING BILATERAL MAMMOGRAM WITH TOMO AND CAD COMPARISON:  Previous exam(s). ACR Breast Density Category c: The breast tissue is heterogeneously dense, which may obscure small masses. FINDINGS: There are no findings suspicious for malignancy. Images were processed with CAD. IMPRESSION: No mammographic evidence of malignancy. A result letter of this screening mammogram will be mailed directly to the patient. RECOMMENDATION: Screening mammogram in one year. (Code:SM-B-01Y) BI-RADS CATEGORY  1: Negative. Electronically Signed   By: Audie Pinto M.D.   On: 12/08/2018 13:12       Assessment & Plan:   Problem List Items Addressed This Visit    Anemia    Follow cbc.       Anxiety    Doing well.  Stable.  Takes xanax.  Follow.        Axillary fullness    Schedule ultrasound - axilla.        Relevant Orders   [place] AXILLA RIGHT   Gastroesophageal reflux disease without esophagitis    Controlled on current regimen.  Follow.       Health care maintenance    Physical today 03/10/19.  PAP 03/10/19.  Colonoscopy 11/2014.  Recommended f/u in 11/2024.      Hyperthyroidism    S/p ablation.  Follow tsh.       Relevant Orders  CBC with Differential/Platelet   Comprehensive metabolic panel   TSH   Lumbar lateral recess stenosis (L4-5) (Left) (Chronic)    Has been followed by pain clinic.  S/p injections.  Stable.        Right calf pain    Persistent pain.  Check ultrasound - soft tissue.        Relevant Orders   [place] RT LOWER EXTREM LTD SOFT TISSUE NON VASCULAR   Vitamin B12   Vitamin D insufficiency    Follow vitamin D level.       Relevant Orders   VITAMIN D 25 Hydroxy (Vit-D Deficiency, Fractures)    Other Visit Diagnoses    Routine general medical examination at a health care facility    -  Primary    Cervical cancer screening       Relevant Orders   Cytology - PAP( [facility])   Screening cholesterol level       Relevant Orders   Lipid panel       [name], MD

## 2019-03-13 ENCOUNTER — Encounter: Payer: Self-pay | Admitting: Internal Medicine

## 2019-03-13 DIAGNOSIS — R2230 Localized swelling, mass and lump, unspecified upper limb: Secondary | ICD-10-CM | POA: Insufficient documentation

## 2019-03-13 DIAGNOSIS — M79661 Pain in right lower leg: Secondary | ICD-10-CM | POA: Insufficient documentation

## 2019-03-13 DIAGNOSIS — M79621 Pain in right upper arm: Secondary | ICD-10-CM | POA: Insufficient documentation

## 2019-03-13 DIAGNOSIS — R222 Localized swelling, mass and lump, trunk: Secondary | ICD-10-CM | POA: Insufficient documentation

## 2019-03-13 NOTE — Assessment & Plan Note (Signed)
Controlled on current regimen.  Follow.  

## 2019-03-13 NOTE — Assessment & Plan Note (Signed)
Has been followed by pain clinic.  S/p injections.  Stable.

## 2019-03-13 NOTE — Assessment & Plan Note (Signed)
Follow cbc.  

## 2019-03-13 NOTE — Assessment & Plan Note (Signed)
Persistent pain.  Check ultrasound - soft tissue.

## 2019-03-13 NOTE — Assessment & Plan Note (Signed)
Doing well.  Stable.  Takes xanax.  Follow.

## 2019-03-13 NOTE — Assessment & Plan Note (Signed)
Schedule ultrasound - axilla.   

## 2019-03-13 NOTE — Assessment & Plan Note (Signed)
Follow vitamin D level.  

## 2019-03-13 NOTE — Assessment & Plan Note (Signed)
S/p ablation.  Follow tsh.

## 2019-03-14 LAB — CYTOLOGY - PAP
Comment: NEGATIVE
Diagnosis: NEGATIVE
High risk HPV: NEGATIVE

## 2019-03-15 ENCOUNTER — Encounter: Payer: Self-pay | Admitting: Internal Medicine

## 2019-03-15 DIAGNOSIS — J301 Allergic rhinitis due to pollen: Secondary | ICD-10-CM | POA: Diagnosis not present

## 2019-03-15 DIAGNOSIS — J3089 Other allergic rhinitis: Secondary | ICD-10-CM | POA: Diagnosis not present

## 2019-03-16 NOTE — Telephone Encounter (Signed)
Pt calling about getting the ultrasound appts scheduled.  Thanks.

## 2019-03-21 ENCOUNTER — Other Ambulatory Visit: Payer: Self-pay

## 2019-03-21 ENCOUNTER — Other Ambulatory Visit (INDEPENDENT_AMBULATORY_CARE_PROVIDER_SITE_OTHER): Payer: BC Managed Care – PPO

## 2019-03-21 DIAGNOSIS — M79661 Pain in right lower leg: Secondary | ICD-10-CM

## 2019-03-21 DIAGNOSIS — E559 Vitamin D deficiency, unspecified: Secondary | ICD-10-CM

## 2019-03-21 DIAGNOSIS — E0590 Thyrotoxicosis, unspecified without thyrotoxic crisis or storm: Secondary | ICD-10-CM | POA: Diagnosis not present

## 2019-03-21 DIAGNOSIS — Z13220 Encounter for screening for lipoid disorders: Secondary | ICD-10-CM

## 2019-03-21 LAB — LIPID PANEL
Cholesterol: 240 mg/dL — ABNORMAL HIGH (ref 0–200)
HDL: 90 mg/dL (ref >39.00)
LDL Cholesterol: 123 mg/dL — ABNORMAL HIGH (ref 0–99)
NonHDL: 149.66
Total CHOL/HDL Ratio: 3
Triglycerides: 132 mg/dL (ref 0.0–149.0)
VLDL: 26.4 mg/dL (ref 0.0–40.0)

## 2019-03-21 LAB — COMPREHENSIVE METABOLIC PANEL
ALT: 16 U/L (ref 0–35)
AST: 16 U/L (ref 0–37)
Albumin: 4.4 g/dL (ref 3.5–5.2)
Alkaline Phosphatase: 63 U/L (ref 39–117)
BUN: 10 mg/dL (ref 6–23)
CO2: 27 mEq/L (ref 19–32)
Calcium: 9.9 mg/dL (ref 8.4–10.5)
Chloride: 103 mEq/L (ref 96–112)
Creatinine, Ser: 0.73 mg/dL (ref 0.40–1.20)
GFR: 82.61 mL/min (ref >60.00)
Glucose, Bld: 91 mg/dL (ref 70–99)
Potassium: 3.7 mEq/L (ref 3.5–5.1)
Sodium: 140 mEq/L (ref 135–145)
Total Bilirubin: 0.4 mg/dL (ref 0.2–1.2)
Total Protein: 7 g/dL (ref 6.0–8.3)

## 2019-03-21 LAB — CBC WITH DIFFERENTIAL/PLATELET
Basophils Absolute: 0.1 10*3/uL (ref 0.0–0.1)
Basophils Relative: 1.3 % (ref 0.0–3.0)
Eosinophils Absolute: 0.1 10*3/uL (ref 0.0–0.7)
Eosinophils Relative: 2.6 % (ref 0.0–5.0)
HCT: 41 % (ref 36.0–46.0)
Hemoglobin: 13.5 g/dL (ref 12.0–15.0)
Lymphocytes Relative: 30.8 % (ref 12.0–46.0)
Lymphs Abs: 1.6 10*3/uL (ref 0.7–4.0)
MCHC: 33 g/dL (ref 30.0–36.0)
MCV: 91.1 fl (ref 78.0–100.0)
Monocytes Absolute: 0.4 10*3/uL (ref 0.1–1.0)
Monocytes Relative: 7 % (ref 3.0–12.0)
Neutro Abs: 3 10*3/uL (ref 1.4–7.7)
Neutrophils Relative %: 58.3 % (ref 43.0–77.0)
Platelets: 242 10*3/uL (ref 150.0–400.0)
RBC: 4.5 Mil/uL (ref 3.87–5.11)
RDW: 13.5 % (ref 11.5–15.5)
WBC: 5.1 10*3/uL (ref 4.0–10.5)

## 2019-03-21 LAB — TSH: TSH: 1.87 u[IU]/mL (ref 0.35–4.50)

## 2019-03-21 LAB — VITAMIN B12: Vitamin B-12: 176 pg/mL — ABNORMAL LOW (ref 211–911)

## 2019-03-21 LAB — VITAMIN D 25 HYDROXY (VIT D DEFICIENCY, FRACTURES): VITD: 19.29 ng/mL — ABNORMAL LOW (ref 30.00–100.00)

## 2019-03-22 DIAGNOSIS — J3089 Other allergic rhinitis: Secondary | ICD-10-CM | POA: Diagnosis not present

## 2019-03-22 DIAGNOSIS — J301 Allergic rhinitis due to pollen: Secondary | ICD-10-CM | POA: Diagnosis not present

## 2019-03-25 ENCOUNTER — Encounter: Payer: Self-pay | Admitting: Internal Medicine

## 2019-03-25 ENCOUNTER — Ambulatory Visit: Admission: RE | Admit: 2019-03-25 | Payer: BC Managed Care – PPO | Source: Ambulatory Visit

## 2019-03-25 ENCOUNTER — Other Ambulatory Visit: Payer: Self-pay

## 2019-03-25 ENCOUNTER — Ambulatory Visit
Admission: RE | Admit: 2019-03-25 | Discharge: 2019-03-25 | Disposition: A | Discharge status: Home / Self Care | Payer: BC Managed Care – PPO | Source: Ambulatory Visit | Attending: Internal Medicine | Admitting: Internal Medicine

## 2019-03-25 DIAGNOSIS — R222 Localized swelling, mass and lump, trunk: Secondary | ICD-10-CM | POA: Insufficient documentation

## 2019-03-25 DIAGNOSIS — R2231 Localized swelling, mass and lump, right upper limb: Secondary | ICD-10-CM | POA: Diagnosis not present

## 2019-03-25 DIAGNOSIS — M79661 Pain in right lower leg: Secondary | ICD-10-CM | POA: Diagnosis not present

## 2019-03-25 DIAGNOSIS — R2230 Localized swelling, mass and lump, unspecified upper limb: Secondary | ICD-10-CM

## 2019-03-26 ENCOUNTER — Encounter: Payer: Self-pay | Admitting: Internal Medicine

## 2019-03-29 NOTE — Telephone Encounter (Signed)
I advised for her to try vit D qod but I am not aware of a shot for Vitamin D. I just wanted to confirm with you.

## 2019-03-29 NOTE — Telephone Encounter (Signed)
I recommend trying the vitamin D3 - qod.  Any problems, let us know.

## 2019-03-31 DIAGNOSIS — J301 Allergic rhinitis due to pollen: Secondary | ICD-10-CM | POA: Diagnosis not present

## 2019-03-31 DIAGNOSIS — J3089 Other allergic rhinitis: Secondary | ICD-10-CM | POA: Diagnosis not present

## 2019-04-05 DIAGNOSIS — J3089 Other allergic rhinitis: Secondary | ICD-10-CM | POA: Diagnosis not present

## 2019-04-05 DIAGNOSIS — J301 Allergic rhinitis due to pollen: Secondary | ICD-10-CM | POA: Diagnosis not present

## 2019-04-07 ENCOUNTER — Other Ambulatory Visit: Payer: Self-pay

## 2019-04-07 ENCOUNTER — Ambulatory Visit (INDEPENDENT_AMBULATORY_CARE_PROVIDER_SITE_OTHER): Payer: BC Managed Care – PPO

## 2019-04-07 DIAGNOSIS — E538 Deficiency of other specified B group vitamins: Secondary | ICD-10-CM | POA: Diagnosis not present

## 2019-04-07 MED ORDER — CYANOCOBALAMIN 1000 MCG/ML IJ SOLN
1000.0000 ug | Freq: Once | INTRAMUSCULAR | Status: AC
  Administered 2019-04-07: 11:00:00 1000 ug via INTRAMUSCULAR

## 2019-04-07 NOTE — Progress Notes (Addendum)
Patient came in today for first  B-12 injection in left deltoid IM. Patient tolerated well,   Reviewed.  Dr Nicki Reaper

## 2019-04-11 ENCOUNTER — Encounter: Payer: Self-pay | Admitting: Internal Medicine

## 2019-04-12 DIAGNOSIS — Z23 Encounter for immunization: Secondary | ICD-10-CM | POA: Diagnosis not present

## 2019-04-14 ENCOUNTER — Ambulatory Visit (INDEPENDENT_AMBULATORY_CARE_PROVIDER_SITE_OTHER): Payer: BC Managed Care – PPO

## 2019-04-14 ENCOUNTER — Other Ambulatory Visit: Payer: Self-pay

## 2019-04-14 DIAGNOSIS — E538 Deficiency of other specified B group vitamins: Secondary | ICD-10-CM | POA: Diagnosis not present

## 2019-04-14 MED ORDER — CYANOCOBALAMIN 1000 MCG/ML IJ SOLN
1000.0000 ug | Freq: Once | INTRAMUSCULAR | Status: AC
  Administered 2019-04-14: 1000 ug via INTRAMUSCULAR

## 2019-04-14 NOTE — Progress Notes (Addendum)
Shannon [name] presents today for injection per MD orders. B12 injection  administered IM in right Upper Arm. Administration without incident. Patient tolerated well. Rehmat Murtagh,cma   Reviewed.  Dr Nicki Reaper

## 2019-04-15 DIAGNOSIS — J301 Allergic rhinitis due to pollen: Secondary | ICD-10-CM | POA: Diagnosis not present

## 2019-04-15 DIAGNOSIS — R040 Epistaxis: Secondary | ICD-10-CM | POA: Diagnosis not present

## 2019-04-16 ENCOUNTER — Other Ambulatory Visit: Payer: Self-pay | Admitting: Internal Medicine

## 2019-04-17 NOTE — Telephone Encounter (Signed)
rx ok'd for next rx of xanax - #30 with one refill.

## 2019-04-19 ENCOUNTER — Telehealth (INDEPENDENT_AMBULATORY_CARE_PROVIDER_SITE_OTHER): Payer: BC Managed Care – PPO | Admitting: Internal Medicine

## 2019-04-19 DIAGNOSIS — J3089 Other allergic rhinitis: Secondary | ICD-10-CM | POA: Diagnosis not present

## 2019-04-19 DIAGNOSIS — L299 Pruritus, unspecified: Secondary | ICD-10-CM | POA: Diagnosis not present

## 2019-04-19 DIAGNOSIS — J301 Allergic rhinitis due to pollen: Secondary | ICD-10-CM | POA: Diagnosis not present

## 2019-04-19 DIAGNOSIS — F419 Anxiety disorder, unspecified: Secondary | ICD-10-CM | POA: Diagnosis not present

## 2019-04-19 NOTE — Telephone Encounter (Signed)
Pt was calling about itching all over. Please call back to advise. No appts available

## 2019-04-19 NOTE — Progress Notes (Signed)
Patient ID: Shannon [name], female   DOB: 17-Dec-1965, 54 y.o.   MRN: [id number]   Virtual Visit via video Note  This visit type was conducted due to national recommendations for restrictions regarding the COVID-19 pandemic (e.g. social distancing).  This format is felt to be most appropriate for this patient at this time.  All issues noted in this document were discussed and addressed.  No physical exam was performed (except for noted visual exam findings with Video Visits).   I connected with Shannon [name] by a video enabled telemedicine application and verified that I am speaking with the correct person using two identifiers. Location patient: home Location provider: work  Persons participating in the virtual visit: patient, provider  The limitations, risks, security and privacy concerns of performing an evaluation and management service by video and the availability of in person appointments have been discussed. The patient expressed understanding and agreed to proceed.   Reason for visit: work in appt  HPI: Work in appt for itching.  Received maderna vaccine last Tuesday.  Had seen ENT Friday before. No new medications.  Wednesday - noticed place on arm - same arm as shot.  After noticed increased itching all over.  Itching has been persistent.  No other rash.  No fever.  Has applied benadryl to the spot - on her arm.  Took 1/2 benadryl for a couple of days.  Eating.  No lip or tongue swelling.  No difficulty breathing.  Did get allergy shot this week.  Had not had allergy shot for two previous weeks.     ROS: See pertinent positives and negatives per HPI.  Past Medical History:  Diagnosis Date  . Anemia   . Anxiety   . Breast pain, right 11/07/2012  . Breathing difficulty 06/20/2014  . Environmental allergies   . GERD (gastroesophageal reflux disease)   . Hiatal hernia    small  . History of migraine headaches   . Hyperthyroidism    s/p ablation  . Vaginitis 01/16/2013    Past  Surgical History:  Procedure Laterality Date  . BREAST BIOPSY Right 12/05/2015   neg  . CESAREAN SECTION  11/98   Dr Roena Malady  . CHOLECYSTECTOMY  11/05   Dr Pat Patrick  . JOINT REPLACEMENT Left 2017   hip  . NASAL SINUS SURGERY  11/93  . SEPTOPLASTY  5/90   Dr Rossie Muskrat  . TUBAL LIGATION  02/1998  . UMBILICAL HERNIA REPAIR  02/1998    Family History  Problem Relation Age of Onset  . Heart disease Father   . Hypertension Father   . Thyroid disease Father   . Hypercholesterolemia Father   . Kidney cancer Father   . Leukemia Father        hairy cell  . Hypercholesterolemia Mother   . Thyroid disease Mother   . Breast cancer Mother 53  . Breast cancer Other        maternal great grandmother  . Heart disease Maternal Grandfather        MI - age 76  . Prostate cancer Paternal Grandfather   . Pancreatic cancer Paternal Grandfather   . Lung cancer Paternal Grandfather   . Bladder Cancer Neg Hx     SOCIAL HX: reviewed.    Current Outpatient Medications:  .  acetaminophen (TYLENOL) 500 MG tablet, Take 1,000 mg by mouth every 6 (six) hours as needed for moderate pain., Disp: , Rfl:  .  ALPRAZolam (XANAX) 0.25 MG tablet, TAKE ONE  TABLET EVERY DAY AS NEEDED FOR ANXIETY, Disp: 30 tablet, Rfl: 1 .  EPINEPHrine 0.3 mg/0.3 mL IJ SOAJ injection, , Disp: , Rfl:  .  famotidine (PEPCID) 40 MG tablet, Take by mouth., Disp: , Rfl:  .  omeprazole (PRILOSEC) 40 MG capsule, Take 40 mg by mouth daily., Disp: , Rfl:  .  SUMAtriptan (IMITREX) 100 MG tablet, , Disp: , Rfl:   EXAM:  GENERAL: alert, oriented, appears well and in no acute distress  HEENT: atraumatic, conjunttiva clear, no obvious abnormalities on inspection of external nose and ears  NECK: normal movements of the head and neck  LUNGS: on inspection no signs of respiratory distress, breathing rate appears normal, no obvious gross SOB, gasping or wheezing  CV: no obvious cyanosis  PSYCH/NEURO: pleasant and cooperative, no obvious  depression or anxiety, speech and thought processing grossly intact  ASSESSMENT AND PLAN:  Discussed the following assessment and plan:  Itching Persistent itching.  Unclear etiology.  Question if could be related to covid vaccine.  Allegra as directed.  F/u with allergist, with question if able to receive the second vaccine.  Follow.  Call with update.    Anxiety Takes xanax.  Stable.     I discussed the assessment and treatment plan with the patient. The patient was provided an opportunity to ask questions and all were answered. The patient agreed with the plan and demonstrated an understanding of the instructions.   The patient was advised to call back or seek an in-person evaluation if the symptoms worsen or if the condition fails to improve as anticipated.    [name], MD

## 2019-04-19 NOTE — Telephone Encounter (Signed)
Pt scheduled for today at 1:30

## 2019-04-21 ENCOUNTER — Encounter: Payer: Self-pay | Admitting: Internal Medicine

## 2019-04-21 ENCOUNTER — Ambulatory Visit (INDEPENDENT_AMBULATORY_CARE_PROVIDER_SITE_OTHER): Payer: BC Managed Care – PPO | Admitting: *Deleted

## 2019-04-21 ENCOUNTER — Other Ambulatory Visit: Payer: Self-pay

## 2019-04-21 DIAGNOSIS — E538 Deficiency of other specified B group vitamins: Secondary | ICD-10-CM

## 2019-04-21 DIAGNOSIS — J301 Allergic rhinitis due to pollen: Secondary | ICD-10-CM | POA: Diagnosis not present

## 2019-04-21 DIAGNOSIS — J3081 Allergic rhinitis due to animal (cat) (dog) hair and dander: Secondary | ICD-10-CM | POA: Diagnosis not present

## 2019-04-21 DIAGNOSIS — J3089 Other allergic rhinitis: Secondary | ICD-10-CM | POA: Diagnosis not present

## 2019-04-22 MED ORDER — CYANOCOBALAMIN 1000 MCG/ML IJ SOLN
1000.0000 ug | Freq: Once | INTRAMUSCULAR | Status: AC
  Administered 2019-04-21: 1000 ug via INTRAMUSCULAR

## 2019-04-22 NOTE — Progress Notes (Addendum)
Patient presented for B 12 injection to left deltoid, patient voiced no concerns nor showed any signs of distress during injection.  Reviewed.  Dr Scott 

## 2019-04-24 ENCOUNTER — Encounter: Payer: Self-pay | Admitting: Internal Medicine

## 2019-04-24 DIAGNOSIS — L299 Pruritus, unspecified: Secondary | ICD-10-CM | POA: Insufficient documentation

## 2019-04-24 NOTE — Assessment & Plan Note (Signed)
Persistent itching.  Unclear etiology.  Question if could be related to covid vaccine.  Allegra as directed.  F/u with allergist, with question if able to receive the second vaccine.  Follow.  Call with update.

## 2019-04-24 NOTE — Assessment & Plan Note (Signed)
Takes xanax.  Stable.

## 2019-04-26 DIAGNOSIS — J301 Allergic rhinitis due to pollen: Secondary | ICD-10-CM | POA: Diagnosis not present

## 2019-04-26 DIAGNOSIS — J3089 Other allergic rhinitis: Secondary | ICD-10-CM | POA: Diagnosis not present

## 2019-04-27 NOTE — Telephone Encounter (Signed)
Please call pt and confirm no other symptoms other than itching.  Confirm no rash.  She sees Dr Donneta Romberg.  I do not mind seeing her and prescribing what is needed, but given this has been ongoing, I would like for her to call Dr Donneta Romberg and see what he suggest.  Also, please notifiy her to let him know itching originally started after first covid vaccine.  (question of taking next covid vaccine).

## 2019-04-27 NOTE — Telephone Encounter (Signed)
Patient aware and is going to call Dr Donneta Romberg and let me know if she needs anything

## 2019-04-28 ENCOUNTER — Other Ambulatory Visit: Payer: Self-pay

## 2019-04-28 ENCOUNTER — Ambulatory Visit (INDEPENDENT_AMBULATORY_CARE_PROVIDER_SITE_OTHER): Payer: BC Managed Care – PPO

## 2019-04-28 DIAGNOSIS — J301 Allergic rhinitis due to pollen: Secondary | ICD-10-CM | POA: Diagnosis not present

## 2019-04-28 DIAGNOSIS — R21 Rash and other nonspecific skin eruption: Secondary | ICD-10-CM | POA: Diagnosis not present

## 2019-04-28 DIAGNOSIS — E538 Deficiency of other specified B group vitamins: Secondary | ICD-10-CM | POA: Diagnosis not present

## 2019-04-28 DIAGNOSIS — H1045 Other chronic allergic conjunctivitis: Secondary | ICD-10-CM | POA: Diagnosis not present

## 2019-04-28 DIAGNOSIS — R05 Cough: Secondary | ICD-10-CM | POA: Diagnosis not present

## 2019-04-28 DIAGNOSIS — J3089 Other allergic rhinitis: Secondary | ICD-10-CM | POA: Diagnosis not present

## 2019-04-28 MED ORDER — CYANOCOBALAMIN 1000 MCG/ML IJ SOLN
1000.0000 ug | Freq: Once | INTRAMUSCULAR | Status: AC
  Administered 2019-04-28: 1000 ug via INTRAMUSCULAR

## 2019-04-28 NOTE — Progress Notes (Addendum)
Patient presented for B 12 injection to right deltoid, patient voiced no concerns nor showed any signs of distress during injection.  Reviewed.  Dr Scott 

## 2019-05-03 DIAGNOSIS — J301 Allergic rhinitis due to pollen: Secondary | ICD-10-CM | POA: Diagnosis not present

## 2019-05-03 DIAGNOSIS — J3089 Other allergic rhinitis: Secondary | ICD-10-CM | POA: Diagnosis not present

## 2019-05-10 DIAGNOSIS — J3089 Other allergic rhinitis: Secondary | ICD-10-CM | POA: Diagnosis not present

## 2019-05-10 DIAGNOSIS — J301 Allergic rhinitis due to pollen: Secondary | ICD-10-CM | POA: Diagnosis not present

## 2019-05-14 ENCOUNTER — Encounter: Payer: Self-pay | Admitting: Internal Medicine

## 2019-05-16 DIAGNOSIS — Z23 Encounter for immunization: Secondary | ICD-10-CM | POA: Diagnosis not present

## 2019-05-24 DIAGNOSIS — J3089 Other allergic rhinitis: Secondary | ICD-10-CM | POA: Diagnosis not present

## 2019-05-24 DIAGNOSIS — J301 Allergic rhinitis due to pollen: Secondary | ICD-10-CM | POA: Diagnosis not present

## 2019-05-26 ENCOUNTER — Other Ambulatory Visit: Payer: Self-pay

## 2019-05-26 ENCOUNTER — Ambulatory Visit (INDEPENDENT_AMBULATORY_CARE_PROVIDER_SITE_OTHER): Payer: BC Managed Care – PPO

## 2019-05-26 DIAGNOSIS — E538 Deficiency of other specified B group vitamins: Secondary | ICD-10-CM | POA: Diagnosis not present

## 2019-05-26 MED ORDER — CYANOCOBALAMIN 1000 MCG/ML IJ SOLN
1000.0000 ug | Freq: Once | INTRAMUSCULAR | Status: AC
  Administered 2019-05-26: 1000 ug via INTRAMUSCULAR

## 2019-05-26 NOTE — Progress Notes (Addendum)
Patient presented for B 12 injection to left deltoid, patient voiced no concerns nor showed any signs of distress during injection.  Reviewed.  Dr Scott 

## 2019-05-31 DIAGNOSIS — J301 Allergic rhinitis due to pollen: Secondary | ICD-10-CM | POA: Diagnosis not present

## 2019-05-31 DIAGNOSIS — J3089 Other allergic rhinitis: Secondary | ICD-10-CM | POA: Diagnosis not present

## 2019-06-04 IMAGING — CT CT RENAL STONE PROTOCOL
2 of 4 series · 16 of 46 positions shown, 18 images · non-contrast
Comparison: None.

CLINICAL DATA: Bilateral flank pain for several weeks.

EXAM:
CT ABDOMEN AND PELVIS WITHOUT CONTRAST
TECHNIQUE: Multidetector CT imaging of the abdomen and pelvis was performed
following the standard protocol without IV contrast.

[Series 2: renal (person_name) · axial · 0.61mm/px · z∈[-1632,-1247]mm · 13 of 85 slices shown, 15 images (1 of 2)]
[im 4/85  soft-tissue]
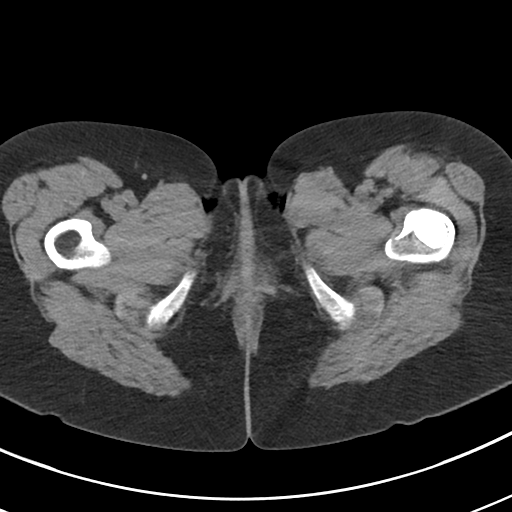
[im 4/85  bone]
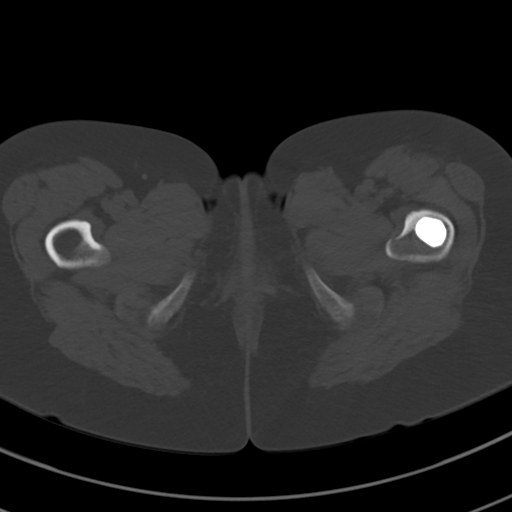
[im 11/85  soft-tissue]
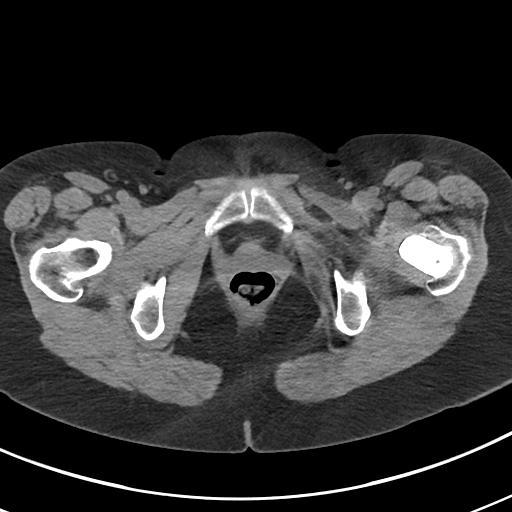
[im 18/85  soft-tissue]
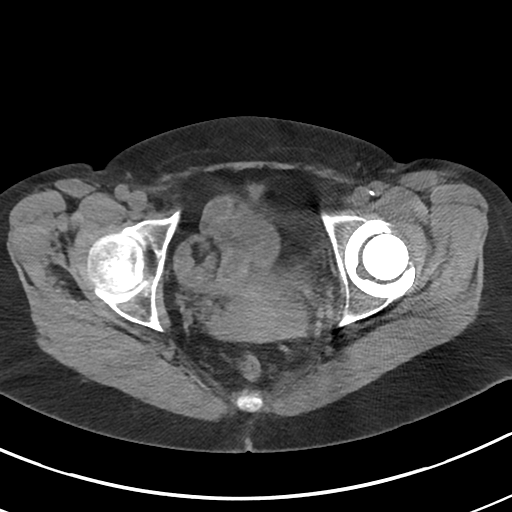
[im 25/85  soft-tissue]
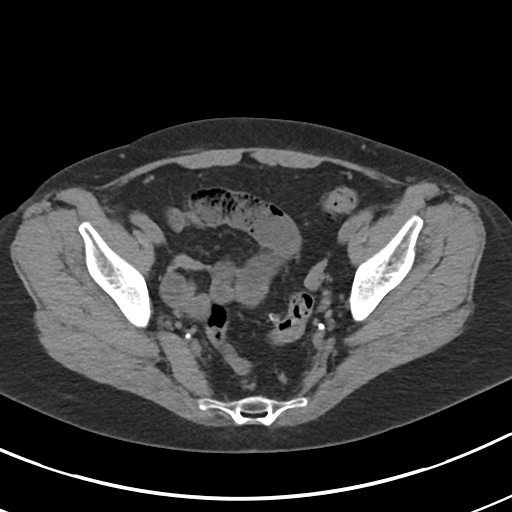
[im 29/85  soft-tissue]
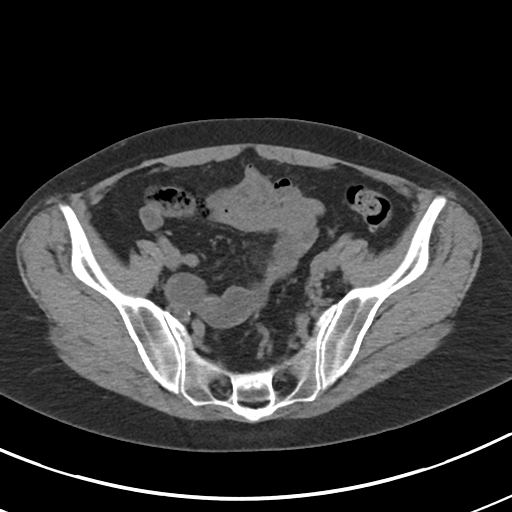
[im 36/85  soft-tissue]
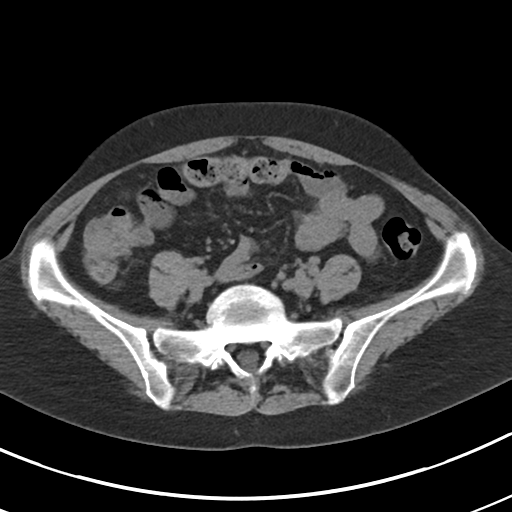
[im 43/85  soft-tissue]
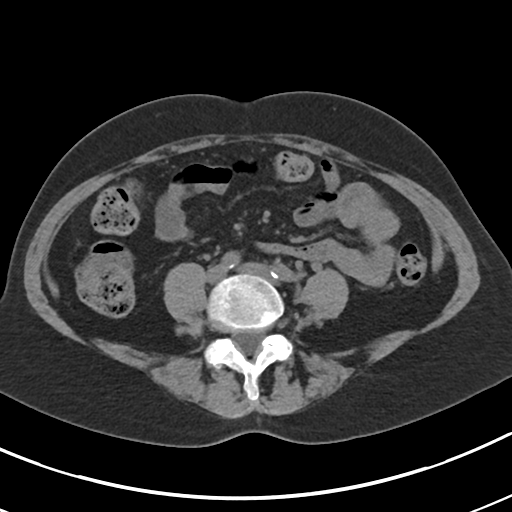
[im 50/85  soft-tissue]
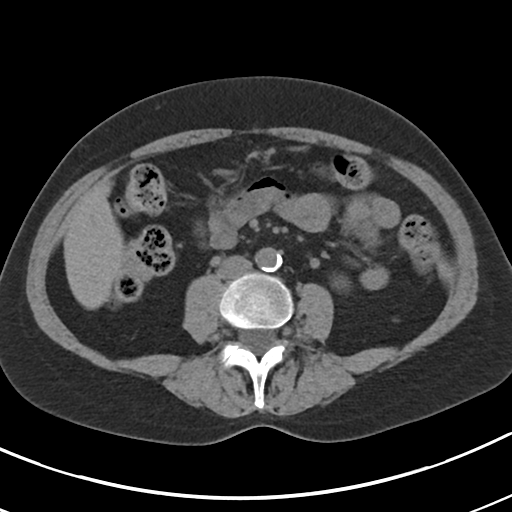
[im 57/85  soft-tissue]
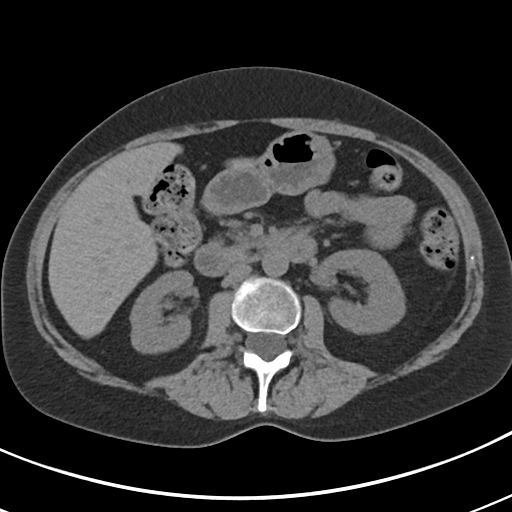
[im 57/85  bone]
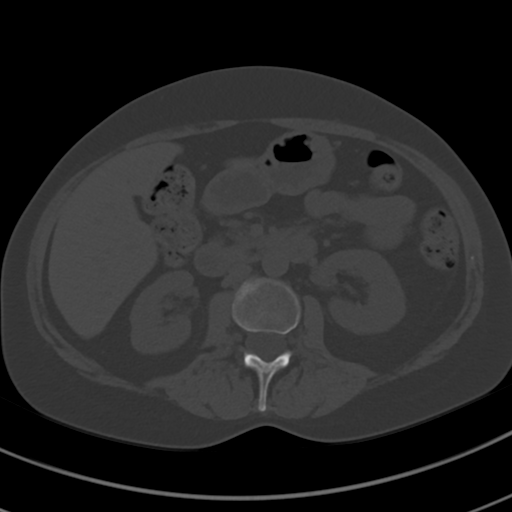
[im 60/85  soft-tissue]
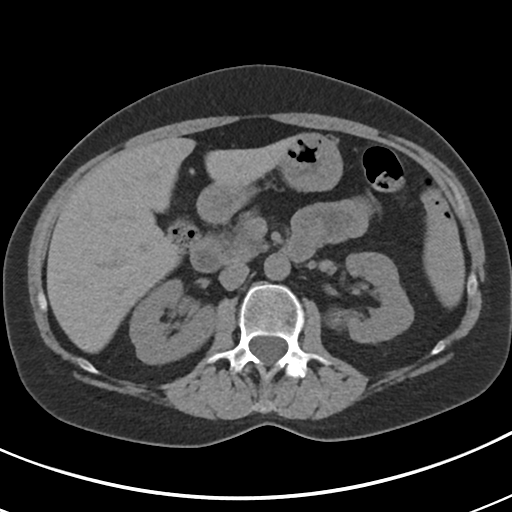
[im 67/85  soft-tissue]
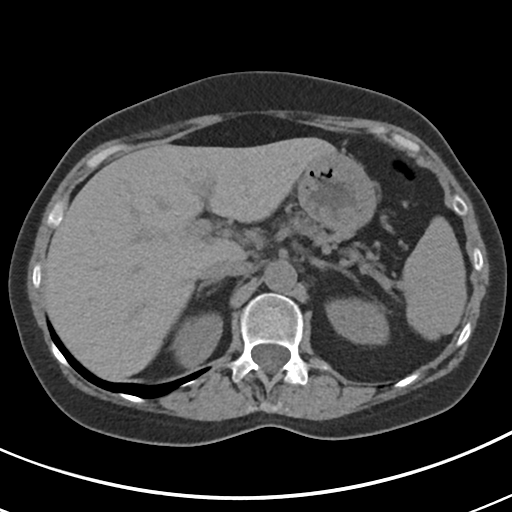
[im 74/85  soft-tissue]
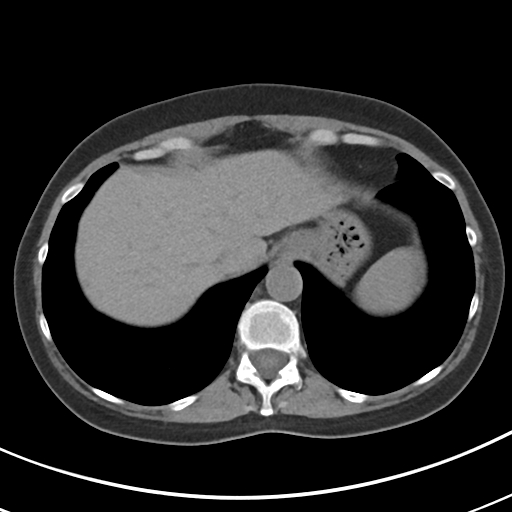
[im 81/85  soft-tissue]
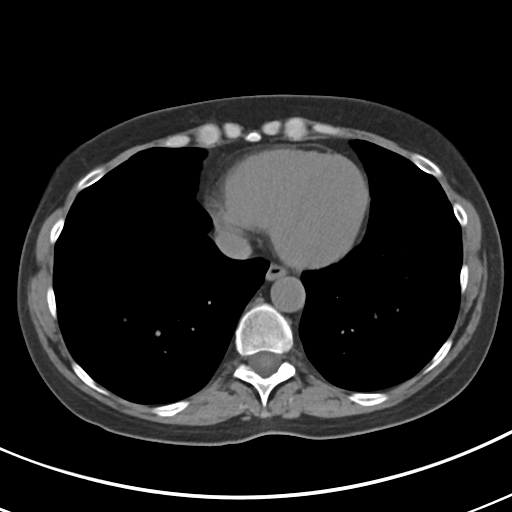

[Series 4: renal (person_name) · coronal · 0.61mm/px · 3 of 120 slices shown (2 of 2)]
[im 40/120  soft-tissue]
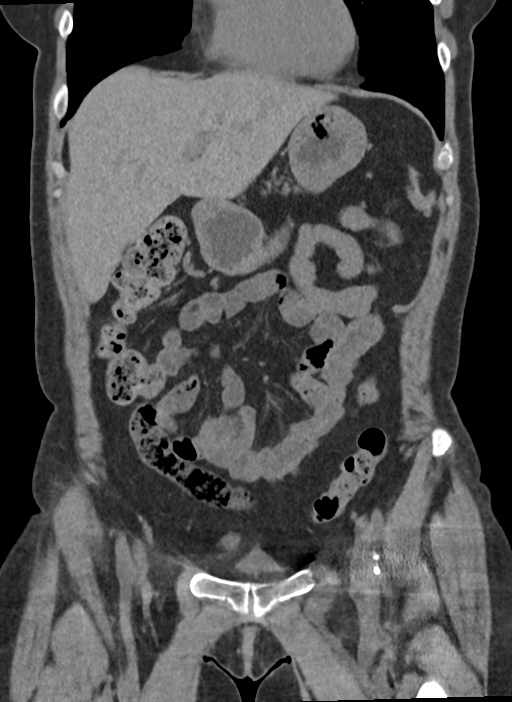
[im 53/120  soft-tissue]
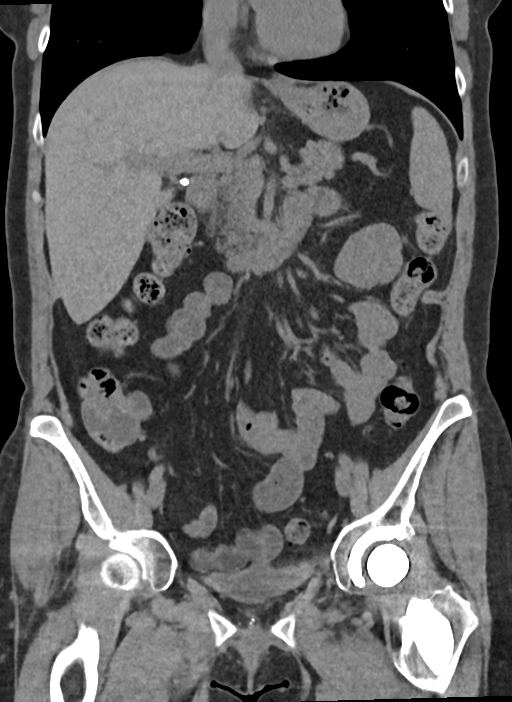
[im 67/120  soft-tissue]
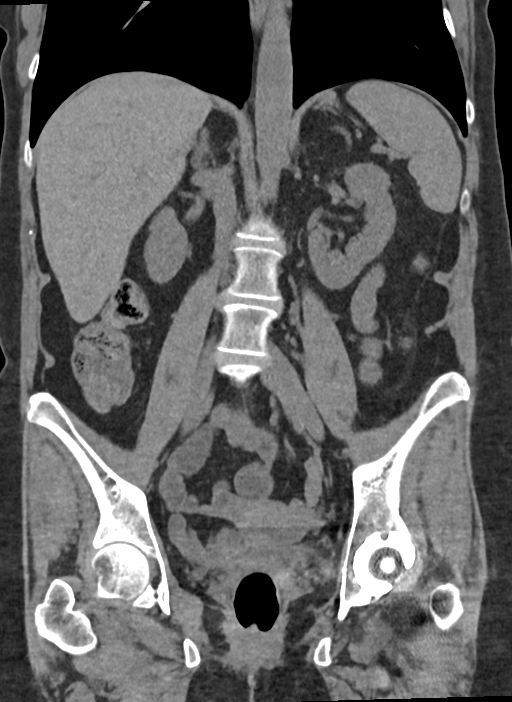

[16 of 46 positions shown; findings below may reference images not displayed]

FINDINGS: Lower chest: No acute findings.

Hepatobiliary: No mass visualized on this unenhanced exam. Prior
cholecystectomy. No evidence of biliary obstruction.

Pancreas: No mass or inflammatory process visualized on this
unenhanced exam.

Spleen:  Within normal limits in size.

Adrenals/Urinary tract: No evidence of urolithiasis or
hydronephrosis. Unremarkable unopacified urinary bladder.

Stomach/Bowel: No evidence of obstruction, inflammatory process, or
abnormal fluid collections.

Vascular/Lymphatic: No pathologically enlarged lymph nodes
identified. No evidence of abdominal aortic aneurysm. Aortic
atherosclerosis.

Reproductive: 2.3 cm calcified posterior uterine fibroid. Adnexal
regions are otherwise unremarkable.

Other:  None.

Musculoskeletal:  No suspicious bone lesions identified.
IMPRESSION: No evidence of urolithiasis, hydronephrosis, or other acute
findings.

Small calcified posterior uterine fibroid.

## 2019-06-07 DIAGNOSIS — J301 Allergic rhinitis due to pollen: Secondary | ICD-10-CM | POA: Diagnosis not present

## 2019-06-07 DIAGNOSIS — J3089 Other allergic rhinitis: Secondary | ICD-10-CM | POA: Diagnosis not present

## 2019-06-14 DIAGNOSIS — J301 Allergic rhinitis due to pollen: Secondary | ICD-10-CM | POA: Diagnosis not present

## 2019-06-14 DIAGNOSIS — J3089 Other allergic rhinitis: Secondary | ICD-10-CM | POA: Diagnosis not present

## 2019-06-16 ENCOUNTER — Other Ambulatory Visit: Payer: Self-pay | Admitting: Internal Medicine

## 2019-06-16 NOTE — Telephone Encounter (Signed)
rx ok'd for xanax #30 with one refill  

## 2019-06-16 NOTE — Telephone Encounter (Signed)
Refill request for xanax, last seen 04-19-19, last filled 04-17-19.  Please advise.

## 2019-06-21 DIAGNOSIS — J301 Allergic rhinitis due to pollen: Secondary | ICD-10-CM | POA: Diagnosis not present

## 2019-06-21 DIAGNOSIS — J3089 Other allergic rhinitis: Secondary | ICD-10-CM | POA: Diagnosis not present

## 2019-06-22 ENCOUNTER — Encounter: Payer: Self-pay | Admitting: Internal Medicine

## 2019-06-23 NOTE — Telephone Encounter (Signed)
LM that I had scheduled patient for Tuesday at 3p. I asked that she call back or mychart [place] if that does not work for her. I will also reply to her mychart as well.

## 2019-06-23 NOTE — Telephone Encounter (Signed)
Yes I can work her in next week.  Per her note, she is out of town until Sunday

## 2019-06-28 ENCOUNTER — Telehealth: Payer: Self-pay | Admitting: Internal Medicine

## 2019-06-28 ENCOUNTER — Ambulatory Visit: Payer: BC Managed Care – PPO | Admitting: Internal Medicine

## 2019-06-28 ENCOUNTER — Encounter: Payer: Self-pay | Admitting: Internal Medicine

## 2019-06-28 ENCOUNTER — Other Ambulatory Visit: Payer: Self-pay

## 2019-06-28 VITALS — BP 126/70 | HR 77 | Temp 97.5°F | Resp 16 | Wt 124.0 lb

## 2019-06-28 DIAGNOSIS — J3089 Other allergic rhinitis: Secondary | ICD-10-CM | POA: Diagnosis not present

## 2019-06-28 DIAGNOSIS — J301 Allergic rhinitis due to pollen: Secondary | ICD-10-CM | POA: Diagnosis not present

## 2019-06-28 DIAGNOSIS — N644 Mastodynia: Secondary | ICD-10-CM

## 2019-06-28 DIAGNOSIS — F419 Anxiety disorder, unspecified: Secondary | ICD-10-CM

## 2019-06-28 MED ORDER — ONDANSETRON 4 MG PO TBDP: 4.0000 mg | ORAL_TABLET | Freq: Two times a day (BID) | ORAL | 0 refills | Status: DC | PRN

## 2019-06-28 NOTE — Progress Notes (Signed)
Patient ID: Shannon [name], female   DOB: 11/09/1965, 54 y.o.   MRN: [id number]   Subjective:    Patient ID: Shannon [name], female    DOB: 1965/10/16, 54 y.o.   MRN: [id number]  HPI This visit occurred during the SARS-CoV-2 public health emergency.  Safety protocols were in place, including screening questions prior to the visit, additional usage of staff PPE, and extensive cleaning of exam room while observing appropriate contact time as indicated for disinfecting solutions.  Patient here for a work in appt with concerns regarding right breast pain.  Sent message in with concern about a possible knot in breast.  Noticed last week - 06/22/19 - nipple hurt.  Felt like had a know beneath the nipple.  Stinging sensation.  No nipple discharge.  No redness.  Soreness is better.  Day before she noticed the symptoms, she wore a t-shirt without a bra.  No other injury or trauma.  Concerned given her family history of cancer.  Noticed recently to have right axillary fullness.  Ultrasound 03/28/19 - I.    Past Medical History:  Diagnosis Date  . Anemia   . Anxiety   . Breast pain, right 11/07/2012  . Breathing difficulty 06/20/2014  . Environmental allergies   . GERD (gastroesophageal reflux disease)   . Hiatal hernia    small  . History of migraine headaches   . Hyperthyroidism    s/p ablation  . Vaginitis 01/16/2013   Past Surgical History:  Procedure Laterality Date  . BREAST BIOPSY Right 12/05/2015   neg  . CESAREAN SECTION  11/98   Dr Roena Malady  . CHOLECYSTECTOMY  11/05   Dr Pat Patrick  . JOINT REPLACEMENT Left 2017   hip  . NASAL SINUS SURGERY  11/93  . SEPTOPLASTY  5/90   Dr Rossie Muskrat  . TUBAL LIGATION  02/1998  . UMBILICAL HERNIA REPAIR  02/1998   Family History  Problem Relation Age of Onset  . Heart disease Father   . Hypertension Father   . Thyroid disease Father   . Hypercholesterolemia Father   . Kidney cancer Father   . Leukemia Father        hairy cell  . Hypercholesterolemia  Mother   . Thyroid disease Mother   . Breast cancer Mother 81  . Breast cancer Other        maternal great grandmother  . Heart disease Maternal Grandfather        MI - age 61  . Prostate cancer Paternal Grandfather   . Pancreatic cancer Paternal Grandfather   . Lung cancer Paternal Grandfather   . Bladder Cancer Neg Hx    Social History   Socioeconomic History  . Marital status: Married    Spouse name: Not on file  . Number of children: 1  . Years of education: Not on file  . Highest education level: Not on file  Occupational History  . Not on file  Tobacco Use  . Smoking status: Former Smoker    Quit date: 01/28/2000    Years since quitting: 19.4  . Smokeless tobacco: Never Used  Substance and Sexual Activity  . Alcohol use: No    Alcohol/week: 0.0 standard drinks  . Drug use: No  . Sexual activity: Not on file  Other Topics Concern  . Not on file  Social History Narrative  . Not on file   Social Determinants of Health   Financial Resource Strain:   . Difficulty of Paying Living Expenses:  Food Insecurity:   . Worried About Charity fundraiser in the Last Year:   . Arboriculturist in the Last Year:   Transportation Needs:   . Film/video editor (Medical):   Marland Kitchen Lack of Transportation (Non-Medical):   Physical Activity:   . Days of Exercise per Week:   . Minutes of Exercise per Session:   Stress:   . Feeling of Stress :   Social Connections:   . Frequency of Communication with Friends and Family:   . Frequency of Social Gatherings with Friends and Family:   . Attends Religious Services:   . Active Member of Clubs or Organizations:   . Attends Archivist Meetings:   Marland Kitchen Marital Status:     Outpatient Encounter Medications as of 06/28/2019  Medication Sig  . acetaminophen (TYLENOL) 500 MG tablet Take 1,000 mg by mouth every 6 (six) hours as needed for moderate pain.  Marland Kitchen ALPRAZolam (XANAX) 0.25 MG tablet TAKE ONE TABLET EVERY DAY AS NEEDED FOR  ANXIETY  . EPINEPHrine 0.3 mg/0.3 mL IJ SOAJ injection   . famotidine (PEPCID) 40 MG tablet Take by mouth.  Marland Kitchen omeprazole (PRILOSEC) 40 MG capsule Take 40 mg by mouth daily.  . ondansetron (ZOFRAN ODT) 4 MG disintegrating tablet Take 1 tablet (4 mg total) by mouth 2 (two) times daily as needed for nausea or vomiting.  . SUMAtriptan (IMITREX) 100 MG tablet    No facility-administered encounter medications on file as of 06/28/2019.    Review of Systems  Constitutional: Negative for appetite change and unexpected weight change.  Respiratory: Negative for cough, chest tightness and shortness of breath.   Cardiovascular: Negative for chest pain and leg swelling.  Gastrointestinal: Negative for diarrhea, nausea and vomiting.  Musculoskeletal: Negative for joint swelling and myalgias.  Skin: Negative for color change and rash.  Psychiatric/Behavioral: Negative for agitation and dysphoric mood.       Objective:    Physical Exam Constitutional:      General: She is not in acute distress.    Appearance: Normal appearance.  HENT:     Head: Normocephalic and atraumatic.     Right Ear: External ear normal.     Left Ear: External ear normal.  Neck:     Thyroid: No thyromegaly.  Cardiovascular:     Rate and Rhythm: Normal rate and regular rhythm.  Pulmonary:     Effort: No respiratory distress.     Breath sounds: Normal breath sounds. No wheezing.     Comments: Breasts:  No nipple discharge present.  Minimal tenderness and questionable fullness right nipple.  No other nodules.  No axillary adenopathy.  Left breast - no nodules or palpable adenopathy.   Abdominal:     General: Bowel sounds are normal.     Palpations: Abdomen is soft.     Tenderness: There is no abdominal tenderness.  Musculoskeletal:     Cervical back: Neck supple. No tenderness.  Lymphadenopathy:     Cervical: No cervical adenopathy.  Skin:    Findings: No erythema or rash.  Neurological:     Mental Status: She is  alert.  Psychiatric:        Mood and Affect: Mood normal.        Behavior: Behavior normal.     BP 126/70   Pulse 77   Temp (!) 97.5 F (36.4 C)   Resp 16   Wt 124 lb (56.2 kg)   LMP 02/12/2013   SpO2 99%  BMI 22.68 kg/m  Wt Readings from Last 3 Encounters:  06/28/19 124 lb (56.2 kg)  04/19/19 125 lb (56.7 kg)  03/10/19 125 lb 6.4 oz (56.9 kg)     Lab Results  Component Value Date   WBC 5.1 03/21/2019   HGB 13.5 03/21/2019   HCT 41.0 03/21/2019   PLT 242.0 03/21/2019   GLUCOSE 91 03/21/2019   CHOL 240 (H) 03/21/2019   TRIG 132.0 03/21/2019   HDL 90.00 03/21/2019   LDLCALC 123 (H) 03/21/2019   ALT 16 03/21/2019   AST 16 03/21/2019   NA 140 03/21/2019   K 3.7 03/21/2019   CL 103 03/21/2019   CREATININE 0.73 03/21/2019   BUN 10 03/21/2019   CO2 27 03/21/2019   TSH 1.87 03/21/2019    [place] RT LOWER EXTREM LTD SOFT TISSUE NON VASCULAR  Result Date: 03/25/2019 CLINICAL DATA:  Right calf pain. EXAM: ULTRASOUND RIGHT LOWER EXTREMITY LIMITED TECHNIQUE: Ultrasound examination of the lower extremity soft tissues was performed in the area of clinical concern. COMPARISON:  None. FINDINGS: In the medial right calf area there is a intramuscular nonvascular 1.6 x 0.4 by 1.7 cm hypoechoic structure. This appears to be centered within the right gastrocnemius muscle. This finding is elongated in the SI direction. There is no overlying skin thickening. IMPRESSION: There is a small 1.7 cm hypoechoic collection that appears to be centered within the right gastrocnemius muscle. This is favored to represent a small intramuscular hematoma or muscle strain. If symptoms persist, consider a contrast enhanced MRI for further evaluation. Electronically Signed   By: Constance Holster M.D.   On: 03/25/2019 22:45   [place] AXILLA RIGHT  Result Date: 03/28/2019 CLINICAL DATA:  Axillary fullness EXAM: ULTRASOUND OF THE right AXILLA COMPARISON:  None FINDINGS: Examination presented for dictation 03/28/2019.  Targeted sonography of the right mid axilla performed in the region of tenderness and fullness. No cysts or solid mass is visualized. No abnormally enlarged lymph node. IMPRESSION: Negative. No sonographic correlate for right axillary fullness. Further management will need to be based on clinical grounds. RECOMMENDATION: Further management of right axillary fullness will need to be based on clinical grounds. BI-RADS CATEGORY  1: Negative. Electronically Signed   By: Donavan Foil M.D.   On: 03/28/2019 21:04       Assessment & Plan:   Problem List Items Addressed This Visit    Anxiety    Handling stress. Takes xanax.  Stable.        Nipple tenderness - Primary    Symptoms and exam as outlined.  Given fullness and pain and family history, will obtain right breast mammogram with possible ultrasound.  Further w/up pending results.  Pt comfortable with this plan.        Relevant Orders   MM DIAG BREAST TOMO UNI RIGHT (Completed)   US BREAST LTD UNI RIGHT INC AXILLA (Completed)       [name], MD

## 2019-06-28 NOTE — Telephone Encounter (Signed)
Left pt vm to call ofc regarding mammo appt at [facility] on 07/17/2019 @ 2:20pm

## 2019-06-29 ENCOUNTER — Ambulatory Visit
Admission: RE | Admit: 2019-06-29 | Discharge: 2019-06-29 | Disposition: A | Discharge status: Home / Self Care | Payer: BC Managed Care – PPO | Source: Ambulatory Visit | Attending: Internal Medicine | Admitting: Internal Medicine

## 2019-06-29 DIAGNOSIS — R922 Inconclusive mammogram: Secondary | ICD-10-CM | POA: Diagnosis not present

## 2019-06-29 DIAGNOSIS — N644 Mastodynia: Secondary | ICD-10-CM

## 2019-06-30 ENCOUNTER — Ambulatory Visit (INDEPENDENT_AMBULATORY_CARE_PROVIDER_SITE_OTHER): Payer: BC Managed Care – PPO

## 2019-06-30 ENCOUNTER — Other Ambulatory Visit: Payer: Self-pay

## 2019-06-30 DIAGNOSIS — E538 Deficiency of other specified B group vitamins: Secondary | ICD-10-CM

## 2019-06-30 MED ORDER — CYANOCOBALAMIN 1000 MCG/ML IJ SOLN
1000.0000 ug | Freq: Once | INTRAMUSCULAR | Status: AC
  Administered 2019-06-30: 1000 ug via INTRAMUSCULAR

## 2019-06-30 NOTE — Progress Notes (Addendum)
Patient presented for B 12 injection to left deltoid, patient voiced no concerns nor showed any signs of distress during injection.  Reviewed.  Dr Scott 

## 2019-07-01 DIAGNOSIS — J301 Allergic rhinitis due to pollen: Secondary | ICD-10-CM | POA: Diagnosis not present

## 2019-07-03 ENCOUNTER — Encounter: Payer: Self-pay | Admitting: Internal Medicine

## 2019-07-03 DIAGNOSIS — N644 Mastodynia: Secondary | ICD-10-CM | POA: Insufficient documentation

## 2019-07-03 NOTE — Assessment & Plan Note (Signed)
Symptoms and exam as outlined.  Given fullness and pain and family history, will obtain right breast mammogram with possible ultrasound.  Further w/up pending results.  Pt comfortable with this plan.

## 2019-07-03 NOTE — Assessment & Plan Note (Signed)
Handling stress. Takes xanax.  Stable.

## 2019-07-04 DIAGNOSIS — J3089 Other allergic rhinitis: Secondary | ICD-10-CM | POA: Diagnosis not present

## 2019-07-05 DIAGNOSIS — J3089 Other allergic rhinitis: Secondary | ICD-10-CM | POA: Diagnosis not present

## 2019-07-05 DIAGNOSIS — J301 Allergic rhinitis due to pollen: Secondary | ICD-10-CM | POA: Diagnosis not present

## 2019-07-12 DIAGNOSIS — J301 Allergic rhinitis due to pollen: Secondary | ICD-10-CM | POA: Diagnosis not present

## 2019-07-12 DIAGNOSIS — J3089 Other allergic rhinitis: Secondary | ICD-10-CM | POA: Diagnosis not present

## 2019-07-21 DIAGNOSIS — J301 Allergic rhinitis due to pollen: Secondary | ICD-10-CM | POA: Diagnosis not present

## 2019-07-21 DIAGNOSIS — J3089 Other allergic rhinitis: Secondary | ICD-10-CM | POA: Diagnosis not present

## 2019-07-26 DIAGNOSIS — J3089 Other allergic rhinitis: Secondary | ICD-10-CM | POA: Diagnosis not present

## 2019-07-26 DIAGNOSIS — J301 Allergic rhinitis due to pollen: Secondary | ICD-10-CM | POA: Diagnosis not present

## 2019-08-02 ENCOUNTER — Ambulatory Visit (INDEPENDENT_AMBULATORY_CARE_PROVIDER_SITE_OTHER): Payer: BC Managed Care – PPO

## 2019-08-02 ENCOUNTER — Other Ambulatory Visit: Payer: Self-pay

## 2019-08-02 DIAGNOSIS — J301 Allergic rhinitis due to pollen: Secondary | ICD-10-CM | POA: Diagnosis not present

## 2019-08-02 DIAGNOSIS — E538 Deficiency of other specified B group vitamins: Secondary | ICD-10-CM

## 2019-08-02 DIAGNOSIS — J3089 Other allergic rhinitis: Secondary | ICD-10-CM | POA: Diagnosis not present

## 2019-08-02 MED ORDER — CYANOCOBALAMIN 1000 MCG/ML IJ SOLN
1000.0000 ug | Freq: Once | INTRAMUSCULAR | Status: AC
  Administered 2019-08-02: 1000 ug via INTRAMUSCULAR

## 2019-08-02 NOTE — Progress Notes (Addendum)
Patient presented for B 12 injection to left deltoid, patient voiced no concerns nor showed any signs of distress during injection.  Reviewed.  Dr Scott 

## 2019-08-09 DIAGNOSIS — J301 Allergic rhinitis due to pollen: Secondary | ICD-10-CM | POA: Diagnosis not present

## 2019-08-09 DIAGNOSIS — J3081 Allergic rhinitis due to animal (cat) (dog) hair and dander: Secondary | ICD-10-CM | POA: Diagnosis not present

## 2019-08-09 DIAGNOSIS — J3089 Other allergic rhinitis: Secondary | ICD-10-CM | POA: Diagnosis not present

## 2019-08-15 ENCOUNTER — Other Ambulatory Visit: Payer: Self-pay | Admitting: Internal Medicine

## 2019-08-16 DIAGNOSIS — J301 Allergic rhinitis due to pollen: Secondary | ICD-10-CM | POA: Diagnosis not present

## 2019-08-16 DIAGNOSIS — D2262 Melanocytic nevi of left upper limb, including shoulder: Secondary | ICD-10-CM | POA: Diagnosis not present

## 2019-08-16 DIAGNOSIS — D2271 Melanocytic nevi of right lower limb, including hip: Secondary | ICD-10-CM | POA: Diagnosis not present

## 2019-08-16 DIAGNOSIS — J3089 Other allergic rhinitis: Secondary | ICD-10-CM | POA: Diagnosis not present

## 2019-08-16 DIAGNOSIS — D2261 Melanocytic nevi of right upper limb, including shoulder: Secondary | ICD-10-CM | POA: Diagnosis not present

## 2019-08-16 DIAGNOSIS — D225 Melanocytic nevi of trunk: Secondary | ICD-10-CM | POA: Diagnosis not present

## 2019-08-16 NOTE — Telephone Encounter (Signed)
rx ok'd for alprazolam #30 with one refill.

## 2019-08-21 NOTE — Progress Notes (Signed)
PROVIDER NOTE: Information contained herein reflects review and annotations entered in association with encounter. Interpretation of such information and data should be left to medically-trained personnel. Information provided to patient can be located elsewhere in the medical record under "Patient Instructions". Document created using STT-dictation technology, any transcriptional errors that may result from process are unintentional.    Patient: Shannon [name]  Service Category: E/M  Provider: Gaspar Cola, MD  DOB: Jan 24, 1966  DOS: 08/22/2019  Specialty: Interventional Pain Management  MRN: [id number]  Setting: Ambulatory outpatient  PCP: Shannon Pheasant, MD  Type: Established Patient    Referring Provider: [name], MD  Location: Office  Delivery: Face-to-face     HPI  Reason for encounter: [name], a 54 y.o. year old female, is here today for evaluation and management of her Chronic pain syndrome [G89.4]. [name] primary complain today is Neck Pain (left) and Back Pain Last encounter: Practice (Visit date not found). My last encounter with her was on Visit date not found. Pertinent problems: [name] has Migraine; Chronic low back pain (Primary Area of Pain) (Bilateral) (R>L); Lumbar lateral recess stenosis (L4-5) (Left); Lumbar facet hypertrophy; Chronic lumbar radicular pain (intermittent) (L4 Dermatome) (Left); Chronic hip pain (resolved after hip replacement) (Left); Lumbar spondylosis; Osteoarthritis of sacroiliac joint (Bilateral) (R>L); History of hip replacement (Left); Osteoarthritis of hip (Left); Chronic sacroiliac joint pain (Bilateral) (R>L); Chronic myofascial pain syndrome (Right Gluteous Muscle); Chronic pain syndrome; Other specified dorsopathies, sacral and sacrococcygeal region; Lumbar Facet syndrome (Bilateral) (R>L); Spondylosis without myelopathy or radiculopathy, lumbosacral region; Chronic toe pain (Right); Great toe pain (Right); Osteoarthritis of  great toe joint (Right); DDD (degenerative disc disease), lumbosacral; Cervicalgia; Cervical myofascial pain syndrome (Left); and Occipital neuralgia (Left) on their pertinent problem list. Pain Assessment: Severity of Chronic pain is reported as a 3 /10. Location: Neck Left/radiates into left top of shoulder. Onset: More than a month ago. Quality: Sharp, Discomfort. Timing: Intermittent (when moving it). Modifying factor(s): biofreeze, ice. Vitals:  height is '5\' 2"'  (1.575 m) and weight is 124 lb (56.2 kg). Her blood pressure is 122/80 and her pulse is 89. Her respiration is 16 and oxygen saturation is 100%.   The patient comes into the clinic today indicating a new area of pain in the back of the head on the left side.  She seems to be having a trigger point at the insertion of the splenius capitis muscle on the left side.  She indicates that it refers pain towards the shoulder area, more so the trapezius since it does not get all the way to the shoulder joint.  She would like to have a trigger point injection done today.  She indicates that she will occasionally experience some headaches on that side of the head.  She indicates also beginning to experience some pain in the lower back, but she would like to have the neck area treated first.  She feels that she may need an SI joint injection soon.  Procedure:          Anesthesia, Analgesia, Anxiolysis:  Type: Trigger Point Injection           Level: Cervical/occipital Laterality: Left Position: Sitting Target Muscle: Splenius capitis muscle Approach: Percutaneous Region: Posterolateral  Cervical area. Primary Purpose: Diagnostic  Type: Local Anesthesia Indication(s): Analgesia         Local Anesthetic: Lidocaine 1-2% Route: Infiltration ([place]/IM) IV Access: Declined Sedation: Declined    Indications: 1. Cervicalgia   2. Chronic pain  syndrome   3. Chronic low back pain (Primary Area of Pain) (Bilateral) (R>L)    Pain Score: Pre-procedure: 3  /10 Post-procedure: 0-No pain/10   Pre-op Assessment:  [name] is a 54 y.o. (year old), female patient, seen today for interventional treatment. She  has a past surgical history that includes Septoplasty (5/90); Nasal sinus surgery (11/93); Cesarean section (11/98); Tubal ligation (02/1998); Umbilical hernia repair (02/1998); Cholecystectomy (11/05); Joint replacement (Left, 2017); and Breast biopsy (Right, 12/05/2015). [name] has a current medication list which includes the following prescription(s): acetaminophen, alprazolam, cholecalciferol, vitamin b-12, epinephrine, famotidine, omeprazole, ondansetron, and sumatriptan. Her primarily concern today is the Neck Pain (left) and Back Pain  Initial Vital Signs:  Pulse/HCG Rate: 99  Temp:   Resp: 16 BP: (!) 123/89 SpO2: 100 %  BMI: Estimated body mass index is 22.68 kg/m as calculated from the following:   Height as of this encounter: '5\' 2"'  (1.575 m).   Weight as of this encounter: 124 lb (56.2 kg).  Risk Assessment: Allergies: Reviewed. She is allergic to codeine, decongest-aid [pseudoephedrine], flagyl [metronidazole], and sulfa antibiotics.  Allergy Precautions: None required Coagulopathies: Reviewed. None identified.  Blood-thinner therapy: None at this time Active Infection(s): Reviewed. None identified. [name] is afebrile  Site Confirmation: [name] was asked to confirm the procedure and laterality before marking the site Procedure checklist: Completed Consent: Before the procedure and under the influence of no sedative(s), amnesic(s), or anxiolytics, the patient was informed of the treatment options, risks and possible complications. To fulfill our ethical and legal obligations, as recommended by the American Medical Association's Code of Ethics, I have informed the patient of my clinical impression; the nature and purpose of the treatment or procedure; the risks, benefits, and possible complications of the intervention; the  alternatives, including doing nothing; the risk(s) and benefit(s) of the alternative treatment(s) or procedure(s); and the risk(s) and benefit(s) of doing nothing. The patient was provided information about the general risks and possible complications associated with the procedure. These may include, but are not limited to: failure to achieve desired goals, infection, bleeding, organ or nerve damage, allergic reactions, paralysis, and death. In addition, the patient was informed of those risks and complications associated to the procedure, such as failure to decrease pain; infection; bleeding; organ or nerve damage with subsequent damage to sensory, motor, and/or autonomic systems, resulting in permanent pain, numbness, and/or weakness of one or several areas of the body; allergic reactions; (i.e.: anaphylactic reaction); and/or death. Furthermore, the patient was informed of those risks and complications associated with the medications. These include, but are not limited to: allergic reactions (i.e.: anaphylactic or anaphylactoid reaction(s)); adrenal axis suppression; blood sugar elevation that in diabetics may result in ketoacidosis or comma; water retention that in patients with history of congestive heart failure may result in shortness of breath, pulmonary edema, and decompensation with resultant heart failure; weight gain; swelling or edema; medication-induced neural toxicity; particulate matter embolism and blood vessel occlusion with resultant organ, and/or nervous system infarction; and/or aseptic necrosis of one or more joints. Finally, the patient was informed that Medicine is not an exact science; therefore, there is also the possibility of unforeseen or unpredictable risks and/or possible complications that may result in a catastrophic outcome. The patient indicated having understood very clearly. We have given the patient no guarantees and we have made no promises. Enough time was given to the  patient to ask questions, all of which were answered to the patient's satisfaction. [name]  has indicated that she wanted to continue with the procedure. Attestation: I, the ordering provider, attest that I have discussed with the patient the benefits, risks, side-effects, alternatives, likelihood of achieving goals, and potential problems during recovery for the procedure that I have provided informed consent. Date  Time: 08/22/2019  8:10 AM  Pre-Procedure Preparation:  Monitoring: As per clinic protocol. Respiration, ETCO2, SpO2, BP, heart rate and rhythm monitor placed and checked for adequate function Safety Precautions: Patient was assessed for positional comfort and pressure points before starting the procedure. Time-out: I initiated and conducted the "Time-out" before starting the procedure, as per protocol. The patient was asked to participate by confirming the accuracy of the "Time Out" information. Verification of the correct person, site, and procedure were performed and confirmed by me, the nursing staff, and the patient. "Time-out" conducted as per Joint Commission's Universal Protocol (UP.01.01.01). Time: 0834  Description of Procedure:          DuraPrep (Iodine Povacrylex [0.7% available iodine] and Isopropyl Alcohol, 74% w/w) Safety Precautions: Aspiration looking for blood return was conducted prior to all injections. At no point did we inject any substances, as a needle was being advanced. No attempts were made at seeking any paresthesias. Safe injection practices and needle disposal techniques used. Medications properly checked for expiration dates. SDV (single dose vial) medications used. Description of the Procedure: Protocol guidelines were followed. The patient was placed in position over the fluoroscopy table. The target area was identified and the area prepped in the usual manner. Skin & deeper tissues infiltrated with local anesthetic. Appropriate amount of time allowed to  pass for local anesthetics to take effect. The procedure needles were then advanced to the target area. Proper needle placement secured. Negative aspiration confirmed. Solution injected in intermittent fashion, asking for systemic symptoms every 0.5cc of injectate. The needles were then removed and the area cleansed, making sure to leave some of the prepping solution back to take advantage of its long term bactericidal properties.  Vitals:   08/22/19 0809 08/22/19 0838  BP: (!) 123/89 122/80  Pulse: 99 89  Resp: 16 16  SpO2: 100% 100%  Weight: 124 lb (56.2 kg)   Height: '5\' 2"'  (1.575 m)     Start Time: 0834 hrs. End Time: 0835 hrs. Materials:  Needle(s) Type: Epidural needle Gauge: 20G Length: 3.5-in Medication(s): Please see orders for medications and dosing details.  Pharmacotherapy Assessment   Analgesic: None from our practice.   Monitoring: [place] PMP: PDMP reviewed during this encounter.       Pharmacotherapy: No side-effects or adverse reactions reported. Compliance: No problems identified. Effectiveness: Clinically acceptable.  Dewayne Shorter, RN  08/22/2019  8:17 AM  Sign when Signing Visit Safety precautions to be maintained throughout the outpatient stay will include: orient to surroundings, keep bed in low position, maintain call bell within reach at all times, provide assistance with transfer out of bed and ambulation.    UDS: No results found for: SUMMARY   ROS  Constitutional: Denies any fever or chills Gastrointestinal: No reported hemesis, hematochezia, vomiting, or acute GI distress Musculoskeletal: Denies any acute onset joint swelling, redness, loss of ROM, or weakness Neurological: No reported episodes of acute onset apraxia, aphasia, dysarthria, agnosia, amnesia, paralysis, loss of coordination, or loss of consciousness  Medication Review  ALPRAZolam, EPINEPHrine, SUMAtriptan, Vitamin B-12, acetaminophen, cholecalciferol, famotidine, omeprazole, and  ondansetron  History Review  Allergy: Ms. Casa is allergic to codeine, decongest-aid [pseudoephedrine], flagyl [metronidazole], and sulfa antibiotics. Drug: Ms.  Shannon [name]  reports no history of drug use. Alcohol:  reports no history of alcohol use. Tobacco:  reports that she quit smoking about 19 years ago. She has never used smokeless tobacco. Social: [name]  reports that she quit smoking about 19 years ago. She has never used smokeless tobacco. She reports that she does not drink alcohol and does not use drugs. Medical:  has a past medical history of Anemia, Anxiety, Breast pain, right (11/07/2012), Breathing difficulty (06/20/2014), Environmental allergies, GERD (gastroesophageal reflux disease), Hiatal hernia, History of migraine headaches, Hyperthyroidism, and Vaginitis (01/16/2013). Surgical: [name]  has a past surgical history that includes Septoplasty (5/90); Nasal sinus surgery (11/93); Cesarean section (11/98); Tubal ligation (02/1998); Umbilical hernia repair (02/1998); Cholecystectomy (11/05); Joint replacement (Left, 2017); and Breast biopsy (Right, 12/05/2015). Family: family history includes Breast cancer in an other family member; Breast cancer (age of onset: 44) in her mother; Heart disease in her father and maternal grandfather; Hypercholesterolemia in her father and mother; Hypertension in her father; Kidney cancer in her father; Leukemia in her father; Lung cancer in her paternal grandfather; Pancreatic cancer in her paternal grandfather; Prostate cancer in her paternal grandfather; Thyroid disease in her father and mother.  Laboratory Chemistry Profile   Renal Lab Results  Component Value Date   BUN 10 03/21/2019   CREATININE 0.73 03/21/2019   GFR 82.61 03/21/2019   GFRAA >60 05/02/2014   GFRNONAA >60 05/02/2014     Hepatic Lab Results  Component Value Date   AST 16 03/21/2019   ALT 16 03/21/2019   ALBUMIN 4.4 03/21/2019   ALKPHOS 63 03/21/2019      Electrolytes Lab Results  Component Value Date   NA 140 03/21/2019   K 3.7 03/21/2019   CL 103 03/21/2019   CALCIUM 9.9 03/21/2019     Bone Lab Results  Component Value Date   VD25OH 19.29 (L) 03/21/2019   25OHVITD1 28 (L) 10/05/2017   25OHVITD2 <1.0 10/05/2017   25OHVITD3 28 10/05/2017     Inflammation (CRP: Acute Phase) (ESR: Chronic Phase) Lab Results  Component Value Date   CRP 2 10/05/2017   ESRSEDRATE 7 10/05/2017       Note: Above Lab results reviewed.  Recent Imaging Review  US BREAST LTD UNI RIGHT INC AXILLA CLINICAL DATA:  Focal pain right nipple  EXAM: DIGITAL DIAGNOSTIC right MAMMOGRAM WITH CAD AND TOMO  ULTRASOUND right BREAST  COMPARISON:  Previous exam(s).  ACR Breast Density Category c: The breast tissue is heterogeneously dense, which may obscure small masses.  FINDINGS: Cc and MLO views of the right breast, spot tangential view of the right breast are submitted. No suspicious abnormality is identified.  Mammographic images were processed with CAD.  Targeted ultrasound is performed, showing no focal abnormal discrete or solid lesion in the retroareolar right breast.  IMPRESSION: Negative.  RECOMMENDATION: Routine screening mammogram back on schedule.  I have discussed the findings and recommendations with the patient. If applicable, a reminder letter will be sent to the patient regarding the next appointment.  BI-RADS CATEGORY  1: Negative.  Electronically Signed   By: Abelardo Diesel M.D.   On: 06/29/2019 14:32 MM DIAG BREAST TOMO UNI RIGHT CLINICAL DATA:  Focal pain right nipple  EXAM: DIGITAL DIAGNOSTIC right MAMMOGRAM WITH CAD AND TOMO  ULTRASOUND right BREAST  COMPARISON:  Previous exam(s).  ACR Breast Density Category c: The breast tissue is heterogeneously dense, which may obscure small masses.  FINDINGS: Cc and MLO views of the right  breast, spot tangential view of the right breast are submitted. No suspicious  abnormality is identified.  Mammographic images were processed with CAD.  Targeted ultrasound is performed, showing no focal abnormal discrete or solid lesion in the retroareolar right breast.  IMPRESSION: Negative.  RECOMMENDATION: Routine screening mammogram back on schedule.  I have discussed the findings and recommendations with the patient. If applicable, a reminder letter will be sent to the patient regarding the next appointment.  BI-RADS CATEGORY  1: Negative.  Electronically Signed   By: Abelardo Diesel M.D.   On: 06/29/2019 14:32 Note: Reviewed        Physical Exam  General appearance: Well nourished, well developed, and well hydrated. In no apparent acute distress Mental status: Alert, oriented x 3 (person, place, & time)       Respiratory: No evidence of acute respiratory distress Eyes: PERLA Vitals: BP 122/80   Pulse 89   Resp 16   Ht '5\' 2"'  (1.575 m)   Wt 124 lb (56.2 kg)   LMP 02/12/2013   SpO2 100%   BMI 22.68 kg/m  BMI: Estimated body mass index is 22.68 kg/m as calculated from the following:   Height as of this encounter: '5\' 2"'  (1.575 m).   Weight as of this encounter: 124 lb (56.2 kg). Ideal: Ideal body weight: 50.1 kg (110 lb 7.2 oz) Adjusted ideal body weight: 52.6 kg (115 lb 13.9 oz)  Assessment   Status Diagnosis  Controlled Controlled Controlled 1. Chronic pain syndrome   2. Cervicalgia   3. Cervical myofascial pain syndrome (Left)   4. Occipital neuralgia (Left)   5. Chronic low back pain (Primary Area of Pain) (Bilateral) (R>L)   6. Chronic sacroiliac joint pain (Bilateral) (R>L)      Updated Problems: Problem  Cervicalgia  Cervical myofascial pain syndrome (Left)   Splenius capitis muscle   Occipital neuralgia (Left)    Plan of Care  Problem-specific:  No problem-specific Assessment & Plan notes found for this encounter.  [name] has a current medication list which includes the following long-term medication(s):  famotidine and sumatriptan.  Pharmacotherapy (Medications Ordered): Meds ordered this encounter  Medications  . ropivacaine (PF) 2 mg/mL (0.2%) (NAROPIN) injection 4 mL  . triamcinolone acetonide (KENALOG-40) injection 40 mg   Orders:  Orders Placed This Encounter  Procedures  . TRIGGER POINT INJECTION    Scheduling Instructions:     Area: Neck     Side: Left     Sedation: No Sedation.     Timeframe: Today    Order Specific Question:   Where will this procedure be performed?    Answer:   [facility]  . DG Cervical Spine With Flex & Extend    Patient presents with axial pain with possible radicular component.  In addition to any acute findings, please report on:  1. Facet (Zygapophyseal) joint DJD (Hypertrophy, space narrowing, subchondral sclerosis, and/or osteophyte formation) 2. DDD and/or IVDD (Loss of disc height, desiccation or "Black disc disease") 3. Pars defects 4. Spondylolisthesis, spondylosis, and/or spondyloarthropathies (include Degree/Grade of displacement in mm) 5. Vertebral body Fractures, including age (old, new/acute) 79. Modic Type Changes 7. Demineralization 8. Bone pathology 9. Central, Lateral Recess, and/or Foraminal Stenosis (include AP diameter of stenosis in mm) 10. Surgical changes (hardware type, status, and presence of fibrosis) NOTE: Please specify level(s) and laterality. If applicable: Please indicate ROM and/or evidence of instability (>46m displacement between flexion and extension views)    Standing Status:  Future    Standing Expiration Date:   11/22/2019    Scheduling Instructions:     Please evaluate for any evidence of cervical spine instability. Describe the presence of any spondylolisthesis (Antero- or retrolisthesis). If present, provide displacement "Grade" and measurement in cm. Please describe presence and specific location (Level & Laterality) of any signs of      osteoarthritis, zygapophyseal (Facet) joints DJD (including  decreased joint space and/or osteophytosis), DDD, Foraminal narrowing, as well as any sclerosis and/or cyst formation. Please comment on ROM.    Order Specific Question:   Reason for Exam (SYMPTOM  OR DIAGNOSIS REQUIRED)    Answer:   Cervicalgia    Order Specific Question:   Is patient pregnant?    Answer:   No    Order Specific Question:   Preferred imaging location?    Answer:   [facility]    Order Specific Question:   Call Results- Best Contact Number?    Answer:   [phone] ([facility])    Order Specific Question:   Radiology Contrast Protocol - do NOT remove file path    Answer:   [email or web address]  . Informed Consent Details: Physician/Practitioner Attestation; Transcribe to consent form and obtain patient signature    Provider Attestation: I, [place] Dossie Arbour, MD, (Pain Management Specialist), the physician/practitioner, attest that I have discussed with the patient the benefits, risks, side effects, alternatives, likelihood of achieving goals and potential problems during recovery for the procedure that I have provided informed consent.    Scheduling Instructions:     Procedure: Myoneural Block (Trigger Point injection)     Indications: Musculoskeletal pain/myofascial pain secondary to trigger point     Note: Always confirm laterality of pain with [name], before procedure.     Transcribe to consent form and obtain patient signature.  . Provide equipment / supplies at bedside    Equipment required: Single use, disposable, "Block Tray"    Standing Status:   Standing    Number of Occurrences:   1    Order Specific Question:   Specify    Answer:   Block Tray   Follow-up plan:   Return in about 2 weeks (around 09/05/2019) for 15-min, VV, PP-e/m (PM on procedure day).  Trigger point injection of the right splenius capitis muscle #1, today     Considering:   Possible right-sided lumbar facet RFA#1 Possible right-sided  sacroiliac joint RFA#1 Possibleleft-sided sacroiliac joint RFA#1   Palliative PRN treatment(s):   Palliativeright gluteal MNB Palliative right SI joint block#5 Palliative left SI joint block#3 Palliative right L4-5LESI#2 Palliative left-sided L4-5 LESI #2 Palliativeright lumbar facet block #4 Diagnosticleft lumbar facet block #2 Diagnostic right great toe intra-articular injection #2 Palliative rightIAsmall joint injection1stMTP(DorsalMetatarsophalangeal)#2    Recent Visits No visits were found meeting these conditions. Showing recent visits within past 90 days and meeting all other requirements Today's Visits Date Type Provider Dept  08/22/19 Office Visit Milinda Pointer, MD [facility]  Showing today's visits and meeting all other requirements Future Appointments Date Type Provider Dept  09/05/19 Appointment Milinda Pointer, MD [facility]  Showing future appointments within next 90 days and meeting all other requirements  I discussed the assessment and treatment plan with the patient. The patient was provided an opportunity to ask questions and all were answered. The patient agreed with the plan and demonstrated an understanding of the instructions.  Patient advised to call back or seek an in-person evaluation if the symptoms or condition  worsens.  Duration of encounter: 25 minutes.  Note by: Shannon Cola, MD Date: 08/22/2019; Time: 9:38 AM

## 2019-08-22 ENCOUNTER — Other Ambulatory Visit: Payer: Self-pay

## 2019-08-22 ENCOUNTER — Encounter: Payer: Self-pay | Admitting: Pain Medicine

## 2019-08-22 ENCOUNTER — Ambulatory Visit: Payer: BC Managed Care – PPO | Attending: Pain Medicine | Admitting: Pain Medicine

## 2019-08-22 VITALS — BP 122/80 | HR 89 | Resp 16 | Ht 62.0 in | Wt 124.0 lb

## 2019-08-22 DIAGNOSIS — M533 Sacrococcygeal disorders, not elsewhere classified: Secondary | ICD-10-CM | POA: Insufficient documentation

## 2019-08-22 DIAGNOSIS — M7918 Myalgia, other site: Secondary | ICD-10-CM | POA: Diagnosis not present

## 2019-08-22 DIAGNOSIS — M545 Low back pain: Secondary | ICD-10-CM | POA: Diagnosis not present

## 2019-08-22 DIAGNOSIS — M5481 Occipital neuralgia: Secondary | ICD-10-CM | POA: Diagnosis not present

## 2019-08-22 DIAGNOSIS — G894 Chronic pain syndrome: Secondary | ICD-10-CM

## 2019-08-22 DIAGNOSIS — M542 Cervicalgia: Secondary | ICD-10-CM | POA: Insufficient documentation

## 2019-08-22 DIAGNOSIS — G8929 Other chronic pain: Secondary | ICD-10-CM | POA: Insufficient documentation

## 2019-08-22 MED ORDER — ROPIVACAINE HCL 2 MG/ML IJ SOLN
INTRAMUSCULAR | Status: AC
  Filled 2019-08-22: qty 10

## 2019-08-22 MED ORDER — ROPIVACAINE HCL 2 MG/ML IJ SOLN
4.0000 mL | Freq: Once | INTRAMUSCULAR | Status: AC
  Administered 2019-08-22: 4 mL

## 2019-08-22 MED ORDER — TRIAMCINOLONE ACETONIDE 40 MG/ML IJ SUSP
40.0000 mg | Freq: Once | INTRAMUSCULAR | Status: AC
  Administered 2019-08-22: 40 mg

## 2019-08-22 MED ORDER — TRIAMCINOLONE ACETONIDE 40 MG/ML IJ SUSP
INTRAMUSCULAR | Status: AC
  Filled 2019-08-22: qty 1

## 2019-08-22 NOTE — Progress Notes (Signed)
Safety precautions to be maintained throughout the outpatient stay will include: orient to surroundings, keep bed in low position, maintain call bell within reach at all times, provide assistance with transfer out of bed and ambulation.  

## 2019-08-22 NOTE — Patient Instructions (Addendum)
____________________________________________________________________________________________  Preparing for your procedure (without sedation)  Procedure appointments are limited to planned procedures: . No Prescription Refills. . No disability issues will be discussed. . No medication changes will be discussed.  Instructions: . Oral Intake: Do not eat or drink anything for at least 6 hours prior to your procedure. (Exception: Blood Pressure Medication. See below.) . Transportation: Unless otherwise stated by your physician, you may drive yourself after the procedure. . Blood Pressure Medicine: Do not forget to take your blood pressure medicine with a sip of water the morning of the procedure. If your Diastolic (lower reading)is above 100 mmHg, elective cases will be cancelled/rescheduled. . Blood thinners: These will need to be stopped for procedures. Notify our staff if you are taking any blood thinners. Depending on which one you take, there will be specific instructions on how and when to stop it. . Diabetics on insulin: Notify the staff so that you can be scheduled 1st case in the morning. If your diabetes requires high dose insulin, take only  of your normal insulin dose the morning of the procedure and notify the staff that you have done so. . Preventing infections: Shower with an antibacterial soap the morning of your procedure.  . Build-up your immune system: Take 1000 mg of Vitamin C with every meal (3 times a day) the day prior to your procedure. . Antibiotics: Inform the staff if you have a condition or reason that requires you to take antibiotics before dental procedures. . Pregnancy: If you are pregnant, call and cancel the procedure. . Sickness: If you have a cold, fever, or any active infections, call and cancel the procedure. . Arrival: You must be in the facility at least 30 minutes prior to your scheduled procedure. . Children: Do not bring any children with you. . Dress  appropriately: Bring dark clothing that you would not mind if they get stained. . Valuables: Do not bring any jewelry or valuables.  Reasons to call and reschedule or cancel your procedure: (Following these recommendations will minimize the risk of a serious complication.) . Surgeries: Avoid having procedures within 2 weeks of any surgery. (Avoid for 2 weeks before or after any surgery). . Flu Shots: Avoid having procedures within 2 weeks of a flu shots or . (Avoid for 2 weeks before or after immunizations). . Barium: Avoid having a procedure within 7-10 days after having had a radiological study involving the use of radiological contrast. (Myelograms, Barium swallow or enema study). . Heart attacks: Avoid any elective procedures or surgeries for the initial 6 months after a "Myocardial Infarction" (Heart Attack). . Blood thinners: It is imperative that you stop these medications before procedures. Let us know if you if you take any blood thinner.  . Infection: Avoid procedures during or within two weeks of an infection (including chest colds or gastrointestinal problems). Symptoms associated with infections include: Localized redness, fever, chills, night sweats or profuse sweating, burning sensation when voiding, cough, congestion, stuffiness, runny nose, sore throat, diarrhea, nausea, vomiting, cold or Flu symptoms, recent or current infections. It is specially important if the infection is over the area that we intend to treat. . Heart and lung problems: Symptoms that may suggest an active cardiopulmonary problem include: cough, chest pain, breathing difficulties or shortness of breath, dizziness, ankle swelling, uncontrolled high or unusually low blood pressure, and/or palpitations. If you are experiencing any of these symptoms, cancel your procedure and contact your primary care physician for an evaluation.  Remember:  Regular   Business hours are:  Monday to Thursday 8:00 AM to 4:00  PM  Provider's Schedule: Milinda Pointer, MD:  Procedure days: Tuesday and Thursday 7:30 AM to 4:00 PM  Gillis Santa, MD:  Procedure days: Monday and Wednesday 7:30 AM to 4:00 PM ____________________________________________________________________________________________   Post-procedure Information What to expect: Most procedures involve the use of a local anesthetic (numbing medicine), and a steroid (anti-inflammatory medicine).  The local anesthetics may cause temporary numbness and weakness of the legs or arms, depending on the location of the block. This numbness/weakness may last 4-6 hours, depending on the local anesthetic used. In rare instances, it can last up to 24 hours. While numb, you must be very careful not to injure the extremity.  After any procedure, you could expect the pain to get better within 15-20 minutes. This relief is temporary and may last 4-6 hours. Once the local anesthetics wears off, you could experience discomfort, possibly more than usual, for up to 10 (ten) days. In the case of radiofrequencies, it may last up to 6 weeks. Surgeries may take up to 8 weeks for the healing process. The discomfort is due to the irritation caused by needles going through skin and muscle. To minimize the discomfort, we recommend using ice the first day, and heat from then on. The ice should be applied for 15 minutes on, and 15 minutes off. Keep repeating this cycle until bedtime. Avoid applying the ice directly to the skin, to prevent frostbite. Heat should be used daily, until the pain improves (4-10 days). Be careful not to burn yourself.  Occasionally you may experience muscle spasms or cramps. These occur as a consequence of the irritation caused by the needle sticks to the muscle and the blood that will inevitably be lost into the surrounding muscle tissue. Blood tends to be very irritating to tissues, which tend to react by going into spasm. These spasms may start the same day  of your procedure, but they may also take days to develop. This late onset type of spasm or cramp is usually caused by electrolyte imbalances triggered by the steroids, at the level of the kidney. Cramps and spasms tend to respond well to muscle relaxants, multivitamins (some are triggered by the procedure, but may have their origins in vitamin deficiencies), and Gatorade, or any sports drinks that can replenish any electrolyte imbalances. (If you are a diabetic, ask your pharmacist to get you a sugar-free brand.) Warm showers or baths may also be helpful. Stretching exercises are highly recommended. General Instructions:  Be alert for signs of possible infection: redness, swelling, heat, red streaks, elevated temperature, and/or fever. These typically appear 4 to 6 days after the procedure. Immediately notify your doctor if you experience unusual bleeding, difficulty breathing, or loss of bowel or bladder control. If you experience increased pain, do not increase your pain medicine intake, unless instructed by your pain physician. Post-Procedure Care:  Be careful in moving about. Muscle spasms in the area of the injection may occur. Applying ice or heat to the area is often helpful. The incidence of spinal headaches after epidural injections ranges between 1.4% and 6%. If you develop a headache that does not seem to respond to conservative therapy, please let your physician know. This can be treated with an epidural blood patch.   Post-procedure numbness or redness is to be expected, however it should average 4 to 6 hours. If numbness and weakness of your extremities begins to develop 4 to 6 hours after your procedure,  and is felt to be progressing and worsening, immediately contact your physician.   Diet:  If you experience nausea, do not eat until this sensation goes away. If you had a Stellate Ganglion Block for upper extremity Reflex Sympathetic Dystrophy, do not eat or drink until your hoarseness  goes away. In any case, always start with liquids first and if you tolerate them well, then slowly progress to more solid foods. Activity:  For the first 4 to 6 hours after the procedure, use caution in moving about as you may experience numbness and/or weakness. Use caution in cooking, using household electrical appliances, and climbing steps. If you need to reach your Doctor call our office: [phone]) [phone] Monday-Thursday 8:00 am - 4:00 PM    Fridays: Closed     In case of an emergency: In case of emergency, call 911 or go to the nearest emergency room and have the physician there call us.  Interpretation of Procedure Every nerve block has two components: a diagnostic component, and a treatment component. Unrealistic expectations are the most common causes of perceived failure.  In a perfect world, a single nerve block should be able to completely and permanently eliminate the pain. Sadly, the world is not perfect.  Most pain management nerve blocks are performed using local anesthetics and steroids. Steroids are responsible for any long-term benefit that you may experience. Their purpose is to decrease any chronic swelling that may exist in the area. Steroids begin to work immediately after being injected. However, most patients will not experience any benefits until 5 to 10 days after the injection, when the swelling has come down to the point where they can tell a difference. Steroids will only help if there is swelling to be treated. As such, they can assist with the diagnosis. If effective, they suggest an inflammatory component to the pain, and if ineffective, they rule out inflammation as the main cause or component of the problem. If the problem is one of mechanical compression, you will get no benefit from those steroids.   In the case of local anesthetics, they have a crucial role in the diagnosis of your condition. Most will begin to work within15 to 20 minutes after injection. The  duration will depend on the type used (short- vs. Long-acting). It is of outmost importance that patients keep tract of their pain, after the procedure. To assist with this matter, a Post-procedure Pain Diary is provided. Make sure to complete it and to bring it back to your follow-up appointment.  As long as the patient keeps accurate, detailed records of their symptoms after every procedure, and returns to have those interpreted, every procedure will provide [place] with invaluable information. Even a block that does not provide the patient with any relief, will always provide [place] with information about the mechanism and the origin of the pain. The only time a nerve block can be considered a waste of time is when patients do not keep track of the results, or do not keep their post-procedure appointment.  Reporting the results back to your physician The Pain Score  Pain is a subjective complaint. It cannot be seen, touched, or measured. We depend entirely on the patients report of the pain in order to assess your condition and treatment. To evaluate the pain, we use a pain scale, where 0 means No Pain, and a 10 is the worst possible pain that you can even imagine (i.e. something like been eaten alive by a shark or being torn  apart by a lion).   You will frequently be asked to rate your pain. Please be as accurate, remember that medical decisions will be based on your responses. Please do not rate your pain above a 10. Doing so is actually interpreted as symptom magnification (exaggeration), as well as lack of understanding with regards to the scale. To put this into perspective, when you tell us that your pain is at a 10 (ten), what you are saying is that there is nothing we can do to make this pain any worse. (Carefully think about that.)  ____________________________________________________________________________________________  Post-Procedure Discharge Instructions  Instructions:  Apply  ice:   Purpose: This will minimize any swelling and discomfort after procedure.   When: Day of procedure, as soon as you get home.  How: Fill a plastic sandwich bag with crushed ice. Cover it with a small towel and apply to injection site.  How long: (15 min on, 15 min off) Apply for 15 minutes then remove x 15 minutes.  Repeat sequence on day of procedure, until you go to bed.  Apply heat:   Purpose: To treat any soreness and discomfort from the procedure.  When: Starting the next day after the procedure.  How: Apply heat to procedure site starting the day following the procedure.  How long: May continue to repeat daily, until discomfort goes away.  Food intake: Start with clear liquids (like water) and advance to regular food, as tolerated.   Physical activities: Keep activities to a minimum for the first 8 hours after the procedure. After that, then as tolerated.  Driving: If you have received any sedation, be responsible and do not drive. You are not allowed to drive for 24 hours after having sedation.  Blood thinner: (Applies only to those taking blood thinners) You may restart your blood thinner 6 hours after your procedure.  Insulin: (Applies only to Diabetic patients taking insulin) As soon as you can eat, you may resume your normal dosing schedule.  Infection prevention: Keep procedure site clean and dry. Shower daily and clean area with soap and water.  Post-procedure Pain Diary: Extremely important that this be done correctly and accurately. Recorded information will be used to determine the next step in treatment. For the purpose of accuracy, follow these rules:  Evaluate only the area treated. Do not report or include pain from an untreated area. For the purpose of this evaluation, ignore all other areas of pain, except for the treated area.  After your procedure, avoid taking a long nap and attempting to complete the pain diary after you wake up. Instead, set your  alarm clock to go off every hour, on the hour, for the initial 8 hours after the procedure. Document the duration of the numbing medicine, and the relief you are getting from it.  Do not go to sleep and attempt to complete it later. It will not be accurate. If you received sedation, it is likely that you were given a medication that may cause amnesia. Because of this, completing the diary at a later time may cause the information to be inaccurate. This information is needed to plan your care.  Follow-up appointment: Keep your post-procedure follow-up evaluation appointment after the procedure (usually 2 weeks for most procedures, 6 weeks for radiofrequencies). DO NOT FORGET to bring you pain diary with you.   Expect: (What should I expect to see with my procedure?)  From numbing medicine (AKA: Local Anesthetics): Numbness or decrease in pain. You may also experience  some weakness, which if present, could last for the duration of the local anesthetic.  Onset: Full effect within 15 minutes of injected.  Duration: It will depend on the type of local anesthetic used. On the average, 1 to 8 hours.   From steroids (Applies only if steroids were used): Decrease in swelling or inflammation. Once inflammation is improved, relief of the pain will follow.  Onset of benefits: Depends on the amount of swelling present. The more swelling, the longer it will take for the benefits to be seen. In some cases, up to 10 days.  Duration: Steroids will stay in the system x 2 weeks. Duration of benefits will depend on multiple posibilities including persistent irritating factors.  Side-effects: If present, they may typically last 2 weeks (the duration of the steroids).  Frequent: Cramps (if they occur, drink Gatorade and take over-the-counter Magnesium 450-500 mg once to twice a day); water retention with temporary weight gain; increases in blood sugar; decreased immune system response; increased  appetite.  Occasional: Facial flushing (red, warm cheeks); mood swings; menstrual changes.  Uncommon: Long-term decrease or suppression of natural hormones; bone thinning. (These are more common with higher doses or more frequent use. This is why we prefer that our patients avoid having any injection therapies in other practices.)   Very Rare: Severe mood changes; psychosis; aseptic necrosis.  From procedure: Some discomfort is to be expected once the numbing medicine wears off. This should be minimal if ice and heat are applied as instructed.  Call if: (When should I call?)  You experience numbness and weakness that gets worse with time, as opposed to wearing off.  New onset bowel or bladder incontinence. (Applies only to procedures done in the spine)  Emergency Numbers:  Durning business hours (Monday - Thursday, 8:00 AM - 4:00 PM) (Friday, 9:00 AM - 12:00 Noon): [phone]  After hours: [phone]  NOTE: If you are having a problem and are unable connect with, or to talk to a provider, then go to your nearest urgent care or emergency department. If the problem is serious and urgent, please call 911. ____________________________________________________________________________________________

## 2019-08-23 DIAGNOSIS — J3089 Other allergic rhinitis: Secondary | ICD-10-CM | POA: Diagnosis not present

## 2019-08-23 DIAGNOSIS — J301 Allergic rhinitis due to pollen: Secondary | ICD-10-CM | POA: Diagnosis not present

## 2019-08-30 DIAGNOSIS — J3089 Other allergic rhinitis: Secondary | ICD-10-CM | POA: Diagnosis not present

## 2019-08-30 DIAGNOSIS — J301 Allergic rhinitis due to pollen: Secondary | ICD-10-CM | POA: Diagnosis not present

## 2019-08-30 DIAGNOSIS — J3081 Allergic rhinitis due to animal (cat) (dog) hair and dander: Secondary | ICD-10-CM | POA: Diagnosis not present

## 2019-09-02 ENCOUNTER — Encounter: Payer: Self-pay | Admitting: Pain Medicine

## 2019-09-02 ENCOUNTER — Other Ambulatory Visit: Payer: Self-pay

## 2019-09-02 ENCOUNTER — Ambulatory Visit
Admission: RE | Admit: 2019-09-02 | Discharge: 2019-09-02 | Disposition: A | Discharge status: Home / Self Care | Payer: BC Managed Care – PPO | Source: Ambulatory Visit | Attending: Pain Medicine | Admitting: Pain Medicine

## 2019-09-02 ENCOUNTER — Ambulatory Visit
Admission: RE | Admit: 2019-09-02 | Discharge: 2019-09-02 | Disposition: A | Discharge status: Home / Self Care | Payer: BC Managed Care – PPO | Attending: Pain Medicine | Admitting: Pain Medicine

## 2019-09-02 DIAGNOSIS — M542 Cervicalgia: Secondary | ICD-10-CM | POA: Diagnosis not present

## 2019-09-02 DIAGNOSIS — M47812 Spondylosis without myelopathy or radiculopathy, cervical region: Secondary | ICD-10-CM | POA: Diagnosis not present

## 2019-09-02 DIAGNOSIS — M4692 Unspecified inflammatory spondylopathy, cervical region: Secondary | ICD-10-CM | POA: Diagnosis not present

## 2019-09-02 DIAGNOSIS — M4312 Spondylolisthesis, cervical region: Secondary | ICD-10-CM | POA: Diagnosis not present
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
## 2019-09-05 ENCOUNTER — Ambulatory Visit: Payer: BC Managed Care – PPO | Attending: Pain Medicine | Admitting: Pain Medicine

## 2019-09-05 ENCOUNTER — Other Ambulatory Visit: Payer: Self-pay

## 2019-09-05 DIAGNOSIS — G894 Chronic pain syndrome: Secondary | ICD-10-CM

## 2019-09-05 DIAGNOSIS — M4310 Spondylolisthesis, site unspecified: Secondary | ICD-10-CM | POA: Diagnosis not present

## 2019-09-05 DIAGNOSIS — M5030 Other cervical disc degeneration, unspecified cervical region: Secondary | ICD-10-CM | POA: Insufficient documentation

## 2019-09-05 DIAGNOSIS — M7918 Myalgia, other site: Secondary | ICD-10-CM

## 2019-09-05 DIAGNOSIS — M542 Cervicalgia: Secondary | ICD-10-CM

## 2019-09-05 DIAGNOSIS — M47812 Spondylosis without myelopathy or radiculopathy, cervical region: Secondary | ICD-10-CM

## 2019-09-05 NOTE — Patient Instructions (Signed)
____________________________________________________________________________________________  Preparing for Procedure with Sedation  Procedure appointments are limited to planned procedures: . No Prescription Refills. . No disability issues will be discussed. . No medication changes will be discussed.  Instructions: . Oral Intake: Do not eat or drink anything for at least 8 hours prior to your procedure. (Exception: Blood Pressure Medication. See below.) . Transportation: Unless otherwise stated by your physician, you may drive yourself after the procedure. . Blood Pressure Medicine: Do not forget to take your blood pressure medicine with a sip of water the morning of the procedure. If your Diastolic (lower reading)is above 100 mmHg, elective cases will be cancelled/rescheduled. . Blood thinners: These will need to be stopped for procedures. Notify our staff if you are taking any blood thinners. Depending on which one you take, there will be specific instructions on how and when to stop it. . Diabetics on insulin: Notify the staff so that you can be scheduled 1st case in the morning. If your diabetes requires high dose insulin, take only  of your normal insulin dose the morning of the procedure and notify the staff that you have done so. . Preventing infections: Shower with an antibacterial soap the morning of your procedure. . Build-up your immune system: Take 1000 mg of Vitamin C with every meal (3 times a day) the day prior to your procedure. . Antibiotics: Inform the staff if you have a condition or reason that requires you to take antibiotics before dental procedures. . Pregnancy: If you are pregnant, call and cancel the procedure. . Sickness: If you have a cold, fever, or any active infections, call and cancel the procedure. . Arrival: You must be in the facility at least 30 minutes prior to your scheduled procedure. . Children: Do not bring children with you. . Dress appropriately:  Bring dark clothing that you would not mind if they get stained. . Valuables: Do not bring any jewelry or valuables.  Reasons to call and reschedule or cancel your procedure: (Following these recommendations will minimize the risk of a serious complication.) . Surgeries: Avoid having procedures within 2 weeks of any surgery. (Avoid for 2 weeks before or after any surgery). . Flu Shots: Avoid having procedures within 2 weeks of a flu shots or . (Avoid for 2 weeks before or after immunizations). . Barium: Avoid having a procedure within 7-10 days after having had a radiological study involving the use of radiological contrast. (Myelograms, Barium swallow or enema study). . Heart attacks: Avoid any elective procedures or surgeries for the initial 6 months after a "Myocardial Infarction" (Heart Attack). . Blood thinners: It is imperative that you stop these medications before procedures. Let us know if you if you take any blood thinner.  . Infection: Avoid procedures during or within two weeks of an infection (including chest colds or gastrointestinal problems). Symptoms associated with infections include: Localized redness, fever, chills, night sweats or profuse sweating, burning sensation when voiding, cough, congestion, stuffiness, runny nose, sore throat, diarrhea, nausea, vomiting, cold or Flu symptoms, recent or current infections. It is specially important if the infection is over the area that we intend to treat. . Heart and lung problems: Symptoms that may suggest an active cardiopulmonary problem include: cough, chest pain, breathing difficulties or shortness of breath, dizziness, ankle swelling, uncontrolled high or unusually low blood pressure, and/or palpitations. If you are experiencing any of these symptoms, cancel your procedure and contact your primary care physician for an evaluation.  Remember:  Regular Business hours are:    Monday to Thursday 8:00 AM to 4:00 PM  Provider's  Schedule: Kamie Korber, MD:  Procedure days: Tuesday and Thursday 7:30 AM to 4:00 PM  Bilal Lateef, MD:  Procedure days: Monday and Wednesday 7:30 AM to 4:00 PM ____________________________________________________________________________________________    

## 2019-09-06 ENCOUNTER — Other Ambulatory Visit: Payer: Self-pay

## 2019-09-06 ENCOUNTER — Ambulatory Visit (INDEPENDENT_AMBULATORY_CARE_PROVIDER_SITE_OTHER): Payer: BC Managed Care – PPO

## 2019-09-06 DIAGNOSIS — J301 Allergic rhinitis due to pollen: Secondary | ICD-10-CM | POA: Diagnosis not present

## 2019-09-06 DIAGNOSIS — J3089 Other allergic rhinitis: Secondary | ICD-10-CM | POA: Diagnosis not present

## 2019-09-06 DIAGNOSIS — E538 Deficiency of other specified B group vitamins: Secondary | ICD-10-CM | POA: Diagnosis not present

## 2019-09-06 MED ORDER — CYANOCOBALAMIN 1000 MCG/ML IJ SOLN
1000.0000 ug | Freq: Once | INTRAMUSCULAR | Status: AC
  Administered 2019-09-06: 1000 ug via INTRAMUSCULAR

## 2019-09-06 NOTE — Progress Notes (Addendum)
Patient presented for B 12 injection to left deltoid, patient voiced no concerns nor showed any signs of distress during injection.  Reviewed.  Dr Scott 

## 2019-09-08 ENCOUNTER — Ambulatory Visit: Payer: BC Managed Care – PPO | Admitting: Internal Medicine

## 2019-09-13 ENCOUNTER — Other Ambulatory Visit: Payer: Self-pay

## 2019-09-13 ENCOUNTER — Ambulatory Visit (HOSPITAL_BASED_OUTPATIENT_CLINIC_OR_DEPARTMENT_OTHER): Payer: BC Managed Care – PPO | Admitting: Pain Medicine

## 2019-09-13 ENCOUNTER — Ambulatory Visit
Admission: RE | Admit: 2019-09-13 | Discharge: 2019-09-13 | Disposition: A | Discharge status: Home / Self Care | Payer: BC Managed Care – PPO | Source: Ambulatory Visit | Attending: Pain Medicine | Admitting: Pain Medicine

## 2019-09-13 ENCOUNTER — Encounter: Payer: Self-pay | Admitting: Pain Medicine

## 2019-09-13 VITALS — BP 140/81 | HR 72 | Temp 97.5°F | Resp 14 | Ht 62.0 in | Wt 124.0 lb

## 2019-09-13 DIAGNOSIS — M7918 Myalgia, other site: Secondary | ICD-10-CM

## 2019-09-13 DIAGNOSIS — M542 Cervicalgia: Secondary | ICD-10-CM | POA: Diagnosis not present

## 2019-09-13 DIAGNOSIS — M47812 Spondylosis without myelopathy or radiculopathy, cervical region: Secondary | ICD-10-CM | POA: Diagnosis not present

## 2019-09-13 MED ORDER — ROPIVACAINE HCL 2 MG/ML IJ SOLN
9.0000 mL | Freq: Once | INTRAMUSCULAR | Status: AC
  Administered 2019-09-13: 9 mL via PERINEURAL

## 2019-09-13 MED ORDER — FENTANYL CITRATE (PF) 100 MCG/2ML IJ SOLN
25.0000 ug | INTRAMUSCULAR | Status: DC | PRN
  Administered 2019-09-13: 50 ug via INTRAVENOUS

## 2019-09-13 MED ORDER — LACTATED RINGERS IV SOLN
1000.0000 mL | Freq: Once | INTRAVENOUS | Status: AC
  Administered 2019-09-13: 1000 mL via INTRAVENOUS

## 2019-09-13 MED ORDER — FENTANYL CITRATE (PF) 100 MCG/2ML IJ SOLN
INTRAMUSCULAR | Status: AC
  Filled 2019-09-13: qty 2

## 2019-09-13 MED ORDER — MIDAZOLAM HCL 5 MG/5ML IJ SOLN
1.0000 mg | INTRAMUSCULAR | Status: DC | PRN
  Administered 2019-09-13: 2 mg via INTRAVENOUS

## 2019-09-13 MED ORDER — ROPIVACAINE HCL 2 MG/ML IJ SOLN
INTRAMUSCULAR | Status: AC
  Filled 2019-09-13: qty 10

## 2019-09-13 MED ORDER — TRIAMCINOLONE ACETONIDE 40 MG/ML IJ SUSP
INTRAMUSCULAR | Status: AC
  Filled 2019-09-13: qty 1

## 2019-09-13 MED ORDER — DEXAMETHASONE SODIUM PHOSPHATE 10 MG/ML IJ SOLN
10.0000 mg | Freq: Once | INTRAMUSCULAR | Status: AC
  Administered 2019-09-13: 10 mg

## 2019-09-13 MED ORDER — LIDOCAINE HCL 2 % IJ SOLN
20.0000 mL | Freq: Once | INTRAMUSCULAR | Status: AC
  Administered 2019-09-13: 400 mg

## 2019-09-13 MED ORDER — LIDOCAINE HCL 2 % IJ SOLN
INTRAMUSCULAR | Status: AC
  Filled 2019-09-13: qty 20

## 2019-09-13 MED ORDER — MIDAZOLAM HCL 5 MG/5ML IJ SOLN
INTRAMUSCULAR | Status: AC
  Filled 2019-09-13: qty 5

## 2019-09-13 NOTE — Progress Notes (Signed)
PROVIDER NOTE: Information contained herein reflects review and annotations entered in association with encounter. Interpretation of such information and data should be left to medically-trained personnel. Information provided to patient can be located elsewhere in the medical record under "Patient Instructions". Document created using STT-dictation technology, any transcriptional errors that may result from process are unintentional.    Patient: Shannon [name]  Service Category: Procedure  Provider: Gaspar Cola, MD  DOB: Aug 14, 1965  DOS: 09/13/2019  Location: [facility]  MRN: [id number]  Setting: Ambulatory - outpatient  Referring Provider: [name], MD  Type: Established Patient  Specialty: Interventional Pain Management  PCP: [name], MD   Primary Reason for Visit: Interventional Pain Management Treatment. CC: Neck Pain  Procedure:          Anesthesia, Analgesia, Anxiolysis:  Type: Cervical Facet Medial Branch Block(s)  #1  Primary Purpose: Diagnostic Region: Posterolateral cervical spine Level: C3, C4, C5, C6, & C7 Medial Branch Level(s). Injecting these levels blocks the C3-4, C4-5, C5-6, and C6-7 cervical facet joints. Laterality: Left  Type: Moderate (Conscious) Sedation combined with Local Anesthesia Indication(s): Analgesia and Anxiety Route: Intravenous (IV) IV Access: Secured Sedation: Meaningful verbal contact was maintained at all times during the procedure  Local Anesthetic: Lidocaine 1-2%  Position: Prone with head of the table raised to facilitate breathing.   Indications: 1. Cervicalgia   2. Cervical facet syndrome (Bilateral)   3. Cervical facet arthropathy (Bilateral) (L>R)   4. Cervical myofascial pain syndrome (Left)    Pain Score: Pre-procedure: 3 /10 Post-procedure: 0-No pain/10   Pre-op Assessment:  Shannon [name] is a 54 y.o. (year old), female patient, seen today for interventional treatment. She  has a past surgical  history that includes Septoplasty (5/90); Nasal sinus surgery (11/93); Cesarean section (11/98); Tubal ligation (02/1998); Umbilical hernia repair (02/1998); Cholecystectomy (11/05); Joint replacement (Left, 2017); and Breast biopsy (Right, 12/05/2015). Shannon [name] has a current medication list which includes the following prescription(s): acetaminophen, alprazolam, cholecalciferol, vitamin b-12, epinephrine, famotidine, omeprazole, ondansetron, and sumatriptan, and the following Facility-Administered Medications: fentanyl and midazolam. Her primarily concern today is the Neck Pain  Initial Vital Signs:  Pulse/HCG Rate: 70ECG Heart Rate: 73 Temp: (!) 97.4 F (36.3 C) Resp: 16 BP: 130/81 SpO2: 100 %  BMI: Estimated body mass index is 22.68 kg/m as calculated from the following:   Height as of this encounter: 5\' 2"  (1.575 m).   Weight as of this encounter: 124 lb (56.2 kg).  Risk Assessment: Allergies: Reviewed. She is allergic to codeine, decongest-aid [pseudoephedrine], flagyl [metronidazole], and sulfa antibiotics.  Allergy Precautions: None required Coagulopathies: Reviewed. None identified.  Blood-thinner therapy: None at this time Active Infection(s): Reviewed. None identified. Shannon [name] is afebrile  Site Confirmation: Shannon [name] was asked to confirm the procedure and laterality before marking the site Procedure checklist: Completed Consent: Before the procedure and under the influence of no sedative(s), amnesic(s), or anxiolytics, the patient was informed of the treatment options, risks and possible complications. To fulfill our ethical and legal obligations, as recommended by the American Medical Association's Code of Ethics, I have informed the patient of my clinical impression; the nature and purpose of the treatment or procedure; the risks, benefits, and possible complications of the intervention; the alternatives, including doing nothing; the risk(s) and benefit(s) of the alternative  treatment(s) or procedure(s); and the risk(s) and benefit(s) of doing nothing. The patient was provided information about the general risks and possible complications associated with the procedure. These may include,  but are not limited to: failure to achieve desired goals, infection, bleeding, organ or nerve damage, allergic reactions, paralysis, and death. In addition, the patient was informed of those risks and complications associated to Spine-related procedures, such as failure to decrease pain; infection (i.e.: Meningitis, epidural or intraspinal abscess); bleeding (i.e.: epidural hematoma, subarachnoid hemorrhage, or any other type of intraspinal or peri-dural bleeding); organ or nerve damage (i.e.: Any type of peripheral nerve, nerve root, or spinal cord injury) with subsequent damage to sensory, motor, and/or autonomic systems, resulting in permanent pain, numbness, and/or weakness of one or several areas of the body; allergic reactions; (i.e.: anaphylactic reaction); and/or death. Furthermore, the patient was informed of those risks and complications associated with the medications. These include, but are not limited to: allergic reactions (i.e.: anaphylactic or anaphylactoid reaction(s)); adrenal axis suppression; blood sugar elevation that in diabetics may result in ketoacidosis or comma; water retention that in patients with history of congestive heart failure may result in shortness of breath, pulmonary edema, and decompensation with resultant heart failure; weight gain; swelling or edema; medication-induced neural toxicity; particulate matter embolism and blood vessel occlusion with resultant organ, and/or nervous system infarction; and/or aseptic necrosis of one or more joints. Finally, the patient was informed that Medicine is not an exact science; therefore, there is also the possibility of unforeseen or unpredictable risks and/or possible complications that may result in a catastrophic  outcome. The patient indicated having understood very clearly. We have given the patient no guarantees and we have made no promises. Enough time was given to the patient to ask questions, all of which were answered to the patient's satisfaction. Ms. Alperin has indicated that she wanted to continue with the procedure. Attestation: I, the ordering provider, attest that I have discussed with the patient the benefits, risks, side-effects, alternatives, likelihood of achieving goals, and potential problems during recovery for the procedure that I have provided informed consent. Date  Time: 09/13/2019  9:08 AM  Pre-Procedure Preparation:  Monitoring: As per clinic protocol. Respiration, ETCO2, SpO2, BP, heart rate and rhythm monitor placed and checked for adequate function Safety Precautions: Patient was assessed for positional comfort and pressure points before starting the procedure. Time-out: I initiated and conducted the "Time-out" before starting the procedure, as per protocol. The patient was asked to participate by confirming the accuracy of the "Time Out" information. Verification of the correct person, site, and procedure were performed and confirmed by me, the nursing staff, and the patient. "Time-out" conducted as per Joint Commission's Universal Protocol (UP.01.01.01). Time: 0937  Description of Procedure:          Laterality: Left Level: C3, C4, C5, C6, & C7 Medial Branch Level(s). Area Prepped: Posterior Cervico-thoracic Region DuraPrep (Iodine Povacrylex [0.7% available iodine] and Isopropyl Alcohol, 74% w/w) Safety Precautions: Aspiration looking for blood return was conducted prior to all injections. At no point did we inject any substances, as a needle was being advanced. Before injecting, the patient was told to immediately notify me if she was experiencing any new onset of "ringing in the ears, or metallic taste in the mouth". No attempts were made at seeking any paresthesias. Safe  injection practices and needle disposal techniques used. Medications properly checked for expiration dates. SDV (single dose vial) medications used. After the completion of the procedure, all disposable equipment used was discarded in the proper designated medical waste containers. Local Anesthesia: Protocol guidelines were followed. The patient was positioned over the fluoroscopy table. The area was prepped in the  usual manner. The time-out was completed. The target area was identified using fluoroscopy. A 12-in long, straight, sterile hemostat was used with fluoroscopic guidance to locate the targets for each level blocked. Once located, the skin was marked with an approved surgical skin marker. Once all sites were marked, the skin (epidermis, dermis, and hypodermis), as well as deeper tissues (fat, connective tissue and muscle) were infiltrated with a small amount of a short-acting local anesthetic, loaded on a 10cc syringe with a 25G, 1.5-in  Needle. An appropriate amount of time was allowed for local anesthetics to take effect before proceeding to the next step. Local Anesthetic: Lidocaine 2.0% The unused portion of the local anesthetic was discarded in the proper designated containers. Technical explanation of process:  C3 Medial Branch Nerve Block (MBB): The target area for the C3 dorsal medial articular branch is the lateral concave waist of the articular pillar of C3. Under fluoroscopic guidance, a Quincke needle was inserted until contact was made with os over the postero-lateral aspect of the articular pillar of C3 (target area). After negative aspiration for blood, 0.5 mL of the nerve block solution was injected without difficulty or complication. The needle was removed intact. C4 Medial Branch Nerve Block (MBB): The target area for the C4 dorsal medial articular branch is the lateral concave waist of the articular pillar of C4. Under fluoroscopic guidance, a Quincke needle was inserted until  contact was made with os over the postero-lateral aspect of the articular pillar of C4 (target area). After negative aspiration for blood, 0.5 mL of the nerve block solution was injected without difficulty or complication. The needle was removed intact. C5 Medial Branch Nerve Block (MBB): The target area for the C5 dorsal medial articular branch is the lateral concave waist of the articular pillar of C5. Under fluoroscopic guidance, a Quincke needle was inserted until contact was made with os over the postero-lateral aspect of the articular pillar of C5 (target area). After negative aspiration for blood, 0.5 mL of the nerve block solution was injected without difficulty or complication. The needle was removed intact. C6 Medial Branch Nerve Block (MBB): The target area for the C6 dorsal medial articular branch is the lateral concave waist of the articular pillar of C6. Under fluoroscopic guidance, a Quincke needle was inserted until contact was made with os over the postero-lateral aspect of the articular pillar of C6 (target area). After negative aspiration for blood, 0.5 mL of the nerve block solution was injected without difficulty or complication. The needle was removed intact. C7 Medial Branch Nerve Block (MBB): The target for the C7 dorsal medial articular branch lies on the superior-medial tip of the C7 transverse process. Under fluoroscopic guidance, a Quincke needle was inserted until contact was made with os over the postero-lateral aspect of the articular pillar of C7 (target area). After negative aspiration for blood, 0.5 mL of the nerve block solution was injected without difficulty or complication. The needle was removed intact. Procedural Needles: 22-gauge, 3.5-inch, Quincke needles used for all levels. Nerve block solution: 0.2% PF-Ropivacaine + Triamcinolone (40 mg/mL) diluted to a final concentration of 4 mg of Triamcinolone/mL of Ropivacaine The unused portion of the solution was discarded in  the proper designated containers.  Once the entire procedure was completed, the treated area was cleaned, making sure to leave some of the prepping solution back to take advantage of its long term bactericidal properties.  Anatomy Reference Guide:       Vitals:   09/13/19 0943 09/13/19  5852 09/13/19 1003 09/13/19 1013  BP: 124/80 (!) 152/94 (!) 143/85 140/81  Pulse: 71 72    Resp: 13 14 14 14   Temp:  97.6 F (36.4 C)  (!) 97.5 F (36.4 C)  TempSrc:      SpO2: 96% 99% 100% 100%  Weight:      Height:        Start Time: 0938 hrs. End Time: 0943 hrs.  Imaging Guidance (Spinal):          Type of Imaging Technique: Fluoroscopy Guidance (Spinal) Indication(s): Assistance in needle guidance and placement for procedures requiring needle placement in or near specific anatomical locations not easily accessible without such assistance. Exposure Time: Please see nurses notes. Contrast: None used. Fluoroscopic Guidance: I was personally present during the use of fluoroscopy. "Tunnel Vision Technique" used to obtain the best possible view of the target area. Parallax error corrected before commencing the procedure. "Direction-depth-direction" technique used to introduce the needle under continuous pulsed fluoroscopy. Once target was reached, antero-posterior, oblique, and lateral fluoroscopic projection used confirm needle placement in all planes. Images permanently stored in EMR. Interpretation: No contrast injected. I personally interpreted the imaging intraoperatively. Adequate needle placement confirmed in multiple planes. Permanent images saved into the patient's record.  Antibiotic Prophylaxis:   Anti-infectives (From admission, onward)   None     Indication(s): None identified  Post-operative Assessment:  Post-procedure Vital Signs:  Pulse/HCG Rate: 7274 Temp: (!) 97.5 F (36.4 C) Resp: 14 BP: 140/81 SpO2: 100 %  EBL: None  Complications: No immediate post-treatment  complications observed by team, or reported by patient.  Note: The patient tolerated the entire procedure well. A repeat set of vitals were taken after the procedure and the patient was kept under observation following institutional policy, for this type of procedure. Post-procedural neurological assessment was performed, showing return to baseline, prior to discharge. The patient was provided with post-procedure discharge instructions, including a section on how to identify potential problems. Should any problems arise concerning this procedure, the patient was given instructions to immediately contact us, at any time, without hesitation. In any case, we plan to contact the patient by telephone for a follow-up status report regarding this interventional procedure.  Comments:  No additional relevant information.  Plan of Care  Orders:  Orders Placed This Encounter  Procedures  . CERVICAL FACET (MEDIAL BRANCH NERVE BLOCK)     Scheduling Instructions:     Side: Left-sided     Level: C3-4, C4-5, C5-6 Facet joints (C3, C4, C5, C6, & C7 Medial Branch Nerves)     Sedation: Patient's choice.     Timeframe: Today    Order Specific Question:   Where will this procedure be performed?    Answer:   [facility]  . [facility] C-ARM 1-60 MIN NO REPORT    Intraoperative interpretation by procedural physician at [place].    Standing Status:   Standing    Number of Occurrences:   1    Order Specific Question:   Reason for exam:    Answer:   Assistance in needle guidance and placement for procedures requiring needle placement in or near specific anatomical locations not easily accessible without such assistance.  . Informed Consent Details: Physician/Practitioner Attestation; Transcribe to consent form and obtain patient signature    Provider Attestation: I, Kirkman Dossie Arbour, MD, (Pain Management Specialist), the physician/practitioner, attest that I have discussed with the  patient the benefits, risks, side effects, alternatives, likelihood of  achieving goals and potential problems during recovery for the procedure that I have provided informed consent.    Scheduling Instructions:     Procedure: Left Cervical facet block under fluoroscopic guidance.     Indications: Chronic neck pain secondary to cervical facet syndrome     Note: Always confirm laterality of pain with Ms. Durene Fruits, before procedure.     Transcribe to consent form and obtain patient signature.  . Provide equipment / supplies at bedside    Equipment required: Single use, disposable, "Block Tray"    Standing Status:   Standing    Number of Occurrences:   1    Order Specific Question:   Specify    Answer:   Block Tray   Chronic Opioid Analgesic:  None from our practice.   Medications ordered for procedure: Meds ordered this encounter  Medications  . lidocaine (XYLOCAINE) 2 % (with pres) injection 400 mg  . lactated ringers infusion 1,000 mL  . midazolam (VERSED) 5 MG/5ML injection 1-2 mg    Make sure Flumazenil is available in the pyxis when using this medication. If oversedation occurs, administer 0.2 mg IV over 15 sec. If after 45 sec no response, administer 0.2 mg again over 1 min; may repeat at 1 min intervals; not to exceed 4 doses (1 mg)  . fentaNYL (SUBLIMAZE) injection 25-50 mcg    Make sure Narcan is available in the pyxis when using this medication. In the event of respiratory depression (RR< 8/min): Titrate NARCAN (naloxone) in increments of 0.1 to 0.2 mg IV at 2-3 minute intervals, until desired degree of reversal.  . ropivacaine (PF) 2 mg/mL (0.2%) (NAROPIN) injection 9 mL  . dexamethasone (DECADRON) injection 10 mg   Medications administered: We administered lidocaine, lactated ringers, midazolam, fentaNYL, ropivacaine (PF) 2 mg/mL (0.2%), and dexamethasone.  See the medical record for exact dosing, route, and time of administration.  Follow-up plan:   Return in about 2 weeks  (around 09/27/2019) for (VV), (PP) Follow-up, PM on Proc-day.       Considering:   Possible right-sided lumbar facet RFA#1 Possible right-sided sacroiliac joint RFA#1 Possibleleft-sided sacroiliac joint RFA#1   Palliative PRN treatment(s):   Palliativeright gluteal MNB Palliative right SI joint block#5 Palliative left SI joint block#3 Palliative right L4-5LESI#2 Palliative left-sided L4-5 LESI #2 Palliativeright lumbar facet block #4 Diagnosticleft lumbar facet block #2 Diagnostic right great toe intra-articular injection #2 Palliative rightIAsmall joint injection1stMTP(DorsalMetatarsophalangeal)#2     Recent Visits Date Type Provider Dept  09/05/19 Telemedicine Milinda Pointer, [facility]  08/22/19 Office Visit Milinda Pointer, MD [facility]  Showing recent visits within past 90 days and meeting all other requirements Today's Visits Date Type Provider Dept  09/13/19 Procedure visit Milinda Pointer, MD [facility]  Showing today's visits and meeting all other requirements Future Appointments Date Type Provider Dept  10/06/19 Appointment Milinda Pointer, MD [facility]  Showing future appointments within next 90 days and meeting all other requirements  Disposition: Discharge home  Discharge (Date  Time): 09/13/2019; 1017 hrs.   Primary Care Physician: [name], MD Location: [facility] Note by: Gaspar Cola, MD Date: 09/13/2019; Time: 10:34 AM  Disclaimer:  Medicine is not an exact science. The only guarantee in medicine is that nothing is guaranteed. It is important to note that the decision to proceed with this intervention was based on the information collected from the patient. The Data and conclusions were drawn from the patient's questionnaire, the  interview, and the physical examination. Because the information was provided in large part by the  patient, it cannot be guaranteed that it has not been purposely or unconsciously manipulated. Every effort has been made to obtain as much relevant data as possible for this evaluation. It is important to note that the conclusions that lead to this procedure are derived in large part from the available data. Always take into account that the treatment will also be dependent on availability of resources and existing treatment guidelines, considered by other Pain Management Practitioners as being common knowledge and practice, at the time of the intervention. For Medico-Legal purposes, it is also important to point out that variation in procedural techniques and pharmacological choices are the acceptable norm. The indications, contraindications, technique, and results of the above procedure should only be interpreted and judged by a Board-Certified Interventional Pain Specialist with extensive familiarity and expertise in the same exact procedure and technique.

## 2019-09-13 NOTE — Progress Notes (Signed)
Safety precautions to be maintained throughout the outpatient stay will include: orient to surroundings, keep bed in low position, maintain call bell within reach at all times, provide assistance with transfer out of bed and ambulation.  

## 2019-09-13 NOTE — Patient Instructions (Signed)

## 2019-09-14 ENCOUNTER — Telehealth: Payer: Self-pay

## 2019-09-14 NOTE — Telephone Encounter (Signed)
Post procedure phone call.  LM 

## 2019-09-15 ENCOUNTER — Ambulatory Visit: Payer: BC Managed Care – PPO | Admitting: Internal Medicine

## 2019-09-15 ENCOUNTER — Other Ambulatory Visit: Payer: Self-pay

## 2019-09-15 ENCOUNTER — Encounter: Payer: Self-pay | Admitting: Internal Medicine

## 2019-09-15 VITALS — BP 114/70 | HR 80 | Temp 98.2°F | Resp 16 | Wt 127.6 lb

## 2019-09-15 DIAGNOSIS — J3089 Other allergic rhinitis: Secondary | ICD-10-CM | POA: Diagnosis not present

## 2019-09-15 DIAGNOSIS — D649 Anemia, unspecified: Secondary | ICD-10-CM | POA: Diagnosis not present

## 2019-09-15 DIAGNOSIS — E2839 Other primary ovarian failure: Secondary | ICD-10-CM | POA: Diagnosis not present

## 2019-09-15 DIAGNOSIS — K219 Gastro-esophageal reflux disease without esophagitis: Secondary | ICD-10-CM | POA: Diagnosis not present

## 2019-09-15 DIAGNOSIS — F419 Anxiety disorder, unspecified: Secondary | ICD-10-CM

## 2019-09-15 DIAGNOSIS — M8580 Other specified disorders of bone density and structure, unspecified site: Secondary | ICD-10-CM

## 2019-09-15 DIAGNOSIS — Z13220 Encounter for screening for lipoid disorders: Secondary | ICD-10-CM

## 2019-09-15 DIAGNOSIS — J301 Allergic rhinitis due to pollen: Secondary | ICD-10-CM | POA: Diagnosis not present

## 2019-09-15 DIAGNOSIS — E559 Vitamin D deficiency, unspecified: Secondary | ICD-10-CM

## 2019-09-15 NOTE — Progress Notes (Signed)
Patient ID: Shannon [name], female   DOB: 11/30/1965, 54 y.o.   MRN: [id number]   Subjective:    Patient ID: Shannon [name], female    DOB: 20-Apr-1965, 54 y.o.   MRN: [id number]  HPI This visit occurred during the SARS-CoV-2 public health emergency.  Safety protocols were in place, including screening questions prior to the visit, additional usage of staff PPE, and extensive cleaning of exam room while observing appropriate contact time as indicated for disinfecting solutions.  Patient here for a scheduled follow up.  She is doing relatively well.  Being followed at pain clinic for chronic back/neck pain - trigger point injections - neck. Last evaluated 09/05/19.  Noted to have osteopenia on xray.  Taking calcium/magnesium.  Discussed calcium rich foods.  Trying to stay active.  No chest pain or sob.  No acid reflux reported.  No abdominal pain. Handling stress.    Past Medical History:  Diagnosis Date  . Anemia   . Anxiety   . Breast pain, right 11/07/2012  . Breathing difficulty 06/20/2014  . Environmental allergies   . GERD (gastroesophageal reflux disease)   . Hiatal hernia    small  . History of migraine headaches   . Hyperthyroidism    s/p ablation  . Vaginitis 01/16/2013   Past Surgical History:  Procedure Laterality Date  . BREAST BIOPSY Right 12/05/2015   neg  . CESAREAN SECTION  11/98   Dr Roena Malady  . CHOLECYSTECTOMY  11/05   Dr Pat Patrick  . JOINT REPLACEMENT Left 2017   hip  . NASAL SINUS SURGERY  11/93  . SEPTOPLASTY  5/90   Dr Rossie Muskrat  . TUBAL LIGATION  02/1998  . UMBILICAL HERNIA REPAIR  02/1998   Family History  Problem Relation Age of Onset  . Heart disease Father   . Hypertension Father   . Thyroid disease Father   . Hypercholesterolemia Father   . Kidney cancer Father   . Leukemia Father        hairy cell  . Hypercholesterolemia Mother   . Thyroid disease Mother   . Breast cancer Mother 53  . Breast cancer Other        maternal great grandmother  . Heart  disease Maternal Grandfather        MI - age 19  . Prostate cancer Paternal Grandfather   . Pancreatic cancer Paternal Grandfather   . Lung cancer Paternal Grandfather   . Bladder Cancer Neg Hx    Social History   Socioeconomic History  . Marital status: Married    Spouse name: Not on file  . Number of children: 1  . Years of education: Not on file  . Highest education level: Not on file  Occupational History  . Not on file  Tobacco Use  . Smoking status: Former Smoker    Quit date: 01/28/2000    Years since quitting: 19.6  . Smokeless tobacco: Never Used  Substance and Sexual Activity  . Alcohol use: No    Alcohol/week: 0.0 standard drinks  . Drug use: No  . Sexual activity: Not on file  Other Topics Concern  . Not on file  Social History Narrative  . Not on file   Social Determinants of Health   Financial Resource Strain:   . Difficulty of Paying Living Expenses: Not on file  Food Insecurity:   . Worried About Charity fundraiser in the Last Year: Not on file  . Ran Out of Food in  the Last Year: Not on file  Transportation Needs:   . Lack of Transportation (Medical): Not on file  . Lack of Transportation (Non-Medical): Not on file  Physical Activity:   . Days of Exercise per Week: Not on file  . Minutes of Exercise per Session: Not on file  Stress:   . Feeling of Stress : Not on file  Social Connections:   . Frequency of Communication with Friends and Family: Not on file  . Frequency of Social Gatherings with Friends and Family: Not on file  . Attends Religious Services: Not on file  . Active Member of Clubs or Organizations: Not on file  . Attends Archivist Meetings: Not on file  . Marital Status: Not on file    Outpatient Encounter Medications as of 09/15/2019  Medication Sig  . acetaminophen (TYLENOL) 500 MG tablet Take 1,000 mg by mouth every 6 (six) hours as needed for moderate pain.  Marland Kitchen ALPRAZolam (XANAX) 0.25 MG tablet TAKE ONE TABLET EVERY  DAY AS NEEDED FOR ANXIETY  . cholecalciferol (VITAMIN D3) 25 MCG (1000 UNIT) tablet Take 1,000 Units by mouth daily.  . Cyanocobalamin (VITAMIN B-12) 1000 MCG/15ML LIQD Take 1,000 mcg by mouth every 30 (thirty) days.  Marland Kitchen EPINEPHrine 0.3 mg/0.3 mL IJ SOAJ injection   . famotidine (PEPCID) 40 MG tablet Take by mouth.  Marland Kitchen omeprazole (PRILOSEC) 40 MG capsule Take 40 mg by mouth daily.  . ondansetron (ZOFRAN ODT) 4 MG disintegrating tablet Take 1 tablet (4 mg total) by mouth 2 (two) times daily as needed for nausea or vomiting.  . SUMAtriptan (IMITREX) 100 MG tablet    No facility-administered encounter medications on file as of 09/15/2019.    Review of Systems  Constitutional: Negative for appetite change and unexpected weight change.  HENT: Negative for congestion and sinus pressure.   Respiratory: Negative for cough, chest tightness and shortness of breath.   Cardiovascular: Negative for chest pain, palpitations and leg swelling.  Gastrointestinal: Negative for abdominal pain, diarrhea, nausea and vomiting.  Genitourinary: Negative for difficulty urinating and dysuria.  Musculoskeletal:       Chronic neck/back pain,   Skin: Negative for color change and rash.  Neurological: Negative for dizziness, light-headedness and headaches.  Psychiatric/Behavioral: Negative for agitation and dysphoric mood.       Objective:    Physical Exam Vitals reviewed.  Constitutional:      General: She is not in acute distress.    Appearance: Normal appearance.  HENT:     Head: Normocephalic and atraumatic.     Right Ear: External ear normal.     Left Ear: External ear normal.  Eyes:     General: No scleral icterus.       Right eye: No discharge.        Left eye: No discharge.     Conjunctiva/sclera: Conjunctivae normal.  Neck:     Thyroid: No thyromegaly.  Cardiovascular:     Rate and Rhythm: Normal rate and regular rhythm.  Pulmonary:     Effort: No respiratory distress.     Breath sounds:  Normal breath sounds. No wheezing.  Abdominal:     General: Bowel sounds are normal.     Palpations: Abdomen is soft.     Tenderness: There is no abdominal tenderness.  Musculoskeletal:        General: No swelling or tenderness.     Cervical back: Neck supple. No tenderness.  Lymphadenopathy:     Cervical: No cervical adenopathy.  Skin:    Findings: No erythema or rash.  Neurological:     Mental Status: She is alert.  Psychiatric:        Mood and Affect: Mood normal.        Behavior: Behavior normal.     BP 114/70   Pulse 80   Temp 98.2 F (36.8 C) (Oral)   Resp 16   Wt 127 lb 9.6 oz (57.9 kg)   LMP 02/12/2013   SpO2 98%   BMI 23.34 kg/m  Wt Readings from Last 3 Encounters:  09/15/19 127 lb 9.6 oz (57.9 kg)  09/13/19 124 lb (56.2 kg)  08/22/19 124 lb (56.2 kg)     Lab Results  Component Value Date   WBC 5.1 03/21/2019   HGB 13.5 03/21/2019   HCT 41.0 03/21/2019   PLT 242.0 03/21/2019   GLUCOSE 91 03/21/2019   CHOL 240 (H) 03/21/2019   TRIG 132.0 03/21/2019   HDL 90.00 03/21/2019   LDLCALC 123 (H) 03/21/2019   ALT 16 03/21/2019   AST 16 03/21/2019   NA 140 03/21/2019   K 3.7 03/21/2019   CL 103 03/21/2019   CREATININE 0.73 03/21/2019   BUN 10 03/21/2019   CO2 27 03/21/2019   TSH 1.87 03/21/2019    DG PAIN CLINIC C-ARM 1-60 MIN NO REPORT  Result Date: 09/13/2019 Fluoro was used, but no Radiologist interpretation will be provided. Please refer to "NOTES" tab for provider progress note.      Assessment & Plan:   Problem List Items Addressed This Visit    Vitamin D insufficiency    Check vitamin D level with next labs.        Relevant Orders   VITAMIN D 25 Hydroxy (Vit-D Deficiency, Fractures)   Osteopenia    Reported on xray.  Calcium rich foods - calcium/wieght bearing exercise.  Follow.  Schedule bone density.       Gastroesophageal reflux disease without esophagitis    No upper symptoms reported.  On prilosec.       Anxiety    Overall  appears to be handling stress well.  Takes xanax.       Anemia    Follow cbc.       Relevant Orders   CBC with Differential/Platelet   Comprehensive metabolic panel   TSH    Other Visit Diagnoses    Estrogen deficiency    -  Primary   Relevant Orders   DG Bone Density   Screening cholesterol level       Relevant Orders   Lipid panel       [name], MD

## 2019-09-20 DIAGNOSIS — J301 Allergic rhinitis due to pollen: Secondary | ICD-10-CM | POA: Diagnosis not present

## 2019-09-20 DIAGNOSIS — J3089 Other allergic rhinitis: Secondary | ICD-10-CM | POA: Diagnosis not present

## 2019-09-25 ENCOUNTER — Encounter: Payer: Self-pay | Admitting: Internal Medicine

## 2019-09-25 DIAGNOSIS — M8580 Other specified disorders of bone density and structure, unspecified site: Secondary | ICD-10-CM | POA: Insufficient documentation

## 2019-09-25 NOTE — Assessment & Plan Note (Signed)
Follow cbc.  

## 2019-09-25 NOTE — Assessment & Plan Note (Signed)
Check vitamin D level with next labs.  ?

## 2019-09-25 NOTE — Assessment & Plan Note (Signed)
Overall appears to be handling stress well.  Takes xanax.

## 2019-09-25 NOTE — Assessment & Plan Note (Addendum)
Reported on xray.  Calcium rich foods - calcium/wieght bearing exercise.  Follow.  Schedule bone density.

## 2019-09-25 NOTE — Assessment & Plan Note (Signed)
No upper symptoms reported. On prilosec.  

## 2019-09-27 DIAGNOSIS — J3089 Other allergic rhinitis: Secondary | ICD-10-CM | POA: Diagnosis not present

## 2019-09-27 DIAGNOSIS — J301 Allergic rhinitis due to pollen: Secondary | ICD-10-CM | POA: Diagnosis not present

## 2019-10-04 DIAGNOSIS — J301 Allergic rhinitis due to pollen: Secondary | ICD-10-CM | POA: Diagnosis not present

## 2019-10-04 DIAGNOSIS — J3089 Other allergic rhinitis: Secondary | ICD-10-CM | POA: Diagnosis not present

## 2019-10-05 ENCOUNTER — Telehealth: Payer: Self-pay

## 2019-10-05 ENCOUNTER — Encounter: Payer: Self-pay | Admitting: Pain Medicine

## 2019-10-05 NOTE — Telephone Encounter (Signed)
Voicemail left with patient re; VV on 10/06/19, asked to please call back so that we can go over procedures results and medications.

## 2019-10-05 NOTE — Telephone Encounter (Signed)
Left message for patient to call us back for pre virtual appointment questions.

## 2019-10-06 ENCOUNTER — Ambulatory Visit: Payer: BC Managed Care – PPO | Attending: Pain Medicine | Admitting: Pain Medicine

## 2019-10-06 ENCOUNTER — Other Ambulatory Visit: Payer: Self-pay

## 2019-10-06 DIAGNOSIS — M47812 Spondylosis without myelopathy or radiculopathy, cervical region: Secondary | ICD-10-CM | POA: Diagnosis not present

## 2019-10-06 DIAGNOSIS — M4310 Spondylolisthesis, site unspecified: Secondary | ICD-10-CM | POA: Diagnosis not present

## 2019-10-06 DIAGNOSIS — M542 Cervicalgia: Secondary | ICD-10-CM

## 2019-10-06 NOTE — Progress Notes (Signed)
Patient: Shannon [name]  Service Category: E/M  Provider: Gaspar Cola, MD  DOB: 1965/02/18  DOS: 10/06/2019  Location: Office  MRN: [id number]  Setting: Ambulatory outpatient  Referring Provider: [name], MD  Type: Established Patient  Specialty: Interventional Pain Management  PCP: [name], MD  Location: Remote location  Delivery: TeleHealth     Virtual Encounter - Pain Management PROVIDER NOTE: Information contained herein reflects review and annotations entered in association with encounter. Interpretation of such information and data should be left to medically-trained personnel. Information provided to patient can be located elsewhere in the medical record under "Patient Instructions". Document created using STT-dictation technology, any transcriptional errors that may result from process are unintentional.    Contact & Pharmacy Preferred: [phone] Home: [phone] (home) Mobile: [phone] (mobile) E-mail: [email or web address]  [place], [place] - [address] [place] [address] Phone: [phone] Fax: [phone]   Pre-screening  Shannon [name] offered "in-person" vs "virtual" encounter. She indicated preferring virtual for this encounter.   Reason COVID-19*  Social distancing based on CDC and AMA recommendations.   I contacted Shannon [name] on 10/06/2019 via telephone.      I clearly identified myself as Gaspar Cola, MD. I verified that I was speaking with the correct person using two identifiers (Name: Shannon [name], and date of birth: 06-10-1965).  Consent I sought verbal advanced consent from Shannon [name] for virtual visit interactions. I informed Shannon [name] of possible security and privacy concerns, risks, and limitations associated with providing "not-in-person" medical evaluation and management services. I also informed Shannon [name] of the availability of "in-person" appointments. Finally, I informed her  that there would be a charge for the virtual visit and that she could be  personally, fully or partially, financially responsible for it. Shannon [name] expressed understanding and agreed to proceed.   Historic Elements   Shannon [name] is a 54 y.o. year old, female patient evaluated today after our last contact on 09/13/2019. Shannon [name]  has a past medical history of Anemia, Anxiety, Breast pain, right (11/07/2012), Breathing difficulty (06/20/2014), Environmental allergies, GERD (gastroesophageal reflux disease), Hiatal hernia, History of migraine headaches, Hyperthyroidism, and Vaginitis (01/16/2013). She also  has a past surgical history that includes Septoplasty (5/90); Nasal sinus surgery (11/93); Cesarean section (11/98); Tubal ligation (02/1998); Umbilical hernia repair (02/1998); Cholecystectomy (11/05); Joint replacement (Left, 2017); and Breast biopsy (Right, 12/05/2015). Shannon [name] has a current medication list which includes the following prescription(s): acetaminophen, alprazolam, cholecalciferol, vitamin b-12, epinephrine, famotidine, omeprazole, ondansetron, and sumatriptan. She  reports that she quit smoking about 19 years ago. She has never used smokeless tobacco. She reports that she does not drink alcohol and does not use drugs. Shannon [name] is allergic to codeine, decongest-aid [pseudoephedrine], flagyl [metronidazole], and sulfa antibiotics.   HPI  Today, she is being contacted for a post-procedure assessment.  Post-Procedure Evaluation  Procedure (09/13/2019): Diagnostic left cervical facet block #1 under fluoroscopic guidance and IV sedation Pre-procedure pain level: 3/10 Post-procedure: 0/10 (100% relief)  Sedation: Sedation provided.  Effectiveness during initial hour after procedure(Ultra-Short Term Relief): 100 %.  Local anesthetic used: Long-acting (4-6 hours) Effectiveness: Defined as any analgesic benefit obtained secondary to the administration of local anesthetics. This carries  significant diagnostic value as to the etiological location, or anatomical origin, of the pain. Duration of benefit is expected to coincide with the duration of the local anesthetic used.  Effectiveness during initial 4-6 hours  after procedure(Short-Term Relief): 100 %.  Long-term benefit: Defined as any relief past the pharmacologic duration of the local anesthetics.  Effectiveness past the initial 6 hours after procedure(Long-Term Relief): 95 %.  Current benefits: Defined as benefit that persist at this time.   Analgesia:  >75% relief Function: Shannon [name] reports improvement in function ROM: Shannon [name] reports improvement in ROM  Pharmacotherapy Assessment  Analgesic: None from our practice.   Monitoring: [place] PMP: PDMP reviewed during this encounter.       Pharmacotherapy: No side-effects or adverse reactions reported. Compliance: No problems identified. Effectiveness: Clinically acceptable. Plan: Refer to "POC".  UDS: No results found for: SUMMARY  Laboratory Chemistry Profile   Renal Lab Results  Component Value Date   BUN 10 03/21/2019   CREATININE 0.73 03/21/2019   GFR 82.61 03/21/2019   GFRAA >60 05/02/2014   GFRNONAA >60 05/02/2014     Hepatic Lab Results  Component Value Date   AST 16 03/21/2019   ALT 16 03/21/2019   ALBUMIN 4.4 03/21/2019   ALKPHOS 63 03/21/2019     Electrolytes Lab Results  Component Value Date   NA 140 03/21/2019   K 3.7 03/21/2019   CL 103 03/21/2019   CALCIUM 9.9 03/21/2019     Bone Lab Results  Component Value Date   VD25OH 19.29 (L) 03/21/2019   25OHVITD1 28 (L) 10/05/2017   25OHVITD2 <1.0 10/05/2017   25OHVITD3 28 10/05/2017     Inflammation (CRP: Acute Phase) (ESR: Chronic Phase) Lab Results  Component Value Date   CRP 2 10/05/2017   ESRSEDRATE 7 10/05/2017       Note: Above Lab results reviewed.  Imaging  DG PAIN CLINIC C-ARM 1-60 MIN NO REPORT Fluoro was used, but no Radiologist interpretation will be provided.   Please refer to "NOTES" tab for provider progress note.  Assessment  The primary encounter diagnosis was Cervicalgia. Diagnoses of Cervical facet syndrome (Bilateral), Grade 1 Anterolisthesis of C5/C6, and Cervical facet arthropathy (Bilateral) (L>R) were also pertinent to this visit.  Plan of Care  Problem-specific:  No problem-specific Assessment & Plan notes found for this encounter.  Shannon [name] has a current medication list which includes the following long-term medication(s): famotidine and sumatriptan.  Pharmacotherapy (Medications Ordered): No orders of the defined types were placed in this encounter.  Orders:  No orders of the defined types were placed in this encounter.  Follow-up plan:   No follow-ups on file.      Considering:   Possible right lumbar facet RFA#1 Possible right SI joint RFA#1 Possibleleft SI joint RFA#1 Therapeutic left cervical facet RFA #1    Palliative PRN treatment(s):   Palliativeright gluteal MNB Palliative right SI joint block#5 Palliative left SI joint block#3 Palliative right L4-5LESI#2 Palliative left L4-5 LESI #2 Palliativeright lumbar facet block #4 Diagnosticleft lumbar facet block #2 Diagnostic right great toe IA injection #2 Palliative rightIAsmall joint injection1stMTP(DorsalMetatarsophalangeal)#2 Diagnostic left cervical facet block #2     Recent Visits Date Type Provider Dept  09/13/19 Procedure visit Milinda Pointer, MD [facility]  09/05/19 Telemedicine Milinda Pointer, [facility]  08/22/19 Office Visit Milinda Pointer, MD [facility]  Showing recent visits within past 90 days and meeting all other requirements Today's Visits Date Type Provider Dept  10/06/19 Telemedicine Milinda Pointer, MD [facility]  Showing today's visits and meeting all other requirements Future Appointments No visits were found meeting these  conditions. Showing future appointments within next 90 days and  meeting all other requirements  I discussed the assessment and treatment plan with the patient. The patient was provided an opportunity to ask questions and all were answered. The patient agreed with the plan and demonstrated an understanding of the instructions.  Patient advised to call back or seek an in-person evaluation if the symptoms or condition worsens.  Duration of encounter: 12 minutes.  Note by: Gaspar Cola, MD Date: 10/06/2019; Time: 4:20 PM

## 2019-10-11 ENCOUNTER — Other Ambulatory Visit: Payer: Self-pay

## 2019-10-11 ENCOUNTER — Ambulatory Visit (INDEPENDENT_AMBULATORY_CARE_PROVIDER_SITE_OTHER): Payer: BC Managed Care – PPO

## 2019-10-11 DIAGNOSIS — E538 Deficiency of other specified B group vitamins: Secondary | ICD-10-CM

## 2019-10-11 DIAGNOSIS — J3089 Other allergic rhinitis: Secondary | ICD-10-CM | POA: Diagnosis not present

## 2019-10-11 DIAGNOSIS — J301 Allergic rhinitis due to pollen: Secondary | ICD-10-CM | POA: Diagnosis not present

## 2019-10-11 MED ORDER — CYANOCOBALAMIN 1000 MCG/ML IJ SOLN
1000.0000 ug | Freq: Once | INTRAMUSCULAR | Status: AC
  Administered 2019-10-11: 1000 ug via INTRAMUSCULAR

## 2019-10-11 NOTE — Progress Notes (Addendum)
Patient presented for B 12 injection to left deltoid, patient voiced no concerns nor showed any signs of distress during injection.  Reviewed.  Dr Scott 

## 2019-10-17 ENCOUNTER — Other Ambulatory Visit: Payer: Self-pay | Admitting: Internal Medicine

## 2019-10-17 DIAGNOSIS — Z1231 Encounter for screening mammogram for malignant neoplasm of breast: Secondary | ICD-10-CM

## 2019-10-18 DIAGNOSIS — J301 Allergic rhinitis due to pollen: Secondary | ICD-10-CM | POA: Diagnosis not present

## 2019-10-18 DIAGNOSIS — J3089 Other allergic rhinitis: Secondary | ICD-10-CM | POA: Diagnosis not present

## 2019-10-25 DIAGNOSIS — J301 Allergic rhinitis due to pollen: Secondary | ICD-10-CM | POA: Diagnosis not present

## 2019-10-25 DIAGNOSIS — J3089 Other allergic rhinitis: Secondary | ICD-10-CM | POA: Diagnosis not present

## 2019-10-31 ENCOUNTER — Ambulatory Visit (INDEPENDENT_AMBULATORY_CARE_PROVIDER_SITE_OTHER): Payer: BC Managed Care – PPO

## 2019-10-31 ENCOUNTER — Other Ambulatory Visit: Payer: Self-pay

## 2019-10-31 DIAGNOSIS — Z23 Encounter for immunization: Secondary | ICD-10-CM

## 2019-11-01 DIAGNOSIS — J3089 Other allergic rhinitis: Secondary | ICD-10-CM | POA: Diagnosis not present

## 2019-11-01 DIAGNOSIS — J301 Allergic rhinitis due to pollen: Secondary | ICD-10-CM | POA: Diagnosis not present

## 2019-11-08 DIAGNOSIS — J301 Allergic rhinitis due to pollen: Secondary | ICD-10-CM | POA: Diagnosis not present

## 2019-11-08 DIAGNOSIS — J3089 Other allergic rhinitis: Secondary | ICD-10-CM | POA: Diagnosis not present

## 2019-11-14 ENCOUNTER — Other Ambulatory Visit: Payer: Self-pay | Admitting: Internal Medicine

## 2019-11-15 ENCOUNTER — Ambulatory Visit (INDEPENDENT_AMBULATORY_CARE_PROVIDER_SITE_OTHER): Payer: BC Managed Care – PPO

## 2019-11-15 ENCOUNTER — Other Ambulatory Visit: Payer: Self-pay

## 2019-11-15 DIAGNOSIS — J3089 Other allergic rhinitis: Secondary | ICD-10-CM | POA: Diagnosis not present

## 2019-11-15 DIAGNOSIS — E538 Deficiency of other specified B group vitamins: Secondary | ICD-10-CM | POA: Diagnosis not present

## 2019-11-15 DIAGNOSIS — J301 Allergic rhinitis due to pollen: Secondary | ICD-10-CM | POA: Diagnosis not present

## 2019-11-15 DIAGNOSIS — J3081 Allergic rhinitis due to animal (cat) (dog) hair and dander: Secondary | ICD-10-CM | POA: Diagnosis not present

## 2019-11-15 MED ORDER — CYANOCOBALAMIN 1000 MCG/ML IJ SOLN
1000.0000 ug | Freq: Once | INTRAMUSCULAR | Status: AC
  Administered 2019-11-15: 1000 ug via INTRAMUSCULAR

## 2019-11-15 NOTE — Progress Notes (Addendum)
Patient presented for B 12 injection to right deltoid, patient voiced no concerns nor showed any signs of distress during injection.  Reviewed.  Dr Scott 

## 2019-11-16 NOTE — Telephone Encounter (Signed)
rx ok'd for xanax #30 with one refill  

## 2019-11-22 DIAGNOSIS — J3089 Other allergic rhinitis: Secondary | ICD-10-CM | POA: Diagnosis not present

## 2019-11-22 DIAGNOSIS — J301 Allergic rhinitis due to pollen: Secondary | ICD-10-CM | POA: Diagnosis not present

## 2019-11-24 ENCOUNTER — Other Ambulatory Visit: Payer: Self-pay

## 2019-11-24 ENCOUNTER — Ambulatory Visit (INDEPENDENT_AMBULATORY_CARE_PROVIDER_SITE_OTHER): Payer: BC Managed Care – PPO | Admitting: Podiatry

## 2019-11-24 ENCOUNTER — Encounter: Payer: Self-pay | Admitting: Podiatry

## 2019-11-24 DIAGNOSIS — M7751 Other enthesopathy of right foot: Secondary | ICD-10-CM | POA: Diagnosis not present

## 2019-11-24 DIAGNOSIS — M2021 Hallux rigidus, right foot: Secondary | ICD-10-CM

## 2019-11-24 DIAGNOSIS — K219 Gastro-esophageal reflux disease without esophagitis: Secondary | ICD-10-CM | POA: Diagnosis not present

## 2019-11-24 DIAGNOSIS — Q666 Other congenital valgus deformities of feet: Secondary | ICD-10-CM | POA: Diagnosis not present

## 2019-11-24 DIAGNOSIS — Z79899 Other long term (current) drug therapy: Secondary | ICD-10-CM | POA: Diagnosis not present

## 2019-11-24 NOTE — Progress Notes (Signed)
Subjective:  Patient ID: Shannon [name], female    DOB: 1965/08/25,  MRN: [id number]  Chief Complaint  Patient presents with  . Toe Pain    Patient presents today for pain in right hallux again which started about 3-4 week ago.  She says it burns and aches in the joint and reqeust an injection    54 y.o. female presents with the above complaint.  Patient presents with complaint of right first metatarsophalangeal joint pain.  Patient states to start about 3 to 4 weeks ago.  Patient was seen in the past with Dr. Milinda Pointer who was treating it.  She also had an injection received in the past which gave her about almost 2 years of relief.  She would like to know if she can get another steroid injection as a starting her back up again.  She denies any other acute complaints.  She denies any other acute complaints   Review of Systems: Negative except as noted in the HPI. Denies N/V/F/Ch.  Past Medical History:  Diagnosis Date  . Anemia   . Anxiety   . Breast pain, right 11/07/2012  . Breathing difficulty 06/20/2014  . Environmental allergies   . GERD (gastroesophageal reflux disease)   . Hiatal hernia    small  . History of migraine headaches   . Hyperthyroidism    s/p ablation  . Vaginitis 01/16/2013    Current Outpatient Medications:  .  acetaminophen (TYLENOL) 500 MG tablet, Take 1,000 mg by mouth every 6 (six) hours as needed for moderate pain., Disp: , Rfl:  .  ALPRAZolam (XANAX) 0.25 MG tablet, TAKE ONE TABLET EVERY DAY AS NEEDED FOR ANXIETY, Disp: 30 tablet, Rfl: 1 .  cholecalciferol (VITAMIN D3) 25 MCG (1000 UNIT) tablet, Take 1,000 Units by mouth daily., Disp: , Rfl:  .  Cyanocobalamin (VITAMIN B-12) 1000 MCG/15ML LIQD, Take 1,000 mcg by mouth every 30 (thirty) days., Disp: , Rfl:  .  EPINEPHrine 0.3 mg/0.3 mL IJ SOAJ injection, , Disp: , Rfl:  .  famotidine (PEPCID) 40 MG tablet, Take by mouth., Disp: , Rfl:  .  omeprazole (PRILOSEC) 40 MG capsule, Take 40 mg by mouth daily.,  Disp: , Rfl:  .  ondansetron (ZOFRAN ODT) 4 MG disintegrating tablet, Take 1 tablet (4 mg total) by mouth 2 (two) times daily as needed for nausea or vomiting., Disp: 10 tablet, Rfl: 0 .  SUMAtriptan (IMITREX) 100 MG tablet, , Disp: , Rfl:   Social History   Tobacco Use  Smoking Status Former Smoker  . Quit date: 01/28/2000  . Years since quitting: 19.8  Smokeless Tobacco Never Used    Allergies  Allergen Reactions  . Codeine Other (See Comments)    Severe Headache  . Decongest-Aid [Pseudoephedrine] Other (See Comments)    Increased heart rate  . Flagyl [Metronidazole]   . Sulfa Antibiotics    Objective:  There were no vitals filed for this visit. There is no height or weight on file to calculate BMI. Constitutional Well developed. Well nourished.  Vascular Dorsalis pedis pulses palpable bilaterally. Posterior tibial pulses palpable bilaterally. Capillary refill normal to all digits.  No cyanosis or clubbing noted. Pedal hair growth normal.  Neurologic Normal speech. Oriented to person, place, and time. Epicritic sensation to light touch grossly present bilaterally.  Dermatologic Nails well groomed and normal in appearance. No open wounds. No skin lesions.  Orthopedic:  Pain on palpation of the right first metatarsophalangeal joint.  Pain with range of motion of the  first metatarsophalangeal joint.  Intra-articular pain noted.  No pain at the sesamoidal complex.  No extensor or flexor tendinitis noted.   Radiographs: None Assessment:   1. Capsulitis of metatarsophalangeal (MTP) joint of right foot   2. Hallux rigidus of right foot    Plan:  Patient was evaluated and treated and all questions answered.  Right first MPJ capsulitis with underlying osteoarthritis -I explained to patient the etiology of capsulitis and various treatment options were extensively discussed.  I discussed with her that as long as a steroid injections are helping I do not mind doing them every  couple of months.  If there is ever decreasing frequency needed for steroid injection we will discuss surgical treatment options at that time.  Patient agrees with the plan like to proceed with a steroid injection -A steroid injection was performed at right first MTP osteoarthritis using 1% plain Lidocaine and 10 mg of Kenalog. This was well tolerated.   Right hallux rigidus with underlying pes planovalgus semiflexible -I explained patient the etiology of hallux rigidus and various treatment options were extensively discussed.  I believe patient will benefit from custom-made orthotics to help control the hindfoot motion and support the arch of the foot with an incorporation of Morton's extension to take the stress off of the right first metatarsophalangeal joint. -She will be scheduled to see Liliane Channel for custom-made orthotics with Morton's extension to the right  No follow-ups on file.

## 2019-11-28 DIAGNOSIS — J301 Allergic rhinitis due to pollen: Secondary | ICD-10-CM | POA: Diagnosis not present

## 2019-11-29 DIAGNOSIS — J3081 Allergic rhinitis due to animal (cat) (dog) hair and dander: Secondary | ICD-10-CM | POA: Diagnosis not present

## 2019-11-29 DIAGNOSIS — J3089 Other allergic rhinitis: Secondary | ICD-10-CM | POA: Diagnosis not present

## 2019-11-29 DIAGNOSIS — J301 Allergic rhinitis due to pollen: Secondary | ICD-10-CM | POA: Diagnosis not present

## 2019-12-06 DIAGNOSIS — J3089 Other allergic rhinitis: Secondary | ICD-10-CM | POA: Diagnosis not present

## 2019-12-06 DIAGNOSIS — J301 Allergic rhinitis due to pollen: Secondary | ICD-10-CM | POA: Diagnosis not present

## 2019-12-06 DIAGNOSIS — J3081 Allergic rhinitis due to animal (cat) (dog) hair and dander: Secondary | ICD-10-CM | POA: Diagnosis not present

## 2019-12-08 ENCOUNTER — Other Ambulatory Visit: Payer: Self-pay

## 2019-12-08 ENCOUNTER — Ambulatory Visit
Admission: RE | Admit: 2019-12-08 | Discharge: 2019-12-08 | Disposition: A | Discharge status: Home / Self Care | Payer: BC Managed Care – PPO | Source: Ambulatory Visit | Attending: Internal Medicine | Admitting: Internal Medicine

## 2019-12-08 DIAGNOSIS — Z1231 Encounter for screening mammogram for malignant neoplasm of breast: Secondary | ICD-10-CM

## 2019-12-08 DIAGNOSIS — E2839 Other primary ovarian failure: Secondary | ICD-10-CM | POA: Diagnosis not present

## 2019-12-08 DIAGNOSIS — Z780 Asymptomatic menopausal state: Secondary | ICD-10-CM | POA: Diagnosis not present

## 2019-12-08 DIAGNOSIS — M8589 Other specified disorders of bone density and structure, multiple sites: Secondary | ICD-10-CM | POA: Diagnosis not present

## 2019-12-13 DIAGNOSIS — J301 Allergic rhinitis due to pollen: Secondary | ICD-10-CM | POA: Diagnosis not present

## 2019-12-13 DIAGNOSIS — J3089 Other allergic rhinitis: Secondary | ICD-10-CM | POA: Diagnosis not present

## 2019-12-20 ENCOUNTER — Ambulatory Visit: Payer: BC Managed Care – PPO

## 2019-12-20 DIAGNOSIS — J019 Acute sinusitis, unspecified: Secondary | ICD-10-CM | POA: Diagnosis not present

## 2019-12-20 DIAGNOSIS — Z03818 Encounter for observation for suspected exposure to other biological agents ruled out: Secondary | ICD-10-CM | POA: Diagnosis not present

## 2019-12-20 DIAGNOSIS — H6983 Other specified disorders of Eustachian tube, bilateral: Secondary | ICD-10-CM | POA: Diagnosis not present

## 2019-12-27 DIAGNOSIS — J301 Allergic rhinitis due to pollen: Secondary | ICD-10-CM | POA: Diagnosis not present

## 2019-12-27 DIAGNOSIS — J3089 Other allergic rhinitis: Secondary | ICD-10-CM | POA: Diagnosis not present

## 2019-12-28 ENCOUNTER — Other Ambulatory Visit: Payer: Self-pay

## 2019-12-28 ENCOUNTER — Ambulatory Visit (INDEPENDENT_AMBULATORY_CARE_PROVIDER_SITE_OTHER): Payer: BC Managed Care – PPO | Admitting: Orthotics

## 2019-12-28 DIAGNOSIS — M2021 Hallux rigidus, right foot: Secondary | ICD-10-CM | POA: Diagnosis not present

## 2019-12-28 DIAGNOSIS — Q666 Other congenital valgus deformities of feet: Secondary | ICD-10-CM

## 2019-12-28 NOTE — Progress Notes (Signed)
Patient is here today to be evaluated and cast for CMFO.  Patient has hx of functional hallux limitus (FHL), and needs a supportive orthoses that will plantarflex first ray in order to lower hinge pin of first MPJ and enhance windless effect.  Plan of deep heel cup, hug arch, and morton's extenstion RIGHT

## 2020-01-03 DIAGNOSIS — J301 Allergic rhinitis due to pollen: Secondary | ICD-10-CM | POA: Diagnosis not present

## 2020-01-03 DIAGNOSIS — J3089 Other allergic rhinitis: Secondary | ICD-10-CM | POA: Diagnosis not present

## 2020-01-03 DIAGNOSIS — J3081 Allergic rhinitis due to animal (cat) (dog) hair and dander: Secondary | ICD-10-CM | POA: Diagnosis not present

## 2020-01-05 ENCOUNTER — Ambulatory Visit (INDEPENDENT_AMBULATORY_CARE_PROVIDER_SITE_OTHER): Payer: BC Managed Care – PPO

## 2020-01-05 ENCOUNTER — Other Ambulatory Visit: Payer: Self-pay

## 2020-01-05 DIAGNOSIS — E538 Deficiency of other specified B group vitamins: Secondary | ICD-10-CM | POA: Diagnosis not present

## 2020-01-05 MED ORDER — CYANOCOBALAMIN 1000 MCG/ML IJ SOLN
1000.0000 ug | Freq: Once | INTRAMUSCULAR | Status: AC
  Administered 2020-01-05: 1000 ug via INTRAMUSCULAR

## 2020-01-05 NOTE — Progress Notes (Signed)
Patient presented for B 12 injection to left deltoid, patient voiced no concerns nor showed any signs of distress during injection. 

## 2020-01-10 DIAGNOSIS — J301 Allergic rhinitis due to pollen: Secondary | ICD-10-CM | POA: Diagnosis not present

## 2020-01-10 DIAGNOSIS — J3089 Other allergic rhinitis: Secondary | ICD-10-CM | POA: Diagnosis not present

## 2020-01-16 ENCOUNTER — Other Ambulatory Visit: Payer: Self-pay | Admitting: Internal Medicine

## 2020-01-17 DIAGNOSIS — J3089 Other allergic rhinitis: Secondary | ICD-10-CM | POA: Diagnosis not present

## 2020-01-17 DIAGNOSIS — J3081 Allergic rhinitis due to animal (cat) (dog) hair and dander: Secondary | ICD-10-CM | POA: Diagnosis not present

## 2020-01-17 DIAGNOSIS — J301 Allergic rhinitis due to pollen: Secondary | ICD-10-CM | POA: Diagnosis not present

## 2020-01-17 NOTE — Telephone Encounter (Signed)
rx ok'd for xanax #30 with one refill  

## 2020-01-18 ENCOUNTER — Encounter: Payer: BC Managed Care – PPO | Admitting: Orthotics

## 2020-01-24 DIAGNOSIS — J3089 Other allergic rhinitis: Secondary | ICD-10-CM | POA: Diagnosis not present

## 2020-01-24 DIAGNOSIS — J301 Allergic rhinitis due to pollen: Secondary | ICD-10-CM | POA: Diagnosis not present

## 2020-01-26 ENCOUNTER — Ambulatory Visit
Admission: EM | Admit: 2020-01-26 | Discharge: 2020-01-26 | Disposition: A | Discharge status: Home / Self Care | Payer: BC Managed Care – PPO | Attending: Family Medicine | Admitting: Family Medicine

## 2020-01-26 DIAGNOSIS — R350 Frequency of micturition: Secondary | ICD-10-CM | POA: Diagnosis not present

## 2020-01-26 LAB — POCT URINALYSIS DIP (MANUAL ENTRY)
Bilirubin, UA: NEGATIVE
Glucose, UA: NEGATIVE mg/dL
Ketones, POC UA: NEGATIVE mg/dL
Nitrite, UA: NEGATIVE
Protein Ur, POC: NEGATIVE mg/dL
Spec Grav, UA: 1.01 (ref 1.010–1.025)
Urobilinogen, UA: 0.2 E.U./dL
pH, UA: 5.5 (ref 5.0–8.0)

## 2020-01-26 MED ORDER — CEPHALEXIN 500 MG PO CAPS: 500.0000 mg | ORAL_CAPSULE | Freq: Two times a day (BID) | ORAL | 0 refills | Status: AC

## 2020-01-26 NOTE — ED Triage Notes (Signed)
Pt reports having some urinary frequency that began on Monday and some mild burning. sts she have taken AZO on Monday and Tuesday.

## 2020-01-26 NOTE — ED Provider Notes (Signed)
Shannon [name]    CSN: [id number] Arrival date & time: 01/26/20  0844      History   Chief Complaint Chief Complaint  Patient presents with   Urinary Frequency    HPI Shannon [name] is a 54 y.o. female.   Patient is a 54 year old female who presents today with some urinary frequency that began on Monday.  She has had some mild dysuria.  Taking AZO on Monday and Tuesday with some relief.  No back pain, flank pain or fevers.     Past Medical History:  Diagnosis Date   Anemia    Anxiety    Breast pain, right 11/07/2012   Breathing difficulty 06/20/2014   Environmental allergies    GERD (gastroesophageal reflux disease)    Hiatal hernia    small   History of migraine headaches    Hyperthyroidism    s/p ablation   Vaginitis 01/16/2013    Patient Active Problem List   Diagnosis Date Noted   Osteopenia 09/25/2019   DDD (degenerative disc disease), cervical (C5-6, C6-7) 09/05/2019   Grade 1 Anterolisthesis of C5/C6 09/05/2019   Cervical facet arthropathy (Bilateral) (L>R) 09/05/2019   Cervical facet syndrome (Bilateral) 09/05/2019   Cervicalgia 08/22/2019   Cervical myofascial pain syndrome (Left) 08/22/2019   Occipital neuralgia (Left) 08/22/2019   Nipple tenderness 07/03/2019   Itching 04/24/2019   Right calf pain 03/13/2019   Axillary fullness 03/13/2019   COVID-19 virus infection 01/16/2019   Urinary frequency 12/26/2018   Exposure to COVID-19 virus 11/21/2018   Cerumen impaction 09/11/2018   Preoperative testing 07/05/2018   Vaginal discharge 05/30/2018   DDD (degenerative disc disease), lumbosacral 04/13/2018   Vitamin D insufficiency 11/17/2017   Osteoarthritis of great toe joint (Right) 10/15/2017   Chronic toe pain (Right) 10/05/2017   Great toe pain (Right) 10/05/2017   Disorder of skeletal system 10/05/2017   Pharmacologic therapy 10/05/2017   Problems influencing health status 10/05/2017   Lumbar  Facet syndrome (Bilateral) (R>L) 07/20/2017   Spondylosis without myelopathy or radiculopathy, lumbosacral region 07/20/2017   Urinary urgency 06/28/2017   Chronic pain syndrome 06/15/2017   Other specified dorsopathies, sacral and sacrococcygeal region 06/15/2017   Chronic myofascial pain syndrome (Right Gluteous Muscle) 03/03/2016   Chronic sacroiliac joint pain (Bilateral) (R>L) 01/14/2016   History of hip replacement (Left) 11/12/2015   Osteoarthritis of hip (Left) 11/12/2015   Osteoarthritis of sacroiliac joint (Bilateral) (R>L) 07/12/2015   Axillary lump 05/22/2015   Post-menopausal bleeding 02/20/2015   Chronic low back pain (1ry area of Pain) (Bilateral) (R>L) 12/16/2014   Lumbar lateral recess stenosis (L4-5) (Left) 12/16/2014   Lumbar facet hypertrophy 12/16/2014   Chronic lumbar radicular pain (intermittent) (L4 Dermatome) (Left) 12/16/2014   Chronic hip pain (resolved after hip replacement) (Left) 12/16/2014   Nicotine dependence 12/16/2014   Tobacco abuse 12/16/2014   Lumbar spondylosis 12/16/2014   Encounter for chronic pain management 12/16/2014   Gastroesophageal reflux disease without esophagitis 10/18/2014   Anxiety 06/20/2014   Health care maintenance 04/02/2014   Menopausal symptoms 11/07/2012   Migraine 05/21/2012   Anemia 02/01/2012   Hyperthyroidism 01/30/2012   GERD (gastroesophageal reflux disease) 01/30/2012    Past Surgical History:  Procedure Laterality Date   BREAST BIOPSY Right 12/05/2015   neg   CESAREAN SECTION  11/98   Dr Roena Malady   CHOLECYSTECTOMY  11/05   Dr Pat Patrick   JOINT REPLACEMENT Left 2017   hip   NASAL SINUS SURGERY  11/93   SEPTOPLASTY  5/90   Dr Rossie Muskrat   TUBAL LIGATION  123456   UMBILICAL HERNIA REPAIR  02/1998    OB History   No obstetric history on file.      Home Medications    Prior to Admission medications   Medication Sig Start Date End Date Taking? Authorizing Provider   cephALEXin (KEFLEX) 500 MG capsule Take 1 capsule (500 mg total) by mouth 2 (two) times daily for 5 days. 01/26/20 01/31/20 Yes Lagina Reader A, NP  acetaminophen (TYLENOL) 500 MG tablet Take 1,000 mg by mouth every 6 (six) hours as needed for moderate pain.    [provider]  ALPRAZolam Duanne Moron) 0.25 MG tablet TAKE ONE TABLET DAILY IF NEEDED FOR ANXIETY 01/17/20   [name], MD  cholecalciferol (VITAMIN D3) 25 MCG (1000 UNIT) tablet Take 1,000 Units by mouth daily.    [provider]  Cyanocobalamin (VITAMIN B-12) 1000 MCG/15ML LIQD Take 1,000 mcg by mouth every 30 (thirty) days.    [provider]  EPINEPHrine 0.3 mg/0.3 mL IJ SOAJ injection  01/14/18   [provider]  famotidine (PEPCID) 40 MG tablet Take by mouth.    [provider]  omeprazole (PRILOSEC) 40 MG capsule Take 40 mg by mouth daily.    [provider]  ondansetron (ZOFRAN ODT) 4 MG disintegrating tablet Take 1 tablet (4 mg total) by mouth 2 (two) times daily as needed for nausea or vomiting. 06/28/19   [name], MD  SUMAtriptan (IMITREX) 100 MG tablet  02/15/18   [provider]    Family History Family History  Problem Relation Age of Onset   Heart disease Father    Hypertension Father    Thyroid disease Father    Hypercholesterolemia Father    Kidney cancer Father    Leukemia Father        hairy cell   Hypercholesterolemia Mother    Thyroid disease Mother    Breast cancer Mother 71   Breast cancer Other        maternal great grandmother   Heart disease Maternal Grandfather        MI - age 76   Prostate cancer Paternal Grandfather    Pancreatic cancer Paternal Grandfather    Lung cancer Paternal Grandfather    Bladder Cancer Neg Hx     Social History Social History   Tobacco Use   Smoking status: Former Smoker    Quit date: 01/28/2000    Years since quitting: 20.0   Smokeless tobacco: Never Used  Substance Use Topics    Alcohol use: No    Alcohol/week: 0.0 standard drinks   Drug use: No     Allergies   Codeine, Decongest-aid [pseudoephedrine], Flagyl [metronidazole], and Sulfa antibiotics   Review of Systems Review of Systems   Physical Exam Triage Vital Signs ED Triage Vitals  Enc Vitals Group     BP 01/26/20 0946 (!) 143/73     Pulse Rate 01/26/20 0946 80     Resp 01/26/20 0946 18     Temp 01/26/20 0946 98.3 F (36.8 C)     Temp Source 01/26/20 0946 Oral     SpO2 01/26/20 0946 98 %     Weight 01/26/20 0947 125 lb (56.7 kg)     Height 01/26/20 0947 5\' 2"  (1.575 m)     Head Circumference --      Peak Flow --      Pain Score 01/26/20 0947 2     Pain Loc --  Pain Edu? --      Excl. in GC? --    No data found.  Updated Vital Signs BP (!) 143/73    Pulse 80    Temp 98.3 F (36.8 C) (Oral)    Resp 18    Ht 5\' 2"  (1.575 m)    Wt 125 lb (56.7 kg)    LMP 02/12/2013    SpO2 98%    BMI 22.86 kg/m   Visual Acuity Right Eye Distance:   Left Eye Distance:   Bilateral Distance:    Right Eye Near:   Left Eye Near:    Bilateral Near:     Physical Exam Vitals and nursing note reviewed.  Constitutional:      General: She is not in acute distress.    Appearance: Normal appearance. She is not ill-appearing, toxic-appearing or diaphoretic.  HENT:     Head: Normocephalic.     Nose: Nose normal.  Eyes:     Conjunctiva/sclera: Conjunctivae normal.  Pulmonary:     Effort: Pulmonary effort is normal.  Musculoskeletal:        General: Normal range of motion.     Cervical back: Normal range of motion.  Skin:    General: Skin is warm and dry.     Findings: No rash.  Neurological:     Mental Status: She is alert.  Psychiatric:        Mood and Affect: Mood normal.      UC Treatments / Results  Labs (all labs ordered are listed, but only abnormal results are displayed) Labs Reviewed  POCT URINALYSIS DIP (MANUAL ENTRY) - Abnormal; Notable for the following components:      Result  Value   Clarity, UA cloudy (*)    Blood, UA trace-intact (*)    Leukocytes, UA Moderate (2+) (*)    All other components within normal limits  URINE CULTURE    EKG   Radiology No results found.  Procedures Procedures (including critical care time)  Medications Ordered in UC Medications - No data to display  Initial Impression / Assessment and Plan / UC Course  I have reviewed the triage vital signs and the nursing notes.  Pertinent labs & imaging results that were available during my care of the patient were reviewed by me and considered in my medical decision making (see chart for details).     Urinary frequency Patient urine with moderate leuks and trace blood.  Sending for culture.  Most likely urinary tract infection.  Treating with Keflex Push fluids AZO as needed Follow up as needed for continued or worsening symptoms  Final Clinical Impressions(s) / UC Diagnoses   Final diagnoses:  Urinary frequency     Discharge Instructions     Treating you for urinary tract infection.  Take the medication as prescribed.  Make sure drinking plenty of fluids.  AZO as needed.    ED Prescriptions    Medication Sig Dispense Auth. Provider   cephALEXin (KEFLEX) 500 MG capsule Take 1 capsule (500 mg total) by mouth 2 (two) times daily for 5 days. 10 capsule 02/14/2013 A, NP     PDMP not reviewed this encounter.   Dahlia Byes, NP 01/26/20 1004

## 2020-01-26 NOTE — Discharge Instructions (Addendum)
Treating you for urinary tract infection.  Take the medication as prescribed.  Make sure drinking plenty of fluids.  AZO as needed.

## 2020-01-28 LAB — URINE CULTURE: Culture: 60000 — AB

## 2020-01-30 DIAGNOSIS — G43019 Migraine without aura, intractable, without status migrainosus: Secondary | ICD-10-CM | POA: Diagnosis not present

## 2020-01-30 DIAGNOSIS — G43839 Menstrual migraine, intractable, without status migrainosus: Secondary | ICD-10-CM | POA: Diagnosis not present

## 2020-01-30 DIAGNOSIS — N952 Postmenopausal atrophic vaginitis: Secondary | ICD-10-CM | POA: Diagnosis not present

## 2020-02-01 ENCOUNTER — Ambulatory Visit: Payer: BC Managed Care – PPO | Admitting: Orthotics

## 2020-02-01 ENCOUNTER — Other Ambulatory Visit: Payer: Self-pay

## 2020-02-01 DIAGNOSIS — M2021 Hallux rigidus, right foot: Secondary | ICD-10-CM

## 2020-02-01 DIAGNOSIS — Q666 Other congenital valgus deformities of feet: Secondary | ICD-10-CM

## 2020-02-01 NOTE — Progress Notes (Signed)
Patient came in today to pick up custom made foot orthotics.  The goals were accomplished and the patient reported no dissatisfaction with said orthotics.  Patient was advised of breakin period and how to report any issues. 

## 2020-02-02 DIAGNOSIS — J301 Allergic rhinitis due to pollen: Secondary | ICD-10-CM | POA: Diagnosis not present

## 2020-02-02 DIAGNOSIS — H1045 Other chronic allergic conjunctivitis: Secondary | ICD-10-CM | POA: Diagnosis not present

## 2020-02-02 DIAGNOSIS — R21 Rash and other nonspecific skin eruption: Secondary | ICD-10-CM | POA: Diagnosis not present

## 2020-02-02 DIAGNOSIS — J3089 Other allergic rhinitis: Secondary | ICD-10-CM | POA: Diagnosis not present

## 2020-02-06 ENCOUNTER — Ambulatory Visit (INDEPENDENT_AMBULATORY_CARE_PROVIDER_SITE_OTHER): Payer: BC Managed Care – PPO

## 2020-02-06 ENCOUNTER — Other Ambulatory Visit: Payer: Self-pay

## 2020-02-06 DIAGNOSIS — E538 Deficiency of other specified B group vitamins: Secondary | ICD-10-CM | POA: Diagnosis not present

## 2020-02-06 MED ORDER — CYANOCOBALAMIN 1000 MCG/ML IJ SOLN
1000.0000 ug | Freq: Once | INTRAMUSCULAR | Status: AC
  Administered 2020-02-06: 1000 ug via INTRAMUSCULAR

## 2020-02-06 NOTE — Progress Notes (Signed)
Patient presented for B 12 injection to right deltoid, patient voiced no concerns nor showed any signs of distress during injection. 

## 2020-02-07 DIAGNOSIS — J301 Allergic rhinitis due to pollen: Secondary | ICD-10-CM | POA: Diagnosis not present

## 2020-02-07 DIAGNOSIS — J3089 Other allergic rhinitis: Secondary | ICD-10-CM | POA: Diagnosis not present

## 2020-02-14 DIAGNOSIS — J3089 Other allergic rhinitis: Secondary | ICD-10-CM | POA: Diagnosis not present

## 2020-02-14 DIAGNOSIS — J301 Allergic rhinitis due to pollen: Secondary | ICD-10-CM | POA: Diagnosis not present

## 2020-02-20 ENCOUNTER — Other Ambulatory Visit: Payer: Self-pay | Admitting: Internal Medicine

## 2020-02-20 NOTE — Telephone Encounter (Signed)
rx ok'd for alprazolam #30 with one refill.   

## 2020-02-21 DIAGNOSIS — J301 Allergic rhinitis due to pollen: Secondary | ICD-10-CM | POA: Diagnosis not present

## 2020-02-21 DIAGNOSIS — J3089 Other allergic rhinitis: Secondary | ICD-10-CM | POA: Diagnosis not present

## 2020-02-28 ENCOUNTER — Encounter: Payer: Self-pay | Admitting: Podiatry

## 2020-02-28 ENCOUNTER — Ambulatory Visit (INDEPENDENT_AMBULATORY_CARE_PROVIDER_SITE_OTHER): Payer: BC Managed Care – PPO | Admitting: Podiatry

## 2020-02-28 ENCOUNTER — Other Ambulatory Visit: Payer: Self-pay

## 2020-02-28 DIAGNOSIS — M2021 Hallux rigidus, right foot: Secondary | ICD-10-CM

## 2020-02-28 DIAGNOSIS — J3089 Other allergic rhinitis: Secondary | ICD-10-CM | POA: Diagnosis not present

## 2020-02-28 DIAGNOSIS — M7751 Other enthesopathy of right foot: Secondary | ICD-10-CM

## 2020-02-28 DIAGNOSIS — J301 Allergic rhinitis due to pollen: Secondary | ICD-10-CM | POA: Diagnosis not present

## 2020-02-29 ENCOUNTER — Encounter: Payer: Self-pay | Admitting: Podiatry

## 2020-02-29 NOTE — Progress Notes (Signed)
Subjective:  Patient ID: Shannon [name], female    DOB: 1965-04-03,  MRN: [id number]  Chief Complaint  Patient presents with  . Foot Pain    "its doing much better"    55 y.o. female presents with the above complaint. Patient presents with a complaint of right first metatarsophalangeal joint capsulitis/arthritis. Patient states that she is doing 100% better the injection worked incredibly. She has also obtained orthotics and is functioning well in them. She denies any other acute complaints.   Review of Systems: Negative except as noted in the HPI. Denies N/V/F/Ch.  Past Medical History:  Diagnosis Date  . Anemia   . Anxiety   . Breast pain, right 11/07/2012  . Breathing difficulty 06/20/2014  . Environmental allergies   . GERD (gastroesophageal reflux disease)   . Hiatal hernia    small  . History of migraine headaches   . Hyperthyroidism    s/p ablation  . Vaginitis 01/16/2013    Current Outpatient Medications:  .  acetaminophen (TYLENOL) 500 MG tablet, Take 1,000 mg by mouth every 6 (six) hours as needed for moderate pain., Disp: , Rfl:  .  ALPRAZolam (XANAX) 0.25 MG tablet, TAKE ONE TABLET DAILY IF NEEDED FOR ANXIETY, Disp: 30 tablet, Rfl: 1 .  cholecalciferol (VITAMIN D3) 25 MCG (1000 UNIT) tablet, Take 1,000 Units by mouth daily., Disp: , Rfl:  .  Cyanocobalamin (VITAMIN B-12) 1000 MCG/15ML LIQD, Take 1,000 mcg by mouth every 30 (thirty) days., Disp: , Rfl:  .  EPINEPHrine 0.3 mg/0.3 mL IJ SOAJ injection, , Disp: , Rfl:  .  famotidine (PEPCID) 40 MG tablet, Take by mouth., Disp: , Rfl:  .  omeprazole (PRILOSEC) 40 MG capsule, Take 40 mg by mouth daily., Disp: , Rfl:  .  ondansetron (ZOFRAN ODT) 4 MG disintegrating tablet, Take 1 tablet (4 mg total) by mouth 2 (two) times daily as needed for nausea or vomiting., Disp: 10 tablet, Rfl: 0 .  SUMAtriptan (IMITREX) 100 MG tablet, , Disp: , Rfl:   Social History   Tobacco Use  Smoking Status Former Smoker  . Quit date:  01/28/2000  . Years since quitting: 20.1  Smokeless Tobacco Never Used    Allergies  Allergen Reactions  . Codeine Other (See Comments)    Severe Headache  . Decongest-Aid [Pseudoephedrine] Other (See Comments)    Increased heart rate  . Flagyl [Metronidazole]   . Sulfa Antibiotics    Objective:  There were no vitals filed for this visit. There is no height or weight on file to calculate BMI. Constitutional Well developed. Well nourished.  Vascular Dorsalis pedis pulses palpable bilaterally. Posterior tibial pulses palpable bilaterally. Capillary refill normal to all digits.  No cyanosis or clubbing noted. Pedal hair growth normal.  Neurologic Normal speech. Oriented to person, place, and time. Epicritic sensation to light touch grossly present bilaterally.  Dermatologic Nails well groomed and normal in appearance. No open wounds. No skin lesions.  Orthopedic:  No pain on palpation of the right first metatarsophalangeal joint. No pain with range of motion of the first metatarsophalangeal joint. No further intra-articular pain noted.  No pain at the sesamoidal complex.  No extensor or flexor tendinitis noted.   Radiographs: None Assessment:   1. Hallux rigidus of right foot   2. Capsulitis of metatarsophalangeal (MTP) joint of right foot    Plan:  Patient was evaluated and treated and all questions answered.  Right first MPJ capsulitis with underlying osteoarthritis -Clinically healed with 100% improvement with  the steroid injection. At this time I will hold off on any further injection. I discussed with the patient importance of shoe gear modification as well as continuous use of orthotics. She states understanding will use them. If the orthotics were out I will asked her to come and see me right away. -If any foot and ankle issues arise in the future come back and see me.   Right hallux rigidus with underlying pes planovalgus semiflexible -I explained patient the  etiology of hallux rigidus and various treatment options were extensively discussed.  I believe patient will benefit from custom-made orthotics to help control the hindfoot motion and support the arch of the foot with an incorporation of Morton's extension to take the stress off of the right first metatarsophalangeal joint. -She has obtained orthotics and is functioning well without acute issues.  No follow-ups on file.

## 2020-03-06 DIAGNOSIS — H16223 Keratoconjunctivitis sicca, not specified as Sjogren's, bilateral: Secondary | ICD-10-CM | POA: Diagnosis not present

## 2020-03-08 ENCOUNTER — Ambulatory Visit (INDEPENDENT_AMBULATORY_CARE_PROVIDER_SITE_OTHER): Payer: BC Managed Care – PPO

## 2020-03-08 ENCOUNTER — Other Ambulatory Visit: Payer: Self-pay

## 2020-03-08 DIAGNOSIS — E538 Deficiency of other specified B group vitamins: Secondary | ICD-10-CM

## 2020-03-08 MED ORDER — CYANOCOBALAMIN 1000 MCG/ML IJ SOLN
1000.0000 ug | Freq: Once | INTRAMUSCULAR | Status: AC
  Administered 2020-03-08: 1000 ug via INTRAMUSCULAR

## 2020-03-08 MED ORDER — CYANOCOBALAMIN 1000 MCG/ML IJ SOLN
1000.0000 ug | Freq: Once | INTRAMUSCULAR | Status: AC
  Administered 2020-04-09: 1000 ug via INTRAMUSCULAR

## 2020-03-08 NOTE — Progress Notes (Signed)
Patient presented for B 12 injection to right deltoid, patient voiced no concerns nor showed any signs of distress during injection. 

## 2020-03-09 DIAGNOSIS — J3089 Other allergic rhinitis: Secondary | ICD-10-CM | POA: Diagnosis not present

## 2020-03-09 DIAGNOSIS — J301 Allergic rhinitis due to pollen: Secondary | ICD-10-CM | POA: Diagnosis not present

## 2020-03-13 DIAGNOSIS — J301 Allergic rhinitis due to pollen: Secondary | ICD-10-CM | POA: Diagnosis not present

## 2020-03-13 DIAGNOSIS — J3089 Other allergic rhinitis: Secondary | ICD-10-CM | POA: Diagnosis not present

## 2020-03-20 DIAGNOSIS — J3089 Other allergic rhinitis: Secondary | ICD-10-CM | POA: Diagnosis not present

## 2020-03-20 DIAGNOSIS — J3081 Allergic rhinitis due to animal (cat) (dog) hair and dander: Secondary | ICD-10-CM | POA: Diagnosis not present

## 2020-03-20 DIAGNOSIS — J301 Allergic rhinitis due to pollen: Secondary | ICD-10-CM | POA: Diagnosis not present

## 2020-03-26 ENCOUNTER — Other Ambulatory Visit (INDEPENDENT_AMBULATORY_CARE_PROVIDER_SITE_OTHER): Payer: BC Managed Care – PPO

## 2020-03-26 ENCOUNTER — Other Ambulatory Visit: Payer: Self-pay

## 2020-03-26 DIAGNOSIS — E559 Vitamin D deficiency, unspecified: Secondary | ICD-10-CM

## 2020-03-26 DIAGNOSIS — Z13220 Encounter for screening for lipoid disorders: Secondary | ICD-10-CM

## 2020-03-26 DIAGNOSIS — D649 Anemia, unspecified: Secondary | ICD-10-CM | POA: Diagnosis not present

## 2020-03-26 LAB — VITAMIN D 25 HYDROXY (VIT D DEFICIENCY, FRACTURES): VITD: 26.72 ng/mL — ABNORMAL LOW (ref 30.00–100.00)

## 2020-03-26 LAB — CBC WITH DIFFERENTIAL/PLATELET
Basophils Absolute: 0.1 10*3/uL (ref 0.0–0.1)
Basophils Relative: 1.3 % (ref 0.0–3.0)
Eosinophils Absolute: 0.2 10*3/uL (ref 0.0–0.7)
Eosinophils Relative: 3.2 % (ref 0.0–5.0)
HCT: 39.1 % (ref 36.0–46.0)
Hemoglobin: 13.3 g/dL (ref 12.0–15.0)
Lymphocytes Relative: 28.4 % (ref 12.0–46.0)
Lymphs Abs: 1.4 10*3/uL (ref 0.7–4.0)
MCHC: 33.9 g/dL (ref 30.0–36.0)
MCV: 89.4 fl (ref 78.0–100.0)
Monocytes Absolute: 0.3 10*3/uL (ref 0.1–1.0)
Monocytes Relative: 6.5 % (ref 3.0–12.0)
Neutro Abs: 2.9 10*3/uL (ref 1.4–7.7)
Neutrophils Relative %: 60.6 % (ref 43.0–77.0)
Platelets: 242 10*3/uL (ref 150.0–400.0)
RBC: 4.37 Mil/uL (ref 3.87–5.11)
RDW: 13.8 % (ref 11.5–15.5)
WBC: 4.8 10*3/uL (ref 4.0–10.5)

## 2020-03-26 LAB — COMPREHENSIVE METABOLIC PANEL
ALT: 15 U/L (ref 0–35)
AST: 17 U/L (ref 0–37)
Albumin: 4.2 g/dL (ref 3.5–5.2)
Alkaline Phosphatase: 70 U/L (ref 39–117)
BUN: 10 mg/dL (ref 6–23)
CO2: 28 mEq/L (ref 19–32)
Calcium: 9.7 mg/dL (ref 8.4–10.5)
Chloride: 105 mEq/L (ref 96–112)
Creatinine, Ser: 0.62 mg/dL (ref 0.40–1.20)
GFR: 99.47 mL/min (ref >60.00)
Glucose, Bld: 78 mg/dL (ref 70–99)
Potassium: 4 mEq/L (ref 3.5–5.1)
Sodium: 141 mEq/L (ref 135–145)
Total Bilirubin: 0.4 mg/dL (ref 0.2–1.2)
Total Protein: 6.7 g/dL (ref 6.0–8.3)

## 2020-03-26 LAB — LIPID PANEL
Cholesterol: 200 mg/dL (ref 0–200)
HDL: 74.9 mg/dL (ref >39.00)
LDL Cholesterol: 104 mg/dL — ABNORMAL HIGH (ref 0–99)
NonHDL: 125.35
Total CHOL/HDL Ratio: 3
Triglycerides: 108 mg/dL (ref 0.0–149.0)
VLDL: 21.6 mg/dL (ref 0.0–40.0)

## 2020-03-26 LAB — TSH: TSH: 1.36 u[IU]/mL (ref 0.35–4.50)

## 2020-03-27 DIAGNOSIS — J3089 Other allergic rhinitis: Secondary | ICD-10-CM | POA: Diagnosis not present

## 2020-03-27 DIAGNOSIS — J301 Allergic rhinitis due to pollen: Secondary | ICD-10-CM | POA: Diagnosis not present

## 2020-03-29 ENCOUNTER — Encounter: Payer: Self-pay | Admitting: Internal Medicine

## 2020-03-29 ENCOUNTER — Ambulatory Visit (INDEPENDENT_AMBULATORY_CARE_PROVIDER_SITE_OTHER): Payer: BC Managed Care – PPO | Admitting: Internal Medicine

## 2020-03-29 ENCOUNTER — Other Ambulatory Visit: Payer: Self-pay

## 2020-03-29 ENCOUNTER — Other Ambulatory Visit (HOSPITAL_COMMUNITY)
Admission: RE | Admit: 2020-03-29 | Discharge: 2020-03-29 | Disposition: A | Discharge status: Home / Self Care | Payer: BC Managed Care – PPO | Source: Ambulatory Visit | Attending: Internal Medicine | Admitting: Internal Medicine

## 2020-03-29 VITALS — BP 110/60 | HR 76 | Temp 98.3°F | Ht 62.0 in | Wt 130.0 lb

## 2020-03-29 DIAGNOSIS — Z124 Encounter for screening for malignant neoplasm of cervix: Secondary | ICD-10-CM | POA: Diagnosis not present

## 2020-03-29 DIAGNOSIS — Z0000 Encounter for general adult medical examination without abnormal findings: Secondary | ICD-10-CM | POA: Diagnosis not present

## 2020-03-29 DIAGNOSIS — N949 Unspecified condition associated with female genital organs and menstrual cycle: Secondary | ICD-10-CM

## 2020-03-29 DIAGNOSIS — Z809 Family history of malignant neoplasm, unspecified: Secondary | ICD-10-CM

## 2020-03-29 DIAGNOSIS — D649 Anemia, unspecified: Secondary | ICD-10-CM

## 2020-03-29 DIAGNOSIS — F419 Anxiety disorder, unspecified: Secondary | ICD-10-CM

## 2020-03-29 DIAGNOSIS — M48061 Spinal stenosis, lumbar region without neurogenic claudication: Secondary | ICD-10-CM

## 2020-03-29 DIAGNOSIS — K219 Gastro-esophageal reflux disease without esophagitis: Secondary | ICD-10-CM | POA: Diagnosis not present

## 2020-03-29 NOTE — Progress Notes (Signed)
Patient ID: Shannon [name], female   DOB: Sep 09, 1965, 55 y.o.   MRN: [id number]   Subjective:    Patient ID: Shannon [name], female    DOB: 01-30-1965, 55 y.o.   MRN: [id number]  HPI This visit occurred during the SARS-CoV-2 public health emergency.  Safety protocols were in place, including screening questions prior to the visit, additional usage of staff PPE, and extensive cleaning of exam room while observing appropriate contact time as indicated for disinfecting solutions.  Patient here for her physical exam.  She is doing relatively well.  Feels good.  Increased stress.  Mother and father with cancer.  Discussed.  Overall appears to be handling things relatively well.   Tries to stay active.  No chest pain or sob with increased activity or exertion.  No acid reflux or abdominal pain reported.  Bowels moving.  Discussed genetic testing.  Saw gyn.  Recent vaginal discomfort.  Was using estrogen cream.  Symptoms resolved.    Past Medical History:  Diagnosis Date  . Anemia   . Anxiety   . Breast pain, right 11/07/2012  . Breathing difficulty 06/20/2014  . Environmental allergies   . GERD (gastroesophageal reflux disease)   . Hiatal hernia    small  . History of migraine headaches   . Hyperthyroidism    s/p ablation  . Vaginitis 01/16/2013   Past Surgical History:  Procedure Laterality Date  . BREAST BIOPSY Right 12/05/2015   neg  . CESAREAN SECTION  11/98   Dr Roena Malady  . CHOLECYSTECTOMY  11/05   Dr Pat Patrick  . JOINT REPLACEMENT Left 2017   hip  . NASAL SINUS SURGERY  11/93  . SEPTOPLASTY  5/90   Dr Rossie Muskrat  . TUBAL LIGATION  02/1998  . UMBILICAL HERNIA REPAIR  02/1998   Family History  Problem Relation Age of Onset  . Heart disease Father   . Hypertension Father   . Thyroid disease Father   . Hypercholesterolemia Father   . Kidney cancer Father   . Leukemia Father        hairy cell  . Hypercholesterolemia Mother   . Thyroid disease Mother   . Breast cancer Mother 51  .  Breast cancer Other        maternal great grandmother  . Heart disease Maternal Grandfather        MI - age 84  . Prostate cancer Paternal Grandfather   . Pancreatic cancer Paternal Grandfather   . Lung cancer Paternal Grandfather   . Bladder Cancer Neg Hx    Social History   Socioeconomic History  . Marital status: Married    Spouse name: Not on file  . Number of children: 1  . Years of education: Not on file  . Highest education level: Not on file  Occupational History  . Not on file  Tobacco Use  . Smoking status: Former Smoker    Quit date: 01/28/2000    Years since quitting: 20.1  . Smokeless tobacco: Never Used  Substance and Sexual Activity  . Alcohol use: No    Alcohol/week: 0.0 standard drinks  . Drug use: No  . Sexual activity: Not on file  Other Topics Concern  . Not on file  Social History Narrative  . Not on file   Social Determinants of Health   Financial Resource Strain: Not on file  Food Insecurity: Not on file  Transportation Needs: Not on file  Physical Activity: Not on file  Stress: Not  on file  Social Connections: Not on file    Outpatient Encounter Medications as of 03/29/2020  Medication Sig  . acetaminophen (TYLENOL) 500 MG tablet Take 1,000 mg by mouth every 6 (six) hours as needed for moderate pain.  Marland Kitchen ALPRAZolam (XANAX) 0.25 MG tablet TAKE ONE TABLET DAILY IF NEEDED FOR ANXIETY  . cholecalciferol (VITAMIN D3) 25 MCG (1000 UNIT) tablet Take 1,000 Units by mouth daily.  . Cyanocobalamin (VITAMIN B-12) 1000 MCG/15ML LIQD Take 1,000 mcg by mouth every 30 (thirty) days.  Marland Kitchen EPINEPHrine 0.3 mg/0.3 mL IJ SOAJ injection   . Estriol 10 % CREA by Does not apply route.  . famotidine (PEPCID) 40 MG tablet Take by mouth.  Marland Kitchen omeprazole (PRILOSEC) 40 MG capsule Take 40 mg by mouth daily.  . ondansetron (ZOFRAN ODT) 4 MG disintegrating tablet Take 1 tablet (4 mg total) by mouth 2 (two) times daily as needed for nausea or vomiting.  . SUMAtriptan (IMITREX)  100 MG tablet   . triamcinolone (NASACORT ALLERGY 24HR) 55 MCG/ACT AERO nasal inhaler Place 2 sprays into the nose daily.   Facility-Administered Encounter Medications as of 03/29/2020  Medication  . cyanocobalamin ((VITAMIN B-12)) injection 1,000 mcg    Review of Systems  Constitutional: Negative for appetite change and unexpected weight change.  HENT: Negative for congestion, sinus pressure and sore throat.   Eyes: Negative for pain and visual disturbance.  Respiratory: Negative for cough, chest tightness and shortness of breath.   Cardiovascular: Negative for chest pain, palpitations and leg swelling.  Gastrointestinal: Negative for abdominal pain, diarrhea, nausea and vomiting.  Genitourinary: Negative for difficulty urinating and dysuria.  Musculoskeletal: Negative for joint swelling and myalgias.  Skin: Negative for color change and rash.  Neurological: Negative for dizziness, light-headedness and headaches.  Hematological: Negative for adenopathy. Does not bruise/bleed easily.  Psychiatric/Behavioral: Negative for agitation and dysphoric mood.       Objective:    Physical Exam Vitals reviewed.  Constitutional:      General: She is not in acute distress.    Appearance: Normal appearance. She is well-developed and well-nourished.  HENT:     Head: Normocephalic and atraumatic.     Right Ear: External ear normal.     Left Ear: External ear normal.     Mouth/Throat:     Mouth: Oropharynx is clear and moist.  Eyes:     General: No scleral icterus.       Right eye: No discharge.        Left eye: No discharge.     Conjunctiva/sclera: Conjunctivae normal.  Neck:     Thyroid: No thyromegaly.  Cardiovascular:     Rate and Rhythm: Normal rate and regular rhythm.  Pulmonary:     Effort: No tachypnea, accessory muscle usage or respiratory distress.     Breath sounds: Normal breath sounds. No decreased breath sounds or wheezing.  Chest:  Breasts:     Right: No inverted  nipple, mass, nipple discharge or tenderness (no axillary adenopathy).     Left: No inverted nipple, mass, nipple discharge or tenderness (no axilarry adenopathy).    Abdominal:     General: Bowel sounds are normal.     Palpations: Abdomen is soft.     Tenderness: There is no abdominal tenderness.  Genitourinary:    Comments: Normal external genitalia.  Vaginal vault without lesions.  Cervix identified.  Pap smear performed.  Could not appreciate any adnexal masses or tenderness.   Musculoskeletal:  General: No swelling, tenderness or edema.     Cervical back: Neck supple. No tenderness.  Lymphadenopathy:     Cervical: No cervical adenopathy.  Skin:    Findings: No erythema or rash.  Neurological:     Mental Status: She is alert and oriented to person, place, and time.  Psychiatric:        Mood and Affect: Mood and affect and mood normal.        Behavior: Behavior normal.     BP 110/60   Pulse 76   Temp 98.3 F (36.8 C)   Ht 5\' 2"  (1.575 m)   Wt 130 lb (59 kg)   LMP 02/12/2013   SpO2 98%   BMI 23.78 kg/m  Wt Readings from Last 3 Encounters:  03/29/20 130 lb (59 kg)  01/26/20 125 lb (56.7 kg)  09/15/19 127 lb 9.6 oz (57.9 kg)     Lab Results  Component Value Date   WBC 4.8 03/26/2020   HGB 13.3 03/26/2020   HCT 39.1 03/26/2020   PLT 242.0 03/26/2020   GLUCOSE 78 03/26/2020   CHOL 200 03/26/2020   TRIG 108.0 03/26/2020   HDL 74.90 03/26/2020   LDLCALC 104 (H) 03/26/2020   ALT 15 03/26/2020   AST 17 03/26/2020   NA 141 03/26/2020   K 4.0 03/26/2020   CL 105 03/26/2020   CREATININE 0.62 03/26/2020   BUN 10 03/26/2020   CO2 28 03/26/2020   TSH 1.36 03/26/2020       Assessment & Plan:   Problem List Items Addressed This Visit    Anemia    Follow cbc.       Anxiety    Discussed.  Overall appears to be handling things relatively well.  Follow.  Using xanax prn.       Family history of cancer    Family history of cancer.  Discussed genetic  testing.  Will notify me if desires further testing.        GERD (gastroesophageal reflux disease)    Saw GI.  Stable.  On prilosec.       Health care maintenance    Physical today 03/29/20.  PAP 03/29/20.  Colonoscopy 11/2014.  Recommended f/u in 11/2024.  Mammogram 12/09/19 - Birads I.       Lumbar lateral recess stenosis (L4-5) (Left) (Chronic)    Followed by pain clinic.        Vaginal discomfort    Saw gyn.  Previously used vaginal estrogen. Off now.  Questions answered.  Follow.        Other Visit Diagnoses    Routine general medical examination at a health care facility    -  Primary   Cervical cancer screening       Relevant Orders   Cytology - PAP( [place])       [name], MD

## 2020-03-31 ENCOUNTER — Encounter: Payer: Self-pay | Admitting: Internal Medicine

## 2020-03-31 DIAGNOSIS — Z809 Family history of malignant neoplasm, unspecified: Secondary | ICD-10-CM | POA: Insufficient documentation

## 2020-03-31 DIAGNOSIS — N949 Unspecified condition associated with female genital organs and menstrual cycle: Secondary | ICD-10-CM | POA: Insufficient documentation

## 2020-03-31 NOTE — Assessment & Plan Note (Signed)
Family history of cancer.  Discussed genetic testing.  Will notify me if desires further testing.

## 2020-03-31 NOTE — Assessment & Plan Note (Signed)
Physical today 03/29/20.  PAP 03/29/20.  Colonoscopy 11/2014.  Recommended f/u in 11/2024.  Mammogram 12/09/19 - Birads I.

## 2020-03-31 NOTE — Assessment & Plan Note (Signed)
Followed by pain clinic.  

## 2020-03-31 NOTE — Assessment & Plan Note (Signed)
Discussed.  Overall appears to be handling things relatively well.  Follow.  Using xanax prn.

## 2020-03-31 NOTE — Assessment & Plan Note (Signed)
Saw gyn.  Previously used vaginal estrogen. Off now.  Questions answered.  Follow.

## 2020-03-31 NOTE — Assessment & Plan Note (Signed)
Saw GI.  Stable.  On prilosec.

## 2020-03-31 NOTE — Assessment & Plan Note (Signed)
Follow cbc.  

## 2020-04-02 LAB — CYTOLOGY - PAP
Comment: NEGATIVE
Diagnosis: NEGATIVE
High risk HPV: NEGATIVE

## 2020-04-03 DIAGNOSIS — J3089 Other allergic rhinitis: Secondary | ICD-10-CM | POA: Diagnosis not present

## 2020-04-03 DIAGNOSIS — J3081 Allergic rhinitis due to animal (cat) (dog) hair and dander: Secondary | ICD-10-CM | POA: Diagnosis not present

## 2020-04-03 DIAGNOSIS — J301 Allergic rhinitis due to pollen: Secondary | ICD-10-CM | POA: Diagnosis not present

## 2020-04-09 ENCOUNTER — Ambulatory Visit (INDEPENDENT_AMBULATORY_CARE_PROVIDER_SITE_OTHER): Payer: BC Managed Care – PPO | Admitting: Internal Medicine

## 2020-04-09 ENCOUNTER — Other Ambulatory Visit: Payer: Self-pay

## 2020-04-09 DIAGNOSIS — E538 Deficiency of other specified B group vitamins: Secondary | ICD-10-CM | POA: Diagnosis not present

## 2020-04-09 NOTE — Progress Notes (Addendum)
Patient presented for B 12 injection to left deltoid, patient voiced no concerns nor showed any signs of distress during injection.  Reviewed.  Dr Scott 

## 2020-04-10 DIAGNOSIS — J3081 Allergic rhinitis due to animal (cat) (dog) hair and dander: Secondary | ICD-10-CM | POA: Diagnosis not present

## 2020-04-10 DIAGNOSIS — J3089 Other allergic rhinitis: Secondary | ICD-10-CM | POA: Diagnosis not present

## 2020-04-10 DIAGNOSIS — J301 Allergic rhinitis due to pollen: Secondary | ICD-10-CM | POA: Diagnosis not present

## 2020-04-13 ENCOUNTER — Ambulatory Visit: Payer: BC Managed Care – PPO | Admitting: Internal Medicine

## 2020-04-13 ENCOUNTER — Encounter: Payer: Self-pay | Admitting: Internal Medicine

## 2020-04-13 ENCOUNTER — Other Ambulatory Visit: Payer: Self-pay

## 2020-04-13 DIAGNOSIS — M542 Cervicalgia: Secondary | ICD-10-CM | POA: Diagnosis not present

## 2020-04-13 DIAGNOSIS — M26622 Arthralgia of left temporomandibular joint: Secondary | ICD-10-CM

## 2020-04-13 NOTE — Progress Notes (Signed)
Patient ID: Shannon [name], female   DOB: 1965-08-25, 55 y.o.   MRN: [id number]   Subjective:    Patient ID: Shannon [name], female    DOB: 1965-08-10, 55 y.o.   MRN: [id number]  HPI This visit occurred during the SARS-CoV-2 public health emergency.  Safety protocols were in place, including screening questions prior to the visit, additional usage of staff PPE, and extensive cleaning of exam room while observing appropriate contact time as indicated for disinfecting solutions.  Patient here for work in appt.  Work in for "earache".  On questioning her, she reports symptoms started one week ago.  Notices if she pushes on her ear - sore.  If she turns her head to the right, feels like she is in a barrel.  No inner ear pain.  No hearing change since started.  No tinnitus.  No headache.  Some neck discomfort.  Went to her chiropractor.  Neck better adjustment.  No increased sinus pressure or sore throat. No pain with chewing.  No tooth or gum lesions or pain.    Past Medical History:  Diagnosis Date  . Anemia   . Anxiety   . Breast pain, right 11/07/2012  . Breathing difficulty 06/20/2014  . Environmental allergies   . GERD (gastroesophageal reflux disease)   . Hiatal hernia    small  . History of migraine headaches   . Hyperthyroidism    s/p ablation  . Vaginitis 01/16/2013   Past Surgical History:  Procedure Laterality Date  . BREAST BIOPSY Right 12/05/2015   neg  . CESAREAN SECTION  11/98   Dr Roena Malady  . CHOLECYSTECTOMY  11/05   Dr Pat Patrick  . JOINT REPLACEMENT Left 2017   hip  . NASAL SINUS SURGERY  11/93  . SEPTOPLASTY  5/90   Dr Rossie Muskrat  . TUBAL LIGATION  02/1998  . UMBILICAL HERNIA REPAIR  02/1998   Family History  Problem Relation Age of Onset  . Heart disease Father   . Hypertension Father   . Thyroid disease Father   . Hypercholesterolemia Father   . Kidney cancer Father   . Leukemia Father        hairy cell  . Hypercholesterolemia Mother   . Thyroid disease Mother    . Breast cancer Mother 54  . Breast cancer Other        maternal great grandmother  . Heart disease Maternal Grandfather        MI - age 14  . Prostate cancer Paternal Grandfather   . Pancreatic cancer Paternal Grandfather   . Lung cancer Paternal Grandfather   . Bladder Cancer Neg Hx    Social History   Socioeconomic History  . Marital status: Married    Spouse name: Not on file  . Number of children: 1  . Years of education: Not on file  . Highest education level: Not on file  Occupational History  . Not on file  Tobacco Use  . Smoking status: Former Smoker    Quit date: 01/28/2000    Years since quitting: 20.2  . Smokeless tobacco: Never Used  Substance and Sexual Activity  . Alcohol use: No    Alcohol/week: 0.0 standard drinks  . Drug use: No  . Sexual activity: Not on file  Other Topics Concern  . Not on file  Social History Narrative  . Not on file   Social Determinants of Health   Financial Resource Strain: Not on file  Food Insecurity: Not on  file  Transportation Needs: Not on file  Physical Activity: Not on file  Stress: Not on file  Social Connections: Not on file    Outpatient Encounter Medications as of 04/13/2020  Medication Sig  . acetaminophen (TYLENOL) 500 MG tablet Take 1,000 mg by mouth every 6 (six) hours as needed for moderate pain.  Marland Kitchen ALPRAZolam (XANAX) 0.25 MG tablet TAKE ONE TABLET DAILY IF NEEDED FOR ANXIETY  . cholecalciferol (VITAMIN D3) 25 MCG (1000 UNIT) tablet Take 1,000 Units by mouth daily.  . Cyanocobalamin (VITAMIN B-12) 1000 MCG/15ML LIQD Take 1,000 mcg by mouth every 30 (thirty) days.  Marland Kitchen EPINEPHrine 0.3 mg/0.3 mL IJ SOAJ injection   . Estriol 10 % CREA by Does not apply route.  . famotidine (PEPCID) 40 MG tablet Take by mouth.  Marland Kitchen omeprazole (PRILOSEC) 40 MG capsule Take 40 mg by mouth daily.  . ondansetron (ZOFRAN ODT) 4 MG disintegrating tablet Take 1 tablet (4 mg total) by mouth 2 (two) times daily as needed for nausea or  vomiting.  . SUMAtriptan (IMITREX) 100 MG tablet   . triamcinolone (NASACORT) 55 MCG/ACT AERO nasal inhaler Place 2 sprays into the nose daily.   No facility-administered encounter medications on file as of 04/13/2020.    Review of Systems  Constitutional: Negative for appetite change, fever and unexpected weight change.  HENT: Negative for sinus pressure and sore throat.        No increased sinus congestion or pressure.  Using nasacort and saline nasal spray to help with allergies.    Respiratory: Negative for cough, chest tightness and shortness of breath.   Cardiovascular: Negative for chest pain, palpitations and leg swelling.  Gastrointestinal: Negative for abdominal pain, diarrhea, nausea and vomiting.  Genitourinary: Negative for difficulty urinating and dysuria.  Musculoskeletal: Negative for joint swelling and myalgias.  Skin: Negative for color change and rash.  Neurological: Negative for dizziness, light-headedness and headaches.  Psychiatric/Behavioral: Negative for agitation and dysphoric mood.       Objective:    Physical Exam Vitals reviewed.  Constitutional:      General: She is not in acute distress.    Appearance: Normal appearance.  HENT:     Head: Normocephalic and atraumatic.     Comments: Increased pressure - left - TMJ and angle of jaw with opening and closing mouth.     Right Ear: Tympanic membrane, ear canal and external ear normal. There is no impacted cerumen.     Left Ear: Tympanic membrane, ear canal and external ear normal. There is no impacted cerumen.     Nose: Nose normal. No congestion.     Mouth/Throat:     Pharynx: Oropharynx is clear.  Eyes:     General: No scleral icterus.       Right eye: No discharge.        Left eye: No discharge.     Extraocular Movements: Extraocular movements intact.     Conjunctiva/sclera: Conjunctivae normal.  Neck:     Thyroid: No thyromegaly.  Cardiovascular:     Rate and Rhythm: Normal rate and regular  rhythm.  Pulmonary:     Effort: No respiratory distress.     Breath sounds: Normal breath sounds. No wheezing.  Abdominal:     General: Bowel sounds are normal.     Palpations: Abdomen is soft.     Tenderness: There is no abdominal tenderness.  Musculoskeletal:        General: No swelling or tenderness.     Cervical  back: Neck supple.  Lymphadenopathy:     Cervical: No cervical adenopathy.  Skin:    Findings: No erythema or rash.  Neurological:     Mental Status: She is alert.  Psychiatric:        Mood and Affect: Mood normal.        Behavior: Behavior normal.     BP 118/80 (BP Location: Left Arm, Patient Position: Sitting)   Pulse 88   Temp 98 F (36.7 C)   Ht 5' 2.01" (1.575 m)   Wt 130 lb (59 kg)   LMP 02/12/2013   SpO2 97%   BMI 23.77 kg/m  Wt Readings from Last 3 Encounters:  04/13/20 130 lb (59 kg)  03/29/20 130 lb (59 kg)  01/26/20 125 lb (56.7 kg)     Lab Results  Component Value Date   WBC 4.8 03/26/2020   HGB 13.3 03/26/2020   HCT 39.1 03/26/2020   PLT 242.0 03/26/2020   GLUCOSE 78 03/26/2020   CHOL 200 03/26/2020   TRIG 108.0 03/26/2020   HDL 74.90 03/26/2020   LDLCALC 104 (H) 03/26/2020   ALT 15 03/26/2020   AST 17 03/26/2020   NA 141 03/26/2020   K 4.0 03/26/2020   CL 105 03/26/2020   CREATININE 0.62 03/26/2020   BUN 10 03/26/2020   CO2 28 03/26/2020   TSH 1.36 03/26/2020       Assessment & Plan:   Problem List Items Addressed This Visit    Neck pain    Better after seeing chiropractor.  No headache.  Follow.       TMJ arthralgia    Symptoms and exam as outlined.  Soreness with palpation - left.  Noted soreness with opening and closing mouth when pressure applied.  No ear infection noted.  Canal clear.  Continue nasacort and saline nasal spray.  Afrin nasal spray for three days.  Tylenol.  Mouth guard.  Follow.  Call with update.           [name], MD

## 2020-04-13 NOTE — Patient Instructions (Signed)
Afrin nasal spray - 2 sprays each nostril twice a day for three days.

## 2020-04-14 ENCOUNTER — Encounter: Payer: Self-pay | Admitting: Internal Medicine

## 2020-04-14 DIAGNOSIS — M542 Cervicalgia: Secondary | ICD-10-CM | POA: Insufficient documentation

## 2020-04-14 DIAGNOSIS — M26629 Arthralgia of temporomandibular joint, unspecified side: Secondary | ICD-10-CM | POA: Insufficient documentation

## 2020-04-14 NOTE — Assessment & Plan Note (Signed)
Better after seeing chiropractor.  No headache.  Follow.

## 2020-04-14 NOTE — Assessment & Plan Note (Signed)
Symptoms and exam as outlined.  Soreness with palpation - left.  Noted soreness with opening and closing mouth when pressure applied.  No ear infection noted.  Canal clear.  Continue nasacort and saline nasal spray.  Afrin nasal spray for three days.  Tylenol.  Mouth guard.  Follow.  Call with update.

## 2020-04-17 DIAGNOSIS — J3089 Other allergic rhinitis: Secondary | ICD-10-CM | POA: Diagnosis not present

## 2020-04-17 DIAGNOSIS — J301 Allergic rhinitis due to pollen: Secondary | ICD-10-CM | POA: Diagnosis not present

## 2020-04-18 DIAGNOSIS — G5603 Carpal tunnel syndrome, bilateral upper limbs: Secondary | ICD-10-CM | POA: Diagnosis not present

## 2020-04-19 DIAGNOSIS — J301 Allergic rhinitis due to pollen: Secondary | ICD-10-CM | POA: Diagnosis not present

## 2020-04-19 DIAGNOSIS — J3089 Other allergic rhinitis: Secondary | ICD-10-CM | POA: Diagnosis not present

## 2020-04-24 DIAGNOSIS — J3089 Other allergic rhinitis: Secondary | ICD-10-CM | POA: Diagnosis not present

## 2020-04-24 DIAGNOSIS — J301 Allergic rhinitis due to pollen: Secondary | ICD-10-CM | POA: Diagnosis not present

## 2020-05-01 DIAGNOSIS — J301 Allergic rhinitis due to pollen: Secondary | ICD-10-CM | POA: Diagnosis not present

## 2020-05-01 DIAGNOSIS — J3089 Other allergic rhinitis: Secondary | ICD-10-CM | POA: Diagnosis not present

## 2020-05-06 ENCOUNTER — Encounter: Payer: Self-pay | Admitting: Internal Medicine

## 2020-05-07 ENCOUNTER — Encounter: Payer: Self-pay | Admitting: Internal Medicine

## 2020-05-07 ENCOUNTER — Ambulatory Visit
Admission: EM | Admit: 2020-05-07 | Discharge: 2020-05-07 | Disposition: A | Discharge status: Home / Self Care | Payer: BC Managed Care – PPO | Attending: Emergency Medicine | Admitting: Emergency Medicine

## 2020-05-07 ENCOUNTER — Other Ambulatory Visit: Payer: Self-pay

## 2020-05-07 DIAGNOSIS — R030 Elevated blood-pressure reading, without diagnosis of hypertension: Secondary | ICD-10-CM | POA: Diagnosis not present

## 2020-05-07 DIAGNOSIS — N3000 Acute cystitis without hematuria: Secondary | ICD-10-CM | POA: Diagnosis not present

## 2020-05-07 LAB — POCT URINALYSIS DIP (MANUAL ENTRY)
Bilirubin, UA: NEGATIVE
Glucose, UA: NEGATIVE mg/dL
Nitrite, UA: NEGATIVE
Protein Ur, POC: 30 mg/dL — AB
Spec Grav, UA: 1.025 (ref 1.010–1.025)
Urobilinogen, UA: 0.2 E.U./dL
pH, UA: 6 (ref 5.0–8.0)

## 2020-05-07 MED ORDER — CEPHALEXIN 500 MG PO CAPS: 500.0000 mg | ORAL_CAPSULE | Freq: Two times a day (BID) | ORAL | 0 refills | Status: AC

## 2020-05-07 NOTE — Discharge Instructions (Signed)
Take the antibiotic as directed.  The urine culture is pending.  We will call you if it shows the need to change or discontinue your antibiotic.    Your blood pressure is elevated today at 141/103; repeat 136/88.  Please have this rechecked by your primary care provider in 2-4 weeks.

## 2020-05-07 NOTE — ED Triage Notes (Addendum)
Patient presents to Urgent Care with complaints of possible UTI. She c/o of urinary freq., abdominal pain, dysuria. Pt states she took AZO Friday and Saturday with no relief.     Denies fever or hematuria.

## 2020-05-07 NOTE — ED Provider Notes (Signed)
Shannon [name]    CSN: [id number] Arrival date & time: 05/07/20  3267      History   Chief Complaint Chief Complaint  Patient presents with  . Abdominal Pain    HPI Shannon [name] is a 55 y.o. female.   Patient presents with dysuria, urinary frequency, suprapubic pressure x4 days.  She denies fever, chills, hematuria, vaginal discharge, pelvic pain, nausea, vomiting, diarrhea, or other symptoms.  Treatment attempted at home with Azo with no relief.  Her medical history includes chronic back pain, chronic hip pain, osteoarthritis, chronic joint pain, chronic pain syndrome, GERD, anemia, migraines, anxiety.  The history is provided by the patient and medical records.    Past Medical History:  Diagnosis Date  . Anemia   . Anxiety   . Breast pain, right 11/07/2012  . Breathing difficulty 06/20/2014  . Environmental allergies   . GERD (gastroesophageal reflux disease)   . Hiatal hernia    small  . History of migraine headaches   . Hyperthyroidism    s/p ablation  . Vaginitis 01/16/2013    Patient Active Problem List   Diagnosis Date Noted  . TMJ arthralgia 04/14/2020  . Neck pain 04/14/2020  . Vaginal discomfort 03/31/2020  . Family history of cancer 03/31/2020  . Osteopenia 09/25/2019  . DDD (degenerative disc disease), cervical (C5-6, C6-7) 09/05/2019  . Grade 1 Anterolisthesis of C5/C6 09/05/2019  . Cervical facet arthropathy (Bilateral) (L>R) 09/05/2019  . Cervical facet syndrome (Bilateral) 09/05/2019  . Cervicalgia 08/22/2019  . Cervical myofascial pain syndrome (Left) 08/22/2019  . Occipital neuralgia (Left) 08/22/2019  . Nipple tenderness 07/03/2019  . Itching 04/24/2019  . Right calf pain 03/13/2019  . Axillary fullness 03/13/2019  . COVID-19 virus infection 01/16/2019  . Urinary frequency 12/26/2018  . Exposure to COVID-19 virus 11/21/2018  . Cerumen impaction 09/11/2018  . Preoperative testing 07/05/2018  . Vaginal discharge 05/30/2018  . DDD  (degenerative disc disease), lumbosacral 04/13/2018  . Vitamin D insufficiency 11/17/2017  . Osteoarthritis of great toe joint (Right) 10/15/2017  . Chronic toe pain (Right) 10/05/2017  . Great toe pain (Right) 10/05/2017  . Disorder of skeletal system 10/05/2017  . Pharmacologic therapy 10/05/2017  . Problems influencing health status 10/05/2017  . Lumbar Facet syndrome (Bilateral) (R>L) 07/20/2017  . Spondylosis without myelopathy or radiculopathy, lumbosacral region 07/20/2017  . Urinary urgency 06/28/2017  . Chronic pain syndrome 06/15/2017  . Other specified dorsopathies, sacral and sacrococcygeal region 06/15/2017  . Chronic myofascial pain syndrome (Right Gluteous Muscle) 03/03/2016  . Chronic sacroiliac joint pain (Bilateral) (R>L) 01/14/2016  . History of hip replacement (Left) 11/12/2015  . Osteoarthritis of hip (Left) 11/12/2015  . Osteoarthritis of sacroiliac joint (Bilateral) (R>L) 07/12/2015  . Axillary lump 05/22/2015  . Post-menopausal bleeding 02/20/2015  . Chronic low back pain (1ry area of Pain) (Bilateral) (R>L) 12/16/2014  . Lumbar lateral recess stenosis (L4-5) (Left) 12/16/2014  . Lumbar facet hypertrophy 12/16/2014  . Chronic lumbar radicular pain (intermittent) (L4 Dermatome) (Left) 12/16/2014  . Chronic hip pain (resolved after hip replacement) (Left) 12/16/2014  . Nicotine dependence 12/16/2014  . Tobacco abuse 12/16/2014  . Lumbar spondylosis 12/16/2014  . Encounter for chronic pain management 12/16/2014  . Gastroesophageal reflux disease without esophagitis 10/18/2014  . Anxiety 06/20/2014  . Health care maintenance 04/02/2014  . Menopausal symptoms 11/07/2012  . Migraine 05/21/2012  . Anemia 02/01/2012  . Hyperthyroidism 01/30/2012  . GERD (gastroesophageal reflux disease) 01/30/2012    Past Surgical History:  Procedure Laterality  Date  . BREAST BIOPSY Right 12/05/2015   neg  . CESAREAN SECTION  11/98   Dr Roena Malady  . CHOLECYSTECTOMY   11/05   Dr Pat Patrick  . JOINT REPLACEMENT Left 2017   hip  . NASAL SINUS SURGERY  11/93  . SEPTOPLASTY  5/90   Dr Rossie Muskrat  . TUBAL LIGATION  02/1998  . UMBILICAL HERNIA REPAIR  02/1998    OB History   No obstetric history on file.      Home Medications    Prior to Admission medications   Medication Sig Start Date End Date Taking? Authorizing Provider  cephALEXin (KEFLEX) 500 MG capsule Take 1 capsule (500 mg total) by mouth 2 (two) times daily for 5 days. 05/07/20 05/12/20 Yes Sharion Balloon, NP  acetaminophen (TYLENOL) 500 MG tablet Take 1,000 mg by mouth every 6 (six) hours as needed for moderate pain.    [provider]  ALPRAZolam Duanne Moron) 0.25 MG tablet TAKE ONE TABLET DAILY IF NEEDED FOR ANXIETY 02/20/20   [name], MD  cholecalciferol (VITAMIN D3) 25 MCG (1000 UNIT) tablet Take 1,000 Units by mouth daily.    [provider]  Cyanocobalamin (VITAMIN B-12) 1000 MCG/15ML LIQD Take 1,000 mcg by mouth every 30 (thirty) days.    [provider]  EPINEPHrine 0.3 mg/0.3 mL IJ SOAJ injection  01/14/18   [provider]  Estriol 10 % CREA by Does not apply route.    [provider]  famotidine (PEPCID) 40 MG tablet Take by mouth.    [provider]  omeprazole (PRILOSEC) 40 MG capsule Take 40 mg by mouth daily.    [provider]  ondansetron (ZOFRAN ODT) 4 MG disintegrating tablet Take 1 tablet (4 mg total) by mouth 2 (two) times daily as needed for nausea or vomiting. 06/28/19   [name], MD  SUMAtriptan (IMITREX) 100 MG tablet  02/15/18   [provider]  triamcinolone (NASACORT) 55 MCG/ACT AERO nasal inhaler Place 2 sprays into the nose daily.    [provider]    Family History Family History  Problem Relation Age of Onset  . Heart disease Father   . Hypertension Father   . Thyroid disease Father   . Hypercholesterolemia Father   . Kidney cancer Father   . Leukemia Father        hairy cell  .  Hypercholesterolemia Mother   . Thyroid disease Mother   . Breast cancer Mother 32  . Breast cancer Other        maternal great grandmother  . Heart disease Maternal Grandfather        MI - age 47  . Prostate cancer Paternal Grandfather   . Pancreatic cancer Paternal Grandfather   . Lung cancer Paternal Grandfather   . Bladder Cancer Neg Hx     Social History Social History   Tobacco Use  . Smoking status: Former Smoker    Quit date: 01/28/2000    Years since quitting: 20.2  . Smokeless tobacco: Never Used  Substance Use Topics  . Alcohol use: No    Alcohol/week: 0.0 standard drinks  . Drug use: No     Allergies   Codeine, Decongest-aid [pseudoephedrine], Flagyl [metronidazole], and Sulfa antibiotics   Review of Systems Review of Systems  Constitutional: Negative for chills and fever.  HENT: Negative for ear pain and sore throat.   Eyes: Negative for pain and visual disturbance.  Respiratory: Negative for cough and shortness of breath.  Cardiovascular: Negative for chest pain and palpitations.  Gastrointestinal: Positive for abdominal pain. Negative for diarrhea, nausea and vomiting.  Genitourinary: Positive for dysuria and frequency. Negative for hematuria, pelvic pain and vaginal discharge.  Musculoskeletal: Negative for arthralgias and back pain.  Skin: Negative for color change and rash.  Neurological: Negative for seizures and syncope.  All other systems reviewed and are negative.    Physical Exam Triage Vital Signs ED Triage Vitals  Enc Vitals Group     BP 05/07/20 1013 (!) 141/103     Pulse Rate 05/07/20 1013 82     Resp 05/07/20 1013 18     Temp 05/07/20 1013 98.4 F (36.9 C)     Temp Source 05/07/20 1013 Oral     SpO2 05/07/20 1013 98 %     Weight 05/07/20 1015 124 lb (56.2 kg)     Height --      Head Circumference --      Peak Flow --      Pain Score 05/07/20 1015 2     Pain Loc --      Pain Edu? --      Excl. in [place]? --    No data  found.  Updated Vital Signs BP 136/88 (BP Location: Left Arm)   Pulse 82   Temp 98.4 F (36.9 C) (Oral)   Resp 18   Wt 124 lb (56.2 kg)   LMP 02/12/2013   SpO2 98%   BMI 22.67 kg/m   Visual Acuity Right Eye Distance:   Left Eye Distance:   Bilateral Distance:    Right Eye Near:   Left Eye Near:    Bilateral Near:     Physical Exam Vitals and nursing note reviewed.  Constitutional:      General: She is not in acute distress.    Appearance: She is well-developed. She is not ill-appearing.  HENT:     Head: Normocephalic and atraumatic.     Mouth/Throat:     Mouth: Mucous membranes are moist.  Eyes:     Conjunctiva/sclera: Conjunctivae normal.  Cardiovascular:     Rate and Rhythm: Normal rate and regular rhythm.     Heart sounds: Normal heart sounds.  Pulmonary:     Effort: Pulmonary effort is normal. No respiratory distress.     Breath sounds: Normal breath sounds.  Abdominal:     Palpations: Abdomen is soft.     Tenderness: There is no abdominal tenderness. There is no right CVA tenderness, left CVA tenderness, guarding or rebound.  Musculoskeletal:     Cervical back: Neck supple.  Skin:    General: Skin is warm and dry.  Neurological:     General: No focal deficit present.     Mental Status: She is alert and oriented to person, place, and time.     Gait: Gait normal.  Psychiatric:        Mood and Affect: Mood normal.        Behavior: Behavior normal.      UC Treatments / Results  Labs (all labs ordered are listed, but only abnormal results are displayed) Labs Reviewed  POCT URINALYSIS DIP (MANUAL ENTRY) - Abnormal; Notable for the following components:      Result Value   Clarity, UA cloudy (*)    Ketones, POC UA trace (5) (*)    Blood, UA small (*)    Protein Ur, POC =30 (*)    Leukocytes, UA Moderate (2+) (*)    All other  components within normal limits  URINE CULTURE    EKG   Radiology No results found.  Procedures Procedures  (including critical care time)  Medications Ordered in UC Medications - No data to display  Initial Impression / Assessment and Plan / UC Course  I have reviewed the triage vital signs and the nursing notes.  Pertinent labs & imaging results that were available during my care of the patient were reviewed by me and considered in my medical decision making (see chart for details).   Acute cystitis without hematuria.  Elevated blood pressure reading.  Patient states she does not have history of hypertension.  Urine culture pending.  Treating with Keflex.  Discussed with patient that we will call her if the culture shows the need to change or discontinue the antibiotic.  Discussed with patient that her blood pressure is elevated today and needs to be rechecked by her PCP in 2 to 4 weeks.  She agrees to plan of care.   Final Clinical Impressions(s) / UC Diagnoses   Final diagnoses:  Acute cystitis without hematuria  Elevated blood pressure reading     Discharge Instructions     Take the antibiotic as directed.  The urine culture is pending.  We will call you if it shows the need to change or discontinue your antibiotic.    Your blood pressure is elevated today at 141/103; repeat 136/88.  Please have this rechecked by your primary care provider in 2-4 weeks.         ED Prescriptions    Medication Sig Dispense Auth. Provider   cephALEXin (KEFLEX) 500 MG capsule Take 1 capsule (500 mg total) by mouth 2 (two) times daily for 5 days. 10 capsule Sharion Balloon, NP     PDMP not reviewed this encounter.   Sharion Balloon, NP 05/07/20 1037

## 2020-05-08 DIAGNOSIS — J301 Allergic rhinitis due to pollen: Secondary | ICD-10-CM | POA: Diagnosis not present

## 2020-05-08 DIAGNOSIS — J3089 Other allergic rhinitis: Secondary | ICD-10-CM | POA: Diagnosis not present

## 2020-05-08 DIAGNOSIS — J3081 Allergic rhinitis due to animal (cat) (dog) hair and dander: Secondary | ICD-10-CM | POA: Diagnosis not present

## 2020-05-10 ENCOUNTER — Other Ambulatory Visit: Payer: Self-pay

## 2020-05-10 ENCOUNTER — Ambulatory Visit (INDEPENDENT_AMBULATORY_CARE_PROVIDER_SITE_OTHER): Payer: BC Managed Care – PPO

## 2020-05-10 DIAGNOSIS — E538 Deficiency of other specified B group vitamins: Secondary | ICD-10-CM | POA: Diagnosis not present

## 2020-05-10 LAB — URINE CULTURE: Culture: 80000 — AB

## 2020-05-10 MED ORDER — CYANOCOBALAMIN 1000 MCG/ML IJ SOLN
1000.0000 ug | Freq: Once | INTRAMUSCULAR | Status: AC
  Administered 2020-05-10: 1000 ug via INTRAMUSCULAR

## 2020-05-10 NOTE — Progress Notes (Signed)
Patient presented for B 12 injection to left deltoid, patient voiced no concerns nor showed any signs of distress during injection. 

## 2020-05-15 DIAGNOSIS — J3089 Other allergic rhinitis: Secondary | ICD-10-CM | POA: Diagnosis not present

## 2020-05-15 DIAGNOSIS — J301 Allergic rhinitis due to pollen: Secondary | ICD-10-CM | POA: Diagnosis not present

## 2020-05-22 DIAGNOSIS — J301 Allergic rhinitis due to pollen: Secondary | ICD-10-CM | POA: Diagnosis not present

## 2020-05-22 DIAGNOSIS — J3089 Other allergic rhinitis: Secondary | ICD-10-CM | POA: Diagnosis not present

## 2020-05-29 DIAGNOSIS — J3089 Other allergic rhinitis: Secondary | ICD-10-CM | POA: Diagnosis not present

## 2020-05-29 DIAGNOSIS — J301 Allergic rhinitis due to pollen: Secondary | ICD-10-CM | POA: Diagnosis not present

## 2020-06-04 DIAGNOSIS — N950 Postmenopausal bleeding: Secondary | ICD-10-CM | POA: Diagnosis not present

## 2020-06-05 DIAGNOSIS — J3089 Other allergic rhinitis: Secondary | ICD-10-CM | POA: Diagnosis not present

## 2020-06-05 DIAGNOSIS — J301 Allergic rhinitis due to pollen: Secondary | ICD-10-CM | POA: Diagnosis not present

## 2020-06-11 ENCOUNTER — Ambulatory Visit (INDEPENDENT_AMBULATORY_CARE_PROVIDER_SITE_OTHER): Payer: BC Managed Care – PPO

## 2020-06-11 ENCOUNTER — Other Ambulatory Visit: Payer: Self-pay

## 2020-06-11 DIAGNOSIS — E538 Deficiency of other specified B group vitamins: Secondary | ICD-10-CM

## 2020-06-11 MED ORDER — CYANOCOBALAMIN 1000 MCG/ML IJ SOLN
1000.0000 ug | Freq: Once | INTRAMUSCULAR | Status: AC
  Administered 2020-06-11: 1000 ug via INTRAMUSCULAR

## 2020-06-11 NOTE — Progress Notes (Signed)
Patient presented for B 12 injection to left deltoid, patient voiced no concerns nor showed any signs of distress during injection. 

## 2020-06-12 DIAGNOSIS — J301 Allergic rhinitis due to pollen: Secondary | ICD-10-CM | POA: Diagnosis not present

## 2020-06-12 DIAGNOSIS — J3089 Other allergic rhinitis: Secondary | ICD-10-CM | POA: Diagnosis not present

## 2020-06-19 DIAGNOSIS — J3089 Other allergic rhinitis: Secondary | ICD-10-CM | POA: Diagnosis not present

## 2020-06-19 DIAGNOSIS — J301 Allergic rhinitis due to pollen: Secondary | ICD-10-CM | POA: Diagnosis not present

## 2020-06-26 DIAGNOSIS — J301 Allergic rhinitis due to pollen: Secondary | ICD-10-CM | POA: Diagnosis not present

## 2020-06-26 DIAGNOSIS — J3089 Other allergic rhinitis: Secondary | ICD-10-CM | POA: Diagnosis not present

## 2020-06-28 NOTE — Progress Notes (Signed)
06/29/2020 2:06 PM   Shannon [name] [birth year] [id number]  Referring provider: Einar [name], [address] [place],  [place] [address]  Chief Complaint  Patient presents with  . Recurrent UTI   Urological history: 1. Vaginal atrophy -managed with vaginal estrogen cream once weekly  2. rUTI's -contributing factors of age, vaginal atrophy -positive documented urine cultures over the last year   pansensitive E. coli in January 26, 2020  E. coli resistant to ampicillin and ampicillin/sulbactam in May 07, 2020.   HPI: Shannon [name] is a 55 y.o. female who presents today for bladder issues and rUTI's.  She has had a documented positive urine culture for pansensitive E. coli in January 26, 2020 and E. coli resistant to ampicillin and ampicillin/sulbactam in May 07, 2020.  Her symptoms with a urinary tract infection consist of intense suprapubic pain.   She does not have a history of nephrolithiasis, GU surgery or GU trauma.   She is sexually active.  She has noted a correlation with her urinary tract infections and sexual intercourse.  She is also is having pain with intercourse.  She states the entire act is painful.  She is using an applicator to instill her vaginal estrogen cream once weekly.   This is prescribed by her gynecologist.  She states that she had a recent pelvic ultrasound and it was normal with her gynecologist.  She is postmenopausal.    She is drinking 6-8 bottles of water daily of water daily.   2 to 3 cups of decaffeinated coffee in the morning.  Her UA today is benign.  Her PVR is 0 mL.  She has baseline symptoms of frequency, nocturia x2 and urgency.  She states that she does have urge incontinence if she waits too long to urinate.  Patient denies any modifying or aggravating factors.  Patient denies any gross hematuria, dysuria or suprapubic/flank pain.  Patient denies any fevers, chills, nausea or vomiting.   PMH: Past  Medical History:  Diagnosis Date  . Anemia   . Anxiety   . Breast pain, right 11/07/2012  . Breathing difficulty 06/20/2014  . Environmental allergies   . GERD (gastroesophageal reflux disease)   . Hiatal hernia    small  . History of migraine headaches   . Hyperthyroidism    s/p ablation  . Vaginitis 01/16/2013    Surgical History: Past Surgical History:  Procedure Laterality Date  . BREAST BIOPSY Right 12/05/2015   neg  . CESAREAN SECTION  11/98   Dr Roena Malady  . CHOLECYSTECTOMY  11/05   Dr Pat Patrick  . JOINT REPLACEMENT Left 2017   hip  . NASAL SINUS SURGERY  11/93  . SEPTOPLASTY  5/90   Dr Rossie Muskrat  . TUBAL LIGATION  02/1998  . UMBILICAL HERNIA REPAIR  02/1998    Home Medications:  Allergies as of 06/29/2020      Reactions   Codeine Other (See Comments)   Severe Headache   Decongest-aid [pseudoephedrine] Other (See Comments)   Increased heart rate   Flagyl [metronidazole]    Sulfa Antibiotics       Medication List       Accurate as of June 29, 2020 11:59 PM. If you have any questions, ask your nurse or doctor.        STOP taking these medications   triamcinolone 55 MCG/ACT Aero nasal inhaler Commonly known as: NASACORT Stopped by: Zara Council, PA-C     TAKE these medications  acetaminophen 500 MG tablet Commonly known as: TYLENOL Take 1,000 mg by mouth every 6 (six) hours as needed for moderate pain.   ALPRAZolam 0.25 MG tablet Commonly known as: XANAX TAKE ONE TABLET DAILY IF NEEDED FOR ANXIETY   cholecalciferol 25 MCG (1000 UNIT) tablet Commonly known as: VITAMIN D3 Take 1,000 Units by mouth daily.   EPINEPHrine 0.3 mg/0.3 mL Soaj injection Commonly known as: EPI-PEN   Estriol 10 % Crea by Does not apply route.   famotidine 40 MG tablet Commonly known as: PEPCID Take by mouth.   omeprazole 40 MG capsule Commonly known as: PRILOSEC Take 40 mg by mouth daily.   ondansetron 4 MG disintegrating tablet Commonly known as: Zofran ODT Take  1 tablet (4 mg total) by mouth 2 (two) times daily as needed for nausea or vomiting.   SUMAtriptan 100 MG tablet Commonly known as: IMITREX   Toviaz 8 MG Tb24 tablet Generic drug: fesoterodine Take 1 tablet (8 mg total) by mouth daily. Started by: Zara Council, PA-C   Vitamin B-12 1000 MCG/15ML Liqd Take 1,000 mcg by mouth every 30 (thirty) days.       Allergies:  Allergies  Allergen Reactions  . Codeine Other (See Comments)    Severe Headache  . Decongest-Aid [Pseudoephedrine] Other (See Comments)    Increased heart rate  . Flagyl [Metronidazole]   . Sulfa Antibiotics     Family History: Family History  Problem Relation Age of Onset  . Heart disease Father   . Hypertension Father   . Thyroid disease Father   . Hypercholesterolemia Father   . Kidney cancer Father   . Leukemia Father        hairy cell  . Hypercholesterolemia Mother   . Thyroid disease Mother   . Breast cancer Mother 75  . Breast cancer Other        maternal great grandmother  . Heart disease Maternal Grandfather        MI - age 4  . Prostate cancer Paternal Grandfather   . Pancreatic cancer Paternal Grandfather   . Lung cancer Paternal Grandfather   . Bladder Cancer Neg Hx     Social History:  reports that she quit smoking about 20 years ago. She has never used smokeless tobacco. She reports that she does not drink alcohol and does not use drugs.  ROS: Pertinent ROS in HPI  Physical Exam: BP 127/86   Pulse 78   Ht '5\' 2"'  (1.575 m)   Wt 125 lb (56.7 kg)   LMP 02/12/2013   BMI 22.86 kg/m   Constitutional:  Well nourished. Alert and oriented, No acute distress. HEENT: [place] AT, mask in place.  Trachea midline Cardiovascular: No clubbing, cyanosis, or edema. Respiratory: Normal respiratory effort, no increased work of breathing. Neurologic: Grossly intact, no focal deficits, moving all 4 extremities. Psychiatric: Normal mood and affect.   Laboratory Data: Lab Results  Component Value  Date   WBC 4.8 03/26/2020   HGB 13.3 03/26/2020   HCT 39.1 03/26/2020   MCV 89.4 03/26/2020   PLT 242.0 03/26/2020    Lab Results  Component Value Date   CREATININE 0.62 03/26/2020    Lab Results  Component Value Date   TSH 1.36 03/26/2020       Component Value Date/Time   CHOL 200 03/26/2020 0914   HDL 74.90 03/26/2020 0914   CHOLHDL 3 03/26/2020 0914   VLDL 21.6 03/26/2020 0914   LDLCALC 104 (H) 03/26/2020 0914    Lab  Results  Component Value Date   AST 17 03/26/2020   Lab Results  Component Value Date   ALT 15 03/26/2020    Urinalysis Component     Latest Ref Rng & Units 06/29/2020  Specific Gravity, UA     1.005 - 1.030 1.015  pH, UA     5.0 - 7.5 5.5  Color, UA     Yellow Yellow  Appearance Ur     Clear Clear  Leukocytes,UA     Negative Negative  Protein,UA     Negative/Trace Negative  Glucose, UA     Negative Negative  Ketones, UA     Negative Negative  RBC, UA     Negative Negative  Bilirubin, UA     Negative Negative  Urobilinogen, Ur     0.2 - 1.0 mg/dL 0.2  Nitrite, UA     Negative Negative  Microscopic Examination      See below:   Component     Latest Ref Rng & Units 06/29/2020  WBC, UA     0 - 5 /hpf None seen  RBC     0 - 2 /hpf 0-2  Epithelial Cells (non renal)     0 - 10 /hpf 0-10  Bacteria, UA     None seen/Few Few  I have reviewed the labs.   Pertinent Imaging: Results for PAYAL, STANFORTH (MRN [id number]) as of 06/29/2020 11:44  Ref. Range 06/29/2020 11:11  Scan Result Unknown 0 ml     Assessment & Plan:   1. rUTI's - criteria for recurrent UTI has been met with 2 or more infections in 6 months or 3 or greater infections in one year  - patient is instructed to take probiotics (yogurt, oral pills or vaginal suppositories), take cranberry pills or drink the juice  -RUS to rule out nidus for recurrent UTIs  2. Vaginal atrophy -Instead of using the applicator to apply the vaginal estrogen cream she will use her finger  to get the estrogen at the entrance of the introitus  3. Urgency -We will have a trial of Toviaz 8 mg daily, #28 samples given                                         Return in about 3 weeks (around 07/20/2020) for PVR and OAB questionnaire, RUS report .   These notes generated with voice recognition software. I apologize for typographical errors.  Zara Council, PA-C  [facility] [address]  [place], [place] [address] [phone]

## 2020-06-29 ENCOUNTER — Ambulatory Visit (INDEPENDENT_AMBULATORY_CARE_PROVIDER_SITE_OTHER): Payer: BC Managed Care – PPO | Admitting: Urology

## 2020-06-29 ENCOUNTER — Encounter: Payer: Self-pay | Admitting: Urology

## 2020-06-29 ENCOUNTER — Other Ambulatory Visit: Payer: Self-pay

## 2020-06-29 VITALS — BP 127/86 | HR 78 | Ht 62.0 in | Wt 125.0 lb

## 2020-06-29 DIAGNOSIS — R3915 Urgency of urination: Secondary | ICD-10-CM

## 2020-06-29 DIAGNOSIS — N952 Postmenopausal atrophic vaginitis: Secondary | ICD-10-CM

## 2020-06-29 DIAGNOSIS — N390 Urinary tract infection, site not specified: Secondary | ICD-10-CM | POA: Diagnosis not present

## 2020-06-29 LAB — BLADDER SCAN AMB NON-IMAGING: Scan Result: 0

## 2020-06-29 MED ORDER — TOVIAZ 8 MG PO TB24: 8.0000 mg | ORAL_TABLET | Freq: Every day | ORAL | 0 refills | Status: DC

## 2020-06-30 LAB — URINALYSIS, COMPLETE
Bilirubin, UA: NEGATIVE
Glucose, UA: NEGATIVE
Ketones, UA: NEGATIVE
Leukocytes,UA: NEGATIVE
Nitrite, UA: NEGATIVE
Protein,UA: NEGATIVE
RBC, UA: NEGATIVE
Specific Gravity, UA: 1.015 (ref 1.005–1.030)
Urobilinogen, Ur: 0.2 mg/dL (ref 0.2–1.0)
pH, UA: 5.5 (ref 5.0–7.5)

## 2020-06-30 LAB — MICROSCOPIC EXAMINATION: WBC, UA: NONE SEEN /hpf (ref 0–5)

## 2020-07-02 LAB — CULTURE, URINE COMPREHENSIVE

## 2020-07-03 DIAGNOSIS — J301 Allergic rhinitis due to pollen: Secondary | ICD-10-CM | POA: Diagnosis not present

## 2020-07-03 DIAGNOSIS — J3089 Other allergic rhinitis: Secondary | ICD-10-CM | POA: Diagnosis not present

## 2020-07-10 DIAGNOSIS — J3089 Other allergic rhinitis: Secondary | ICD-10-CM | POA: Diagnosis not present

## 2020-07-10 DIAGNOSIS — J301 Allergic rhinitis due to pollen: Secondary | ICD-10-CM | POA: Diagnosis not present

## 2020-07-12 ENCOUNTER — Ambulatory Visit (INDEPENDENT_AMBULATORY_CARE_PROVIDER_SITE_OTHER): Payer: BC Managed Care – PPO

## 2020-07-12 ENCOUNTER — Other Ambulatory Visit: Payer: Self-pay

## 2020-07-12 DIAGNOSIS — E538 Deficiency of other specified B group vitamins: Secondary | ICD-10-CM

## 2020-07-12 MED ORDER — CYANOCOBALAMIN 1000 MCG/ML IJ SOLN
1000.0000 ug | Freq: Once | INTRAMUSCULAR | Status: AC
  Administered 2020-07-12: 1000 ug via INTRAMUSCULAR

## 2020-07-12 NOTE — Progress Notes (Signed)
Patient presented for B 12 injection to left deltoid, patient voiced no concerns nor showed any signs of distress during injection. 

## 2020-07-17 DIAGNOSIS — J3089 Other allergic rhinitis: Secondary | ICD-10-CM | POA: Diagnosis not present

## 2020-07-17 DIAGNOSIS — J301 Allergic rhinitis due to pollen: Secondary | ICD-10-CM | POA: Diagnosis not present

## 2020-07-24 DIAGNOSIS — J301 Allergic rhinitis due to pollen: Secondary | ICD-10-CM | POA: Diagnosis not present

## 2020-07-24 DIAGNOSIS — J3089 Other allergic rhinitis: Secondary | ICD-10-CM | POA: Diagnosis not present

## 2020-07-25 ENCOUNTER — Ambulatory Visit: Payer: Self-pay | Admitting: Urology

## 2020-07-25 DIAGNOSIS — G5603 Carpal tunnel syndrome, bilateral upper limbs: Secondary | ICD-10-CM | POA: Diagnosis not present

## 2020-07-25 NOTE — Progress Notes (Signed)
07/26/2020 1:53 PM   Shannon [name] 12/03/1965 [id number]  Referring provider: [name], [address] [place],  [place] [address]  Urological history: 1. Vaginal atrophy -managed with vaginal estrogen cream once weekly  2. rUTI's -contributing factors of age, vaginal atrophy -positive documented urine cultures over the last year   pansensitive E. coli in January 26, 2020  E. coli resistant to ampicillin and ampicillin/sulbactam in May 07, 2020  Chief Complaint  Patient presents with   Recurrent UTI   HPI: Shannon [name] is a 55 y.o. female who presents today after a trial of Toviaz 8 mg daily.  She did not start the Toviaz 8 mg.  She is having 8 or more daytime urinations, nocturia x1-2, a strong urge to urinate, urge incontinence that occurs 1-2 times weekly.  She does wear an occasional panty liner for leakage issues.  PVR 22 mL.    She is taking cranberry tablets and probiotics and feels that they are relieving her symptoms.   She is also noticing a difference applying  the vaginal estrogen with her finger tip instead of the applicator.  PMH: Past Medical History:  Diagnosis Date   Anemia    Anxiety    Breast pain, right 11/07/2012   Breathing difficulty 06/20/2014   Environmental allergies    GERD (gastroesophageal reflux disease)    Hiatal hernia    small   History of migraine headaches    Hyperthyroidism    s/p ablation   Vaginitis 01/16/2013    Surgical History: Past Surgical History:  Procedure Laterality Date   BREAST BIOPSY Right 12/05/2015   neg   CESAREAN SECTION  11/98   Dr Roena Malady   CHOLECYSTECTOMY  11/05   Dr Pat Patrick   JOINT REPLACEMENT Left 2017   hip   NASAL SINUS SURGERY  11/93   SEPTOPLASTY  5/90   Dr Rossie Muskrat   TUBAL LIGATION  02/8313   UMBILICAL HERNIA REPAIR  02/1998    Home Medications:  Allergies as of 07/26/2020       Reactions   Codeine Other (See Comments)   Severe Headache    Decongest-aid [pseudoephedrine] Other (See Comments)   Increased heart rate   Flagyl [metronidazole]    Sulfa Antibiotics         Medication List        Accurate as of July 26, 2020  1:53 PM. If you have any questions, ask your nurse or doctor.          acetaminophen 500 MG tablet Commonly known as: TYLENOL Take 1,000 mg by mouth every 6 (six) hours as needed for moderate pain.   ALPRAZolam 0.25 MG tablet Commonly known as: XANAX TAKE ONE TABLET DAILY IF NEEDED FOR ANXIETY   cholecalciferol 25 MCG (1000 UNIT) tablet Commonly known as: VITAMIN D3 Take 1,000 Units by mouth daily.   Cranberry 125 MG Tabs Take by mouth.   EPINEPHrine 0.3 mg/0.3 mL Soaj injection Commonly known as: EPI-PEN   Estriol 10 % Crea by Does not apply route.   famotidine 40 MG tablet Commonly known as: PEPCID Take by mouth.   omeprazole 40 MG capsule Commonly known as: PRILOSEC Take 40 mg by mouth daily.   ondansetron 4 MG disintegrating tablet Commonly known as: Zofran ODT Take 1 tablet (4 mg total) by mouth 2 (two) times daily as needed for nausea or vomiting.   Probiotic Acidophilus Tabs Take by mouth.   SUMAtriptan 100 MG tablet  Commonly known as: IMITREX   Toviaz 8 MG Tb24 tablet Generic drug: fesoterodine Take 1 tablet (8 mg total) by mouth daily.   Vitamin B-12 1000 MCG/15ML Liqd Take 1,000 mcg by mouth every 30 (thirty) days.        Allergies:  Allergies  Allergen Reactions   Codeine Other (See Comments)    Severe Headache   Decongest-Aid [Pseudoephedrine] Other (See Comments)    Increased heart rate   Flagyl [Metronidazole]    Sulfa Antibiotics     Family History: Family History  Problem Relation Age of Onset   Heart disease Father    Hypertension Father    Thyroid disease Father    Hypercholesterolemia Father    Kidney cancer Father    Leukemia Father        hairy cell   Hypercholesterolemia Mother    Thyroid disease Mother    Breast cancer  Mother 55   Breast cancer Other        maternal great grandmother   Heart disease Maternal Grandfather        MI - age 54   Prostate cancer Paternal Grandfather    Pancreatic cancer Paternal Grandfather    Lung cancer Paternal Grandfather    Bladder Cancer Neg Hx     Social History:  reports that she quit smoking about 20 years ago. Her smoking use included cigarettes. She has never used smokeless tobacco. She reports that she does not drink alcohol and does not use drugs.  ROS: Pertinent ROS in HPI  Physical Exam: BP 116/75   Pulse 79   Ht 5\' 2"  (1.575 m)   Wt 125 lb (56.7 kg)   LMP 02/12/2013   BMI 22.86 kg/m   Constitutional:  Well nourished. Alert and oriented, No acute distress. HEENT: [place] AT, mask in place.  Trachea midline Cardiovascular: No clubbing, cyanosis, or edema. Respiratory: Normal respiratory effort, no increased work of breathing. Neurologic: Grossly intact, no focal deficits, moving all 4 extremities. Psychiatric: Normal mood and affect.    Laboratory Data: N/A    Pertinent Imaging: Results for ARLANDA, SHIPLETT (MRN [id number]) as of 07/26/2020 13:52  Ref. Range 07/26/2020 13:37  Scan Result Unknown 22    Assessment & Plan:   1. rUTI's -continue vaginal estrogen cream, cranberry tablets and probiotics -RUS pending - will call with the results  2. Vaginal atrophy -continue applying vaginal estrogen cream with finger tip   3. Urgency -improved with cranberry tablets and probiotics   Return for following up pending RUS results .   These notes generated with voice recognition software. I apologize for typographical errors.  Zara Council, PA-C  [facility] [address]  [place], [place] [address] [phone]

## 2020-07-26 ENCOUNTER — Ambulatory Visit: Payer: BC Managed Care – PPO | Admitting: Urology

## 2020-07-26 ENCOUNTER — Other Ambulatory Visit: Payer: Self-pay

## 2020-07-26 ENCOUNTER — Encounter: Payer: Self-pay | Admitting: Urology

## 2020-07-26 VITALS — BP 116/75 | HR 79 | Ht 62.0 in | Wt 125.0 lb

## 2020-07-26 DIAGNOSIS — R3915 Urgency of urination: Secondary | ICD-10-CM | POA: Diagnosis not present

## 2020-07-26 DIAGNOSIS — N390 Urinary tract infection, site not specified: Secondary | ICD-10-CM

## 2020-07-26 DIAGNOSIS — N952 Postmenopausal atrophic vaginitis: Secondary | ICD-10-CM | POA: Diagnosis not present

## 2020-07-26 LAB — BLADDER SCAN AMB NON-IMAGING: Scan Result: 22

## 2020-07-26 NOTE — Patient Instructions (Signed)
Probiotics to have the ingredients Lactobacillus acidophilus, Lactobacillus casei, lactobacillus bulgaricus and streptococcus thermophiles.

## 2020-07-31 ENCOUNTER — Ambulatory Visit
Admission: RE | Admit: 2020-07-31 | Discharge: 2020-07-31 | Disposition: A | Discharge status: Home / Self Care | Payer: BC Managed Care – PPO | Source: Ambulatory Visit | Attending: Urology | Admitting: Urology

## 2020-07-31 ENCOUNTER — Other Ambulatory Visit: Payer: Self-pay

## 2020-07-31 DIAGNOSIS — J3089 Other allergic rhinitis: Secondary | ICD-10-CM | POA: Diagnosis not present

## 2020-07-31 DIAGNOSIS — J301 Allergic rhinitis due to pollen: Secondary | ICD-10-CM | POA: Diagnosis not present

## 2020-07-31 DIAGNOSIS — N390 Urinary tract infection, site not specified: Secondary | ICD-10-CM

## 2020-08-07 DIAGNOSIS — J301 Allergic rhinitis due to pollen: Secondary | ICD-10-CM | POA: Diagnosis not present

## 2020-08-07 DIAGNOSIS — J3089 Other allergic rhinitis: Secondary | ICD-10-CM | POA: Diagnosis not present

## 2020-08-08 ENCOUNTER — Encounter: Payer: Self-pay | Admitting: Internal Medicine

## 2020-08-09 NOTE — Telephone Encounter (Signed)
Called and spoke to [place]. Hasina states that she has had Head Congestion and Chest Tightness x2days. She declines any SOB when moving around her home or talking. I gave Dr. Lars Mage instruction of 14 day quarantine or 5 days without symptom quarantine. To only leave home when absolutely necessary and wear a mask when doing so. [place] verbalized understanding and had no other questions. I informed her that Dr. Nicki Reaper wanted her to be evaluated for the chest tightness she is experiencing. Gave the information about the [facility] and [facility]. Zhania verbalized understanding and states that she will try to go.

## 2020-08-09 NOTE — Telephone Encounter (Signed)
Please call and confirm pt doing ok and confirm being seen.

## 2020-08-10 ENCOUNTER — Other Ambulatory Visit: Payer: Self-pay

## 2020-08-10 ENCOUNTER — Telehealth: Payer: Self-pay | Admitting: Internal Medicine

## 2020-08-10 ENCOUNTER — Telehealth (INDEPENDENT_AMBULATORY_CARE_PROVIDER_SITE_OTHER): Payer: BC Managed Care – PPO | Admitting: Internal Medicine

## 2020-08-10 DIAGNOSIS — U071 COVID-19: Secondary | ICD-10-CM | POA: Diagnosis not present

## 2020-08-10 DIAGNOSIS — F419 Anxiety disorder, unspecified: Secondary | ICD-10-CM | POA: Diagnosis not present

## 2020-08-10 MED ORDER — LEVALBUTEROL TARTRATE 45 MCG/ACT IN AERO: INHALATION_SPRAY | RESPIRATORY_TRACT | 0 refills | Status: DC

## 2020-08-10 NOTE — Telephone Encounter (Signed)
My chart message sent to pt for update.   

## 2020-08-10 NOTE — Progress Notes (Signed)
Patient ID: Shannon [name], female   DOB: Jul 12, 1965, 55 y.o.   MRN: [id number]   Virtual Visit via video Note  This visit type was conducted due to national recommendations for restrictions regarding the COVID-19 pandemic (e.g. social distancing).  This format is felt to be most appropriate for this patient at this time.  All issues noted in this document were discussed and addressed.  No physical exam was performed (except for noted visual exam findings with Video Visits).   I connected with Shannon [name] today by a video enabled telemedicine application and verified that I am speaking with the correct person using two identifiers. Location patient: home Location provider: work Persons participating in the virtual visit: patient, provider  The limitations, risks, security and privacy concerns of performing an evaluation and management service by video and the availability of in person appointments have been discussed.  It has also been discussed with the patient that there may be a patient responsible charge related to this service. The patient expressed understanding and agreed to proceed.   Reason for visit: work in appt  HPI: Work in - covid positive.  Symptoms started two days ago. Reports - not feeling good.  Noticed head congestion, sneezing and slight headache.  Noticed some chest tightness yesterday.  Noticed a little tightness this am.  Had some coughing, but after up for a brief period - no chest tightness since.  No increased coughing since.  No fever.  Yesterday - left ear - sound like in a barrel.  Sore throat in am.  Some drainage.  No sob.  No chest pain or chest tightness.  No nausea, vomiting or diarrhea.  Taking tylenol and vitamin C. Using afrin.  Eating.  Staying hydrated.    ROS: See pertinent positives and negatives per HPI.  Past Medical History:  Diagnosis Date   Anemia    Anxiety    Breast pain, right 11/07/2012   Breathing difficulty 06/20/2014   Environmental  allergies    GERD (gastroesophageal reflux disease)    Hiatal hernia    small   History of migraine headaches    Hyperthyroidism    s/p ablation   Vaginitis 01/16/2013    Past Surgical History:  Procedure Laterality Date   BREAST BIOPSY Right 12/05/2015   neg   CESAREAN SECTION  11/98   Dr Roena Malady   CHOLECYSTECTOMY  11/05   Dr Pat Patrick   JOINT REPLACEMENT Left 2017   hip   NASAL SINUS SURGERY  11/93   SEPTOPLASTY  5/90   Dr Rossie Muskrat   TUBAL LIGATION  02/5364   UMBILICAL HERNIA REPAIR  02/1998    Family History  Problem Relation Age of Onset   Heart disease Father    Hypertension Father    Thyroid disease Father    Hypercholesterolemia Father    Kidney cancer Father    Leukemia Father        hairy cell   Hypercholesterolemia Mother    Thyroid disease Mother    Breast cancer Mother 48   Breast cancer Other        maternal great grandmother   Heart disease Maternal Grandfather        MI - age 45   Prostate cancer Paternal Grandfather    Pancreatic cancer Paternal Grandfather    Lung cancer Paternal Grandfather    Bladder Cancer Neg Hx     SOCIAL HX: reviewed.    Current Outpatient Medications:    acetaminophen (TYLENOL) 500  MG tablet, Take 1,000 mg by mouth every 6 (six) hours as needed for moderate pain., Disp: , Rfl:    ALPRAZolam (XANAX) 0.25 MG tablet, TAKE ONE TABLET DAILY IF NEEDED FOR ANXIETY, Disp: 30 tablet, Rfl: 1   cholecalciferol (VITAMIN D3) 25 MCG (1000 UNIT) tablet, Take 1,000 Units by mouth daily., Disp: , Rfl:    Cranberry 125 MG TABS, Take by mouth., Disp: , Rfl:    Cyanocobalamin (VITAMIN B-12) 1000 MCG/15ML LIQD, Take 1,000 mcg by mouth every 30 (thirty) days., Disp: , Rfl:    EPINEPHrine 0.3 mg/0.3 mL IJ SOAJ injection, , Disp: , Rfl:    Estriol 10 % CREA, by Does not apply route., Disp: , Rfl:    famotidine (PEPCID) 40 MG tablet, Take by mouth., Disp: , Rfl:    fesoterodine (TOVIAZ) 8 MG TB24 tablet, Take 1 tablet (8 mg total) by mouth  daily., Disp: 28 tablet, Rfl: 0   Lactobacillus (PROBIOTIC ACIDOPHILUS) TABS, Take by mouth., Disp: , Rfl:    levalbuterol (XOPENEX HFA) 45 MCG/ACT inhaler, 1 -2 puffs q 6-8 hours prn, Disp: 1 each, Rfl: 0   omeprazole (PRILOSEC) 40 MG capsule, Take 40 mg by mouth daily., Disp: , Rfl:    ondansetron (ZOFRAN ODT) 4 MG disintegrating tablet, Take 1 tablet (4 mg total) by mouth 2 (two) times daily as needed for nausea or vomiting., Disp: 10 tablet, Rfl: 0   SUMAtriptan (IMITREX) 100 MG tablet, , Disp: , Rfl:   EXAM:  GENERAL: alert, oriented, appears well and in no acute distress  HEENT: atraumatic, conjunttiva clear, no obvious abnormalities on inspection of external nose and ears  NECK: normal movements of the head and neck  LUNGS: on inspection no signs of respiratory distress, breathing rate appears normal, no obvious gross SOB, gasping or wheezing  CV: no obvious cyanosis  PSYCH/NEURO: pleasant and cooperative, no obvious depression or anxiety, speech and thought processing grossly intact  ASSESSMENT AND PLAN:  Discussed the following assessment and plan:  Problem List Items Addressed This Visit     Anxiety    Overall appears to be doing well.  Follow.         COVID-19 virus infection    Tested positive 08/08/20.  No chest tightness now.  No increased cough or congestion currently.  No sob.  Some head congestion.  Can continue afrin nasal spray for a couple of more days.  Saline nasal spray as directed.  Robitussin if needed.  Albuterol inhaler as directed.  Rest.  Fluids.  Discussed quarantine guidelines.  Discussed oral antiviral medications.  Wants to hold.  Follow closely.  Call with update.          Return if symptoms worsen or fail to improve.   I discussed the assessment and treatment plan with the patient. The patient was provided an opportunity to ask questions and all were answered. The patient agreed with the plan and demonstrated an understanding of the  instructions.   The patient was advised to call back or seek an in-person evaluation if the symptoms worsen or if the condition fails to improve as anticipated.   [name], MD

## 2020-08-10 NOTE — Telephone Encounter (Signed)
Patient did not go to urgent care. Confirmed no shortness of breath. She is complaining of a mild cough and sinus symptoms. Patient scheduled for 12:00 to discuss quarantine, etc.

## 2020-08-12 ENCOUNTER — Encounter: Payer: Self-pay | Admitting: Internal Medicine

## 2020-08-12 NOTE — Assessment & Plan Note (Signed)
Tested positive 08/08/20.  No chest tightness now.  No increased cough or congestion currently.  No sob.  Some head congestion.  Can continue afrin nasal spray for a couple of more days.  Saline nasal spray as directed.  Robitussin if needed.  Albuterol inhaler as directed.  Rest.  Fluids.  Discussed quarantine guidelines.  Discussed oral antiviral medications.  Wants to hold.  Follow closely.  Call with update.

## 2020-08-12 NOTE — Assessment & Plan Note (Signed)
Overall appears to be doing well.  Follow.  

## 2020-08-13 ENCOUNTER — Ambulatory Visit: Payer: BC Managed Care – PPO

## 2020-08-21 ENCOUNTER — Ambulatory Visit (INDEPENDENT_AMBULATORY_CARE_PROVIDER_SITE_OTHER): Payer: BC Managed Care – PPO

## 2020-08-21 ENCOUNTER — Other Ambulatory Visit: Payer: Self-pay

## 2020-08-21 DIAGNOSIS — E538 Deficiency of other specified B group vitamins: Secondary | ICD-10-CM | POA: Diagnosis not present

## 2020-08-21 DIAGNOSIS — J3089 Other allergic rhinitis: Secondary | ICD-10-CM | POA: Diagnosis not present

## 2020-08-21 DIAGNOSIS — J301 Allergic rhinitis due to pollen: Secondary | ICD-10-CM | POA: Diagnosis not present

## 2020-08-21 MED ORDER — CYANOCOBALAMIN 1000 MCG/ML IJ SOLN
1000.0000 ug | Freq: Once | INTRAMUSCULAR | Status: AC
  Administered 2020-08-21: 1000 ug via INTRAMUSCULAR

## 2020-08-21 NOTE — Progress Notes (Signed)
Patient presented for B 12 injection to right deltoid, patient voiced no concerns nor showed any signs of distress during injection. 

## 2020-08-28 DIAGNOSIS — J301 Allergic rhinitis due to pollen: Secondary | ICD-10-CM | POA: Diagnosis not present

## 2020-08-28 DIAGNOSIS — J3089 Other allergic rhinitis: Secondary | ICD-10-CM | POA: Diagnosis not present

## 2020-09-04 DIAGNOSIS — J3089 Other allergic rhinitis: Secondary | ICD-10-CM | POA: Diagnosis not present

## 2020-09-04 DIAGNOSIS — J301 Allergic rhinitis due to pollen: Secondary | ICD-10-CM | POA: Diagnosis not present

## 2020-09-10 DIAGNOSIS — H524 Presbyopia: Secondary | ICD-10-CM | POA: Diagnosis not present

## 2020-09-11 DIAGNOSIS — J3089 Other allergic rhinitis: Secondary | ICD-10-CM | POA: Diagnosis not present

## 2020-09-11 DIAGNOSIS — J301 Allergic rhinitis due to pollen: Secondary | ICD-10-CM | POA: Diagnosis not present

## 2020-09-18 DIAGNOSIS — J3089 Other allergic rhinitis: Secondary | ICD-10-CM | POA: Diagnosis not present

## 2020-09-18 DIAGNOSIS — J301 Allergic rhinitis due to pollen: Secondary | ICD-10-CM | POA: Diagnosis not present

## 2020-09-24 ENCOUNTER — Other Ambulatory Visit: Payer: Self-pay

## 2020-09-24 ENCOUNTER — Ambulatory Visit (INDEPENDENT_AMBULATORY_CARE_PROVIDER_SITE_OTHER): Payer: BC Managed Care – PPO

## 2020-09-24 DIAGNOSIS — E538 Deficiency of other specified B group vitamins: Secondary | ICD-10-CM

## 2020-09-24 MED ORDER — CYANOCOBALAMIN 1000 MCG/ML IJ SOLN
1000.0000 ug | Freq: Once | INTRAMUSCULAR | Status: AC
  Administered 2020-09-24: 1000 ug via INTRAMUSCULAR

## 2020-09-24 NOTE — Progress Notes (Signed)
Patient presented for B 12 injection to right deltoid, patient voiced no concerns nor showed any signs of distress during injection. 

## 2020-09-25 DIAGNOSIS — J301 Allergic rhinitis due to pollen: Secondary | ICD-10-CM | POA: Diagnosis not present

## 2020-09-25 DIAGNOSIS — J3089 Other allergic rhinitis: Secondary | ICD-10-CM | POA: Diagnosis not present

## 2020-10-02 ENCOUNTER — Ambulatory Visit: Payer: BC Managed Care – PPO | Admitting: Internal Medicine

## 2020-10-02 ENCOUNTER — Other Ambulatory Visit: Payer: Self-pay

## 2020-10-02 ENCOUNTER — Encounter: Payer: Self-pay | Admitting: Internal Medicine

## 2020-10-02 VITALS — BP 118/80 | HR 92 | Temp 98.9°F | Ht 62.01 in | Wt 127.0 lb

## 2020-10-02 DIAGNOSIS — G479 Sleep disorder, unspecified: Secondary | ICD-10-CM

## 2020-10-02 DIAGNOSIS — F419 Anxiety disorder, unspecified: Secondary | ICD-10-CM | POA: Diagnosis not present

## 2020-10-02 DIAGNOSIS — Z1231 Encounter for screening mammogram for malignant neoplasm of breast: Secondary | ICD-10-CM | POA: Diagnosis not present

## 2020-10-02 DIAGNOSIS — J301 Allergic rhinitis due to pollen: Secondary | ICD-10-CM | POA: Diagnosis not present

## 2020-10-02 DIAGNOSIS — K219 Gastro-esophageal reflux disease without esophagitis: Secondary | ICD-10-CM | POA: Diagnosis not present

## 2020-10-02 MED ORDER — TRAZODONE HCL 50 MG PO TABS: 25.0000 mg | ORAL_TABLET | Freq: Every evening | ORAL | 1 refills | Status: DC | PRN

## 2020-10-02 NOTE — Progress Notes (Signed)
Patient ID: Shannon [name], female   DOB: 11-16-1965, 55 y.o.   MRN: [id number]   Subjective:    Patient ID: Shannon [name], female    DOB: 29-Mar-1965, 55 y.o.   MRN: [id number]  This visit occurred during the SARS-CoV-2 public health emergency.  Safety protocols were in place, including screening questions prior to the visit, additional usage of staff PPE, and extensive cleaning of exam room while observing appropriate contact time as indicated for disinfecting solutions.   Patient here for scheduled follow up.    HPI Here for f/u appt.  Here to f/u regarding increased stress.  Not sleeping.  Has decreased amount of xanax taking.  Last xanax - 01/2020 until sick recently.  Has used a few starting back 7/29 - to help her sleep.  Feels needs something to help her sleep and would like to remain off xanax.  Stays active.  No chest pain or sob reported. Intolerant to melatonin.  Discussed treatment options for sleep.  Using estrace - one time per week.    Past Medical History:  Diagnosis Date   Anemia    Anxiety    Breast pain, right 11/07/2012   Breathing difficulty 06/20/2014   Environmental allergies    GERD (gastroesophageal reflux disease)    Hiatal hernia    small   History of migraine headaches    Hyperthyroidism    s/p ablation   Vaginitis 01/16/2013   Past Surgical History:  Procedure Laterality Date   BREAST BIOPSY Right 12/05/2015   neg   CESAREAN SECTION  11/98   Dr Roena Malady   CHOLECYSTECTOMY  11/05   Dr Pat Patrick   JOINT REPLACEMENT Left 2017   hip   NASAL SINUS SURGERY  11/93   SEPTOPLASTY  5/90   Dr Rossie Muskrat   TUBAL LIGATION  123456   UMBILICAL HERNIA REPAIR  02/1998   Family History  Problem Relation Age of Onset   Heart disease Father    Hypertension Father    Thyroid disease Father    Hypercholesterolemia Father    Kidney cancer Father    Leukemia Father        hairy cell   Hypercholesterolemia Mother    Thyroid disease Mother    Breast cancer Mother 33    Breast cancer Other        maternal great grandmother   Heart disease Maternal Grandfather        MI - age 10   Prostate cancer Paternal Grandfather    Pancreatic cancer Paternal Grandfather    Lung cancer Paternal Grandfather    Bladder Cancer Neg Hx    Social History   Socioeconomic History   Marital status: Married    Spouse name: Not on file   Number of children: 1   Years of education: Not on file   Highest education level: Not on file  Occupational History   Not on file  Tobacco Use   Smoking status: Former    Types: Cigarettes    Quit date: 01/28/2000    Years since quitting: 20.7   Smokeless tobacco: Never  Substance and Sexual Activity   Alcohol use: No    Alcohol/week: 0.0 standard drinks   Drug use: No   Sexual activity: Not on file  Other Topics Concern   Not on file  Social History Narrative   Not on file   Social Determinants of Health   Financial Resource Strain: Not on file  Food Insecurity: Not on file  Transportation Needs: Not on file  Physical Activity: Not on file  Stress: Not on file  Social Connections: Not on file    Review of Systems  Constitutional:  Negative for appetite change and unexpected weight change.  HENT:  Negative for congestion and sinus pressure.   Respiratory:  Negative for cough, chest tightness and shortness of breath.   Cardiovascular:  Negative for chest pain, palpitations and leg swelling.  Gastrointestinal:  Negative for abdominal pain, diarrhea, nausea and vomiting.  Genitourinary:  Negative for difficulty urinating and dysuria.  Musculoskeletal:  Negative for joint swelling and myalgias.  Skin:  Negative for color change and rash.  Neurological:  Negative for dizziness, light-headedness and headaches.  Psychiatric/Behavioral:  Positive for sleep disturbance. Negative for agitation and dysphoric mood.       Objective:     BP 118/80   Pulse 92   Temp 98.9 F (37.2 C)   Ht 5' 2.01" (1.575 m)   Wt 127 lb (57.6  kg)   LMP 02/12/2013   SpO2 95%   BMI 23.22 kg/m  Wt Readings from Last 3 Encounters:  10/02/20 127 lb (57.6 kg)  07/26/20 125 lb (56.7 kg)  06/29/20 125 lb (56.7 kg)    Physical Exam Vitals reviewed.  Constitutional:      General: She is not in acute distress.    Appearance: Normal appearance.  HENT:     Head: Normocephalic and atraumatic.     Right Ear: External ear normal.     Left Ear: External ear normal.  Eyes:     General: No scleral icterus.       Right eye: No discharge.        Left eye: No discharge.     Conjunctiva/sclera: Conjunctivae normal.  Neck:     Thyroid: No thyromegaly.  Cardiovascular:     Rate and Rhythm: Normal rate and regular rhythm.  Pulmonary:     Effort: No respiratory distress.     Breath sounds: Normal breath sounds. No wheezing.  Abdominal:     General: Bowel sounds are normal.     Palpations: Abdomen is soft.     Tenderness: There is no abdominal tenderness.  Musculoskeletal:        General: No swelling or tenderness.     Cervical back: Neck supple. No tenderness.  Lymphadenopathy:     Cervical: No cervical adenopathy.  Skin:    Findings: No erythema or rash.  Neurological:     Mental Status: She is alert.  Psychiatric:        Mood and Affect: Mood normal.        Behavior: Behavior normal.     Outpatient Encounter Medications as of 10/02/2020  Medication Sig   acetaminophen (TYLENOL) 500 MG tablet Take 1,000 mg by mouth every 6 (six) hours as needed for moderate pain.   ALPRAZolam (XANAX) 0.25 MG tablet TAKE ONE TABLET DAILY IF NEEDED FOR ANXIETY   cholecalciferol (VITAMIN D3) 25 MCG (1000 UNIT) tablet Take 1,000 Units by mouth daily.   Cranberry 125 MG TABS Take by mouth.   Cyanocobalamin (VITAMIN B-12) 1000 MCG/15ML LIQD Take 1,000 mcg by mouth every 30 (thirty) days.   EPINEPHrine 0.3 mg/0.3 mL IJ SOAJ injection    Estriol 10 % CREA by Does not apply route.   famotidine (PEPCID) 40 MG tablet Take by mouth.   omeprazole  (PRILOSEC) 40 MG capsule Take 40 mg by mouth daily.   ondansetron (ZOFRAN ODT) 4 MG disintegrating tablet  Take 1 tablet (4 mg total) by mouth 2 (two) times daily as needed for nausea or vomiting.   SUMAtriptan (IMITREX) 100 MG tablet    traZODone (DESYREL) 50 MG tablet Take 0.5-1 tablets (25-50 mg total) by mouth at bedtime as needed for sleep.   [DISCONTINUED] fesoterodine (TOVIAZ) 8 MG TB24 tablet Take 1 tablet (8 mg total) by mouth daily. (Patient not taking: Reported on 10/02/2020)   [DISCONTINUED] Lactobacillus (PROBIOTIC ACIDOPHILUS) TABS Take by mouth. (Patient not taking: Reported on 10/02/2020)   [DISCONTINUED] levalbuterol (XOPENEX HFA) 45 MCG/ACT inhaler 1 -2 puffs q 6-8 hours prn (Patient not taking: Reported on 10/02/2020)   No facility-administered encounter medications on file as of 10/02/2020.     Lab Results  Component Value Date   WBC 4.8 03/26/2020   HGB 13.3 03/26/2020   HCT 39.1 03/26/2020   PLT 242.0 03/26/2020   GLUCOSE 78 03/26/2020   CHOL 200 03/26/2020   TRIG 108.0 03/26/2020   HDL 74.90 03/26/2020   LDLCALC 104 (H) 03/26/2020   ALT 15 03/26/2020   AST 17 03/26/2020   NA 141 03/26/2020   K 4.0 03/26/2020   CL 105 03/26/2020   CREATININE 0.62 03/26/2020   BUN 10 03/26/2020   CO2 28 03/26/2020   TSH 1.36 03/26/2020    US RENAL  Result Date: 07/31/2020 CLINICAL DATA:  Recurrent UTI EXAM: RENAL / URINARY TRACT ULTRASOUND COMPLETE COMPARISON:  CT 07/28/2017 FINDINGS: Right Kidney: Renal measurements: 9.8 x 4.2 x 4.4 cm = volume: 96 mL. Echogenicity within normal limits. Prominent right extrarenal pelvis without significant hydronephrosis Left Kidney: Renal measurements: 10.8 x 5.7 x 5 cm = volume: 163.2 mL. Echogenicity within normal limits. No mass or hydronephrosis visualized. Bladder: Appears normal for degree of bladder distention. Other: None. IMPRESSION: Negative renal ultrasound Electronically Signed   By: Donavan Foil M.D.   On: 07/31/2020 22:50        Assessment & Plan:   Problem List Items Addressed This Visit     Anxiety    Not sleeping.  Discussed.  Has been off xanax.  Discussed treatment options.  Treat sleep issue.  Discussed trazodone and hydroxyzine.  Would like to avoid daily xanax.  Follow.       Relevant Medications   traZODone (DESYREL) 50 MG tablet   GERD (gastroesophageal reflux disease)    Saw GI.  Stable.  On prilosec.       Sleep difficulties    Discussed sleep issues.  Exercising.  Discussed treatment options - including trazodone, hydroxyzine.  Would like to avoid xanax.  Agreed trial of trazodone.  Start low dose.  Call with update.  Titrate if needed.        Other Visit Diagnoses     Encounter for screening mammogram for malignant neoplasm of breast    -  Primary   Relevant Orders   MM 3D SCREEN BREAST BILATERAL        [name], MD

## 2020-10-07 ENCOUNTER — Encounter: Payer: Self-pay | Admitting: Internal Medicine

## 2020-10-07 DIAGNOSIS — G479 Sleep disorder, unspecified: Secondary | ICD-10-CM | POA: Insufficient documentation

## 2020-10-07 NOTE — Assessment & Plan Note (Signed)
Not sleeping.  Discussed.  Has been off xanax.  Discussed treatment options.  Treat sleep issue.  Discussed trazodone and hydroxyzine.  Would like to avoid daily xanax.  Follow.

## 2020-10-07 NOTE — Assessment & Plan Note (Signed)
Saw GI.  Stable.  On prilosec.

## 2020-10-07 NOTE — Assessment & Plan Note (Signed)
Discussed sleep issues.  Exercising.  Discussed treatment options - including trazodone, hydroxyzine.  Would like to avoid xanax.  Agreed trial of trazodone.  Start low dose.  Call with update.  Titrate if needed.

## 2020-10-09 ENCOUNTER — Encounter: Payer: Self-pay | Admitting: Internal Medicine

## 2020-10-09 DIAGNOSIS — J301 Allergic rhinitis due to pollen: Secondary | ICD-10-CM | POA: Diagnosis not present

## 2020-10-10 DIAGNOSIS — J301 Allergic rhinitis due to pollen: Secondary | ICD-10-CM | POA: Diagnosis not present

## 2020-10-10 NOTE — Telephone Encounter (Signed)
Please put her on the schedule at 7:00 in the morning.  Thanks

## 2020-10-11 ENCOUNTER — Other Ambulatory Visit: Payer: Self-pay

## 2020-10-11 ENCOUNTER — Telehealth: Payer: Self-pay | Admitting: Internal Medicine

## 2020-10-11 ENCOUNTER — Ambulatory Visit: Payer: BC Managed Care – PPO | Admitting: Internal Medicine

## 2020-10-11 DIAGNOSIS — N644 Mastodynia: Secondary | ICD-10-CM | POA: Insufficient documentation

## 2020-10-11 DIAGNOSIS — J3089 Other allergic rhinitis: Secondary | ICD-10-CM | POA: Diagnosis not present

## 2020-10-11 NOTE — Progress Notes (Signed)
Patient ID: Shannon [name], female   DOB: 01-30-1965, 55 y.o.   MRN: [id number]   Subjective:    Patient ID: Shannon [name], female    DOB: [birth year], 55 y.o.   MRN: [id number]  This visit occurred during the SARS-CoV-2 public health emergency.  Safety protocols were in place, including screening questions prior to the visit, additional usage of staff PPE, and extensive cleaning of exam room while observing appropriate contact time as indicated for disinfecting solutions.   Patient here for  Chief Complaint  Patient presents with   Breast Pain    Bilateral   .   HPI Here as a work in with concerns regarding breast tenderness.  First noticed 10/06/20 - nipple burning.  Then developed pain inferior breast - right.  Has now developed discomfort - lower left breast.  No nipple discharge.  No rash.  No palpable nodule.  Started new exercise through acupuncture.     Past Medical History:  Diagnosis Date   Anemia    Anxiety    Breast pain, right 11/07/2012   Breathing difficulty 06/20/2014   Environmental allergies    GERD (gastroesophageal reflux disease)    Hiatal hernia    small   History of migraine headaches    Hyperthyroidism    s/p ablation   Vaginitis 01/16/2013   Past Surgical History:  Procedure Laterality Date   BREAST BIOPSY Right 12/05/2015   neg   CESAREAN SECTION  11/98   Dr Roena Malady   CHOLECYSTECTOMY  11/05   Dr Pat Patrick   JOINT REPLACEMENT Left 2017   hip   NASAL SINUS SURGERY  11/93   SEPTOPLASTY  5/90   Dr Rossie Muskrat   TUBAL LIGATION  123456   UMBILICAL HERNIA REPAIR  02/1998   Family History  Problem Relation Age of Onset   Heart disease Father    Hypertension Father    Thyroid disease Father    Hypercholesterolemia Father    Kidney cancer Father    Leukemia Father        hairy cell   Hypercholesterolemia Mother    Thyroid disease Mother    Breast cancer Mother 14   Breast cancer Other        maternal great grandmother   Heart disease Maternal  Grandfather        MI - age 35   Prostate cancer Paternal Grandfather    Pancreatic cancer Paternal Grandfather    Lung cancer Paternal Grandfather    Bladder Cancer Neg Hx    Social History   Socioeconomic History   Marital status: Married    Spouse name: Not on file   Number of children: 1   Years of education: Not on file   Highest education level: Not on file  Occupational History   Not on file  Tobacco Use   Smoking status: Former    Types: Cigarettes    Quit date: 01/28/2000    Years since quitting: 20.7   Smokeless tobacco: Never  Substance and Sexual Activity   Alcohol use: No    Alcohol/week: 0.0 standard drinks   Drug use: No   Sexual activity: Not on file  Other Topics Concern   Not on file  Social History Narrative   Not on file   Social Determinants of Health   Financial Resource Strain: Not on file  Food Insecurity: Not on file  Transportation Needs: Not on file  Physical Activity: Not on file  Stress: Not on file  Social Connections: Not on file     Review of Systems  Constitutional:  Negative for appetite change and unexpected weight change.  HENT:  Negative for congestion and sinus pressure.   Respiratory:  Negative for cough, chest tightness and shortness of breath.   Cardiovascular:  Negative for chest pain, palpitations and leg swelling.  Gastrointestinal:  Negative for abdominal pain, diarrhea, nausea and vomiting.  Genitourinary:  Negative for difficulty urinating and dysuria.  Musculoskeletal:  Negative for joint swelling and myalgias.  Skin:  Negative for color change and rash.  Neurological:  Negative for dizziness, light-headedness and headaches.  Psychiatric/Behavioral:  Negative for agitation and dysphoric mood.       Objective:     BP 110/70   Pulse 80   Temp 97.6 F (36.4 C)   Resp 16   Ht '5\' 2"'$  (1.575 m)   Wt 126 lb 9.6 oz (57.4 kg)   LMP 02/12/2013   SpO2 99%   BMI 23.16 kg/m  Wt Readings from Last 3 Encounters:   10/11/20 126 lb 9.6 oz (57.4 kg)  10/02/20 127 lb (57.6 kg)  07/26/20 125 lb (56.7 kg)    Physical Exam Vitals reviewed.  Constitutional:      General: She is not in acute distress.    Appearance: Normal appearance.  HENT:     Head: Normocephalic and atraumatic.     Right Ear: External ear normal.     Left Ear: External ear normal.  Eyes:     General: No scleral icterus.       Right eye: No discharge.        Left eye: No discharge.     Conjunctiva/sclera: Conjunctivae normal.  Neck:     Thyroid: No thyromegaly.  Cardiovascular:     Rate and Rhythm: Normal rate and regular rhythm.  Pulmonary:     Effort: No respiratory distress.     Breath sounds: Normal breath sounds. No wheezing.     Comments: Breasts:  no nipple discharge or nipple retraction present.  No rash.  No redness.  Could not appreciate distinct nodule or axillary adenopathy.  Minimal discomfort - 6:00 right and left breast.  Abdominal:     General: Bowel sounds are normal.     Palpations: Abdomen is soft.     Tenderness: There is no abdominal tenderness.  Musculoskeletal:        General: No swelling or tenderness.     Cervical back: Neck supple. No tenderness.  Lymphadenopathy:     Cervical: No cervical adenopathy.  Skin:    Findings: No erythema or rash.  Neurological:     Mental Status: She is alert.  Psychiatric:        Mood and Affect: Mood normal.        Behavior: Behavior normal.     Outpatient Encounter Medications as of 10/11/2020  Medication Sig   acetaminophen (TYLENOL) 500 MG tablet Take 1,000 mg by mouth every 6 (six) hours as needed for moderate pain.   ALPRAZolam (XANAX) 0.25 MG tablet TAKE ONE TABLET DAILY IF NEEDED FOR ANXIETY   cholecalciferol (VITAMIN D3) 25 MCG (1000 UNIT) tablet Take 1,000 Units by mouth daily.   Cranberry 125 MG TABS Take by mouth.   Cyanocobalamin (VITAMIN B-12) 1000 MCG/15ML LIQD Take 1,000 mcg by mouth every 30 (thirty) days.   EPINEPHrine 0.3 mg/0.3 mL IJ SOAJ  injection    Estriol 10 % CREA by Does not apply route.   famotidine (PEPCID) 40 MG  tablet Take by mouth.   omeprazole (PRILOSEC) 40 MG capsule Take 40 mg by mouth daily.   ondansetron (ZOFRAN ODT) 4 MG disintegrating tablet Take 1 tablet (4 mg total) by mouth 2 (two) times daily as needed for nausea or vomiting.   SUMAtriptan (IMITREX) 100 MG tablet    traZODone (DESYREL) 50 MG tablet Take 0.5-1 tablets (25-50 mg total) by mouth at bedtime as needed for sleep.   No facility-administered encounter medications on file as of 10/11/2020.     Lab Results  Component Value Date   WBC 4.8 03/26/2020   HGB 13.3 03/26/2020   HCT 39.1 03/26/2020   PLT 242.0 03/26/2020   GLUCOSE 78 03/26/2020   CHOL 200 03/26/2020   TRIG 108.0 03/26/2020   HDL 74.90 03/26/2020   LDLCALC 104 (H) 03/26/2020   ALT 15 03/26/2020   AST 17 03/26/2020   NA 141 03/26/2020   K 4.0 03/26/2020   CL 105 03/26/2020   CREATININE 0.62 03/26/2020   BUN 10 03/26/2020   CO2 28 03/26/2020   TSH 1.36 03/26/2020    US RENAL  Result Date: 07/31/2020 CLINICAL DATA:  Recurrent UTI EXAM: RENAL / URINARY TRACT ULTRASOUND COMPLETE COMPARISON:  CT 07/28/2017 FINDINGS: Right Kidney: Renal measurements: 9.8 x 4.2 x 4.4 cm = volume: 96 mL. Echogenicity within normal limits. Prominent right extrarenal pelvis without significant hydronephrosis Left Kidney: Renal measurements: 10.8 x 5.7 x 5 cm = volume: 163.2 mL. Echogenicity within normal limits. No mass or hydronephrosis visualized. Bladder: Appears normal for degree of bladder distention. Other: None. IMPRESSION: Negative renal ultrasound Electronically Signed   By: Donavan Foil M.D.   On: 07/31/2020 22:50       Assessment & Plan:   Problem List Items Addressed This Visit     Breast pain    Breast pain and change in sensation over nipple as outlined.  Exam as outlined.  Discussed.  Given family history, check diagnostic mammogram and possible ultrasound.  Further w/up pending  results.  Possible msk origin - given new exercise.  Follow.        Relevant Orders   MM DIAG BREAST TOMO BILATERAL (Completed)   US BREAST LTD UNI LEFT INC AXILLA (Completed)   US BREAST LTD UNI RIGHT INC AXILLA (Completed)     [name], MD

## 2020-10-11 NOTE — Telephone Encounter (Signed)
Lft pt vm for pt to call ofc to sch Diag mammo and [place]. Thanks

## 2020-10-12 ENCOUNTER — Telehealth: Payer: Self-pay | Admitting: Podiatry

## 2020-10-12 DIAGNOSIS — Q666 Other congenital valgus deformities of feet: Secondary | ICD-10-CM | POA: Diagnosis not present

## 2020-10-12 DIAGNOSIS — M2021 Hallux rigidus, right foot: Secondary | ICD-10-CM | POA: Diagnosis not present

## 2020-10-12 NOTE — Telephone Encounter (Signed)
Pt left message asking about ordering another pair of orthotics.  I returned call and left message for pt to call to discuss further.

## 2020-10-12 NOTE — Telephone Encounter (Signed)
Pt returned call and left message and I called her back and she wants a pair of orthotics made just like the last ones and wants [place] to bill the insurance. She stated if they don't pay she will pay when she picks them up.  I told her I would get them ordered.

## 2020-10-16 DIAGNOSIS — J301 Allergic rhinitis due to pollen: Secondary | ICD-10-CM | POA: Diagnosis not present

## 2020-10-16 DIAGNOSIS — J3089 Other allergic rhinitis: Secondary | ICD-10-CM | POA: Diagnosis not present

## 2020-10-17 ENCOUNTER — Ambulatory Visit
Admission: RE | Admit: 2020-10-17 | Discharge: 2020-10-17 | Disposition: A | Discharge status: Home / Self Care | Payer: BC Managed Care – PPO | Source: Ambulatory Visit | Attending: Internal Medicine | Admitting: Internal Medicine

## 2020-10-17 ENCOUNTER — Other Ambulatory Visit: Payer: Self-pay

## 2020-10-17 DIAGNOSIS — N644 Mastodynia: Secondary | ICD-10-CM

## 2020-10-17 DIAGNOSIS — R922 Inconclusive mammogram: Secondary | ICD-10-CM | POA: Diagnosis not present

## 2020-10-18 ENCOUNTER — Telehealth: Payer: Self-pay | Admitting: *Deleted

## 2020-10-18 NOTE — Telephone Encounter (Signed)
Patient calling back in. Patient informed and verbalized understanding.

## 2020-10-18 NOTE — Telephone Encounter (Signed)
Left message to call office

## 2020-10-18 NOTE — Telephone Encounter (Signed)
-----   Message from [name], MD sent at 10/18/2020  4:41 AM EDT ----- Please call and notify Ms Hardiman that I reviewed her mammogram/ultrasound report.  Mammogram/ultrasound - ok.  They recommended continuing screening mammogram.  If persistent pain, will need to be reevaluated.

## 2020-10-20 ENCOUNTER — Encounter: Payer: Self-pay | Admitting: Internal Medicine

## 2020-10-20 NOTE — Assessment & Plan Note (Signed)
Breast pain and change in sensation over nipple as outlined.  Exam as outlined.  Discussed.  Given family history, check diagnostic mammogram and possible ultrasound.  Further w/up pending results.  Possible msk origin - given new exercise.  Follow.

## 2020-10-22 DIAGNOSIS — D2271 Melanocytic nevi of right lower limb, including hip: Secondary | ICD-10-CM | POA: Diagnosis not present

## 2020-10-22 DIAGNOSIS — D225 Melanocytic nevi of trunk: Secondary | ICD-10-CM | POA: Diagnosis not present

## 2020-10-22 DIAGNOSIS — C44319 Basal cell carcinoma of skin of other parts of face: Secondary | ICD-10-CM | POA: Diagnosis not present

## 2020-10-22 DIAGNOSIS — D2262 Melanocytic nevi of left upper limb, including shoulder: Secondary | ICD-10-CM | POA: Diagnosis not present

## 2020-10-22 DIAGNOSIS — D2261 Melanocytic nevi of right upper limb, including shoulder: Secondary | ICD-10-CM | POA: Diagnosis not present

## 2020-10-22 DIAGNOSIS — D485 Neoplasm of uncertain behavior of skin: Secondary | ICD-10-CM | POA: Diagnosis not present

## 2020-10-23 DIAGNOSIS — J301 Allergic rhinitis due to pollen: Secondary | ICD-10-CM | POA: Diagnosis not present

## 2020-10-23 DIAGNOSIS — J3089 Other allergic rhinitis: Secondary | ICD-10-CM | POA: Diagnosis not present

## 2020-10-24 ENCOUNTER — Encounter: Payer: Self-pay | Admitting: Internal Medicine

## 2020-10-25 ENCOUNTER — Ambulatory Visit (INDEPENDENT_AMBULATORY_CARE_PROVIDER_SITE_OTHER): Payer: BC Managed Care – PPO

## 2020-10-25 ENCOUNTER — Other Ambulatory Visit: Payer: Self-pay

## 2020-10-25 DIAGNOSIS — E538 Deficiency of other specified B group vitamins: Secondary | ICD-10-CM | POA: Diagnosis not present

## 2020-10-25 MED ORDER — CYANOCOBALAMIN 1000 MCG/ML IJ SOLN
1000.0000 ug | Freq: Once | INTRAMUSCULAR | Status: AC
  Administered 2020-10-25: 1000 ug via INTRAMUSCULAR

## 2020-10-25 NOTE — Progress Notes (Signed)
Patient presented for B 12 injection to left deltoid, patient voiced no concerns nor showed any signs of distress during injection. 

## 2020-11-05 ENCOUNTER — Encounter: Payer: Self-pay | Admitting: Internal Medicine

## 2020-11-06 ENCOUNTER — Ambulatory Visit: Payer: BC Managed Care – PPO | Admitting: Family

## 2020-11-06 ENCOUNTER — Encounter: Payer: Self-pay | Admitting: Family

## 2020-11-06 ENCOUNTER — Other Ambulatory Visit: Payer: Self-pay

## 2020-11-06 VITALS — BP 130/82 | HR 90 | Temp 96.6°F | Ht 62.01 in | Wt 126.6 lb

## 2020-11-06 DIAGNOSIS — B029 Zoster without complications: Secondary | ICD-10-CM

## 2020-11-06 DIAGNOSIS — J3089 Other allergic rhinitis: Secondary | ICD-10-CM | POA: Diagnosis not present

## 2020-11-06 DIAGNOSIS — J301 Allergic rhinitis due to pollen: Secondary | ICD-10-CM | POA: Diagnosis not present

## 2020-11-06 MED ORDER — GABAPENTIN 300 MG PO CAPS: 300.0000 mg | ORAL_CAPSULE | Freq: Two times a day (BID) | ORAL | 0 refills | Status: DC | PRN

## 2020-11-06 NOTE — Progress Notes (Signed)
Acute Office Visit  Subjective:    Patient ID: Shannon [name], female    DOB: April 21, 1965, 55 y.o.   MRN: [id number]  Chief Complaint  Patient presents with   Insect Bite    Pt states she has a bump on her back that resembles a spider bite that has been going on for two weeks. Pt states it has been itchy, burning and throbbing     HPI Patient is in today with concerns of a spider bite to the right buttocks x 2 weeks. Patient reports the pain being a burning and tingling sensation and sometimes feels a sharp pain in her right hip. Has not taken any medication for relief. Has been under more stress recently with her husband going in for knee surgery.   Past Medical History:  Diagnosis Date   Anemia    Anxiety    Breast pain, right 11/07/2012   Breathing difficulty 06/20/2014   Environmental allergies    GERD (gastroesophageal reflux disease)    Hiatal hernia    small   History of migraine headaches    Hyperthyroidism    s/p ablation   Vaginitis 01/16/2013    Past Surgical History:  Procedure Laterality Date   BREAST BIOPSY Right 12/05/2015   neg   CESAREAN SECTION  11/98   Dr Roena Malady   CHOLECYSTECTOMY  11/05   Dr Pat Patrick   JOINT REPLACEMENT Left 2017   hip   NASAL SINUS SURGERY  11/93   SEPTOPLASTY  5/90   Dr Rossie Muskrat   TUBAL LIGATION  07/9022   UMBILICAL HERNIA REPAIR  02/1998    Family History  Problem Relation Age of Onset   Heart disease Father    Hypertension Father    Thyroid disease Father    Hypercholesterolemia Father    Kidney cancer Father    Leukemia Father        hairy cell   Hypercholesterolemia Mother    Thyroid disease Mother    Breast cancer Mother 1   Breast cancer Other        maternal great grandmother   Heart disease Maternal Grandfather        MI - age 10   Prostate cancer Paternal Grandfather    Pancreatic cancer Paternal Grandfather    Lung cancer Paternal Grandfather    Bladder Cancer Neg Hx     Social History   Socioeconomic  History   Marital status: Married    Spouse name: Not on file   Number of children: 1   Years of education: Not on file   Highest education level: Not on file  Occupational History   Not on file  Tobacco Use   Smoking status: Former    Types: Cigarettes    Quit date: 01/28/2000    Years since quitting: 20.7   Smokeless tobacco: Never  Substance and Sexual Activity   Alcohol use: No    Alcohol/week: 0.0 standard drinks   Drug use: No   Sexual activity: Not on file  Other Topics Concern   Not on file  Social History Narrative   Not on file   Social Determinants of Health   Financial Resource Strain: Not on file  Food Insecurity: Not on file  Transportation Needs: Not on file  Physical Activity: Not on file  Stress: Not on file  Social Connections: Not on file  Intimate Partner Violence: Not on file    Outpatient Medications Prior to Visit  Medication Sig Dispense Refill  acetaminophen (TYLENOL) 500 MG tablet Take 1,000 mg by mouth every 6 (six) hours as needed for moderate pain.     ALPRAZolam (XANAX) 0.25 MG tablet TAKE ONE TABLET DAILY IF NEEDED FOR ANXIETY 30 tablet 1   cholecalciferol (VITAMIN D3) 25 MCG (1000 UNIT) tablet Take 1,000 Units by mouth daily.     Cranberry 125 MG TABS Take by mouth.     Cyanocobalamin (VITAMIN B-12) 1000 MCG/15ML LIQD Take 1,000 mcg by mouth every 30 (thirty) days.     EPINEPHrine 0.3 mg/0.3 mL IJ SOAJ injection      Estriol 10 % CREA by Does not apply route.     famotidine (PEPCID) 40 MG tablet Take by mouth.     omeprazole (PRILOSEC) 40 MG capsule Take 40 mg by mouth daily.     ondansetron (ZOFRAN ODT) 4 MG disintegrating tablet Take 1 tablet (4 mg total) by mouth 2 (two) times daily as needed for nausea or vomiting. 10 tablet 0   SUMAtriptan (IMITREX) 100 MG tablet      traZODone (DESYREL) 50 MG tablet Take 0.5-1 tablets (25-50 mg total) by mouth at bedtime as needed for sleep. 30 tablet 1   No facility-administered medications prior  to visit.    Allergies  Allergen Reactions   Codeine Other (See Comments)    Severe Headache   Decongest-Aid [Pseudoephedrine] Other (See Comments)    Increased heart rate   Flagyl [Metronidazole]    Sulfa Antibiotics     Review of Systems  Respiratory: Negative.    Cardiovascular: Negative.   Musculoskeletal:  Positive for arthralgias.       Right hip pain  Skin:  Positive for rash.       Painful lesions on the right buttock  Neurological: Negative.        Burning and tingling to the right buttocks  Psychiatric/Behavioral: Negative.    All other systems reviewed and are negative.     Objective:    Physical Exam Vitals reviewed.  Constitutional:      Appearance: Normal appearance.  Cardiovascular:     Rate and Rhythm: Normal rate and regular rhythm.  Pulmonary:     Effort: Pulmonary effort is normal.     Breath sounds: Normal breath sounds.  Musculoskeletal:     Cervical back: Normal range of motion and neck supple.  Skin:    General: Skin is warm and dry.     Findings: Rash present.     Comments: Healing vesicular rash to the right buttocks. Tender to palpation.   Neurological:     General: No focal deficit present.     Mental Status: She is alert and oriented to person, place, and time.  Psychiatric:        Mood and Affect: Mood normal.        Behavior: Behavior normal.    BP 130/82   Pulse 90   Temp (!) 96.6 F (35.9 C)   Ht 5' 2.01" (1.575 m)   Wt 126 lb 9.6 oz (57.4 kg)   LMP 02/12/2013   SpO2 97%   BMI 23.15 kg/m  Wt Readings from Last 3 Encounters:  11/06/20 126 lb 9.6 oz (57.4 kg)  10/11/20 126 lb 9.6 oz (57.4 kg)  10/02/20 127 lb (57.6 kg)    Health Maintenance Due  Topic Date Due   HIV Screening  Never done   Hepatitis C Screening  Never done   COVID-19 Vaccine (4 - Booster for Moderna series) 04/24/2020  There are no preventive care reminders to display for this patient.   Lab Results  Component Value Date   TSH 1.36  03/26/2020   Lab Results  Component Value Date   WBC 4.8 03/26/2020   HGB 13.3 03/26/2020   HCT 39.1 03/26/2020   MCV 89.4 03/26/2020   PLT 242.0 03/26/2020   Lab Results  Component Value Date   NA 141 03/26/2020   K 4.0 03/26/2020   CO2 28 03/26/2020   GLUCOSE 78 03/26/2020   BUN 10 03/26/2020   CREATININE 0.62 03/26/2020   BILITOT 0.4 03/26/2020   ALKPHOS 70 03/26/2020   AST 17 03/26/2020   ALT 15 03/26/2020   PROT 6.7 03/26/2020   ALBUMIN 4.2 03/26/2020   CALCIUM 9.7 03/26/2020   ANIONGAP 9 05/02/2014   GFR 99.47 03/26/2020   Lab Results  Component Value Date   CHOL 200 03/26/2020   Lab Results  Component Value Date   HDL 74.90 03/26/2020   Lab Results  Component Value Date   LDLCALC 104 (H) 03/26/2020   Lab Results  Component Value Date   TRIG 108.0 03/26/2020   Lab Results  Component Value Date   CHOLHDL 3 03/26/2020   No results found for: HGBA1C     Assessment & Plan:   Problem List Items Addressed This Visit   None Visit Diagnoses     Herpes zoster without complication    -  Primary        Meds ordered this encounter  Medications   gabapentin (NEURONTIN) 300 MG capsule    Sig: Take 1 capsule (300 mg total) by mouth 2 (two) times daily as needed.    Dispense:  30 capsule    Refill:  0    Call the office if symptoms worsen or persist. Recheck as scheduled and sooner as needed.  Kennyth Arnold, FNP

## 2020-11-12 ENCOUNTER — Other Ambulatory Visit: Payer: Self-pay | Admitting: Internal Medicine

## 2020-11-13 NOTE — Telephone Encounter (Signed)
Rx ok'd for xanax #30 with no refills.  ?

## 2020-11-13 NOTE — Telephone Encounter (Signed)
[facility] Refill:xanax Last Seen:10-11-20 Last ordered:02-20-20 Next Appt:11-29-20

## 2020-11-15 DIAGNOSIS — J301 Allergic rhinitis due to pollen: Secondary | ICD-10-CM | POA: Diagnosis not present

## 2020-11-15 DIAGNOSIS — J3089 Other allergic rhinitis: Secondary | ICD-10-CM | POA: Diagnosis not present

## 2020-11-20 DIAGNOSIS — J301 Allergic rhinitis due to pollen: Secondary | ICD-10-CM | POA: Diagnosis not present

## 2020-11-20 DIAGNOSIS — J3081 Allergic rhinitis due to animal (cat) (dog) hair and dander: Secondary | ICD-10-CM | POA: Diagnosis not present

## 2020-11-20 DIAGNOSIS — J3089 Other allergic rhinitis: Secondary | ICD-10-CM | POA: Diagnosis not present

## 2020-11-26 ENCOUNTER — Other Ambulatory Visit: Payer: Self-pay

## 2020-11-26 ENCOUNTER — Ambulatory Visit (INDEPENDENT_AMBULATORY_CARE_PROVIDER_SITE_OTHER): Payer: BC Managed Care – PPO

## 2020-11-26 DIAGNOSIS — E538 Deficiency of other specified B group vitamins: Secondary | ICD-10-CM

## 2020-11-26 MED ORDER — CYANOCOBALAMIN 1000 MCG/ML IJ SOLN
1000.0000 ug | Freq: Once | INTRAMUSCULAR | Status: AC
  Administered 2020-11-26: 1000 ug via INTRAMUSCULAR

## 2020-11-26 NOTE — Progress Notes (Signed)
Patient presented for B 12 injection to left deltoid, patient voiced no concerns nor showed any signs of distress during injection. 

## 2020-11-27 DIAGNOSIS — J3089 Other allergic rhinitis: Secondary | ICD-10-CM | POA: Diagnosis not present

## 2020-11-27 DIAGNOSIS — J301 Allergic rhinitis due to pollen: Secondary | ICD-10-CM | POA: Diagnosis not present

## 2020-11-28 DIAGNOSIS — C44319 Basal cell carcinoma of skin of other parts of face: Secondary | ICD-10-CM | POA: Diagnosis not present

## 2020-11-29 ENCOUNTER — Encounter: Payer: Self-pay | Admitting: Internal Medicine

## 2020-11-29 ENCOUNTER — Ambulatory Visit: Payer: BC Managed Care – PPO | Admitting: Internal Medicine

## 2020-11-29 ENCOUNTER — Other Ambulatory Visit: Payer: Self-pay

## 2020-11-29 VITALS — BP 122/74 | HR 85 | Temp 98.6°F | Resp 16 | Ht 62.0 in | Wt 127.0 lb

## 2020-11-29 DIAGNOSIS — K219 Gastro-esophageal reflux disease without esophagitis: Secondary | ICD-10-CM

## 2020-11-29 DIAGNOSIS — F419 Anxiety disorder, unspecified: Secondary | ICD-10-CM | POA: Diagnosis not present

## 2020-11-29 DIAGNOSIS — Z23 Encounter for immunization: Secondary | ICD-10-CM

## 2020-11-29 DIAGNOSIS — G479 Sleep disorder, unspecified: Secondary | ICD-10-CM

## 2020-11-29 DIAGNOSIS — E0590 Thyrotoxicosis, unspecified without thyrotoxic crisis or storm: Secondary | ICD-10-CM | POA: Diagnosis not present

## 2020-11-29 DIAGNOSIS — Z13220 Encounter for screening for lipoid disorders: Secondary | ICD-10-CM

## 2020-11-29 DIAGNOSIS — E559 Vitamin D deficiency, unspecified: Secondary | ICD-10-CM

## 2020-11-29 NOTE — Progress Notes (Signed)
Patient ID: Shannon [name], female   DOB: [birth year], 55 y.o.   MRN: [id number]   Subjective:    Patient ID: Shannon [name], female    DOB: [birth year], 55 y.o.   MRN: [id number]  This visit occurred during the SARS-CoV-2 public health emergency.  Safety protocols were in place, including screening questions prior to the visit, additional usage of staff PPE, and extensive cleaning of exam room while observing appropriate contact time as indicated for disinfecting solutions.   Patient here for a scheduled follow up.   Chief Complaint  Patient presents with   Follow-up    Sleep issues improving some.   Marland Kitchen   HPI Has been having issues with sleep.  Was given rx for trazodone.  Has not taken.  Feels is doing some better.  Wants to hold.  Walking.  No chest pain or sob reported.  Recently diagnosed with shingles.  Better.  Had skin cancer removed yesterday.  Eating.  No nausea or vomiting reported.     Past Medical History:  Diagnosis Date   Anemia    Anxiety    Breast pain, right 11/07/2012   Breathing difficulty 06/20/2014   Environmental allergies    GERD (gastroesophageal reflux disease)    Hiatal hernia    small   History of migraine headaches    Hyperthyroidism    s/p ablation   Vaginitis 01/16/2013   Past Surgical History:  Procedure Laterality Date   BREAST BIOPSY Right 12/05/2015   neg   CESAREAN SECTION  11/98   Dr Roena Malady   CHOLECYSTECTOMY  11/05   Dr Pat Patrick   JOINT REPLACEMENT Left 2017   hip   NASAL SINUS SURGERY  11/93   SEPTOPLASTY  5/90   Dr Rossie Muskrat   TUBAL LIGATION  08/1446   UMBILICAL HERNIA REPAIR  02/1998   Family History  Problem Relation Age of Onset   Heart disease Father    Hypertension Father    Thyroid disease Father    Hypercholesterolemia Father    Kidney cancer Father    Leukemia Father        hairy cell   Hypercholesterolemia Mother    Thyroid disease Mother    Breast cancer Mother 29   Breast cancer Other        maternal great  grandmother   Heart disease Maternal Grandfather        MI - age 56   Prostate cancer Paternal Grandfather    Pancreatic cancer Paternal Grandfather    Lung cancer Paternal Grandfather    Bladder Cancer Neg Hx    Social History   Socioeconomic History   Marital status: Married    Spouse name: Not on file   Number of children: 1   Years of education: Not on file   Highest education level: Not on file  Occupational History   Not on file  Tobacco Use   Smoking status: Former    Types: Cigarettes    Quit date: 01/28/2000    Years since quitting: 20.8   Smokeless tobacco: Never  Substance and Sexual Activity   Alcohol use: No    Alcohol/week: 0.0 standard drinks   Drug use: No   Sexual activity: Not on file  Other Topics Concern   Not on file  Social History Narrative   Not on file   Social Determinants of Health   Financial Resource Strain: Not on file  Food Insecurity: Not on file  Transportation Needs: Not on file  Physical Activity: Not on file  Stress: Not on file  Social Connections: Not on file     Review of Systems  Constitutional:  Negative for appetite change and unexpected weight change.  HENT:  Negative for congestion and sinus pressure.   Respiratory:  Negative for cough, chest tightness and shortness of breath.   Cardiovascular:  Negative for chest pain, palpitations and leg swelling.  Gastrointestinal:  Negative for abdominal pain, diarrhea, nausea and vomiting.  Genitourinary:  Negative for difficulty urinating and dysuria.  Musculoskeletal:  Negative for joint swelling and myalgias.  Skin:  Negative for color change and rash.  Neurological:  Negative for dizziness, light-headedness and headaches.  Psychiatric/Behavioral:  Negative for agitation and dysphoric mood.       Objective:     BP 122/74   Pulse 85   Temp 98.6 F (37 C) (Oral)   Resp 16   Ht 5\' 2"  (1.575 m)   Wt 127 lb (57.6 kg)   LMP 02/12/2013   SpO2 99%   BMI 23.23 kg/m  Wt  Readings from Last 3 Encounters:  11/29/20 127 lb (57.6 kg)  11/06/20 126 lb 9.6 oz (57.4 kg)  10/11/20 126 lb 9.6 oz (57.4 kg)    Physical Exam Vitals reviewed.  Constitutional:      General: She is not in acute distress.    Appearance: Normal appearance.  HENT:     Head: Normocephalic and atraumatic.     Right Ear: External ear normal.     Left Ear: External ear normal.  Eyes:     General: No scleral icterus.       Right eye: No discharge.        Left eye: No discharge.     Conjunctiva/sclera: Conjunctivae normal.  Neck:     Thyroid: No thyromegaly.  Cardiovascular:     Rate and Rhythm: Normal rate and regular rhythm.  Pulmonary:     Effort: No respiratory distress.     Breath sounds: Normal breath sounds. No wheezing.  Abdominal:     General: Bowel sounds are normal.     Palpations: Abdomen is soft.     Tenderness: There is no abdominal tenderness.  Musculoskeletal:        General: No swelling or tenderness.     Cervical back: Neck supple. No tenderness.  Lymphadenopathy:     Cervical: No cervical adenopathy.  Skin:    Findings: No erythema or rash.  Neurological:     Mental Status: She is alert.  Psychiatric:        Mood and Affect: Mood normal.        Behavior: Behavior normal.     Outpatient Encounter Medications as of 11/29/2020  Medication Sig   acetaminophen (TYLENOL) 500 MG tablet Take 1,000 mg by mouth every 6 (six) hours as needed for moderate pain.   ALPRAZolam (XANAX) 0.25 MG tablet TAKE ONE TABLET DAILY IF NEEDED FOR ANXIETY   cholecalciferol (VITAMIN D3) 25 MCG (1000 UNIT) tablet Take 1,000 Units by mouth daily.   Cranberry 125 MG TABS Take by mouth.   Cyanocobalamin (VITAMIN B-12) 1000 MCG/15ML LIQD Take 1,000 mcg by mouth every 30 (thirty) days.   EPINEPHrine 0.3 mg/0.3 mL IJ SOAJ injection    Estriol 10 % CREA by Does not apply route.   famotidine (PEPCID) 40 MG tablet Take by mouth.   omeprazole (PRILOSEC) 40 MG capsule Take 40 mg by mouth  daily.   ondansetron (ZOFRAN ODT) 4 MG disintegrating tablet  Take 1 tablet (4 mg total) by mouth 2 (two) times daily as needed for nausea or vomiting.   SUMAtriptan (IMITREX) 100 MG tablet    traZODone (DESYREL) 50 MG tablet Take 0.5-1 tablets (25-50 mg total) by mouth at bedtime as needed for sleep.   [DISCONTINUED] gabapentin (NEURONTIN) 300 MG capsule Take 1 capsule (300 mg total) by mouth 2 (two) times daily as needed. (Patient not taking: Reported on 11/29/2020)   No facility-administered encounter medications on file as of 11/29/2020.     Lab Results  Component Value Date   WBC 4.8 03/26/2020   HGB 13.3 03/26/2020   HCT 39.1 03/26/2020   PLT 242.0 03/26/2020   GLUCOSE 78 03/26/2020   CHOL 200 03/26/2020   TRIG 108.0 03/26/2020   HDL 74.90 03/26/2020   LDLCALC 104 (H) 03/26/2020   ALT 15 03/26/2020   AST 17 03/26/2020   NA 141 03/26/2020   K 4.0 03/26/2020   CL 105 03/26/2020   CREATININE 0.62 03/26/2020   BUN 10 03/26/2020   CO2 28 03/26/2020   TSH 1.36 03/26/2020    US BREAST LTD UNI LEFT INC AXILLA  Result Date: 10/17/2020 CLINICAL DATA:  Bilateral inferior breast pain. Burning sensation in the RIGHT nipple. EXAM: DIGITAL DIAGNOSTIC BILATERAL MAMMOGRAM WITH TOMOSYNTHESIS AND CAD; ULTRASOUND RIGHT BREAST LIMITED; ULTRASOUND LEFT BREAST LIMITED TECHNIQUE: Bilateral digital diagnostic mammography and breast tomosynthesis was performed. The images were evaluated with computer-aided detection.; Targeted ultrasound examination of the right breast was performed; Targeted ultrasound examination of the left breast was performed. COMPARISON:  Previous exam(s). ACR Breast Density Category c: The breast tissue is heterogeneously dense, which may obscure small masses. FINDINGS: Spot compression tomosynthesis views were obtained over the area of focal pain in the RIGHT retroareolar breast. No suspicious mammographic finding is identified in this area. No suspicious mass, microcalcification,  or other finding is identified in the RIGHT breast. Diagnostic mammographic images were obtained over the area of pain in the LEFT breast. No suspicious mammographic finding is identified in this area. A questioned asymmetry in the outer breast resolves with additional views, consistent with overlapping tissue. No suspicious mass, microcalcification, or other finding is identified in the LEFT breast. On physical exam, no suspicious mass is appreciated. Targeted RIGHT breast ultrasound was performed in the area of pain at the retroareolar and lower breast. No suspicious solid or cystic mass is identified. No findings to explain the patient's symptoms. Targeted LEFT breast ultrasound was performed in the area of pain at the lower breast. No suspicious solid or cystic mass is identified. No findings to explain the patient's symptoms. IMPRESSION: 1. No mammographic or sonographic evidence of malignancy at the sites of painful concern bilaterally. Any further workup of the patient's symptoms should be based on the clinical assessment. Recommend routine annual screening mammogram in 1 year. 2. No mammographic evidence of malignancy bilaterally RECOMMENDATION: Screening mammogram in one year.(Code:SM-B-01Y) I have discussed the findings and recommendations with the patient. If applicable, a reminder letter will be sent to the patient regarding the next appointment. BI-RADS CATEGORY  1: Negative. Electronically Signed   By: Valentino Saxon M.D.   On: 10/17/2020 14:20  US BREAST LTD UNI RIGHT INC AXILLA  Result Date: 10/17/2020 CLINICAL DATA:  Bilateral inferior breast pain. Burning sensation in the RIGHT nipple. EXAM: DIGITAL DIAGNOSTIC BILATERAL MAMMOGRAM WITH TOMOSYNTHESIS AND CAD; ULTRASOUND RIGHT BREAST LIMITED; ULTRASOUND LEFT BREAST LIMITED TECHNIQUE: Bilateral digital diagnostic mammography and breast tomosynthesis was performed. The images were evaluated with  computer-aided detection.; Targeted ultrasound  examination of the right breast was performed; Targeted ultrasound examination of the left breast was performed. COMPARISON:  Previous exam(s). ACR Breast Density Category c: The breast tissue is heterogeneously dense, which may obscure small masses. FINDINGS: Spot compression tomosynthesis views were obtained over the area of focal pain in the RIGHT retroareolar breast. No suspicious mammographic finding is identified in this area. No suspicious mass, microcalcification, or other finding is identified in the RIGHT breast. Diagnostic mammographic images were obtained over the area of pain in the LEFT breast. No suspicious mammographic finding is identified in this area. A questioned asymmetry in the outer breast resolves with additional views, consistent with overlapping tissue. No suspicious mass, microcalcification, or other finding is identified in the LEFT breast. On physical exam, no suspicious mass is appreciated. Targeted RIGHT breast ultrasound was performed in the area of pain at the retroareolar and lower breast. No suspicious solid or cystic mass is identified. No findings to explain the patient's symptoms. Targeted LEFT breast ultrasound was performed in the area of pain at the lower breast. No suspicious solid or cystic mass is identified. No findings to explain the patient's symptoms. IMPRESSION: 1. No mammographic or sonographic evidence of malignancy at the sites of painful concern bilaterally. Any further workup of the patient's symptoms should be based on the clinical assessment. Recommend routine annual screening mammogram in 1 year. 2. No mammographic evidence of malignancy bilaterally RECOMMENDATION: Screening mammogram in one year.(Code:SM-B-01Y) I have discussed the findings and recommendations with the patient. If applicable, a reminder letter will be sent to the patient regarding the next appointment. BI-RADS CATEGORY  1: Negative. Electronically Signed   By: Valentino Saxon M.D.   On:  10/17/2020 14:20  MM DIAG BREAST TOMO BILATERAL  Result Date: 10/17/2020 CLINICAL DATA:  Bilateral inferior breast pain. Burning sensation in the RIGHT nipple. EXAM: DIGITAL DIAGNOSTIC BILATERAL MAMMOGRAM WITH TOMOSYNTHESIS AND CAD; ULTRASOUND RIGHT BREAST LIMITED; ULTRASOUND LEFT BREAST LIMITED TECHNIQUE: Bilateral digital diagnostic mammography and breast tomosynthesis was performed. The images were evaluated with computer-aided detection.; Targeted ultrasound examination of the right breast was performed; Targeted ultrasound examination of the left breast was performed. COMPARISON:  Previous exam(s). ACR Breast Density Category c: The breast tissue is heterogeneously dense, which may obscure small masses. FINDINGS: Spot compression tomosynthesis views were obtained over the area of focal pain in the RIGHT retroareolar breast. No suspicious mammographic finding is identified in this area. No suspicious mass, microcalcification, or other finding is identified in the RIGHT breast. Diagnostic mammographic images were obtained over the area of pain in the LEFT breast. No suspicious mammographic finding is identified in this area. A questioned asymmetry in the outer breast resolves with additional views, consistent with overlapping tissue. No suspicious mass, microcalcification, or other finding is identified in the LEFT breast. On physical exam, no suspicious mass is appreciated. Targeted RIGHT breast ultrasound was performed in the area of pain at the retroareolar and lower breast. No suspicious solid or cystic mass is identified. No findings to explain the patient's symptoms. Targeted LEFT breast ultrasound was performed in the area of pain at the lower breast. No suspicious solid or cystic mass is identified. No findings to explain the patient's symptoms. IMPRESSION: 1. No mammographic or sonographic evidence of malignancy at the sites of painful concern bilaterally. Any further workup of the patient's symptoms  should be based on the clinical assessment. Recommend routine annual screening mammogram in 1 year. 2. No mammographic evidence of malignancy bilaterally  RECOMMENDATION: Screening mammogram in one year.(Code:SM-B-01Y) I have discussed the findings and recommendations with the patient. If applicable, a reminder letter will be sent to the patient regarding the next appointment. BI-RADS CATEGORY  1: Negative. Electronically Signed   By: Valentino Saxon M.D.   On: 10/17/2020 14:20      Assessment & Plan:   Problem List Items Addressed This Visit     Anxiety    Discussed.  Overall appears to be doing better.  Follow.  Notify me if any change or problems.        GERD (gastroesophageal reflux disease)    Stable.  On prilosec.       Hyperthyroidism    S/p ablation.  Follow tsh.       Relevant Orders   CBC with Differential/Platelet   Comprehensive metabolic panel   TSH   Sleep difficulties    Have discussed sleep issues.  Appears to be doing some better.  Exercising.  Has rx for trazodone.  Follow.        Vitamin D insufficiency    Check vitamin D level with next labs.       Relevant Orders   VITAMIN D 25 Hydroxy (Vit-D Deficiency, Fractures)   Other Visit Diagnoses     Need for immunization against influenza    -  Primary   Relevant Orders   Flu Vaccine QUAD 17mo+IM (Fluarix, Fluzone & Alfiuria Quad PF) (Completed)   Screening cholesterol level       Relevant Orders   Lipid panel        [name], MD

## 2020-12-04 ENCOUNTER — Other Ambulatory Visit: Payer: Self-pay

## 2020-12-04 ENCOUNTER — Ambulatory Visit: Payer: BC Managed Care – PPO | Admitting: Podiatry

## 2020-12-04 DIAGNOSIS — J301 Allergic rhinitis due to pollen: Secondary | ICD-10-CM | POA: Diagnosis not present

## 2020-12-04 DIAGNOSIS — M7751 Other enthesopathy of right foot: Secondary | ICD-10-CM

## 2020-12-04 DIAGNOSIS — M2021 Hallux rigidus, right foot: Secondary | ICD-10-CM

## 2020-12-04 DIAGNOSIS — J3089 Other allergic rhinitis: Secondary | ICD-10-CM | POA: Diagnosis not present

## 2020-12-04 NOTE — Progress Notes (Signed)
Patient is orthotics were picked up.  They are functioning well.

## 2020-12-08 ENCOUNTER — Encounter: Payer: Self-pay | Admitting: Internal Medicine

## 2020-12-08 NOTE — Assessment & Plan Note (Signed)
Check vitamin D level with next labs.  ?

## 2020-12-08 NOTE — Assessment & Plan Note (Signed)
Discussed.  Overall appears to be doing better.  Follow.  Notify me if any change or problems.

## 2020-12-08 NOTE — Assessment & Plan Note (Signed)
Stable.  On prilosec.  

## 2020-12-08 NOTE — Assessment & Plan Note (Signed)
Have discussed sleep issues.  Appears to be doing some better.  Exercising.  Has rx for trazodone.  Follow.

## 2020-12-08 NOTE — Assessment & Plan Note (Signed)
S/p ablation.  Follow tsh.

## 2020-12-11 DIAGNOSIS — J3089 Other allergic rhinitis: Secondary | ICD-10-CM | POA: Diagnosis not present

## 2020-12-11 DIAGNOSIS — J301 Allergic rhinitis due to pollen: Secondary | ICD-10-CM | POA: Diagnosis not present

## 2020-12-13 DIAGNOSIS — R21 Rash and other nonspecific skin eruption: Secondary | ICD-10-CM | POA: Diagnosis not present

## 2020-12-13 DIAGNOSIS — J3089 Other allergic rhinitis: Secondary | ICD-10-CM | POA: Diagnosis not present

## 2020-12-13 DIAGNOSIS — H1045 Other chronic allergic conjunctivitis: Secondary | ICD-10-CM | POA: Diagnosis not present

## 2020-12-13 DIAGNOSIS — J301 Allergic rhinitis due to pollen: Secondary | ICD-10-CM | POA: Diagnosis not present

## 2020-12-18 DIAGNOSIS — J301 Allergic rhinitis due to pollen: Secondary | ICD-10-CM | POA: Diagnosis not present

## 2020-12-18 DIAGNOSIS — J3089 Other allergic rhinitis: Secondary | ICD-10-CM | POA: Diagnosis not present

## 2020-12-25 DIAGNOSIS — J301 Allergic rhinitis due to pollen: Secondary | ICD-10-CM | POA: Diagnosis not present

## 2020-12-25 DIAGNOSIS — J3089 Other allergic rhinitis: Secondary | ICD-10-CM | POA: Diagnosis not present

## 2020-12-27 ENCOUNTER — Ambulatory Visit (INDEPENDENT_AMBULATORY_CARE_PROVIDER_SITE_OTHER): Payer: BC Managed Care – PPO

## 2020-12-27 ENCOUNTER — Other Ambulatory Visit: Payer: Self-pay

## 2020-12-27 DIAGNOSIS — E538 Deficiency of other specified B group vitamins: Secondary | ICD-10-CM | POA: Diagnosis not present

## 2020-12-27 MED ORDER — CYANOCOBALAMIN 1000 MCG/ML IJ SOLN
1000.0000 ug | Freq: Once | INTRAMUSCULAR | Status: AC
  Administered 2020-12-27: 1000 ug via INTRAMUSCULAR

## 2020-12-27 NOTE — Progress Notes (Signed)
Patient presented for B 12 injection to right deltoid, patient voiced no concerns nor showed any signs of distress during injection. 

## 2021-01-03 DIAGNOSIS — J3089 Other allergic rhinitis: Secondary | ICD-10-CM | POA: Diagnosis not present

## 2021-01-03 DIAGNOSIS — J301 Allergic rhinitis due to pollen: Secondary | ICD-10-CM | POA: Diagnosis not present

## 2021-01-10 DIAGNOSIS — J301 Allergic rhinitis due to pollen: Secondary | ICD-10-CM | POA: Diagnosis not present

## 2021-01-10 DIAGNOSIS — J3089 Other allergic rhinitis: Secondary | ICD-10-CM | POA: Diagnosis not present

## 2021-01-14 DIAGNOSIS — J301 Allergic rhinitis due to pollen: Secondary | ICD-10-CM | POA: Diagnosis not present

## 2021-01-14 DIAGNOSIS — J0181 Other acute recurrent sinusitis: Secondary | ICD-10-CM | POA: Diagnosis not present

## 2021-01-15 DIAGNOSIS — J3089 Other allergic rhinitis: Secondary | ICD-10-CM | POA: Diagnosis not present

## 2021-01-15 DIAGNOSIS — J301 Allergic rhinitis due to pollen: Secondary | ICD-10-CM | POA: Diagnosis not present

## 2021-01-22 DIAGNOSIS — J3081 Allergic rhinitis due to animal (cat) (dog) hair and dander: Secondary | ICD-10-CM | POA: Diagnosis not present

## 2021-01-22 DIAGNOSIS — J301 Allergic rhinitis due to pollen: Secondary | ICD-10-CM | POA: Diagnosis not present

## 2021-01-29 ENCOUNTER — Other Ambulatory Visit: Payer: Self-pay

## 2021-01-29 ENCOUNTER — Ambulatory Visit (INDEPENDENT_AMBULATORY_CARE_PROVIDER_SITE_OTHER): Payer: BC Managed Care – PPO

## 2021-01-29 DIAGNOSIS — E538 Deficiency of other specified B group vitamins: Secondary | ICD-10-CM | POA: Diagnosis not present

## 2021-01-29 MED ORDER — CYANOCOBALAMIN 1000 MCG/ML IJ SOLN
1000.0000 ug | Freq: Once | INTRAMUSCULAR | Status: AC
  Administered 2021-01-29: 1000 ug via INTRAMUSCULAR

## 2021-01-29 NOTE — Progress Notes (Signed)
Patient here for b12 injection. Given in L deltoid. Tolerated well, no concerns.

## 2021-02-05 DIAGNOSIS — J301 Allergic rhinitis due to pollen: Secondary | ICD-10-CM | POA: Diagnosis not present

## 2021-02-05 DIAGNOSIS — G43839 Menstrual migraine, intractable, without status migrainosus: Secondary | ICD-10-CM | POA: Diagnosis not present

## 2021-02-05 DIAGNOSIS — G43019 Migraine without aura, intractable, without status migrainosus: Secondary | ICD-10-CM | POA: Diagnosis not present

## 2021-02-05 DIAGNOSIS — J3089 Other allergic rhinitis: Secondary | ICD-10-CM | POA: Diagnosis not present

## 2021-02-07 DIAGNOSIS — J301 Allergic rhinitis due to pollen: Secondary | ICD-10-CM | POA: Diagnosis not present

## 2021-02-07 DIAGNOSIS — J3089 Other allergic rhinitis: Secondary | ICD-10-CM | POA: Diagnosis not present

## 2021-02-07 DIAGNOSIS — R059 Cough, unspecified: Secondary | ICD-10-CM | POA: Diagnosis not present

## 2021-02-07 DIAGNOSIS — H1045 Other chronic allergic conjunctivitis: Secondary | ICD-10-CM | POA: Diagnosis not present

## 2021-02-07 DIAGNOSIS — J328 Other chronic sinusitis: Secondary | ICD-10-CM | POA: Diagnosis not present

## 2021-02-07 DIAGNOSIS — R21 Rash and other nonspecific skin eruption: Secondary | ICD-10-CM | POA: Diagnosis not present

## 2021-02-11 DIAGNOSIS — H16223 Keratoconjunctivitis sicca, not specified as Sjogren's, bilateral: Secondary | ICD-10-CM | POA: Diagnosis not present

## 2021-02-14 DIAGNOSIS — J301 Allergic rhinitis due to pollen: Secondary | ICD-10-CM | POA: Diagnosis not present

## 2021-02-14 DIAGNOSIS — J3089 Other allergic rhinitis: Secondary | ICD-10-CM | POA: Diagnosis not present

## 2021-02-19 DIAGNOSIS — J301 Allergic rhinitis due to pollen: Secondary | ICD-10-CM | POA: Diagnosis not present

## 2021-02-19 DIAGNOSIS — J3089 Other allergic rhinitis: Secondary | ICD-10-CM | POA: Diagnosis not present

## 2021-02-26 DIAGNOSIS — J3089 Other allergic rhinitis: Secondary | ICD-10-CM | POA: Diagnosis not present

## 2021-02-26 DIAGNOSIS — J301 Allergic rhinitis due to pollen: Secondary | ICD-10-CM | POA: Diagnosis not present

## 2021-03-01 ENCOUNTER — Other Ambulatory Visit: Payer: Self-pay

## 2021-03-01 ENCOUNTER — Ambulatory Visit (INDEPENDENT_AMBULATORY_CARE_PROVIDER_SITE_OTHER): Payer: BC Managed Care – PPO | Admitting: *Deleted

## 2021-03-01 DIAGNOSIS — E538 Deficiency of other specified B group vitamins: Secondary | ICD-10-CM | POA: Diagnosis not present

## 2021-03-05 DIAGNOSIS — J301 Allergic rhinitis due to pollen: Secondary | ICD-10-CM | POA: Diagnosis not present

## 2021-03-05 DIAGNOSIS — J3089 Other allergic rhinitis: Secondary | ICD-10-CM | POA: Diagnosis not present

## 2021-03-11 MED ORDER — CYANOCOBALAMIN 1000 MCG/ML IJ SOLN
1000.0000 ug | Freq: Once | INTRAMUSCULAR | Status: AC
  Administered 2021-03-01: 1000 ug via INTRAMUSCULAR

## 2021-03-11 NOTE — Progress Notes (Signed)
Patient presented for B 12 injection to left deltoid, patient voiced no concerns nor showed any signs of distress during injection. 

## 2021-03-12 DIAGNOSIS — J301 Allergic rhinitis due to pollen: Secondary | ICD-10-CM | POA: Diagnosis not present

## 2021-03-12 DIAGNOSIS — J3089 Other allergic rhinitis: Secondary | ICD-10-CM | POA: Diagnosis not present

## 2021-03-13 DIAGNOSIS — J3089 Other allergic rhinitis: Secondary | ICD-10-CM | POA: Diagnosis not present

## 2021-03-21 DIAGNOSIS — J301 Allergic rhinitis due to pollen: Secondary | ICD-10-CM | POA: Diagnosis not present

## 2021-03-21 DIAGNOSIS — J3089 Other allergic rhinitis: Secondary | ICD-10-CM | POA: Diagnosis not present

## 2021-03-26 DIAGNOSIS — J301 Allergic rhinitis due to pollen: Secondary | ICD-10-CM | POA: Diagnosis not present

## 2021-03-26 DIAGNOSIS — J3089 Other allergic rhinitis: Secondary | ICD-10-CM | POA: Diagnosis not present

## 2021-04-02 ENCOUNTER — Other Ambulatory Visit: Payer: Self-pay

## 2021-04-02 ENCOUNTER — Other Ambulatory Visit (INDEPENDENT_AMBULATORY_CARE_PROVIDER_SITE_OTHER): Payer: BC Managed Care – PPO

## 2021-04-02 DIAGNOSIS — J301 Allergic rhinitis due to pollen: Secondary | ICD-10-CM | POA: Diagnosis not present

## 2021-04-02 DIAGNOSIS — E559 Vitamin D deficiency, unspecified: Secondary | ICD-10-CM | POA: Diagnosis not present

## 2021-04-02 DIAGNOSIS — E0590 Thyrotoxicosis, unspecified without thyrotoxic crisis or storm: Secondary | ICD-10-CM

## 2021-04-02 DIAGNOSIS — J3089 Other allergic rhinitis: Secondary | ICD-10-CM | POA: Diagnosis not present

## 2021-04-02 DIAGNOSIS — Z13220 Encounter for screening for lipoid disorders: Secondary | ICD-10-CM | POA: Diagnosis not present

## 2021-04-02 LAB — CBC WITH DIFFERENTIAL/PLATELET
Basophils Absolute: 0 10*3/uL (ref 0.0–0.1)
Basophils Relative: 0.8 % (ref 0.0–3.0)
Eosinophils Absolute: 0.1 10*3/uL (ref 0.0–0.7)
Eosinophils Relative: 1.6 % (ref 0.0–5.0)
HCT: 38.3 % (ref 36.0–46.0)
Hemoglobin: 12.9 g/dL (ref 12.0–15.0)
Lymphocytes Relative: 26.2 % (ref 12.0–46.0)
Lymphs Abs: 1.5 10*3/uL (ref 0.7–4.0)
MCHC: 33.7 g/dL (ref 30.0–36.0)
MCV: 91.9 fl (ref 78.0–100.0)
Monocytes Absolute: 0.4 10*3/uL (ref 0.1–1.0)
Monocytes Relative: 6.9 % (ref 3.0–12.0)
Neutro Abs: 3.6 10*3/uL (ref 1.4–7.7)
Neutrophils Relative %: 64.5 % (ref 43.0–77.0)
Platelets: 234 10*3/uL (ref 150.0–400.0)
RBC: 4.16 Mil/uL (ref 3.87–5.11)
RDW: 13.4 % (ref 11.5–15.5)
WBC: 5.6 10*3/uL (ref 4.0–10.5)

## 2021-04-02 LAB — COMPREHENSIVE METABOLIC PANEL
ALT: 15 U/L (ref 0–35)
AST: 18 U/L (ref 0–37)
Albumin: 4.3 g/dL (ref 3.5–5.2)
Alkaline Phosphatase: 66 U/L (ref 39–117)
BUN: 12 mg/dL (ref 6–23)
CO2: 30 mEq/L (ref 19–32)
Calcium: 9.6 mg/dL (ref 8.4–10.5)
Chloride: 105 mEq/L (ref 96–112)
Creatinine, Ser: 0.59 mg/dL (ref 0.40–1.20)
GFR: 99.95 mL/min (ref >60.00)
Glucose, Bld: 77 mg/dL (ref 70–99)
Potassium: 3.9 mEq/L (ref 3.5–5.1)
Sodium: 141 mEq/L (ref 135–145)
Total Bilirubin: 0.4 mg/dL (ref 0.2–1.2)
Total Protein: 6.4 g/dL (ref 6.0–8.3)

## 2021-04-02 LAB — TSH: TSH: 1.65 u[IU]/mL (ref 0.35–5.50)

## 2021-04-02 LAB — LIPID PANEL
Cholesterol: 195 mg/dL (ref 0–200)
HDL: 77.1 mg/dL (ref >39.00)
LDL Cholesterol: 96 mg/dL (ref 0–99)
NonHDL: 117.93
Total CHOL/HDL Ratio: 3
Triglycerides: 110 mg/dL (ref 0.0–149.0)
VLDL: 22 mg/dL (ref 0.0–40.0)

## 2021-04-02 LAB — VITAMIN D 25 HYDROXY (VIT D DEFICIENCY, FRACTURES): VITD: 21.22 ng/mL — ABNORMAL LOW (ref 30.00–100.00)

## 2021-04-02 NOTE — Progress Notes (Signed)
PROVIDER NOTE: Information contained herein reflects review and annotations entered in association with encounter. Interpretation of such information and data should be left to medically-trained personnel. Information provided to patient can be located elsewhere in the medical record under "Patient Instructions". Document created using STT-dictation technology, any transcriptional errors that may result from process are unintentional.    Patient: Shannon [name]  Service Category: E/M  Provider: Gaspar Cola, MD  DOB: 09/16/1965  DOS: 04/03/2021  Specialty: Interventional Pain Management  MRN: [id number]  Setting: Ambulatory outpatient  PCP: [name], MD  Type: Established Patient    Referring Provider: [name], MD  Location: Office  Delivery: Face-to-face     HPI  Shannon [name], a 56 y.o. year old female, is here today because of her Chronic hip pain, right [M25.551, G89.29]. Shannon [name] primary complain today is Back Pain (Right, lower ) Last encounter: My last encounter with her was on Visit date not found. Pertinent problems: Shannon [name] has Migraine; Chronic low back pain (1ry area of Pain) (Bilateral) (R>L); Lumbar lateral recess stenosis (L4-5) (Left); Lumbar facet hypertrophy; Chronic lumbar radicular pain (intermittent) (L4 Dermatome) (Left); Chronic hip pain (resolved after hip replacement) (Left); Lumbar spondylosis; Osteoarthritis of sacroiliac joint (Bilateral) (R>L); History of hip replacement (Left); Chronic sacroiliac joint pain (Right); Chronic myofascial pain syndrome (Right Gluteous Muscle); Chronic pain syndrome; Other specified dorsopathies, sacral and sacrococcygeal region; Lumbar Facet syndrome (Bilateral) (R>L); Spondylosis without myelopathy or radiculopathy, lumbosacral region; Chronic toe pain (Right); Great toe pain (Right); Osteoarthritis of great toe joint (Right); DDD (degenerative disc disease), lumbosacral; Right calf pain; Cervicalgia; Cervical  myofascial pain syndrome (Left); Occipital neuralgia (Left); DDD (degenerative disc disease), cervical (C5-6, C6-7); Grade 1 Anterolisthesis of C5/C6; Cervical facet arthropathy (Bilateral) (L>R); Cervical facet syndrome (Bilateral); TMJ arthralgia; Neck pain; Breast pain; Chronic hip pain (Right); Somatic dysfunction of sacroiliac joint (Right); and Sacroiliac joint dysfunction (Right) on their pertinent problem list. Pain Assessment: Severity of   is reported as a 3 /10. Location: Back Right, Lower/right buttock, back of right upper leg. Onset: More than a month ago. Quality: Aching. Timing: Intermittent. Modifying factor(s): Tylenol, Aleve, chiropractor. Vitals:  height is '5\' 2"'  (1.575 m) and weight is 122 lb (55.3 kg). Her temporal temperature is 97.2 F (36.2 C) (abnormal). Her blood pressure is 123/82 and her pulse is 85. Her respiration is 16 and oxygen saturation is 99%.   Reason for encounter: evaluation of worsening, or previously known (established) problem.  Today she indicates having pain on her right buttocks and lateral aspect of her upper leg.  Physical exam: Today the patient had a negative bilateral straight leg raise where she was able to bring the leg up past 90 degrees.  This was completely asymptomatic.  Provocative Patrick maneuver was positive on the right side for pain in the right hip area and buttocks area.  No major discomfort in the lower back.  The test proved to be positive for right hip arthralgia and negative for right sacroiliac joint symptoms.  Review of the medical records show no recent x-rays of the right hip within the [facility] system since 2006.  Review of "Care Everywhere" also shows no available x-rays of the right hip.  There are several x-rays of the left hip including a CT scan, but she had this left hip replaced and refers doing great with no pain or one any other problems from the left hip.  Today we will be ordering x-rays of the right hip  and I will have schedule  her for a right intra-articular hip joint injection  Pharmacotherapy Assessment  Analgesic: None from our practice.   Monitoring: [place] PMP: PDMP reviewed during this encounter.       Pharmacotherapy: No side-effects or adverse reactions reported. Compliance: No problems identified. Effectiveness: Clinically acceptable.  No notes on file  UDS:  No results found for: SUMMARY   ROS  Constitutional: Denies any fever or chills Gastrointestinal: No reported hemesis, hematochezia, vomiting, or acute GI distress Musculoskeletal: Denies any acute onset joint swelling, redness, loss of ROM, or weakness Neurological: No reported episodes of acute onset apraxia, aphasia, dysarthria, agnosia, amnesia, paralysis, loss of coordination, or loss of consciousness  Medication Review  ALPRAZolam, EPINEPHrine, Estriol, SUMAtriptan, Vitamin B-12, acetaminophen, cholecalciferol, famotidine, omeprazole, and ondansetron  History Review  Allergy: Shannon [name] is allergic to codeine, decongest-aid [pseudoephedrine], flagyl [metronidazole], and sulfa antibiotics. Drug: Shannon [name]  reports no history of drug use. Alcohol:  reports no history of alcohol use. Tobacco:  reports that she quit smoking about 21 years ago. Her smoking use included cigarettes. She has never used smokeless tobacco. Social: Shannon [name]  reports that she quit smoking about 21 years ago. Her smoking use included cigarettes. She has never used smokeless tobacco. She reports that she does not drink alcohol and does not use drugs. Medical:  has a past medical history of Anemia, Anxiety, Breast pain, right (11/07/2012), Breathing difficulty (06/20/2014), Environmental allergies, GERD (gastroesophageal reflux disease), Hiatal hernia, History of migraine headaches, Hyperthyroidism, and Vaginitis (01/16/2013). Surgical: Shannon [name]  has a past surgical history that includes Septoplasty (5/90); Nasal sinus surgery (11/93); Cesarean section (11/98); Tubal  ligation (02/1998); Umbilical hernia repair (02/1998); Cholecystectomy (11/05); Joint replacement (Left, 2017); and Breast biopsy (Right, 12/05/2015). Family: family history includes Breast cancer in an other family member; Breast cancer (age of onset: 35) in her mother; Heart disease in her father and maternal grandfather; Hypercholesterolemia in her father and mother; Hypertension in her father; Kidney cancer in her father; Leukemia in her father; Lung cancer in her paternal grandfather; Pancreatic cancer in her paternal grandfather; Prostate cancer in her paternal grandfather; Thyroid disease in her father and mother.  Laboratory Chemistry Profile   Renal Lab Results  Component Value Date   BUN 12 04/02/2021   CREATININE 0.59 04/02/2021   GFR 99.95 04/02/2021   GFRAA >60 05/02/2014   GFRNONAA >60 05/02/2014    Hepatic Lab Results  Component Value Date   AST 18 04/02/2021   ALT 15 04/02/2021   ALBUMIN 4.3 04/02/2021   ALKPHOS 66 04/02/2021    Electrolytes Lab Results  Component Value Date   NA 141 04/02/2021   K 3.9 04/02/2021   CL 105 04/02/2021   CALCIUM 9.6 04/02/2021    Bone Lab Results  Component Value Date   VD25OH 21.22 (L) 04/02/2021   25OHVITD1 28 (L) 10/05/2017   25OHVITD2 <1.0 10/05/2017   25OHVITD3 28 10/05/2017    Inflammation (CRP: Acute Phase) (ESR: Chronic Phase) Lab Results  Component Value Date   CRP 2 10/05/2017   ESRSEDRATE 7 10/05/2017         Note: Above Lab results reviewed.  Recent Imaging Review  US BREAST LTD UNI LEFT INC AXILLA CLINICAL DATA:  Bilateral inferior breast pain. Burning sensation in the RIGHT nipple.  EXAM: DIGITAL DIAGNOSTIC BILATERAL MAMMOGRAM WITH TOMOSYNTHESIS AND CAD; ULTRASOUND RIGHT BREAST LIMITED; ULTRASOUND LEFT BREAST LIMITED  TECHNIQUE: Bilateral digital diagnostic mammography and breast tomosynthesis was performed. The images were  evaluated with computer-aided detection.; Targeted ultrasound examination of  the right breast was performed; Targeted ultrasound examination of the left breast was performed.  COMPARISON:  Previous exam(s).  ACR Breast Density Category c: The breast tissue is heterogeneously dense, which may obscure small masses.  FINDINGS: Spot compression tomosynthesis views were obtained over the area of focal pain in the RIGHT retroareolar breast. No suspicious mammographic finding is identified in this area. No suspicious mass, microcalcification, or other finding is identified in the RIGHT breast.  Diagnostic mammographic images were obtained over the area of pain in the LEFT breast. No suspicious mammographic finding is identified in this area. A questioned asymmetry in the outer breast resolves with additional views, consistent with overlapping tissue. No suspicious mass, microcalcification, or other finding is identified in the LEFT breast.  On physical exam, no suspicious mass is appreciated.  Targeted RIGHT breast ultrasound was performed in the area of pain at the retroareolar and lower breast. No suspicious solid or cystic mass is identified. No findings to explain the patient's symptoms.  Targeted LEFT breast ultrasound was performed in the area of pain at the lower breast. No suspicious solid or cystic mass is identified. No findings to explain the patient's symptoms.  IMPRESSION: 1. No mammographic or sonographic evidence of malignancy at the sites of painful concern bilaterally. Any further workup of the patient's symptoms should be based on the clinical assessment. Recommend routine annual screening mammogram in 1 year. 2. No mammographic evidence of malignancy bilaterally  RECOMMENDATION: Screening mammogram in one year.(Code:SM-B-01Y)  I have discussed the findings and recommendations with the patient. If applicable, a reminder letter will be sent to the patient regarding the next appointment.  BI-RADS CATEGORY  1: Negative.  Electronically  Signed   By: Valentino Saxon M.D.   On: 10/17/2020 14:20 US BREAST LTD UNI RIGHT INC AXILLA CLINICAL DATA:  Bilateral inferior breast pain. Burning sensation in the RIGHT nipple.  EXAM: DIGITAL DIAGNOSTIC BILATERAL MAMMOGRAM WITH TOMOSYNTHESIS AND CAD; ULTRASOUND RIGHT BREAST LIMITED; ULTRASOUND LEFT BREAST LIMITED  TECHNIQUE: Bilateral digital diagnostic mammography and breast tomosynthesis was performed. The images were evaluated with computer-aided detection.; Targeted ultrasound examination of the right breast was performed; Targeted ultrasound examination of the left breast was performed.  COMPARISON:  Previous exam(s).  ACR Breast Density Category c: The breast tissue is heterogeneously dense, which may obscure small masses.  FINDINGS: Spot compression tomosynthesis views were obtained over the area of focal pain in the RIGHT retroareolar breast. No suspicious mammographic finding is identified in this area. No suspicious mass, microcalcification, or other finding is identified in the RIGHT breast.  Diagnostic mammographic images were obtained over the area of pain in the LEFT breast. No suspicious mammographic finding is identified in this area. A questioned asymmetry in the outer breast resolves with additional views, consistent with overlapping tissue. No suspicious mass, microcalcification, or other finding is identified in the LEFT breast.  On physical exam, no suspicious mass is appreciated.  Targeted RIGHT breast ultrasound was performed in the area of pain at the retroareolar and lower breast. No suspicious solid or cystic mass is identified. No findings to explain the patient's symptoms.  Targeted LEFT breast ultrasound was performed in the area of pain at the lower breast. No suspicious solid or cystic mass is identified. No findings to explain the patient's symptoms.  IMPRESSION: 1. No mammographic or sonographic evidence of malignancy at the sites  of painful concern bilaterally. Any further workup of the patient's symptoms should  be based on the clinical assessment. Recommend routine annual screening mammogram in 1 year. 2. No mammographic evidence of malignancy bilaterally  RECOMMENDATION: Screening mammogram in one year.(Code:SM-B-01Y)  I have discussed the findings and recommendations with the patient. If applicable, a reminder letter will be sent to the patient regarding the next appointment.  BI-RADS CATEGORY  1: Negative.  Electronically Signed   By: Valentino Saxon M.D.   On: 10/17/2020 14:20 MM DIAG BREAST TOMO BILATERAL CLINICAL DATA:  Bilateral inferior breast pain. Burning sensation in the RIGHT nipple.  EXAM: DIGITAL DIAGNOSTIC BILATERAL MAMMOGRAM WITH TOMOSYNTHESIS AND CAD; ULTRASOUND RIGHT BREAST LIMITED; ULTRASOUND LEFT BREAST LIMITED  TECHNIQUE: Bilateral digital diagnostic mammography and breast tomosynthesis was performed. The images were evaluated with computer-aided detection.; Targeted ultrasound examination of the right breast was performed; Targeted ultrasound examination of the left breast was performed.  COMPARISON:  Previous exam(s).  ACR Breast Density Category c: The breast tissue is heterogeneously dense, which may obscure small masses.  FINDINGS: Spot compression tomosynthesis views were obtained over the area of focal pain in the RIGHT retroareolar breast. No suspicious mammographic finding is identified in this area. No suspicious mass, microcalcification, or other finding is identified in the RIGHT breast.  Diagnostic mammographic images were obtained over the area of pain in the LEFT breast. No suspicious mammographic finding is identified in this area. A questioned asymmetry in the outer breast resolves with additional views, consistent with overlapping tissue. No suspicious mass, microcalcification, or other finding is identified in the LEFT breast.  On physical exam, no  suspicious mass is appreciated.  Targeted RIGHT breast ultrasound was performed in the area of pain at the retroareolar and lower breast. No suspicious solid or cystic mass is identified. No findings to explain the patient's symptoms.  Targeted LEFT breast ultrasound was performed in the area of pain at the lower breast. No suspicious solid or cystic mass is identified. No findings to explain the patient's symptoms.  IMPRESSION: 1. No mammographic or sonographic evidence of malignancy at the sites of painful concern bilaterally. Any further workup of the patient's symptoms should be based on the clinical assessment. Recommend routine annual screening mammogram in 1 year. 2. No mammographic evidence of malignancy bilaterally  RECOMMENDATION: Screening mammogram in one year.(Code:SM-B-01Y)  I have discussed the findings and recommendations with the patient. If applicable, a reminder letter will be sent to the patient regarding the next appointment.  BI-RADS CATEGORY  1: Negative.  Electronically Signed   By: Valentino Saxon M.D.   On: 10/17/2020 14:20 Note: Reviewed        Physical Exam  General appearance: Well nourished, well developed, and well hydrated. In no apparent acute distress Mental status: Alert, oriented x 3 (person, place, & time)       Respiratory: No evidence of acute respiratory distress Eyes: PERLA Vitals: BP 123/82    Pulse 85    Temp (!) 97.2 F (36.2 C) (Temporal)    Resp 16    Ht '5\' 2"'  (1.575 m)    Wt 122 lb (55.3 kg)    LMP 02/12/2013    SpO2 99%    BMI 22.31 kg/m  BMI: Estimated body mass index is 22.31 kg/m as calculated from the following:   Height as of this encounter: '5\' 2"'  (1.575 m).   Weight as of this encounter: 122 lb (55.3 kg). Ideal: Ideal body weight: 50.1 kg (110 lb 7.2 oz) Adjusted ideal body weight: 52.2 kg (115 lb 1.1 oz)  Assessment  Status Diagnosis  Controlled Controlled Controlled 1. Chronic hip pain (Right)   2. Chronic  low back pain (1ry area of Pain) (Bilateral) (R>L)   3. Chronic sacroiliac joint pain (Right)   4. Somatic dysfunction of sacroiliac joint (Right)   5. Osteoarthritis of sacroiliac joint (Bilateral) (R>L)   6. Sacroiliac joint dysfunction (Right)   7. Other specified dorsopathies, sacral and sacrococcygeal region      Updated Problems: Problem  Chronic hip pain (Right)  Somatic dysfunction of sacroiliac joint (Right)  Sacroiliac joint dysfunction (Right)  Breast Pain  TMJ Arthralgia  Neck Pain  Chronic sacroiliac joint pain (Right)  Osteopenia  Allergic Rhinitis  Allergic Rhinitis Due to Animal (Cat) (Dog) Hair and Dander  Allergic Rhinitis Due to Pollen  Chronic Allergic Conjunctivitis  Cough  Idiopathic Urticaria  Rash and Other Nonspecific Skin Eruption  Sleep Difficulties  Vaginal Discomfort  Family History of Cancer  Osteoarthritis of hip (Left) (Resolved)    Plan of Care  Problem-specific:  No problem-specific Assessment & Plan notes found for this encounter.  Shannon [name] has a current medication list which includes the following long-term medication(s): famotidine and sumatriptan.  Pharmacotherapy (Medications Ordered): No orders of the defined types were placed in this encounter.  Orders:  Orders Placed This Encounter  Procedures   HIP INJECTION    Standing Status:   Future    Standing Expiration Date:   07/04/2021    Scheduling Instructions:     Side: Right-sided     Sedation: Patient's choice.     Timeframe: ASAA   DG HIP UNILAT W OR W/O PELVIS 2-3 VIEWS RIGHT    Please describe any evidence of DJD, such as joint narrowing, asymmetry, cysts, or any anomalies in bone density, production, or erosion.    Standing Status:   Future    Number of Occurrences:   1    Standing Expiration Date:   05/04/2021    Scheduling Instructions:     Imaging must be done as soon as possible. Inform patient that order will expire within 30 days and I will not renew it.     Order Specific Question:   Reason for Exam (SYMPTOM  OR DIAGNOSIS REQUIRED)    Answer:   Right hip pain/arthralgia    Order Specific Question:   Is the patient pregnant?    Answer:   No    Order Specific Question:   Preferred imaging location?    Answer:   [facility]    Order Specific Question:   Call Results- Best Contact Number?    Answer:   [phone] ([facility])    Order Specific Question:   Release to patient    Answer:   Immediate   Follow-up plan:   Return for [facility]) procedure: (R) IA Hip inj. #1.     Interventional Therapies  Risk   Complexity Considerations:   Estimated body mass index is 22.31 kg/m as calculated from the following:   Height as of this encounter: '5\' 2"'  (1.575 m).   Weight as of this encounter: 122 lb (55.3 kg). WNL   Planned   Pending:   Diagnostic x-rays of the right hip (04/03/2021) Therapeutic right IA hip joint injection #1    Under consideration:   Possible right lumbar facet RFA #1  Possible right SI joint RFA #1  Possible left SI joint RFA #1  Therapeutic left cervical facet RFA #1    Completed:   Palliative right gluteal MNB Palliative  right SI joint Blk x4 (09/28/2018)  Palliative left SI joint Blk x2 (09/28/2018)  Palliative right L4-5 LESI x1 (12/10/2015)  Palliative left L4-5 LESI x1 (01/11/2015)  Palliative left IA hip injection x2 (06/14/2015)  Palliative right lumbar facet MBB x3 (07/15/2018)  Diagnostic left lumbar facet MBB x1 (04/13/2018)  Diagnostic right great toe IA inj. x1  Palliative right IA small joint inj. 1st MTP (Dorsal Metatarsophalangeal ) x1 (10/15/2017)  Diagnostic left cervical facet MBB x1 (09/13/2019) (100/100/95/>75)    Therapeutic   Palliative (PRN) options:   Palliative right gluteal MNB Palliative right SI joint block #5  Palliative left SI joint block #3  Palliative right L4-5 LESI #2  Palliative left L4-5 LESI #2  Palliative right lumbar facet block #4  Diagnostic left lumbar facet  block #2  Diagnostic right great toe IA injection #2  Palliative right IA small joint injection 1st MTP (Dorsal Metatarsophalangeal ) #2  Diagnostic left cervical facet block #2     Recent Visits No visits were found meeting these conditions. Showing recent visits within past 90 days and meeting all other requirements Today's Visits Date Type Provider Dept  04/03/21 Office Visit Milinda Pointer, MD [facility]  Showing today's visits and meeting all other requirements Future Appointments Date Type Provider Dept  04/09/21 Appointment Milinda Pointer, MD [facility]  Showing future appointments within next 90 days and meeting all other requirements  I discussed the assessment and treatment plan with the patient. The patient was provided an opportunity to ask questions and all were answered. The patient agreed with the plan and demonstrated an understanding of the instructions.  Patient advised to call back or seek an in-person evaluation if the symptoms or condition worsens.  Duration of encounter: 30 minutes.  Note by: Gaspar Cola, MD Date: 04/03/2021; Time: 11:02 AM

## 2021-04-03 ENCOUNTER — Ambulatory Visit: Payer: BC Managed Care – PPO | Admitting: Pain Medicine

## 2021-04-03 ENCOUNTER — Ambulatory Visit
Admission: RE | Admit: 2021-04-03 | Discharge: 2021-04-03 | Disposition: A | Discharge status: Home / Self Care | Payer: BC Managed Care – PPO | Source: Ambulatory Visit | Attending: Pain Medicine | Admitting: Pain Medicine

## 2021-04-03 VITALS — BP 123/82 | HR 85 | Temp 97.2°F | Resp 16 | Ht 62.0 in | Wt 122.0 lb

## 2021-04-03 DIAGNOSIS — M461 Sacroiliitis, not elsewhere classified: Secondary | ICD-10-CM

## 2021-04-03 DIAGNOSIS — M9904 Segmental and somatic dysfunction of sacral region: Secondary | ICD-10-CM

## 2021-04-03 DIAGNOSIS — R21 Rash and other nonspecific skin eruption: Secondary | ICD-10-CM | POA: Insufficient documentation

## 2021-04-03 DIAGNOSIS — H1045 Other chronic allergic conjunctivitis: Secondary | ICD-10-CM | POA: Insufficient documentation

## 2021-04-03 DIAGNOSIS — M25551 Pain in right hip: Secondary | ICD-10-CM

## 2021-04-03 DIAGNOSIS — M1611 Unilateral primary osteoarthritis, right hip: Secondary | ICD-10-CM | POA: Diagnosis not present

## 2021-04-03 DIAGNOSIS — R059 Cough, unspecified: Secondary | ICD-10-CM | POA: Insufficient documentation

## 2021-04-03 DIAGNOSIS — M545 Low back pain, unspecified: Secondary | ICD-10-CM | POA: Insufficient documentation

## 2021-04-03 DIAGNOSIS — M5388 Other specified dorsopathies, sacral and sacrococcygeal region: Secondary | ICD-10-CM | POA: Insufficient documentation

## 2021-04-03 DIAGNOSIS — G8929 Other chronic pain: Secondary | ICD-10-CM

## 2021-04-03 DIAGNOSIS — J301 Allergic rhinitis due to pollen: Secondary | ICD-10-CM | POA: Insufficient documentation

## 2021-04-03 DIAGNOSIS — J3081 Allergic rhinitis due to animal (cat) (dog) hair and dander: Secondary | ICD-10-CM | POA: Insufficient documentation

## 2021-04-03 DIAGNOSIS — J309 Allergic rhinitis, unspecified: Secondary | ICD-10-CM | POA: Insufficient documentation

## 2021-04-03 DIAGNOSIS — M533 Sacrococcygeal disorders, not elsewhere classified: Secondary | ICD-10-CM

## 2021-04-03 DIAGNOSIS — L501 Idiopathic urticaria: Secondary | ICD-10-CM | POA: Insufficient documentation

## 2021-04-03 NOTE — Patient Instructions (Signed)
______________________________________________________________________ ° °Preparing for Procedure with Sedation ° °NOTICE: Due to recent regulatory changes, starting on August 27, 2020, procedures requiring intravenous (IV) sedation will no longer be performed at the [facility].  These types of procedures are required to be performed at [facility] ambulatory surgery facility.  We are very sorry for the inconvenience. ° °Procedure appointments are limited to planned procedures: °No Prescription Refills. °No disability issues will be discussed. °No medication changes will be discussed. ° °Instructions: °Oral Intake: Do not eat or drink anything for at least 8 hours prior to your procedure. (Exception: Blood Pressure Medication. See below.) °Transportation: A driver is required. You may not drive yourself after the procedure. °Blood Pressure Medicine: Do not forget to take your blood pressure medicine with a sip of water the morning of the procedure. If your Diastolic (lower reading) is above 100 mmHg, elective cases will be cancelled/rescheduled. °Blood thinners: These will need to be stopped for procedures. Notify our staff if you are taking any blood thinners. Depending on which one you take, there will be specific instructions on how and when to stop it. °Diabetics on insulin: Notify the staff so that you can be scheduled 1st case in the morning. If your diabetes requires high dose insulin, take only ½ of your normal insulin dose the morning of the procedure and notify the staff that you have done so. °Preventing infections: Shower with an antibacterial soap the morning of your procedure. °Build-up your immune system: Take 1000 mg of Vitamin C with every meal (3 times a day) the day prior to your procedure. °Antibiotics: Inform the staff if you have a condition or reason that requires you to take antibiotics before dental procedures. °Pregnancy: If you are pregnant, call and cancel the procedure. °Sickness: If  you have a cold, fever, or any active infections, call and cancel the procedure. °Arrival: You must be in the facility at least 30 minutes prior to your scheduled procedure. °Children: Do not bring children with you. °Dress appropriately: Bring dark clothing that you would not mind if they get stained. °Valuables: Do not bring any jewelry or valuables. ° °Reasons to call and reschedule or cancel your procedure: (Following these recommendations will minimize the risk of a serious complication.) °Surgeries: Avoid having procedures within 2 weeks of any surgery. (Avoid for 2 weeks before or after any surgery). °Flu Shots: Avoid having procedures within 2 weeks of a flu shots. (Avoid for 2 weeks before or after immunizations). °Barium: Avoid having a procedure within 7-10 days after having had a radiological study involving the use of radiological contrast. (Myelograms, Barium swallow or enema study). °Heart attacks: Avoid any elective procedures or surgeries for the initial 6 months after a "Myocardial Infarction" (Heart Attack). °Blood thinners: It is imperative that you stop these medications before procedures. Let us know if you if you take any blood thinner.  °Infection: Avoid procedures during or within two weeks of an infection (including chest colds or gastrointestinal problems). Symptoms associated with infections include: Localized redness, fever, chills, night sweats or profuse sweating, burning sensation when voiding, cough, congestion, stuffiness, runny nose, sore throat, diarrhea, nausea, vomiting, cold or Flu symptoms, recent or current infections. It is specially important if the infection is over the area that we intend to treat. °Heart and lung problems: Symptoms that may suggest an active cardiopulmonary problem include: cough, chest pain, breathing difficulties or shortness of breath, dizziness, ankle swelling, uncontrolled high or unusually low blood pressure, and/or palpitations. If you are    experiencing any of these symptoms, cancel your procedure and contact your primary care physician for an evaluation. ° °Remember:  °Regular Business hours are:  °Monday to Thursday 8:00 AM to 4:00 PM ° °Provider's Schedule: °Moustafa Mossa, MD:  °Procedure days: Tuesday and Thursday 7:30 AM to 4:00 PM ° °Bilal Lateef, MD:  °Procedure days: Monday and Wednesday 7:30 AM to 4:00 PM °______________________________________________________________________ ° ____________________________________________________________________________________________ ° °General Risks and Possible Complications ° °Patient Responsibilities: It is important that you read this as it is part of your informed consent. It is our duty to inform you of the risks and possible complications associated with treatments offered to you. It is your responsibility as a patient to read this and to ask questions about anything that is not clear or that you believe was not covered in this document. ° °Patient’s Rights: You have the right to refuse treatment. You also have the right to change your mind, even after initially having agreed to have the treatment done. However, under this last option, if you wait until the last second to change your mind, you may be charged for the materials used up to that point. ° °Introduction: Medicine is not an exact science. Everything in Medicine, including the lack of treatment(s), carries the potential for danger, harm, or loss (which is by definition: Risk). In Medicine, a complication is a secondary problem, condition, or disease that can aggravate an already existing one. All treatments carry the risk of possible complications. The fact that a side effects or complications occurs, does not imply that the treatment was conducted incorrectly. It must be clearly understood that these can happen even when everything is done following the highest safety standards. ° °No treatment: You can choose not to proceed with the  proposed treatment alternative. The “PRO(s)” would include: avoiding the risk of complications associated with the therapy. The “CON(s)” would include: not getting any of the treatment benefits. These benefits fall under one of three categories: diagnostic; therapeutic; and/or palliative. Diagnostic benefits include: getting information which can ultimately lead to improvement of the disease or symptom(s). Therapeutic benefits are those associated with the successful treatment of the disease. Finally, palliative benefits are those related to the decrease of the primary symptoms, without necessarily curing the condition (example: decreasing the pain from a flare-up of a chronic condition, such as incurable terminal cancer). ° °General Risks and Complications: These are associated to most interventional treatments. They can occur alone, or in combination. They fall under one of the following six (6) categories: no benefit or worsening of symptoms; bleeding; infection; nerve damage; allergic reactions; and/or death. °No benefits or worsening of symptoms: In Medicine there are no guarantees, only probabilities. No healthcare provider can ever guarantee that a medical treatment will work, they can only state the probability that it may. Furthermore, there is always the possibility that the condition may worsen, either directly, or indirectly, as a consequence of the treatment. °Bleeding: This is more common if the patient is taking a blood thinner, either prescription or over the counter (example: Goody Powders, Fish oil, Aspirin, Garlic, etc.), or if suffering a condition associated with impaired coagulation (example: Hemophilia, cirrhosis of the liver, low platelet counts, etc.). However, even if you do not have one on these, it can still happen. If you have any of these conditions, or take one of these drugs, make sure to notify your treating physician. °Infection: This is more common in patients with a compromised  immune system, either due to disease (example:   diabetes, cancer, human immunodeficiency virus [HIV], etc.), or due to medications or treatments (example: therapies used to treat cancer and rheumatological diseases). However, even if you do not have one on these, it can still happen. If you have any of these conditions, or take one of these drugs, make sure to notify your treating physician. °Nerve Damage: This is more common when the treatment is an invasive one, but it can also happen with the use of medications, such as those used in the treatment of cancer. The damage can occur to small secondary nerves, or to large primary ones, such as those in the spinal cord and brain. This damage may be temporary or permanent and it may lead to impairments that can range from temporary numbness to permanent paralysis and/or brain death. °Allergic Reactions: Any time a substance or material comes in contact with our body, there is the possibility of an allergic reaction. These can range from a mild skin rash (contact dermatitis) to a severe systemic reaction (anaphylactic reaction), which can result in death. °Death: In general, any medical intervention can result in death, most of the time due to an unforeseen complication. °____________________________________________________________________________________________  °

## 2021-04-04 ENCOUNTER — Other Ambulatory Visit: Payer: Self-pay

## 2021-04-04 ENCOUNTER — Ambulatory Visit (INDEPENDENT_AMBULATORY_CARE_PROVIDER_SITE_OTHER): Payer: BC Managed Care – PPO | Admitting: Internal Medicine

## 2021-04-04 ENCOUNTER — Other Ambulatory Visit (HOSPITAL_COMMUNITY)
Admission: RE | Admit: 2021-04-04 | Discharge: 2021-04-04 | Disposition: A | Discharge status: Home / Self Care | Payer: BC Managed Care – PPO | Source: Ambulatory Visit | Attending: Internal Medicine | Admitting: Internal Medicine

## 2021-04-04 VITALS — BP 110/70 | HR 70 | Temp 97.9°F | Resp 16 | Ht 62.0 in | Wt 122.8 lb

## 2021-04-04 DIAGNOSIS — M1611 Unilateral primary osteoarthritis, right hip: Secondary | ICD-10-CM

## 2021-04-04 DIAGNOSIS — Z124 Encounter for screening for malignant neoplasm of cervix: Secondary | ICD-10-CM | POA: Insufficient documentation

## 2021-04-04 DIAGNOSIS — Z0000 Encounter for general adult medical examination without abnormal findings: Secondary | ICD-10-CM

## 2021-04-04 DIAGNOSIS — K219 Gastro-esophageal reflux disease without esophagitis: Secondary | ICD-10-CM

## 2021-04-04 DIAGNOSIS — D649 Anemia, unspecified: Secondary | ICD-10-CM

## 2021-04-04 DIAGNOSIS — E538 Deficiency of other specified B group vitamins: Secondary | ICD-10-CM

## 2021-04-04 DIAGNOSIS — J309 Allergic rhinitis, unspecified: Secondary | ICD-10-CM

## 2021-04-04 DIAGNOSIS — M8580 Other specified disorders of bone density and structure, unspecified site: Secondary | ICD-10-CM

## 2021-04-04 DIAGNOSIS — F419 Anxiety disorder, unspecified: Secondary | ICD-10-CM

## 2021-04-04 DIAGNOSIS — Z1211 Encounter for screening for malignant neoplasm of colon: Secondary | ICD-10-CM | POA: Diagnosis not present

## 2021-04-04 DIAGNOSIS — E559 Vitamin D deficiency, unspecified: Secondary | ICD-10-CM

## 2021-04-04 DIAGNOSIS — E0590 Thyrotoxicosis, unspecified without thyrotoxic crisis or storm: Secondary | ICD-10-CM

## 2021-04-04 MED ORDER — ALPRAZOLAM 0.25 MG PO TABS: ORAL_TABLET | ORAL | 0 refills | Status: DC

## 2021-04-04 MED ORDER — CYANOCOBALAMIN 1000 MCG/ML IJ SOLN
1000.0000 ug | Freq: Once | INTRAMUSCULAR | Status: AC
  Administered 2021-04-04: 11:00:00 1000 ug via INTRAMUSCULAR

## 2021-04-04 NOTE — Progress Notes (Unsigned)
Patient ID: Shannon [name], female   DOB: 12-Aug-1965, 56 y.o.   MRN: [id number]   Subjective:    Patient ID: Shannon [name], female    DOB: 03-24-1965, 56 y.o.   MRN: [id number]  This visit occurred during the SARS-CoV-2 public health emergency.  Safety protocols were in place, including screening questions prior to the visit, additional usage of staff PPE, and extensive cleaning of exam room while observing appropriate contact time as indicated for disinfecting solutions.   Patient here for a physical exam.   Chief Complaint  Patient presents with   Annual Exam   .   HPI Here for physical exam.  Increased stress recently.  Family stress.  Discussed.  She feels she has good support.  Does not feel needs anything more at this time.  Rarely takes xanax.  Request refill to have if needed.  Stays active.  No chest pain or sob reported.  No abdominal pain or bowel change reported.  No vaginal problems.  Seen at pain clinic yesterday - right hip pain.  Xray with OA.  Planning for injection next week.     Past Medical History:  Diagnosis Date   Anemia    Anxiety    Breast pain, right 11/07/2012   Breathing difficulty 06/20/2014   Environmental allergies    GERD (gastroesophageal reflux disease)    Hiatal hernia    small   History of migraine headaches    Hyperthyroidism    s/p ablation   Vaginitis 01/16/2013   Past Surgical History:  Procedure Laterality Date   BREAST BIOPSY Right 12/05/2015   neg   CESAREAN SECTION  11/98   Dr Roena Malady   CHOLECYSTECTOMY  11/05   Dr Pat Patrick   JOINT REPLACEMENT Left 2017   hip   NASAL SINUS SURGERY  11/93   SEPTOPLASTY  5/90   Dr Rossie Muskrat   TUBAL LIGATION  05/8097   UMBILICAL HERNIA REPAIR  02/1998   Family History  Problem Relation Age of Onset   Heart disease Father    Hypertension Father    Thyroid disease Father    Hypercholesterolemia Father    Kidney cancer Father    Leukemia Father        hairy cell   Hypercholesterolemia Mother     Thyroid disease Mother    Breast cancer Mother 11   Breast cancer Other        maternal great grandmother   Heart disease Maternal Grandfather        MI - age 63   Prostate cancer Paternal Grandfather    Pancreatic cancer Paternal Grandfather    Lung cancer Paternal Grandfather    Bladder Cancer Neg Hx    Social History   Socioeconomic History   Marital status: Married    Spouse name: Not on file   Number of children: 1   Years of education: Not on file   Highest education level: Not on file  Occupational History   Not on file  Tobacco Use   Smoking status: Former    Types: Cigarettes    Quit date: 01/28/2000    Years since quitting: 21.1   Smokeless tobacco: Never  Substance and Sexual Activity   Alcohol use: No    Alcohol/week: 0.0 standard drinks   Drug use: No   Sexual activity: Not on file  Other Topics Concern   Not on file  Social History Narrative   Not on file   Social Determinants of Health  Financial Resource Strain: Not on file  Food Insecurity: Not on file  Transportation Needs: Not on file  Physical Activity: Not on file  Stress: Not on file  Social Connections: Not on file     Review of Systems  Constitutional:  Negative for appetite change and unexpected weight change.  HENT:  Negative for congestion, sinus pressure and sore throat.   Eyes:  Negative for pain and visual disturbance.  Respiratory:  Negative for cough, chest tightness and shortness of breath.   Cardiovascular:  Negative for chest pain, palpitations and leg swelling.  Gastrointestinal:  Negative for abdominal pain, diarrhea, nausea and vomiting.  Genitourinary:  Negative for difficulty urinating and dysuria.  Musculoskeletal:  Negative for joint swelling and myalgias.       Right hip pain.   Skin:  Negative for color change and rash.  Neurological:  Negative for dizziness, light-headedness and headaches.  Hematological:  Negative for adenopathy. Does not bruise/bleed easily.   Psychiatric/Behavioral:  Negative for agitation and dysphoric mood.       Objective:     BP 110/70    Pulse 70    Temp 97.9 F (36.6 C)    Resp 16    Ht '5\' 2"'$  (1.575 m)    Wt 122 lb 12.8 oz (55.7 kg)    LMP 02/12/2013    SpO2 99%    BMI 22.46 kg/m  Wt Readings from Last 3 Encounters:  04/04/21 122 lb 12.8 oz (55.7 kg)  04/03/21 122 lb (55.3 kg)  11/29/20 127 lb (57.6 kg)    Physical Exam Vitals reviewed.  Constitutional:      General: She is not in acute distress.    Appearance: Normal appearance. She is well-developed.  HENT:     Head: Normocephalic and atraumatic.     Right Ear: External ear normal.     Left Ear: External ear normal.  Eyes:     General: No scleral icterus.       Right eye: No discharge.        Left eye: No discharge.     Conjunctiva/sclera: Conjunctivae normal.  Neck:     Thyroid: No thyromegaly.  Cardiovascular:     Rate and Rhythm: Normal rate and regular rhythm.  Pulmonary:     Effort: No tachypnea, accessory muscle usage or respiratory distress.     Breath sounds: Normal breath sounds. No decreased breath sounds or wheezing.  Chest:  Breasts:    Right: No inverted nipple, mass, nipple discharge or tenderness (no axillary adenopathy).     Left: No inverted nipple, mass, nipple discharge or tenderness (no axilarry adenopathy).  Abdominal:     General: Bowel sounds are normal.     Palpations: Abdomen is soft.     Tenderness: There is no abdominal tenderness.  Genitourinary:    Comments: Normal external genitalia.  Vaginal vault without lesions.  Cervix identified.  Pap smear performed.  Could not appreciate any adnexal masses or tenderness.   Musculoskeletal:        General: No swelling or tenderness.     Cervical back: Neck supple.  Lymphadenopathy:     Cervical: No cervical adenopathy.  Skin:    Findings: No erythema or rash.  Neurological:     Mental Status: She is alert and oriented to person, place, and time.  Psychiatric:        Mood  and Affect: Mood normal.        Behavior: Behavior normal.  Outpatient Encounter Medications as of 04/04/2021  Medication Sig   acetaminophen (TYLENOL) 500 MG tablet Take 1,000 mg by mouth every 6 (six) hours as needed for moderate pain.   ALPRAZolam (XANAX) 0.25 MG tablet TAKE ONE TABLET DAILY IF NEEDED FOR ANXIETY   cholecalciferol (VITAMIN D3) 25 MCG (1000 UNIT) tablet Take 1,000 Units by mouth daily.   Cyanocobalamin (VITAMIN B-12) 1000 MCG/15ML LIQD Take 1,000 mcg by mouth every 30 (thirty) days.   EPINEPHrine 0.3 mg/0.3 mL IJ SOAJ injection    Estriol 10 % CREA by Does not apply route.   famotidine (PEPCID) 40 MG tablet Take by mouth.   omeprazole (PRILOSEC) 40 MG capsule Take 40 mg by mouth daily.   ondansetron (ZOFRAN ODT) 4 MG disintegrating tablet Take 1 tablet (4 mg total) by mouth 2 (two) times daily as needed for nausea or vomiting.   SUMAtriptan (IMITREX) 100 MG tablet    [DISCONTINUED] ALPRAZolam (XANAX) 0.25 MG tablet TAKE ONE TABLET DAILY IF NEEDED FOR ANXIETY   No facility-administered encounter medications on file as of 04/04/2021.     Lab Results  Component Value Date   WBC 5.6 04/02/2021   HGB 12.9 04/02/2021   HCT 38.3 04/02/2021   PLT 234.0 04/02/2021   GLUCOSE 77 04/02/2021   CHOL 195 04/02/2021   TRIG 110.0 04/02/2021   HDL 77.10 04/02/2021   LDLCALC 96 04/02/2021   ALT 15 04/02/2021   AST 18 04/02/2021   NA 141 04/02/2021   K 3.9 04/02/2021   CL 105 04/02/2021   CREATININE 0.59 04/02/2021   BUN 12 04/02/2021   CO2 30 04/02/2021   TSH 1.65 04/02/2021    DG HIP UNILAT W OR W/O PELVIS 2-3 VIEWS RIGHT  Result Date: 04/03/2021 CLINICAL DATA:  Right hip pain EXAM: DG HIP (WITH OR WITHOUT PELVIS) 2-3V RIGHT COMPARISON:  None. FINDINGS: There is no evidence of acute fracture. There is mild right hip degenerative change. Left hip arthroplasty noted. Probable calcified uterine fibroid overlying the pelvis. IMPRESSION: Mild right hip osteoarthritis.  No  acute osseous abnormality. Electronically Signed   By: Maurine Simmering M.D.   On: 04/03/2021 16:42       Assessment & Plan:   Problem List Items Addressed This Visit     Health care maintenance    Physical today 04/04/21.  PAP 03/29/20 - negative with negative HPV. Colonoscopy 11/2014.  Recommended f/u in 11/2024.  Mammogram 10/17/20 - Birads I.  The 10-year ASCVD risk score (Arnett DK, et al., 2019) is: 1.3%   Values used to calculate the score:     Age: 50 years     Sex: Female     Is Non-Hispanic African American: No     Diabetic: No     Tobacco smoker: No     Systolic Blood Pressure: 128 mmHg     Is BP treated: No     HDL Cholesterol: 77.1 mg/dL     Total Cholesterol: 195 mg/dL      Other Visit Diagnoses     Cervical cancer screening    -  Primary   Relevant Orders   Cytology - PAP( [facility])   Colon cancer screening       Relevant Orders   Fecal occult blood, imunochemical        [name], MD

## 2021-04-04 NOTE — Assessment & Plan Note (Addendum)
Physical today 04/04/21.  PAP 03/29/20 - negative with negative HPV. Colonoscopy 11/2014.  Recommended f/u in 11/2024.  Hemoccult cards given.  Mammogram 10/17/20 - Birads I.  The 10-year ASCVD risk score (Arnett DK, et al., 2019) is: 1.3%   Values used to calculate the score:     Age: 56 years     Sex: Female     Is Non-Hispanic African American: No     Diabetic: No     Tobacco smoker: No     Systolic Blood Pressure: 110 mmHg     Is BP treated: No     HDL Cholesterol: 77.1 mg/dL     Total Cholesterol: 195 mg/dL  Low cholesterol diet and exercise.

## 2021-04-05 LAB — CYTOLOGY - PAP
Comment: NEGATIVE
Diagnosis: NEGATIVE
High risk HPV: NEGATIVE

## 2021-04-09 ENCOUNTER — Ambulatory Visit
Admission: RE | Admit: 2021-04-09 | Discharge: 2021-04-09 | Disposition: A | Discharge status: Home / Self Care | Payer: BC Managed Care – PPO | Source: Ambulatory Visit | Attending: Pain Medicine | Admitting: Pain Medicine

## 2021-04-09 ENCOUNTER — Encounter: Payer: Self-pay | Admitting: Pain Medicine

## 2021-04-09 ENCOUNTER — Ambulatory Visit (HOSPITAL_BASED_OUTPATIENT_CLINIC_OR_DEPARTMENT_OTHER): Payer: BC Managed Care – PPO | Admitting: Pain Medicine

## 2021-04-09 ENCOUNTER — Other Ambulatory Visit: Payer: Self-pay

## 2021-04-09 ENCOUNTER — Other Ambulatory Visit (INDEPENDENT_AMBULATORY_CARE_PROVIDER_SITE_OTHER): Payer: BC Managed Care – PPO

## 2021-04-09 VITALS — BP 131/82 | HR 76 | Temp 98.1°F | Resp 16 | Ht 62.0 in | Wt 122.0 lb

## 2021-04-09 DIAGNOSIS — G8929 Other chronic pain: Secondary | ICD-10-CM | POA: Insufficient documentation

## 2021-04-09 DIAGNOSIS — M25551 Pain in right hip: Secondary | ICD-10-CM

## 2021-04-09 DIAGNOSIS — M1611 Unilateral primary osteoarthritis, right hip: Secondary | ICD-10-CM | POA: Insufficient documentation

## 2021-04-09 DIAGNOSIS — M25651 Stiffness of right hip, not elsewhere classified: Secondary | ICD-10-CM | POA: Diagnosis not present

## 2021-04-09 DIAGNOSIS — Z1211 Encounter for screening for malignant neoplasm of colon: Secondary | ICD-10-CM

## 2021-04-09 LAB — FECAL OCCULT BLOOD, IMMUNOCHEMICAL: Fecal Occult Bld: NEGATIVE

## 2021-04-09 MED ORDER — ROPIVACAINE HCL 2 MG/ML IJ SOLN
9.0000 mL | Freq: Once | INTRAMUSCULAR | Status: AC
Start: 2021-04-09 — End: 2021-04-09
  Administered 2021-04-09: 9 mL via INTRA_ARTICULAR

## 2021-04-09 MED ORDER — IOHEXOL 180 MG/ML  SOLN
10.0000 mL | Freq: Once | INTRAMUSCULAR | Status: AC
  Administered 2021-04-09: 10 mL via INTRA_ARTICULAR

## 2021-04-09 MED ORDER — LIDOCAINE HCL 2 % IJ SOLN
20.0000 mL | Freq: Once | INTRAMUSCULAR | Status: AC
  Administered 2021-04-09: 400 mg

## 2021-04-09 MED ORDER — METHYLPREDNISOLONE ACETATE 80 MG/ML IJ SUSP
INTRAMUSCULAR | Status: AC
  Filled 2021-04-09: qty 1

## 2021-04-09 MED ORDER — PENTAFLUOROPROP-TETRAFLUOROETH EX AERO
INHALATION_SPRAY | Freq: Once | CUTANEOUS | Status: AC
  Filled 2021-04-09: qty 116

## 2021-04-09 MED ORDER — ROPIVACAINE HCL 2 MG/ML IJ SOLN
INTRAMUSCULAR | Status: AC
  Filled 2021-04-09: qty 20

## 2021-04-09 MED ORDER — LIDOCAINE HCL 2 % IJ SOLN
INTRAMUSCULAR | Status: AC
  Filled 2021-04-09: qty 20

## 2021-04-09 MED ORDER — METHYLPREDNISOLONE ACETATE 80 MG/ML IJ SUSP
80.0000 mg | Freq: Once | INTRAMUSCULAR | Status: AC
  Administered 2021-04-09: 80 mg via INTRA_ARTICULAR

## 2021-04-09 NOTE — Progress Notes (Signed)
PROVIDER NOTE: Interpretation of information contained herein should be left to medically-trained personnel. Specific patient instructions are provided elsewhere under "Patient Instructions" section of medical record. This document was created in part using STT-dictation technology, any transcriptional errors that may result from this process are unintentional.  ?Patient: Shannon [name] ?Type: Established ?DOB: 1966-01-02 ?MRN: [id number] ?PCP: [name], MD  Service: Procedure ?DOS: 04/09/2021 ?Setting: Ambulatory ?Location: Ambulatory outpatient facility ?Delivery: Face-to-face Provider: Gaspar Cola, MD ?Specialty: Interventional Pain Management ?Specialty designation: 09 ?Location: Outpatient facility ?Ref. Prov.: [name], MD   ? ?Primary Reason for Visit: Interventional Pain Management Treatment. ?CC: Hip Pain (Right ) ? ?  ?Procedure #1:  Anesthesia, Analgesia, Anxiolysis:  ?Type: Intra-Articular Hip Injection #1  ?Primary Purpose: Diagnostic ?Region: Posterolateral hip joint area. ?Level: Lower pelvic and hip joint level. ?Target Area: Superior aspect of the hip joint cavity, going thru the superior portion of the capsular ligament. ?Approach: Posterolateral approach. ?Laterality: Right  Anesthesia: Local (1-2% Lidocaine)  ?Anxiolysis: None  ?Sedation: None  ?Guidance: Fluoroscopy         ? ? ?Position: Lateral Decubitus with bad side up ?Area Prepped: Entire Posterolateral hip area. ?DuraPrep (Iodine Povacrylex [0.7% available iodine] and Isopropyl Alcohol, 74% w/w)  ? ?Procedure #2:    ?Type: Trochanteric Bursa Injection #1  ?Primary Purpose: Diagnostic ?Region: Upper (proximal) Femoral Region ?Level: Hip Joint ?Target Area: Superior aspect of the hip joint cavity, going thru the superior portion of the capsular ligament. ?Approach: Posterolateral approach ?Laterality: Right    ? ?1. Chronic hip pain (Right)   ?2. Osteoarthritis of hip (Right)   ?3. Impaired range of motion of hip (Right)    ?4. Chronic hip pain, right   ?5. Primary osteoarthritis of right hip   ?6. Impaired range of motion of right hip   ? ?NAS-11 Pain score:  ? Pre-procedure: 3 /10  ? Post-procedure: 0-No pain/10  ? ?  ?Pre-op H&P Assessment:  ?Ms. Harney is a 56 y.o. (year old), female patient, seen today for interventional treatment. She  has a past surgical history that includes Septoplasty (5/90); Nasal sinus surgery (11/93); Cesarean section (11/98); Tubal ligation (02/1998); Umbilical hernia repair (02/1998); Cholecystectomy (11/05); Joint replacement (Left, 2017); and Breast biopsy (Right, 12/05/2015). Ms. Mackel has a current medication list which includes the following prescription(s): acetaminophen, alprazolam, cholecalciferol, vitamin b-12, epinephrine, estriol, famotidine, omeprazole, ondansetron, and sumatriptan, and the following Facility-Administered Medications: iohexol, lidocaine, methylprednisolone acetate, pentafluoroprop-tetrafluoroeth, and ropivacaine (pf) 2 mg/ml (0.2%). Her primarily concern today is the Hip Pain (Right ) ? ?Initial Vital Signs:  ?Pulse/HCG Rate: 94  ?Temp: 98.1 ?F (36.7 ?C) ?Resp: 16 ?BP: 111/66 ?SpO2: 100 % ? ?BMI: Estimated body mass index is 22.31 kg/m? as calculated from the following: ?  Height as of this encounter: '5\' 2"'$  (1.575 m). ?  Weight as of this encounter: 122 lb (55.3 kg). ? ?Risk Assessment: ?Allergies: Reviewed. She is allergic to codeine, decongest-aid [pseudoephedrine], flagyl [metronidazole], and sulfa antibiotics.  ?Allergy Precautions: None required ?Coagulopathies: Reviewed. None identified.  ?Blood-thinner therapy: None at this time ?Active Infection(s): Reviewed. None identified. Ms. Hegel is afebrile ? ?Site Confirmation: Ms. Romanek was asked to confirm the procedure and laterality before marking the site ?Procedure checklist: Completed ?Consent: Before the procedure and under the influence of no sedative(s), amnesic(s), or anxiolytics, the patient was informed of the  treatment options, risks and possible complications. To fulfill our ethical and legal obligations, as recommended by the American Medical Association's Code of Ethics, I  have informed the patient of my clinical impression; the nature and purpose of the treatment or procedure; the risks, benefits, and possible complications of the intervention; the alternatives, including doing nothing; the risk(s) and benefit(s) of the alternative treatment(s) or procedure(s); and the risk(s) and benefit(s) of doing nothing. ?The patient was provided information about the general risks and possible complications associated with the procedure. These may include, but are not limited to: failure to achieve desired goals, infection, bleeding, organ or nerve damage, allergic reactions, paralysis, and death. ?In addition, the patient was informed of those risks and complications associated to the procedure, such as failure to decrease pain; infection; bleeding; organ or nerve damage with subsequent damage to sensory, motor, and/or autonomic systems, resulting in permanent pain, numbness, and/or weakness of one or several areas of the body; allergic reactions; (i.e.: anaphylactic reaction); and/or death. ?Furthermore, the patient was informed of those risks and complications associated with the medications. These include, but are not limited to: allergic reactions (i.e.: anaphylactic or anaphylactoid reaction(s)); adrenal axis suppression; blood sugar elevation that in diabetics may result in ketoacidosis or comma; water retention that in patients with history of congestive heart failure may result in shortness of breath, pulmonary edema, and decompensation with resultant heart failure; weight gain; swelling or edema; medication-induced neural toxicity; particulate matter embolism and blood vessel occlusion with resultant organ, and/or nervous system infarction; and/or aseptic necrosis of one or more joints. ?Finally, the patient was  informed that Medicine is not an exact science; therefore, there is also the possibility of unforeseen or unpredictable risks and/or possible complications that may result in a catastrophic outcome. The patient indicated having understood very clearly. We have given the patient no guarantees and we have made no promises. Enough time was given to the patient to ask questions, all of which were answered to the patient's satisfaction. Ms. Muldrow has indicated that she wanted to continue with the procedure. ?Attestation: I, the ordering provider, attest that I have discussed with the patient the benefits, risks, side-effects, alternatives, likelihood of achieving goals, and potential problems during recovery for the procedure that I have provided informed consent. ?Date  Time: 04/09/2021  9:45 AM ? ?Pre-Procedure Preparation:  ?Monitoring: As per clinic protocol. Respiration, ETCO2, SpO2, BP, heart rate and rhythm monitor placed and checked for adequate function ?Safety Precautions: Patient was assessed for positional comfort and pressure points before starting the procedure. ?Time-out: I initiated and conducted the "Time-out" before starting the procedure, as per protocol. The patient was asked to participate by confirming the accuracy of the "Time Out" information. Verification of the correct person, site, and procedure were performed and confirmed by me, the nursing staff, and the patient. "Time-out" conducted as per Joint Commission's Universal Protocol (UP.01.01.01). ?Time: 1007 ? ?Description of Procedure #1:  ?Safety Precautions: Aspiration looking for blood return was conducted prior to all injections. At no point did we inject any substances, as a needle was being advanced. No attempts were made at seeking any paresthesias. Safe injection practices and needle disposal techniques used. Medications properly checked for expiration dates. SDV (single dose vial) medications used. ?Description of the Procedure:  Protocol guidelines were followed. The patient was placed in position over the fluoroscopy table. The target area was identified and the area prepped in the usual manner. Skin & deeper tissues infiltrated with local

## 2021-04-09 NOTE — Progress Notes (Signed)
Safety precautions to be maintained throughout the outpatient stay will include: orient to surroundings, keep bed in low position, maintain call bell within reach at all times, provide assistance with transfer out of bed and ambulation.  

## 2021-04-09 NOTE — Patient Instructions (Signed)

## 2021-04-10 ENCOUNTER — Telehealth: Payer: Self-pay | Admitting: *Deleted

## 2021-04-10 NOTE — Telephone Encounter (Signed)
No problems post procedure. 

## 2021-04-11 DIAGNOSIS — J301 Allergic rhinitis due to pollen: Secondary | ICD-10-CM | POA: Diagnosis not present

## 2021-04-11 DIAGNOSIS — J3089 Other allergic rhinitis: Secondary | ICD-10-CM | POA: Diagnosis not present

## 2021-04-11 DIAGNOSIS — K219 Gastro-esophageal reflux disease without esophagitis: Secondary | ICD-10-CM | POA: Diagnosis not present

## 2021-04-14 ENCOUNTER — Encounter: Payer: Self-pay | Admitting: Internal Medicine

## 2021-04-14 NOTE — Assessment & Plan Note (Signed)
Sees an allergist.  Recently instructed to start rhinocort.  Continue antihistamine.   ?

## 2021-04-14 NOTE — Assessment & Plan Note (Signed)
Stable.  On prilosec.  

## 2021-04-14 NOTE — Assessment & Plan Note (Signed)
Follow cbc.  

## 2021-04-14 NOTE — Assessment & Plan Note (Signed)
Evaluated pain clinic.  Planning injection next week.  ?

## 2021-04-14 NOTE — Assessment & Plan Note (Signed)
Low vitamin D level.  Instructed on oral vitamin D supplements.  ?

## 2021-04-14 NOTE — Assessment & Plan Note (Signed)
S/p ablation.  Follow tsh.  ?

## 2021-04-14 NOTE — Assessment & Plan Note (Signed)
Discussed.  Will notify me if feels needs any further intervention.  Follow.  ?

## 2021-04-14 NOTE — Assessment & Plan Note (Signed)
Bone density 11/2019 - osteopenia (-1.5).  Continue calcium, vitamin D and weight bearing exercise.   ?

## 2021-04-16 DIAGNOSIS — J301 Allergic rhinitis due to pollen: Secondary | ICD-10-CM | POA: Diagnosis not present

## 2021-04-16 DIAGNOSIS — J3089 Other allergic rhinitis: Secondary | ICD-10-CM | POA: Diagnosis not present

## 2021-04-18 DIAGNOSIS — J3089 Other allergic rhinitis: Secondary | ICD-10-CM | POA: Diagnosis not present

## 2021-04-18 DIAGNOSIS — J301 Allergic rhinitis due to pollen: Secondary | ICD-10-CM | POA: Diagnosis not present

## 2021-04-23 DIAGNOSIS — J301 Allergic rhinitis due to pollen: Secondary | ICD-10-CM | POA: Diagnosis not present

## 2021-04-23 DIAGNOSIS — J3089 Other allergic rhinitis: Secondary | ICD-10-CM | POA: Diagnosis not present

## 2021-04-25 ENCOUNTER — Ambulatory Visit: Payer: BC Managed Care – PPO | Attending: Pain Medicine | Admitting: Pain Medicine

## 2021-04-25 DIAGNOSIS — M1611 Unilateral primary osteoarthritis, right hip: Secondary | ICD-10-CM

## 2021-04-25 DIAGNOSIS — M7061 Trochanteric bursitis, right hip: Secondary | ICD-10-CM | POA: Insufficient documentation

## 2021-04-25 DIAGNOSIS — M7071 Other bursitis of hip, right hip: Secondary | ICD-10-CM | POA: Insufficient documentation

## 2021-04-25 DIAGNOSIS — M25551 Pain in right hip: Secondary | ICD-10-CM | POA: Diagnosis not present

## 2021-04-25 DIAGNOSIS — G8929 Other chronic pain: Secondary | ICD-10-CM | POA: Diagnosis not present

## 2021-04-25 NOTE — Progress Notes (Signed)
Patient: Shannon [name]  Service Category: E/M  Provider: Gaspar Cola, MD  ?DOB: 11-11-1965  DOS: 04/25/2021  Location: Office  ?MRN: [id number]  Setting: Ambulatory outpatient  Referring Provider: [name], MD  ?Type: Established Patient  Specialty: Interventional Pain Management  PCP: [name], MD  ?Location: Remote location  Delivery: TeleHealth    ? ?Virtual Encounter - Pain Management ?PROVIDER NOTE: Information contained herein reflects review and annotations entered in association with encounter. Interpretation of such information and data should be left to medically-trained personnel. Information provided to patient can be located elsewhere in the medical record under "Patient Instructions". Document created using STT-dictation technology, any transcriptional errors that may result from process are unintentional.  ?  ?Contact & Pharmacy ?Preferred: [phone] ?Home: [phone] (home) ?Mobile: [phone] (mobile) ?E-mail: [email or web address]  ?[facility] - [place], [place] - [address] ?[place] [address] ?Phone: [phone] Fax: [phone] ?  ?Pre-screening  ?Ms. Serena offered "in-person" vs "virtual" encounter. She indicated preferring virtual for this encounter.  ? ?Reason ?COVID-19*  Social distancing based on CDC and AMA recommendations.  ? ?I contacted [name] on 04/25/2021 via telephone.      I clearly identified myself as Gaspar Cola, MD. I verified that I was speaking with the correct person using two identifiers (Name: [name], and date of birth: 01-May-1965). ? ?Consent ?I sought verbal advanced consent from [name] for virtual visit interactions. I informed Ms. Oconnor of possible security and privacy concerns, risks, and limitations associated with providing "not-in-person" medical evaluation and management services. I also informed Ms. Steier of the availability of "in-person" appointments. Finally, I informed  her that there would be a charge for the virtual visit and that she could be  personally, fully or partially, financially responsible for it. Ms. Chivers expressed understanding and agreed to proceed.  ? ?Historic Elements   ?[name] is a 56 y.o. year old, female patient evaluated today after our last contact on 04/09/2021. [name]  has a past medical history of Anemia, Anxiety, Breast pain, right (11/07/2012), Breathing difficulty (06/20/2014), Environmental allergies, GERD (gastroesophageal reflux disease), Hiatal hernia, History of migraine headaches, Hyperthyroidism, and Vaginitis (01/16/2013). She also  has a past surgical history that includes Septoplasty (5/90); Nasal sinus surgery (11/93); Cesarean section (11/98); Tubal ligation (02/1998); Umbilical hernia repair (02/1998); Cholecystectomy (11/05); Joint replacement (Left, 2017); and Breast biopsy (Right, 12/05/2015). [name] has a current medication list which includes the following prescription(s): acetaminophen, alprazolam, cholecalciferol, vitamin b-12, epinephrine, estriol, famotidine, omeprazole, ondansetron, and sumatriptan. She  reports that she quit smoking about 21 years ago. Her smoking use included cigarettes. She has never used smokeless tobacco. She reports that she does not drink alcohol and does not use drugs. [name] is allergic to codeine, decongest-aid [pseudoephedrine], flagyl [metronidazole], and sulfa antibiotics.  ? ?HPI  ?Today, she is being contacted for a post-procedure assessment.  The patient indicates having attained 100% relief of the pain for the duration of the local anesthetic that seems to have persisted tend she is currently undergoing an ongoing 100% relief of her hip pain.  Although she just started experiencing a little bit of pain in the lower back, she refers that she will keep an eye on it and if need be, she will give me a call. ? ?Post-procedure evaluation  ?  ?Procedure #1:  Anesthesia, Analgesia,  Anxiolysis:  ?Type: Intra-Articular Hip Injection #1  ?Primary Purpose: Diagnostic ?  Region: Posterolateral hip joint area. ?Level: Lower pelvic and hip joint level. ?Target Area: Superior aspect of the hip joint cavity, going thru the superior portion of the capsular ligament. ?Approach: Posterolateral approach. ?Laterality: Right  Anesthesia: Local (1-2% Lidocaine)  ?Anxiolysis: None  ?Sedation: None  ?Guidance: Fluoroscopy         ? ? ?Position: Lateral Decubitus with bad side up ?Area Prepped: Entire Posterolateral hip area. ?DuraPrep (Iodine Povacrylex [0.7% available iodine] and Isopropyl Alcohol, 74% w/w)  ? ?Procedure #2:    ?Type: Trochanteric Bursa Injection #1  ?Primary Purpose: Diagnostic ?Region: Upper (proximal) Femoral Region ?Level: Hip Joint ?Target Area: Superior aspect of the hip joint cavity, going thru the superior portion of the capsular ligament. ?Approach: Posterolateral approach ?Laterality: Right    ? ?1. Chronic hip pain (Right)   ?2. Osteoarthritis of hip (Right)   ?3. Impaired range of motion of hip (Right)   ?4. Chronic hip pain, right   ?5. Primary osteoarthritis of right hip   ?6. Impaired range of motion of right hip   ? ?NAS-11 Pain score:  ? Pre-procedure: 3 /10  ? Post-procedure: 0-No pain/10  ? ?  ?Effectiveness:  ?Initial hour after procedure: 100 %. ?Subsequent 4-6 hours post-procedure: 100 %. ?Analgesia past initial 6 hours: 100 %. ?Ongoing improvement:  ?Analgesic: The patient indicates having an ongoing 100% relief of her hip pain.  She refers currently experiencing a little bit of pain that seems to be recurring in the area of the SI joint, but it is something that just started and is unrelated to her hip.  She indicated that she will keep an eye on it and if it continues to bother her then she will give me a call. ?Function: Ms. Recendiz reports improvement in function ?ROM: Ms. Fullenwider reports improvement in ROM ? ?Pharmacotherapy Assessment  ? ?Opioid Analgesic: None from our  practice.  ? ?Monitoring: ?[place] PMP: PDMP reviewed during this encounter.       ?Pharmacotherapy: No side-effects or adverse reactions reported. ?Compliance: No problems identified. ?Effectiveness: Clinically acceptable. ?Plan: Refer to "POC". UDS: No results found for: SUMMARY  ? ?Laboratory Chemistry Profile  ? ?Renal ?Lab Results  ?Component Value Date  ? BUN 12 04/02/2021  ? CREATININE 0.59 04/02/2021  ? GFR 99.95 04/02/2021  ? GFRAA >60 05/02/2014  ? GFRNONAA >60 05/02/2014  ?  Hepatic ?Lab Results  ?Component Value Date  ? AST 18 04/02/2021  ? ALT 15 04/02/2021  ? ALBUMIN 4.3 04/02/2021  ? ALKPHOS 66 04/02/2021  ?  ?Electrolytes ?Lab Results  ?Component Value Date  ? NA 141 04/02/2021  ? K 3.9 04/02/2021  ? CL 105 04/02/2021  ? CALCIUM 9.6 04/02/2021  ?  Bone ?Lab Results  ?Component Value Date  ? VD25OH 21.22 (L) 04/02/2021  ? 25OHVITD1 28 (L) 10/05/2017  ? 09GGEZMO2 <1.0 10/05/2017  ? 25OHVITD3 28 10/05/2017  ?  ?Inflammation (CRP: Acute Phase) (ESR: Chronic Phase) ?Lab Results  ?Component Value Date  ? CRP 2 10/05/2017  ? ESRSEDRATE 7 10/05/2017  ?    ?  ? ?Note: Above Lab results reviewed. ? ?Imaging  ?[place] C-ARM 1-60 MIN NO REPORT ?Fluoro was used, but no Radiologist interpretation will be provided.  ?Please refer to "NOTES" tab for provider progress note. ? ?Assessment  ?The primary encounter diagnosis was Chronic hip pain (Right). Diagnoses of Greater trochanteric bursitis (Right) and Osteoarthritis of hip (Right) were also pertinent to this visit. ? ?Plan of Care  ?Problem-specific:  ?  No problem-specific Assessment & Plan notes found for this encounter. ? ?Ms. MECHELL GIRGIS has a current medication list which includes the following long-term medication(s): famotidine and sumatriptan. ? ?Pharmacotherapy (Medications Ordered): ?No orders of the defined types were placed in this encounter. ? ?Orders:  ?No orders of the defined types were placed in this encounter. ? ?Follow-up plan:   ?No  follow-ups on file.   ?  ?Interventional Therapies  ?Risk  Complexity Considerations:   ?Estimated body mass index is 22.31 kg/m? as calculated from the following: ?  Height as of this encounter: _0  (1.575 m). ?

## 2021-04-29 DIAGNOSIS — G5603 Carpal tunnel syndrome, bilateral upper limbs: Secondary | ICD-10-CM | POA: Diagnosis not present

## 2021-04-30 DIAGNOSIS — J3089 Other allergic rhinitis: Secondary | ICD-10-CM | POA: Diagnosis not present

## 2021-04-30 DIAGNOSIS — J301 Allergic rhinitis due to pollen: Secondary | ICD-10-CM | POA: Diagnosis not present

## 2021-04-30 DIAGNOSIS — J3081 Allergic rhinitis due to animal (cat) (dog) hair and dander: Secondary | ICD-10-CM | POA: Diagnosis not present

## 2021-05-06 ENCOUNTER — Ambulatory Visit (INDEPENDENT_AMBULATORY_CARE_PROVIDER_SITE_OTHER): Payer: BC Managed Care – PPO

## 2021-05-06 DIAGNOSIS — E538 Deficiency of other specified B group vitamins: Secondary | ICD-10-CM | POA: Diagnosis not present

## 2021-05-06 MED ORDER — CYANOCOBALAMIN 1000 MCG/ML IJ SOLN
1000.0000 ug | Freq: Once | INTRAMUSCULAR | Status: AC
  Administered 2021-05-06: 1000 ug via INTRAMUSCULAR

## 2021-05-06 NOTE — Progress Notes (Signed)
Pt arrived for B12 injection, given in R deltoid. Pt tolerated injection well, showed no signs of distress nor voiced any concerns.  

## 2021-05-07 DIAGNOSIS — J301 Allergic rhinitis due to pollen: Secondary | ICD-10-CM | POA: Diagnosis not present

## 2021-05-07 DIAGNOSIS — J3089 Other allergic rhinitis: Secondary | ICD-10-CM | POA: Diagnosis not present

## 2021-05-14 DIAGNOSIS — J3089 Other allergic rhinitis: Secondary | ICD-10-CM | POA: Diagnosis not present

## 2021-05-14 DIAGNOSIS — J301 Allergic rhinitis due to pollen: Secondary | ICD-10-CM | POA: Diagnosis not present

## 2021-05-15 ENCOUNTER — Ambulatory Visit: Payer: BC Managed Care – PPO | Admitting: Internal Medicine

## 2021-05-15 ENCOUNTER — Encounter: Payer: Self-pay | Admitting: Internal Medicine

## 2021-05-15 VITALS — BP 114/78 | HR 82 | Temp 98.5°F | Ht 62.0 in | Wt 123.6 lb

## 2021-05-15 DIAGNOSIS — H65111 Acute and subacute allergic otitis media (mucoid) (sanguinous) (serous), right ear: Secondary | ICD-10-CM

## 2021-05-15 DIAGNOSIS — H9201 Otalgia, right ear: Secondary | ICD-10-CM | POA: Diagnosis not present

## 2021-05-15 MED ORDER — CIPROFLOXACIN-DEXAMETHASONE 0.3-0.1 % OT SUSP: 4.0000 [drp] | Freq: Two times a day (BID) | OTIC | 0 refills | Status: DC

## 2021-05-15 NOTE — Patient Instructions (Addendum)
Call Dr. Kathyrn Sheriff if ear pain continues  ? ?Earache, Adult ?An earache, or ear pain, can be caused by many things, including: ?An infection. ?Ear wax buildup. ?Ear pressure. ?Something in the ear that should not be there (foreign body). ?A sore throat. ?Tooth problems. ?Jaw problems. ?Treatment of the earache will depend on the cause. If the cause is not clear or cannot be determined, you may need to watch your symptoms until your earache goes away or until a cause is found. ?Follow these instructions at home: ?Medicines ?Take or apply over-the-counter and prescription medicines only as told by your health care provider. ?If you were prescribed an antibiotic medicine, use it as told by your health care provider. Do not stop using the antibiotic even if you start to feel better. ?Do not put anything in your ear other than medicine that is prescribed by your health care provider. ?Managing pain ?If directed, apply heat to the affected area as often as told by your health care provider. Use the heat source that your health care provider recommends, such as a moist heat pack or a heating pad. ?Place a towel between your skin and the heat source. ?Leave the heat on for 20-30 minutes. ?Remove the heat if your skin turns bright red. This is especially important if you are unable to feel pain, heat, or cold. You may have a greater risk of getting burned. ?If directed, put ice on the affected area as often as told by your health care provider. To do this: ? ?  ? ?Put ice in a plastic bag. ?Place a towel between your skin and the bag. ?Leave the ice on for 20 minutes, 2-3 times a day. ?General instructions ?Pay attention to any changes in your symptoms. ?Try resting in an upright position instead of lying down. This may help to reduce pressure in your ear and relieve pain. ?Chew gum if it helps to relieve your ear pain. ?Treat any allergies as told by your health care provider. ?Drink enough fluid to keep your urine pale  yellow. ?It is up to you to get the results of any tests that were done. Ask your health care provider, or the department that is doing the tests, when your results will be ready. ?Keep all follow-up visits as told by your health care provider. This is important. ?Contact a health care provider if: ?Your pain does not improve within 2 days. ?Your earache gets worse. ?You have new symptoms. ?You have a fever. ?Get help right away if you: ?Have a severe headache. ?Have a stiff neck. ?Have trouble swallowing. ?Have redness or swelling behind your ear. ?Have fluid or blood coming from your ear. ?Have hearing loss. ?Feel dizzy. ?Summary ?An earache, or ear pain, can be caused by many things. ?Treatment of the earache will depend on the cause. Follow recommendations from your health care provider to treat your ear pain. ?If the cause is not clear or cannot be determined, you may need to watch your symptoms until your earache goes away or until a cause is found. ?Keep all follow-up visits as told by your health care provider. This is important. ?This information is not intended to replace advice given to you by your health care provider. Make sure you discuss any questions you have with your health care provider. ?Document Revised: 08/20/2018 Document Reviewed: 08/21/2018 ?Elsevier Patient Education ? [place]. ? ?Eustachian Tube Dysfunction ? ?Eustachian tube dysfunction refers to a condition in which a blockage develops in the  narrow passage that connects the middle ear to the back of the nose (eustachian tube). The eustachian tube regulates air pressure in the middle ear by letting air move between the ear and nose. It also helps to drain fluid from the middle ear space. ?Eustachian tube dysfunction can affect one or both ears. When the eustachian tube does not function properly, air pressure, fluid, or both can build up in the middle ear. ?What are the causes? ?This condition occurs when the eustachian tube  becomes blocked or cannot open normally. Common causes of this condition include: ?Ear infections. ?Colds and other infections that affect the nose, mouth, and throat (upper respiratory tract). ?Allergies. ?Irritation from cigarette smoke. ?Irritation from stomach acid coming up into the esophagus (gastroesophageal reflux). The esophagus is the part of the body that moves food from the mouth to the stomach. ?Sudden changes in air pressure, such as from descending in an airplane or scuba diving. ?Abnormal growths in the nose or throat, such as: ?Growths that line the nose (nasal polyps). ?Abnormal growth of cells (tumors). ?Enlarged tissue at the back of the throat (adenoids). ?What increases the risk? ?You are more likely to develop this condition if: ?You smoke. ?You are overweight. ?You are a child who has: ?Certain birth defects of the mouth, such as cleft palate. ?Large tonsils or adenoids. ?What are the signs or symptoms? ?Common symptoms of this condition include: ?A feeling of fullness in the ear. ?Ear pain. ?Clicking or popping noises in the ear. ?Ringing in the ear (tinnitus). ?Hearing loss. ?Loss of balance. ?Dizziness. ?Symptoms may get worse when the air pressure around you changes, such as when you travel to an area of high elevation, fly on an airplane, or go scuba diving. ?How is this diagnosed? ?This condition may be diagnosed based on: ?Your symptoms. ?A physical exam of your ears, nose, and throat. ?Tests, such as those that measure: ?The movement of your eardrum. ?Your hearing (audiometry). ?How is this treated? ?Treatment depends on the cause and severity of your condition. ?In mild cases, you may relieve your symptoms by moving air into your ears. This is called "popping the ears." ?In more severe cases, or if you have symptoms of fluid in your ears, treatment may include: ?Medicines to relieve congestion (decongestants). ?Medicines that treat allergies (antihistamines). ?Nasal sprays or ear  drops that contain medicines that reduce swelling (steroids). ?A procedure to drain the fluid in your eardrum. In this procedure, a small tube may be placed in the eardrum to: ?Drain the fluid. ?Restore the air in the middle ear space. ?A procedure to insert a balloon device through the nose to inflate the opening of the eustachian tube (balloon dilation). ?Follow these instructions at home: ?Lifestyle ?Do not do any of the following until your health care provider approves: ?Travel to high altitudes. ?Fly in airplanes. ?Work in a Pension scheme manager or room. ?Scuba dive. ?Do not use any products that contain nicotine or tobacco. These products include cigarettes, chewing tobacco, and vaping devices, such as e-cigarettes. If you need help quitting, ask your health care provider. ?Keep your ears dry. Wear fitted earplugs during showering and bathing. Dry your ears completely after. ?General instructions ?Take over-the-counter and prescription medicines only as told by your health care provider. ?Use techniques to help pop your ears as recommended by your health care provider. These may include: ?Chewing gum. ?Yawning. ?Frequent, forceful swallowing. ?Closing your mouth, holding your nose closed, and gently blowing as if you are trying to blow  air out of your nose. ?Keep all follow-up visits. This is important. ?Contact a health care provider if: ?Your symptoms do not go away after treatment. ?Your symptoms come back after treatment. ?You are unable to pop your ears. ?You have: ?A fever. ?Pain in your ear. ?Pain in your head or neck. ?Fluid draining from your ear. ?Your hearing suddenly changes. ?You become very dizzy. ?You lose your balance. ?Get help right away if: ?You have a sudden, severe increase in any of your symptoms. ?Summary ?Eustachian tube dysfunction refers to a condition in which a blockage develops in the eustachian tube. ?It can be caused by ear infections, allergies, inhaled irritants, or abnormal  growths in the nose or throat. ?Symptoms may include ear pain or fullness, hearing loss, or ringing in the ears. ?Mild cases are treated with techniques to unblock the ears, such as yawning or chewing gum. ?More se

## 2021-05-15 NOTE — Progress Notes (Signed)
Chief Complaint  ?Patient presents with  ? Otalgia  ?  Right sided ear ache that is intermittent since Sunday evening/Monday.  ? ?Acute ?1. Right ear pain since Sunday/Monday radiating to right posterior neck tried heat w/o relief. Denies fever, sinus pain negative for covid at home h/o allergies gets shots 2x per week with Dr. Donneta Romberg est with ENT Dr. Kathyrn Sheriff  ?Pain 3-4/10 ? ?Review of Systems  ?Constitutional:  Negative for weight loss.  ?HENT:  Positive for ear pain. Negative for hearing loss.   ?Eyes:  Negative for blurred vision.  ?Respiratory:  Negative for shortness of breath.   ?Cardiovascular:  Negative for chest pain.  ?Gastrointestinal:  Negative for abdominal pain and blood in stool.  ?Genitourinary:  Negative for dysuria.  ?Musculoskeletal:  Negative for falls and joint pain.  ?Skin:  Negative for rash.  ?Neurological:  Negative for headaches.  ?Psychiatric/Behavioral:  Negative for depression.   ?Past Medical History:  ?Diagnosis Date  ? Anemia   ? Anxiety   ? Breast pain, right 11/07/2012  ? Breathing difficulty 06/20/2014  ? Environmental allergies   ? GERD (gastroesophageal reflux disease)   ? Hiatal hernia   ? small  ? History of migraine headaches   ? Hyperthyroidism   ? s/p ablation  ? Vaginitis 01/16/2013  ? ?Past Surgical History:  ?Procedure Laterality Date  ? BREAST BIOPSY Right 12/05/2015  ? neg  ? CESAREAN SECTION  11/98  ? Dr Roena Malady  ? CHOLECYSTECTOMY  11/05  ? Dr Pat Patrick  ? JOINT REPLACEMENT Left 2017  ? hip  ? NASAL SINUS SURGERY  11/93  ? SEPTOPLASTY  5/90  ? Dr Rossie Muskrat  ? TUBAL LIGATION  02/1998  ? UMBILICAL HERNIA REPAIR  02/1998  ? ?Family History  ?Problem Relation Age of Onset  ? Heart disease Father   ? Hypertension Father   ? Thyroid disease Father   ? Hypercholesterolemia Father   ? Kidney cancer Father   ? Leukemia Father   ?     hairy cell  ? Hypercholesterolemia Mother   ? Thyroid disease Mother   ? Breast cancer Mother 43  ? Breast cancer Other   ?     maternal great grandmother   ? Heart disease Maternal Grandfather   ?     MI - age 75  ? Prostate cancer Paternal Grandfather   ? Pancreatic cancer Paternal Grandfather   ? Lung cancer Paternal Grandfather   ? Bladder Cancer Neg Hx   ? ?Social History  ? ?Socioeconomic History  ? Marital status: Married  ?  Spouse name: Not on file  ? Number of children: 1  ? Years of education: Not on file  ? Highest education level: Not on file  ?Occupational History  ? Not on file  ?Tobacco Use  ? Smoking status: Former  ?  Types: Cigarettes  ?  Quit date: 01/28/2000  ?  Years since quitting: 21.3  ? Smokeless tobacco: Never  ?Substance and Sexual Activity  ? Alcohol use: No  ?  Alcohol/week: 0.0 standard drinks  ? Drug use: No  ? Sexual activity: Not on file  ?Other Topics Concern  ? Not on file  ?Social History Narrative  ? Not on file  ? ?Social Determinants of Health  ? ?Financial Resource Strain: Not on file  ?Food Insecurity: Not on file  ?Transportation Needs: Not on file  ?Physical Activity: Not on file  ?Stress: Not on file  ?Social Connections: Not on file  ?  Intimate Partner Violence: Not on file  ? ?Current Meds  ?Medication Sig  ? acetaminophen (TYLENOL) 500 MG tablet Take 1,000 mg by mouth every 6 (six) hours as needed for moderate pain.  ? ALPRAZolam (XANAX) 0.25 MG tablet TAKE ONE TABLET DAILY IF NEEDED FOR ANXIETY  ? cholecalciferol (VITAMIN D3) 25 MCG (1000 UNIT) tablet Take 1,000 Units by mouth daily.  ? ciprofloxacin-dexamethasone (CIPRODEX) OTIC suspension Place 4 drops into the right ear 2 (two) times daily. X4-7 days  ? Cyanocobalamin (VITAMIN B-12) 1000 MCG/15ML LIQD Take 1,000 mcg by mouth every 30 (thirty) days.  ? EPINEPHrine 0.3 mg/0.3 mL IJ SOAJ injection   ? Estriol 10 % CREA by Does not apply route.  ? famotidine (PEPCID) 40 MG tablet Take by mouth.  ? omeprazole (PRILOSEC) 40 MG capsule Take 40 mg by mouth daily.  ? ondansetron (ZOFRAN ODT) 4 MG disintegrating tablet Take 1 tablet (4 mg total) by mouth 2 (two) times daily as  needed for nausea or vomiting.  ? SUMAtriptan (IMITREX) 100 MG tablet   ? ?Allergies  ?Allergen Reactions  ? Codeine Other (See Comments)  ?  Severe Headache  ? Decongest-Aid [Pseudoephedrine] Other (See Comments)  ?  Increased heart rate  ? Flagyl [Metronidazole]   ? Sulfa Antibiotics   ? ?Recent Results (from the past 2160 hour(s))  ?TSH     Status: None  ? Collection Time: 04/02/21  9:23 AM  ?Result Value Ref Range  ? TSH 1.65 0.35 - 5.50 uIU/mL  ?Comprehensive metabolic panel     Status: None  ? Collection Time: 04/02/21  9:23 AM  ?Result Value Ref Range  ? Sodium 141 135 - 145 mEq/L  ? Potassium 3.9 3.5 - 5.1 mEq/L  ? Chloride 105 96 - 112 mEq/L  ? CO2 30 19 - 32 mEq/L  ? Glucose, Bld 77 70 - 99 mg/dL  ? BUN 12 6 - 23 mg/dL  ? Creatinine, Ser 0.59 0.40 - 1.20 mg/dL  ? Total Bilirubin 0.4 0.2 - 1.2 mg/dL  ? Alkaline Phosphatase 66 39 - 117 U/L  ? AST 18 0 - 37 U/L  ? ALT 15 0 - 35 U/L  ? Total Protein 6.4 6.0 - 8.3 g/dL  ? Albumin 4.3 3.5 - 5.2 g/dL  ? GFR 99.95 >60.00 mL/min  ?  Comment: Calculated using the CKD-EPI Creatinine Equation (2021)  ? Calcium 9.6 8.4 - 10.5 mg/dL  ?CBC with Differential/Platelet     Status: None  ? Collection Time: 04/02/21  9:23 AM  ?Result Value Ref Range  ? WBC 5.6 4.0 - 10.5 K/uL  ? RBC 4.16 3.87 - 5.11 Mil/uL  ? Hemoglobin 12.9 12.0 - 15.0 g/dL  ? HCT 38.3 36.0 - 46.0 %  ? MCV 91.9 78.0 - 100.0 fl  ? MCHC 33.7 30.0 - 36.0 g/dL  ? RDW 13.4 11.5 - 15.5 %  ? Platelets 234.0 150.0 - 400.0 K/uL  ? Neutrophils Relative % 64.5 43.0 - 77.0 %  ? Lymphocytes Relative 26.2 12.0 - 46.0 %  ? Monocytes Relative 6.9 3.0 - 12.0 %  ? Eosinophils Relative 1.6 0.0 - 5.0 %  ? Basophils Relative 0.8 0.0 - 3.0 %  ? Neutro Abs 3.6 1.4 - 7.7 K/uL  ? Lymphs Abs 1.5 0.7 - 4.0 K/uL  ? Monocytes Absolute 0.4 0.1 - 1.0 K/uL  ? Eosinophils Absolute 0.1 0.0 - 0.7 K/uL  ? Basophils Absolute 0.0 0.0 - 0.1 K/uL  ?Lipid panel  Status: None  ? Collection Time: 04/02/21  9:23 AM  ?Result Value Ref Range  ?  Cholesterol 195 0 - 200 mg/dL  ?  Comment: ATP III Classification       Desirable:  < 200 mg/dL               Borderline High:  200 - 239 mg/dL          High:  > = 240 mg/dL  ? Triglycerides 110.0 0.0 - 149.0 mg/dL  ?  Comment: Normal:  <150 mg/dLBorderline High:  150 - 199 mg/dL  ? HDL 77.10 >39.00 mg/dL  ? VLDL 22.0 0.0 - 40.0 mg/dL  ? LDL Cholesterol 96 0 - 99 mg/dL  ? Total CHOL/HDL Ratio 3   ?  Comment:                Men          Women1/2 Average Risk     3.4          3.3Average Risk          5.0          4.42X Average Risk          9.6          7.13X Average Risk          15.0          11.0                      ? NonHDL 117.93   ?  Comment: NOTE:  Non-HDL goal should be 30 mg/dL higher than patient's LDL goal (i.e. LDL goal of < 70 mg/dL, would have non-HDL goal of < 100 mg/dL)  ?VITAMIN D 25 Hydroxy (Vit-D Deficiency, Fractures)     Status: Abnormal  ? Collection Time: 04/02/21  9:23 AM  ?Result Value Ref Range  ? VITD 21.22 (L) 30.00 - 100.00 ng/mL  ?Cytology - PAP( [place])     Status: None  ? Collection Time: 04/04/21 10:17 AM  ?Result Value Ref Range  ? High risk HPV Negative   ? Adequacy    ?  Satisfactory for evaluation; transformation zone component PRESENT.  ? Diagnosis    ?  - Negative for intraepithelial lesion or malignancy (NILM)  ? Comment Normal Reference Range HPV - Negative   ?Fecal occult blood, imunochemical     Status: None  ? Collection Time: 04/09/21  5:03 PM  ? Specimen: Stool  ?Result Value Ref Range  ? Fecal Occult Bld Negative Negative  ? ?Objective  ?Body mass index is 22.61 kg/m?. ?Wt Readings from Last 3 Encounters:  ?05/15/21 123 lb 9.6 oz (56.1 kg)  ?04/09/21 122 lb (55.3 kg)  ?04/04/21 122 lb 12.8 oz (55.7 kg)  ? ?Temp Readings from Last 3 Encounters:  ?05/15/21 98.5 ?F (36.9 ?C) (Oral)  ?04/09/21 98.1 ?F (36.7 ?C) (Temporal)  ?04/04/21 97.9 ?F (36.6 ?C)  ? ?BP Readings from Last 3 Encounters:  ?05/15/21 114/78  ?04/09/21 131/82  ?04/04/21 110/70  ? ?Pulse Readings from  Last 3 Encounters:  ?05/15/21 82  ?04/09/21 76  ?04/04/21 70  ? ? ?Physical Exam ?Vitals and nursing note reviewed.  ?Constitutional:   ?   Appearance: Normal appearance. She is well-developed and well-groomed.

## 2021-05-20 ENCOUNTER — Encounter: Payer: Self-pay | Admitting: Internal Medicine

## 2021-05-21 DIAGNOSIS — J3089 Other allergic rhinitis: Secondary | ICD-10-CM | POA: Diagnosis not present

## 2021-05-21 DIAGNOSIS — J301 Allergic rhinitis due to pollen: Secondary | ICD-10-CM | POA: Diagnosis not present

## 2021-05-27 ENCOUNTER — Ambulatory Visit: Payer: BC Managed Care – PPO | Admitting: Internal Medicine

## 2021-05-28 ENCOUNTER — Ambulatory Visit: Payer: BC Managed Care – PPO | Admitting: Internal Medicine

## 2021-05-28 ENCOUNTER — Encounter: Payer: Self-pay | Admitting: Internal Medicine

## 2021-05-28 DIAGNOSIS — J309 Allergic rhinitis, unspecified: Secondary | ICD-10-CM | POA: Diagnosis not present

## 2021-05-28 DIAGNOSIS — D649 Anemia, unspecified: Secondary | ICD-10-CM

## 2021-05-28 DIAGNOSIS — J3089 Other allergic rhinitis: Secondary | ICD-10-CM | POA: Diagnosis not present

## 2021-05-28 DIAGNOSIS — K219 Gastro-esophageal reflux disease without esophagitis: Secondary | ICD-10-CM | POA: Diagnosis not present

## 2021-05-28 DIAGNOSIS — E0590 Thyrotoxicosis, unspecified without thyrotoxic crisis or storm: Secondary | ICD-10-CM | POA: Diagnosis not present

## 2021-05-28 DIAGNOSIS — F419 Anxiety disorder, unspecified: Secondary | ICD-10-CM

## 2021-05-28 DIAGNOSIS — J301 Allergic rhinitis due to pollen: Secondary | ICD-10-CM | POA: Diagnosis not present

## 2021-05-28 NOTE — Progress Notes (Signed)
Patient ID: Shannon [name], female   DOB: 12/24/1965, 56 y.o.   MRN: [id number] ? ? ?Subjective:  ? ? Patient ID: Shannon [name], female    DOB: [birth year], 56 y.o.   MRN: [id number] ? ?This visit occurred during the SARS-CoV-2 public health emergency.  Safety protocols were in place, including screening questions prior to the visit, additional usage of staff PPE, and extensive cleaning of exam room while observing appropriate contact time as indicated for disinfecting solutions.  ? ?Patient here for work in appt.  ?.  ? ?HPI ?Work in - concerns regarding possible fluid in her ear.  Evaluated by Dr Kelly Services - right ear pain.  Prescribed ciprodex otic suspension. Also discussed flonase and saline rinses.  Is better.  Still some ear fullness.  Sees an allergist.  No chest pain or sob reported.  No abdominal pain or bowel change reported.  Handling stress.   ? ? ?Past Medical History:  ?Diagnosis Date  ? Anemia   ? Anxiety   ? Breast pain, right 11/07/2012  ? Breathing difficulty 06/20/2014  ? Environmental allergies   ? GERD (gastroesophageal reflux disease)   ? Hiatal hernia   ? small  ? History of migraine headaches   ? Hyperthyroidism   ? s/p ablation  ? Vaginitis 01/16/2013  ? ?Past Surgical History:  ?Procedure Laterality Date  ? BREAST BIOPSY Right 12/05/2015  ? neg  ? CESAREAN SECTION  11/98  ? Dr Roena Malady  ? CHOLECYSTECTOMY  11/05  ? Dr Pat Patrick  ? JOINT REPLACEMENT Left 2017  ? hip  ? NASAL SINUS SURGERY  11/93  ? SEPTOPLASTY  5/90  ? Dr Rossie Muskrat  ? TUBAL LIGATION  02/1998  ? UMBILICAL HERNIA REPAIR  02/1998  ? ?Family History  ?Problem Relation Age of Onset  ? Heart disease Father   ? Hypertension Father   ? Thyroid disease Father   ? Hypercholesterolemia Father   ? Kidney cancer Father   ? Leukemia Father   ?     hairy cell  ? Hypercholesterolemia Mother   ? Thyroid disease Mother   ? Breast cancer Mother 69  ? Breast cancer Other   ?     maternal great grandmother  ? Heart disease Maternal Grandfather   ?     MI - age 36   ? Prostate cancer Paternal Grandfather   ? Pancreatic cancer Paternal Grandfather   ? Lung cancer Paternal Grandfather   ? Bladder Cancer Neg Hx   ? ?Social History  ? ?Socioeconomic History  ? Marital status: Married  ?  Spouse name: Not on file  ? Number of children: 1  ? Years of education: Not on file  ? Highest education level: Not on file  ?Occupational History  ? Not on file  ?Tobacco Use  ? Smoking status: Former  ?  Types: Cigarettes  ?  Quit date: 01/28/2000  ?  Years since quitting: 21.3  ? Smokeless tobacco: Never  ?Substance and Sexual Activity  ? Alcohol use: No  ?  Alcohol/week: 0.0 standard drinks  ? Drug use: No  ? Sexual activity: Not on file  ?Other Topics Concern  ? Not on file  ?Social History Narrative  ? Not on file  ? ?Social Determinants of Health  ? ?Financial Resource Strain: Not on file  ?Food Insecurity: Not on file  ?Transportation Needs: Not on file  ?Physical Activity: Not on file  ?Stress: Not on file  ?Social Connections: Not on  file  ? ? ? ?Review of Systems  ?Constitutional:  Negative for appetite change and unexpected weight change.  ?HENT:  Negative for congestion and sinus pressure.   ?     Ear fullness as outlined.   ?Respiratory:  Negative for cough, chest tightness and shortness of breath.   ?Cardiovascular:  Negative for chest pain, palpitations and leg swelling.  ?Gastrointestinal:  Negative for abdominal pain, diarrhea, nausea and vomiting.  ?Genitourinary:  Negative for difficulty urinating and dysuria.  ?Musculoskeletal:  Negative for joint swelling and myalgias.  ?Skin:  Negative for color change and rash.  ?Neurological:  Negative for dizziness, light-headedness and headaches.  ?Psychiatric/Behavioral:  Negative for agitation and dysphoric mood.   ? ?   ?Objective:  ?  ? ?BP 126/70 (BP Location: Left Arm, Patient Position: Sitting, Cuff Size: Small)   Pulse 84   Temp 98.3 ?F (36.8 ?C) (Temporal)   Resp 15   Ht '5\' 2"'$  (1.575 m)   Wt 122 lb (55.3 kg)   LMP  02/12/2013   SpO2 99%   BMI 22.31 kg/m?  ?Wt Readings from Last 3 Encounters:  ?05/28/21 122 lb (55.3 kg)  ?05/15/21 123 lb 9.6 oz (56.1 kg)  ?04/09/21 122 lb (55.3 kg)  ? ? ?Physical Exam ?Vitals reviewed.  ?Constitutional:   ?   General: She is not in acute distress. ?   Appearance: Normal appearance.  ?HENT:  ?   Head: Normocephalic and atraumatic.  ?   Right Ear: Tympanic membrane, ear canal and external ear normal.  ?   Left Ear: Tympanic membrane, ear canal and external ear normal.  ?Eyes:  ?   General: No scleral icterus.    ?   Right eye: No discharge.     ?   Left eye: No discharge.  ?   Conjunctiva/sclera: Conjunctivae normal.  ?Neck:  ?   Thyroid: No thyromegaly.  ?Cardiovascular:  ?   Rate and Rhythm: Normal rate and regular rhythm.  ?Pulmonary:  ?   Effort: No respiratory distress.  ?   Breath sounds: Normal breath sounds. No wheezing.  ?Abdominal:  ?   General: Bowel sounds are normal.  ?   Palpations: Abdomen is soft.  ?   Tenderness: There is no abdominal tenderness.  ?Musculoskeletal:     ?   General: No swelling or tenderness.  ?   Cervical back: Neck supple. No tenderness.  ?Lymphadenopathy:  ?   Cervical: No cervical adenopathy.  ?Skin: ?   Findings: No erythema or rash.  ?Neurological:  ?   Mental Status: She is alert.  ?Psychiatric:     ?   Mood and Affect: Mood normal.     ?   Behavior: Behavior normal.  ? ? ? ?Outpatient Encounter Medications as of 05/28/2021  ?Medication Sig  ? acetaminophen (TYLENOL) 500 MG tablet Take 1,000 mg by mouth every 6 (six) hours as needed for moderate pain.  ? ALPRAZolam (XANAX) 0.25 MG tablet TAKE ONE TABLET DAILY IF NEEDED FOR ANXIETY  ? cholecalciferol (VITAMIN D3) 25 MCG (1000 UNIT) tablet Take 1,000 Units by mouth daily.  ? ciprofloxacin-dexamethasone (CIPRODEX) OTIC suspension Place 4 drops into the right ear 2 (two) times daily. X4-7 days  ? Cyanocobalamin (VITAMIN B-12) 1000 MCG/15ML LIQD Take 1,000 mcg by mouth every 30 (thirty) days.  ? EPINEPHrine 0.3  mg/0.3 mL IJ SOAJ injection   ? Estriol 10 % CREA by Does not apply route.  ? famotidine (PEPCID) 40 MG tablet Take  by mouth.  ? omeprazole (PRILOSEC) 40 MG capsule Take 40 mg by mouth daily.  ? ondansetron (ZOFRAN ODT) 4 MG disintegrating tablet Take 1 tablet (4 mg total) by mouth 2 (two) times daily as needed for nausea or vomiting.  ? SUMAtriptan (IMITREX) 100 MG tablet   ? ?No facility-administered encounter medications on file as of 05/28/2021.  ?  ? ?Lab Results  ?Component Value Date  ? WBC 5.6 04/02/2021  ? HGB 12.9 04/02/2021  ? HCT 38.3 04/02/2021  ? PLT 234.0 04/02/2021  ? GLUCOSE 77 04/02/2021  ? CHOL 195 04/02/2021  ? TRIG 110.0 04/02/2021  ? HDL 77.10 04/02/2021  ? [place] 96 04/02/2021  ? ALT 15 04/02/2021  ? AST 18 04/02/2021  ? NA 141 04/02/2021  ? K 3.9 04/02/2021  ? CL 105 04/02/2021  ? CREATININE 0.59 04/02/2021  ? BUN 12 04/02/2021  ? CO2 30 04/02/2021  ? TSH 1.65 04/02/2021  ? ? ?[place] [facility] 1-60 MIN NO REPORT ? ?Result Date: 04/09/2021 ?Fluoro was used, but no Radiologist interpretation will be provided. Please refer to "NOTES" tab for provider progress note. ? ? ?   ?Assessment & Plan:  ? ?Problem List Items Addressed This Visit   ? ? Allergic rhinitis  ?  Sees an allergist.  Ears are some better.  Afrin nasal and steroid nasal spray as directed.   ? ?  ?  ? Anemia  ?  Follow cbc.  Colonoscopy 11/2014 -diverticulosis.  ? ?  ?  ? Anxiety  ?  Discussed.  Will notify me if feels needs any further intervention.  Follow.  ? ?  ?  ? Gastroesophageal reflux disease without esophagitis  ?  Stable.  On prilosec.  ? ?  ?  ? Hyperthyroidism  ?  S/p ablation.  Follow tsh.  ? ?  ?  ? ? ? ?[name], MD  ?

## 2021-05-28 NOTE — Patient Instructions (Addendum)
Afrin nasal spray - 2 sprays each nostril two times per day ? ?Nasacort nasal spray - 2 sprays each nostril one time per day.  Do this in the evening.  ?

## 2021-06-03 DIAGNOSIS — D2261 Melanocytic nevi of right upper limb, including shoulder: Secondary | ICD-10-CM | POA: Diagnosis not present

## 2021-06-03 DIAGNOSIS — L821 Other seborrheic keratosis: Secondary | ICD-10-CM | POA: Diagnosis not present

## 2021-06-03 DIAGNOSIS — Z85828 Personal history of other malignant neoplasm of skin: Secondary | ICD-10-CM | POA: Diagnosis not present

## 2021-06-03 DIAGNOSIS — D225 Melanocytic nevi of trunk: Secondary | ICD-10-CM | POA: Diagnosis not present

## 2021-06-04 DIAGNOSIS — J301 Allergic rhinitis due to pollen: Secondary | ICD-10-CM | POA: Diagnosis not present

## 2021-06-04 DIAGNOSIS — J3089 Other allergic rhinitis: Secondary | ICD-10-CM | POA: Diagnosis not present

## 2021-06-06 ENCOUNTER — Ambulatory Visit (INDEPENDENT_AMBULATORY_CARE_PROVIDER_SITE_OTHER): Payer: BC Managed Care – PPO | Admitting: *Deleted

## 2021-06-06 DIAGNOSIS — E538 Deficiency of other specified B group vitamins: Secondary | ICD-10-CM

## 2021-06-06 MED ORDER — CYANOCOBALAMIN 1000 MCG/ML IJ SOLN
1000.0000 ug | Freq: Once | INTRAMUSCULAR | Status: AC
  Administered 2021-06-06: 1000 ug via INTRAMUSCULAR

## 2021-06-06 NOTE — Progress Notes (Signed)
Patient presented for B 12 injection to left deltoid, patient voiced no concerns nor showed any signs of distress during injection. 

## 2021-06-08 ENCOUNTER — Encounter: Payer: Self-pay | Admitting: Internal Medicine

## 2021-06-08 NOTE — Assessment & Plan Note (Signed)
S/p ablation.  Follow tsh.  ?

## 2021-06-08 NOTE — Assessment & Plan Note (Signed)
Follow cbc.  Colonoscopy 11/2014 -diverticulosis.  

## 2021-06-08 NOTE — Assessment & Plan Note (Signed)
Discussed.  Will notify me if feels needs any further intervention.  Follow.  ?

## 2021-06-08 NOTE — Assessment & Plan Note (Signed)
Stable.  On prilosec.  

## 2021-06-08 NOTE — Assessment & Plan Note (Signed)
Sees an allergist.  Ears are some better.  Afrin nasal and steroid nasal spray as directed.   ?

## 2021-06-11 DIAGNOSIS — J301 Allergic rhinitis due to pollen: Secondary | ICD-10-CM | POA: Diagnosis not present

## 2021-06-11 DIAGNOSIS — J3089 Other allergic rhinitis: Secondary | ICD-10-CM | POA: Diagnosis not present

## 2021-06-18 DIAGNOSIS — J301 Allergic rhinitis due to pollen: Secondary | ICD-10-CM | POA: Diagnosis not present

## 2021-06-18 DIAGNOSIS — J3089 Other allergic rhinitis: Secondary | ICD-10-CM | POA: Diagnosis not present

## 2021-06-25 DIAGNOSIS — J3089 Other allergic rhinitis: Secondary | ICD-10-CM | POA: Diagnosis not present

## 2021-06-25 DIAGNOSIS — J301 Allergic rhinitis due to pollen: Secondary | ICD-10-CM | POA: Diagnosis not present

## 2021-07-01 NOTE — Progress Notes (Unsigned)
Patient: Shannon [name]  Service Category: E/M  Provider: Gaspar Cola, MD  DOB: 06-Jan-1966  DOS: 07/02/2021  Location: Office  MRN: [id number]  Setting: Ambulatory outpatient  Referring Provider: [name], MD  Type: Established Patient  Specialty: Interventional Pain Management  PCP: [name], MD  Location: Remote location  Delivery: TeleHealth     Virtual Encounter - Pain Management PROVIDER NOTE: Information contained herein reflects review and annotations entered in association with encounter. Interpretation of such information and data should be left to medically-trained personnel. Information provided to patient can be located elsewhere in the medical record under "Patient Instructions". Document created using STT-dictation technology, any transcriptional errors that may result from process are unintentional.    Contact & Pharmacy Preferred: [phone] Home: [phone] (home) Mobile: [phone] (mobile) E-mail: [email or web address]  [place], [place] - [address] [place] [address] Phone: [phone] Fax: [phone]   Pre-screening  Shannon [name] offered "in-person" vs "virtual" encounter. She indicated preferring virtual for this encounter.   Reason COVID-19*  Social distancing based on CDC and AMA recommendations.   I contacted Shannon [name] on 07/02/2021 via telephone.      I clearly identified myself as Gaspar Cola, MD. I verified that I was speaking with the correct person using two identifiers (Name: Shannon [name], and date of birth: [birth year]).  Consent I sought verbal advanced consent from Shannon [name] for virtual visit interactions. I informed Shannon [name] of possible security and privacy concerns, risks, and limitations associated with providing "not-in-person" medical evaluation and management services. I also informed Shannon [name] of the availability of "in-person" appointments. Finally, I informed her  that there would be a charge for the virtual visit and that she could be  personally, fully or partially, financially responsible for it. Shannon [name] expressed understanding and agreed to proceed.   Historic Elements   Shannon [name] is a 56 y.o. year old, female patient evaluated today after our last contact on 04/25/2021. Shannon [name]  has a past medical history of Anemia, Anxiety, Breast pain, right (11/07/2012), Breathing difficulty (06/20/2014), Environmental allergies, GERD (gastroesophageal reflux disease), Hiatal hernia, History of migraine headaches, Hyperthyroidism, and Vaginitis (01/16/2013). She also  has a past surgical history that includes Septoplasty (5/90); Nasal sinus surgery (11/93); Cesarean section (11/98); Tubal ligation (02/1998); Umbilical hernia repair (02/1998); Cholecystectomy (11/05); Joint replacement (Left, 2017); and Breast biopsy (Right, 12/05/2015). Shannon [name] has a current medication list which includes the following prescription(s): acetaminophen, alprazolam, cholecalciferol, ciprofloxacin-dexamethasone, vitamin b-12, epinephrine, estriol, famotidine, omeprazole, ondansetron, and sumatriptan. She  reports that she quit smoking about 21 years ago. Her smoking use included cigarettes. She has never used smokeless tobacco. She reports that she does not drink alcohol and does not use drugs. Shannon [name] is allergic to codeine, decongest-aid [pseudoephedrine], flagyl [metronidazole], and sulfa antibiotics.   HPI  Today, she is being contacted for worsening of previously known (established) problem.  The patient calls indicating having a flareup of her right-sided sacroiliac joint pain.  She has a longstanding history of problems with her SI joint and she already knows how to perform the [facility] maneuver.  She indicated that doing so reproduced her pain in the lower back, right above her butt cheek.  She indicates that it is referring pain through the back of the right thigh, but it does  not reach the knee.  She denies any radicular pain, numbness, or weakness.  She also refers  that it does not feel like the pain that she had when she was having problems with the hip joint.  She seems to be able to tell clearly between the hip joint pain and the sacroiliac joint pain.  Pharmacotherapy Assessment   Opioid Analgesic: None from our practice.   Monitoring: [place] PMP: PDMP reviewed during this encounter.       Pharmacotherapy: No side-effects or adverse reactions reported. Compliance: No problems identified. Effectiveness: Clinically acceptable. Plan: Refer to "POC". UDS: No results found for: SUMMARY   Laboratory Chemistry Profile   Renal Lab Results  Component Value Date   BUN 12 04/02/2021   CREATININE 0.59 04/02/2021   GFR 99.95 04/02/2021   GFRAA >60 05/02/2014   GFRNONAA >60 05/02/2014    Hepatic Lab Results  Component Value Date   AST 18 04/02/2021   ALT 15 04/02/2021   ALBUMIN 4.3 04/02/2021   ALKPHOS 66 04/02/2021    Electrolytes Lab Results  Component Value Date   NA 141 04/02/2021   K 3.9 04/02/2021   CL 105 04/02/2021   CALCIUM 9.6 04/02/2021    Bone Lab Results  Component Value Date   VD25OH 21.22 (L) 04/02/2021   25OHVITD1 28 (L) 10/05/2017   25OHVITD2 <1.0 10/05/2017   25OHVITD3 28 10/05/2017    Inflammation (CRP: Acute Phase) (ESR: Chronic Phase) Lab Results  Component Value Date   CRP 2 10/05/2017   ESRSEDRATE 7 10/05/2017         Note: Above Lab results reviewed.  Imaging  DG PAIN CLINIC C-ARM 1-60 MIN NO REPORT Fluoro was used, but no Radiologist interpretation will be provided.  Please refer to "NOTES" tab for provider progress note.  Assessment  The primary encounter diagnosis was Chronic low back pain (1ry area of Pain) (Bilateral) (R>L). Diagnoses of Chronic sacroiliac joint pain (Right), Osteoarthritis of sacroiliac joint (Bilateral) (R>L), Sacroiliac joint dysfunction (Right), Somatic dysfunction of sacroiliac joint  (Right), and Other specified dorsopathies, sacral and sacrococcygeal region were also pertinent to this visit.  Plan of Care  Problem-specific:  No problem-specific Assessment & Plan notes found for this encounter.  Ms. LATASH NOURI has a current medication list which includes the following long-term medication(s): famotidine and sumatriptan.  Pharmacotherapy (Medications Ordered): No orders of the defined types were placed in this encounter.  Orders:  Orders Placed This Encounter  Procedures   SACROILIAC JOINT INJECTION    Standing Status:   Future    Standing Expiration Date:   10/02/2021    Scheduling Instructions:     Side: Right-sided     Sedation: No Sedation.     Timeframe: ASAP    Order Specific Question:   Where will this procedure be performed?    Answer:   [facility]   Follow-up plan:   Return for (Clinic) procedure: (R) SI Blk.     Interventional Therapies  Risk  Complexity Considerations:   Estimated body mass index is 22.31 kg/m as calculated from the following:   Height as of this encounter: '5\' 2"'  (1.575 m).   Weight as of this encounter: 122 lb (55.3 kg). WNL   Planned  Pending:   Therapeutic/palliative right SI joint Blk #5    Under consideration:   Possible right lumbar facet RFA #1  Possible right SI joint RFA #1  Possible left SI joint RFA #1  Therapeutic left cervical facet RFA #1    Completed:   Palliative right gluteal MNB  Palliative right SI joint Blk  x4 (09/28/2018) (100/100/100/90-100)  Palliative left SI joint Blk x2 (09/28/2018) (100/100/100/90-100)  Palliative right L4-5 LESI x1 (12/10/2015) (100/100/100/100)  Palliative left L4-5 LESI x1 (01/11/2015) (100/100/95/95)  Therapeutic right trochanteric bursa inj. x1 (04/09/2021) (100/100/100/100)  Therapeutic right IA hip inj. x1 (04/09/2021) (100/100/100/100)  Palliative left IA hip injection x2 (06/14/2015) (100/100/80/80-90)  Palliative right lumbar facet MBB x3 (07/15/2018)  (100/100/90/100)  Diagnostic left lumbar facet MBB x1 (04/13/2018) (100/100/100/100)  Diagnostic right great toe IA inj. x1  Palliative right IA small joint inj. 1st MTP (Dorsal Metatarsophalangeal ) x1 (10/15/2017) (75/75/75/50-75)  Diagnostic left cervical facet MBB x1 (09/13/2019) (100/100/95/>75)    Therapeutic  Palliative (PRN) options:   Palliative right gluteal MNB Palliative right SI joint block #5  Palliative left SI joint block #3  Palliative right L4-5 LESI #2  Palliative left L4-5 LESI #2  Palliative right lumbar facet block #4  Diagnostic left lumbar facet block #2  Diagnostic right great toe IA injection #2  Palliative right IA small joint injection 1st MTP (Dorsal Metatarsophalangeal ) #2  Diagnostic left cervical facet block #2     Recent Visits Date Type Provider Dept  04/25/21 Office Visit Milinda Pointer, MD [facility]  04/09/21 Procedure visit Milinda Pointer, MD [facility]  04/03/21 Office Visit Milinda Pointer, MD [facility]  Showing recent visits within past 90 days and meeting all other requirements Today's Visits Date Type Provider Dept  07/02/21 Office Visit Milinda Pointer, MD [facility]  Showing today's visits and meeting all other requirements Future Appointments No visits were found meeting these conditions. Showing future appointments within next 90 days and meeting all other requirements  I discussed the assessment and treatment plan with the patient. The patient was provided an opportunity to ask questions and all were answered. The patient agreed with the plan and demonstrated an understanding of the instructions.  Patient advised to call back or seek an in-person evaluation if the symptoms or condition worsens.  Duration of encounter: 12 minutes.  Note by: Gaspar Cola, MD Date: 07/02/2021; Time: 1:44 PM

## 2021-07-02 ENCOUNTER — Ambulatory Visit: Payer: BC Managed Care – PPO | Attending: Pain Medicine | Admitting: Pain Medicine

## 2021-07-02 DIAGNOSIS — M5388 Other specified dorsopathies, sacral and sacrococcygeal region: Secondary | ICD-10-CM

## 2021-07-02 DIAGNOSIS — J3089 Other allergic rhinitis: Secondary | ICD-10-CM | POA: Diagnosis not present

## 2021-07-02 DIAGNOSIS — M545 Low back pain, unspecified: Secondary | ICD-10-CM | POA: Diagnosis not present

## 2021-07-02 DIAGNOSIS — M461 Sacroiliitis, not elsewhere classified: Secondary | ICD-10-CM | POA: Diagnosis not present

## 2021-07-02 DIAGNOSIS — M9904 Segmental and somatic dysfunction of sacral region: Secondary | ICD-10-CM

## 2021-07-02 DIAGNOSIS — G8929 Other chronic pain: Secondary | ICD-10-CM

## 2021-07-02 DIAGNOSIS — J301 Allergic rhinitis due to pollen: Secondary | ICD-10-CM | POA: Diagnosis not present

## 2021-07-02 DIAGNOSIS — M533 Sacrococcygeal disorders, not elsewhere classified: Secondary | ICD-10-CM | POA: Diagnosis not present

## 2021-07-02 NOTE — Patient Instructions (Addendum)
______________________________________________________________________  Preparing for Procedure with Sedation  NOTICE: Due to recent regulatory changes, starting on August 27, 2020, procedures requiring intravenous (IV) sedation will no longer be performed at the [facility].  These types of procedures are required to be performed at [facility] ambulatory surgery facility.  We are very sorry for the inconvenience.  Procedure appointments are limited to planned procedures: No Prescription Refills. No disability issues will be discussed. No medication changes will be discussed.  Instructions: Oral Intake: Do not eat or drink anything for at least 8 hours prior to your procedure. (Exception: Blood Pressure Medication. See below.) Transportation: A driver is required. You may not drive yourself after the procedure. Blood Pressure Medicine: Do not forget to take your blood pressure medicine with a sip of water the morning of the procedure. If your Diastolic (lower reading) is above 100 mmHg, elective cases will be cancelled/rescheduled. Blood thinners: These will need to be stopped for procedures. Notify our staff if you are taking any blood thinners. Depending on which one you take, there will be specific instructions on how and when to stop it. Diabetics on insulin: Notify the staff so that you can be scheduled 1st case in the morning. If your diabetes requires high dose insulin, take only  of your normal insulin dose the morning of the procedure and notify the staff that you have done so. Preventing infections: Shower with an antibacterial soap the morning of your procedure. Build-up your immune system: Take 1000 mg of Vitamin C with every meal (3 times a day) the day prior to your procedure. Antibiotics: Inform the staff if you have a condition or reason that requires you to take antibiotics before dental procedures. Pregnancy: If you are pregnant, call and cancel the procedure. Sickness: If  you have a cold, fever, or any active infections, call and cancel the procedure. Arrival: You must be in the facility at least 30 minutes prior to your scheduled procedure. Children: Do not bring children with you. Dress appropriately: There is always the possibility that your clothing may get soiled. Valuables: Do not bring any jewelry or valuables.  Reasons to call and reschedule or cancel your procedure: (Following these recommendations will minimize the risk of a serious complication.) Surgeries: Avoid having procedures within 2 weeks of any surgery. (Avoid for 2 weeks before or after any surgery). Flu Shots: Avoid having procedures within 2 weeks of a flu shots. (Avoid for 2 weeks before or after immunizations). Barium: Avoid having a procedure within 7-10 days after having had a radiological study involving the use of radiological contrast. (Myelograms, Barium swallow or enema study). Heart attacks: Avoid any elective procedures or surgeries for the initial 6 months after a "Myocardial Infarction" (Heart Attack). Blood thinners: It is imperative that you stop these medications before procedures. Let us know if you if you take any blood thinner.  Infection: Avoid procedures during or within two weeks of an infection (including chest colds or gastrointestinal problems). Symptoms associated with infections include: Localized redness, fever, chills, night sweats or profuse sweating, burning sensation when voiding, cough, congestion, stuffiness, runny nose, sore throat, diarrhea, nausea, vomiting, cold or Flu symptoms, recent or current infections. It is specially important if the infection is over the area that we intend to treat. Heart and lung problems: Symptoms that may suggest an active cardiopulmonary problem include: cough, chest pain, breathing difficulties or shortness of breath, dizziness, ankle swelling, uncontrolled high or unusually low blood pressure, and/or palpitations. If you are    experiencing any of these symptoms, cancel your procedure and contact your primary care physician for an evaluation.  Remember:  Regular Business hours are:  Monday to Thursday 8:00 AM to 4:00 PM  Provider's Schedule: Zen Cedillos, MD:  Procedure days: Tuesday and Thursday 7:30 AM to 4:00 PM  Bilal Lateef, MD:  Procedure days: Monday and Wednesday 7:30 AM to 4:00 PM ______________________________________________________________________  ____________________________________________________________________________________________  General Risks and Possible Complications  Patient Responsibilities: It is important that you read this as it is part of your informed consent. It is our duty to inform you of the risks and possible complications associated with treatments offered to you. It is your responsibility as a patient to read this and to ask questions about anything that is not clear or that you believe was not covered in this document.  Patient's Rights: You have the right to refuse treatment. You also have the right to change your mind, even after initially having agreed to have the treatment done. However, under this last option, if you wait until the last second to change your mind, you may be charged for the materials used up to that point.  Introduction: Medicine is not an exact science. Everything in Medicine, including the lack of treatment(s), carries the potential for danger, harm, or loss (which is by definition: Risk). In Medicine, a complication is a secondary problem, condition, or disease that can aggravate an already existing one. All treatments carry the risk of possible complications. The fact that a side effects or complications occurs, does not imply that the treatment was conducted incorrectly. It must be clearly understood that these can happen even when everything is done following the highest safety standards.  No treatment: You can choose not to proceed with the  proposed treatment alternative. The "PRO(s)" would include: avoiding the risk of complications associated with the therapy. The "CON(s)" would include: not getting any of the treatment benefits. These benefits fall under one of three categories: diagnostic; therapeutic; and/or palliative. Diagnostic benefits include: getting information which can ultimately lead to improvement of the disease or symptom(s). Therapeutic benefits are those associated with the successful treatment of the disease. Finally, palliative benefits are those related to the decrease of the primary symptoms, without necessarily curing the condition (example: decreasing the pain from a flare-up of a chronic condition, such as incurable terminal cancer).  General Risks and Complications: These are associated to most interventional treatments. They can occur alone, or in combination. They fall under one of the following six (6) categories: no benefit or worsening of symptoms; bleeding; infection; nerve damage; allergic reactions; and/or death. No benefits or worsening of symptoms: In Medicine there are no guarantees, only probabilities. No healthcare provider can ever guarantee that a medical treatment will work, they can only state the probability that it may. Furthermore, there is always the possibility that the condition may worsen, either directly, or indirectly, as a consequence of the treatment. Bleeding: This is more common if the patient is taking a blood thinner, either prescription or over the counter (example: Goody Powders, Fish oil, Aspirin, Garlic, etc.), or if suffering a condition associated with impaired coagulation (example: Hemophilia, cirrhosis of the liver, low platelet counts, etc.). However, even if you do not have one on these, it can still happen. If you have any of these conditions, or take one of these drugs, make sure to notify your treating physician. Infection: This is more common in patients with a compromised  immune system, either due to disease (example:   diabetes, cancer, human immunodeficiency virus [HIV], etc.), or due to medications or treatments (example: therapies used to treat cancer and rheumatological diseases). However, even if you do not have one on these, it can still happen. If you have any of these conditions, or take one of these drugs, make sure to notify your treating physician. Nerve Damage: This is more common when the treatment is an invasive one, but it can also happen with the use of medications, such as those used in the treatment of cancer. The damage can occur to small secondary nerves, or to large primary ones, such as those in the spinal cord and brain. This damage may be temporary or permanent and it may lead to impairments that can range from temporary numbness to permanent paralysis and/or brain death. Allergic Reactions: Any time a substance or material comes in contact with our body, there is the possibility of an allergic reaction. These can range from a mild skin rash (contact dermatitis) to a severe systemic reaction (anaphylactic reaction), which can result in death. Death: In general, any medical intervention can result in death, most of the time due to an unforeseen complication. ____________________________________________________________________________________________ ______________________________________________________________________  Preparing for your procedure (without sedation)  Procedure appointments are limited to planned procedures: No Prescription Refills. No disability issues will be discussed. No medication changes will be discussed.  Instructions: Food Intake: Avoid eating anything for at least 4 hours prior to your procedure. Transportation: Unless otherwise stated by your physician, bring a driver. Morning Medicines: Take all of your scheduled morning medications. If you take heart medicine, except for blood thinners, do not forget to take it the  morning of the procedure. If your Diastolic (lower reading) is above 100 mmHg, elective cases will be cancelled/rescheduled. Blood thinners: These will need to be stopped for procedures. Notify our staff if you are taking any blood thinners. Depending on which one you take, there will be specific instructions on how and when to stop it. Diabetics on insulin: Notify the staff so that you can be scheduled 1st case in the morning. If your diabetes requires high dose insulin, take only  of your normal insulin dose the morning of the procedure and notify the staff that you have done so. Preventing infections: Shower with an antibacterial soap the morning of your procedure.  Build-up your immune system: Take 1000 mg of Vitamin C with every meal (3 times a day) the day prior to your procedure. Antibiotics: Inform the staff if you have a condition or reason that requires you to take antibiotics before dental procedures. Pregnancy: If you are pregnant, call and cancel the procedure. Sickness: If you have a cold, fever, or any active infections, call and cancel the procedure. Arrival: You must be in the facility at least 30 minutes prior to your scheduled procedure. Children: Do not bring any children with you. Dress appropriately: There is always a possibility that your clothing may get soiled. Valuables: Do not bring any jewelry or valuables.  Reasons to call and reschedule or cancel your procedure: (Following these recommendations will minimize the risk of a serious complication.) Surgeries: Avoid having procedures within 2 weeks of any surgery. (Avoid for 2 weeks before or after any surgery). Flu Shots: Avoid having procedures within 2 weeks of a flu shots or . (Avoid for 2 weeks before or after immunizations). Barium: Avoid having a procedure within 7-10 days after having had a radiological study involving the use of radiological contrast. (Myelograms, Barium swallow or enema study). Heart  attacks:  Avoid any elective procedures or surgeries for the initial 6 months after a "Myocardial Infarction" (Heart Attack). Blood thinners: It is imperative that you stop these medications before procedures. Let us know if you if you take any blood thinner.  Infection: Avoid procedures during or within two weeks of an infection (including chest colds or gastrointestinal problems). Symptoms associated with infections include: Localized redness, fever, chills, night sweats or profuse sweating, burning sensation when voiding, cough, congestion, stuffiness, runny nose, sore throat, diarrhea, nausea, vomiting, cold or Flu symptoms, recent or current infections. It is specially important if the infection is over the area that we intend to treat. Heart and lung problems: Symptoms that may suggest an active cardiopulmonary problem include: cough, chest pain, breathing difficulties or shortness of breath, dizziness, ankle swelling, uncontrolled high or unusually low blood pressure, and/or palpitations. If you are experiencing any of these symptoms, cancel your procedure and contact your primary care physician for an evaluation.  Remember:  Regular Business hours are:  Monday to Thursday 8:00 AM to 4:00 PM  Provider's Schedule: Milinda Pointer, MD:  Procedure days: Tuesday and Thursday 7:30 AM to 4:00 PM  Gillis Santa, MD:  Procedure days: Monday and Wednesday 7:30 AM to 4:00 PM ______________________________________________________________________  ____________________________________________________________________________________________  General Risks and Possible Complications  Patient Responsibilities: It is important that you read this as it is part of your informed consent. It is our duty to inform you of the risks and possible complications associated with treatments offered to you. It is your responsibility as a patient to read this and to ask questions about anything that is not clear or that you  believe was not covered in this document.  Patient's Rights: You have the right to refuse treatment. You also have the right to change your mind, even after initially having agreed to have the treatment done. However, under this last option, if you wait until the last second to change your mind, you may be charged for the materials used up to that point.  Introduction: Medicine is not an Chief Strategy Officer. Everything in Medicine, including the lack of treatment(s), carries the potential for danger, harm, or loss (which is by definition: Risk). In Medicine, a complication is a secondary problem, condition, or disease that can aggravate an already existing one. All treatments carry the risk of possible complications. The fact that a side effects or complications occurs, does not imply that the treatment was conducted incorrectly. It must be clearly understood that these can happen even when everything is done following the highest safety standards.  No treatment: You can choose not to proceed with the proposed treatment alternative. The "PRO(s)" would include: avoiding the risk of complications associated with the therapy. The "CON(s)" would include: not getting any of the treatment benefits. These benefits fall under one of three categories: diagnostic; therapeutic; and/or palliative. Diagnostic benefits include: getting information which can ultimately lead to improvement of the disease or symptom(s). Therapeutic benefits are those associated with the successful treatment of the disease. Finally, palliative benefits are those related to the decrease of the primary symptoms, without necessarily curing the condition (example: decreasing the pain from a flare-up of a chronic condition, such as incurable terminal cancer).  General Risks and Complications: These are associated to most interventional treatments. They can occur alone, or in combination. They fall under one of the following six (6) categories: no  benefit or worsening of symptoms; bleeding; infection; nerve damage; allergic reactions; and/or death. No benefits or worsening  of symptoms: In Medicine there are no guarantees, only probabilities. No healthcare provider can ever guarantee that a medical treatment will work, they can only state the probability that it may. Furthermore, there is always the possibility that the condition may worsen, either directly, or indirectly, as a consequence of the treatment. Bleeding: This is more common if the patient is taking a blood thinner, either prescription or over the counter (example: Goody Powders, Fish oil, Aspirin, Garlic, etc.), or if suffering a condition associated with impaired coagulation (example: Hemophilia, cirrhosis of the liver, low platelet counts, etc.). However, even if you do not have one on these, it can still happen. If you have any of these conditions, or take one of these drugs, make sure to notify your treating physician. Infection: This is more common in patients with a compromised immune system, either due to disease (example: diabetes, cancer, human immunodeficiency virus [HIV], etc.), or due to medications or treatments (example: therapies used to treat cancer and rheumatological diseases). However, even if you do not have one on these, it can still happen. If you have any of these conditions, or take one of these drugs, make sure to notify your treating physician. Nerve Damage: This is more common when the treatment is an invasive one, but it can also happen with the use of medications, such as those used in the treatment of cancer. The damage can occur to small secondary nerves, or to large primary ones, such as those in the spinal cord and brain. This damage may be temporary or permanent and it may lead to impairments that can range from temporary numbness to permanent paralysis and/or brain death. Allergic Reactions: Any time a substance or material comes in contact with our body,  there is the possibility of an allergic reaction. These can range from a mild skin rash (contact dermatitis) to a severe systemic reaction (anaphylactic reaction), which can result in death. Death: In general, any medical intervention can result in death, most of the time due to an unforeseen complication. ____________________________________________________________________________________________

## 2021-07-04 ENCOUNTER — Ambulatory Visit: Payer: BC Managed Care – PPO | Attending: Pain Medicine | Admitting: Pain Medicine

## 2021-07-04 ENCOUNTER — Encounter: Payer: Self-pay | Admitting: Pain Medicine

## 2021-07-04 ENCOUNTER — Ambulatory Visit
Admission: RE | Admit: 2021-07-04 | Discharge: 2021-07-04 | Disposition: A | Discharge status: Home / Self Care | Payer: BC Managed Care – PPO | Source: Ambulatory Visit | Attending: Pain Medicine | Admitting: Pain Medicine

## 2021-07-04 VITALS — BP 134/98 | HR 70 | Temp 97.4°F | Resp 20 | Ht 62.0 in | Wt 124.0 lb

## 2021-07-04 DIAGNOSIS — M533 Sacrococcygeal disorders, not elsewhere classified: Secondary | ICD-10-CM | POA: Diagnosis not present

## 2021-07-04 DIAGNOSIS — M545 Low back pain, unspecified: Secondary | ICD-10-CM | POA: Insufficient documentation

## 2021-07-04 DIAGNOSIS — M25551 Pain in right hip: Secondary | ICD-10-CM | POA: Insufficient documentation

## 2021-07-04 DIAGNOSIS — G8929 Other chronic pain: Secondary | ICD-10-CM | POA: Insufficient documentation

## 2021-07-04 DIAGNOSIS — M461 Sacroiliitis, not elsewhere classified: Secondary | ICD-10-CM | POA: Insufficient documentation

## 2021-07-04 DIAGNOSIS — M5388 Other specified dorsopathies, sacral and sacrococcygeal region: Secondary | ICD-10-CM

## 2021-07-04 DIAGNOSIS — M9904 Segmental and somatic dysfunction of sacral region: Secondary | ICD-10-CM

## 2021-07-04 MED ORDER — ROPIVACAINE HCL 2 MG/ML IJ SOLN
4.0000 mL | Freq: Once | INTRAMUSCULAR | Status: AC
  Administered 2021-07-04: 4 mL via INTRA_ARTICULAR
  Filled 2021-07-04: qty 20

## 2021-07-04 MED ORDER — LIDOCAINE HCL 2 % IJ SOLN
20.0000 mL | Freq: Once | INTRAMUSCULAR | Status: AC
  Administered 2021-07-04: 400 mg
  Filled 2021-07-04: qty 20

## 2021-07-04 MED ORDER — METHYLPREDNISOLONE ACETATE 80 MG/ML IJ SUSP
80.0000 mg | Freq: Once | INTRAMUSCULAR | Status: AC
  Administered 2021-07-04: 80 mg via INTRA_ARTICULAR
  Filled 2021-07-04: qty 1

## 2021-07-04 MED ORDER — PENTAFLUOROPROP-TETRAFLUOROETH EX AERO
INHALATION_SPRAY | Freq: Once | CUTANEOUS | Status: DC
  Filled 2021-07-04: qty 116

## 2021-07-04 NOTE — Progress Notes (Signed)
PROVIDER NOTE: Interpretation of information contained herein should be left to medically-trained personnel. Specific patient instructions are provided elsewhere under "Patient Instructions" section of medical record. This document was created in part using STT-dictation technology, any transcriptional errors that may result from this process are unintentional.  Patient: Shannon [name] Type: Established DOB: 1965-11-20 MRN: [id number] PCP: [name], MD  Service: Procedure DOS: 07/04/2021 Setting: Ambulatory Location: Ambulatory outpatient facility Delivery: Face-to-face Provider: Gaspar Cola, MD Specialty: Interventional Pain Management Specialty designation: 09 Location: Outpatient facility Ref. Prov.: Milinda Pointer, MD    Primary Reason for Visit: Interventional Pain Management Treatment. CC: Hip Pain (right)    Procedure:          Anesthesia, Analgesia, Anxiolysis:  Type: Therapeutic Sacroiliac Joint Steroid Injection  #5   Region: Inferior Lumbosacral Region Level: PIIS (Posterior Inferior Iliac Spine) Laterality: Right  Anesthesia: Local (1-2% Lidocaine)  Anxiolysis: None  Sedation: None  Guidance: Fluoroscopy           Position: Prone           1. Chronic sacroiliac joint pain (Right)   2. Osteoarthritis of sacroiliac joint (Bilateral) (R>L)   3. Other specified dorsopathies, sacral and sacrococcygeal region   4. Sacroiliac joint dysfunction (Right)   5. Somatic dysfunction of sacroiliac joint (Right)   6. Chronic low back pain (1ry area of Pain) (Bilateral) (R>L)    NAS-11 Pain score:   Pre-procedure: 4 /10   Post-procedure: 0-No pain/10     Pre-op H&P Assessment:  Shannon [name] is a 56 y.o. (year old), female patient, seen today for interventional treatment. She  has a past surgical history that includes Septoplasty (5/90); Nasal sinus surgery (11/93); Cesarean section (11/98); Tubal ligation (02/1998); Umbilical hernia repair (02/1998); Cholecystectomy  (11/05); Joint replacement (Left, 2017); and Breast biopsy (Right, 12/05/2015). Shannon [name] has a current medication list which includes the following prescription(s): acetaminophen, alprazolam, cholecalciferol, ciprofloxacin-dexamethasone, vitamin b-12, epinephrine, estriol, famotidine, omeprazole, ondansetron, and sumatriptan, and the following Facility-Administered Medications: pentafluoroprop-tetrafluoroeth. Her primarily concern today is the Hip Pain (right)  Initial Vital Signs:  Pulse/HCG Rate: 70ECG Heart Rate: 67 Temp: (!) 97.4 F (36.3 C) Resp: 16 BP: 129/77 SpO2: 100 %  BMI: Estimated body mass index is 22.68 kg/m as calculated from the following:   Height as of this encounter: '5\' 2"'$  (1.575 m).   Weight as of this encounter: 124 lb (56.2 kg).  Risk Assessment: Allergies: Reviewed. She is allergic to codeine, decongest-aid [pseudoephedrine], flagyl [metronidazole], and sulfa antibiotics.  Allergy Precautions: None required Coagulopathies: Reviewed. None identified.  Blood-thinner therapy: None at this time Active Infection(s): Reviewed. None identified. Shannon [name] is afebrile  Site Confirmation: Shannon [name] was asked to confirm the procedure and laterality before marking the site Procedure checklist: Completed Consent: Before the procedure and under the influence of no sedative(s), amnesic(s), or anxiolytics, the patient was informed of the treatment options, risks and possible complications. To fulfill our ethical and legal obligations, as recommended by the American Medical Association's Code of Ethics, I have informed the patient of my clinical impression; the nature and purpose of the treatment or procedure; the risks, benefits, and possible complications of the intervention; the alternatives, including doing nothing; the risk(s) and benefit(s) of the alternative treatment(s) or procedure(s); and the risk(s) and benefit(s) of doing nothing. The patient was provided information about  the general risks and possible complications associated with the procedure. These may include, but are not limited to: failure to achieve desired goals, infection,  bleeding, organ or nerve damage, allergic reactions, paralysis, and death. In addition, the patient was informed of those risks and complications associated to the procedure, such as failure to decrease pain; infection; bleeding; organ or nerve damage with subsequent damage to sensory, motor, and/or autonomic systems, resulting in permanent pain, numbness, and/or weakness of one or several areas of the body; allergic reactions; (i.e.: anaphylactic reaction); and/or death. Furthermore, the patient was informed of those risks and complications associated with the medications. These include, but are not limited to: allergic reactions (i.e.: anaphylactic or anaphylactoid reaction(s)); adrenal axis suppression; blood sugar elevation that in diabetics may result in ketoacidosis or comma; water retention that in patients with history of congestive heart failure may result in shortness of breath, pulmonary edema, and decompensation with resultant heart failure; weight gain; swelling or edema; medication-induced neural toxicity; particulate matter embolism and blood vessel occlusion with resultant organ, and/or nervous system infarction; and/or aseptic necrosis of one or more joints. Finally, the patient was informed that Medicine is not an exact science; therefore, there is also the possibility of unforeseen or unpredictable risks and/or possible complications that may result in a catastrophic outcome. The patient indicated having understood very clearly. We have given the patient no guarantees and we have made no promises. Enough time was given to the patient to ask questions, all of which were answered to the patient's satisfaction. [name] has indicated that she wanted to continue with the procedure. Attestation: I, the ordering provider, attest that I  have discussed with the patient the benefits, risks, side-effects, alternatives, likelihood of achieving goals, and potential problems during recovery for the procedure that I have provided informed consent. Date  Time: 07/04/2021  8:33 AM  Pre-Procedure Preparation:  Monitoring: As per clinic protocol. Respiration, ETCO2, SpO2, BP, heart rate and rhythm monitor placed and checked for adequate function Safety Precautions: Patient was assessed for positional comfort and pressure points before starting the procedure. Time-out: I initiated and conducted the "Time-out" before starting the procedure, as per protocol. The patient was asked to participate by confirming the accuracy of the "Time Out" information. Verification of the correct person, site, and procedure were performed and confirmed by me, the nursing staff, and the patient. "Time-out" conducted as per Joint Commission's Universal Protocol (UP.01.01.01). Time: 0910  Description of Procedure:          Target Area: Inferior, posterior, aspect of the sacroiliac fissure Approach: Posterior, paraspinal, ipsilateral approach. Area Prepped: Entire Lower Lumbosacral Region DuraPrep (Iodine Povacrylex [0.7% available iodine] and Isopropyl Alcohol, 74% w/w) Safety Precautions: Aspiration looking for blood return was conducted prior to all injections. At no point did we inject any substances, as a needle was being advanced. No attempts were made at seeking any paresthesias. Safe injection practices and needle disposal techniques used. Medications properly checked for expiration dates. SDV (single dose vial) medications used. Description of the Procedure: Protocol guidelines were followed. The patient was placed in position over the procedure table. The target area was identified and the area prepped in the usual manner. Skin & deeper tissues infiltrated with local anesthetic. Appropriate amount of time allowed to pass for local anesthetics to take effect.  The procedure needle was advanced under fluoroscopic guidance into the sacroiliac joint until a firm endpoint was obtained. Proper needle placement secured. Negative aspiration confirmed. Solution injected in intermittent fashion, asking for systemic symptoms every 0.5cc of injectate. The needles were then removed and the area cleansed, making sure to leave some of the prepping  solution back to take advantage of its long term bactericidal properties. Vitals:   07/04/21 0832 07/04/21 0909 07/04/21 0912 07/04/21 0917  BP: 129/77 133/84 138/83 (!) 134/98  Pulse: 70     Resp: '16 13 14 20  '$ Temp: (!) 97.4 F (36.3 C)     SpO2: 100% 99% 99% 100%  Weight: 124 lb (56.2 kg)     Height: '5\' 2"'$  (1.575 m)       Start Time: 0910 hrs. End Time: 0915 hrs. Materials:  Needle(s) Type: Spinal Needle Gauge: 22G Length: 5.0-in Medication(s): Please see orders for medications and dosing details.  Imaging Guidance (Non-Spinal):          Type of Imaging Technique: Fluoroscopy Guidance (Non-Spinal) Indication(s): Assistance in needle guidance and placement for procedures requiring needle placement in or near specific anatomical locations not easily accessible without such assistance. Exposure Time: Please see nurses notes. Contrast: Before injecting any contrast, we confirmed that the patient did not have an allergy to iodine, shellfish, or radiological contrast. Once satisfactory needle placement was completed at the desired level, radiological contrast was injected. Contrast injected under live fluoroscopy. No contrast complications. See chart for type and volume of contrast used. Fluoroscopic Guidance: I was personally present during the use of fluoroscopy. "Tunnel Vision Technique" used to obtain the best possible view of the target area. Parallax error corrected before commencing the procedure. "Direction-depth-direction" technique used to introduce the needle under continuous pulsed fluoroscopy. Once target was  reached, antero-posterior, oblique, and lateral fluoroscopic projection used confirm needle placement in all planes. Images permanently stored in EMR. Interpretation: I personally interpreted the imaging intraoperatively. Adequate needle placement confirmed in multiple planes. Appropriate spread of contrast into desired area was observed. No evidence of afferent or efferent intravascular uptake. Permanent images saved into the patient's record.  Antibiotic Prophylaxis:   Anti-infectives (From admission, onward)    None      Indication(s): None identified  Post-operative Assessment:  Post-procedure Vital Signs:  Pulse/HCG Rate: 7072 Temp: (!) 97.4 F (36.3 C) Resp: 20 BP: (!) 134/98 SpO2: 100 %  EBL: None  Complications: No immediate post-treatment complications observed by team, or reported by patient.  Note: The patient tolerated the entire procedure well. A repeat set of vitals were taken after the procedure and the patient was kept under observation following institutional policy, for this type of procedure. Post-procedural neurological assessment was performed, showing return to baseline, prior to discharge. The patient was provided with post-procedure discharge instructions, including a section on how to identify potential problems. Should any problems arise concerning this procedure, the patient was given instructions to immediately contact us, at any time, without hesitation. In any case, we plan to contact the patient by telephone for a follow-up status report regarding this interventional procedure.  Comments:  No additional relevant information.  Plan of Care  Orders:  Orders Placed This Encounter  Procedures   SACROILIAC JOINT INJECTION    Scheduling Instructions:     Side: Right-sided     Sedation: No Sedation.     Timeframe: Today    Order Specific Question:   Where will this procedure be performed?    Answer:   [facility]   [facility] C-ARM 1-60  MIN NO REPORT    Intraoperative interpretation by procedural physician at [place].    Standing Status:   Standing    Number of Occurrences:   1    Order Specific Question:   Reason for exam:  Answer:   Assistance in needle guidance and placement for procedures requiring needle placement in or near specific anatomical locations not easily accessible without such assistance.   Informed Consent Details: Physician/Practitioner Attestation; Transcribe to consent form and obtain patient signature    Nursing Order: Transcribe to consent form and obtain patient signature. Note: Always confirm laterality of pain with Ms. Durene Fruits, before procedure.    Order Specific Question:   Physician/Practitioner attestation of informed consent for procedure/surgical case    Answer:   I, the physician/practitioner, attest that I have discussed with the patient the benefits, risks, side effects, alternatives, likelihood of achieving goals and potential problems during recovery for the procedure that I have provided informed consent.    Order Specific Question:   Procedure    Answer:   Sacroiliac Joint Block    Order Specific Question:   Physician/Practitioner performing the procedure    Answer:   Driana Dazey A. Dossie Arbour, MD    Order Specific Question:   Indication/Reason    Answer:   Chronic Low Back and Hip Pain secondary to Sacroiliac Joint Pain (Arthralgia/Arthropathy)   Provide equipment / supplies at bedside    "Block Tray" (Disposable  single use) Needle type: SpinalSpinal Amount/quantity: 1 Size: Medium (5-inch) Gauge: 22G    Standing Status:   Standing    Number of Occurrences:   1    Order Specific Question:   Specify    Answer:   Block Tray   Chronic Opioid Analgesic:  None from our practice.   Medications ordered for procedure: Meds ordered this encounter  Medications   lidocaine (XYLOCAINE) 2 % (with pres) injection 400 mg   pentafluoroprop-tetrafluoroeth (GEBAUERS) aerosol    methylPREDNISolone acetate (DEPO-MEDROL) injection 80 mg   ropivacaine (PF) 2 mg/mL (0.2%) (NAROPIN) injection 4 mL   Medications administered: We administered lidocaine, methylPREDNISolone acetate, and ropivacaine (PF) 2 mg/mL (0.2%).  See the medical record for exact dosing, route, and time of administration.  Follow-up plan:   Return in about 2 weeks (around 07/18/2021) for Proc-day (T,Th), (VV), (PPE).       Interventional Therapies  Risk  Complexity Considerations:   Estimated body mass index is 22.31 kg/m as calculated from the following:   Height as of this encounter: '5\' 2"'$  (1.575 m).   Weight as of this encounter: 122 lb (55.3 kg). WNL   Planned  Pending:   Therapeutic/palliative right SI joint Blk #5    Under consideration:   Possible right lumbar facet RFA #1  Possible right SI joint RFA #1  Possible left SI joint RFA #1  Therapeutic left cervical facet RFA #1    Completed:   Palliative right gluteal MNB  Palliative right SI joint Blk x4 (09/28/2018) (100/100/100/90-100)  Palliative left SI joint Blk x2 (09/28/2018) (100/100/100/90-100)  Palliative right L4-5 LESI x1 (12/10/2015) (100/100/100/100)  Palliative left L4-5 LESI x1 (01/11/2015) (100/100/95/95)  Therapeutic right trochanteric bursa inj. x1 (04/09/2021) (100/100/100/100)  Therapeutic right IA hip inj. x1 (04/09/2021) (100/100/100/100)  Palliative left IA hip injection x2 (06/14/2015) (100/100/80/80-90)  Palliative right lumbar facet MBB x3 (07/15/2018) (100/100/90/100)  Diagnostic left lumbar facet MBB x1 (04/13/2018) (100/100/100/100)  Diagnostic right great toe IA inj. x1  Palliative right IA small joint inj. 1st MTP (Dorsal Metatarsophalangeal ) x1 (10/15/2017) (75/75/75/50-75)  Diagnostic left cervical facet MBB x1 (09/13/2019) (100/100/95/>75)    Therapeutic  Palliative (PRN) options:   Palliative right gluteal MNB Palliative right SI joint block #5  Palliative left SI joint block #3  Palliative  right  L4-5 LESI #2  Palliative left L4-5 LESI #2  Palliative right lumbar facet block #4  Diagnostic left lumbar facet block #2  Diagnostic right great toe IA injection #2  Palliative right IA small joint injection 1st MTP (Dorsal Metatarsophalangeal ) #2  Diagnostic left cervical facet block #2      Recent Visits Date Type Provider Dept  07/02/21 Office Visit Milinda Pointer, MD [facility]  04/25/21 Office Visit Milinda Pointer, MD [facility]  04/09/21 Procedure visit Milinda Pointer, MD [facility]  Showing recent visits within past 90 days and meeting all other requirements Today's Visits Date Type Provider Dept  07/04/21 Procedure visit Milinda Pointer, MD [facility]  Showing today's visits and meeting all other requirements Future Appointments Date Type Provider Dept  07/23/21 Appointment Milinda Pointer, MD [facility]  Showing future appointments within next 90 days and meeting all other requirements  Disposition: Discharge home  Discharge (Date  Time): 07/04/2021; 0920 hrs.   Primary Care Physician: [name], MD Location: [facility] Note by: Gaspar Cola, MD Date: 07/04/2021; Time: 9:50 AM  Disclaimer:  Medicine is not an Chief Strategy Officer. The only guarantee in medicine is that nothing is guaranteed. It is important to note that the decision to proceed with this intervention was based on the information collected from the patient. The Data and conclusions were drawn from the patient's questionnaire, the interview, and the physical examination. Because the information was provided in large part by the patient, it cannot be guaranteed that it has not been purposely or unconsciously manipulated. Every effort has been made to obtain as much relevant data as possible for this evaluation. It is important to note that the conclusions that lead to this procedure are derived in large  part from the available data. Always take into account that the treatment will also be dependent on availability of resources and existing treatment guidelines, considered by other Pain Management Practitioners as being common knowledge and practice, at the time of the intervention. For Medico-Legal purposes, it is also important to point out that variation in procedural techniques and pharmacological choices are the acceptable norm. The indications, contraindications, technique, and results of the above procedure should only be interpreted and judged by a Board-Certified Interventional Pain Specialist with extensive familiarity and expertise in the same exact procedure and technique.

## 2021-07-04 NOTE — Patient Instructions (Signed)

## 2021-07-05 ENCOUNTER — Telehealth: Payer: Self-pay | Admitting: *Deleted

## 2021-07-05 NOTE — Telephone Encounter (Signed)
Post procedure call;  patient reports she is doing well.  No questions or concerns.  

## 2021-07-08 ENCOUNTER — Ambulatory Visit: Payer: BC Managed Care – PPO

## 2021-07-08 DIAGNOSIS — H6061 Unspecified chronic otitis externa, right ear: Secondary | ICD-10-CM | POA: Diagnosis not present

## 2021-07-08 DIAGNOSIS — H903 Sensorineural hearing loss, bilateral: Secondary | ICD-10-CM | POA: Diagnosis not present

## 2021-07-08 DIAGNOSIS — H93293 Other abnormal auditory perceptions, bilateral: Secondary | ICD-10-CM | POA: Diagnosis not present

## 2021-07-09 ENCOUNTER — Ambulatory Visit (INDEPENDENT_AMBULATORY_CARE_PROVIDER_SITE_OTHER): Payer: BC Managed Care – PPO

## 2021-07-09 DIAGNOSIS — E538 Deficiency of other specified B group vitamins: Secondary | ICD-10-CM

## 2021-07-09 DIAGNOSIS — J301 Allergic rhinitis due to pollen: Secondary | ICD-10-CM | POA: Diagnosis not present

## 2021-07-09 DIAGNOSIS — J3089 Other allergic rhinitis: Secondary | ICD-10-CM | POA: Diagnosis not present

## 2021-07-09 MED ORDER — CYANOCOBALAMIN 1000 MCG/ML IJ SOLN
1000.0000 ug | Freq: Once | INTRAMUSCULAR | Status: AC
  Administered 2021-07-09: 1000 ug via INTRAMUSCULAR

## 2021-07-09 NOTE — Progress Notes (Signed)
Patient presented for B 12 injection to right deltoid, patient voiced no concerns nor showed any signs of distress during injection. 

## 2021-07-16 DIAGNOSIS — J301 Allergic rhinitis due to pollen: Secondary | ICD-10-CM | POA: Diagnosis not present

## 2021-07-16 DIAGNOSIS — J3089 Other allergic rhinitis: Secondary | ICD-10-CM | POA: Diagnosis not present

## 2021-07-22 ENCOUNTER — Encounter: Payer: Self-pay | Admitting: Pain Medicine

## 2021-07-23 ENCOUNTER — Ambulatory Visit: Payer: BC Managed Care – PPO | Attending: Pain Medicine | Admitting: Pain Medicine

## 2021-07-23 DIAGNOSIS — M25551 Pain in right hip: Secondary | ICD-10-CM | POA: Diagnosis not present

## 2021-07-23 DIAGNOSIS — M533 Sacrococcygeal disorders, not elsewhere classified: Secondary | ICD-10-CM | POA: Diagnosis not present

## 2021-07-23 DIAGNOSIS — J3089 Other allergic rhinitis: Secondary | ICD-10-CM | POA: Diagnosis not present

## 2021-07-23 DIAGNOSIS — M545 Low back pain, unspecified: Secondary | ICD-10-CM

## 2021-07-23 DIAGNOSIS — J301 Allergic rhinitis due to pollen: Secondary | ICD-10-CM | POA: Diagnosis not present

## 2021-07-23 DIAGNOSIS — G8929 Other chronic pain: Secondary | ICD-10-CM

## 2021-07-23 DIAGNOSIS — J3081 Allergic rhinitis due to animal (cat) (dog) hair and dander: Secondary | ICD-10-CM | POA: Diagnosis not present

## 2021-08-01 DIAGNOSIS — J301 Allergic rhinitis due to pollen: Secondary | ICD-10-CM | POA: Diagnosis not present

## 2021-08-01 DIAGNOSIS — J3089 Other allergic rhinitis: Secondary | ICD-10-CM | POA: Diagnosis not present

## 2021-08-08 ENCOUNTER — Ambulatory Visit (INDEPENDENT_AMBULATORY_CARE_PROVIDER_SITE_OTHER): Payer: BC Managed Care – PPO

## 2021-08-08 ENCOUNTER — Ambulatory Visit: Payer: BC Managed Care – PPO

## 2021-08-08 DIAGNOSIS — E538 Deficiency of other specified B group vitamins: Secondary | ICD-10-CM

## 2021-08-08 DIAGNOSIS — R052 Subacute cough: Secondary | ICD-10-CM | POA: Diagnosis not present

## 2021-08-08 DIAGNOSIS — J3089 Other allergic rhinitis: Secondary | ICD-10-CM | POA: Diagnosis not present

## 2021-08-08 DIAGNOSIS — R21 Rash and other nonspecific skin eruption: Secondary | ICD-10-CM | POA: Diagnosis not present

## 2021-08-08 DIAGNOSIS — H1045 Other chronic allergic conjunctivitis: Secondary | ICD-10-CM | POA: Diagnosis not present

## 2021-08-08 DIAGNOSIS — J301 Allergic rhinitis due to pollen: Secondary | ICD-10-CM | POA: Diagnosis not present

## 2021-08-08 MED ORDER — CYANOCOBALAMIN 1000 MCG/ML IJ SOLN
1000.0000 ug | Freq: Once | INTRAMUSCULAR | Status: AC
  Administered 2021-08-08: 1000 ug via INTRAMUSCULAR

## 2021-08-08 NOTE — Progress Notes (Signed)
Shannon [name] presents today for injection per MD orders. B12 injection  administered SQ in left Upper Arm. Administration without incident. Patient tolerated well.  Asencion Islam

## 2021-08-13 DIAGNOSIS — J301 Allergic rhinitis due to pollen: Secondary | ICD-10-CM | POA: Diagnosis not present

## 2021-08-13 DIAGNOSIS — J3089 Other allergic rhinitis: Secondary | ICD-10-CM | POA: Diagnosis not present

## 2021-08-15 ENCOUNTER — Encounter: Payer: Self-pay | Admitting: Internal Medicine

## 2021-08-15 ENCOUNTER — Ambulatory Visit: Payer: BC Managed Care – PPO | Admitting: Internal Medicine

## 2021-08-15 DIAGNOSIS — K219 Gastro-esophageal reflux disease without esophagitis: Secondary | ICD-10-CM | POA: Diagnosis not present

## 2021-08-15 DIAGNOSIS — H938X9 Other specified disorders of ear, unspecified ear: Secondary | ICD-10-CM | POA: Diagnosis not present

## 2021-08-15 DIAGNOSIS — M26622 Arthralgia of left temporomandibular joint: Secondary | ICD-10-CM | POA: Diagnosis not present

## 2021-08-15 MED ORDER — PREDNISONE 10 MG PO TABS: ORAL_TABLET | ORAL | 0 refills | Status: DC

## 2021-08-15 NOTE — Progress Notes (Signed)
Patient ID: Shannon [name], female   DOB: March 30, 1965, 56 y.o.   MRN: [id number]   Subjective:    Patient ID: Shannon [name], female    DOB: [birth year], 56 y.o.   MRN: [id number]   Patient here for work in appt.   Chief Complaint  Patient presents with   Ear Pain    R ear pain   .   HPI Work in with concerns regarding persistent ear issues.  Initially evaluated 05/15/21 - right ear pain. Prescribed ciprodex suspension.  Reevaluated 5/2 - was some better, but had persistent ear fullness.  Discussed flonase and saline nasal spray.  Added afrin nasal spray.  Comes in today - persistent issues.  States when rolls over in bed - dizzy.  Feels like something crawling in her ear.  Some neck soreness - right side down.  Wearing mouthguard. Evaluated by her dentist.  No problem found.  Some persistent light headedness when turns head quick or bends over.  Not as bad today.     Past Medical History:  Diagnosis Date   Anemia    Anxiety    Breast pain, right 11/07/2012   Breathing difficulty 06/20/2014   Environmental allergies    GERD (gastroesophageal reflux disease)    Hiatal hernia    small   History of migraine headaches    Hyperthyroidism    s/p ablation   Vaginitis 01/16/2013   Past Surgical History:  Procedure Laterality Date   BREAST BIOPSY Right 12/05/2015   neg   CESAREAN SECTION  11/98   Dr Roena Malady   CHOLECYSTECTOMY  11/05   Dr Pat Patrick   JOINT REPLACEMENT Left 2017   hip   NASAL SINUS SURGERY  11/93   SEPTOPLASTY  5/90   Dr Rossie Muskrat   TUBAL LIGATION  03/8180   UMBILICAL HERNIA REPAIR  02/1998   Family History  Problem Relation Age of Onset   Heart disease Father    Hypertension Father    Thyroid disease Father    Hypercholesterolemia Father    Kidney cancer Father    Leukemia Father        hairy cell   Hypercholesterolemia Mother    Thyroid disease Mother    Breast cancer Mother 17   Breast cancer Other        maternal great grandmother   Heart disease Maternal  Grandfather        MI - age 72   Prostate cancer Paternal Grandfather    Pancreatic cancer Paternal Grandfather    Lung cancer Paternal Grandfather    Bladder Cancer Neg Hx    Social History   Socioeconomic History   Marital status: Married    Spouse name: Not on file   Number of children: 1   Years of education: Not on file   Highest education level: Not on file  Occupational History   Not on file  Tobacco Use   Smoking status: Former    Types: Cigarettes    Quit date: 01/28/2000    Years since quitting: 21.5   Smokeless tobacco: Never  Substance and Sexual Activity   Alcohol use: No    Alcohol/week: 0.0 standard drinks of alcohol   Drug use: No   Sexual activity: Not on file  Other Topics Concern   Not on file  Social History Narrative   Not on file   Social Determinants of Health   Financial Resource Strain: Not on file  Food Insecurity: Not on file  Transportation  Needs: Not on file  Physical Activity: Not on file  Stress: Not on file  Social Connections: Not on file     Review of Systems  Constitutional:  Negative for appetite change, fever and unexpected weight change.  HENT:  Negative for congestion.        Ear fullness.  Discomfort right side.   Respiratory:  Negative for cough, chest tightness and shortness of breath.   Cardiovascular:  Negative for chest pain, palpitations and leg swelling.  Gastrointestinal:  Negative for abdominal pain, diarrhea, nausea and vomiting.  Genitourinary:  Negative for difficulty urinating and dysuria.  Musculoskeletal:  Negative for joint swelling and myalgias.  Skin:  Negative for color change and rash.  Neurological:  Positive for dizziness and light-headedness. Negative for headaches.  Psychiatric/Behavioral:  Negative for agitation and dysphoric mood.        Objective:     BP 124/70 (BP Location: Left Arm, Patient Position: Sitting, Cuff Size: Small)   Pulse 74   Temp 98.5 F (36.9 C) (Temporal)   Resp 14    Ht '5\' 2"'$  (1.575 m)   Wt 128 lb 3.2 oz (58.2 kg)   LMP 02/12/2013   SpO2 98%   BMI 23.45 kg/m  Wt Readings from Last 3 Encounters:  08/15/21 128 lb 3.2 oz (58.2 kg)  07/04/21 124 lb (56.2 kg)  05/28/21 122 lb (55.3 kg)    Physical Exam Vitals reviewed.  Constitutional:      General: She is not in acute distress.    Appearance: Normal appearance.  HENT:     Head: Normocephalic and atraumatic.     Right Ear: Tympanic membrane, ear canal and external ear normal.     Left Ear: Tympanic membrane, ear canal and external ear normal.     Mouth/Throat:     Pharynx: No oropharyngeal exudate or posterior oropharyngeal erythema.  Eyes:     General: No scleral icterus.       Right eye: No discharge.        Left eye: No discharge.     Conjunctiva/sclera: Conjunctivae normal.  Neck:     Thyroid: No thyromegaly.  Cardiovascular:     Rate and Rhythm: Normal rate and regular rhythm.  Pulmonary:     Effort: No respiratory distress.     Breath sounds: Normal breath sounds. No wheezing.  Abdominal:     General: Bowel sounds are normal.     Palpations: Abdomen is soft.     Tenderness: There is no abdominal tenderness.  Musculoskeletal:        General: No swelling or tenderness.     Cervical back: Neck supple. No tenderness.  Lymphadenopathy:     Cervical: No cervical adenopathy.  Skin:    Findings: No erythema or rash.  Neurological:     Mental Status: She is alert.  Psychiatric:        Mood and Affect: Mood normal.        Behavior: Behavior normal.      Outpatient Encounter Medications as of 08/15/2021  Medication Sig   acetaminophen (TYLENOL) 500 MG tablet Take 1,000 mg by mouth every 6 (six) hours as needed for moderate pain.   ALPRAZolam (XANAX) 0.25 MG tablet TAKE ONE TABLET DAILY IF NEEDED FOR ANXIETY   cholecalciferol (VITAMIN D3) 25 MCG (1000 UNIT) tablet Take 1,000 Units by mouth daily.   ciprofloxacin-dexamethasone (CIPRODEX) OTIC suspension Place 4 drops into the right  ear 2 (two) times daily. X4-7 days   EPINEPHrine  0.3 mg/0.3 mL IJ SOAJ injection    Estriol 10 % CREA by Does not apply route.   famotidine (PEPCID) 40 MG tablet Take by mouth.   omeprazole (PRILOSEC) 40 MG capsule Take 40 mg by mouth daily.   ondansetron (ZOFRAN ODT) 4 MG disintegrating tablet Take 1 tablet (4 mg total) by mouth 2 (two) times daily as needed for nausea or vomiting.   predniSONE (DELTASONE) 10 MG tablet Take 4 tablets x 1 day and then decrease by 1/2 tablet per day until down to zero mg.   SUMAtriptan (IMITREX) 100 MG tablet    No facility-administered encounter medications on file as of 08/15/2021.     Lab Results  Component Value Date   WBC 5.6 04/02/2021   HGB 12.9 04/02/2021   HCT 38.3 04/02/2021   PLT 234.0 04/02/2021   GLUCOSE 77 04/02/2021   CHOL 195 04/02/2021   TRIG 110.0 04/02/2021   HDL 77.10 04/02/2021   LDLCALC 96 04/02/2021   ALT 15 04/02/2021   AST 18 04/02/2021   NA 141 04/02/2021   K 3.9 04/02/2021   CL 105 04/02/2021   CREATININE 0.59 04/02/2021   BUN 12 04/02/2021   CO2 30 04/02/2021   TSH 1.65 04/02/2021    DG PAIN CLINIC C-ARM 1-60 MIN NO REPORT  Result Date: 07/04/2021 Fluoro was used, but no Radiologist interpretation will be provided. Please refer to "NOTES" tab for provider progress note.      Assessment & Plan:   Problem List Items Addressed This Visit     Ear fullness    Ear fullness as outlined.  Pain radiates down jaw.  Dizziness as outlined.  Has been on ciprodex.  Applying heat.  Discussed possible TMJ.  Saw her dentist.  No problems found.  Wearing mouthguard.  Tried ciprodex and nasal sprays, including afrin. Saw ENT.  States clear.  Unclear etiology.  Ear drum - no redness.  No impaction.  Treat with prednisone taper as directed.  Can continue nasal spray.  Continue mouthguard.  Follow.  Call with update.  If persistent, will need referral back to ENT for evaluation.        GERD (gastroesophageal reflux disease)     Stable.  On prilosec.       TMJ arthralgia    Symptoms and exam as outlined.  Involving right side. Some discomfort.  Also having some ear issues and dizziness.  Saw her dentist.  Wearing mouthguard.  Follow.         [name], MD

## 2021-08-19 ENCOUNTER — Encounter: Payer: Self-pay | Admitting: Internal Medicine

## 2021-08-19 DIAGNOSIS — H938X9 Other specified disorders of ear, unspecified ear: Secondary | ICD-10-CM | POA: Insufficient documentation

## 2021-08-19 NOTE — Assessment & Plan Note (Addendum)
Symptoms and exam as outlined.  Involving right side. Some discomfort.  Also having some ear issues and dizziness.  Saw her dentist.  Wearing mouthguard.  Follow.

## 2021-08-19 NOTE — Assessment & Plan Note (Addendum)
Ear fullness as outlined.  Pain radiates down jaw.  Dizziness as outlined.  Has been on ciprodex.  Applying heat.  Discussed possible TMJ.  Saw her dentist.  No problems found.  Wearing mouthguard.  Tried ciprodex and nasal sprays, including afrin. Saw ENT.  States clear.  Unclear etiology.  Ear drum - no redness.  No impaction.  Treat with prednisone taper as directed.  Can continue nasal spray.  Continue mouthguard.  Follow.  Call with update.  If persistent, will need referral back to ENT for evaluation.

## 2021-08-19 NOTE — Assessment & Plan Note (Signed)
Stable.  On prilosec.

## 2021-08-20 DIAGNOSIS — J3089 Other allergic rhinitis: Secondary | ICD-10-CM | POA: Diagnosis not present

## 2021-08-20 DIAGNOSIS — J301 Allergic rhinitis due to pollen: Secondary | ICD-10-CM | POA: Diagnosis not present

## 2021-08-21 DIAGNOSIS — J301 Allergic rhinitis due to pollen: Secondary | ICD-10-CM | POA: Diagnosis not present

## 2021-08-22 ENCOUNTER — Encounter: Payer: Self-pay | Admitting: Internal Medicine

## 2021-08-22 DIAGNOSIS — J3089 Other allergic rhinitis: Secondary | ICD-10-CM | POA: Diagnosis not present

## 2021-08-27 DIAGNOSIS — J3089 Other allergic rhinitis: Secondary | ICD-10-CM | POA: Diagnosis not present

## 2021-08-27 DIAGNOSIS — J301 Allergic rhinitis due to pollen: Secondary | ICD-10-CM | POA: Diagnosis not present

## 2021-08-29 NOTE — Telephone Encounter (Signed)
Please call her and let her know that if she is continuing to have ear issues despite previous drops and prednisone, I would like for ENT to evaluated.  If agreeable, see if has a preference of which MD she prefers to see.

## 2021-08-29 NOTE — Telephone Encounter (Signed)
S/w pt - stated had seen Dr Carolynn Sayers at [facility]. Stated if by Monday her ear does not feel better, she will call over there and make another appt for eval.

## 2021-09-03 DIAGNOSIS — J3089 Other allergic rhinitis: Secondary | ICD-10-CM | POA: Diagnosis not present

## 2021-09-03 DIAGNOSIS — J301 Allergic rhinitis due to pollen: Secondary | ICD-10-CM | POA: Diagnosis not present

## 2021-09-10 ENCOUNTER — Ambulatory Visit (INDEPENDENT_AMBULATORY_CARE_PROVIDER_SITE_OTHER): Payer: BC Managed Care – PPO

## 2021-09-10 DIAGNOSIS — J301 Allergic rhinitis due to pollen: Secondary | ICD-10-CM | POA: Diagnosis not present

## 2021-09-10 DIAGNOSIS — E538 Deficiency of other specified B group vitamins: Secondary | ICD-10-CM | POA: Diagnosis not present

## 2021-09-10 DIAGNOSIS — J3089 Other allergic rhinitis: Secondary | ICD-10-CM | POA: Diagnosis not present

## 2021-09-10 MED ORDER — CYANOCOBALAMIN 1000 MCG/ML IJ SOLN
1000.0000 ug | Freq: Once | INTRAMUSCULAR | Status: AC
  Administered 2021-09-10: 1000 ug via INTRAMUSCULAR

## 2021-09-10 NOTE — Progress Notes (Signed)
Patient presented for B 12 injection to right deltoid, patient voiced no concerns nor showed any signs of distress during injection. 

## 2021-09-15 ENCOUNTER — Encounter: Payer: Self-pay | Admitting: Internal Medicine

## 2021-09-16 NOTE — Telephone Encounter (Signed)
I apologize the schedule was on the wrong date. I have an 11:30 tomorrow?  Judson Roch, CMA

## 2021-09-17 ENCOUNTER — Ambulatory Visit: Payer: BC Managed Care – PPO | Admitting: Family

## 2021-09-17 ENCOUNTER — Encounter: Payer: Self-pay | Admitting: Family

## 2021-09-17 VITALS — BP 130/82 | HR 81 | Temp 98.5°F | Ht 62.0 in | Wt 128.4 lb

## 2021-09-17 DIAGNOSIS — B029 Zoster without complications: Secondary | ICD-10-CM | POA: Diagnosis not present

## 2021-09-17 MED ORDER — VALACYCLOVIR HCL 1 G PO TABS: 1000.0000 mg | ORAL_TABLET | Freq: Three times a day (TID) | ORAL | 0 refills | Status: DC

## 2021-09-17 NOTE — Assessment & Plan Note (Addendum)
Presentation consistent with herpes zoster with pain although lesions are slightly atypical for zoster. Area doesn't appear infected. Agreed to trial Valtrex and patient remain vigilant in regards to any worsening or new symptoms. Counseled on her herpes zoster and how contagious particularly to pregnant or immunocompromised people ( her daughter is currently expecting).

## 2021-09-17 NOTE — Progress Notes (Signed)
Subjective:    Patient ID: Shannon [name], female    DOB: [birth year], 56 y.o.   MRN: [id number]  CC: Shannon [name] is a 56 y.o. female who presents today for an acute visit.    HPI: CC: painful linear rash left clavicle x 3 days, unchanged.  Burning and 'needles'.  Some improvement in that not as red as previous.   No fever, tick bite  She hasnt been working in yard.   No new lotions, medications.   She has tried topical benadryl and alcohol wipe with some relief.     H/o of shingles last year left buttocks  No h/o CKD   HISTORY:  Past Medical History:  Diagnosis Date   Anemia    Anxiety    Breast pain, right 11/07/2012   Breathing difficulty 06/20/2014   Environmental allergies    GERD (gastroesophageal reflux disease)    Hiatal hernia    small   History of migraine headaches    Hyperthyroidism    s/p ablation   Vaginitis 01/16/2013   Past Surgical History:  Procedure Laterality Date   BREAST BIOPSY Right 12/05/2015   neg   CESAREAN SECTION  11/98   Dr Roena Malady   CHOLECYSTECTOMY  11/05   Dr Pat Patrick   JOINT REPLACEMENT Left 2017   hip   NASAL SINUS SURGERY  11/93   SEPTOPLASTY  5/90   Dr Rossie Muskrat   TUBAL LIGATION  04/4032   UMBILICAL HERNIA REPAIR  02/1998   Family History  Problem Relation Age of Onset   Heart disease Father    Hypertension Father    Thyroid disease Father    Hypercholesterolemia Father    Kidney cancer Father    Leukemia Father        hairy cell   Hypercholesterolemia Mother    Thyroid disease Mother    Breast cancer Mother 25   Breast cancer Other        maternal great grandmother   Heart disease Maternal Grandfather        MI - age 77   Prostate cancer Paternal Grandfather    Pancreatic cancer Paternal Grandfather    Lung cancer Paternal Grandfather    Bladder Cancer Neg Hx     Allergies: Codeine, Decongest-aid [pseudoephedrine], Flagyl [metronidazole], and Sulfa antibiotics Current Outpatient Medications on File Prior  to Visit  Medication Sig Dispense Refill   acetaminophen (TYLENOL) 500 MG tablet Take 1,000 mg by mouth every 6 (six) hours as needed for moderate pain.     ALPRAZolam (XANAX) 0.25 MG tablet TAKE ONE TABLET DAILY IF NEEDED FOR ANXIETY 30 tablet 0   cholecalciferol (VITAMIN D3) 25 MCG (1000 UNIT) tablet Take 1,000 Units by mouth daily.     ciprofloxacin-dexamethasone (CIPRODEX) OTIC suspension Place 4 drops into the right ear 2 (two) times daily. X4-7 days 7.5 mL 0   EPINEPHrine 0.3 mg/0.3 mL IJ SOAJ injection      Estriol 10 % CREA by Does not apply route.     famotidine (PEPCID) 40 MG tablet Take by mouth.     omeprazole (PRILOSEC) 40 MG capsule Take 40 mg by mouth daily.     ondansetron (ZOFRAN ODT) 4 MG disintegrating tablet Take 1 tablet (4 mg total) by mouth 2 (two) times daily as needed for nausea or vomiting. 10 tablet 0   predniSONE (DELTASONE) 10 MG tablet Take 4 tablets x 1 day and then decrease by 1/2 tablet per day until down to zero  mg. 18 tablet 0   SUMAtriptan (IMITREX) 100 MG tablet      No current facility-administered medications on file prior to visit.    Social History   Tobacco Use   Smoking status: Former    Types: Cigarettes    Quit date: 01/28/2000    Years since quitting: 21.6   Smokeless tobacco: Never  Substance Use Topics   Alcohol use: No    Alcohol/week: 0.0 standard drinks of alcohol   Drug use: No    Review of Systems  Constitutional:  Negative for chills and fever.  Respiratory:  Negative for cough.   Cardiovascular:  Negative for chest pain and palpitations.  Gastrointestinal:  Negative for nausea and vomiting.  Skin:  Positive for rash.      Objective:    BP 130/82 (BP Location: Left Arm, Patient Position: Sitting, Cuff Size: Normal)   Pulse 81   Temp 98.5 F (36.9 C) (Oral)   Ht '5\' 2"'$  (1.575 m)   Wt 128 lb 6.4 oz (58.2 kg)   LMP 02/12/2013   SpO2 99%   BMI 23.48 kg/m    Physical Exam Vitals reviewed.  Constitutional:       Appearance: She is well-developed.  Eyes:     Conjunctiva/sclera: Conjunctivae normal.  Cardiovascular:     Rate and Rhythm: Normal rate and regular rhythm.     Pulses: Normal pulses.     Heart sounds: Normal heart sounds.  Pulmonary:     Effort: Pulmonary effort is normal.     Breath sounds: Normal breath sounds. No wheezing, rhonchi or rales.  Skin:    General: Skin is warm and dry.          Comments: 2-3 cm linear tender raised erythematous area.  No red streaks.  Area is nonfluctuant.  No vesicular lesions  Neurological:     Mental Status: She is alert.  Psychiatric:        Speech: Speech normal.        Behavior: Behavior normal.        Thought Content: Thought content normal.        Assessment & Plan:   Problem List Items Addressed This Visit       Other   Herpes zoster - Primary    Presentation consistent with herpes zoster with pain although lesions are slightly atypical for zoster. Area doesn't appear infected. Agreed to trial Valtrex and patient remain vigilant in regards to any worsening or new symptoms. Counseled on her herpes zoster and how contagious particularly to pregnant or immunocompromised people ( her daughter is currently expecting).       Relevant Medications   valACYclovir (VALTREX) 1000 MG tablet      I am having [name] start on valACYclovir. I am also having her maintain her omeprazole, acetaminophen, EPINEPHrine, famotidine, SUMAtriptan, ondansetron, cholecalciferol, Estriol, ALPRAZolam, ciprofloxacin-dexamethasone, and predniSONE.   Meds ordered this encounter  Medications   valACYclovir (VALTREX) 1000 MG tablet    Sig: Take 1 tablet (1,000 mg total) by mouth 3 (three) times daily.    Dispense:  21 tablet    Refill:  0    Order Specific Question:   Supervising Provider    Answer:   Crecencio Mc [[id number]]    Return precautions given.   Risks, benefits, and alternatives of the medications and treatment plan prescribed today  were discussed, and patient expressed understanding.   Education regarding symptom management and diagnosis given to patient on AVS.  Continue to follow with [name], MD for routine health maintenance.   [name] and I agreed with plan.   Mable Paris, FNP

## 2021-09-17 NOTE — Patient Instructions (Signed)
As discussed, I most likely think that this is a case of shingles although a bit atypical.  I have sent an antiviral for you to start.  As discussed, please avoid  people that have no history of chickenpox or immunocompromise and/or pregnant as we discussed regarding your daughter  Shingles  Shingles, which is also known as herpes zoster, is an infection that causes a painful skin rash and fluid-filled blisters. It is caused by a virus. Shingles only develops in people who: Have had chickenpox. Have been vaccinated against chickenpox. Shingles is rare in this group. What are the causes? Shingles is caused by varicella-zoster virus. This is the same virus that causes chickenpox. After a person is exposed to the virus, it stays in the body in an inactive (dormant) state. Shingles develops if the virus is reactivated. This can happen many years after the first (initial) exposure to the virus. It is not known what causes this virus to be reactivated. What increases the risk? People who have had chickenpox or received the chickenpox vaccine are at risk for shingles. Shingles infection is more common in people who: Are older than 57 years of age. Have a weakened disease-fighting system (immune system), such as people with: HIV (human immunodeficiency virus). AIDS (acquired immunodeficiency syndrome). Cancer. Are taking medicines that weaken the immune system, such as organ transplant medicines. Are experiencing a lot of stress. What are the signs or symptoms? Early symptoms of this condition include itching, tingling, and pain in an area on your skin. Pain may be described as burning, stabbing, or throbbing. A few days or weeks after early symptoms start, a painful red rash appears. The rash is usually on one side of the body and has a band-like or belt-like pattern. The rash eventually turns into fluid-filled blisters that break open, change into scabs, and dry up in about 2-3 weeks. At any time  during the infection, you may also develop: A fever. Chills. A headache. Nausea. How is this diagnosed? This condition is diagnosed with a skin exam. Skin or fluid samples (a culture) may be taken from the blisters before a diagnosis is made. How is this treated? The rash may last for several weeks. There is not a specific cure for this condition. Your health care provider may prescribe medicines to help you manage pain, recover more quickly, and avoid long-term problems. Medicines may include: Antiviral medicines. Anti-inflammatory medicines. Pain medicines. Anti-itching medicines (antihistamines). If the area involved is on your face, you may be referred to a specialist, such as an eye doctor (ophthalmologist) or an ear, nose, and throat (ENT) doctor (otorhinolaryngologist) to help you avoid eye problems, chronic pain, or disability. Follow these instructions at home: Medicines Take over-the-counter and prescription medicines only as told by your health care provider. Apply an anti-itch cream or numbing cream to the affected area as told by your health care provider. Relieving itching and discomfort  Apply cold, wet cloths (cold compresses) to the area of the rash or blisters as told by your health care provider. Cool baths can be soothing. Try adding baking soda or dry oatmeal to the water to reduce itching. Do not bathe in hot water. Use calamine lotion as recommended by your health care provider. This is an over-the-counter lotion that helps to relieve itchiness. Blister and rash care Keep your rash covered with a loose bandage (dressing). Wear loose-fitting clothing to help ease the pain of material rubbing against the rash. Wash your hands with soap and water for  at least 20 seconds before and after you change your dressing. If soap and water are not available, use hand sanitizer. Change your dressing as told by your health care provider. Keep your rash and blisters clean by washing  the area with mild soap and cool water as told by your health care provider. Check your rash every day for signs of infection. Check for: More redness, swelling, or pain. Fluid or blood. Warmth. Pus or a bad smell. Do not scratch your rash or pick at your blisters. To help avoid scratching: Keep your fingernails clean and cut short. Wear gloves or mittens while you sleep, if scratching is a problem. General instructions Rest as told by your health care provider. Wash your hands often with soap and water for at least 20 seconds. If soap and water are not available, use hand sanitizer. Doing this lowers your chance of getting a bacterial skin infection. Before your blisters change into scabs, your shingles infection can cause chickenpox in people who have never had it or have never been vaccinated against it. To prevent this from happening, avoid contact with other people, especially: Babies. Pregnant women. Children who have eczema. Older people who have transplants. People who have chronic illnesses, such as cancer or AIDS. Keep all follow-up visits. This is important. How is this prevented? Getting vaccinated is the best way to prevent shingles and protect against shingles complications. If you have not been vaccinated, talk with your health care provider about getting the vaccine. Where to find more information Centers for Disease Control and Prevention: http://www.wolf.info/ Contact a health care provider if: Your pain is not relieved with prescribed medicines. Your pain does not get better after the rash heals. You have any of these signs of infection: More redness, swelling, or pain around the rash. Fluid or blood coming from the rash. Warmth coming from your rash. Pus or a bad smell coming from the rash. A fever. Get help right away if: The rash is on your face or nose. You have facial pain, pain around your eye area, or loss of feeling on one side of your face. You have difficulty  seeing. You have ear pain or have ringing in your ear. You have a loss of taste. Your condition gets worse. Summary Shingles, also known as herpes zoster, is an infection that causes a painful skin rash and fluid-filled blisters. This condition is diagnosed with a skin exam. Skin or fluid samples (a culture) may be taken from the blisters. Keep your rash covered with a loose bandage (dressing). Wear loose-fitting clothing to help ease the pain of material rubbing against the rash. Before your blisters change into scabs, your shingles infection can cause chickenpox in people who have never had it or have never been vaccinated against it. This information is not intended to replace advice given to you by your health care provider. Make sure you discuss any questions you have with your health care provider. Document Revised: 01/09/2020 Document Reviewed: 01/09/2020 Elsevier Patient Education  [employer].

## 2021-09-19 ENCOUNTER — Other Ambulatory Visit: Payer: Self-pay | Admitting: Internal Medicine

## 2021-09-19 DIAGNOSIS — Z1231 Encounter for screening mammogram for malignant neoplasm of breast: Secondary | ICD-10-CM

## 2021-09-24 DIAGNOSIS — J301 Allergic rhinitis due to pollen: Secondary | ICD-10-CM | POA: Diagnosis not present

## 2021-09-24 DIAGNOSIS — J3089 Other allergic rhinitis: Secondary | ICD-10-CM | POA: Diagnosis not present

## 2021-10-01 DIAGNOSIS — J301 Allergic rhinitis due to pollen: Secondary | ICD-10-CM | POA: Diagnosis not present

## 2021-10-01 DIAGNOSIS — H25813 Combined forms of age-related cataract, bilateral: Secondary | ICD-10-CM | POA: Diagnosis not present

## 2021-10-01 DIAGNOSIS — J3089 Other allergic rhinitis: Secondary | ICD-10-CM | POA: Diagnosis not present

## 2021-10-08 DIAGNOSIS — J301 Allergic rhinitis due to pollen: Secondary | ICD-10-CM | POA: Diagnosis not present

## 2021-10-08 DIAGNOSIS — J3089 Other allergic rhinitis: Secondary | ICD-10-CM | POA: Diagnosis not present

## 2021-10-10 ENCOUNTER — Ambulatory Visit: Payer: BC Managed Care – PPO | Admitting: Internal Medicine

## 2021-10-14 ENCOUNTER — Ambulatory Visit (INDEPENDENT_AMBULATORY_CARE_PROVIDER_SITE_OTHER): Payer: BC Managed Care – PPO | Admitting: *Deleted

## 2021-10-14 DIAGNOSIS — E538 Deficiency of other specified B group vitamins: Secondary | ICD-10-CM

## 2021-10-14 MED ORDER — CYANOCOBALAMIN 1000 MCG/ML IJ SOLN
1000.0000 ug | Freq: Once | INTRAMUSCULAR | Status: AC
  Administered 2021-10-14: 1000 ug via INTRAMUSCULAR

## 2021-10-14 NOTE — Progress Notes (Signed)
Pt received B12 injection in right deltoid & tolerated it well with no issues or complaints.

## 2021-10-15 ENCOUNTER — Ambulatory Visit: Payer: BC Managed Care – PPO | Admitting: Family

## 2021-10-15 DIAGNOSIS — J301 Allergic rhinitis due to pollen: Secondary | ICD-10-CM | POA: Diagnosis not present

## 2021-10-15 DIAGNOSIS — J3089 Other allergic rhinitis: Secondary | ICD-10-CM | POA: Diagnosis not present

## 2021-10-16 DIAGNOSIS — G5603 Carpal tunnel syndrome, bilateral upper limbs: Secondary | ICD-10-CM | POA: Diagnosis not present

## 2021-10-21 ENCOUNTER — Ambulatory Visit
Admission: RE | Admit: 2021-10-21 | Discharge: 2021-10-21 | Disposition: A | Discharge status: Home / Self Care | Payer: BC Managed Care – PPO | Source: Ambulatory Visit | Attending: Internal Medicine | Admitting: Internal Medicine

## 2021-10-21 DIAGNOSIS — Z1231 Encounter for screening mammogram for malignant neoplasm of breast: Secondary | ICD-10-CM | POA: Diagnosis not present

## 2021-10-22 DIAGNOSIS — J3089 Other allergic rhinitis: Secondary | ICD-10-CM | POA: Diagnosis not present

## 2021-10-22 DIAGNOSIS — J301 Allergic rhinitis due to pollen: Secondary | ICD-10-CM | POA: Diagnosis not present

## 2021-10-29 DIAGNOSIS — J301 Allergic rhinitis due to pollen: Secondary | ICD-10-CM | POA: Diagnosis not present

## 2021-10-29 DIAGNOSIS — J3089 Other allergic rhinitis: Secondary | ICD-10-CM | POA: Diagnosis not present

## 2021-11-05 ENCOUNTER — Encounter: Payer: Self-pay | Admitting: Internal Medicine

## 2021-11-05 ENCOUNTER — Ambulatory Visit: Payer: BC Managed Care – PPO | Admitting: Internal Medicine

## 2021-11-05 VITALS — BP 102/54 | HR 96 | Temp 98.6°F | Ht 62.0 in | Wt 124.8 lb

## 2021-11-05 DIAGNOSIS — R1013 Epigastric pain: Secondary | ICD-10-CM

## 2021-11-05 DIAGNOSIS — J309 Allergic rhinitis, unspecified: Secondary | ICD-10-CM | POA: Diagnosis not present

## 2021-11-05 DIAGNOSIS — R079 Chest pain, unspecified: Secondary | ICD-10-CM | POA: Diagnosis not present

## 2021-11-05 DIAGNOSIS — F419 Anxiety disorder, unspecified: Secondary | ICD-10-CM

## 2021-11-05 DIAGNOSIS — K219 Gastro-esophageal reflux disease without esophagitis: Secondary | ICD-10-CM

## 2021-11-05 DIAGNOSIS — B029 Zoster without complications: Secondary | ICD-10-CM

## 2021-11-05 DIAGNOSIS — J301 Allergic rhinitis due to pollen: Secondary | ICD-10-CM | POA: Diagnosis not present

## 2021-11-05 DIAGNOSIS — J3081 Allergic rhinitis due to animal (cat) (dog) hair and dander: Secondary | ICD-10-CM | POA: Diagnosis not present

## 2021-11-05 DIAGNOSIS — J3089 Other allergic rhinitis: Secondary | ICD-10-CM | POA: Diagnosis not present

## 2021-11-05 LAB — CBC WITH DIFFERENTIAL/PLATELET
Basophils Absolute: 0.1 K/uL (ref 0.0–0.1)
Basophils Relative: 0.9 % (ref 0.0–3.0)
Eosinophils Absolute: 0 K/uL (ref 0.0–0.7)
Eosinophils Relative: 0.6 % (ref 0.0–5.0)
HCT: 40.4 % (ref 36.0–46.0)
Hemoglobin: 13.4 g/dL (ref 12.0–15.0)
Lymphocytes Relative: 24.1 % (ref 12.0–46.0)
Lymphs Abs: 1.7 K/uL (ref 0.7–4.0)
MCHC: 33.1 g/dL (ref 30.0–36.0)
MCV: 91.6 fl (ref 78.0–100.0)
Monocytes Absolute: 0.5 K/uL (ref 0.1–1.0)
Monocytes Relative: 7.1 % (ref 3.0–12.0)
Neutro Abs: 4.8 K/uL (ref 1.4–7.7)
Neutrophils Relative %: 67.3 % (ref 43.0–77.0)
Platelets: 239 K/uL (ref 150.0–400.0)
RBC: 4.41 Mil/uL (ref 3.87–5.11)
RDW: 13.1 % (ref 11.5–15.5)
WBC: 7.1 K/uL (ref 4.0–10.5)

## 2021-11-05 LAB — LIPASE: Lipase: 11 U/L (ref 11.0–59.0)

## 2021-11-05 LAB — HEPATIC FUNCTION PANEL
ALT: 14 U/L (ref 0–35)
AST: 15 U/L (ref 0–37)
Albumin: 4.1 g/dL (ref 3.5–5.2)
Alkaline Phosphatase: 64 U/L (ref 39–117)
Bilirubin, Direct: 0.1 mg/dL (ref 0.0–0.3)
Total Bilirubin: 0.5 mg/dL (ref 0.2–1.2)
Total Protein: 6.5 g/dL (ref 6.0–8.3)

## 2021-11-05 LAB — BASIC METABOLIC PANEL
BUN: 14 mg/dL (ref 6–23)
CO2: 29 mEq/L (ref 19–32)
Calcium: 9.5 mg/dL (ref 8.4–10.5)
Chloride: 102 mEq/L (ref 96–112)
Creatinine, Ser: 0.61 mg/dL (ref 0.40–1.20)
GFR: 98.74 mL/min (ref >60.00)
Glucose, Bld: 80 mg/dL (ref 70–99)
Potassium: 3.9 mEq/L (ref 3.5–5.1)
Sodium: 138 mEq/L (ref 135–145)

## 2021-11-05 NOTE — Progress Notes (Unsigned)
Patient ID: Shannon [name], female   DOB: [birth year], 56 y.o.   MRN: [id number]   Subjective:    Patient ID: Shannon [name], female    DOB: 11-Jul-1965, 56 y.o.   MRN: [id number]   Patient here for  Chief Complaint  Patient presents with   Follow-up    Follow up    .   HPI Here to follow up regarding GERD and increased stress.  Recently evaluated and diagnosed with shingles.  This has resolved.  Main concern today is increased gas.  States that over the last couple of weeks has noticed increased pressure - what she feels is gas - in her chest.  Takes gas x and helps. Burping helps.  Has been persistent/intermittent over the last two weeks.  No acid reflux.  Taking prilosec bid.  Tries stopping prilosec to see if contributing and had return of acid reflux after two days.  No chest pain or sob with exercise.  No cough or congestion.  Has tried bland diet.  No change.  Bowels moving.    Past Medical History:  Diagnosis Date   Anemia    Anxiety    Breast pain, right 11/07/2012   Breathing difficulty 06/20/2014   Environmental allergies    GERD (gastroesophageal reflux disease)    Hiatal hernia    small   History of migraine headaches    Hyperthyroidism    s/p ablation   Vaginitis 01/16/2013   Past Surgical History:  Procedure Laterality Date   BREAST BIOPSY Right 12/05/2015   neg   CESAREAN SECTION  11/98   Dr Roena Malady   CHOLECYSTECTOMY  11/05   Dr Pat Patrick   JOINT REPLACEMENT Left 2017   hip   NASAL SINUS SURGERY  11/93   SEPTOPLASTY  5/90   Dr Rossie Muskrat   TUBAL LIGATION  03/4740   UMBILICAL HERNIA REPAIR  02/1998   Family History  Problem Relation Age of Onset   Heart disease Father    Hypertension Father    Thyroid disease Father    Hypercholesterolemia Father    Kidney cancer Father    Leukemia Father        hairy cell   Hypercholesterolemia Mother    Thyroid disease Mother    Breast cancer Mother 32   Breast cancer Other        maternal great grandmother   Heart  disease Maternal Grandfather        MI - age 39   Prostate cancer Paternal Grandfather    Pancreatic cancer Paternal Grandfather    Lung cancer Paternal Grandfather    Bladder Cancer Neg Hx    Social History   Socioeconomic History   Marital status: Married    Spouse name: Not on file   Number of children: 1   Years of education: Not on file   Highest education level: Not on file  Occupational History   Not on file  Tobacco Use   Smoking status: Former    Types: Cigarettes    Quit date: 01/28/2000    Years since quitting: 21.7   Smokeless tobacco: Never  Substance and Sexual Activity   Alcohol use: No    Alcohol/week: 0.0 standard drinks of alcohol   Drug use: No   Sexual activity: Not on file  Other Topics Concern   Not on file  Social History Narrative   Not on file   Social Determinants of Health   Financial Resource Strain: Not on file  Food Insecurity: Not on file  Transportation Needs: Not on file  Physical Activity: Not on file  Stress: Not on file  Social Connections: Not on file     Review of Systems  Constitutional:  Negative for appetite change and unexpected weight change.  HENT:  Negative for congestion and sinus pressure.   Respiratory:  Negative for cough, chest tightness and shortness of breath.   Cardiovascular:  Negative for palpitations and leg swelling.  Gastrointestinal:  Negative for constipation, diarrhea, nausea and vomiting.  Genitourinary:  Negative for difficulty urinating and dysuria.  Musculoskeletal:  Negative for joint swelling and myalgias.  Skin:  Negative for color change and rash.  Neurological:  Negative for dizziness, light-headedness and headaches.  Psychiatric/Behavioral:  Negative for agitation and dysphoric mood.        Objective:     BP (!) 102/54 (BP Location: Left Arm, Patient Position: Sitting, Cuff Size: Normal)   Pulse 96   Temp 98.6 F (37 C) (Oral)   Ht '5\' 2"'$  (1.575 m)   Wt 124 lb 12.8 oz (56.6 kg)   LMP  02/12/2013   SpO2 96%   BMI 22.83 kg/m  Wt Readings from Last 3 Encounters:  11/05/21 124 lb 12.8 oz (56.6 kg)  09/17/21 128 lb 6.4 oz (58.2 kg)  08/15/21 128 lb 3.2 oz (58.2 kg)    Physical Exam Vitals reviewed.  Constitutional:      General: She is not in acute distress.    Appearance: Normal appearance.  HENT:     Head: Normocephalic and atraumatic.     Right Ear: External ear normal.     Left Ear: External ear normal.  Eyes:     General: No scleral icterus.       Right eye: No discharge.        Left eye: No discharge.     Conjunctiva/sclera: Conjunctivae normal.  Neck:     Thyroid: No thyromegaly.  Cardiovascular:     Rate and Rhythm: Normal rate and regular rhythm.  Pulmonary:     Effort: No respiratory distress.     Breath sounds: Normal breath sounds. No wheezing.  Abdominal:     General: Bowel sounds are normal.     Palpations: Abdomen is soft.     Comments: Epigastric tenderness to palpation  Musculoskeletal:        General: No swelling or tenderness.     Cervical back: Neck supple. No tenderness.  Lymphadenopathy:     Cervical: No cervical adenopathy.  Skin:    Findings: No erythema or rash.  Neurological:     Mental Status: She is alert.  Psychiatric:        Mood and Affect: Mood normal.        Behavior: Behavior normal.      Outpatient Encounter Medications as of 11/05/2021  Medication Sig   acetaminophen (TYLENOL) 500 MG tablet Take 1,000 mg by mouth every 6 (six) hours as needed for moderate pain.   ALPRAZolam (XANAX) 0.25 MG tablet TAKE ONE TABLET DAILY IF NEEDED FOR ANXIETY   cholecalciferol (VITAMIN D3) 25 MCG (1000 UNIT) tablet Take 1,000 Units by mouth daily.   ciprofloxacin-dexamethasone (CIPRODEX) OTIC suspension Place 4 drops into the right ear 2 (two) times daily. X4-7 days   EPINEPHrine 0.3 mg/0.3 mL IJ SOAJ injection    Estriol 10 % CREA by Does not apply route.   famotidine (PEPCID) 40 MG tablet Take by mouth.   omeprazole  (PRILOSEC) 40 MG capsule Take  40 mg by mouth daily.   ondansetron (ZOFRAN ODT) 4 MG disintegrating tablet Take 1 tablet (4 mg total) by mouth 2 (two) times daily as needed for nausea or vomiting.   predniSONE (DELTASONE) 10 MG tablet Take 4 tablets x 1 day and then decrease by 1/2 tablet per day until down to zero mg.   SUMAtriptan (IMITREX) 100 MG tablet    valACYclovir (VALTREX) 1000 MG tablet Take 1 tablet (1,000 mg total) by mouth 3 (three) times daily.   No facility-administered encounter medications on file as of 11/05/2021.     Lab Results  Component Value Date   WBC 7.1 11/05/2021   HGB 13.4 11/05/2021   HCT 40.4 11/05/2021   PLT 239.0 11/05/2021   GLUCOSE 80 11/05/2021   CHOL 195 04/02/2021   TRIG 110.0 04/02/2021   HDL 77.10 04/02/2021   LDLCALC 96 04/02/2021   ALT 14 11/05/2021   AST 15 11/05/2021   NA 138 11/05/2021   K 3.9 11/05/2021   CL 102 11/05/2021   CREATININE 0.61 11/05/2021   BUN 14 11/05/2021   CO2 29 11/05/2021   TSH 1.65 04/02/2021    MM 3D SCREEN BREAST BILATERAL  Result Date: 10/22/2021 CLINICAL DATA:  Screening. EXAM: DIGITAL SCREENING BILATERAL MAMMOGRAM WITH TOMOSYNTHESIS AND CAD TECHNIQUE: Bilateral screening digital craniocaudal and mediolateral oblique mammograms were obtained. Bilateral screening digital breast tomosynthesis was performed. The images were evaluated with computer-aided detection. COMPARISON:  Previous exam(s). ACR Breast Density Category c: The breast tissue is heterogeneously dense, which may obscure small masses. FINDINGS: There are no findings suspicious for malignancy. IMPRESSION: No mammographic evidence of malignancy. A result letter of this screening mammogram will be mailed directly to the patient. RECOMMENDATION: Screening mammogram in one year. (Code:SM-B-01Y) BI-RADS CATEGORY  1: Negative. Electronically Signed   By: Everlean Alstrom M.D.   On: 10/22/2021 09:11       Assessment & Plan:   Problem List Items Addressed  This Visit     Allergic rhinitis    Sees an allergist.  Stable.       Anxiety    Overall appears to be handling things relatively well.  Follow.       Chest pain    Describes the discomfort as outlined.  Relieved with belching.  Not aggravated with exercise.  Gas X helps.  EKG obtained given persistent pain and revealed SR with no acute ischemic changes.  Will decrease prilosec to q day.  No acid reflux on bid.  Continue Gas X.  Check abdominal ultrasound.  Follow.       Relevant Orders   EKG 12-Lead   US Abdomen Complete   Epigastric pain - Primary    Epigastric tenderness noted on exam.  Increased pressure - chest - relieved with belching.  Gas X helps.  Check cbc, liver and lipase.  Decreased prilosec to q day.  Continue Gas X.  Check abdominal ultrasound.        Relevant Orders   CBC with Differential/Platelet (Completed)   Hepatic function panel (Completed)   Basic metabolic panel (Completed)   Lipase (Completed)   US Abdomen Complete   GERD (gastroesophageal reflux disease)    No increased acid reflux currently.  On prilosec bid.  When stopped, symptoms returned in 2 days.  Will try decreasing to q day.  Follow.       Herpes zoster    Resolved.         [name], MD

## 2021-11-06 ENCOUNTER — Encounter: Payer: Self-pay | Admitting: Internal Medicine

## 2021-11-06 NOTE — Assessment & Plan Note (Signed)
Sees an allergist.  Stable.

## 2021-11-06 NOTE — Assessment & Plan Note (Signed)
Describes the discomfort as outlined.  Relieved with belching.  Not aggravated with exercise.  Gas X helps.  EKG obtained given persistent pain and revealed SR with no acute ischemic changes.  Will decrease prilosec to q day.  No acid reflux on bid.  Continue Gas X.  Check abdominal ultrasound.  Follow.

## 2021-11-06 NOTE — Assessment & Plan Note (Signed)
Overall appears to be handling things relatively well.  Follow.   

## 2021-11-06 NOTE — Assessment & Plan Note (Signed)
Resolved

## 2021-11-06 NOTE — Assessment & Plan Note (Signed)
Epigastric tenderness noted on exam.  Increased pressure - chest - relieved with belching.  Gas X helps.  Check cbc, liver and lipase.  Decreased prilosec to q day.  Continue Gas X.  Check abdominal ultrasound.

## 2021-11-06 NOTE — Assessment & Plan Note (Signed)
No increased acid reflux currently.  On prilosec bid.  When stopped, symptoms returned in 2 days.  Will try decreasing to q day.  Follow.

## 2021-11-12 DIAGNOSIS — J3089 Other allergic rhinitis: Secondary | ICD-10-CM | POA: Diagnosis not present

## 2021-11-12 DIAGNOSIS — J301 Allergic rhinitis due to pollen: Secondary | ICD-10-CM | POA: Diagnosis not present

## 2021-11-12 DIAGNOSIS — J3081 Allergic rhinitis due to animal (cat) (dog) hair and dander: Secondary | ICD-10-CM | POA: Diagnosis not present

## 2021-11-14 ENCOUNTER — Ambulatory Visit: Payer: BC Managed Care – PPO

## 2021-11-18 ENCOUNTER — Ambulatory Visit
Admission: RE | Admit: 2021-11-18 | Discharge: 2021-11-18 | Disposition: A | Discharge status: Home / Self Care | Payer: BC Managed Care – PPO | Source: Ambulatory Visit | Attending: Internal Medicine | Admitting: Internal Medicine

## 2021-11-18 ENCOUNTER — Ambulatory Visit (INDEPENDENT_AMBULATORY_CARE_PROVIDER_SITE_OTHER): Payer: BC Managed Care – PPO

## 2021-11-18 DIAGNOSIS — R1013 Epigastric pain: Secondary | ICD-10-CM | POA: Diagnosis not present

## 2021-11-18 DIAGNOSIS — Z23 Encounter for immunization: Secondary | ICD-10-CM | POA: Diagnosis not present

## 2021-11-18 DIAGNOSIS — R079 Chest pain, unspecified: Secondary | ICD-10-CM | POA: Insufficient documentation

## 2021-11-18 DIAGNOSIS — E538 Deficiency of other specified B group vitamins: Secondary | ICD-10-CM

## 2021-11-18 MED ORDER — CYANOCOBALAMIN 1000 MCG/ML IJ SOLN
1000.0000 ug | Freq: Once | INTRAMUSCULAR | Status: AC
  Administered 2021-11-18: 1000 ug via INTRAMUSCULAR

## 2021-11-18 NOTE — Progress Notes (Signed)
Patient presented for B 12 injection to left deltoid, patient voiced no concerns nor showed any signs of distress during injection. 

## 2021-11-19 DIAGNOSIS — J301 Allergic rhinitis due to pollen: Secondary | ICD-10-CM | POA: Diagnosis not present

## 2021-11-19 DIAGNOSIS — J3089 Other allergic rhinitis: Secondary | ICD-10-CM | POA: Diagnosis not present

## 2021-11-26 DIAGNOSIS — J3081 Allergic rhinitis due to animal (cat) (dog) hair and dander: Secondary | ICD-10-CM | POA: Diagnosis not present

## 2021-11-26 DIAGNOSIS — J3089 Other allergic rhinitis: Secondary | ICD-10-CM | POA: Diagnosis not present

## 2021-11-26 DIAGNOSIS — J301 Allergic rhinitis due to pollen: Secondary | ICD-10-CM | POA: Diagnosis not present

## 2021-12-03 DIAGNOSIS — J3089 Other allergic rhinitis: Secondary | ICD-10-CM | POA: Diagnosis not present

## 2021-12-03 DIAGNOSIS — J301 Allergic rhinitis due to pollen: Secondary | ICD-10-CM | POA: Diagnosis not present

## 2021-12-10 DIAGNOSIS — J3089 Other allergic rhinitis: Secondary | ICD-10-CM | POA: Diagnosis not present

## 2021-12-10 DIAGNOSIS — J3081 Allergic rhinitis due to animal (cat) (dog) hair and dander: Secondary | ICD-10-CM | POA: Diagnosis not present

## 2021-12-10 DIAGNOSIS — J301 Allergic rhinitis due to pollen: Secondary | ICD-10-CM | POA: Diagnosis not present

## 2021-12-17 DIAGNOSIS — J301 Allergic rhinitis due to pollen: Secondary | ICD-10-CM | POA: Diagnosis not present

## 2021-12-17 DIAGNOSIS — J3089 Other allergic rhinitis: Secondary | ICD-10-CM | POA: Diagnosis not present

## 2021-12-23 ENCOUNTER — Ambulatory Visit (INDEPENDENT_AMBULATORY_CARE_PROVIDER_SITE_OTHER): Payer: BC Managed Care – PPO

## 2021-12-23 DIAGNOSIS — E538 Deficiency of other specified B group vitamins: Secondary | ICD-10-CM | POA: Diagnosis not present

## 2021-12-23 MED ORDER — CYANOCOBALAMIN 1000 MCG/ML IJ SOLN
1000.0000 ug | Freq: Once | INTRAMUSCULAR | Status: AC
  Administered 2021-12-23: 1000 ug via INTRAMUSCULAR

## 2021-12-23 NOTE — Progress Notes (Signed)
Pt presented for their vitamin B12 injection. Pt was identified through two identifiers. Pt tolerated shot well in their left  deltoid.  

## 2021-12-24 DIAGNOSIS — H1045 Other chronic allergic conjunctivitis: Secondary | ICD-10-CM | POA: Diagnosis not present

## 2021-12-24 DIAGNOSIS — R21 Rash and other nonspecific skin eruption: Secondary | ICD-10-CM | POA: Diagnosis not present

## 2021-12-24 DIAGNOSIS — J3089 Other allergic rhinitis: Secondary | ICD-10-CM | POA: Diagnosis not present

## 2021-12-24 DIAGNOSIS — J301 Allergic rhinitis due to pollen: Secondary | ICD-10-CM | POA: Diagnosis not present

## 2021-12-24 DIAGNOSIS — R052 Subacute cough: Secondary | ICD-10-CM | POA: Diagnosis not present

## 2021-12-31 DIAGNOSIS — J301 Allergic rhinitis due to pollen: Secondary | ICD-10-CM | POA: Diagnosis not present

## 2021-12-31 DIAGNOSIS — J3089 Other allergic rhinitis: Secondary | ICD-10-CM | POA: Diagnosis not present

## 2022-01-03 ENCOUNTER — Ambulatory Visit: Payer: BC Managed Care – PPO | Admitting: Internal Medicine

## 2022-01-07 DIAGNOSIS — J301 Allergic rhinitis due to pollen: Secondary | ICD-10-CM | POA: Diagnosis not present

## 2022-01-07 DIAGNOSIS — J3089 Other allergic rhinitis: Secondary | ICD-10-CM | POA: Diagnosis not present

## 2022-01-07 DIAGNOSIS — J3081 Allergic rhinitis due to animal (cat) (dog) hair and dander: Secondary | ICD-10-CM | POA: Diagnosis not present

## 2022-01-14 DIAGNOSIS — J3089 Other allergic rhinitis: Secondary | ICD-10-CM | POA: Diagnosis not present

## 2022-01-14 DIAGNOSIS — J3081 Allergic rhinitis due to animal (cat) (dog) hair and dander: Secondary | ICD-10-CM | POA: Diagnosis not present

## 2022-01-14 DIAGNOSIS — J301 Allergic rhinitis due to pollen: Secondary | ICD-10-CM | POA: Diagnosis not present

## 2022-01-23 ENCOUNTER — Ambulatory Visit (INDEPENDENT_AMBULATORY_CARE_PROVIDER_SITE_OTHER): Payer: BC Managed Care – PPO

## 2022-01-23 DIAGNOSIS — E538 Deficiency of other specified B group vitamins: Secondary | ICD-10-CM

## 2022-01-23 DIAGNOSIS — J301 Allergic rhinitis due to pollen: Secondary | ICD-10-CM | POA: Diagnosis not present

## 2022-01-23 DIAGNOSIS — J3081 Allergic rhinitis due to animal (cat) (dog) hair and dander: Secondary | ICD-10-CM | POA: Diagnosis not present

## 2022-01-23 DIAGNOSIS — J3089 Other allergic rhinitis: Secondary | ICD-10-CM | POA: Diagnosis not present

## 2022-01-23 MED ORDER — CYANOCOBALAMIN 1000 MCG/ML IJ SOLN
1000.0000 ug | Freq: Once | INTRAMUSCULAR | Status: AC
  Administered 2022-01-23: 1000 ug via INTRAMUSCULAR

## 2022-01-23 NOTE — Progress Notes (Signed)
Pt presented to clinic for B12 inj.  Pt was given b12 in R Deltoid. Pt did not show any signs or symptoms of distress or issue.  Pt released with no restriction.

## 2022-02-03 DIAGNOSIS — D225 Melanocytic nevi of trunk: Secondary | ICD-10-CM | POA: Diagnosis not present

## 2022-02-03 DIAGNOSIS — G43019 Migraine without aura, intractable, without status migrainosus: Secondary | ICD-10-CM | POA: Diagnosis not present

## 2022-02-03 DIAGNOSIS — Z85828 Personal history of other malignant neoplasm of skin: Secondary | ICD-10-CM | POA: Diagnosis not present

## 2022-02-03 DIAGNOSIS — D2261 Melanocytic nevi of right upper limb, including shoulder: Secondary | ICD-10-CM | POA: Diagnosis not present

## 2022-02-03 DIAGNOSIS — D2262 Melanocytic nevi of left upper limb, including shoulder: Secondary | ICD-10-CM | POA: Diagnosis not present

## 2022-02-04 DIAGNOSIS — J3081 Allergic rhinitis due to animal (cat) (dog) hair and dander: Secondary | ICD-10-CM | POA: Diagnosis not present

## 2022-02-04 DIAGNOSIS — J3089 Other allergic rhinitis: Secondary | ICD-10-CM | POA: Diagnosis not present

## 2022-02-04 DIAGNOSIS — J301 Allergic rhinitis due to pollen: Secondary | ICD-10-CM | POA: Diagnosis not present

## 2022-02-06 ENCOUNTER — Ambulatory Visit: Payer: BC Managed Care – PPO | Admitting: Internal Medicine

## 2022-02-06 ENCOUNTER — Encounter: Payer: Self-pay | Admitting: Internal Medicine

## 2022-02-06 DIAGNOSIS — F419 Anxiety disorder, unspecified: Secondary | ICD-10-CM | POA: Diagnosis not present

## 2022-02-06 DIAGNOSIS — K219 Gastro-esophageal reflux disease without esophagitis: Secondary | ICD-10-CM

## 2022-02-06 DIAGNOSIS — M79621 Pain in right upper arm: Secondary | ICD-10-CM | POA: Diagnosis not present

## 2022-02-06 DIAGNOSIS — D649 Anemia, unspecified: Secondary | ICD-10-CM | POA: Diagnosis not present

## 2022-02-06 DIAGNOSIS — R1013 Epigastric pain: Secondary | ICD-10-CM

## 2022-02-06 MED ORDER — ONDANSETRON 4 MG PO TBDP: 4.0000 mg | ORAL_TABLET | Freq: Two times a day (BID) | ORAL | 0 refills | Status: DC | PRN

## 2022-02-06 NOTE — Assessment & Plan Note (Signed)
Schedule ultrasound - axilla.

## 2022-02-06 NOTE — Assessment & Plan Note (Signed)
Overall appears to be handling things relatively well.  Follow.   

## 2022-02-06 NOTE — Assessment & Plan Note (Signed)
Follow cbc.  Colonoscopy 11/2014 -diverticulosis.

## 2022-02-06 NOTE — Assessment & Plan Note (Signed)
On prilosec.  Increased gas, bloating, etc as outlined.  Refer to GI.

## 2022-02-06 NOTE — Assessment & Plan Note (Addendum)
Epigastric pain with increased gas and bloating.  Changed diet.  Helped.  This past week, symptoms improved.  Continues on prilosec.  If does not take, has increased acid reflux.  Continue prilosec.  F/u with GI with question of need for f/u EGD.  Recent ultrasound ok.

## 2022-02-06 NOTE — Progress Notes (Signed)
Subjective:    Patient ID: Shannon [name], female    DOB: 02-19-1965, 57 y.o.   MRN: [id number]  Patient here for  Chief Complaint  Patient presents with   Medical Management of Chronic Issues   Gastroesophageal Reflux    HPI Here for f/u appt.  Follow up regarding GERD.  Reports that starting around Christmas, she noticed increased gas, bloating and epigastric discomfort.  Took pepcid, Gas X and mylanta.  Takes prilosec regularly.  Persistent symptoms.  Adjusted diet.  Decreased junk food. Since adjusting diet, GI symptoms have improved/resolved.  Better this past week.  No chest pain or sob reported.  No increased cough or congestion.  Does report tender area - right axilla.     Past Medical History:  Diagnosis Date   Anemia    Anxiety    Breast pain, right 11/07/2012   Breathing difficulty 06/20/2014   Environmental allergies    GERD (gastroesophageal reflux disease)    Hiatal hernia    small   History of migraine headaches    Hyperthyroidism    s/p ablation   Vaginitis 01/16/2013   Past Surgical History:  Procedure Laterality Date   BREAST BIOPSY Right 12/05/2015   neg   CESAREAN SECTION  11/98   Dr Roena Malady   CHOLECYSTECTOMY  11/05   Dr Pat Patrick   JOINT REPLACEMENT Left 2017   hip   NASAL SINUS SURGERY  11/93   SEPTOPLASTY  5/90   Dr Rossie Muskrat   TUBAL LIGATION  09/1503   UMBILICAL HERNIA REPAIR  02/1998   Family History  Problem Relation Age of Onset   Heart disease Father    Hypertension Father    Thyroid disease Father    Hypercholesterolemia Father    Kidney cancer Father    Leukemia Father        hairy cell   Hypercholesterolemia Mother    Thyroid disease Mother    Breast cancer Mother 34   Breast cancer Other        maternal great grandmother   Heart disease Maternal Grandfather        MI - age 63   Prostate cancer Paternal Grandfather    Pancreatic cancer Paternal Grandfather    Lung cancer Paternal Grandfather    Bladder Cancer Neg Hx    Social  History   Socioeconomic History   Marital status: Married    Spouse name: Not on file   Number of children: 1   Years of education: Not on file   Highest education level: Not on file  Occupational History   Not on file  Tobacco Use   Smoking status: Former    Types: Cigarettes    Quit date: 01/28/2000    Years since quitting: 22.0   Smokeless tobacco: Never  Substance and Sexual Activity   Alcohol use: No    Alcohol/week: 0.0 standard drinks of alcohol   Drug use: No   Sexual activity: Not on file  Other Topics Concern   Not on file  Social History Narrative   Not on file   Social Determinants of Health   Financial Resource Strain: Not on file  Food Insecurity: Not on file  Transportation Needs: Not on file  Physical Activity: Not on file  Stress: Not on file  Social Connections: Not on file     Review of Systems  Constitutional:  Negative for appetite change and unexpected weight change.  HENT:  Negative for congestion and sinus pressure.  Respiratory:  Negative for cough, chest tightness and shortness of breath.   Cardiovascular:  Negative for chest pain, palpitations and leg swelling.  Gastrointestinal:  Negative for diarrhea.       Previous GI symptoms as outlined.  Bloating and increased gas - improved.    Genitourinary:  Negative for difficulty urinating and dysuria.  Musculoskeletal:  Negative for joint swelling and myalgias.  Skin:  Negative for color change and rash.  Neurological:  Negative for dizziness and headaches.  Psychiatric/Behavioral:  Negative for agitation and dysphoric mood.        Objective:     BP 118/80 (BP Location: Left Arm, Patient Position: Sitting, Cuff Size: Small)   Pulse 80   Temp 97.7 F (36.5 C) (Temporal)   Resp 14   Ht '5\' 2"'$  (1.575 m)   Wt 124 lb 9.6 oz (56.5 kg)   LMP 02/12/2013   SpO2 98%   BMI 22.79 kg/m  Wt Readings from Last 3 Encounters:  02/06/22 124 lb 9.6 oz (56.5 kg)  11/05/21 124 lb 12.8 oz (56.6 kg)   09/17/21 128 lb 6.4 oz (58.2 kg)    Physical Exam Vitals reviewed.  Constitutional:      General: She is not in acute distress.    Appearance: Normal appearance.  HENT:     Head: Normocephalic and atraumatic.     Right Ear: External ear normal.     Left Ear: External ear normal.  Eyes:     General: No scleral icterus.       Right eye: No discharge.        Left eye: No discharge.     Conjunctiva/sclera: Conjunctivae normal.  Neck:     Thyroid: No thyromegaly.  Cardiovascular:     Rate and Rhythm: Normal rate and regular rhythm.  Pulmonary:     Effort: No respiratory distress.     Breath sounds: Normal breath sounds. No wheezing.  Abdominal:     General: Bowel sounds are normal.     Palpations: Abdomen is soft.     Tenderness: There is no abdominal tenderness.  Musculoskeletal:        General: No swelling or tenderness.     Cervical back: Neck supple. No tenderness.  Lymphadenopathy:     Cervical: No cervical adenopathy.  Skin:    Findings: No erythema or rash.  Neurological:     Mental Status: She is alert.  Psychiatric:        Mood and Affect: Mood normal.        Behavior: Behavior normal.      Outpatient Encounter Medications as of 02/06/2022  Medication Sig   acetaminophen (TYLENOL) 500 MG tablet Take 1,000 mg by mouth every 6 (six) hours as needed for moderate pain.   ALPRAZolam (XANAX) 0.25 MG tablet TAKE ONE TABLET DAILY IF NEEDED FOR ANXIETY   cholecalciferol (VITAMIN D3) 25 MCG (1000 UNIT) tablet Take 1,000 Units by mouth daily.   EPINEPHrine 0.3 mg/0.3 mL IJ SOAJ injection    Estriol 10 % CREA by Does not apply route.   famotidine (PEPCID) 40 MG tablet Take by mouth.   omeprazole (PRILOSEC) 40 MG capsule Take 40 mg by mouth daily.   SUMAtriptan (IMITREX) 100 MG tablet    [DISCONTINUED] ondansetron (ZOFRAN ODT) 4 MG disintegrating tablet Take 1 tablet (4 mg total) by mouth 2 (two) times daily as needed for nausea or vomiting.   ondansetron (ZOFRAN ODT) 4  MG disintegrating tablet Take 1 tablet (4 mg  total) by mouth 2 (two) times daily as needed for nausea or vomiting.   [DISCONTINUED] ciprofloxacin-dexamethasone (CIPRODEX) OTIC suspension Place 4 drops into the right ear 2 (two) times daily. X4-7 days   [DISCONTINUED] predniSONE (DELTASONE) 10 MG tablet Take 4 tablets x 1 day and then decrease by 1/2 tablet per day until down to zero mg.   [DISCONTINUED] valACYclovir (VALTREX) 1000 MG tablet Take 1 tablet (1,000 mg total) by mouth 3 (three) times daily.   No facility-administered encounter medications on file as of 02/06/2022.     Lab Results  Component Value Date   WBC 7.1 11/05/2021   HGB 13.4 11/05/2021   HCT 40.4 11/05/2021   PLT 239.0 11/05/2021   GLUCOSE 80 11/05/2021   CHOL 195 04/02/2021   TRIG 110.0 04/02/2021   HDL 77.10 04/02/2021   LDLCALC 96 04/02/2021   ALT 14 11/05/2021   AST 15 11/05/2021   NA 138 11/05/2021   K 3.9 11/05/2021   CL 102 11/05/2021   CREATININE 0.61 11/05/2021   BUN 14 11/05/2021   CO2 29 11/05/2021   TSH 1.65 04/02/2021    US Abdomen Complete  Result Date: 11/18/2021 CLINICAL DATA:  Epigastric pain. EXAM: ABDOMEN ULTRASOUND COMPLETE COMPARISON:  Renal ultrasound July 31, 2020, CT abdomen pelvis July 28, 2017 FINDINGS: Gallbladder: Prior cholecystectomy. Common bile duct: Diameter: 4 mm Liver: No focal lesion identified. Within normal limits in parenchymal echogenicity. Portal vein is patent on color Doppler imaging with normal direction of blood flow towards the liver. IVC: No abnormality visualized. Pancreas: Visualized portion unremarkable. Spleen: Size and appearance within normal limits. Right Kidney: Length: 9.9 cm. Echogenicity within normal limits. No mass or hydronephrosis visualized. Left Kidney: Length: 10.2 cm. Echogenicity within normal limits. No mass or hydronephrosis visualized. Abdominal aorta: No aneurysm visualized. Other findings: None. IMPRESSION: No acute abnormality identified. Prior  cholecystectomy. Electronically Signed   By: Abelardo Diesel M.D.   On: 11/18/2021 13:45       Assessment & Plan:  Anemia, unspecified type Assessment & Plan: Follow cbc.  Colonoscopy 11/2014 -diverticulosis.    Anxiety Assessment & Plan: Overall appears to be handling things relatively well.  Follow.    Axillary tenderness, right Assessment & Plan: Schedule ultrasound - axilla.    Orders: -     [place] AXILLA RIGHT; Future  Epigastric pain Assessment & Plan: Epigastric pain with increased gas and bloating.  Changed diet.  Helped.  This past week, symptoms improved.  Continues on prilosec.  If does not take, has increased acid reflux.  Continue prilosec.  F/u with GI with question of need for f/u EGD.  Recent ultrasound ok.   Orders: -     Ambulatory referral to Gastroenterology  Gastroesophageal reflux disease without esophagitis Assessment & Plan: On prilosec.  Increased gas, bloating, etc as outlined.  Refer to GI.   Orders: -     Ambulatory referral to Gastroenterology  Other orders -     Ondansetron; Take 1 tablet (4 mg total) by mouth 2 (two) times daily as needed for nausea or vomiting.  Dispense: 10 tablet; Refill: 0     [name], MD

## 2022-02-11 DIAGNOSIS — J301 Allergic rhinitis due to pollen: Secondary | ICD-10-CM | POA: Diagnosis not present

## 2022-02-11 DIAGNOSIS — J3081 Allergic rhinitis due to animal (cat) (dog) hair and dander: Secondary | ICD-10-CM | POA: Diagnosis not present

## 2022-02-11 DIAGNOSIS — J3089 Other allergic rhinitis: Secondary | ICD-10-CM | POA: Diagnosis not present

## 2022-02-12 ENCOUNTER — Other Ambulatory Visit: Payer: Self-pay | Admitting: Internal Medicine

## 2022-02-12 DIAGNOSIS — R2231 Localized swelling, mass and lump, right upper limb: Secondary | ICD-10-CM

## 2022-02-12 NOTE — Progress Notes (Signed)
Order placed for ultrasound

## 2022-02-17 ENCOUNTER — Ambulatory Visit
Admission: RE | Admit: 2022-02-17 | Discharge: 2022-02-17 | Disposition: A | Discharge status: Home / Self Care | Payer: BC Managed Care – PPO | Source: Ambulatory Visit | Attending: Internal Medicine | Admitting: Internal Medicine

## 2022-02-17 DIAGNOSIS — R2231 Localized swelling, mass and lump, right upper limb: Secondary | ICD-10-CM | POA: Insufficient documentation

## 2022-02-18 DIAGNOSIS — J301 Allergic rhinitis due to pollen: Secondary | ICD-10-CM | POA: Diagnosis not present

## 2022-02-18 DIAGNOSIS — J3089 Other allergic rhinitis: Secondary | ICD-10-CM | POA: Diagnosis not present

## 2022-02-24 ENCOUNTER — Ambulatory Visit (INDEPENDENT_AMBULATORY_CARE_PROVIDER_SITE_OTHER): Payer: BC Managed Care – PPO

## 2022-02-24 DIAGNOSIS — E538 Deficiency of other specified B group vitamins: Secondary | ICD-10-CM

## 2022-02-24 DIAGNOSIS — J301 Allergic rhinitis due to pollen: Secondary | ICD-10-CM | POA: Diagnosis not present

## 2022-02-24 MED ORDER — CYANOCOBALAMIN 1000 MCG/ML IJ SOLN
1000.0000 ug | Freq: Once | INTRAMUSCULAR | Status: AC
  Administered 2022-02-24: 1000 ug via INTRAMUSCULAR

## 2022-02-24 NOTE — Progress Notes (Signed)
Pt arrived at office for b12 injection.  Pt given b12 injection in R deltoid without issue. Pt showed no signs of distress or complication.  Pt released without restriction.

## 2022-02-25 DIAGNOSIS — J301 Allergic rhinitis due to pollen: Secondary | ICD-10-CM | POA: Diagnosis not present

## 2022-02-25 DIAGNOSIS — J3089 Other allergic rhinitis: Secondary | ICD-10-CM | POA: Diagnosis not present

## 2022-03-03 DIAGNOSIS — G5603 Carpal tunnel syndrome, bilateral upper limbs: Secondary | ICD-10-CM | POA: Diagnosis not present

## 2022-03-04 DIAGNOSIS — J3089 Other allergic rhinitis: Secondary | ICD-10-CM | POA: Diagnosis not present

## 2022-03-04 DIAGNOSIS — J3081 Allergic rhinitis due to animal (cat) (dog) hair and dander: Secondary | ICD-10-CM | POA: Diagnosis not present

## 2022-03-04 DIAGNOSIS — J301 Allergic rhinitis due to pollen: Secondary | ICD-10-CM | POA: Diagnosis not present

## 2022-03-06 DIAGNOSIS — K219 Gastro-esophageal reflux disease without esophagitis: Secondary | ICD-10-CM | POA: Diagnosis not present

## 2022-03-06 DIAGNOSIS — R1013 Epigastric pain: Secondary | ICD-10-CM | POA: Diagnosis not present

## 2022-03-11 DIAGNOSIS — J3081 Allergic rhinitis due to animal (cat) (dog) hair and dander: Secondary | ICD-10-CM | POA: Diagnosis not present

## 2022-03-11 DIAGNOSIS — J301 Allergic rhinitis due to pollen: Secondary | ICD-10-CM | POA: Diagnosis not present

## 2022-03-11 DIAGNOSIS — J3089 Other allergic rhinitis: Secondary | ICD-10-CM | POA: Diagnosis not present

## 2022-03-16 ENCOUNTER — Encounter: Payer: Self-pay | Admitting: Internal Medicine

## 2022-03-17 NOTE — Progress Notes (Unsigned)
PROVIDER NOTE: Information contained herein reflects review and annotations entered in association with encounter. Interpretation of such information and data should be left to medically-trained personnel. Information provided to patient can be located elsewhere in the medical record under "Patient Instructions". Document created using STT-dictation technology, any transcriptional errors that may result from process are unintentional.    Patient: Shannon [name]  Service Category: E/M  Provider: Gaspar Cola, MD  DOB: July 09, 1965  DOS: 03/18/2022  Referring Provider: [name], MD  MRN: [id number]  Specialty: Interventional Pain Management  PCP: [name], MD  Type: Established Patient  Setting: Ambulatory outpatient    Location: Office  Delivery: Face-to-face     HPI  Shannon [name], a 57 y.o. year old female, is here today because of her Chronic bilateral low back pain without sciatica [M54.50, G89.29]. Shannon [name] primary complain today is No chief complaint on file. Last encounter: My last encounter with her was on Visit date not found. Pertinent problems: Shannon [name] has Migraine; Epigastric pain; Chronic low back pain (1ry area of Pain) (Bilateral) (R>L); Lumbar lateral recess stenosis (L4-5) (Left); Lumbar facet hypertrophy; Chronic lumbar radicular pain (intermittent) (L4 Dermatome) (Left); Chronic hip pain (resolved after hip replacement) (Left); Lumbar spondylosis; Osteoarthritis of sacroiliac joint (Bilateral) (R>L); History of hip replacement (Left); Chronic sacroiliac joint pain (Right); Chronic myofascial pain syndrome (Right Gluteous Muscle); Chronic pain syndrome; Other specified dorsopathies, sacral and sacrococcygeal region; Lumbar Facet syndrome (Bilateral) (R>L); Spondylosis without myelopathy or radiculopathy, lumbosacral region; Chronic toe pain (Right); Great toe pain (Right); Osteoarthritis of great toe joint (Right); DDD (degenerative disc disease), lumbosacral;  Right calf pain; Cervicalgia; Cervical myofascial pain syndrome (Left); Occipital neuralgia (Left); DDD (degenerative disc disease), cervical (C5-6, C6-7); Grade 1 Anterolisthesis of C5/C6; Cervical facet arthropathy (Bilateral) (L>R); Cervical facet syndrome (Bilateral); TMJ arthralgia; Neck pain; Breast pain; Chronic hip pain (Right); Somatic dysfunction of sacroiliac joint (Right); Sacroiliac joint dysfunction (Right); Osteoarthritis of hip (Right); Impaired range of motion of hip (Right); Greater trochanteric bursitis (Right); and Bursitis of hip (Right) on their pertinent problem list. Pain Assessment: Severity of   is reported as a  /10. Location:    / . Onset:  . Quality:  . Timing:  . Modifying factor(s):  Marland Kitchen Vitals:  vitals were not taken for this visit.  BMI: Estimated body mass index is 22.79 kg/m as calculated from the following:   Height as of 02/06/22: 5' 2"$  (1.575 m).   Weight as of 02/06/22: 124 lb 9.6 oz (56.5 kg).  Reason for encounter: evaluation of worsening, or previously known (established) problem. ***  Pharmacotherapy Assessment  Analgesic: No chronic opioid analgesics therapy prescribed by our practice..   Monitoring: [place] PMP: PDMP reviewed during this encounter.       Pharmacotherapy: No side-effects or adverse reactions reported. Compliance: No problems identified. Effectiveness: Clinically acceptable.  No notes on file  No results found for: "CBDTHCR" No results found for: "D8THCCBX" No results found for: "D9THCCBX"  UDS:  No results found for: "SUMMARY"    ROS  Constitutional: Denies any fever or chills Gastrointestinal: No reported hemesis, hematochezia, vomiting, or acute GI distress Musculoskeletal: Denies any acute onset joint swelling, redness, loss of ROM, or weakness Neurological: No reported episodes of acute onset apraxia, aphasia, dysarthria, agnosia, amnesia, paralysis, loss of coordination, or loss of consciousness  Medication Review  ALPRAZolam,  EPINEPHrine, Estriol, SUMAtriptan, acetaminophen, cholecalciferol, famotidine, omeprazole, and ondansetron  History Review  Allergy: Shannon [name] is allergic to codeine, decongest-aid [  pseudoephedrine], flagyl [metronidazole], and sulfa antibiotics. Drug: Shannon [name]  reports no history of drug use. Alcohol:  reports no history of alcohol use. Tobacco:  reports that she quit smoking about 22 years ago. Her smoking use included cigarettes. She has never used smokeless tobacco. Social: Shannon [name]  reports that she quit smoking about 22 years ago. Her smoking use included cigarettes. She has never used smokeless tobacco. She reports that she does not drink alcohol and does not use drugs. Medical:  has a past medical history of Anemia, Anxiety, Breast pain, right (11/07/2012), Breathing difficulty (06/20/2014), Environmental allergies, GERD (gastroesophageal reflux disease), Hiatal hernia, History of migraine headaches, Hyperthyroidism, and Vaginitis (01/16/2013). Surgical: Shannon [name]  has a past surgical history that includes Septoplasty (5/90); Nasal sinus surgery (11/93); Cesarean section (11/98); Tubal ligation (02/1998); Umbilical hernia repair (02/1998); Cholecystectomy (11/05); Joint replacement (Left, 2017); and Breast biopsy (Right, 12/05/2015). Family: family history includes Breast cancer in an other family member; Breast cancer (age of onset: 22) in her mother; Heart disease in her father and maternal grandfather; Hypercholesterolemia in her father and mother; Hypertension in her father; Kidney cancer in her father; Leukemia in her father; Lung cancer in her paternal grandfather; Pancreatic cancer in her paternal grandfather; Prostate cancer in her paternal grandfather; Thyroid disease in her father and mother.  Laboratory Chemistry Profile   Renal Lab Results  Component Value Date   BUN 14 11/05/2021   CREATININE 0.61 11/05/2021   GFR 98.74 11/05/2021   GFRAA >60 05/02/2014   GFRNONAA >60  05/02/2014    Hepatic Lab Results  Component Value Date   AST 15 11/05/2021   ALT 14 11/05/2021   ALBUMIN 4.1 11/05/2021   ALKPHOS 64 11/05/2021   LIPASE 11.0 11/05/2021    Electrolytes Lab Results  Component Value Date   NA 138 11/05/2021   K 3.9 11/05/2021   CL 102 11/05/2021   CALCIUM 9.5 11/05/2021    Bone Lab Results  Component Value Date   VD25OH 21.22 (L) 04/02/2021   25OHVITD1 28 (L) 10/05/2017   25OHVITD2 <1.0 10/05/2017   25OHVITD3 28 10/05/2017    Inflammation (CRP: Acute Phase) (ESR: Chronic Phase) Lab Results  Component Value Date   CRP 2 10/05/2017   ESRSEDRATE 7 10/05/2017         Note: Above Lab results reviewed.  Recent Imaging Review  US SOFT TISSUE RT UPPER EXTREMITY LTD (NON-VASCULAR) CLINICAL DATA:  Pain and fullness in the right axilla for the past 2-3 weeks.  EXAM: ULTRASOUND RIGHT UPPER EXTREMITY LIMITED  TECHNIQUE: Ultrasound examination of the upper extremity soft tissues was performed in the area of clinical concern.  COMPARISON:  Right axillary ultrasound dated March 25, 2019.  FINDINGS: Focused ultrasound of the right axilla demonstrates no discrete soft tissue mass or fluid collection. No lymphadenopathy.  IMPRESSION: 1. Negative.  Electronically Signed   By: Titus Dubin M.D.   On: 02/18/2022 12:14 Note: Reviewed        Physical Exam  General appearance: Well nourished, well developed, and well hydrated. In no apparent acute distress Mental status: Alert, oriented x 3 (person, place, & time)       Respiratory: No evidence of acute respiratory distress Eyes: PERLA Vitals: LMP 02/12/2013  BMI: Estimated body mass index is 22.79 kg/m as calculated from the following:   Height as of 02/06/22: 5' 2"$  (1.575 m).   Weight as of 02/06/22: 124 lb 9.6 oz (56.5 kg). Ideal: Patient weight not recorded  Assessment  Diagnosis Status  1. Chronic low back pain (1ry area of Pain) (Bilateral) (R>L)   2. Chronic sacroiliac  joint pain (Right)   3. DDD (degenerative disc disease), lumbosacral   4. Lumbar Facet syndrome (Bilateral) (R>L)    Controlled Controlled Controlled   Updated Problems: No problems updated.  Plan of Care  Problem-specific:  No problem-specific Assessment & Plan notes found for this encounter.  Ms. BAREERAH AMRHEIN has a current medication list which includes the following long-term medication(s): famotidine and sumatriptan.  Pharmacotherapy (Medications Ordered): No orders of the defined types were placed in this encounter.  Orders:  No orders of the defined types were placed in this encounter.  Follow-up plan:   No follow-ups on file.      Interventional Therapies  Risk  Complexity Considerations:   Estimated body mass index is 22.31 kg/m as calculated from the following:   Height as of this encounter: 5' 2"$  (1.575 m).   Weight as of this encounter: 122 lb (55.3 kg). WNL   Planned  Pending:      Under consideration:   Possible right lumbar facet RFA #1  Possible right SI joint RFA #1  Possible left SI joint RFA #1  Therapeutic left cervical facet RFA #1    Completed:   Palliative right gluteal MNB  Palliative right SI joint Blk x5 (07/04/2021) (100/90/100/100)  Palliative left SI joint Blk x2 (09/28/2018) (100/100/100/90-100)  Palliative right L4-5 LESI x1 (12/10/2015) (100/100/100/100)  Palliative left L4-5 LESI x1 (01/11/2015) (100/100/95/95)  Therapeutic right trochanteric bursa inj. x1 (04/09/2021) (100/100/100/100)  Therapeutic right IA hip inj. x1 (04/09/2021) (100/100/100/100)  Palliative left IA hip injection x2 (06/14/2015) (100/100/80/80-90)  Palliative right lumbar facet MBB x3 (07/15/2018) (100/100/90/100)  Diagnostic left lumbar facet MBB x1 (04/13/2018) (100/100/100/100)  Diagnostic right great toe IA inj. x1  Palliative right IA small joint inj. 1st MTP (Dorsal Metatarsophalangeal ) x1 (10/15/2017) (75/75/75/50-75)  Diagnostic left cervical facet MBB  x1 (09/13/2019) (100/100/95/>75)    Therapeutic  Palliative (PRN) options:   Palliative right gluteal MNB Palliative SI joint block   Palliative L4-5 LESI   Palliative lumbar facet block   Diagnostic great toe IA injection   Palliative right IA small joint injection 1st MTP (Dorsal Metatarsophalangeal ) #2  Diagnostic cervical facet MBB        Recent Visits No visits were found meeting these conditions. Showing recent visits within past 90 days and meeting all other requirements Future Appointments Date Type Provider Dept  03/18/22 Appointment Milinda Pointer, MD [facility]  Showing future appointments within next 90 days and meeting all other requirements  I discussed the assessment and treatment plan with the patient. The patient was provided an opportunity to ask questions and all were answered. The patient agreed with the plan and demonstrated an understanding of the instructions.  Patient advised to call back or seek an in-person evaluation if the symptoms or condition worsens.  Duration of encounter: *** minutes.  Total time on encounter, as per AMA guidelines included both the face-to-face and non-face-to-face time personally spent by the physician and/or other qualified health care professional(s) on the day of the encounter (includes time in activities that require the physician or other qualified health care professional and does not include time in activities normally performed by clinical staff). Physician's time may include the following activities when performed: Preparing to see the patient (e.g., pre-charting review of records, searching for previously ordered imaging, lab work, and nerve conduction tests) Review of  prior analgesic pharmacotherapies. Reviewing PMP Interpreting ordered tests (e.g., lab work, imaging, nerve conduction tests) Performing post-procedure evaluations, including interpretation of diagnostic procedures Obtaining and/or reviewing  separately obtained history Performing a medically appropriate examination and/or evaluation Counseling and educating the patient/family/caregiver Ordering medications, tests, or procedures Referring and communicating with other health care professionals (when not separately reported) Documenting clinical information in the electronic or other health record Independently interpreting results (not separately reported) and communicating results to the patient/ family/caregiver Care coordination (not separately reported)  Note by: Gaspar Cola, MD Date: 03/18/2022; Time: 10:02 AM

## 2022-03-17 NOTE — Telephone Encounter (Signed)
Pt called back, she's scheduled to see Tomasita Morrow tomorrow 2/19 @3$ :40pm.

## 2022-03-18 ENCOUNTER — Ambulatory Visit: Payer: BC Managed Care – PPO | Admitting: Nurse Practitioner

## 2022-03-18 ENCOUNTER — Ambulatory Visit: Payer: BC Managed Care – PPO | Attending: Pain Medicine | Admitting: Pain Medicine

## 2022-03-18 ENCOUNTER — Encounter: Payer: Self-pay | Admitting: Nurse Practitioner

## 2022-03-18 ENCOUNTER — Encounter: Payer: Self-pay | Admitting: Pain Medicine

## 2022-03-18 VITALS — BP 118/78 | HR 87 | Temp 98.9°F | Ht 62.0 in | Wt 122.2 lb

## 2022-03-18 VITALS — BP 122/106 | HR 89 | Temp 98.1°F | Ht 62.0 in | Wt 124.0 lb

## 2022-03-18 DIAGNOSIS — J3089 Other allergic rhinitis: Secondary | ICD-10-CM | POA: Diagnosis not present

## 2022-03-18 DIAGNOSIS — N6459 Other signs and symptoms in breast: Secondary | ICD-10-CM | POA: Diagnosis not present

## 2022-03-18 DIAGNOSIS — M47817 Spondylosis without myelopathy or radiculopathy, lumbosacral region: Secondary | ICD-10-CM | POA: Insufficient documentation

## 2022-03-18 DIAGNOSIS — G8929 Other chronic pain: Secondary | ICD-10-CM | POA: Diagnosis not present

## 2022-03-18 DIAGNOSIS — M5137 Other intervertebral disc degeneration, lumbosacral region: Secondary | ICD-10-CM | POA: Insufficient documentation

## 2022-03-18 DIAGNOSIS — J301 Allergic rhinitis due to pollen: Secondary | ICD-10-CM | POA: Diagnosis not present

## 2022-03-18 DIAGNOSIS — M545 Low back pain, unspecified: Secondary | ICD-10-CM

## 2022-03-18 DIAGNOSIS — M47816 Spondylosis without myelopathy or radiculopathy, lumbar region: Secondary | ICD-10-CM | POA: Diagnosis not present

## 2022-03-18 DIAGNOSIS — J3081 Allergic rhinitis due to animal (cat) (dog) hair and dander: Secondary | ICD-10-CM | POA: Diagnosis not present

## 2022-03-18 NOTE — Patient Instructions (Signed)

## 2022-03-18 NOTE — Progress Notes (Signed)
Safety precautions to be maintained throughout the outpatient stay will include: orient to surroundings, keep bed in low position, maintain call bell within reach at all times, provide assistance with transfer out of bed and ambulation.  

## 2022-03-18 NOTE — Assessment & Plan Note (Signed)
Exam WNL. Advised patient to try OTC Lanolin nipple cream to see if she gets relief of symptoms. Encouraged to continue self breast exams. Advised to seek care if symptoms are changing or new symptoms develop such as nipple discharge, skin changes, redness or a lump/mass is felt.

## 2022-03-18 NOTE — Progress Notes (Signed)
Tomasita Morrow, NP-C Phone: [phone]  Shannon [name] is a 57 y.o. female who presents today for right nipple pain.  Patient reports her right nipple has been burning for the past 3 weeks. Reports the feeling as raw or excoriated. Denies pain. She has been using lotion and hydrocortisone cream with little relief. Reports mild improvement. The sensation waxes and wanes throughout the day everyday. Denies nipple discharge. Denies any rashes or lesions. She performs self breast exams and has not felt any lumps. Her mammogram is UTD.   Social History   Tobacco Use  Smoking Status Former   Types: Cigarettes   Quit date: 01/28/2000   Years since quitting: 22.1  Smokeless Tobacco Never    Current Outpatient Medications on File Prior to Visit  Medication Sig Dispense Refill   acetaminophen (TYLENOL) 500 MG tablet Take 1,000 mg by mouth every 6 (six) hours as needed for moderate pain.     ALPRAZolam (XANAX) 0.25 MG tablet TAKE ONE TABLET DAILY IF NEEDED FOR ANXIETY 30 tablet 0   cholecalciferol (VITAMIN D3) 25 MCG (1000 UNIT) tablet Take 1,000 Units by mouth daily.     EPINEPHrine 0.3 mg/0.3 mL IJ SOAJ injection      esomeprazole (NEXIUM) 20 MG capsule Take 20 mg by mouth daily at 12 noon.     esomeprazole (NEXIUM) 40 MG capsule Take 40 mg by mouth daily at 12 noon.     Estriol 10 % CREA by Does not apply route.     ondansetron (ZOFRAN ODT) 4 MG disintegrating tablet Take 1 tablet (4 mg total) by mouth 2 (two) times daily as needed for nausea or vomiting. 10 tablet 0   SUMAtriptan (IMITREX) 100 MG tablet      No current facility-administered medications on file prior to visit.     ROS see history of present illness  Objective  Physical Exam Vitals:   03/18/22 1527  BP: 118/78  Pulse: 87  Temp: 98.9 F (37.2 C)  SpO2: 97%    BP Readings from Last 3 Encounters:  03/18/22 118/78  03/18/22 (!) 122/106  02/06/22 118/80   Wt Readings from Last 3 Encounters:  03/18/22 122 lb 3.2  oz (55.4 kg)  03/18/22 124 lb (56.2 kg)  02/06/22 124 lb 9.6 oz (56.5 kg)    Physical Exam Exam conducted with a chaperone present Jeralyn Bennett, CMA).  Constitutional:      General: She is not in acute distress.    Appearance: Normal appearance.  HENT:     Head: Normocephalic.  Cardiovascular:     Rate and Rhythm: Normal rate and regular rhythm.     Heart sounds: Normal heart sounds.  Pulmonary:     Effort: Pulmonary effort is normal.     Breath sounds: Normal breath sounds.  Chest:  Breasts:    Right: Inverted nipple (normal for patient) present. No swelling, bleeding, mass, nipple discharge, skin change or tenderness.     Left: Normal.  Skin:    General: Skin is warm and dry.  Neurological:     General: No focal deficit present.     Mental Status: She is alert.  Psychiatric:        Mood and Affect: Mood normal.        Behavior: Behavior normal.    Assessment/Plan: Please see individual problem list.  Nipple problem Assessment & Plan: Exam WNL. Advised patient to try OTC Lanolin nipple cream to see if she gets relief of symptoms. Encouraged to continue self  breast exams. Advised to seek care if symptoms are changing or new symptoms develop such as nipple discharge, skin changes, redness or a lump/mass is felt.     Return if symptoms worsen or fail to improve.   Tomasita Morrow, NP-C [place]

## 2022-03-25 ENCOUNTER — Ambulatory Visit: Payer: BC Managed Care – PPO | Attending: Pain Medicine | Admitting: Pain Medicine

## 2022-03-25 ENCOUNTER — Ambulatory Visit
Admission: RE | Admit: 2022-03-25 | Discharge: 2022-03-25 | Disposition: A | Discharge status: Home / Self Care | Payer: BC Managed Care – PPO | Source: Ambulatory Visit | Attending: Pain Medicine | Admitting: Pain Medicine

## 2022-03-25 ENCOUNTER — Encounter: Payer: Self-pay | Admitting: Pain Medicine

## 2022-03-25 VITALS — BP 149/80 | HR 80 | Temp 97.9°F | Resp 16 | Ht 62.0 in | Wt 124.0 lb

## 2022-03-25 DIAGNOSIS — M47817 Spondylosis without myelopathy or radiculopathy, lumbosacral region: Secondary | ICD-10-CM | POA: Diagnosis not present

## 2022-03-25 DIAGNOSIS — G8929 Other chronic pain: Secondary | ICD-10-CM | POA: Insufficient documentation

## 2022-03-25 DIAGNOSIS — M5137 Other intervertebral disc degeneration, lumbosacral region: Secondary | ICD-10-CM | POA: Diagnosis not present

## 2022-03-25 DIAGNOSIS — M47816 Spondylosis without myelopathy or radiculopathy, lumbar region: Secondary | ICD-10-CM | POA: Diagnosis not present

## 2022-03-25 DIAGNOSIS — M545 Low back pain, unspecified: Secondary | ICD-10-CM | POA: Diagnosis not present

## 2022-03-25 MED ORDER — ROPIVACAINE HCL 2 MG/ML IJ SOLN
INTRAMUSCULAR | Status: AC
  Filled 2022-03-25: qty 20

## 2022-03-25 MED ORDER — LIDOCAINE HCL 2 % IJ SOLN
20.0000 mL | Freq: Once | INTRAMUSCULAR | Status: AC
  Administered 2022-03-25: 400 mg

## 2022-03-25 MED ORDER — LIDOCAINE HCL 2 % IJ SOLN
INTRAMUSCULAR | Status: AC
  Filled 2022-03-25: qty 20

## 2022-03-25 MED ORDER — PENTAFLUOROPROP-TETRAFLUOROETH EX AERO
INHALATION_SPRAY | Freq: Once | CUTANEOUS | Status: AC
  Administered 2022-03-25: 30 via TOPICAL
  Filled 2022-03-25: qty 116

## 2022-03-25 MED ORDER — ROPIVACAINE HCL 2 MG/ML IJ SOLN
9.0000 mL | Freq: Once | INTRAMUSCULAR | Status: AC
  Administered 2022-03-25: 9 mL via PERINEURAL

## 2022-03-25 MED ORDER — TRIAMCINOLONE ACETONIDE 40 MG/ML IJ SUSP
INTRAMUSCULAR | Status: AC
  Filled 2022-03-25: qty 1

## 2022-03-25 MED ORDER — TRIAMCINOLONE ACETONIDE 40 MG/ML IJ SUSP
40.0000 mg | Freq: Once | INTRAMUSCULAR | Status: AC
  Administered 2022-03-25: 40 mg

## 2022-03-25 NOTE — Patient Instructions (Addendum)
  ____________________________________________________________________________________________  Patient Information update  To: All of our patients.  Re: Name change.  It has been made official that our current name, "[facility]"   will soon be changed to "[facility]".   The purpose of this change is to eliminate any confusion created by the concept of our practice being a "Medication Management Pain Clinic". In the past this has led to the misconception that we treat pain primarily by the use of prescription medications.  Nothing can be farther from the truth.   Understanding PAIN MANAGEMENT: To further understand what our practice does, you first have to understand that "Pain Management" is a subspecialty that requires additional training once a physician has completed their specialty training, which can be in either Anesthesia, Neurology, Psychiatry, or Physical Medicine and Rehabilitation (PMR). Each one of these contributes to the final approach taken by each physician to the management of their patient's pain. To be a "Pain Management Specialist" you must have first completed one of the specialty trainings below.  Anesthesiologists - trained in clinical pharmacology and interventional techniques such as nerve blockade and regional as well as central neuroanatomy. They are trained to block pain before, during, and after surgical interventions.  Neurologists - trained in the diagnosis and pharmacological treatment of complex neurological conditions, such as Multiple Sclerosis, Parkinson's, spinal cord injuries, and other systemic conditions that may be associated with symptoms that may include but are not limited to pain. They tend to rely primarily on the treatment of chronic pain using prescription medications.  Psychiatrist - trained in conditions affecting the psychosocial  wellbeing of patients including but not limited to depression, anxiety, schizophrenia, personality disorders, addiction, and other substance use disorders that may be associated with chronic pain. They tend to rely primarily on the treatment of chronic pain using prescription medications.   Physical Medicine and Rehabilitation (PMR) physicians, also known as physiatrists - trained to treat a wide variety of medical conditions affecting the brain, spinal cord, nerves, bones, joints, ligaments, muscles, and tendons. Their training is primarily aimed at treating patients that have suffered injuries that have caused severe physical impairment. Their training is primarily aimed at the physical therapy and rehabilitation of those patients. They may also work alongside orthopedic surgeons or neurosurgeons using their expertise in assisting surgical patients to recover after their surgeries.  INTERVENTIONAL PAIN MANAGEMENT is sub-subspecialty of Pain Management.  Our physicians are Board-certified in Anesthesia, Pain Management, and Interventional Pain Management.  This meaning that not only have they been trained and Board-certified in their specialty of Anesthesia, and subspecialty of Pain Management, but they have also received further training in the sub-subspecialty of Interventional Pain Management, in order to become Board-certified as INTERVENTIONAL PAIN MANAGEMENT SPECIALIST.    Mission: Our goal is to use our skills in  INTERVENTIONAL PAIN MANAGEMENT as alternatives to the chronic use of prescription opioid medications for the treatment of pain. To make this more clear, we have changed our name to reflect what we do and offer. We will continue to offer medication management assessment and recommendations, but we will not be taking over any patient's medication management.  ____________________________________________________________________________________________       cheeks); mood swings; menstrual changes. Uncommon: Long-term decrease or suppression of natural hormones; bone thinning. (These are more common with higher doses or more frequent use. This is why we prefer that our patients avoid having any injection therapies in other practices.)  Very Rare: Severe mood changes; psychosis; aseptic necrosis. From procedure: Some discomfort is to be expected once  the numbing medicine wears off. This should be minimal if ice and heat are applied as instructed.  Call if: (When should I call?) You experience numbness and weakness that gets worse with time, as opposed to wearing off. New onset bowel or bladder incontinence. (Applies only to procedures done in the spine)  Emergency Numbers: Durning business hours (Monday - Thursday, 8:00 AM - 4:00 PM) (Friday, 9:00 AM - 12:00 Noon): [phone] After hours: [phone] NOTE: If you are having a problem and are unable connect with, or to talk to a provider, then go to your nearest urgent care or emergency department. If the problem is serious and urgent, please call 911. ____________________________________________________________________________________________

## 2022-03-25 NOTE — Progress Notes (Signed)
PROVIDER NOTE: Interpretation of information contained herein should be left to medically-trained personnel. Specific patient instructions are provided elsewhere under "Patient Instructions" section of medical record. This document was created in part using STT-dictation technology, any transcriptional errors that may result from this process are unintentional.  Patient: Shannon [name] Type: Established DOB: 11/05/1965 MRN: [id number] PCP: [name], MD  Service: Procedure DOS: 03/25/2022 Setting: Ambulatory Location: Ambulatory outpatient facility Delivery: Face-to-face Provider: Gaspar Cola, MD Specialty: Interventional Pain Management Specialty designation: 09 Location: Outpatient facility Ref. Prov.: Milinda Pointer, MD       Interventional Therapy   Procedure: Lumbar Facet, Medial Branch Block(s) #2  Laterality: Left  Level: L3, L4, L5, and S1 Medial Branch Level(s). Injecting these levels blocks the L4-5 and L5-S1 lumbar facet joints. Imaging: Fluoroscopic guidance         Anesthesia: Local anesthesia (1-2% Lidocaine) Anxiolysis: None                 Sedation: No Sedation                       DOS: 03/25/2022 Performed by: Gaspar Cola, MD  Primary Purpose: Diagnostic/Therapeutic Indications: Low back pain severe enough to impact quality of life or function. 1. Lumbar Facet syndrome (Bilateral) (R>L)   2. Spondylosis without myelopathy or radiculopathy, lumbosacral region   3. Lumbar facet hypertrophy   4. Chronic low back pain (1ry area of Pain) (Left) w/o sciatica    NAS-11 Pain score:   Pre-procedure: 3 /10   Post-procedure: 0-No pain/10     Position / Prep / Materials:  Position: Prone  Prep solution: DuraPrep (Iodine Povacrylex [0.7% available iodine] and Isopropyl Alcohol, 74% w/w) Area Prepped: Posterolateral Lumbosacral Spine (Wide prep: From the lower border of the scapula down to the end of the tailbone and from flank to  flank.)  Materials:  Tray: Block Needle(s):  Type: Spinal  Gauge (G): 22  Length: 3.5-in Qty: 4      Pre-op H&P Assessment:  Shannon [name] is a 57 y.o. (year old), female patient, seen today for interventional treatment. She  has a past surgical history that includes Septoplasty (5/90); Nasal sinus surgery (11/93); Cesarean section (11/98); Tubal ligation (02/1998); Umbilical hernia repair (02/1998); Cholecystectomy (11/05); Joint replacement (Left, 2017); and Breast biopsy (Right, 12/05/2015). Shannon [name] has a current medication list which includes the following prescription(s): acetaminophen, alprazolam, cholecalciferol, epinephrine, esomeprazole, esomeprazole, estriol, ondansetron, and sumatriptan. Her primarily concern today is the Back Pain (lower)  Initial Vital Signs:  Pulse/HCG Rate: 75  Temp: 97.9 F (36.6 C) Resp: 16 BP: 134/85 SpO2: 99 %  BMI: Estimated body mass index is 22.68 kg/m as calculated from the following:   Height as of this encounter: '5\' 2"'$  (1.575 m).   Weight as of this encounter: 124 lb (56.2 kg).  Risk Assessment: Allergies: Reviewed. She is allergic to codeine, decongest-aid [pseudoephedrine], flagyl [metronidazole], and sulfa antibiotics.  Allergy Precautions: None required Coagulopathies: Reviewed. None identified.  Blood-thinner therapy: None at this time Active Infection(s): Reviewed. None identified. Shannon [name] is afebrile  Site Confirmation: Shannon [name] was asked to confirm the procedure and laterality before marking the site Procedure checklist: Completed Consent: Before the procedure and under the influence of no sedative(s), amnesic(s), or anxiolytics, the patient was informed of the treatment options, risks and possible complications. To fulfill our ethical and legal obligations, as recommended by the American Medical Association's Code of Ethics, I have informed the patient  of my clinical impression; the nature and purpose of the treatment or procedure;  the risks, benefits, and possible complications of the intervention; the alternatives, including doing nothing; the risk(s) and benefit(s) of the alternative treatment(s) or procedure(s); and the risk(s) and benefit(s) of doing nothing. The patient was provided information about the general risks and possible complications associated with the procedure. These may include, but are not limited to: failure to achieve desired goals, infection, bleeding, organ or nerve damage, allergic reactions, paralysis, and death. In addition, the patient was informed of those risks and complications associated to Spine-related procedures, such as failure to decrease pain; infection (i.e.: Meningitis, epidural or intraspinal abscess); bleeding (i.e.: epidural hematoma, subarachnoid hemorrhage, or any other type of intraspinal or peri-dural bleeding); organ or nerve damage (i.e.: Any type of peripheral nerve, nerve root, or spinal cord injury) with subsequent damage to sensory, motor, and/or autonomic systems, resulting in permanent pain, numbness, and/or weakness of one or several areas of the body; allergic reactions; (i.e.: anaphylactic reaction); and/or death. Furthermore, the patient was informed of those risks and complications associated with the medications. These include, but are not limited to: allergic reactions (i.e.: anaphylactic or anaphylactoid reaction(s)); adrenal axis suppression; blood sugar elevation that in diabetics may result in ketoacidosis or comma; water retention that in patients with history of congestive heart failure may result in shortness of breath, pulmonary edema, and decompensation with resultant heart failure; weight gain; swelling or edema; medication-induced neural toxicity; particulate matter embolism and blood vessel occlusion with resultant organ, and/or nervous system infarction; and/or aseptic necrosis of one or more joints. Finally, the patient was informed that Medicine is not an exact  science; therefore, there is also the possibility of unforeseen or unpredictable risks and/or possible complications that may result in a catastrophic outcome. The patient indicated having understood very clearly. We have given the patient no guarantees and we have made no promises. Enough time was given to the patient to ask questions, all of which were answered to the patient's satisfaction. Ms. Carli has indicated that she wanted to continue with the procedure. Attestation: I, the ordering provider, attest that I have discussed with the patient the benefits, risks, side-effects, alternatives, likelihood of achieving goals, and potential problems during recovery for the procedure that I have provided informed consent. Date  Time: 03/25/2022 11:23 AM   Pre-Procedure Preparation:  Monitoring: As per clinic protocol. Respiration, ETCO2, SpO2, BP, heart rate and rhythm monitor placed and checked for adequate function Safety Precautions: Patient was assessed for positional comfort and pressure points before starting the procedure. Time-out: I initiated and conducted the "Time-out" before starting the procedure, as per protocol. The patient was asked to participate by confirming the accuracy of the "Time Out" information. Verification of the correct person, site, and procedure were performed and confirmed by me, the nursing staff, and the patient. "Time-out" conducted as per Joint Commission's Universal Protocol (UP.01.01.01). Time: 1134  Description of Procedure:          Laterality: (see above) Targeted Levels: (see above)  Safety Precautions: Aspiration looking for blood return was conducted prior to all injections. At no point did we inject any substances, as a needle was being advanced. Before injecting, the patient was told to immediately notify me if she was experiencing any new onset of "ringing in the ears, or metallic taste in the mouth". No attempts were made at seeking any paresthesias. Safe  injection practices and needle disposal techniques used. Medications properly checked for expiration dates.  SDV (single dose vial) medications used. After the completion of the procedure, all disposable equipment used was discarded in the proper designated medical waste containers. Local Anesthesia: Protocol guidelines were followed. The patient was positioned over the fluoroscopy table. The area was prepped in the usual manner. The time-out was completed. The target area was identified using fluoroscopy. A 12-in long, straight, sterile hemostat was used with fluoroscopic guidance to locate the targets for each level blocked. Once located, the skin was marked with an approved surgical skin marker. Once all sites were marked, the skin (epidermis, dermis, and hypodermis), as well as deeper tissues (fat, connective tissue and muscle) were infiltrated with a small amount of a short-acting local anesthetic, loaded on a 10cc syringe with a 25G, 1.5-in  Needle. An appropriate amount of time was allowed for local anesthetics to take effect before proceeding to the next step. Local Anesthetic: Lidocaine 2.0% The unused portion of the local anesthetic was discarded in the proper designated containers. Technical description of process:   L3 Medial Branch Nerve Block (MBB): The target area for the L3 medial branch is at the junction of the postero-lateral aspect of the superior articular process and the superior, posterior, and medial edge of the transverse process of L4. Under fluoroscopic guidance, a Quincke needle was inserted until contact was made with os over the superior postero-lateral aspect of the pedicular shadow (target area). After negative aspiration for blood, 0.5 mL of the nerve block solution was injected without difficulty or complication. The needle was removed intact. L4 Medial Branch Nerve Block (MBB): The target area for the L4 medial branch is at the junction of the postero-lateral aspect of the  superior articular process and the superior, posterior, and medial edge of the transverse process of L5. Under fluoroscopic guidance, a Quincke needle was inserted until contact was made with os over the superior postero-lateral aspect of the pedicular shadow (target area). After negative aspiration for blood, 0.5 mL of the nerve block solution was injected without difficulty or complication. The needle was removed intact. L5 Medial Branch Nerve Block (MBB): The target area for the L5 medial branch is at the junction of the postero-lateral aspect of the superior articular process and the superior, posterior, and medial edge of the sacral ala. Under fluoroscopic guidance, a Quincke needle was inserted until contact was made with os over the superior postero-lateral aspect of the pedicular shadow (target area). After negative aspiration for blood, 0.5 mL of the nerve block solution was injected without difficulty or complication. The needle was removed intact. S1 Medial Branch Nerve Block (MBB): The target area for the S1 medial branch is at the posterior and inferior 6 o'clock position of the L5-S1 facet joint. Under fluoroscopic guidance, the Quincke needle inserted for the L5 MBB was redirected until contact was made with os over the inferior and postero aspect of the sacrum, at the 6 o' clock position under the L5-S1 facet joint (Target area). After negative aspiration for blood, 0.5 mL of the nerve block solution was injected without difficulty or complication. The needle was removed intact.  Once the entire procedure was completed, the treated area was cleaned, making sure to leave some of the prepping solution back to take advantage of its long term bactericidal properties.         Illustration of the posterior view of the lumbar spine and the posterior neural structures. Laminae of L2 through S1 are labeled. DPRL5, dorsal primary ramus of L5; DPRS1, dorsal primary ramus  of S1; DPR3, dorsal primary  ramus of L3; FJ, facet (zygapophyseal) joint L3-L4; I, inferior articular process of L4; LB1, lateral branch of dorsal primary ramus of L1; IAB, inferior articular branches from L3 medial branch (supplies L4-L5 facet joint); IBP, intermediate branch plexus; MB3, medial branch of dorsal primary ramus of L3; NR3, third lumbar nerve root; S, superior articular process of L5; SAB, superior articular branches from L4 (supplies L4-5 facet joint also); TP3, transverse process of L3.  Vitals:   03/25/22 1121 03/25/22 1131 03/25/22 1141  BP: 134/85 (!) 141/82 (!) 149/80  Pulse: 75 78 80  Resp: '16 18 16  '$ Temp: 97.9 F (36.6 C)    TempSrc: Temporal    SpO2: 99% 99% 100%  Weight: 124 lb (56.2 kg)    Height: '5\' 2"'$  (1.575 m)       Start Time: 1134 hrs. End Time: 1139 hrs.  Imaging Guidance (Spinal):          Type of Imaging Technique: Fluoroscopy Guidance (Spinal) Indication(s): Assistance in needle guidance and placement for procedures requiring needle placement in or near specific anatomical locations not easily accessible without such assistance. Exposure Time: Please see nurses notes. Contrast: None used. Fluoroscopic Guidance: I was personally present during the use of fluoroscopy. "Tunnel Vision Technique" used to obtain the best possible view of the target area. Parallax error corrected before commencing the procedure. "Direction-depth-direction" technique used to introduce the needle under continuous pulsed fluoroscopy. Once target was reached, antero-posterior, oblique, and lateral fluoroscopic projection used confirm needle placement in all planes. Images permanently stored in EMR.  Described upper level of pain area.         Interpretation: No contrast injected. I personally interpreted the imaging intraoperatively. Adequate needle placement confirmed in multiple planes. Permanent images saved into the patient's record.  Post-operative Assessment:  Post-procedure Vital Signs:   Pulse/HCG Rate: 80  Temp: 97.9 F (36.6 C) Resp: 16 BP: (!) 149/80 SpO2: 100 %  EBL: None  Complications: No immediate post-treatment complications observed by team, or reported by patient.  Note: The patient tolerated the entire procedure well. A repeat set of vitals were taken after the procedure and the patient was kept under observation following institutional policy, for this type of procedure. Post-procedural neurological assessment was performed, showing return to baseline, prior to discharge. The patient was provided with post-procedure discharge instructions, including a section on how to identify potential problems. Should any problems arise concerning this procedure, the patient was given instructions to immediately contact us, at any time, without hesitation. In any case, we plan to contact the patient by telephone for a follow-up status report regarding this interventional procedure.  Comments:  No additional relevant information.  Plan of Care (POC)  Orders:  Orders Placed This Encounter  Procedures   LUMBAR FACET(MEDIAL BRANCH NERVE BLOCK) MBNB    Scheduling Instructions:     Procedure: Lumbar facet block (AKA.: Lumbosacral medial branch nerve block)     Side: Left-sided     Level: L3-4, L4-5, and L5-S1 Facets (L2, L3, L4, L5, and S1 Medial Branch Nerves)     Sedation: Patient's choice.     Timeframe: Today    Order Specific Question:   Where will this procedure be performed?    Answer:   [facility]   DG PA[facility] C-ARM 1-60 MIN NO REPORT    Intraoperative interpretation by procedural physician at [place].    Standing Status:   Standing    Number  of Occurrences:   1    Order Specific Question:   Reason for exam:    Answer:   Assistance in needle guidance and placement for procedures requiring needle placement in or near specific anatomical locations not easily accessible without such assistance.   Informed Consent Details:  Physician/Practitioner Attestation; Transcribe to consent form and obtain patient signature    Nursing Order: Transcribe to consent form and obtain patient signature. Note: Always confirm laterality of pain with Ms. Durene Fruits, before procedure.    Order Specific Question:   Physician/Practitioner attestation of informed consent for procedure/surgical case    Answer:   I, the physician/practitioner, attest that I have discussed with the patient the benefits, risks, side effects, alternatives, likelihood of achieving goals and potential problems during recovery for the procedure that I have provided informed consent.    Order Specific Question:   Procedure    Answer:   Lumbar Facet Block  under fluoroscopic guidance    Order Specific Question:   Physician/Practitioner performing the procedure    Answer:   Agustin Swatek A. Dossie Arbour MD    Order Specific Question:   Indication/Reason    Answer:   Low Back Pain, with our without leg pain, due to Facet Joint Arthralgia (Joint Pain) Spondylosis (Arthritis of the Spine), without myelopathy or radiculopathy (Nerve Damage).   Provide equipment / supplies at bedside    Procedure tray: "Block Tray" (Disposable  single use) Skin infiltration needle: Regular 1.5-in, 25-G, (x1) Block Needle type: Spinal Amount/quantity: 4 Size: Regular (3.5-inch) Gauge: 22G    Standing Status:   Standing    Number of Occurrences:   1    Order Specific Question:   Specify    Answer:   Block Tray   Chronic Opioid Analgesic:  No chronic opioid analgesics therapy prescribed by our practice..   Medications ordered for procedure: Meds ordered this encounter  Medications   lidocaine (XYLOCAINE) 2 % (with pres) injection 400 mg   pentafluoroprop-tetrafluoroeth (GEBAUERS) aerosol   ropivacaine (PF) 2 mg/mL (0.2%) (NAROPIN) injection 9 mL   triamcinolone acetonide (KENALOG-40) injection 40 mg   Medications administered: We administered lidocaine, pentafluoroprop-tetrafluoroeth,  ropivacaine (PF) 2 mg/mL (0.2%), and triamcinolone acetonide.  See the medical record for exact dosing, route, and time of administration.  Follow-up plan:   Return in about 2 weeks (around 04/08/2022) for Proc-day (T,Th), (PPE), (VV).       Interventional Therapies  Risk  Complexity Considerations:   WNL   Planned  Pending:   Diagnostic left lumbar facet MBB #2    Under consideration:   Possible right lumbar facet RFA #1  Possible right SI joint RFA #1  Possible left SI joint RFA #1  Therapeutic left cervical facet RFA #1    Completed:   Palliative right gluteal MNB  Palliative right SI joint Blk x5 (07/04/2021) (100/90/100/100)  Palliative left SI joint Blk x2 (09/28/2018) (100/100/100/90-100)  Palliative right L4-5 LESI x1 (12/10/2015) (100/100/100/100)  Palliative left L4-5 LESI x1 (01/11/2015) (100/100/95/95)  Therapeutic right trochanteric bursa inj. x1 (04/09/2021) (100/100/100/100)  Therapeutic right IA hip inj. x1 (04/09/2021) (100/100/100/100)  Palliative left IA hip injection x2 (06/14/2015) (100/100/80/80-90)  Palliative right lumbar facet MBB x3 (07/15/2018) (100/100/90/100)  Diagnostic left lumbar facet MBB x1 (04/13/2018) (100/100/100/100)  Diagnostic right great toe IA inj. x1  Palliative right IA small joint inj. 1st MTP (Dorsal Metatarsophalangeal ) x1 (10/15/2017) (75/75/75/50-75)  Diagnostic left cervical facet MBB x1 (09/13/2019) (100/100/95/>75)    Therapeutic  Palliative (PRN) options:  Palliative right gluteal MNB Palliative SI joint block   Palliative L4-5 LESI   Palliative lumbar facet block   Diagnostic great toe IA injection   Palliative right IA small joint injection 1st MTP (Dorsal Metatarsophalangeal ) #2  Diagnostic cervical facet MBB         Recent Visits Date Type Provider Dept  03/18/22 Office Visit Milinda Pointer, MD [facility]  Showing recent visits within past 90 days and meeting all other requirements Today's  Visits Date Type Provider Dept  03/25/22 Procedure visit Milinda Pointer, MD [facility]  Showing today's visits and meeting all other requirements Future Appointments Date Type Provider Dept  04/08/22 Appointment Milinda Pointer, MD [facility]  Showing future appointments within next 90 days and meeting all other requirements  Disposition: Discharge home  Discharge (Date  Time): 03/25/2022; 1147 hrs.   Primary Care Physician: [name], MD Location: [facility] Note by: Gaspar Cola, MD (TTS technology used. I apologize for any typographical errors that were not detected and corrected.) Date: 03/25/2022; Time: 12:11 PM  Disclaimer:  Medicine is not an Chief Strategy Officer. The only guarantee in medicine is that nothing is guaranteed. It is important to note that the decision to proceed with this intervention was based on the information collected from the patient. The Data and conclusions were drawn from the patient's questionnaire, the interview, and the physical examination. Because the information was provided in large part by the patient, it cannot be guaranteed that it has not been purposely or unconsciously manipulated. Every effort has been made to obtain as much relevant data as possible for this evaluation. It is important to note that the conclusions that lead to this procedure are derived in large part from the available data. Always take into account that the treatment will also be dependent on availability of resources and existing treatment guidelines, considered by other Pain Management Practitioners as being common knowledge and practice, at the time of the intervention. For Medico-Legal purposes, it is also important to point out that variation in procedural techniques and pharmacological choices are the acceptable norm. The indications, contraindications, technique, and results of the above procedure should only be  interpreted and judged by a Board-Certified Interventional Pain Specialist with extensive familiarity and expertise in the same exact procedure and technique.

## 2022-03-26 ENCOUNTER — Telehealth: Payer: Self-pay | Admitting: *Deleted

## 2022-03-26 NOTE — Telephone Encounter (Signed)
No problems post procedure. 

## 2022-03-27 ENCOUNTER — Ambulatory Visit (INDEPENDENT_AMBULATORY_CARE_PROVIDER_SITE_OTHER): Payer: BC Managed Care – PPO

## 2022-03-27 DIAGNOSIS — J3081 Allergic rhinitis due to animal (cat) (dog) hair and dander: Secondary | ICD-10-CM | POA: Diagnosis not present

## 2022-03-27 DIAGNOSIS — E538 Deficiency of other specified B group vitamins: Secondary | ICD-10-CM | POA: Diagnosis not present

## 2022-03-27 DIAGNOSIS — J3089 Other allergic rhinitis: Secondary | ICD-10-CM | POA: Diagnosis not present

## 2022-03-27 DIAGNOSIS — J301 Allergic rhinitis due to pollen: Secondary | ICD-10-CM | POA: Diagnosis not present

## 2022-03-27 MED ORDER — CYANOCOBALAMIN 1000 MCG/ML IJ SOLN
1000.0000 ug | Freq: Once | INTRAMUSCULAR | Status: AC
  Administered 2022-03-27: 1000 ug via INTRAMUSCULAR

## 2022-03-27 NOTE — Progress Notes (Signed)
Patient presented for B 12 injection to right deltoid, patient voiced no concerns nor showed any signs of distress during injection. 

## 2022-04-03 DIAGNOSIS — J3089 Other allergic rhinitis: Secondary | ICD-10-CM | POA: Diagnosis not present

## 2022-04-03 DIAGNOSIS — J301 Allergic rhinitis due to pollen: Secondary | ICD-10-CM | POA: Diagnosis not present

## 2022-04-08 ENCOUNTER — Ambulatory Visit: Payer: BC Managed Care – PPO | Admitting: Pain Medicine

## 2022-04-08 DIAGNOSIS — J3089 Other allergic rhinitis: Secondary | ICD-10-CM | POA: Diagnosis not present

## 2022-04-08 DIAGNOSIS — J301 Allergic rhinitis due to pollen: Secondary | ICD-10-CM | POA: Diagnosis not present

## 2022-04-08 DIAGNOSIS — J3081 Allergic rhinitis due to animal (cat) (dog) hair and dander: Secondary | ICD-10-CM | POA: Diagnosis not present

## 2022-04-10 ENCOUNTER — Ambulatory Visit: Payer: BC Managed Care – PPO | Attending: Pain Medicine | Admitting: Pain Medicine

## 2022-04-10 DIAGNOSIS — M47817 Spondylosis without myelopathy or radiculopathy, lumbosacral region: Secondary | ICD-10-CM | POA: Diagnosis not present

## 2022-04-10 DIAGNOSIS — M47816 Spondylosis without myelopathy or radiculopathy, lumbar region: Secondary | ICD-10-CM

## 2022-04-10 DIAGNOSIS — G8929 Other chronic pain: Secondary | ICD-10-CM

## 2022-04-10 DIAGNOSIS — M545 Low back pain, unspecified: Secondary | ICD-10-CM

## 2022-04-10 NOTE — Progress Notes (Signed)
Patient: Shannon [name]  Service Category: E/M  Provider: Gaspar Cola, MD  DOB: Jun 18, 1965  DOS: 04/10/2022  Location: Office  MRN: [id number]  Setting: Ambulatory outpatient  Referring Provider: [name], MD  Type: Established Patient  Specialty: Interventional Pain Management  PCP: [name], MD  Location: Remote location  Delivery: TeleHealth     Virtual Encounter - Pain Management PROVIDER NOTE: Information contained herein reflects review and annotations entered in association with encounter. Interpretation of such information and data should be left to medically-trained personnel. Information provided to patient can be located elsewhere in the medical record under "Patient Instructions". Document created using STT-dictation technology, any transcriptional errors that may result from process are unintentional.    Contact & Pharmacy Preferred: [phone] Home: [phone] (home) Mobile: [phone] (mobile) E-mail: [email or web address]  [place], [place] - [address] [place] [address] Phone: [phone] Fax: [phone]   Pre-screening  Shannon [name] offered "in-person" vs "virtual" encounter. She indicated preferring virtual for this encounter.   Reason COVID-19*  Social distancing based on CDC and AMA recommendations.   I contacted Shannon [name] on 04/10/2022 via telephone.      I clearly identified myself as Gaspar Cola, MD. I verified that I was speaking with the correct person using two identifiers (Name: Shannon [name], and date of birth: 16-Jun-1965).  Consent I sought verbal advanced consent from Shannon [name] for virtual visit interactions. I informed Shannon [name] of possible security and privacy concerns, risks, and limitations associated with providing "not-in-person" medical evaluation and management services. I also informed Shannon [name] of the availability of "in-person" appointments. Finally, I informed  her that there would be a charge for the virtual visit and that she could be  personally, fully or partially, financially responsible for it. Shannon [name] expressed understanding and agreed to proceed.   Historic Elements   Shannon [name] is a 57 y.o. year old, female patient evaluated today after our last contact on 03/25/2022. Shannon [name]  has a past medical history of Anemia, Anxiety, Breast pain, right (11/07/2012), Breathing difficulty (06/20/2014), Environmental allergies, GERD (gastroesophageal reflux disease), Hiatal hernia, History of migraine headaches, Hyperthyroidism, and Vaginitis (01/16/2013). She also  has a past surgical history that includes Septoplasty (5/90); Nasal sinus surgery (11/93); Cesarean section (11/98); Tubal ligation (02/1998); Umbilical hernia repair (02/1998); Cholecystectomy (11/05); Joint replacement (Left, 2017); and Breast biopsy (Right, 12/05/2015). Shannon [name] has a current medication list which includes the following prescription(s): acetaminophen, alprazolam, cholecalciferol, epinephrine, esomeprazole, esomeprazole, estriol, ondansetron, and sumatriptan. She  reports that she quit smoking about 22 years ago. Her smoking use included cigarettes. She has never used smokeless tobacco. She reports that she does not drink alcohol and does not use drugs. Shannon [name] is allergic to codeine, decongest-aid [pseudoephedrine], flagyl [metronidazole], and sulfa antibiotics.  BMI: Estimated body mass index is 22.68 kg/m as calculated from the following:   Height as of 03/25/22: '5\' 2"'$  (1.575 m).   Weight as of 03/25/22: 124 lb (56.2 kg). Last encounter: 03/18/2022. Last procedure: 03/25/2022.  HPI  Today, she is being contacted for a post-procedure assessment.  Post-procedure evaluation   Procedure: Lumbar Facet, Medial Branch Block(s) #2  Laterality: Left  Level: L3, L4, L5, and S1 Medial Branch Level(s). Injecting these levels blocks the L4-5 and L5-S1 lumbar facet joints. Imaging:  Fluoroscopic guidance         Anesthesia: Local anesthesia (1-2% Lidocaine) Anxiolysis: None  Sedation: No Sedation                       DOS: 03/25/2022 Performed by: Gaspar Cola, MD  Primary Purpose: Diagnostic/Therapeutic Indications: Low back pain severe enough to impact quality of life or function. 1. Lumbar Facet syndrome (Bilateral) (R>L)   2. Spondylosis without myelopathy or radiculopathy, lumbosacral region   3. Lumbar facet hypertrophy   4. Chronic low back pain (1ry area of Pain) (Left) w/o sciatica    NAS-11 Pain score:   Pre-procedure: 3 /10   Post-procedure: 0-No pain/10      Effectiveness:  Initial hour after procedure: 100 %. Subsequent 4-6 hours post-procedure: 100 %. Analgesia past initial 6 hours: 100 % (1st and 2nd morning pain score of 1.  day 6 increased pain and currently no pain.). Ongoing improvement:  Analgesic: The patient indicates having an ongoing 100% relief of her pain. Function: Shannon [name] reports improvement in function ROM: Shannon [name] reports improvement in ROM  Pharmacotherapy Assessment   Opioid Analgesic: No chronic opioid analgesics therapy prescribed by our practice..   Monitoring: [place] PMP: PDMP reviewed during this encounter.       Pharmacotherapy: No side-effects or adverse reactions reported. Compliance: No problems identified. Effectiveness: Clinically acceptable. Plan: Refer to "POC". UDS: No results found for: "SUMMARY" No results found for: "CBDTHCR", "D8THCCBX", "D9THCCBX"   Laboratory Chemistry Profile   Renal Lab Results  Component Value Date   BUN 14 11/05/2021   CREATININE 0.61 11/05/2021   GFR 98.74 11/05/2021   GFRAA >60 05/02/2014   GFRNONAA >60 05/02/2014    Hepatic Lab Results  Component Value Date   AST 15 11/05/2021   ALT 14 11/05/2021   ALBUMIN 4.1 11/05/2021   ALKPHOS 64 11/05/2021   LIPASE 11.0 11/05/2021    Electrolytes Lab Results  Component Value Date   NA 138 11/05/2021    K 3.9 11/05/2021   CL 102 11/05/2021   CALCIUM 9.5 11/05/2021    Bone Lab Results  Component Value Date   VD25OH 21.22 (L) 04/02/2021   25OHVITD1 28 (L) 10/05/2017   25OHVITD2 <1.0 10/05/2017   25OHVITD3 28 10/05/2017    Inflammation (CRP: Acute Phase) (ESR: Chronic Phase) Lab Results  Component Value Date   CRP 2 10/05/2017   ESRSEDRATE 7 10/05/2017         Note: Above Lab results reviewed.  Imaging  DG PAIN CLINIC C-ARM 1-60 MIN NO REPORT Fluoro was used, but no Radiologist interpretation will be provided.  Please refer to "NOTES" tab for provider progress note.  Assessment  The primary encounter diagnosis was Lumbar Facet syndrome (Bilateral) (R>L). Diagnoses of Chronic low back pain (1ry area of Pain) (Left) w/o sciatica and Chronic hip pain (Right) were also pertinent to this visit.  Plan of Care  Problem-specific:  No problem-specific Assessment & Plan notes found for this encounter.  Shannon [name] has a current medication list which includes the following long-term medication(s): sumatriptan.  Pharmacotherapy (Medications Ordered): No orders of the defined types were placed in this encounter.  Orders:  No orders of the defined types were placed in this encounter.  Follow-up plan:   Return if symptoms worsen or fail to improve.      Interventional Therapies  Risk  Complexity Considerations:   WNL   Planned  Pending:   Diagnostic left lumbar facet MBB #2    Under consideration:   Possible right lumbar facet RFA #1  Possible  right SI joint RFA #1  Possible left SI joint RFA #1  Therapeutic left cervical facet RFA #1    Completed:   Palliative right gluteal MNB  Palliative right SI joint Blk x5 (07/04/2021) (100/90/100/100)  Palliative left SI joint Blk x2 (09/28/2018) (100/100/100/90-100)  Palliative right L4-5 LESI x1 (12/10/2015) (100/100/100/100)  Palliative left L4-5 LESI x1 (01/11/2015) (100/100/95/95)  Therapeutic right trochanteric  bursa inj. x1 (04/09/2021) (100/100/100/100)  Therapeutic right IA hip inj. x1 (04/09/2021) (100/100/100/100)  Palliative left IA hip injection x2 (06/14/2015) (100/100/80/80-90)  Palliative right lumbar facet MBB x3 (07/15/2018) (100/100/90/100)  Diagnostic left lumbar facet MBB x1 (04/13/2018) (100/100/100/100)  Diagnostic right great toe IA inj. x1  Palliative right IA small joint inj. 1st MTP (Dorsal Metatarsophalangeal ) x1 (10/15/2017) (75/75/75/50-75)  Diagnostic left cervical facet MBB x1 (09/13/2019) (100/100/95/>75)    Therapeutic  Palliative (PRN) options:   Palliative right gluteal MNB Palliative SI joint block   Palliative L4-5 LESI   Palliative lumbar facet block   Diagnostic great toe IA injection   Palliative right IA small joint injection 1st MTP (Dorsal Metatarsophalangeal ) #2  Diagnostic cervical facet MBB          Recent Visits Date Type Provider Dept  03/25/22 Procedure visit Milinda Pointer, MD [facility]  03/18/22 Office Visit Milinda Pointer, MD [facility]  Showing recent visits within past 90 days and meeting all other requirements Today's Visits Date Type Provider Dept  04/10/22 Office Visit Milinda Pointer, MD [facility]  Showing today's visits and meeting all other requirements Future Appointments No visits were found meeting these conditions. Showing future appointments within next 90 days and meeting all other requirements  I discussed the assessment and treatment plan with the patient. The patient was provided an opportunity to ask questions and all were answered. The patient agreed with the plan and demonstrated an understanding of the instructions.  Patient advised to call back or seek an in-person evaluation if the symptoms or condition worsens.  Duration of encounter: 12 minutes.  Note by: Gaspar Cola, MD Date: 04/10/2022; Time: 3:42 PM

## 2022-04-11 ENCOUNTER — Encounter: Payer: BC Managed Care – PPO | Admitting: Internal Medicine

## 2022-04-15 DIAGNOSIS — J3089 Other allergic rhinitis: Secondary | ICD-10-CM | POA: Diagnosis not present

## 2022-04-15 DIAGNOSIS — J3081 Allergic rhinitis due to animal (cat) (dog) hair and dander: Secondary | ICD-10-CM | POA: Diagnosis not present

## 2022-04-15 DIAGNOSIS — J301 Allergic rhinitis due to pollen: Secondary | ICD-10-CM | POA: Diagnosis not present

## 2022-04-22 ENCOUNTER — Ambulatory Visit
Admission: EM | Admit: 2022-04-22 | Discharge: 2022-04-22 | Disposition: A | Discharge status: Home / Self Care | Payer: BC Managed Care – PPO

## 2022-04-22 ENCOUNTER — Telehealth: Payer: Self-pay

## 2022-04-22 DIAGNOSIS — J3089 Other allergic rhinitis: Secondary | ICD-10-CM | POA: Diagnosis not present

## 2022-04-22 DIAGNOSIS — J301 Allergic rhinitis due to pollen: Secondary | ICD-10-CM | POA: Diagnosis not present

## 2022-04-22 DIAGNOSIS — R1032 Left lower quadrant pain: Secondary | ICD-10-CM

## 2022-04-22 DIAGNOSIS — J3081 Allergic rhinitis due to animal (cat) (dog) hair and dander: Secondary | ICD-10-CM | POA: Diagnosis not present

## 2022-04-22 NOTE — Telephone Encounter (Signed)
Patient states she went to the [facility] for pain in her left side.  Patient states Urgent Care told her that she needs a CT scan and her PCP would need to order this for her.  Patient states she would like to know if Dr. [name] would be willing to go ahead and order the CT scan for her without patient having to be seen at [facility] since she was just seen at the [facility].

## 2022-04-22 NOTE — ED Provider Notes (Signed)
[name]    CSN: [id number] Arrival date & time: 04/22/22  1547      History   Chief Complaint Chief Complaint  Patient presents with   Abdominal Pain    Entered by patient    HPI Shannon [name] is a 57 y.o. female.   HPI  Presents to urgent care with complaint of abdominal pain x 3 days.  Reporting left lower quadrant pain and constipation.  Endorses BM today at 11 AM.  Has been treating self with Colace and denies improvement in pain.  Denies any urinary symptoms.  Past Medical History:  Diagnosis Date   Anemia    Anxiety    Breast pain, right 11/07/2012   Breathing difficulty 06/20/2014   Environmental allergies    GERD (gastroesophageal reflux disease)    Hiatal hernia    small   History of migraine headaches    Hyperthyroidism    s/p ablation   Vaginitis 01/16/2013    Patient Active Problem List   Diagnosis Date Noted   Chronic low back pain (1ry area of Pain) (Left) w/o sciatica 03/25/2022   Nipple problem 03/18/2022   Chest pain 11/05/2021   Herpes zoster 09/17/2021   Ear fullness 08/19/2021   Greater trochanteric bursitis (Right) 04/25/2021   Bursitis of hip (Right) 04/25/2021   Osteoarthritis of hip (Right) 04/09/2021   Impaired range of motion of hip (Right) 04/09/2021   Allergic rhinitis 04/03/2021   Allergic rhinitis due to animal (cat) (dog) hair and dander 04/03/2021   Allergic rhinitis due to pollen 04/03/2021   Chronic allergic conjunctivitis 04/03/2021   Cough 04/03/2021   Idiopathic urticaria 04/03/2021   Rash and other nonspecific skin eruption 04/03/2021   Chronic hip pain (Right) 04/03/2021   Somatic dysfunction of sacroiliac joint (Right) 04/03/2021   Sacroiliac joint dysfunction (Right) 04/03/2021   Breast pain 10/11/2020   Sleep difficulties 10/07/2020   TMJ arthralgia 04/14/2020   Neck pain 04/14/2020   Vaginal discomfort 03/31/2020   Family history of cancer 03/31/2020   Osteopenia 09/25/2019   DDD  (degenerative disc disease), cervical (C5-6, C6-7) 09/05/2019   Grade 1 Anterolisthesis of C5/C6 09/05/2019   Cervical facet arthropathy (Bilateral) (L>R) 09/05/2019   Cervical facet syndrome (Bilateral) 09/05/2019   Cervicalgia 08/22/2019   Cervical myofascial pain syndrome (Left) 08/22/2019   Occipital neuralgia (Left) 08/22/2019   Nipple tenderness 07/03/2019   Itching 04/24/2019   Right calf pain 03/13/2019   Axillary tenderness, right 03/13/2019   COVID-19 virus infection 01/16/2019   Urinary frequency 12/26/2018   Exposure to COVID-19 virus 11/21/2018   Cerumen impaction 09/11/2018   Preoperative testing 07/05/2018   Vaginal discharge 05/30/2018   DDD (degenerative disc disease), lumbosacral 04/13/2018   Vitamin D insufficiency 11/17/2017   Osteoarthritis of great toe joint (Right) 10/15/2017   Chronic toe pain (Right) 10/05/2017   Great toe pain (Right) 10/05/2017   Disorder of skeletal system 10/05/2017   Pharmacologic therapy 10/05/2017   Problems influencing health status 10/05/2017   Lumbar Facet syndrome (Bilateral) (R>L) 07/20/2017   Spondylosis without myelopathy or radiculopathy, lumbosacral region 07/20/2017   Urinary urgency 06/28/2017   Chronic pain syndrome 06/15/2017   Other specified dorsopathies, sacral and sacrococcygeal region 06/15/2017   Chronic myofascial pain syndrome (Right Gluteous Muscle) 03/03/2016   Chronic sacroiliac joint pain (Right) 01/14/2016   History of hip replacement (Left) 11/12/2015   Osteoarthritis of sacroiliac joint (Bilateral) (R>L) 07/12/2015   Axillary lump 05/22/2015   Post-menopausal bleeding 02/20/2015  Chronic low back pain (1ry area of Pain) (Bilateral) (R>L) w/o sciatica 12/16/2014   Lumbar lateral recess stenosis (L4-5) (Left) 12/16/2014   Lumbar facet hypertrophy 12/16/2014   Chronic lumbar radicular pain (intermittent) (L4 Dermatome) (Left) 12/16/2014   Chronic hip pain (resolved after hip replacement) (Left)  12/16/2014   Nicotine dependence 12/16/2014   Tobacco abuse 12/16/2014   Lumbar spondylosis 12/16/2014   Encounter for chronic pain management 12/16/2014   Epigastric pain 10/19/2014   Gastroesophageal reflux disease without esophagitis 10/18/2014   Anxiety 06/20/2014   Health care maintenance 04/02/2014   Menopausal symptoms 11/07/2012   Migraine 05/21/2012   Anemia 02/01/2012   Hyperthyroidism 01/30/2012   GERD (gastroesophageal reflux disease) 01/30/2012    Past Surgical History:  Procedure Laterality Date   BREAST BIOPSY Right 12/05/2015   neg   CESAREAN SECTION  11/98   Dr Roena Malady   CHOLECYSTECTOMY  11/05   Dr Pat Patrick   JOINT REPLACEMENT Left 2017   hip   NASAL SINUS SURGERY  11/93   SEPTOPLASTY  5/90   Dr Rossie Muskrat   TUBAL LIGATION  123456   UMBILICAL HERNIA REPAIR  02/1998    OB History   No obstetric history on file.      Home Medications    Prior to Admission medications   Medication Sig Start Date End Date Taking? Authorizing Provider  acetaminophen (TYLENOL) 500 MG tablet Take 1,000 mg by mouth every 6 (six) hours as needed for moderate pain.    [provider]  ALPRAZolam Duanne Moron) 0.25 MG tablet TAKE ONE TABLET DAILY IF NEEDED FOR ANXIETY 04/04/21   [name], MD  cholecalciferol (VITAMIN D3) 25 MCG (1000 UNIT) tablet Take 1,000 Units by mouth daily.    [provider]  EPINEPHrine 0.3 mg/0.3 mL IJ SOAJ injection  01/14/18   [provider]  esomeprazole (NEXIUM) 20 MG capsule Take 20 mg by mouth daily at 12 noon.    [provider]  esomeprazole (NEXIUM) 40 MG capsule Take 40 mg by mouth daily at 12 noon.    [provider]  Estriol 10 % CREA by Does not apply route.    [provider]  ondansetron (ZOFRAN ODT) 4 MG disintegrating tablet Take 1 tablet (4 mg total) by mouth 2 (two) times daily as needed for nausea or vomiting. 02/06/22   [name], MD  SUMAtriptan (IMITREX) 100 MG tablet  02/15/18    [provider]    Family History Family History  Problem Relation Age of Onset   Heart disease Father    Hypertension Father    Thyroid disease Father    Hypercholesterolemia Father    Kidney cancer Father    Leukemia Father        hairy cell   Hypercholesterolemia Mother    Thyroid disease Mother    Breast cancer Mother 52   Breast cancer Other        maternal great grandmother   Heart disease Maternal Grandfather        MI - age 59   Prostate cancer Paternal Grandfather    Pancreatic cancer Paternal Grandfather    Lung cancer Paternal Grandfather    Bladder Cancer Neg Hx     Social History Social History   Tobacco Use   Smoking status: Former    Types: Cigarettes    Quit date: 01/28/2000    Years since quitting: 22.2   Smokeless tobacco: Never  Substance Use Topics   Alcohol use: No  Alcohol/week: 0.0 standard drinks of alcohol   Drug use: No     Allergies   Codeine, Decongest-aid [pseudoephedrine], Flagyl [metronidazole], and Sulfa antibiotics   Review of Systems Review of Systems   Physical Exam Triage Vital Signs ED Triage Vitals  Enc Vitals Group     BP      Pulse      Resp      Temp      Temp src      SpO2      Weight      Height      Head Circumference      Peak Flow      Pain Score      Pain Loc      Pain Edu?      Excl. in [place]?    No data found.  Updated Vital Signs LMP 02/12/2013   Visual Acuity Right Eye Distance:   Left Eye Distance:   Bilateral Distance:    Right Eye Near:   Left Eye Near:    Bilateral Near:     Physical Exam   UC Treatments / Results  Labs (all labs ordered are listed, but only abnormal results are displayed) Labs Reviewed - No data to display  EKG   Radiology No results found.  Procedures Procedures (including critical care time)  Medications Ordered in UC Medications - No data to display  Initial Impression / Assessment and Plan / UC Course  I have reviewed the triage  vital signs and the nursing notes.  Pertinent labs & imaging results that were available during my care of the patient were reviewed by me and considered in my medical decision making (see chart for details).  Reviewed ddx with patient include constipation, diverticulitis (2016 colonoscopy)).  Suggested to the patient that she would need imaging, possibly advanced imaging to diagnose and I could not treat her presumptively for diverticulitis.  Patient states she did not want to go to the ED and would contact her GI provider.  I urged her to not delay evaluation for this as it can be a serious condition if not treated.   Final Clinical Impressions(s) / UC Diagnoses   Final diagnoses:  None   Discharge Instructions   None    ED Prescriptions   None    PDMP not reviewed this encounter.   Rose Phi, [place] 04/22/22 [phone]

## 2022-04-22 NOTE — ED Triage Notes (Addendum)
Patient presents to [facility] for abdominal pain since Saturday. Reports LLQ pain, she reports constipation. Last BM 1100. Treating with colace. States pain is not improving.   Denies urinary symptoms.

## 2022-04-22 NOTE — Discharge Instructions (Signed)
I recommend that you seek evaluation in a facility who can order advanced imaging.  Either go to the ED or contact your primary care or GI provider to request advanced imaging for possible diverticulitis.

## 2022-04-23 ENCOUNTER — Other Ambulatory Visit: Payer: Self-pay

## 2022-04-23 ENCOUNTER — Emergency Department
Admission: EM | Admit: 2022-04-23 | Discharge: 2022-04-23 | Disposition: A | Discharge status: Home / Self Care | Payer: BC Managed Care – PPO | Attending: Emergency Medicine | Admitting: Emergency Medicine

## 2022-04-23 ENCOUNTER — Encounter: Payer: Self-pay | Admitting: Emergency Medicine

## 2022-04-23 ENCOUNTER — Emergency Department: Payer: BC Managed Care – PPO

## 2022-04-23 ENCOUNTER — Encounter: Payer: Self-pay | Admitting: Internal Medicine

## 2022-04-23 DIAGNOSIS — R1032 Left lower quadrant pain: Secondary | ICD-10-CM | POA: Diagnosis not present

## 2022-04-23 DIAGNOSIS — Z96642 Presence of left artificial hip joint: Secondary | ICD-10-CM | POA: Insufficient documentation

## 2022-04-23 DIAGNOSIS — K5792 Diverticulitis of intestine, part unspecified, without perforation or abscess without bleeding: Secondary | ICD-10-CM

## 2022-04-23 DIAGNOSIS — K5732 Diverticulitis of large intestine without perforation or abscess without bleeding: Secondary | ICD-10-CM | POA: Insufficient documentation

## 2022-04-23 DIAGNOSIS — R109 Unspecified abdominal pain: Secondary | ICD-10-CM | POA: Diagnosis not present

## 2022-04-23 LAB — CBC
HCT: 41 % (ref 36.0–46.0)
Hemoglobin: 13.9 g/dL (ref 12.0–15.0)
MCH: 31.2 pg (ref 26.0–34.0)
MCHC: 33.9 g/dL (ref 30.0–36.0)
MCV: 91.9 fL (ref 80.0–100.0)
Platelets: 209 10*3/uL (ref 150–400)
RBC: 4.46 MIL/uL (ref 3.87–5.11)
RDW: 12.8 % (ref 11.5–15.5)
WBC: 10.4 10*3/uL (ref 4.0–10.5)
nRBC: 0 % (ref 0.0–0.2)

## 2022-04-23 LAB — URINALYSIS, ROUTINE W REFLEX MICROSCOPIC
Bacteria, UA: NONE SEEN
Bilirubin Urine: NEGATIVE
Glucose, UA: NEGATIVE mg/dL
Ketones, ur: NEGATIVE mg/dL
Leukocytes,Ua: NEGATIVE
Nitrite: NEGATIVE
Protein, ur: NEGATIVE mg/dL
Specific Gravity, Urine: 1.02 (ref 1.005–1.030)
pH: 5 (ref 5.0–8.0)

## 2022-04-23 LAB — COMPREHENSIVE METABOLIC PANEL
ALT: 18 U/L (ref 0–44)
AST: 18 U/L (ref 15–41)
Albumin: 3.9 g/dL (ref 3.5–5.0)
Alkaline Phosphatase: 54 U/L (ref 38–126)
Anion gap: 11 (ref 5–15)
BUN: 9 mg/dL (ref 6–20)
CO2: 22 mmol/L (ref 22–32)
Calcium: 9.2 mg/dL (ref 8.9–10.3)
Chloride: 103 mmol/L (ref 98–111)
Creatinine, Ser: 0.55 mg/dL (ref 0.44–1.00)
GFR, Estimated: >60 mL/min (ref >60)
Glucose, Bld: 109 mg/dL — ABNORMAL HIGH (ref 70–99)
Potassium: 3.5 mmol/L (ref 3.5–5.1)
Sodium: 136 mmol/L (ref 135–145)
Total Bilirubin: 1 mg/dL (ref 0.3–1.2)
Total Protein: 6.9 g/dL (ref 6.5–8.1)

## 2022-04-23 LAB — LIPASE, BLOOD: Lipase: 21 U/L (ref 11–51)

## 2022-04-23 MED ORDER — ONDANSETRON 4 MG PO TBDP: 4.0000 mg | ORAL_TABLET | Freq: Four times a day (QID) | ORAL | 0 refills | Status: DC | PRN

## 2022-04-23 MED ORDER — SODIUM CHLORIDE 0.9 % IV BOLUS (SEPSIS)
1000.0000 mL | Freq: Once | INTRAVENOUS | Status: AC
  Administered 2022-04-23: 1000 mL via INTRAVENOUS

## 2022-04-23 MED ORDER — AMOXICILLIN-POT CLAVULANATE 875-125 MG PO TABS: 1.0000 | ORAL_TABLET | Freq: Two times a day (BID) | ORAL | 0 refills | Status: AC

## 2022-04-23 MED ORDER — HYDROCODONE-ACETAMINOPHEN 5-325 MG PO TABS: 2.0000 | ORAL_TABLET | Freq: Four times a day (QID) | ORAL | 0 refills | Status: DC | PRN

## 2022-04-23 MED ORDER — IOHEXOL 300 MG/ML  SOLN
100.0000 mL | Freq: Once | INTRAMUSCULAR | Status: AC | PRN
  Administered 2022-04-23: 100 mL via INTRAVENOUS

## 2022-04-23 NOTE — ED Provider Notes (Signed)
[facility] Provider Note    Event Date/Time   First MD Initiated Contact with Patient 04/23/22 [phone]     (approximate)   History   Abdominal Pain   HPI  Shannon [name] is a 57 y.o. female with history of cholecystectomy, umbilical hernia repair, C-section who presents to the emergency department with left lower quadrant abdominal pain that started 3 days ago.  States at that time she was constipated which is not normal for her.  She states she has had some nausea but no vomiting.  No diarrhea.  No bloody stools or melena.  No dysuria, hematuria, vaginal bleeding or discharge.   History provided by patient.    Past Medical History:  Diagnosis Date   Anemia    Anxiety    Breast pain, right 11/07/2012   Breathing difficulty 06/20/2014   Environmental allergies    GERD (gastroesophageal reflux disease)    Hiatal hernia    small   History of migraine headaches    Hyperthyroidism    s/p ablation   Vaginitis 01/16/2013    Past Surgical History:  Procedure Laterality Date   BREAST BIOPSY Right 12/05/2015   neg   CESAREAN SECTION  11/98   Dr Roena Malady   CHOLECYSTECTOMY  11/05   Dr Pat Patrick   JOINT REPLACEMENT Left 2017   hip   NASAL SINUS SURGERY  11/93   SEPTOPLASTY  5/90   Dr Rossie Muskrat   TUBAL LIGATION  [id number]   UMBILICAL HERNIA REPAIR  02/1998    MEDICATIONS:  Prior to Admission medications   Medication Sig Start Date End Date Taking? Authorizing Provider  acetaminophen (TYLENOL) 500 MG tablet Take 1,000 mg by mouth every 6 (six) hours as needed for moderate pain.    [provider]  ALPRAZolam Duanne Moron) 0.25 MG tablet TAKE ONE TABLET DAILY IF NEEDED FOR ANXIETY 04/04/21   [name], MD  cholecalciferol (VITAMIN D3) 25 MCG (1000 UNIT) tablet Take 1,000 Units by mouth daily.    [provider]  EPINEPHrine 0.3 mg/0.3 mL IJ SOAJ injection  01/14/18   [provider]  esomeprazole (NEXIUM) 20 MG capsule Take 20 mg by  mouth daily at 12 noon.    [provider]  esomeprazole (NEXIUM) 40 MG capsule Take 40 mg by mouth daily at 12 noon.    [provider]  Estriol 10 % CREA by Does not apply route.    [provider]  ondansetron (ZOFRAN ODT) 4 MG disintegrating tablet Take 1 tablet (4 mg total) by mouth 2 (two) times daily as needed for nausea or vomiting. 02/06/22   [name], MD  SUMAtriptan (IMITREX) 100 MG tablet  02/15/18   [provider]    Physical Exam   Triage Vital Signs: ED Triage Vitals  Enc Vitals Group     BP 04/23/22 0329 134/83     Pulse Rate 04/23/22 0329 (!) 106     Resp 04/23/22 0329 18     Temp 04/23/22 0329 98.3 F (36.8 C)     Temp Source 04/23/22 0329 Oral     SpO2 04/23/22 0329 99 %     Weight 04/23/22 0328 120 lb (54.4 kg)     Height 04/23/22 0328 5\' 2"  (1.575 m)     Head Circumference --      Peak Flow --      Pain Score 04/23/22 0328 7     Pain Loc --  Pain Edu? --      Excl. in [place]? --     Most recent vital signs: Vitals:   04/23/22 0329 04/23/22 0333  BP: 134/83 131/78  Pulse: (!) 106 (!) 105  Resp: 18   Temp: 98.3 F (36.8 C)   SpO2: 99% 97%    CONSTITUTIONAL: Alert, responds appropriately to questions. Well-appearing; well-nourished HEAD: Normocephalic, atraumatic EYES: Conjunctivae clear, pupils appear equal, sclera nonicteric ENT: normal nose; moist mucous membranes NECK: Supple, normal ROM CARD: RRR; S1 and S2 appreciated RESP: Normal chest excursion without splinting or tachypnea; breath sounds clear and equal bilaterally; no wheezes, no rhonchi, no rales, no hypoxia or respiratory distress, speaking full sentences ABD/GI: Non-distended; soft, tender to palpation in the left lower quadrant without guarding or rebound BACK: The back appears normal EXT: Normal ROM in all joints; no deformity noted, no edema SKIN: Normal color for age and race; warm; no rash on exposed skin NEURO: Moves all extremities  equally, normal speech PSYCH: The patient's mood and manner are appropriate.   ED Results / Procedures / Treatments   LABS: (all labs ordered are listed, but only abnormal results are displayed) Labs Reviewed  COMPREHENSIVE METABOLIC PANEL - Abnormal; Notable for the following components:      Result Value   Glucose, Bld 109 (*)    All other components within normal limits  URINALYSIS, ROUTINE W REFLEX MICROSCOPIC - Abnormal; Notable for the following components:   Color, Urine YELLOW (*)    APPearance HAZY (*)    Hgb urine dipstick SMALL (*)    All other components within normal limits  LIPASE, BLOOD  CBC     EKG:   RADIOLOGY: My personal review and interpretation of imaging: Acute uncomplicated diverticulitis.  Appendix normal.  I have personally reviewed all radiology reports.   CT ABDOMEN PELVIS W CONTRAST  Result Date: 04/23/2022 CLINICAL DATA:  Left lower quadrant pain EXAM: CT ABDOMEN AND PELVIS WITH CONTRAST TECHNIQUE: Multidetector CT imaging of the abdomen and pelvis was performed using the standard protocol following bolus administration of intravenous contrast. RADIATION DOSE REDUCTION: This exam was performed according to the departmental dose-optimization program which includes automated exposure control, adjustment of the mA and/or kV according to patient size and/or use of iterative reconstruction technique. CONTRAST:  158mL OMNIPAQUE IOHEXOL 300 MG/ML  SOLN COMPARISON:  07/28/2017 FINDINGS: Lower chest:  Coronary atherosclerosis. Hepatobiliary: No focal liver abnormality.Cholecystectomy. Pancreas: Unremarkable. Spleen: Unremarkable. Adrenals/Urinary Tract: Negative adrenals. No hydronephrosis or stone. Unremarkable bladder. Stomach/Bowel: Fat inflammation and wall thickening around a distal descending colonic diverticulum. No abscess or pneumoperitoneum. No bowel obstruction. Negative appendix Vascular/Lymphatic: No acute vascular abnormality. Atheromatous  calcification of the aorta. No mass or adenopathy. Reproductive:No acute finding Other: No ascites or pneumoperitoneum. Small fatty left groin hernia with some ascitic fluid. Coarse calcification in the rectovaginal recess, dystrophic appearing and from uncertain cause but stable from 2019 without any adjacent fat inflammation, calcification measuring up to 2.6 cm. Musculoskeletal: No acute abnormalities. Unremarkable left hip arthroplasty. IMPRESSION: 1. Distal descending diverticulitis. No complicating abscess or pneumoperitoneum. 2. Fatty left groin hernia. 3. Atherosclerosis including the coronary arteries. Electronically Signed   By: Jorje Guild M.D.   On: 04/23/2022 04:32     PROCEDURES:  Critical Care performed: No      Procedures    IMPRESSION / MDM / ASSESSMENT AND PLAN / ED COURSE  I reviewed the triage vital signs and the nursing notes.    Patient here with left lower quadrant  tenderness for 3 days.     DIFFERENTIAL DIAGNOSIS (includes but not limited to):   Diverticulitis, colitis, bowel obstruction, kidney stone, UTI, pyelonephritis, less likely ovarian cyst given patient is postmenopausal   Patient's presentation is most consistent with acute complicated illness / injury requiring diagnostic workup.   PLAN: Will obtain abdominal labs, urine, CT of the abdomen pelvis.  She declines any pain medication.  Will give IV fluids.   MEDICATIONS GIVEN IN ED: Medications  sodium chloride 0.9 % bolus 1,000 mL (1,000 mLs Intravenous New Bag/Given 04/23/22 0408)  iohexol (OMNIPAQUE) 300 MG/ML solution 100 mL (100 mLs Intravenous Contrast Given 04/23/22 0417)     ED COURSE: Labs show no leukocytosis, normal electrolytes, renal function, LFTs and lipase.  Urine shows no sign of infection.  CT scan reviewed and interpreted by myself and the radiologist and shows distal descending diverticulitis without abscess, perforation.  Normal-appearing appendix.  Patient is still  well-appearing here without systemic symptoms.  I feel she is safe for discharge for outpatient management.  Discussed supportive care instructions, alternating Tylenol, Motrin, liquid diet.  Discussed with patient that normally for uncomplicated diverticulitis we do not treat with antibiotics due to her current recommendations but will give her prescription for Augmentin if symptoms not improving in 3 to 4 days.  Recommended if she does start the antibiotics that she take a probiotic daily.  Will discharge with short course of narcotic analgesia and antiemetics if needed for home.  Discussed return precautions.  She does have a PCP and gastroenterologist for follow-up.  She verbalized understanding and is comfortable with this plan.   At this time, I do not feel there is any life-threatening condition present. I reviewed all nursing notes, vitals, pertinent previous records.  All lab and urine results, EKGs, imaging ordered have been independently reviewed and interpreted by myself.  I reviewed all available radiology reports from any imaging ordered this visit.  Based on my assessment, I feel the patient is safe to be discharged home without further emergent workup and can continue workup as an outpatient as needed. Discussed all findings, treatment plan as well as usual and customary return precautions.  They verbalize understanding and are comfortable with this plan.  Outpatient follow-up has been provided as needed.  All questions have been answered.    CONSULTS:  none   OUTSIDE RECORDS REVIEWED: Reviewed urgent care note from yesterday.       FINAL CLINICAL IMPRESSION(S) / ED DIAGNOSES   Final diagnoses:  LLQ abdominal pain  Diverticulitis     Rx / DC Orders   ED Discharge Orders          Ordered    amoxicillin-clavulanate (AUGMENTIN) 875-125 MG tablet  2 times daily        04/23/22 0447    HYDROcodone-acetaminophen (NORCO/VICODIN) 5-325 MG tablet  Every 6 hours PRN         04/23/22 0447    ondansetron (ZOFRAN-ODT) 4 MG disintegrating tablet  Every 6 hours PRN        04/23/22 0447             Note:  This document was prepared using Dragon voice recognition software and may include unintentional dictation errors.   Tyler Cubit, Delice Bison, DO 04/23/22 [phone]

## 2022-04-23 NOTE — Telephone Encounter (Signed)
See my chart message. Pt in ED.

## 2022-04-23 NOTE — ED Notes (Signed)
Pt to CT via WC and rad staff

## 2022-04-23 NOTE — Discharge Instructions (Addendum)
For most patients who are appropriate for outpatient management, we suggest against antibiotic treatment. There appear to be similar outcomes regardless of antibiotic use. However, clinicians may reasonably choose to use oral antibiotics in patients who have comorbidities or systemic symptoms.  Additional supportive care includes:  ?Pain control with oral analgesics (eg, acetaminophen, ibuprofen, or oxycodone)  ?Liquid diet, advanced based on clinical response (typically in two to three days)  ?Clinical reassessment within one week and weekly thereafter until complete symptom resolution  Patients who deteriorate or do not improve with outpatient therapy should be admitted to inpatient treatment.    You may alternate Tylenol 1000 mg every 6 hours as needed for pain, fever and Ibuprofen 800 mg every 6-8 hours as needed for pain, fever.  Please take Ibuprofen with food.  Do not take more than 4000 mg of Tylenol (acetaminophen) in a 24 hour period.   You are being provided a prescription for opiates (also known as narcotics) for pain control.  Opiates can be addictive and should only be used when absolutely necessary for pain control when other alternatives do not work.  We recommend you only use them for the recommended amount of time and only as prescribed.  Please do not take with other sedative medications or alcohol.  Please do not drive, operate machinery, make important decisions while taking opiates.  Please note that these medications can be addictive and have high abuse potential.  Patients can become addicted to narcotics after only taking them for a few days.  Please keep these medications locked away from children, teenagers or any family members with history of substance abuse.  Narcotic pain medicine may also make you constipated.  You may use over-the-counter medications such as MiraLAX, Colace to prevent constipation.  If you become constipated, you may use over-the-counter enemas as  needed.  Itching and nausea are also common side effects of narcotic pain medication.  If you develop uncontrolled vomiting or a rash, please stop these medications and seek medical care.   If symptoms or not improving in 3 to 4 days you may begin taking antibiotics twice daily for 10 days.  I recommend while taking antibiotics that you take an over-the-counter probiotic daily.   If you develop worsening pain that is uncontrolled with medications at home, bloody stool or black and tarry stools, vomiting that does not stop, fever of 100.4 or higher despite antibiotics for more than 24 hours, please return to the emergency department.

## 2022-04-23 NOTE — ED Triage Notes (Signed)
Pt presents ambulatory to triage via POV with complaints of "sharp" LLQ pain that started 3 days ago. Pt was seen at [facility] who advised her to come her for CT scan. She notes being told that she had Diverticulitis in the past following a colonoscopy but pt states she was never informed of that. Endorses constipation and has been taking OTC stool softeners. A&Ox4 at this time. Denies fever, N/V/D, CP or SOB.

## 2022-04-28 ENCOUNTER — Ambulatory Visit (INDEPENDENT_AMBULATORY_CARE_PROVIDER_SITE_OTHER): Payer: BC Managed Care – PPO

## 2022-04-28 DIAGNOSIS — E538 Deficiency of other specified B group vitamins: Secondary | ICD-10-CM | POA: Diagnosis not present

## 2022-04-28 MED ORDER — CYANOCOBALAMIN 1000 MCG/ML IJ SOLN
1000.0000 ug | Freq: Once | INTRAMUSCULAR | Status: AC
  Administered 2022-04-28: 1000 ug via INTRAMUSCULAR

## 2022-04-28 NOTE — Progress Notes (Signed)
Pt presented for their vitamin B12 injection. Pt was identified through two identifiers. Pt tolerated shot well in their left  deltoid.  

## 2022-04-29 DIAGNOSIS — J3081 Allergic rhinitis due to animal (cat) (dog) hair and dander: Secondary | ICD-10-CM | POA: Diagnosis not present

## 2022-04-29 DIAGNOSIS — J3089 Other allergic rhinitis: Secondary | ICD-10-CM | POA: Diagnosis not present

## 2022-04-29 DIAGNOSIS — J301 Allergic rhinitis due to pollen: Secondary | ICD-10-CM | POA: Diagnosis not present

## 2022-04-30 DIAGNOSIS — K5732 Diverticulitis of large intestine without perforation or abscess without bleeding: Secondary | ICD-10-CM | POA: Diagnosis not present

## 2022-05-06 DIAGNOSIS — J301 Allergic rhinitis due to pollen: Secondary | ICD-10-CM | POA: Diagnosis not present

## 2022-05-06 DIAGNOSIS — J3081 Allergic rhinitis due to animal (cat) (dog) hair and dander: Secondary | ICD-10-CM | POA: Diagnosis not present

## 2022-05-06 DIAGNOSIS — J3089 Other allergic rhinitis: Secondary | ICD-10-CM | POA: Diagnosis not present

## 2022-05-08 ENCOUNTER — Other Ambulatory Visit (HOSPITAL_COMMUNITY)
Admission: RE | Admit: 2022-05-08 | Discharge: 2022-05-08 | Disposition: A | Discharge status: Home / Self Care | Payer: BC Managed Care – PPO | Source: Ambulatory Visit | Attending: Internal Medicine | Admitting: Internal Medicine

## 2022-05-08 ENCOUNTER — Ambulatory Visit (INDEPENDENT_AMBULATORY_CARE_PROVIDER_SITE_OTHER): Payer: BC Managed Care – PPO | Admitting: Internal Medicine

## 2022-05-08 VITALS — BP 118/70 | HR 76 | Temp 97.9°F | Resp 16 | Ht 62.0 in | Wt 120.0 lb

## 2022-05-08 DIAGNOSIS — Z0000 Encounter for general adult medical examination without abnormal findings: Secondary | ICD-10-CM | POA: Diagnosis not present

## 2022-05-08 DIAGNOSIS — Z124 Encounter for screening for malignant neoplasm of cervix: Secondary | ICD-10-CM | POA: Diagnosis not present

## 2022-05-08 DIAGNOSIS — E559 Vitamin D deficiency, unspecified: Secondary | ICD-10-CM | POA: Diagnosis not present

## 2022-05-08 DIAGNOSIS — F419 Anxiety disorder, unspecified: Secondary | ICD-10-CM

## 2022-05-08 DIAGNOSIS — D649 Anemia, unspecified: Secondary | ICD-10-CM

## 2022-05-08 DIAGNOSIS — Z13220 Encounter for screening for lipoid disorders: Secondary | ICD-10-CM | POA: Diagnosis not present

## 2022-05-08 DIAGNOSIS — K219 Gastro-esophageal reflux disease without esophagitis: Secondary | ICD-10-CM

## 2022-05-08 DIAGNOSIS — E0590 Thyrotoxicosis, unspecified without thyrotoxic crisis or storm: Secondary | ICD-10-CM | POA: Diagnosis not present

## 2022-05-08 NOTE — Progress Notes (Signed)
Subjective:    Patient ID: Shannon [name], female    DOB: 10/16/1965, 57 y.o.   MRN: [id number]  Patient here for  Chief Complaint  Patient presents with   Annual Exam    HPI Here for a physical exam.  Recently evaluated - ER - diagnosed with diverticulitis. Was seen  in the ED 3/27 for chief complaint of 3-day history of acute LLQ abdominal pain, constipation, and nausea without vomiting. Labs were significant for mild leukocytosis (13.7K) with normal renal function and normal LFTs. CT abd/pelvis with contrast did confirm uncomplicated descending colon diverticulitis. She was given 10-day prescription of Augmentin. Had appt with GI 04/30/22.  Recommended the augmentin with plans for colonoscopy 2 months after resolution of symptoms. Also has had persistent issues with GERD.  Saw GI 03/06/22 - functional dyspepsia.  Recommended continuing nexium and trial of FDGard.  Did not feel EGD warranted. She is doing better.  Feels better.  Abdominal pain is better.  Appetite is getting back to normal.  Bowels are moving. Discussed importance of keeping bowels moving. Discussed benefiber. No chest pain or sob.  No cough or congestion.    Past Medical History:  Diagnosis Date   Anemia    Anxiety    Breast pain, right 11/07/2012   Breathing difficulty 06/20/2014   Environmental allergies    GERD (gastroesophageal reflux disease)    Hiatal hernia    small   History of migraine headaches    Hyperthyroidism    s/p ablation   Vaginitis 01/16/2013   Past Surgical History:  Procedure Laterality Date   BREAST BIOPSY Right 12/05/2015   neg   CESAREAN SECTION  11/98   Dr Colletta [place]   CHOLECYSTECTOMY  11/05   Dr Michela Pitcher   JOINT REPLACEMENT Left 2017   hip   NASAL SINUS SURGERY  11/93   SEPTOPLASTY  5/90   Dr Esmeralda Links   TUBAL LIGATION  02/1998   UMBILICAL HERNIA REPAIR  02/1998   Family History  Problem Relation Age of Onset   Heart disease Father    Hypertension Father    Thyroid disease Father     Hypercholesterolemia Father    Kidney cancer Father    Leukemia Father        hairy cell   Hypercholesterolemia Mother    Thyroid disease Mother    Breast cancer Mother 49   Breast cancer Other        maternal great grandmother   Heart disease Maternal Grandfather        MI - age 26   Prostate cancer Paternal Grandfather    Pancreatic cancer Paternal Grandfather    Lung cancer Paternal Grandfather    Bladder Cancer Neg Hx    Social History   Socioeconomic History   Marital status: Married    Spouse name: Not on file   Number of children: 1   Years of education: Not on file   Highest education level: Not on file  Occupational History   Not on file  Tobacco Use   Smoking status: Former    Types: Cigarettes    Quit date: 01/28/2000    Years since quitting: 22.3   Smokeless tobacco: Never  Substance and Sexual Activity   Alcohol use: No    Alcohol/week: 0.0 standard drinks of alcohol   Drug use: No   Sexual activity: Not on file  Other Topics Concern   Not on file  Social History Narrative   Not on file  Social Determinants of Health   Financial Resource Strain: Not on file  Food Insecurity: Not on file  Transportation Needs: Not on file  Physical Activity: Not on file  Stress: Not on file  Social Connections: Not on file     Review of Systems  Constitutional:  Negative for appetite change and unexpected weight change.  HENT:  Negative for congestion and sinus pressure.   Respiratory:  Negative for cough, chest tightness and shortness of breath.   Cardiovascular:  Negative for chest pain, palpitations and leg swelling.  Gastrointestinal:  Negative for abdominal pain, diarrhea, nausea and vomiting.  Genitourinary:  Negative for difficulty urinating and dysuria.  Musculoskeletal:  Negative for joint swelling and myalgias.  Skin:  Negative for color change and rash.  Neurological:  Negative for dizziness and headaches.  Psychiatric/Behavioral:  Negative for  agitation and dysphoric mood.        Objective:     BP 118/70   Pulse 76   Temp 97.9 F (36.6 C)   Resp 16   Ht 5\' 2"  (1.575 m)   Wt 120 lb (54.4 kg)   LMP 02/12/2013   SpO2 98%   BMI 21.95 kg/m  Wt Readings from Last 3 Encounters:  05/08/22 120 lb (54.4 kg)  04/23/22 120 lb (54.4 kg)  03/25/22 124 lb (56.2 kg)    Physical Exam Vitals reviewed.  Constitutional:      General: She is not in acute distress.    Appearance: Normal appearance.  HENT:     Head: Normocephalic and atraumatic.     Right Ear: External ear normal.     Left Ear: External ear normal.  Eyes:     General: No scleral icterus.       Right eye: No discharge.        Left eye: No discharge.     Conjunctiva/sclera: Conjunctivae normal.  Neck:     Thyroid: No thyromegaly.  Cardiovascular:     Rate and Rhythm: Normal rate and regular rhythm.  Pulmonary:     Effort: No respiratory distress.     Breath sounds: Normal breath sounds. No wheezing.  Abdominal:     General: Bowel sounds are normal.     Palpations: Abdomen is soft.     Tenderness: There is no abdominal tenderness.  Musculoskeletal:        General: No swelling or tenderness.     Cervical back: Neck supple. No tenderness.  Lymphadenopathy:     Cervical: No cervical adenopathy.  Skin:    Findings: No erythema or rash.  Neurological:     Mental Status: She is alert.  Psychiatric:        Mood and Affect: Mood normal.        Behavior: Behavior normal.      Outpatient Encounter Medications as of 05/08/2022  Medication Sig   acetaminophen (TYLENOL) 500 MG tablet Take 1,000 mg by mouth every 6 (six) hours as needed for moderate pain.   ALPRAZolam (XANAX) 0.25 MG tablet TAKE ONE TABLET DAILY IF NEEDED FOR ANXIETY   cholecalciferol (VITAMIN D3) 25 MCG (1000 UNIT) tablet Take 1,000 Units by mouth daily.   EPINEPHrine 0.3 mg/0.3 mL IJ SOAJ injection    esomeprazole (NEXIUM) 40 MG capsule Take 40 mg by mouth daily at 12 noon.   Estriol 10 %  CREA by Does not apply route.   ondansetron (ZOFRAN-ODT) 4 MG disintegrating tablet Take 1 tablet (4 mg total) by mouth every 6 (six) hours as  needed for nausea or vomiting.   SUMAtriptan (IMITREX) 100 MG tablet    [DISCONTINUED] esomeprazole (NEXIUM) 20 MG capsule Take 20 mg by mouth daily at 12 noon.   [DISCONTINUED] HYDROcodone-acetaminophen (NORCO/VICODIN) 5-325 MG tablet Take 2 tablets by mouth every 6 (six) hours as needed.   No facility-administered encounter medications on file as of 05/08/2022.     Lab Results  Component Value Date   WBC 10.4 04/23/2022   HGB 13.9 04/23/2022   HCT 41.0 04/23/2022   PLT 209 04/23/2022   GLUCOSE 109 (H) 04/23/2022   CHOL 195 04/02/2021   TRIG 110.0 04/02/2021   HDL 77.10 04/02/2021   LDLCALC 96 04/02/2021   ALT 18 04/23/2022   AST 18 04/23/2022   NA 136 04/23/2022   K 3.5 04/23/2022   CL 103 04/23/2022   CREATININE 0.55 04/23/2022   BUN 9 04/23/2022   CO2 22 04/23/2022   TSH 1.65 04/02/2021    CT ABDOMEN PELVIS W CONTRAST  Result Date: 04/23/2022 CLINICAL DATA:  Left lower quadrant pain EXAM: CT ABDOMEN AND PELVIS WITH CONTRAST TECHNIQUE: Multidetector CT imaging of the abdomen and pelvis was performed using the standard protocol following bolus administration of intravenous contrast. RADIATION DOSE REDUCTION: This exam was performed according to the departmental dose-optimization program which includes automated exposure control, adjustment of the mA and/or kV according to patient size and/or use of iterative reconstruction technique. CONTRAST:  OMNIPAQUE IOHEXOL 300 MG/ML  SOLN COMPARISON:  07/28/2017 FINDINGS: Lower chest:  Coronary atherosclerosis. Hepatobiliary: No focal liver abnormality.Cholecystectomy. Pancreas: Unremarkable. Spleen: Unremarkable. Adrenals/Urinary Tract: Negative adrenals. No hydronephrosis or stone. Unremarkable bladder. Stomach/Bowel: Fat inflammation and wall thickening around a distal descending colonic  diverticulum. No abscess or pneumoperitoneum. No bowel obstruction. Negative appendix Vascular/Lymphatic: No acute vascular abnormality. Atheromatous calcification of the aorta. No mass or adenopathy. Reproductive:No acute finding Other: No ascites or pneumoperitoneum. Small fatty left groin hernia with some ascitic fluid. Coarse calcification in the rectovaginal recess, dystrophic appearing and from uncertain cause but stable from 2019 without any adjacent fat inflammation, calcification measuring up to 2.6 cm. Musculoskeletal: No acute abnormalities. Unremarkable left hip arthroplasty. IMPRESSION: 1. Distal descending diverticulitis. No complicating abscess or pneumoperitoneum. 2. Fatty left groin hernia. 3. Atherosclerosis including the coronary arteries. Electronically Signed   By: Tiburcio Pea M.D.   On: 04/23/2022 04:32       Assessment & Plan:  Routine general medical examination at a health care facility  Vitamin D insufficiency Assessment & Plan: Restart vitamin D supplements.   Orders: -     VITAMIN D 25 Hydroxy (Vit-D Deficiency, Fractures); Future  Hyperthyroidism Assessment & Plan: S/p ablation.  Follow tsh.   Orders: -     Basic metabolic panel; Future -     TSH; Future  Screening cholesterol level -     Lipid panel; Future -     Hepatic function panel; Future  Health care maintenance Assessment & Plan: Physical today 05/08/22.  PAP 03/29/20 - negative with negative HPV. Colonoscopy 11/2014.  Recommended f/u in 11/2024.  Had f/u with GI as outlined.  Colonoscopy recommendations as outlined. Mammogram 10/21/21 - Birads I.  The 10-year ASCVD risk score (Arnett DK, et al., 2019) is: 1.5%   Values used to calculate the score:     Age: 22 years     Sex: Female     Is Non-Hispanic African American: No     Diabetic: No     Tobacco smoker: No  Systolic Blood Pressure: 109 mmHg     Is BP treated: No     HDL Cholesterol: 77.1 mg/dL     Total Cholesterol: 195 mg/dL  Low  cholesterol diet and exercise.     Screening for cervical cancer -     Cytology - PAP  Anemia, unspecified type Assessment & Plan: Follow cbc. Planning for colonoscopy this summer as outlined.    Anxiety Assessment & Plan: Overall appears to be handling things relatively well.  Follow.    Gastroesophageal reflux disease, unspecified whether esophagitis present Assessment & Plan: Saw GI 03/06/22 - functional dyspepsia.  Recommended continuing nexium and trial of FDGard.  Did not feel EGD warranted.      Dale Concordia, MD

## 2022-05-08 NOTE — Assessment & Plan Note (Addendum)
Physical today 05/08/22.  PAP 03/29/20 - negative with negative HPV. Colonoscopy 11/2014.  Recommended f/u in 11/2024.  Had f/u with GI as outlined.  Colonoscopy recommendations as outlined. Mammogram 10/21/21 - Birads I.  The 10-year ASCVD risk score (Arnett DK, et al., 2019) is: 1.5%   Values used to calculate the score:     Age: 57 years     Sex: Female     Is Non-Hispanic African American: No     Diabetic: No     Tobacco smoker: No     Systolic Blood Pressure: 109 mmHg     Is BP treated: No     HDL Cholesterol: 77.1 mg/dL     Total Cholesterol: 195 mg/dL  Low cholesterol diet and exercise.

## 2022-05-12 ENCOUNTER — Encounter: Payer: Self-pay | Admitting: Internal Medicine

## 2022-05-12 LAB — CYTOLOGY - PAP
Comment: NEGATIVE
Diagnosis: NEGATIVE
High risk HPV: NEGATIVE

## 2022-05-12 NOTE — Assessment & Plan Note (Signed)
Saw GI 03/06/22 - functional dyspepsia.  Recommended continuing nexium and trial of FDGard.  Did not feel EGD warranted.

## 2022-05-12 NOTE — Assessment & Plan Note (Signed)
S/p ablation.  Follow tsh.  

## 2022-05-12 NOTE — Assessment & Plan Note (Signed)
Follow cbc. Planning for colonoscopy this summer as outlined.

## 2022-05-12 NOTE — Assessment & Plan Note (Signed)
Restart vitamin D supplements

## 2022-05-12 NOTE — Assessment & Plan Note (Signed)
Overall appears to be handling things relatively well.  Follow.   

## 2022-05-13 DIAGNOSIS — J301 Allergic rhinitis due to pollen: Secondary | ICD-10-CM | POA: Diagnosis not present

## 2022-05-13 DIAGNOSIS — J3089 Other allergic rhinitis: Secondary | ICD-10-CM | POA: Diagnosis not present

## 2022-05-13 DIAGNOSIS — J3081 Allergic rhinitis due to animal (cat) (dog) hair and dander: Secondary | ICD-10-CM | POA: Diagnosis not present

## 2022-05-20 DIAGNOSIS — J301 Allergic rhinitis due to pollen: Secondary | ICD-10-CM | POA: Diagnosis not present

## 2022-05-20 DIAGNOSIS — J3081 Allergic rhinitis due to animal (cat) (dog) hair and dander: Secondary | ICD-10-CM | POA: Diagnosis not present

## 2022-05-20 DIAGNOSIS — J3089 Other allergic rhinitis: Secondary | ICD-10-CM | POA: Diagnosis not present

## 2022-05-23 ENCOUNTER — Other Ambulatory Visit (INDEPENDENT_AMBULATORY_CARE_PROVIDER_SITE_OTHER): Payer: BC Managed Care – PPO

## 2022-05-23 DIAGNOSIS — E559 Vitamin D deficiency, unspecified: Secondary | ICD-10-CM

## 2022-05-23 DIAGNOSIS — E0590 Thyrotoxicosis, unspecified without thyrotoxic crisis or storm: Secondary | ICD-10-CM | POA: Diagnosis not present

## 2022-05-23 DIAGNOSIS — Z13220 Encounter for screening for lipoid disorders: Secondary | ICD-10-CM | POA: Diagnosis not present

## 2022-05-23 LAB — BASIC METABOLIC PANEL
BUN: 12 mg/dL (ref 6–23)
CO2: 28 mEq/L (ref 19–32)
Calcium: 9.4 mg/dL (ref 8.4–10.5)
Chloride: 104 mEq/L (ref 96–112)
Creatinine, Ser: 0.66 mg/dL (ref 0.40–1.20)
GFR: 96.51 mL/min (ref >60.00)
Glucose, Bld: 78 mg/dL (ref 70–99)
Potassium: 3.8 mEq/L (ref 3.5–5.1)
Sodium: 142 mEq/L (ref 135–145)

## 2022-05-23 LAB — LIPID PANEL
Cholesterol: 178 mg/dL (ref 0–200)
HDL: 66.5 mg/dL (ref >39.00)
LDL Cholesterol: 88 mg/dL (ref 0–99)
NonHDL: 111.49
Total CHOL/HDL Ratio: 3
Triglycerides: 116 mg/dL (ref 0.0–149.0)
VLDL: 23.2 mg/dL (ref 0.0–40.0)

## 2022-05-23 LAB — TSH: TSH: 1.45 u[IU]/mL (ref 0.35–5.50)

## 2022-05-23 LAB — HEPATIC FUNCTION PANEL
ALT: 13 U/L (ref 0–35)
AST: 16 U/L (ref 0–37)
Albumin: 4 g/dL (ref 3.5–5.2)
Alkaline Phosphatase: 58 U/L (ref 39–117)
Bilirubin, Direct: 0.1 mg/dL (ref 0.0–0.3)
Total Bilirubin: 0.4 mg/dL (ref 0.2–1.2)
Total Protein: 6.3 g/dL (ref 6.0–8.3)

## 2022-05-23 LAB — VITAMIN D 25 HYDROXY (VIT D DEFICIENCY, FRACTURES): VITD: 21.86 ng/mL — ABNORMAL LOW (ref 30.00–100.00)

## 2022-05-26 ENCOUNTER — Telehealth: Payer: Self-pay | Admitting: *Deleted

## 2022-05-26 NOTE — Telephone Encounter (Deleted)
Left voicemail to return call (see below)    Your vitamin D level is low.  Need to confirm if taking vitamin D.  If not taking, have her start vitamin D3 2000 units per day (over the counter).  We will follow.  Cholesterol levels improved.  Thyroid test and kidney function tests are normal.

## 2022-05-26 NOTE — Telephone Encounter (Signed)
Pt called back and I read the message to her and she is taking the vitamin D1000

## 2022-05-26 NOTE — Telephone Encounter (Signed)
noted 

## 2022-05-26 NOTE — Telephone Encounter (Addendum)
Left voicemail to return call.  Your vitamin D level is low.  Need to confirm if taking vitamin D.  If not taking, have her start vitamin D3 2000 units per day (over the counter).  We will follow.  Cholesterol levels improved.  Thyroid test and kidney function tests are normal.

## 2022-05-27 DIAGNOSIS — J3089 Other allergic rhinitis: Secondary | ICD-10-CM | POA: Diagnosis not present

## 2022-05-27 DIAGNOSIS — J301 Allergic rhinitis due to pollen: Secondary | ICD-10-CM | POA: Diagnosis not present

## 2022-05-27 DIAGNOSIS — J3081 Allergic rhinitis due to animal (cat) (dog) hair and dander: Secondary | ICD-10-CM | POA: Diagnosis not present

## 2022-05-29 ENCOUNTER — Ambulatory Visit (INDEPENDENT_AMBULATORY_CARE_PROVIDER_SITE_OTHER): Payer: BC Managed Care – PPO | Admitting: *Deleted

## 2022-05-29 DIAGNOSIS — E538 Deficiency of other specified B group vitamins: Secondary | ICD-10-CM

## 2022-05-29 MED ORDER — CYANOCOBALAMIN 1000 MCG/ML IJ SOLN
1000.0000 ug | Freq: Once | INTRAMUSCULAR | Status: AC
  Administered 2022-05-29: 1000 ug via INTRAMUSCULAR

## 2022-05-29 NOTE — Progress Notes (Signed)
Pt received B12 injection in right deltoid. Pt tolerated it well with no complaints or concerns. 

## 2022-06-03 DIAGNOSIS — J3089 Other allergic rhinitis: Secondary | ICD-10-CM | POA: Diagnosis not present

## 2022-06-03 DIAGNOSIS — J3081 Allergic rhinitis due to animal (cat) (dog) hair and dander: Secondary | ICD-10-CM | POA: Diagnosis not present

## 2022-06-03 DIAGNOSIS — J301 Allergic rhinitis due to pollen: Secondary | ICD-10-CM | POA: Diagnosis not present

## 2022-06-04 DIAGNOSIS — H6123 Impacted cerumen, bilateral: Secondary | ICD-10-CM | POA: Diagnosis not present

## 2022-06-04 DIAGNOSIS — H6063 Unspecified chronic otitis externa, bilateral: Secondary | ICD-10-CM | POA: Diagnosis not present

## 2022-06-04 DIAGNOSIS — H9203 Otalgia, bilateral: Secondary | ICD-10-CM | POA: Diagnosis not present

## 2022-06-04 DIAGNOSIS — M26601 Right temporomandibular joint disorder, unspecified: Secondary | ICD-10-CM | POA: Diagnosis not present

## 2022-06-05 ENCOUNTER — Ambulatory Visit: Payer: BC Managed Care – PPO | Admitting: Podiatry

## 2022-06-05 VITALS — BP 122/76 | HR 75

## 2022-06-05 DIAGNOSIS — M2021 Hallux rigidus, right foot: Secondary | ICD-10-CM

## 2022-06-05 DIAGNOSIS — M7751 Other enthesopathy of right foot: Secondary | ICD-10-CM | POA: Diagnosis not present

## 2022-06-05 NOTE — Progress Notes (Signed)
Subjective:  Patient ID: Shannon [name], female    DOB: 05-16-1965,  MRN: [id number]  Chief Complaint  Patient presents with   Toe Pain    "My toe is hurting in the joint.  He gave me injection the last time I was here.  It was doing okay and then all of a sudden."    57 y.o. female presents with the above complaint.  Patient presents with right first metatarsophalangeal joint pain.  She states she is doing well doing better.  She states the injection lasted her 2-1/2 years.  She would like to do another injection now started come back and flareup again.  Denies any other acute complaints  Review of Systems: Negative except as noted in the HPI. Denies N/V/F/Ch.  Past Medical History:  Diagnosis Date   Anemia    Anxiety    Breast pain, right 11/07/2012   Breathing difficulty 06/20/2014   Environmental allergies    GERD (gastroesophageal reflux disease)    Hiatal hernia    small   History of migraine headaches    Hyperthyroidism    s/p ablation   Vaginitis 01/16/2013    Current Outpatient Medications:    acetaminophen (TYLENOL) 500 MG tablet, Take 1,000 mg by mouth every 6 (six) hours as needed for moderate pain., Disp: , Rfl:    ALPRAZolam (XANAX) 0.25 MG tablet, TAKE ONE TABLET DAILY IF NEEDED FOR ANXIETY, Disp: 30 tablet, Rfl: 0   cholecalciferol (VITAMIN D3) 25 MCG (1000 UNIT) tablet, Take 1,000 Units by mouth daily., Disp: , Rfl:    EPINEPHrine 0.3 mg/0.3 mL IJ SOAJ injection, , Disp: , Rfl:    esomeprazole (NEXIUM) 40 MG capsule, Take 40 mg by mouth daily at 12 noon., Disp: , Rfl:    Estriol 10 % CREA, by Does not apply route., Disp: , Rfl:    ondansetron (ZOFRAN-ODT) 4 MG disintegrating tablet, Take 1 tablet (4 mg total) by mouth every 6 (six) hours as needed for nausea or vomiting., Disp: 20 tablet, Rfl: 0   SUMAtriptan (IMITREX) 100 MG tablet, , Disp: , Rfl:   Social History   Tobacco Use  Smoking Status Former   Types: Cigarettes   Quit date: 01/28/2000   Years  since quitting: 22.3  Smokeless Tobacco Never    Allergies  Allergen Reactions   Codeine Other (See Comments)    Severe Headache   Decongest-Aid [Pseudoephedrine] Other (See Comments)    Increased heart rate   Flagyl [Metronidazole]    Sulfa Antibiotics    Objective:   Vitals:   06/05/22 1348  BP: 122/76  Pulse: 75   There is no height or weight on file to calculate BMI. Constitutional Well developed. Well nourished.  Vascular Dorsalis pedis pulses palpable bilaterally. Posterior tibial pulses palpable bilaterally. Capillary refill normal to all digits.  No cyanosis or clubbing noted. Pedal hair growth normal.  Neurologic Normal speech. Oriented to person, place, and time. Epicritic sensation to light touch grossly present bilaterally.  Dermatologic Nails well groomed and normal in appearance. No open wounds. No skin lesions.  Orthopedic:  Pain on palpation of the right first metatarsophalangeal joint.  Pain with range of motion of the first metatarsophalangeal joint.  Intra-articular pain noted.  No pain at the sesamoidal complex.  No extensor or flexor tendinitis noted.   Radiographs: None Assessment:   No diagnosis found.  Plan:  Patient was evaluated and treated and all questions answered.  Right first MPJ capsulitis with underlying osteoarthritis -I explained  to patient the etiology of capsulitis and various treatment options were extensively discussed.  I discussed with her that as long as a steroid injections are helping I do not mind doing them every couple of months.  If there is ever decreasing frequency needed for steroid injection we will discuss surgical treatment options at that time.  Patient agrees with the plan like to proceed with a steroid injection -A steroid injection was performed at right first MTP osteoarthritis using 1% plain Lidocaine and 10 mg of Kenalog. This was well tolerated.   Right hallux rigidus with underlying pes planovalgus  semiflexible -I explained patient the etiology of hallux rigidus and various treatment options were extensively discussed.  I believe patient will benefit from custom-made orthotics to help control the hindfoot motion and support the arch of the foot with an incorporation of Morton's extension to take the stress off of the right first metatarsophalangeal joint. -Patient already has orthotics and is functioning well  No follow-ups on file.

## 2022-06-06 ENCOUNTER — Encounter: Payer: Self-pay | Admitting: Internal Medicine

## 2022-06-06 DIAGNOSIS — M19079 Primary osteoarthritis, unspecified ankle and foot: Secondary | ICD-10-CM | POA: Insufficient documentation

## 2022-06-10 DIAGNOSIS — J3081 Allergic rhinitis due to animal (cat) (dog) hair and dander: Secondary | ICD-10-CM | POA: Diagnosis not present

## 2022-06-10 DIAGNOSIS — J301 Allergic rhinitis due to pollen: Secondary | ICD-10-CM | POA: Diagnosis not present

## 2022-06-10 DIAGNOSIS — J3089 Other allergic rhinitis: Secondary | ICD-10-CM | POA: Diagnosis not present

## 2022-06-17 DIAGNOSIS — J301 Allergic rhinitis due to pollen: Secondary | ICD-10-CM | POA: Diagnosis not present

## 2022-06-17 DIAGNOSIS — J3089 Other allergic rhinitis: Secondary | ICD-10-CM | POA: Diagnosis not present

## 2022-06-17 DIAGNOSIS — J3081 Allergic rhinitis due to animal (cat) (dog) hair and dander: Secondary | ICD-10-CM | POA: Diagnosis not present

## 2022-06-24 DIAGNOSIS — J301 Allergic rhinitis due to pollen: Secondary | ICD-10-CM | POA: Diagnosis not present

## 2022-06-24 DIAGNOSIS — J3089 Other allergic rhinitis: Secondary | ICD-10-CM | POA: Diagnosis not present

## 2022-06-24 DIAGNOSIS — J3081 Allergic rhinitis due to animal (cat) (dog) hair and dander: Secondary | ICD-10-CM | POA: Diagnosis not present

## 2022-06-25 DIAGNOSIS — R1013 Epigastric pain: Secondary | ICD-10-CM | POA: Diagnosis not present

## 2022-06-25 DIAGNOSIS — K219 Gastro-esophageal reflux disease without esophagitis: Secondary | ICD-10-CM | POA: Diagnosis not present

## 2022-06-25 DIAGNOSIS — R1032 Left lower quadrant pain: Secondary | ICD-10-CM | POA: Diagnosis not present

## 2022-06-25 DIAGNOSIS — Z8719 Personal history of other diseases of the digestive system: Secondary | ICD-10-CM | POA: Diagnosis not present

## 2022-06-30 ENCOUNTER — Ambulatory Visit (INDEPENDENT_AMBULATORY_CARE_PROVIDER_SITE_OTHER): Payer: BC Managed Care – PPO | Admitting: *Deleted

## 2022-06-30 DIAGNOSIS — E538 Deficiency of other specified B group vitamins: Secondary | ICD-10-CM

## 2022-06-30 MED ORDER — CYANOCOBALAMIN 1000 MCG/ML IJ SOLN
1000.0000 ug | Freq: Once | INTRAMUSCULAR | Status: AC
  Administered 2022-06-30: 1000 ug via INTRAMUSCULAR

## 2022-06-30 NOTE — Progress Notes (Signed)
Pt received B12 injection in left deltoid. Pt tolerated it well with no concerns. 

## 2022-07-01 DIAGNOSIS — J301 Allergic rhinitis due to pollen: Secondary | ICD-10-CM | POA: Diagnosis not present

## 2022-07-01 DIAGNOSIS — J3081 Allergic rhinitis due to animal (cat) (dog) hair and dander: Secondary | ICD-10-CM | POA: Diagnosis not present

## 2022-07-01 DIAGNOSIS — J3089 Other allergic rhinitis: Secondary | ICD-10-CM | POA: Diagnosis not present

## 2022-07-02 DIAGNOSIS — G5603 Carpal tunnel syndrome, bilateral upper limbs: Secondary | ICD-10-CM | POA: Diagnosis not present

## 2022-07-08 DIAGNOSIS — J3081 Allergic rhinitis due to animal (cat) (dog) hair and dander: Secondary | ICD-10-CM | POA: Diagnosis not present

## 2022-07-08 DIAGNOSIS — J301 Allergic rhinitis due to pollen: Secondary | ICD-10-CM | POA: Diagnosis not present

## 2022-07-08 DIAGNOSIS — J3089 Other allergic rhinitis: Secondary | ICD-10-CM | POA: Diagnosis not present

## 2022-07-15 DIAGNOSIS — J301 Allergic rhinitis due to pollen: Secondary | ICD-10-CM | POA: Diagnosis not present

## 2022-07-15 DIAGNOSIS — J3089 Other allergic rhinitis: Secondary | ICD-10-CM | POA: Diagnosis not present

## 2022-07-15 DIAGNOSIS — J3081 Allergic rhinitis due to animal (cat) (dog) hair and dander: Secondary | ICD-10-CM | POA: Diagnosis not present

## 2022-07-22 DIAGNOSIS — J3081 Allergic rhinitis due to animal (cat) (dog) hair and dander: Secondary | ICD-10-CM | POA: Diagnosis not present

## 2022-07-22 DIAGNOSIS — J3089 Other allergic rhinitis: Secondary | ICD-10-CM | POA: Diagnosis not present

## 2022-07-22 DIAGNOSIS — J301 Allergic rhinitis due to pollen: Secondary | ICD-10-CM | POA: Diagnosis not present

## 2022-07-29 DIAGNOSIS — J3081 Allergic rhinitis due to animal (cat) (dog) hair and dander: Secondary | ICD-10-CM | POA: Diagnosis not present

## 2022-07-29 DIAGNOSIS — J301 Allergic rhinitis due to pollen: Secondary | ICD-10-CM | POA: Diagnosis not present

## 2022-07-29 DIAGNOSIS — J3089 Other allergic rhinitis: Secondary | ICD-10-CM | POA: Diagnosis not present

## 2022-08-04 ENCOUNTER — Ambulatory Visit: Payer: BC Managed Care – PPO

## 2022-08-05 DIAGNOSIS — H1045 Other chronic allergic conjunctivitis: Secondary | ICD-10-CM | POA: Diagnosis not present

## 2022-08-05 DIAGNOSIS — R052 Subacute cough: Secondary | ICD-10-CM | POA: Diagnosis not present

## 2022-08-05 DIAGNOSIS — J301 Allergic rhinitis due to pollen: Secondary | ICD-10-CM | POA: Diagnosis not present

## 2022-08-05 DIAGNOSIS — R21 Rash and other nonspecific skin eruption: Secondary | ICD-10-CM | POA: Diagnosis not present

## 2022-08-05 DIAGNOSIS — J3089 Other allergic rhinitis: Secondary | ICD-10-CM | POA: Diagnosis not present

## 2022-08-05 DIAGNOSIS — J3081 Allergic rhinitis due to animal (cat) (dog) hair and dander: Secondary | ICD-10-CM | POA: Diagnosis not present

## 2022-08-07 ENCOUNTER — Ambulatory Visit: Payer: BC Managed Care – PPO | Admitting: Internal Medicine

## 2022-08-07 ENCOUNTER — Encounter: Payer: Self-pay | Admitting: Internal Medicine

## 2022-08-07 VITALS — BP 120/70 | HR 88 | Temp 97.9°F | Resp 16 | Ht 62.0 in | Wt 124.6 lb

## 2022-08-07 DIAGNOSIS — K219 Gastro-esophageal reflux disease without esophagitis: Secondary | ICD-10-CM

## 2022-08-07 DIAGNOSIS — E538 Deficiency of other specified B group vitamins: Secondary | ICD-10-CM

## 2022-08-07 DIAGNOSIS — F419 Anxiety disorder, unspecified: Secondary | ICD-10-CM

## 2022-08-07 DIAGNOSIS — E0590 Thyrotoxicosis, unspecified without thyrotoxic crisis or storm: Secondary | ICD-10-CM

## 2022-08-07 DIAGNOSIS — D649 Anemia, unspecified: Secondary | ICD-10-CM

## 2022-08-07 DIAGNOSIS — Z1231 Encounter for screening mammogram for malignant neoplasm of breast: Secondary | ICD-10-CM

## 2022-08-07 MED ORDER — CYANOCOBALAMIN 1000 MCG/ML IJ SOLN
1000.0000 ug | Freq: Once | INTRAMUSCULAR | Status: AC
  Administered 2022-08-07: 1000 ug via INTRAMUSCULAR

## 2022-08-07 NOTE — Progress Notes (Signed)
Subjective:    Patient ID: Shannon [name], female    DOB: 1965/02/16, 57 y.o.   MRN: [id number]  Patient here for  Chief Complaint  Patient presents with   Medical Management of Chronic Issues    HPI Here to follow up regarding GERD and increased stress.  Previously evaluated in ER - diverticulitis.  Treated with augmentin.  Saw GI 04/2022 - colonoscopy scheduled - end of month.  Have discussed benefiber. Saw ortho 07/02/22 - CTS.  S/p injection.  Stays active.  No chest pain or sob reported.  Handling stress.    Past Medical History:  Diagnosis Date   Anemia    Anxiety    Breast pain, right 11/07/2012   Breathing difficulty 06/20/2014   Environmental allergies    GERD (gastroesophageal reflux disease)    Hiatal hernia    small   History of migraine headaches    Hyperthyroidism    s/p ablation   Vaginitis 01/16/2013   Past Surgical History:  Procedure Laterality Date   BREAST BIOPSY Right 12/05/2015   neg   CESAREAN SECTION  11/98   Dr Colletta [place]   CHOLECYSTECTOMY  11/05   Dr Michela Pitcher   JOINT REPLACEMENT Left 2017   hip   NASAL SINUS SURGERY  11/93   SEPTOPLASTY  5/90   Dr Esmeralda Links   TUBAL LIGATION  02/1998   UMBILICAL HERNIA REPAIR  02/1998   Family History  Problem Relation Age of Onset   Heart disease Father    Hypertension Father    Thyroid disease Father    Hypercholesterolemia Father    Kidney cancer Father    Leukemia Father        hairy cell   Hypercholesterolemia Mother    Thyroid disease Mother    Breast cancer Mother 23   Breast cancer Other        maternal great grandmother   Heart disease Maternal Grandfather        MI - age 53   Prostate cancer Paternal Grandfather    Pancreatic cancer Paternal Grandfather    Lung cancer Paternal Grandfather    Bladder Cancer Neg Hx    Social History   Socioeconomic History   Marital status: Married    Spouse name: Not on file   Number of children: 1   Years of education: Not on file   Highest education  level: Not on file  Occupational History   Not on file  Tobacco Use   Smoking status: Former    Current packs/day: 0.00    Types: Cigarettes    Quit date: 01/28/2000    Years since quitting: 22.5   Smokeless tobacco: Never  Substance and Sexual Activity   Alcohol use: No    Alcohol/week: 0.0 standard drinks of alcohol   Drug use: No   Sexual activity: Not on file  Other Topics Concern   Not on file  Social History Narrative   Not on file   Social Determinants of Health   Financial Resource Strain: Not on file  Food Insecurity: Not on file  Transportation Needs: Not on file  Physical Activity: Not on file  Stress: Not on file  Social Connections: Not on file     Review of Systems  Constitutional:  Negative for appetite change and unexpected weight change.  HENT:  Negative for congestion and sinus pressure.   Respiratory:  Negative for cough, chest tightness and shortness of breath.   Cardiovascular:  Negative for chest pain,  palpitations and leg swelling.  Gastrointestinal:  Negative for abdominal pain, diarrhea, nausea and vomiting.  Genitourinary:  Negative for difficulty urinating and dysuria.  Musculoskeletal:  Negative for joint swelling and myalgias.  Skin:  Negative for color change and rash.  Neurological:  Negative for dizziness and headaches.  Psychiatric/Behavioral:  Negative for agitation and dysphoric mood.        Objective:     BP 120/70   Pulse 88   Temp 97.9 F (36.6 C)   Resp 16   Ht 5\' 2"  (1.575 m)   Wt 124 lb 9.6 oz (56.5 kg)   LMP 02/12/2013   SpO2 98%   BMI 22.79 kg/m  Wt Readings from Last 3 Encounters:  08/07/22 124 lb 9.6 oz (56.5 kg)  05/08/22 120 lb (54.4 kg)  04/23/22 120 lb (54.4 kg)    Physical Exam Vitals reviewed.  Constitutional:      General: She is not in acute distress.    Appearance: Normal appearance.  HENT:     Head: Normocephalic and atraumatic.     Right Ear: External ear normal.     Left Ear: External ear  normal.  Eyes:     General: No scleral icterus.       Right eye: No discharge.        Left eye: No discharge.     Conjunctiva/sclera: Conjunctivae normal.  Neck:     Thyroid: No thyromegaly.  Cardiovascular:     Rate and Rhythm: Normal rate and regular rhythm.  Pulmonary:     Effort: No respiratory distress.     Breath sounds: Normal breath sounds. No wheezing.  Abdominal:     General: Bowel sounds are normal.     Palpations: Abdomen is soft.     Tenderness: There is no abdominal tenderness.  Musculoskeletal:        General: No swelling or tenderness.     Cervical back: Neck supple. No tenderness.  Lymphadenopathy:     Cervical: No cervical adenopathy.  Skin:    Findings: No erythema or rash.  Neurological:     Mental Status: She is alert.  Psychiatric:        Mood and Affect: Mood normal.        Behavior: Behavior normal.      Outpatient Encounter Medications as of 08/07/2022  Medication Sig   acetaminophen (TYLENOL) 500 MG tablet Take 1,000 mg by mouth every 6 (six) hours as needed for moderate pain.   ALPRAZolam (XANAX) 0.25 MG tablet TAKE ONE TABLET DAILY IF NEEDED FOR ANXIETY   cholecalciferol (VITAMIN D3) 25 MCG (1000 UNIT) tablet Take 1,000 Units by mouth daily.   EPINEPHrine 0.3 mg/0.3 mL IJ SOAJ injection    esomeprazole (NEXIUM) 40 MG capsule Take 40 mg by mouth daily at 12 noon.   Estriol 10 % CREA by Does not apply route.   ondansetron (ZOFRAN-ODT) 4 MG disintegrating tablet Take 1 tablet (4 mg total) by mouth every 6 (six) hours as needed for nausea or vomiting.   SUMAtriptan (IMITREX) 100 MG tablet    [EXPIRED] cyanocobalamin (VITAMIN B12) injection 1,000 mcg    No facility-administered encounter medications on file as of 08/07/2022.     Lab Results  Component Value Date   WBC 10.4 04/23/2022   HGB 13.9 04/23/2022   HCT 41.0 04/23/2022   PLT 209 04/23/2022   GLUCOSE 78 05/23/2022   CHOL 178 05/23/2022   TRIG 116.0 05/23/2022   HDL 66.50 05/23/2022  LDLCALC 88 05/23/2022   ALT 13 05/23/2022   AST 16 05/23/2022   NA 142 05/23/2022   K 3.8 05/23/2022   CL 104 05/23/2022   CREATININE 0.66 05/23/2022   BUN 12 05/23/2022   CO2 28 05/23/2022   TSH 1.45 05/23/2022    No results found.     Assessment & Plan:  B12 deficiency -     Cyanocobalamin  Visit for screening mammogram -     3D Screening Mammogram, Left and Right; Future  Anemia, unspecified type Assessment & Plan: Follow cbc.  Planning for colonoscopy end of this month.    Anxiety Assessment & Plan: Overall appears to be handling things relatively well.  Follow.    Gastroesophageal reflux disease, unspecified whether esophagitis present Assessment & Plan: Saw GI 03/06/22 - functional dyspepsia.  Recommended continuing nexium and trial of FDGard.  Did not feel EGD warranted.   Hyperthyroidism Assessment & Plan: S/p ablation.  Follow tsh.       D[employer], MD

## 2022-08-11 ENCOUNTER — Encounter: Payer: Self-pay | Admitting: Internal Medicine

## 2022-08-11 NOTE — Assessment & Plan Note (Signed)
 Overall appears to be handling things relatively well.  Follow.   

## 2022-08-11 NOTE — Assessment & Plan Note (Signed)
Saw GI 03/06/22 - functional dyspepsia.  Recommended continuing nexium and trial of FDGard.  Did not feel EGD warranted.

## 2022-08-11 NOTE — Assessment & Plan Note (Signed)
S/p ablation.  Follow tsh.  

## 2022-08-11 NOTE — Assessment & Plan Note (Signed)
Follow cbc.  Planning for colonoscopy end of this month.

## 2022-08-12 DIAGNOSIS — J3089 Other allergic rhinitis: Secondary | ICD-10-CM | POA: Diagnosis not present

## 2022-08-12 DIAGNOSIS — J3081 Allergic rhinitis due to animal (cat) (dog) hair and dander: Secondary | ICD-10-CM | POA: Diagnosis not present

## 2022-08-12 DIAGNOSIS — J301 Allergic rhinitis due to pollen: Secondary | ICD-10-CM | POA: Diagnosis not present

## 2022-08-19 DIAGNOSIS — J3089 Other allergic rhinitis: Secondary | ICD-10-CM | POA: Diagnosis not present

## 2022-08-19 DIAGNOSIS — J301 Allergic rhinitis due to pollen: Secondary | ICD-10-CM | POA: Diagnosis not present

## 2022-08-19 DIAGNOSIS — J3081 Allergic rhinitis due to animal (cat) (dog) hair and dander: Secondary | ICD-10-CM | POA: Diagnosis not present

## 2022-08-20 DIAGNOSIS — J3089 Other allergic rhinitis: Secondary | ICD-10-CM | POA: Diagnosis not present

## 2022-08-20 DIAGNOSIS — J301 Allergic rhinitis due to pollen: Secondary | ICD-10-CM | POA: Diagnosis not present

## 2022-08-25 ENCOUNTER — Ambulatory Visit: Payer: BC Managed Care – PPO

## 2022-08-25 DIAGNOSIS — K5732 Diverticulitis of large intestine without perforation or abscess without bleeding: Secondary | ICD-10-CM | POA: Diagnosis not present

## 2022-08-25 DIAGNOSIS — Z09 Encounter for follow-up examination after completed treatment for conditions other than malignant neoplasm: Secondary | ICD-10-CM | POA: Diagnosis not present

## 2022-08-25 DIAGNOSIS — K640 First degree hemorrhoids: Secondary | ICD-10-CM | POA: Diagnosis not present

## 2022-08-25 DIAGNOSIS — K5730 Diverticulosis of large intestine without perforation or abscess without bleeding: Secondary | ICD-10-CM | POA: Diagnosis not present

## 2022-08-25 DIAGNOSIS — Z8719 Personal history of other diseases of the digestive system: Secondary | ICD-10-CM | POA: Diagnosis not present

## 2022-08-26 DIAGNOSIS — J3081 Allergic rhinitis due to animal (cat) (dog) hair and dander: Secondary | ICD-10-CM | POA: Diagnosis not present

## 2022-08-26 DIAGNOSIS — J301 Allergic rhinitis due to pollen: Secondary | ICD-10-CM | POA: Diagnosis not present

## 2022-08-26 DIAGNOSIS — J3089 Other allergic rhinitis: Secondary | ICD-10-CM | POA: Diagnosis not present

## 2022-08-27 ENCOUNTER — Encounter (INDEPENDENT_AMBULATORY_CARE_PROVIDER_SITE_OTHER): Payer: Self-pay

## 2022-09-02 DIAGNOSIS — J3089 Other allergic rhinitis: Secondary | ICD-10-CM | POA: Diagnosis not present

## 2022-09-02 DIAGNOSIS — J3081 Allergic rhinitis due to animal (cat) (dog) hair and dander: Secondary | ICD-10-CM | POA: Diagnosis not present

## 2022-09-02 DIAGNOSIS — J301 Allergic rhinitis due to pollen: Secondary | ICD-10-CM | POA: Diagnosis not present

## 2022-09-09 ENCOUNTER — Ambulatory Visit: Payer: BC Managed Care – PPO

## 2022-09-09 DIAGNOSIS — J301 Allergic rhinitis due to pollen: Secondary | ICD-10-CM | POA: Diagnosis not present

## 2022-09-09 DIAGNOSIS — J3081 Allergic rhinitis due to animal (cat) (dog) hair and dander: Secondary | ICD-10-CM | POA: Diagnosis not present

## 2022-09-09 DIAGNOSIS — J3089 Other allergic rhinitis: Secondary | ICD-10-CM | POA: Diagnosis not present

## 2022-09-11 ENCOUNTER — Ambulatory Visit (INDEPENDENT_AMBULATORY_CARE_PROVIDER_SITE_OTHER): Payer: BC Managed Care – PPO

## 2022-09-11 ENCOUNTER — Encounter (INDEPENDENT_AMBULATORY_CARE_PROVIDER_SITE_OTHER): Payer: Self-pay

## 2022-09-11 DIAGNOSIS — E538 Deficiency of other specified B group vitamins: Secondary | ICD-10-CM

## 2022-09-11 MED ORDER — CYANOCOBALAMIN 1000 MCG/ML IJ SOLN
1000.0000 ug | Freq: Once | INTRAMUSCULAR | Status: AC
  Administered 2022-09-11: 1000 ug via INTRAMUSCULAR

## 2022-09-11 NOTE — Progress Notes (Signed)
Patient arrived for a B12 injection and it was administered into her right deltoid. Patient tolerated the injection well.

## 2022-09-16 DIAGNOSIS — J301 Allergic rhinitis due to pollen: Secondary | ICD-10-CM | POA: Diagnosis not present

## 2022-09-16 DIAGNOSIS — J3089 Other allergic rhinitis: Secondary | ICD-10-CM | POA: Diagnosis not present

## 2022-09-16 DIAGNOSIS — J3081 Allergic rhinitis due to animal (cat) (dog) hair and dander: Secondary | ICD-10-CM | POA: Diagnosis not present

## 2022-09-22 ENCOUNTER — Encounter: Payer: Self-pay | Admitting: Internal Medicine

## 2022-09-23 DIAGNOSIS — J3089 Other allergic rhinitis: Secondary | ICD-10-CM | POA: Diagnosis not present

## 2022-09-23 DIAGNOSIS — J3081 Allergic rhinitis due to animal (cat) (dog) hair and dander: Secondary | ICD-10-CM | POA: Diagnosis not present

## 2022-09-23 DIAGNOSIS — J301 Allergic rhinitis due to pollen: Secondary | ICD-10-CM | POA: Diagnosis not present

## 2022-09-23 NOTE — Telephone Encounter (Signed)
Pt scheduled  

## 2022-09-23 NOTE — Telephone Encounter (Signed)
I can add her at 1:30 tomorrow (Wednesday)

## 2022-09-24 ENCOUNTER — Encounter: Payer: Self-pay | Admitting: Internal Medicine

## 2022-09-24 ENCOUNTER — Ambulatory Visit: Payer: BC Managed Care – PPO | Admitting: Internal Medicine

## 2022-09-24 VITALS — BP 118/74 | HR 74 | Temp 97.6°F | Ht 62.0 in | Wt 123.2 lb

## 2022-09-24 DIAGNOSIS — K219 Gastro-esophageal reflux disease without esophagitis: Secondary | ICD-10-CM | POA: Diagnosis not present

## 2022-09-24 DIAGNOSIS — M26621 Arthralgia of right temporomandibular joint: Secondary | ICD-10-CM | POA: Diagnosis not present

## 2022-09-24 MED ORDER — PREDNISONE 10 MG PO TABS: ORAL_TABLET | ORAL | 0 refills | Status: DC

## 2022-09-24 NOTE — Progress Notes (Signed)
Subjective:    Patient ID: Shannon [name], female    DOB: [birth year], 57 y.o.   MRN: [id number]  Patient here for  Chief Complaint  Patient presents with   Otalgia    HPI Work in appt. Work in for increased pain - right ear,jaw and neck pain.  Symptoms started last week.  She has been taking tylenol, aleve and has started flonase. Also applying heat. Headache initially.  No headache now.  No fever.  No rash.  No chest congestion. Head did feel stopped up.  Started flonase.  No significant nasal congestion currently.  No neck stiffness.  She does wear a mouthguard to sleep.    Past Medical History:  Diagnosis Date   Anemia    Anxiety    Breast pain, right 11/07/2012   Breathing difficulty 06/20/2014   Environmental allergies    GERD (gastroesophageal reflux disease)    Hiatal hernia    small   History of migraine headaches    Hyperthyroidism    s/p ablation   Vaginitis 01/16/2013   Past Surgical History:  Procedure Laterality Date   BREAST BIOPSY Right 12/05/2015   neg   CESAREAN SECTION  11/98   Dr Colletta [place]   CHOLECYSTECTOMY  11/05   Dr Michela Pitcher   JOINT REPLACEMENT Left 2017   hip   NASAL SINUS SURGERY  11/93   SEPTOPLASTY  5/90   Dr Esmeralda Links   TUBAL LIGATION  02/1998   UMBILICAL HERNIA REPAIR  02/1998   Family History  Problem Relation Age of Onset   Heart disease Father    Hypertension Father    Thyroid disease Father    Hypercholesterolemia Father    Kidney cancer Father    Leukemia Father        hairy cell   Hypercholesterolemia Mother    Thyroid disease Mother    Breast cancer Mother 58   Breast cancer Other        maternal great grandmother   Heart disease Maternal Grandfather        MI - age 41   Prostate cancer Paternal Grandfather    Pancreatic cancer Paternal Grandfather    Lung cancer Paternal Grandfather    Bladder Cancer Neg Hx    Social History   Socioeconomic History   Marital status: Married    Spouse name: Not on file   Number of  children: 1   Years of education: Not on file   Highest education level: Not on file  Occupational History   Not on file  Tobacco Use   Smoking status: Former    Current packs/day: 0.00    Types: Cigarettes    Quit date: 01/28/2000    Years since quitting: 22.6   Smokeless tobacco: Never  Substance and Sexual Activity   Alcohol use: No    Alcohol/week: 0.0 standard drinks of alcohol   Drug use: No   Sexual activity: Not on file  Other Topics Concern   Not on file  Social History Narrative   Not on file   Social Determinants of Health   Financial Resource Strain: Not on file  Food Insecurity: Not on file  Transportation Needs: Not on file  Physical Activity: Not on file  Stress: Not on file  Social Connections: Not on file     Review of Systems  Constitutional:  Negative for appetite change and unexpected weight change.  HENT:  Negative for sinus pressure.        Some  increased nasal congestion previously as outlined.   Respiratory:  Negative for cough, chest tightness and shortness of breath.   Cardiovascular:  Negative for chest pain, palpitations and leg swelling.  Gastrointestinal:  Negative for abdominal pain and nausea.  Musculoskeletal:  Negative for joint swelling and myalgias.  Skin:  Negative for color change and rash.  Neurological:  Negative for dizziness.       No increased headache now.  Psychiatric/Behavioral:  Negative for agitation and dysphoric mood.        Objective:     BP 118/74   Pulse 74   Temp 97.6 F (36.4 C) (Oral)   Ht 5\' 2"  (1.575 m)   Wt 123 lb 3.2 oz (55.9 kg)   LMP 02/12/2013   SpO2 94%   BMI 22.53 kg/m  Wt Readings from Last 3 Encounters:  09/24/22 123 lb 3.2 oz (55.9 kg)  08/07/22 124 lb 9.6 oz (56.5 kg)  05/08/22 120 lb (54.4 kg)    Physical Exam Vitals reviewed.  Constitutional:      General: She is not in acute distress.    Appearance: Normal appearance.  HENT:     Head: Normocephalic and atraumatic.      Comments: Increased pain to palpation - angle of jaw and over TMJ (joint).      Right Ear: Tympanic membrane, ear canal and external ear normal.     Left Ear: Tympanic membrane, ear canal and external ear normal.     Nose: No congestion.     Mouth/Throat:     Pharynx: Oropharynx is clear. No oropharyngeal exudate or posterior oropharyngeal erythema.  Eyes:     General: No scleral icterus.       Right eye: No discharge.        Left eye: No discharge.     Conjunctiva/sclera: Conjunctivae normal.  Neck:     Thyroid: No thyromegaly.  Cardiovascular:     Rate and Rhythm: Normal rate and regular rhythm.  Pulmonary:     Effort: No respiratory distress.     Breath sounds: Normal breath sounds. No wheezing.  Abdominal:     General: Bowel sounds are normal.     Palpations: Abdomen is soft.     Tenderness: There is no abdominal tenderness.  Musculoskeletal:        General: No swelling or tenderness.     Cervical back: Neck supple. No tenderness.  Lymphadenopathy:     Cervical: No cervical adenopathy.  Skin:    Findings: No erythema or rash.  Neurological:     Mental Status: She is alert.  Psychiatric:        Mood and Affect: Mood normal.        Behavior: Behavior normal.      Outpatient Encounter Medications as of 09/24/2022  Medication Sig   predniSONE (DELTASONE) 10 MG tablet Take 4 tablets x 1 day and then decrease by 1/2 tablet per day until down to zero mg.   acetaminophen (TYLENOL) 500 MG tablet Take 1,000 mg by mouth every 6 (six) hours as needed for moderate pain.   ALPRAZolam (XANAX) 0.25 MG tablet TAKE ONE TABLET DAILY IF NEEDED FOR ANXIETY   cholecalciferol (VITAMIN D3) 25 MCG (1000 UNIT) tablet Take 1,000 Units by mouth daily.   EPINEPHrine 0.3 mg/0.3 mL IJ SOAJ injection    esomeprazole (NEXIUM) 40 MG capsule Take 40 mg by mouth daily at 12 noon.   Estriol 10 % CREA by Does not apply route.   ondansetron (  ZOFRAN-ODT) 4 MG disintegrating tablet Take 1 tablet (4 mg  total) by mouth every 6 (six) hours as needed for nausea or vomiting.   SUMAtriptan (IMITREX) 100 MG tablet    No facility-administered encounter medications on file as of 09/24/2022.     Lab Results  Component Value Date   WBC 10.4 04/23/2022   HGB 13.9 04/23/2022   HCT 41.0 04/23/2022   PLT 209 04/23/2022   GLUCOSE 78 05/23/2022   CHOL 178 05/23/2022   TRIG 116.0 05/23/2022   HDL 66.50 05/23/2022   LDLCALC 88 05/23/2022   ALT 13 05/23/2022   AST 16 05/23/2022   NA 142 05/23/2022   K 3.8 05/23/2022   CL 104 05/23/2022   CREATININE 0.66 05/23/2022   BUN 12 05/23/2022   CO2 28 05/23/2022   TSH 1.45 05/23/2022       Assessment & Plan:  Arthralgia of right temporomandibular joint Assessment & Plan: Symptoms and exam as outlined.  Involving right side. Reproducible with palpation over the angle of the jaw. Wearing mouthguard. Taking tylenol and aleve.  Applying heat.  Will treat with prednisone taper as directed. Follow. Call with update.    Gastroesophageal reflux disease, unspecified whether esophagitis present Assessment & Plan: Continue nexium.  Instructed to take prednisone with food.  Follow.    Other orders -     predniSONE; Take 4 tablets x 1 day and then decrease by 1/2 tablet per day until down to zero mg.  Dispense: 18 tablet; Refill: 0     Dale [place], MD

## 2022-09-29 ENCOUNTER — Encounter: Payer: Self-pay | Admitting: Internal Medicine

## 2022-09-29 NOTE — Assessment & Plan Note (Signed)
Continue nexium.  Instructed to take prednisone with food.  Follow.

## 2022-09-29 NOTE — Assessment & Plan Note (Signed)
Symptoms and exam as outlined.  Involving right side. Reproducible with palpation over the angle of the jaw. Wearing mouthguard. Taking tylenol and aleve.  Applying heat.  Will treat with prednisone taper as directed. Follow. Call with update.

## 2022-09-30 DIAGNOSIS — J301 Allergic rhinitis due to pollen: Secondary | ICD-10-CM | POA: Diagnosis not present

## 2022-09-30 DIAGNOSIS — J3089 Other allergic rhinitis: Secondary | ICD-10-CM | POA: Diagnosis not present

## 2022-09-30 DIAGNOSIS — J3081 Allergic rhinitis due to animal (cat) (dog) hair and dander: Secondary | ICD-10-CM | POA: Diagnosis not present

## 2022-10-02 ENCOUNTER — Ambulatory Visit: Payer: BC Managed Care – PPO | Admitting: Internal Medicine

## 2022-10-03 DIAGNOSIS — K5730 Diverticulosis of large intestine without perforation or abscess without bleeding: Secondary | ICD-10-CM | POA: Diagnosis not present

## 2022-10-03 DIAGNOSIS — K219 Gastro-esophageal reflux disease without esophagitis: Secondary | ICD-10-CM | POA: Diagnosis not present

## 2022-10-03 DIAGNOSIS — Z8719 Personal history of other diseases of the digestive system: Secondary | ICD-10-CM | POA: Diagnosis not present

## 2022-10-07 DIAGNOSIS — J3081 Allergic rhinitis due to animal (cat) (dog) hair and dander: Secondary | ICD-10-CM | POA: Diagnosis not present

## 2022-10-07 DIAGNOSIS — J3089 Other allergic rhinitis: Secondary | ICD-10-CM | POA: Diagnosis not present

## 2022-10-07 DIAGNOSIS — J301 Allergic rhinitis due to pollen: Secondary | ICD-10-CM | POA: Diagnosis not present

## 2022-10-13 ENCOUNTER — Ambulatory Visit (INDEPENDENT_AMBULATORY_CARE_PROVIDER_SITE_OTHER): Payer: BC Managed Care – PPO

## 2022-10-13 DIAGNOSIS — E538 Deficiency of other specified B group vitamins: Secondary | ICD-10-CM | POA: Diagnosis not present

## 2022-10-13 DIAGNOSIS — Z23 Encounter for immunization: Secondary | ICD-10-CM

## 2022-10-13 MED ORDER — CYANOCOBALAMIN 1000 MCG/ML IJ SOLN
1000.0000 ug | Freq: Once | INTRAMUSCULAR | Status: AC
  Administered 2022-10-13: 1000 ug via INTRAMUSCULAR

## 2022-10-13 NOTE — Progress Notes (Signed)
Pt presented for their vitamin B12 injection. Pt was identified through two identifiers. Pt tolerated shot well in their left deltoid.   Pt also received flu shot and was given the flu VIS. Pt tolerated the flu shot in the right deltoid

## 2022-10-14 DIAGNOSIS — J3089 Other allergic rhinitis: Secondary | ICD-10-CM | POA: Diagnosis not present

## 2022-10-14 DIAGNOSIS — J301 Allergic rhinitis due to pollen: Secondary | ICD-10-CM | POA: Diagnosis not present

## 2022-10-14 DIAGNOSIS — J3081 Allergic rhinitis due to animal (cat) (dog) hair and dander: Secondary | ICD-10-CM | POA: Diagnosis not present

## 2022-10-21 DIAGNOSIS — J3081 Allergic rhinitis due to animal (cat) (dog) hair and dander: Secondary | ICD-10-CM | POA: Diagnosis not present

## 2022-10-21 DIAGNOSIS — J301 Allergic rhinitis due to pollen: Secondary | ICD-10-CM | POA: Diagnosis not present

## 2022-10-21 DIAGNOSIS — J3089 Other allergic rhinitis: Secondary | ICD-10-CM | POA: Diagnosis not present

## 2022-10-23 ENCOUNTER — Ambulatory Visit
Admission: RE | Admit: 2022-10-23 | Discharge: 2022-10-23 | Disposition: A | Discharge status: Home / Self Care | Payer: BC Managed Care – PPO | Source: Ambulatory Visit | Attending: Internal Medicine | Admitting: Internal Medicine

## 2022-10-23 DIAGNOSIS — Z1231 Encounter for screening mammogram for malignant neoplasm of breast: Secondary | ICD-10-CM | POA: Insufficient documentation

## 2022-10-24 ENCOUNTER — Other Ambulatory Visit: Payer: Self-pay | Admitting: Internal Medicine

## 2022-10-24 DIAGNOSIS — R928 Other abnormal and inconclusive findings on diagnostic imaging of breast: Secondary | ICD-10-CM

## 2022-10-28 DIAGNOSIS — J3081 Allergic rhinitis due to animal (cat) (dog) hair and dander: Secondary | ICD-10-CM | POA: Diagnosis not present

## 2022-10-28 DIAGNOSIS — J301 Allergic rhinitis due to pollen: Secondary | ICD-10-CM | POA: Diagnosis not present

## 2022-10-28 DIAGNOSIS — J3089 Other allergic rhinitis: Secondary | ICD-10-CM | POA: Diagnosis not present

## 2022-11-04 DIAGNOSIS — J3089 Other allergic rhinitis: Secondary | ICD-10-CM | POA: Diagnosis not present

## 2022-11-04 DIAGNOSIS — J3081 Allergic rhinitis due to animal (cat) (dog) hair and dander: Secondary | ICD-10-CM | POA: Diagnosis not present

## 2022-11-04 DIAGNOSIS — J301 Allergic rhinitis due to pollen: Secondary | ICD-10-CM | POA: Diagnosis not present

## 2022-11-05 ENCOUNTER — Ambulatory Visit
Admission: RE | Admit: 2022-11-05 | Discharge: 2022-11-05 | Disposition: A | Discharge status: Home / Self Care | Payer: BC Managed Care – PPO | Source: Ambulatory Visit | Attending: Internal Medicine | Admitting: Internal Medicine

## 2022-11-05 DIAGNOSIS — R928 Other abnormal and inconclusive findings on diagnostic imaging of breast: Secondary | ICD-10-CM | POA: Diagnosis not present

## 2022-11-05 DIAGNOSIS — R92333 Mammographic heterogeneous density, bilateral breasts: Secondary | ICD-10-CM | POA: Diagnosis not present

## 2022-11-11 DIAGNOSIS — J301 Allergic rhinitis due to pollen: Secondary | ICD-10-CM | POA: Diagnosis not present

## 2022-11-11 DIAGNOSIS — J3081 Allergic rhinitis due to animal (cat) (dog) hair and dander: Secondary | ICD-10-CM | POA: Diagnosis not present

## 2022-11-11 DIAGNOSIS — J3089 Other allergic rhinitis: Secondary | ICD-10-CM | POA: Diagnosis not present

## 2022-11-12 ENCOUNTER — Ambulatory Visit: Payer: BC Managed Care – PPO

## 2022-11-12 DIAGNOSIS — E538 Deficiency of other specified B group vitamins: Secondary | ICD-10-CM

## 2022-11-12 MED ORDER — CYANOCOBALAMIN 1000 MCG/ML IJ SOLN
1000.0000 ug | Freq: Once | INTRAMUSCULAR | Status: AC
  Administered 2022-11-12: 1000 ug via INTRAMUSCULAR

## 2022-11-12 NOTE — Progress Notes (Signed)
Patient presented for B 12 injection to left deltoid, patient voiced no concerns nor showed any signs of distress during injection. 

## 2022-11-18 DIAGNOSIS — G5602 Carpal tunnel syndrome, left upper limb: Secondary | ICD-10-CM | POA: Insufficient documentation

## 2022-11-18 DIAGNOSIS — M85811 Other specified disorders of bone density and structure, right shoulder: Secondary | ICD-10-CM | POA: Diagnosis not present

## 2022-11-18 DIAGNOSIS — M26629 Arthralgia of temporomandibular joint, unspecified side: Secondary | ICD-10-CM | POA: Diagnosis not present

## 2022-11-18 DIAGNOSIS — E0590 Thyrotoxicosis, unspecified without thyrotoxic crisis or storm: Secondary | ICD-10-CM | POA: Diagnosis not present

## 2022-11-18 DIAGNOSIS — J3089 Other allergic rhinitis: Secondary | ICD-10-CM | POA: Diagnosis not present

## 2022-11-18 DIAGNOSIS — Z01818 Encounter for other preprocedural examination: Secondary | ICD-10-CM | POA: Diagnosis not present

## 2022-11-18 DIAGNOSIS — L501 Idiopathic urticaria: Secondary | ICD-10-CM | POA: Diagnosis not present

## 2022-11-18 DIAGNOSIS — K219 Gastro-esophageal reflux disease without esophagitis: Secondary | ICD-10-CM | POA: Diagnosis not present

## 2022-11-18 DIAGNOSIS — J301 Allergic rhinitis due to pollen: Secondary | ICD-10-CM | POA: Diagnosis not present

## 2022-11-18 DIAGNOSIS — Z96642 Presence of left artificial hip joint: Secondary | ICD-10-CM | POA: Diagnosis not present

## 2022-11-20 ENCOUNTER — Ambulatory Visit: Payer: BC Managed Care – PPO | Admitting: Podiatry

## 2022-11-20 ENCOUNTER — Encounter: Payer: Self-pay | Admitting: Podiatry

## 2022-11-20 VITALS — BP 126/76 | HR 79

## 2022-11-20 DIAGNOSIS — M7751 Other enthesopathy of right foot: Secondary | ICD-10-CM | POA: Diagnosis not present

## 2022-11-20 NOTE — Progress Notes (Signed)
Subjective:  Patient ID: Shannon [name], female    DOB: 02-Aug-1965,  MRN: [id number]  Chief Complaint  Patient presents with   Toe Pain    "My toe is really hurting.  I think I need an injection."    57 y.o. female presents with the above complaint.  Patient presents with right first metatarsophalangeal joint pain.  She states that she is doing better however her pain started coming back.  Denies any other acute complaints.  Review of Systems: Negative except as noted in the HPI. Denies N/V/F/Ch.  Past Medical History:  Diagnosis Date   Anemia    Anxiety    Breast pain, right 11/07/2012   Breathing difficulty 06/20/2014   Environmental allergies    GERD (gastroesophageal reflux disease)    Hiatal hernia    small   History of migraine headaches    Hyperthyroidism    s/p ablation   Vaginitis 01/16/2013    Current Outpatient Medications:    acetaminophen (TYLENOL) 500 MG tablet, Take 1,000 mg by mouth every 6 (six) hours as needed for moderate pain., Disp: , Rfl:    ALPRAZolam (XANAX) 0.25 MG tablet, TAKE ONE TABLET DAILY IF NEEDED FOR ANXIETY, Disp: 30 tablet, Rfl: 0   cholecalciferol (VITAMIN D3) 25 MCG (1000 UNIT) tablet, Take 1,000 Units by mouth daily., Disp: , Rfl:    EPINEPHrine 0.3 mg/0.3 mL IJ SOAJ injection, , Disp: , Rfl:    esomeprazole (NEXIUM) 40 MG capsule, Take 40 mg by mouth daily at 12 noon., Disp: , Rfl:    Estriol 10 % CREA, by Does not apply route., Disp: , Rfl:    ondansetron (ZOFRAN-ODT) 4 MG disintegrating tablet, Take 1 tablet (4 mg total) by mouth every 6 (six) hours as needed for nausea or vomiting., Disp: 20 tablet, Rfl: 0   SUMAtriptan (IMITREX) 100 MG tablet, , Disp: , Rfl:    predniSONE (DELTASONE) 10 MG tablet, Take 4 tablets x 1 day and then decrease by 1/2 tablet per day until down to zero mg. (Patient not taking: Reported on 11/20/2022), Disp: 18 tablet, Rfl: 0  Social History   Tobacco Use  Smoking Status Former   Current packs/day: 0.00    Types: Cigarettes   Quit date: 01/28/2000   Years since quitting: 22.8  Smokeless Tobacco Never    Allergies  Allergen Reactions   Codeine Other (See Comments)    Severe Headache   Decongest-Aid [Pseudoephedrine] Other (See Comments)    Increased heart rate   Flagyl [Metronidazole]    Sulfa Antibiotics    Objective:   Vitals:   11/20/22 0844  BP: 126/76  Pulse: 79   There is no height or weight on file to calculate BMI. Constitutional Well developed. Well nourished.  Vascular Dorsalis pedis pulses palpable bilaterally. Posterior tibial pulses palpable bilaterally. Capillary refill normal to all digits.  No cyanosis or clubbing noted. Pedal hair growth normal.  Neurologic Normal speech. Oriented to person, place, and time. Epicritic sensation to light touch grossly present bilaterally.  Dermatologic Nails well groomed and normal in appearance. No open wounds. No skin lesions.  Orthopedic:  Pain on palpation of the right first metatarsophalangeal joint.  Pain with range of motion of the first metatarsophalangeal joint.  Intra-articular pain noted.  No pain at the sesamoidal complex.  No extensor or flexor tendinitis noted.   Radiographs: None Assessment:   No diagnosis found.  Plan:  Patient was evaluated and treated and all questions answered.  Right first MPJ  capsulitis with underlying osteoarthritis -I explained to patient the etiology of capsulitis and various treatment options were extensively discussed.  I discussed with her that as long as a steroid injections are helping I do not mind doing them every couple of months.  If there is ever decreasing frequency needed for steroid injection we will discuss surgical treatment options at that time.  Patient agrees with the plan like to proceed with a steroid injection -Another steroid injection was performed at right first MTP osteoarthritis using 1% plain Lidocaine and 10 mg of Kenalog. This was well  tolerated.   Right hallux rigidus with underlying pes planovalgus semiflexible -I explained patient the etiology of hallux rigidus and various treatment options were extensively discussed.  I believe patient will benefit from custom-made orthotics to help control the hindfoot motion and support the arch of the foot with an incorporation of Morton's extension to take the stress off of the right first metatarsophalangeal joint. -Patient already has orthotics and is functioning well  No follow-ups on file.

## 2022-11-24 ENCOUNTER — Inpatient Hospital Stay
Admission: EM | Admit: 2022-11-24 | Discharge: 2022-11-26 | DRG: 281 | Disposition: A | Discharge status: Home / Self Care | Payer: BC Managed Care – PPO | Attending: Internal Medicine | Admitting: Internal Medicine

## 2022-11-24 ENCOUNTER — Ambulatory Visit: Payer: BC Managed Care – PPO | Admitting: Internal Medicine

## 2022-11-24 ENCOUNTER — Emergency Department: Payer: BC Managed Care – PPO

## 2022-11-24 ENCOUNTER — Other Ambulatory Visit: Payer: Self-pay

## 2022-11-24 ENCOUNTER — Encounter: Payer: Self-pay | Admitting: Internal Medicine

## 2022-11-24 VITALS — BP 120/70 | HR 75 | Temp 98.0°F | Resp 16 | Ht 62.0 in | Wt 124.0 lb

## 2022-11-24 DIAGNOSIS — Z9049 Acquired absence of other specified parts of digestive tract: Secondary | ICD-10-CM | POA: Diagnosis not present

## 2022-11-24 DIAGNOSIS — K5730 Diverticulosis of large intestine without perforation or abscess without bleeding: Secondary | ICD-10-CM | POA: Diagnosis not present

## 2022-11-24 DIAGNOSIS — R1013 Epigastric pain: Secondary | ICD-10-CM | POA: Diagnosis not present

## 2022-11-24 DIAGNOSIS — Z8249 Family history of ischemic heart disease and other diseases of the circulatory system: Secondary | ICD-10-CM | POA: Diagnosis not present

## 2022-11-24 DIAGNOSIS — R1084 Generalized abdominal pain: Secondary | ICD-10-CM | POA: Diagnosis not present

## 2022-11-24 DIAGNOSIS — E785 Hyperlipidemia, unspecified: Secondary | ICD-10-CM | POA: Diagnosis present

## 2022-11-24 DIAGNOSIS — Z87891 Personal history of nicotine dependence: Secondary | ICD-10-CM | POA: Diagnosis not present

## 2022-11-24 DIAGNOSIS — K219 Gastro-esophageal reflux disease without esophagitis: Secondary | ICD-10-CM | POA: Diagnosis present

## 2022-11-24 DIAGNOSIS — Z801 Family history of malignant neoplasm of trachea, bronchus and lung: Secondary | ICD-10-CM | POA: Diagnosis not present

## 2022-11-24 DIAGNOSIS — G43809 Other migraine, not intractable, without status migrainosus: Secondary | ICD-10-CM | POA: Diagnosis not present

## 2022-11-24 DIAGNOSIS — Z882 Allergy status to sulfonamides status: Secondary | ICD-10-CM

## 2022-11-24 DIAGNOSIS — Z803 Family history of malignant neoplasm of breast: Secondary | ICD-10-CM

## 2022-11-24 DIAGNOSIS — E039 Hypothyroidism, unspecified: Secondary | ICD-10-CM | POA: Diagnosis present

## 2022-11-24 DIAGNOSIS — Z888 Allergy status to other drugs, medicaments and biological substances status: Secondary | ICD-10-CM | POA: Diagnosis not present

## 2022-11-24 DIAGNOSIS — Z806 Family history of leukemia: Secondary | ICD-10-CM

## 2022-11-24 DIAGNOSIS — F419 Anxiety disorder, unspecified: Secondary | ICD-10-CM | POA: Diagnosis not present

## 2022-11-24 DIAGNOSIS — Z885 Allergy status to narcotic agent status: Secondary | ICD-10-CM | POA: Diagnosis not present

## 2022-11-24 DIAGNOSIS — Z951 Presence of aortocoronary bypass graft: Secondary | ICD-10-CM | POA: Diagnosis not present

## 2022-11-24 DIAGNOSIS — Z8051 Family history of malignant neoplasm of kidney: Secondary | ICD-10-CM | POA: Diagnosis not present

## 2022-11-24 DIAGNOSIS — I214 Non-ST elevation (NSTEMI) myocardial infarction: Secondary | ICD-10-CM | POA: Diagnosis not present

## 2022-11-24 DIAGNOSIS — I5181 Takotsubo syndrome: Secondary | ICD-10-CM | POA: Diagnosis not present

## 2022-11-24 DIAGNOSIS — Z720 Tobacco use: Secondary | ICD-10-CM | POA: Diagnosis present

## 2022-11-24 DIAGNOSIS — G43909 Migraine, unspecified, not intractable, without status migrainosus: Secondary | ICD-10-CM | POA: Diagnosis not present

## 2022-11-24 DIAGNOSIS — Z800 Family history of malignant neoplasm of digestive organs: Secondary | ICD-10-CM

## 2022-11-24 DIAGNOSIS — D259 Leiomyoma of uterus, unspecified: Secondary | ICD-10-CM | POA: Diagnosis not present

## 2022-11-24 DIAGNOSIS — Z79899 Other long term (current) drug therapy: Secondary | ICD-10-CM

## 2022-11-24 DIAGNOSIS — R079 Chest pain, unspecified: Secondary | ICD-10-CM | POA: Diagnosis not present

## 2022-11-24 DIAGNOSIS — M8580 Other specified disorders of bone density and structure, unspecified site: Secondary | ICD-10-CM | POA: Diagnosis present

## 2022-11-24 DIAGNOSIS — Z7982 Long term (current) use of aspirin: Secondary | ICD-10-CM

## 2022-11-24 DIAGNOSIS — E041 Nontoxic single thyroid nodule: Secondary | ICD-10-CM | POA: Diagnosis not present

## 2022-11-24 DIAGNOSIS — J301 Allergic rhinitis due to pollen: Secondary | ICD-10-CM | POA: Diagnosis not present

## 2022-11-24 DIAGNOSIS — I2510 Atherosclerotic heart disease of native coronary artery without angina pectoris: Secondary | ICD-10-CM | POA: Diagnosis not present

## 2022-11-24 DIAGNOSIS — Z83438 Family history of other disorder of lipoprotein metabolism and other lipidemia: Secondary | ICD-10-CM

## 2022-11-24 DIAGNOSIS — J3089 Other allergic rhinitis: Secondary | ICD-10-CM | POA: Diagnosis not present

## 2022-11-24 DIAGNOSIS — E0590 Thyrotoxicosis, unspecified without thyrotoxic crisis or storm: Secondary | ICD-10-CM | POA: Diagnosis present

## 2022-11-24 DIAGNOSIS — R9431 Abnormal electrocardiogram [ECG] [EKG]: Secondary | ICD-10-CM | POA: Diagnosis not present

## 2022-11-24 DIAGNOSIS — I25118 Atherosclerotic heart disease of native coronary artery with other forms of angina pectoris: Secondary | ICD-10-CM | POA: Diagnosis not present

## 2022-11-24 DIAGNOSIS — Z96642 Presence of left artificial hip joint: Secondary | ICD-10-CM | POA: Diagnosis not present

## 2022-11-24 DIAGNOSIS — Z8349 Family history of other endocrine, nutritional and metabolic diseases: Secondary | ICD-10-CM

## 2022-11-24 DIAGNOSIS — I2584 Coronary atherosclerosis due to calcified coronary lesion: Secondary | ICD-10-CM | POA: Diagnosis present

## 2022-11-24 DIAGNOSIS — E782 Mixed hyperlipidemia: Secondary | ICD-10-CM | POA: Diagnosis not present

## 2022-11-24 LAB — BASIC METABOLIC PANEL
Anion gap: 9 (ref 5–15)
BUN: 9 mg/dL (ref 6–20)
CO2: 20 mmol/L — ABNORMAL LOW (ref 22–32)
Calcium: 8.2 mg/dL — ABNORMAL LOW (ref 8.9–10.3)
Chloride: 107 mmol/L (ref 98–111)
Creatinine, Ser: 0.53 mg/dL (ref 0.44–1.00)
GFR, Estimated: >60 mL/min (ref >60)
Glucose, Bld: 77 mg/dL (ref 70–99)
Potassium: 3.8 mmol/L (ref 3.5–5.1)
Sodium: 136 mmol/L (ref 135–145)

## 2022-11-24 LAB — CBC
HCT: 43.3 % (ref 36.0–46.0)
Hemoglobin: 14.1 g/dL (ref 12.0–15.0)
MCH: 30.1 pg (ref 26.0–34.0)
MCHC: 32.6 g/dL (ref 30.0–36.0)
MCV: 92.5 fL (ref 80.0–100.0)
Platelets: 261 10*3/uL (ref 150–400)
RBC: 4.68 MIL/uL (ref 3.87–5.11)
RDW: 12.2 % (ref 11.5–15.5)
WBC: 9.5 10*3/uL (ref 4.0–10.5)
nRBC: 0 % (ref 0.0–0.2)

## 2022-11-24 LAB — PROTIME-INR
INR: 1 (ref 0.8–1.2)
Prothrombin Time: 13.5 s (ref 11.4–15.2)

## 2022-11-24 LAB — TROPONIN I (HIGH SENSITIVITY)
Troponin I (High Sensitivity): 1798 ng/L (ref <18)
Troponin I (High Sensitivity): 2596 ng/L (ref <18)

## 2022-11-24 LAB — APTT: aPTT: 30 s (ref 24–36)

## 2022-11-24 LAB — TSH: TSH: 1.031 u[IU]/mL (ref 0.350–4.500)

## 2022-11-24 MED ORDER — HEPARIN BOLUS VIA INFUSION
3300.0000 [IU] | Freq: Once | INTRAVENOUS | Status: AC
  Administered 2022-11-24: 3300 [IU] via INTRAVENOUS
  Filled 2022-11-24: qty 3300

## 2022-11-24 MED ORDER — ACETAMINOPHEN 650 MG RE SUPP: 650.0000 mg | Freq: Four times a day (QID) | RECTAL | Status: DC | PRN

## 2022-11-24 MED ORDER — SUMATRIPTAN SUCCINATE 50 MG PO TABS: 100.0000 mg | ORAL_TABLET | Freq: Every day | ORAL | Status: DC | PRN

## 2022-11-24 MED ORDER — PANTOPRAZOLE SODIUM 40 MG PO TBEC
80.0000 mg | DELAYED_RELEASE_TABLET | Freq: Every day | ORAL | Status: DC
  Administered 2022-11-26: 80 mg via ORAL
  Filled 2022-11-24: qty 2

## 2022-11-24 MED ORDER — IOHEXOL 350 MG/ML SOLN
100.0000 mL | Freq: Once | INTRAVENOUS | Status: AC | PRN
Start: 2022-11-24 — End: 2022-11-24
  Administered 2022-11-24: 100 mL via INTRAVENOUS

## 2022-11-24 MED ORDER — HEPARIN (PORCINE) 25000 UT/250ML-% IV SOLN
600.0000 [IU]/h | INTRAVENOUS | Status: DC
  Administered 2022-11-24: 700 [IU]/h via INTRAVENOUS
  Filled 2022-11-24: qty 250

## 2022-11-24 MED ORDER — ALPRAZOLAM 0.25 MG PO TABS: 0.1250 mg | ORAL_TABLET | Freq: Every day | ORAL | Status: DC | PRN

## 2022-11-24 MED ORDER — NITROGLYCERIN 0.4 MG SL SUBL
0.4000 mg | SUBLINGUAL_TABLET | SUBLINGUAL | Status: DC | PRN
  Administered 2022-11-24: 0.4 mg via SUBLINGUAL
  Filled 2022-11-24: qty 1

## 2022-11-24 MED ORDER — SODIUM CHLORIDE 0.9% FLUSH
10.0000 mL | Freq: Two times a day (BID) | INTRAVENOUS | Status: DC
  Administered 2022-11-24: 10 mL via INTRAVENOUS

## 2022-11-24 MED ORDER — MELATONIN 5 MG PO TABS
5.0000 mg | ORAL_TABLET | Freq: Every evening | ORAL | Status: DC | PRN
  Filled 2022-11-24: qty 1

## 2022-11-24 MED ORDER — ONDANSETRON HCL 4 MG/2ML IJ SOLN: 4.0000 mg | Freq: Four times a day (QID) | INTRAMUSCULAR | Status: DC | PRN

## 2022-11-24 MED ORDER — SENNOSIDES-DOCUSATE SODIUM 8.6-50 MG PO TABS
1.0000 | ORAL_TABLET | Freq: Every evening | ORAL | Status: DC | PRN
Start: 2022-11-24 — End: 2022-11-26

## 2022-11-24 MED ORDER — ASPIRIN 81 MG PO CHEW
81.0000 mg | CHEWABLE_TABLET | ORAL | Status: AC
  Administered 2022-11-25: 81 mg via ORAL
  Filled 2022-11-24: qty 1

## 2022-11-24 MED ORDER — ONDANSETRON HCL 4 MG PO TABS: 4.0000 mg | ORAL_TABLET | Freq: Four times a day (QID) | ORAL | Status: DC | PRN

## 2022-11-24 MED ORDER — MORPHINE SULFATE (PF) 2 MG/ML IV SOLN: 2.0000 mg | INTRAVENOUS | Status: DC | PRN

## 2022-11-24 MED ORDER — ASPIRIN 81 MG PO TBEC: 81.0000 mg | DELAYED_RELEASE_TABLET | Freq: Every day | ORAL | Status: DC

## 2022-11-24 MED ORDER — NITROGLYCERIN 2 % TD OINT: 1.0000 [in_us] | TOPICAL_OINTMENT | Freq: Four times a day (QID) | TRANSDERMAL | Status: DC | PRN

## 2022-11-24 MED ORDER — ACETAMINOPHEN 325 MG PO TABS
650.0000 mg | ORAL_TABLET | Freq: Four times a day (QID) | ORAL | Status: DC | PRN
  Administered 2022-11-25 (×2): 650 mg via ORAL
  Filled 2022-11-24 (×2): qty 2

## 2022-11-24 NOTE — ED Provider Notes (Signed)
5:04 PM Assumed care for off going team.   Blood pressure 125/70, pulse 81, temperature 98.7 F (37.1 C), resp. rate 16, last menstrual period 02/12/2013, SpO2 96%.  See their HPI for full report but in brief pednign CT read  We have attempted to call radiology multiple times trying to get a CT read for the dissection scan.  It is still pending.  Patient is already taken aspirin waiting to start heparin   I did repeat an EKG does not show any signs of STEMI at this time.  Is normal sinus rate 74 without any ST elevation and T wave inversions in 2 aVL 4 5 and 6, normal intervals  IMPRESSION:  1. No evidence for aortic aneurysm or dissection.  2. No acute localizing process in the chest, abdomen or pelvis.  3. Colonic diverticulosis without evidence for diverticulitis.  4. Uterine fibroid.  5. Incidental right thyroid nodule with heterogeneous and enlarged  thyroid measuring 2.2 cm. Recommend non-emergent thyroid ultrasound.  Reference: J Am Coll Radiol. 2015 Feb;12(2): 143-50    Aortic Atherosclerosis (ICD10-I70.0).     Incidental findings discussed with patient.  Heparin was started and patient was admitted to the hospitalist      Concha Se, MD 11/24/22 [phone]

## 2022-11-24 NOTE — Assessment & Plan Note (Addendum)
Comes in with chest pain - rates as 5/10.  States has been present since yesterday am.  Nothing she has taken has helped the pain.  EKG her revealed - SR with TWI in I and aVL and v5 and v6.  New changes when compared to previous EKG.  Given persistent chest pain and lateral EKG changes, discussed the need for further w/up and evaluation. Oxygen placed.  She was given 81mg  aspirin (x2).  EMS called.  Pt transferred to ER for further evaluation and treatment.

## 2022-11-24 NOTE — Hospital Course (Signed)
Shannon [name] is a 57 year old female with history of anxiety, GERD, who presents emergency department for chief concerns of chest pain.  Vitals in the ED showed temperature of 98.7, respiration rate of 16, heart rate of 75, blood pressure 120/70, SpO2 99% room air.  Serum sodium is 136, potassium 3.8, chloride 107, bicarb 20, BUN of 9, serum creatinine of 0.53, EGFR greater than 60, nonfasting blood glucose 77, WBC 9.5, hemoglobin 14.1, platelets of 261.  High sensitive troponin was initially 1798 and on repeat was 2596.  CTA CAP for dissection: Read as no evidence of aortic aneurysm or dissection.  No acute localizing process in the chest, abdomen, pelvis.  ED treatment: Heparin GTT per pharmacy.  Cardiology was consulted, tentative plan for left heart cath in the a.m.

## 2022-11-24 NOTE — ED Provider Notes (Signed)
[facility] Provider Note    Event Date/Time   First MD Initiated Contact with Patient 11/24/22 1318     (approximate)   History   Chief Complaint: Chest Pain   HPI  Shannon [name] is a 57 y.o. female with a history of GERD who comes ED complaining of epigastric pain radiating to the back and bilateral shoulders and down bilateral arms that started yesterday morning.  Thought it was GERD, took her Nexium without relief.  Constant throughout the day.  Not exertional, not pleuritic.  No shortness of breath or vomiting or diaphoresis.  Continued worsening, and this morning was severe so she went to her doctor who obtained EKG, called EMS.  Patient has received aspirin, nitroglycerin, reports pain is improved to 3/10 currently.     Physical Exam   Triage Vital Signs: ED Triage Vitals  Encounter Vitals Group     BP 11/24/22 1242 130/85     Systolic BP Percentile --      Diastolic BP Percentile --      Pulse Rate 11/24/22 1242 83     Resp 11/24/22 1242 17     Temp 11/24/22 1242 98.7 F (37.1 C)     Temp src --      SpO2 11/24/22 1242 100 %     Weight --      Height --      Head Circumference --      Peak Flow --      Pain Score 11/24/22 1229 4     Pain Loc --      Pain Education --      Exclude from Growth Chart --     Most recent vital signs: Vitals:   11/26/22 0410 11/26/22 0828  BP: 101/65 102/69  Pulse: (!) 59 72  Resp: 18 16  Temp: 98.1 F (36.7 C) 97.8 F (36.6 C)  SpO2: 98% 100%    General: Awake, no distress.  CV:  Good peripheral perfusion.  Regular rate and rhythm Resp:  Normal effort.  Clear to auscultation bilaterally Abd:  No distention.  Soft nontender Other:  No lower extremity edema   ED Results / Procedures / Treatments   Labs (all labs ordered are listed, but only abnormal results are displayed) Labs Reviewed  BASIC METABOLIC PANEL - Abnormal; Notable for the following components:      Result Value   CO2 20 (*)     Calcium 8.2 (*)    All other components within normal limits  LIPID PANEL - Abnormal; Notable for the following components:   Cholesterol 201 (*)    LDL Cholesterol 101 (*)    All other components within normal limits  HEPARIN LEVEL (UNFRACTIONATED) - Abnormal; Notable for the following components:   Heparin Unfractionated 0.87 (*)    All other components within normal limits  BASIC METABOLIC PANEL - Abnormal; Notable for the following components:   Calcium 8.8 (*)    All other components within normal limits  GLUCOSE, CAPILLARY - Abnormal; Notable for the following components:   Glucose-Capillary 100 (*)    All other components within normal limits  GLUCOSE, CAPILLARY - Abnormal; Notable for the following components:   Glucose-Capillary 111 (*)    All other components within normal limits  TROPONIN I (HIGH SENSITIVITY) - Abnormal; Notable for the following components:   Troponin I (High Sensitivity) 1,798 (*)    All other components within normal limits  TROPONIN I (HIGH SENSITIVITY) - Abnormal;  Notable for the following components:   Troponin I (High Sensitivity) 2,596 (*)    All other components within normal limits  CBC  TSH  APTT  PROTIME-INR  BASIC METABOLIC PANEL  CBC  HEPARIN LEVEL (UNFRACTIONATED)  CBC  LIPOPROTEIN A (LPA)  MAGNESIUM     EKG Interpreted by me Sinus rhythm rate of 81.  Normal axis and intervals.  Normal QRS and ST segments.  Lateral T wave inversions.  Compared to prior EKG on May 02, 2014, T wave inversions are new.   RADIOLOGY Chest x-ray interpreted by me, appears normal.  Radiology report reviewed   PROCEDURES:  .Critical Care  Performed by: Sharman Cheek, MD Authorized by: Sharman Cheek, MD   Critical care provider statement:    Critical care time (minutes):  35   Critical care time was exclusive of:  Separately billable procedures and treating other patients   Critical care was necessary to treat or prevent imminent or  life-threatening deterioration of the following conditions:  Cardiac failure and circulatory failure   Critical care was time spent personally by me on the following activities:  Development of treatment plan with patient or surrogate, discussions with consultants, evaluation of patient's response to treatment, examination of patient, obtaining history from patient or surrogate, ordering and performing treatments and interventions, ordering and review of laboratory studies, ordering and review of radiographic studies, pulse oximetry, re-evaluation of patient's condition and review of old charts    MEDICATIONS ORDERED IN ED: Medications  labetalol (NORMODYNE) injection 10 mg (has no administration in time range)  hydrALAZINE (APRESOLINE) injection 10 mg (has no administration in time range)  0.9 %  sodium chloride infusion (0 mLs Intravenous Stopped 11/25/22 1839)  iohexol (OMNIPAQUE) 350 MG/ML injection 100 mL (100 mLs Intravenous Contrast Given 11/24/22 1439)  aspirin chewable tablet 81 mg (81 mg Oral Given 11/25/22 0639)  heparin bolus via infusion 3,300 Units (3,300 Units Intravenous Bolus from Bag 11/24/22 1809)     IMPRESSION / MDM / ASSESSMENT AND PLAN / ED COURSE  I reviewed the triage vital signs and the nursing notes.  DDx: NSTEMI, aortic dissection, pneumothorax, GERD.  Doubt PE or intra-abdominal pathology.  Patient's presentation is most consistent with acute presentation with potential threat to life or bodily function.  Patient presents with epigastric pain radiating to the back and shoulders.  No exertional symptoms.  Constant since yesterday, improved with nitroglycerin given today.  Abdomen is benign.  EKG shows lateral T wave inversions.  Troponin markedly elevated at 1800.  Will need CT angiogram to rule out dissection to safely start heparin.  Will need to admit for further cardiology evaluation.       FINAL CLINICAL IMPRESSION(S) / ED DIAGNOSES   Final diagnoses:   NSTEMI (non-ST elevated myocardial infarction) [facility])     Rx / DC Orders   ED Discharge Orders          Ordered    aspirin EC 81 MG tablet  Daily        11/26/22 1152    atorvastatin (LIPITOR) 20 MG tablet  Daily        11/26/22 1152    empagliflozin (JARDIANCE) 10 MG TABS tablet  Daily        11/26/22 1152    Increase activity slowly        11/26/22 1152    Diet - low sodium heart healthy        11/26/22 1152    Discharge instructions  Comments: It was pleasure taking care of you. You have been started on p.o. new medicine, I baby aspirin and a cholesterol pill, please take them as directed. Keep yourself well-hydrated and follow-up closely with your cardiologist as they will add more medications once your blood pressure improves and allows. You are also being given a referral to participate in cardiac rehab. Follow-up with your doctors closely for further recommendations.   11/26/22 1152    (HEART FAILURE PATIENTS) Call MD:  Anytime you have any of the following symptoms: 1) 3 pound weight gain in 24 hours or 5 pounds in 1 week 2) shortness of breath, with or without a dry hacking cough 3) swelling in the hands, feet or stomach 4) if you have to sleep on extra pillows at night in order to breathe.        11/26/22 1152    AMB referral to Phase II Cardiac Rehabilitation        11/25/22 1535             Note:  This document was prepared using Dragon voice recognition software and may include unintentional dictation errors.   Sharman Cheek, MD 11/28/22 1336

## 2022-11-24 NOTE — Assessment & Plan Note (Signed)
Home PPI equivalent resumed

## 2022-11-24 NOTE — H&P (Addendum)
History and Physical   Shannon [name] ZOX:[id number] DOB: 11-24-1965 DOA: 11/24/2022  PCP: Dale Bandera, MD  Patient coming from: PCP via EMS  I have personally briefly reviewed patient's old medical records in [facility] EMR.  Chief Concern: Chest pain  HPI: [name] is a 57 year old female with history of anxiety, GERD, who presents emergency department for chief concerns of chest pain.  Vitals in the ED showed temperature of 98.7, respiration rate of 16, heart rate of 75, blood pressure 120/70, SpO2 99% room air.  Serum sodium is 136, potassium 3.8, chloride 107, bicarb 20, BUN of 9, serum creatinine of 0.53, EGFR greater than 60, nonfasting blood glucose 77, WBC 9.5, hemoglobin 14.1, platelets of 261.  High sensitive troponin was initially 1798 and on repeat was 2596.  CTA CAP for dissection: Read as no evidence of aortic aneurysm or dissection.  No acute localizing process in the chest, abdomen, pelvis.  ED treatment: Heparin GTT per pharmacy.  Cardiology was consulted, tentative plan for left heart cath in the a.m. -------------------------------- At bedside, she is able to tell me her name, age, location, current calendar year.   She reports she was sitting at church, approximately 10am, when she developed chest pain (epigastric area), and pressure with bilateral arm pains. She had pain radiating to her back. She had some chest pressure while in the car on her way to church yesterday.   She reports she frequently walks 10,000-15,000 steps per day. And she last walked 10,000 steps on Saturday, 10/26. She goes to the gym twice per week. She denies trauma to her person. She denies having any shortness of breath, epigastric pain or chest pain over the last few days, weeks, months. She reports she has never felt like she did on Sunday.  She denies fever, chills, nausea, vomiting, abdominal pain. She had 5 episodes of watery diarrhea yesterday, light brown. She denies  diarrhea today.   She endorses increased stress lately.  A niece recently had a stroke at the age of 43.  Her husband has had back problems and has been mostly bedbound.  And their dog just died.  Family history: Father: cad s/p stents and triple bypass (2010), in his late 77s.   Social history: She lives with her husband, 29 years. She denies tobacco use. She is formerly smoked, 1/2 ppd, quit 20+ years ago. She denies etoh and recreational drug use. She works for [employer].   ROS: Constitutional: no weight change, no fever ENT/Mouth: no sore throat, no rhinorrhea Eyes: no eye pain, no vision changes Cardiovascular: + chest pain, no dyspnea,  no edema, no palpitations Respiratory: no cough, no sputum, no wheezing Gastrointestinal: no nausea, no vomiting, no diarrhea, no constipation Genitourinary: no urinary incontinence, no dysuria, no hematuria Musculoskeletal: no arthralgias, no myalgias Skin: no skin lesions, no pruritus, Neuro: no weakness, no loss of consciousness, no syncope Psych: no anxiety, no depression, no decrease appetite Heme/Lymph: no bruising, no bleeding  ED Course: Discussed with emergency medicine provider, patient requiring hospitalization for chief concerns of NSTEMI.  Assessment/Plan  Principal Problem:   NSTEMI (non-ST elevated myocardial infarction) (HCC) Active Problems:   Tobacco abuse   GERD (gastroesophageal reflux disease)   Migraine   Anxiety   Gastroesophageal reflux disease without esophagitis   Osteopenia   Assessment and Plan:  * NSTEMI (non-ST elevated myocardial infarction) (HCC) Continue heparin per pharmacy As needed nitroglycerin ointment every 6 hours for chest pain, 1 day ordered Heart healthy diet;  n.p.o. after midnight Anticipate left heart cath in the a.m. Echo ordered per cardiology team Admit to telemetry cardiac, inpatient  Tobacco abuse Patient quit 20+ years ago  Gastroesophageal reflux disease without  esophagitis Home PPI equivalent resumed  Anxiety Home alprazolam 0.125 mg daily prn for anxiety resumed  Migraine Home sumatriptan not resumed on admission pending LHC. Sumatriptan is contraindicated in patients with CAD  Chart reviewed.   DVT prophylaxis: Heparin per pharmacy Code Status: full code Diet: Heart healthy; n.p.o. after midnight Family Communication: Updated daughter at bedside with patient's permission.  Disposition Plan: Pending clinical course, left heart cath in a.m. Consults called: [facility] cardiology via EDP Admission status: Telemetry cardiac, inpatient  Past Medical History:  Diagnosis Date   Anemia    Anxiety    Breast pain, right 11/07/2012   Breathing difficulty 06/20/2014   Environmental allergies    GERD (gastroesophageal reflux disease)    Hiatal hernia    small   History of migraine headaches    Hyperthyroidism    s/p ablation   Vaginitis 01/16/2013   Past Surgical History:  Procedure Laterality Date   BREAST BIOPSY Right 12/05/2015   neg   CESAREAN SECTION  11/98   Dr Colletta [place]   CHOLECYSTECTOMY  11/05   Dr Michela Pitcher   JOINT REPLACEMENT Left 2017   hip   NASAL SINUS SURGERY  11/93   SEPTOPLASTY  5/90   Dr Esmeralda Links   TUBAL LIGATION  02/1998   UMBILICAL HERNIA REPAIR  02/1998   Social History:  reports that she quit smoking about 22 years ago. Her smoking use included cigarettes. She has never used smokeless tobacco. She reports that she does not drink alcohol and does not use drugs.  Allergies  Allergen Reactions   Codeine Other (See Comments)    Severe Headache   Decongest-Aid [Pseudoephedrine] Other (See Comments)    Increased heart rate   Flagyl [Metronidazole]    Sulfa Antibiotics    Family History  Problem Relation Age of Onset   Heart disease Father    Hypertension Father    Thyroid disease Father    Hypercholesterolemia Father    Kidney cancer Father    Leukemia Father        hairy cell   Hypercholesterolemia Mother     Thyroid disease Mother    Breast cancer Mother 46   Breast cancer Other        maternal great grandmother   Heart disease Maternal Grandfather        MI - age 43   Prostate cancer Paternal Grandfather    Pancreatic cancer Paternal Grandfather    Lung cancer Paternal Grandfather    Bladder Cancer Neg Hx    Family history: Family history reviewed and not pertinent.  Prior to Admission medications   Medication Sig Start Date End Date Taking? Authorizing Provider  acetaminophen (TYLENOL) 500 MG tablet Take 1,000 mg by mouth every 6 (six) hours as needed for moderate pain (pain score 4-6) or mild pain (pain score 1-3).   Yes [provider]  ALPRAZolam (XANAX) 0.25 MG tablet TAKE ONE TABLET DAILY IF NEEDED FOR ANXIETY Patient taking differently: Take 0.125 mg by mouth daily as needed for anxiety. 04/04/21  Yes Dale Winchester, MD  cholecalciferol (VITAMIN D3) 25 MCG (1000 UNIT) tablet Take 1,000 Units by mouth daily.   Yes [provider]  Cyanocobalamin 1000 MCG/ML KIT Inject 1,000 mcg as directed every 30 (thirty) days.   Yes [provider]  EPINEPHrine 0.3 mg/0.3 mL IJ SOAJ injection Inject 0.3 mg into the muscle as needed for anaphylaxis.   Yes [provider]  esomeprazole (GNP ESOMEPRAZOLE MAGNESIUM) 20 MG capsule Take 40 mg by mouth in the morning.   Yes [provider]  ondansetron (ZOFRAN-ODT) 4 MG disintegrating tablet Take 1 tablet (4 mg total) by mouth every 6 (six) hours as needed for nausea or vomiting. 04/23/22  Yes Ward, Layla Maw, DO  SUMAtriptan (IMITREX) 100 MG tablet Take 100 mg by mouth daily as needed for migraine.   Yes [provider]   Physical Exam: Vitals:   11/24/22 1430 11/24/22 1500 11/24/22 1732 11/24/22 1800  BP: (!) 141/77 125/70 126/80 122/85  Pulse: 82 81 74 88  Resp: 17 16 14 18   Temp:    98.4 F (36.9 C)  TempSrc:    Oral  SpO2: 100% 96% 99% 100%   Constitutional: appears age appropriate, NAD,  calm Eyes: PERRL, lids and conjunctivae normal ENMT: Mucous membranes are moist. Posterior pharynx clear of any exudate or lesions. Age-appropriate dentition. Hearing appropriate Neck: normal, supple, no masses, no thyromegaly Respiratory: clear to auscultation bilaterally, no wheezing, no crackles. Normal respiratory effort. No accessory muscle use.  Cardiovascular: Regular rate and rhythm, no murmurs / rubs / gallops. No extremity edema. 2+ pedal pulses. No carotid bruits.  Abdomen: no tenderness, no masses palpated, no hepatosplenomegaly. Bowel sounds positive.  Musculoskeletal: no clubbing / cyanosis. No joint deformity upper and lower extremities. Good ROM, no contractures, no atrophy. Normal muscle tone.  Skin: no rashes, lesions, ulcers. No induration Neurologic: Sensation intact. Strength 5/5 in all 4.  Psychiatric: Normal judgment and insight. Alert and oriented x 3. Normal mood.   EKG: independently reviewed, showing sinus rhythm with rate of 74, QTc 472  Chest x-ray on Admission: I personally reviewed and I agree with radiologist reading as below.  CT Angio Chest/Abd/Pel for Dissection W and/or Wo Contrast  Result Date: 11/24/2022 CLINICAL DATA:  Acute aortic syndrome suspected EXAM: CT ANGIOGRAPHY CHEST, ABDOMEN AND PELVIS TECHNIQUE: Non-contrast CT of the chest was initially obtained. Multidetector CT imaging through the chest, abdomen and pelvis was performed using the standard protocol during bolus administration of intravenous contrast. Multiplanar reconstructed images and MIPs were obtained and reviewed to evaluate the vascular anatomy. RADIATION DOSE REDUCTION: This exam was performed according to the departmental dose-optimization program which includes automated exposure control, adjustment of the mA and/or kV according to patient size and/or use of iterative reconstruction technique. CONTRAST:  OMNIPAQUE IOHEXOL 350 MG/ML SOLN COMPARISON:  CT abdomen and pelvis  04/23/2022 FINDINGS: CTA CHEST FINDINGS Cardiovascular: Preferential opacification of the thoracic aorta. No evidence of thoracic aortic aneurysm or dissection. Normal heart size. No pericardial effusion. There is mild calcified atherosclerotic disease throughout the aorta. Mediastinum/Nodes: The thyroid gland is diffusely heterogeneous. The right thyroid is enlarged there is a posterior thyroid nodule measuring 2.2 x 1.6 cm on the right. There are no other enlarged mediastinal or hilar lymph nodes. The esophagus is within normal limits. Lungs/Pleura: Lungs are clear. No pleural effusion or pneumothorax. Musculoskeletal: No chest wall abnormality. No acute or significant osseous findings. Review of the MIP images confirms the above findings. CTA ABDOMEN AND PELVIS FINDINGS VASCULAR Aorta: Normal caliber aorta without aneurysm, dissection, vasculitis or significant stenosis. There is mild calcified atherosclerotic disease throughout the aorta. Celiac: Patent without evidence of aneurysm, dissection, vasculitis or significant stenosis. SMA: Patent without evidence of aneurysm, dissection, vasculitis or significant stenosis. Renals: Both  renal arteries are patent without evidence of aneurysm, dissection, vasculitis, fibromuscular dysplasia or significant stenosis. IMA: Patent without evidence of aneurysm, dissection, vasculitis or significant stenosis. Inflow: Patent without evidence of aneurysm, dissection, vasculitis or significant stenosis. Veins: No obvious venous abnormality within the limitations of this arterial phase study. Review of the MIP images confirms the above findings. NON-VASCULAR Hepatobiliary: No focal liver abnormality is seen. Status post cholecystectomy. No biliary dilatation. Pancreas: Unremarkable. No pancreatic ductal dilatation or surrounding inflammatory changes. Spleen: Normal in size without focal abnormality. Adrenals/Urinary Tract: Adrenal glands are unremarkable. Kidneys are normal,  without renal calculi, focal lesion, or hydronephrosis. Bladder is unremarkable. Stomach/Bowel: Stomach is within normal limits. Appendix appears normal. No evidence of bowel wall thickening, distention, or inflammatory changes. There are scattered colonic diverticula. Lymphatic: No enlarged lymph nodes are seen. Reproductive: There is a posterior calcified uterine fibroid measuring 3.2 x 1.8 cm, unchanged. Adnexa are within normal limits. Other: No abdominal wall hernia or abnormality. No abdominopelvic ascites. Musculoskeletal: Left hip arthroplasty is present. Review of the MIP images confirms the above findings. IMPRESSION: 1. No evidence for aortic aneurysm or dissection. 2. No acute localizing process in the chest, abdomen or pelvis. 3. Colonic diverticulosis without evidence for diverticulitis. 4. Uterine fibroid. 5. Incidental right thyroid nodule with heterogeneous and enlarged thyroid measuring 2.2 cm. Recommend non-emergent thyroid ultrasound. Reference: J Am Coll Radiol. 2015 Feb;12(2): 143-50 Aortic Atherosclerosis (ICD10-I70.0). Electronically Signed   By: Darliss Cheney M.D.   On: 11/24/2022 17:26   DG Chest 2 View  Result Date: 11/24/2022 CLINICAL DATA:  Chest pain. EXAM: CHEST - 2 VIEW COMPARISON:  05/02/2014. FINDINGS: Bilateral lung fields are clear. Bilateral costophrenic angles are clear. Normal cardio-mediastinal silhouette. No acute osseous abnormalities. The soft tissues are within normal limits. There are surgical clips in the right upper quadrant, typical of a previous cholecystectomy. IMPRESSION: No active cardiopulmonary disease. Electronically Signed   By: Jules Schick M.D.   On: 11/24/2022 13:57    Labs on Admission: I have personally reviewed following labs  CBC: Recent Labs  Lab 11/24/22 1231  WBC 9.5  HGB 14.1  HCT 43.3  MCV 92.5  PLT 261   Basic Metabolic Panel: Recent Labs  Lab 11/24/22 1231  NA 136  K 3.8  CL 107  CO2 20*  GLUCOSE 77  BUN 9   CREATININE 0.53  CALCIUM 8.2*   GFR: Estimated Creatinine Clearance: 59.9 mL/min (by C-G formula based on SCr of 0.53 mg/dL).  Coagulation Profile: Recent Labs  Lab 11/24/22 1717  INR 1.0   Thyroid Function Tests: Recent Labs    11/24/22 1432  TSH 1.031   Anemia Panel: No results for input(s): "VITAMINB12", "FOLATE", "FERRITIN", "TIBC", "IRON", "RETICCTPCT" in the last 72 hours.  Urine analysis:    Component Value Date/Time   COLORURINE YELLOW (A) 04/23/2022 0342   APPEARANCEUR HAZY (A) 04/23/2022 0342   APPEARANCEUR Clear 06/29/2020 1054   LABSPEC 1.020 04/23/2022 0342   PHURINE 5.0 04/23/2022 0342   GLUCOSEU NEGATIVE 04/23/2022 0342   HGBUR SMALL (A) 04/23/2022 0342   BILIRUBINUR NEGATIVE 04/23/2022 0342   BILIRUBINUR Negative 06/29/2020 1054   KETONESUR NEGATIVE 04/23/2022 0342   PROTEINUR NEGATIVE 04/23/2022 0342   UROBILINOGEN 0.2 05/07/2020 1014   NITRITE NEGATIVE 04/23/2022 0342   LEUKOCYTESUR NEGATIVE 04/23/2022 0342   This document was prepared using Dragon Voice Recognition software and may include unintentional dictation errors.  Dr. Sedalia Muta [facility]  If 7PM-7AM, please contact overnight-coverage provider If 7AM-7PM, please  contact day attending provider [email or web address]  11/24/2022, 8:26 PM

## 2022-11-24 NOTE — ED Notes (Signed)
MD Scotty Court made aware of patients troponin 1,798 relayed by lab

## 2022-11-24 NOTE — H&P (View-Only) (Signed)
Cardiology Consultation   Patient ID: Shannon [name] MRN: [id number]; DOB: 1965/02/24  Admit date: 11/24/2022 Date of Consult: 11/24/2022  PCP:  Dale Big Chimney, MD   [facility] Providers Cardiologist:  None      New consult completed by Dr Kirke Corin  Patient Profile:   Shannon [name] is a 57 y.o. female with a hx of anxiety, GERD, hiatal hernia, hyperthyroidism s/p ablation, anemia, who is being seen 11/24/2022 for the evaluation of NSTEMI at the request of Dr Scotty Court.  History of Present Illness:   Shannon [name] with no prior cardiac history but a family history of coronary artery disease with her father starting with multiple heart catheterizations, stent placements, and CABG starting in his mid 33s to early 5s.   She  presented to the [facility] emergency department today via EMS from her PCP office with the complaint of chest pain.  At the office EKG revealed sinus rhythm with T wave inversions in inferior lateral leads compared to prior EKG.  She was having persistent chest pain she described as indigestion.  She was placed on oxygen and given to 81 mg aspirin.  EMS arrived and she was given 2 more additional baby aspirin's 1 spray of nitroglycerin and continued to have pain rated 4 out of 10 on the pain scale.  She states that her discomfort is epigastric and runs up her chest and into her back and shoulders.  She stated that the pain started yesterday morning.  Initially she thought it was her acid reflux but took Nexium without relief.  It has been constant throughout the day.  It is not related with rest or exertional.  She denies any other associated symptoms.  Initial vital signs: Blood pressure 130/85, pulse of 83, respirations of 17, temperature 98.7  Pertinent labs: CO2 20, calcium 8.2, high-sensitivity troponin 1798, 2596, unremarkable CBC  Imaging: Chest x-ray reveals no active cardiopulmonary disease; CT angio of the chest/abdomen/pelvis pending report  Cardiology  was consulted by ER provider Dr. Scotty Court for continued evaluation of NSTEMI.   Past Medical History:  Diagnosis Date   Anemia    Anxiety    Breast pain, right 11/07/2012   Breathing difficulty 06/20/2014   Environmental allergies    GERD (gastroesophageal reflux disease)    Hiatal hernia    small   History of migraine headaches    Hyperthyroidism    s/p ablation   Vaginitis 01/16/2013    Past Surgical History:  Procedure Laterality Date   BREAST BIOPSY Right 12/05/2015   neg   CESAREAN SECTION  11/98   Dr Colletta [place]   CHOLECYSTECTOMY  11/05   Dr Michela Pitcher   JOINT REPLACEMENT Left 2017   hip   NASAL SINUS SURGERY  11/93   SEPTOPLASTY  5/90   Dr Esmeralda Links   TUBAL LIGATION  02/1998   UMBILICAL HERNIA REPAIR  02/1998     Home Medications:  Prior to Admission medications   Medication Sig Start Date End Date Taking? Authorizing Provider  acetaminophen (TYLENOL) 500 MG tablet Take 1,000 mg by mouth every 6 (six) hours as needed for moderate pain (pain score 4-6) or mild pain (pain score 1-3).   Yes [provider]  ALPRAZolam (XANAX) 0.25 MG tablet TAKE ONE TABLET DAILY IF NEEDED FOR ANXIETY Patient taking differently: Take 0.125 mg by mouth daily as needed for anxiety. 04/04/21  Yes Dale Stafford, MD  cholecalciferol (VITAMIN D3) 25 MCG (1000 UNIT) tablet Take 1,000 Units by mouth daily.  Yes [provider]  Cyanocobalamin 1000 MCG/ML KIT Inject 1,000 mcg as directed every 30 (thirty) days.   Yes [provider]  EPINEPHrine 0.3 mg/0.3 mL IJ SOAJ injection Inject 0.3 mg into the muscle as needed for anaphylaxis.   Yes [provider]  esomeprazole (GNP ESOMEPRAZOLE MAGNESIUM) 20 MG capsule Take 40 mg by mouth in the morning.   Yes [provider]  ondansetron (ZOFRAN-ODT) 4 MG disintegrating tablet Take 1 tablet (4 mg total) by mouth every 6 (six) hours as needed for nausea or vomiting. 04/23/22  Yes Ward, Layla Maw, DO  SUMAtriptan  (IMITREX) 100 MG tablet Take 100 mg by mouth daily as needed for migraine.   Yes [provider]    Inpatient Medications: Scheduled Meds:  [START ON 11/25/2022] aspirin EC  81 mg Oral Daily   Continuous Infusions:  PRN Meds: nitroGLYCERIN  Allergies:    Allergies  Allergen Reactions   Codeine Other (See Comments)    Severe Headache   Decongest-Aid [Pseudoephedrine] Other (See Comments)    Increased heart rate   Flagyl [Metronidazole]    Sulfa Antibiotics     Social History:   Social History   Socioeconomic History   Marital status: Married    Spouse name: Not on file   Number of children: 1   Years of education: Not on file   Highest education level: Not on file  Occupational History   Not on file  Tobacco Use   Smoking status: Former    Current packs/day: 0.00    Types: Cigarettes    Quit date: 01/28/2000    Years since quitting: 22.8   Smokeless tobacco: Never  Substance and Sexual Activity   Alcohol use: No    Alcohol/week: 0.0 standard drinks of alcohol   Drug use: No   Sexual activity: Not on file  Other Topics Concern   Not on file  Social History Narrative   Not on file   Social Determinants of Health   Financial Resource Strain: Not on file  Food Insecurity: Not on file  Transportation Needs: Not on file  Physical Activity: Not on file  Stress: Not on file  Social Connections: Not on file  Intimate Partner Violence: Not on file    Family History:    Family History  Problem Relation Age of Onset   Heart disease Father    Hypertension Father    Thyroid disease Father    Hypercholesterolemia Father    Kidney cancer Father    Leukemia Father        hairy cell   Hypercholesterolemia Mother    Thyroid disease Mother    Breast cancer Mother 20   Breast cancer Other        maternal great grandmother   Heart disease Maternal Grandfather        MI - age 3   Prostate cancer Paternal Grandfather    Pancreatic cancer Paternal  Grandfather    Lung cancer Paternal Grandfather    Bladder Cancer Neg Hx      ROS:  Please see the history of present illness.  Review of Systems  Constitutional:  Positive for malaise/fatigue.  Cardiovascular:  Positive for chest pain.  Gastrointestinal:  Positive for heartburn.    All other ROS reviewed and negative.     Physical Exam/Data:   Vitals:   11/24/22 1242 11/24/22 1430 11/24/22 1500  BP: 130/85 (!) 141/77 125/70  Pulse: 83 82 81  Resp: 17 17 16  Temp: 98.7 F (37.1 C)    SpO2: 100% 100% 96%   No intake or output data in the 24 hours ending 11/24/22 1707    11/24/2022   11:09 AM 09/24/2022    2:09 PM 08/07/2022    3:35 PM  Last 3 Weights  Weight (lbs) 124 lb 123 lb 3.2 oz 124 lb 9.6 oz  Weight (kg) 56.246 kg 55.883 kg 56.518 kg     There is no height or weight on file to calculate BMI.  General:  Well nourished, well developed, in no acute distress HEENT: normal Neck: no JVD Vascular: No carotid bruits; Distal pulses 2+ bilaterally Cardiac:  normal S1, S2; RRR; no murmur  Lungs:  clear to auscultation bilaterally, no wheezing, rhonchi or rales  Abd: soft, nontender, no hepatomegaly  Ext: no edema Musculoskeletal:  No deformities, BUE and BLE strength normal and equal Skin: warm and dry  Neuro:  CNs 2-12 intact, no focal abnormalities noted Psych:  Normal affect   EKG:  The EKG was personally reviewed and demonstrates: Sinus rhythm with rate 81 with ST depression T wave inversion in anterior lateral and lateral leads Telemetry:  Telemetry was personally reviewed and demonstrates: Sinus with rates in the 80s  Relevant CV Studies: Echocardiogram ordered and pending  Laboratory Data:  High Sensitivity Troponin:   Recent Labs  Lab 11/24/22 1231 11/24/22 1432  TROPONINIHS 1,798* 2,596*     Chemistry Recent Labs  Lab 11/24/22 1231  NA 136  K 3.8  CL 107  CO2 20*  GLUCOSE 77  BUN 9  CREATININE 0.53  CALCIUM 8.2*  GFRNONAA >60  ANIONGAP 9     No results for input(s): "PROT", "ALBUMIN", "AST", "ALT", "ALKPHOS", "BILITOT" in the last 168 hours. Lipids No results for input(s): "CHOL", "TRIG", "HDL", "LABVLDL", "LDLCALC", "CHOLHDL" in the last 168 hours.  Hematology Recent Labs  Lab 11/24/22 1231  WBC 9.5  RBC 4.68  HGB 14.1  HCT 43.3  MCV 92.5  MCH 30.1  MCHC 32.6  RDW 12.2  PLT 261   Thyroid No results for input(s): "TSH", "FREET4" in the last 168 hours.  BNPNo results for input(s): "BNP", "PROBNP" in the last 168 hours.  DDimer No results for input(s): "DDIMER" in the last 168 hours.   Radiology/Studies:  DG Chest 2 View  Result Date: 11/24/2022 CLINICAL DATA:  Chest pain. EXAM: CHEST - 2 VIEW COMPARISON:  05/02/2014. FINDINGS: Bilateral lung fields are clear. Bilateral costophrenic angles are clear. Normal cardio-mediastinal silhouette. No acute osseous abnormalities. The soft tissues are within normal limits. There are surgical clips in the right upper quadrant, typical of a previous cholecystectomy. IMPRESSION: No active cardiopulmonary disease. Electronically Signed   By: Jules Schick M.D.   On: 11/24/2022 13:57     Assessment and Plan:   NSTEMI -Presented with chest pain and EKG changes from PCP office -High-sensitivity troponins trended 1798 and 2596 -Pending results of CT chest/abdomen/pelvis to start heparin infusion -Continue aspirin 81 mg daily, received 4,  81 mg ASA en route to ED -Continue with telemetry monitoring -EKG as needed for pain or changes -EKG concerning for changes with lateral and anterior lateral T wave inversions new from prior studies -Currently chest pain-free after received nitroglycerin   -Lipid panel pending will start statin -Echocardiogram ordered and pending with further recommendations to follow -N.p.o. after midnight -Tentative left heart catheterization tomorrow with Dr. Okey Dupre with further recommendations to follow  GERD -recommend continuing PPI  therapy  Hyperthyroidism -Status post  ablation -TSH ordered   Risk Assessment/Risk Scores:     TIMI Risk Score for Unstable Angina or Non-ST Elevation MI:   The patient's TIMI risk score is 4, which indicates a 20% risk of all cause mortality, new or recurrent myocardial infarction or need for urgent revascularization in the next 14 days.          For questions or updates, please contact [facility] Please consult [email or web address] for contact info under    Signed, Saachi Zale, NP  11/24/2022 5:07 PM

## 2022-11-24 NOTE — Consult Note (Signed)
PHARMACY - ANTICOAGULATION CONSULT NOTE  Pharmacy Consult for heparin infusion Indication: ACS/NSTEMI  Allergies  Allergen Reactions   Codeine Other (See Comments)    Severe Headache   Decongest-Aid [Pseudoephedrine] Other (See Comments)    Increased heart rate   Flagyl [Metronidazole]    Sulfa Antibiotics     Patient Measurements:   Heparin Dosing Weight: 56.2 kg  Vital Signs: Temp: 98.7 F (37.1 C) (10/28 1242) BP: 125/70 (10/28 1500) Pulse Rate: 81 (10/28 1500)  Labs: Recent Labs    11/24/22 1231 11/24/22 1432  HGB 14.1  --   HCT 43.3  --   PLT 261  --   CREATININE 0.53  --   TROPONINIHS 1,798* 2,596*    Estimated Creatinine Clearance: 59.9 mL/min (by C-G formula based on SCr of 0.53 mg/dL).   Medical History: Past Medical History:  Diagnosis Date   Anemia    Anxiety    Breast pain, right 11/07/2012   Breathing difficulty 06/20/2014   Environmental allergies    GERD (gastroesophageal reflux disease)    Hiatal hernia    small   History of migraine headaches    Hyperthyroidism    s/p ablation   Vaginitis 01/16/2013    Medications:  No home anticoagulants per pharmacist review  Assessment: 57 yo female presented to the ED with chief complaint of chest pain.  In ED found to have elevated troponin.  Pharmacy consulted to start heparin infusion.  Goal of Therapy:  Heparin level 0.3-0.7 units/ml Monitor platelets by anticoagulation protocol: Yes   Plan:  Give 3300 units bolus x 1 Start heparin infusion at 700 units/hr Check anti-Xa level in 6 hours and daily while on heparin Continue to monitor H&H and platelets  Barrie Folk, PharmD 11/24/2022,4:58 PM

## 2022-11-24 NOTE — Progress Notes (Signed)
Subjective:    Patient ID: Shannon [name], female    DOB: 1965/04/12, 57 y.o.   MRN: [id number]  Patient here for  Chief Complaint  Patient presents with   Abdominal Pain    Abdominal Pain Pertinent negatives include no dysuria, headaches, myalgias or nausea.   Work in appt.  Woke up yesterday am with increased upper abdominal pain/chest pain.  Pain hurts into her back.  Has been constant pain.  Nothing has alleviated the pain.  Has been taking nexium, gas x, drinking carbonated drink.  Belching does alleviate for a brief period and then pain returns.  No nuasea or vomiting.  Has had three soft bowel movements this am and three soft bowel movements yesterday.  No actual diarrhea.  No constipation.     Past Medical History:  Diagnosis Date   Anemia    Anxiety    Breast pain, right 11/07/2012   Breathing difficulty 06/20/2014   Environmental allergies    GERD (gastroesophageal reflux disease)    Hiatal hernia    small   History of migraine headaches    Hyperthyroidism    s/p ablation   Vaginitis 01/16/2013   Past Surgical History:  Procedure Laterality Date   BREAST BIOPSY Right 12/05/2015   neg   CESAREAN SECTION  11/98   Dr Colletta [place]   CHOLECYSTECTOMY  11/05   Dr Michela Pitcher   JOINT REPLACEMENT Left 2017   hip   NASAL SINUS SURGERY  11/93   SEPTOPLASTY  5/90   Dr Esmeralda Links   TUBAL LIGATION  02/1998   UMBILICAL HERNIA REPAIR  02/1998   Family History  Problem Relation Age of Onset   Heart disease Father    Hypertension Father    Thyroid disease Father    Hypercholesterolemia Father    Kidney cancer Father    Leukemia Father        hairy cell   Hypercholesterolemia Mother    Thyroid disease Mother    Breast cancer Mother 70   Breast cancer Other        maternal great grandmother   Heart disease Maternal Grandfather        MI - age 53   Prostate cancer Paternal Grandfather    Pancreatic cancer Paternal Grandfather    Lung cancer Paternal Grandfather    Bladder  Cancer Neg Hx    Social History   Socioeconomic History   Marital status: Married    Spouse name: Not on file   Number of children: 1   Years of education: Not on file   Highest education level: Not on file  Occupational History   Not on file  Tobacco Use   Smoking status: Former    Current packs/day: 0.00    Types: Cigarettes    Quit date: 01/28/2000    Years since quitting: 22.8   Smokeless tobacco: Never  Substance and Sexual Activity   Alcohol use: No    Alcohol/week: 0.0 standard drinks of alcohol   Drug use: No   Sexual activity: Not on file  Other Topics Concern   Not on file  Social History Narrative   Not on file   Social Determinants of Health   Financial Resource Strain: Not on file  Food Insecurity: Not on file  Transportation Needs: Not on file  Physical Activity: Not on file  Stress: Not on file  Social Connections: Not on file     Review of Systems  Constitutional:  Negative for appetite change  and unexpected weight change.  HENT:  Negative for congestion and sinus pressure.   Respiratory:  Negative for cough, chest tightness and shortness of breath.   Cardiovascular:  Negative for chest pain, palpitations and leg swelling.  Gastrointestinal:  Positive for abdominal pain. Negative for nausea.       No increased diarrhea.  No constipation.   Genitourinary:  Negative for difficulty urinating and dysuria.  Musculoskeletal:  Negative for joint swelling and myalgias.  Skin:  Negative for color change and rash.  Neurological:  Negative for dizziness and headaches.  Psychiatric/Behavioral:  Negative for agitation and dysphoric mood.        Objective:     BP 120/70   Pulse 75   Temp 98 F (36.7 C)   Resp 16   Ht 5\' 2"  (1.575 m)   Wt 124 lb (56.2 kg)   LMP 02/12/2013   SpO2 99%   BMI 22.68 kg/m  Wt Readings from Last 3 Encounters:  11/24/22 124 lb (56.2 kg)  09/24/22 123 lb 3.2 oz (55.9 kg)  08/07/22 124 lb 9.6 oz (56.5 kg)    Physical  Exam   Outpatient Encounter Medications as of 11/24/2022  Medication Sig   acetaminophen (TYLENOL) 500 MG tablet Take 1,000 mg by mouth every 6 (six) hours as needed for moderate pain.   ALPRAZolam (XANAX) 0.25 MG tablet TAKE ONE TABLET DAILY IF NEEDED FOR ANXIETY   cholecalciferol (VITAMIN D3) 25 MCG (1000 UNIT) tablet Take 1,000 Units by mouth daily.   EPINEPHrine 0.3 mg/0.3 mL IJ SOAJ injection    esomeprazole (NEXIUM) 40 MG capsule Take 40 mg by mouth daily at 12 noon.   Estriol 10 % CREA by Does not apply route.   ondansetron (ZOFRAN-ODT) 4 MG disintegrating tablet Take 1 tablet (4 mg total) by mouth every 6 (six) hours as needed for nausea or vomiting.   SUMAtriptan (IMITREX) 100 MG tablet    [DISCONTINUED] predniSONE (DELTASONE) 10 MG tablet Take 4 tablets x 1 day and then decrease by 1/2 tablet per day until down to zero mg. (Patient not taking: Reported on 11/20/2022)   No facility-administered encounter medications on file as of 11/24/2022.     Lab Results  Component Value Date   WBC 10.4 04/23/2022   HGB 13.9 04/23/2022   HCT 41.0 04/23/2022   PLT 209 04/23/2022   GLUCOSE 78 05/23/2022   CHOL 178 05/23/2022   TRIG 116.0 05/23/2022   HDL 66.50 05/23/2022   LDLCALC 88 05/23/2022   ALT 13 05/23/2022   AST 16 05/23/2022   NA 142 05/23/2022   K 3.8 05/23/2022   CL 104 05/23/2022   CREATININE 0.66 05/23/2022   BUN 12 05/23/2022   CO2 28 05/23/2022   TSH 1.45 05/23/2022    MM 3D DIAGNOSTIC MAMMOGRAM UNILATERAL LEFT BREAST  Result Date: 11/05/2022 CLINICAL DATA:  Recall from screening mammogram for possible distortion in the left breast. EXAM: DIGITAL DIAGNOSTIC UNILATERAL LEFT MAMMOGRAM WITH TOMOSYNTHESIS AND CAD; ULTRASOUND LEFT BREAST LIMITED TECHNIQUE: Left digital diagnostic mammography and breast tomosynthesis was performed. The images were evaluated with computer-aided detection. ; Targeted ultrasound examination of the left breast was performed. COMPARISON:   Previous exam(s). ACR Breast Density Category c: The breasts are heterogeneously dense, which may obscure small masses. FINDINGS: The previously described possible distortion does not persist with additional views, consistent with superimposed fibroglandular tissue. No suspicious mass, microcalcification, or other finding is identified. Targeted ultrasound was performed in the area of the previously described  possible distortion from the 1-2 o'clock position. No suspicious solid or cystic mass is identified in this area. IMPRESSION: No evidence of malignancy. RECOMMENDATION: Recommend routine annual screening mammogram in 1 year. I have discussed the findings and recommendations with the patient. If applicable, a reminder letter will be sent to the patient regarding the next appointment. BI-RADS CATEGORY  1: Negative. Electronically Signed   By: Romona Curls M.D.   On: 11/05/2022 11:50   [place] LIMITED ULTRASOUND INCLUDING AXILLA LEFT BREAST   Result Date: 11/05/2022 CLINICAL DATA:  Recall from screening mammogram for possible distortion in the left breast. EXAM: DIGITAL DIAGNOSTIC UNILATERAL LEFT MAMMOGRAM WITH TOMOSYNTHESIS AND CAD; ULTRASOUND LEFT BREAST LIMITED TECHNIQUE: Left digital diagnostic mammography and breast tomosynthesis was performed. The images were evaluated with computer-aided detection. ; Targeted ultrasound examination of the left breast was performed. COMPARISON:  Previous exam(s). ACR Breast Density Category c: The breasts are heterogeneously dense, which may obscure small masses. FINDINGS: The previously described possible distortion does not persist with additional views, consistent with superimposed fibroglandular tissue. No suspicious mass, microcalcification, or other finding is identified. Targeted ultrasound was performed in the area of the previously described possible distortion from the 1-2 o'clock position. No suspicious solid or cystic mass is identified in this area. IMPRESSION:  No evidence of malignancy. RECOMMENDATION: Recommend routine annual screening mammogram in 1 year. I have discussed the findings and recommendations with the patient. If applicable, a reminder letter will be sent to the patient regarding the next appointment. BI-RADS CATEGORY  1: Negative. Electronically Signed   By: Romona Curls M.D.   On: 11/05/2022 11:50       Assessment & Plan:  Chest pain, unspecified type Assessment & Plan: Comes in with chest pain - rates as 5/10.  States has been present since yesterday am.  Nothing she has taken has helped the pain.  EKG her revealed - SR with TWI in I and aVL and v5 and v6.  New changes when compared to previous EKG.  Given persistent chest pain and lateral EKG changes, discussed the need for further w/up and evaluation. Oxygen placed.  She was given 81mg  aspirin (x2).  EMS called.  Pt transferred to ER for further evaluation and treatment.   Orders: -     EKG 12-Lead  Gastroesophageal reflux disease, unspecified whether esophagitis present Assessment & Plan: Continue nexium.  No increased acid reflux.       Dale [place], MD

## 2022-11-24 NOTE — Assessment & Plan Note (Signed)
Home sumatriptan not resumed on admission pending LHC. Sumatriptan is contraindicated in patients with CAD

## 2022-11-24 NOTE — Assessment & Plan Note (Signed)
Continue nexium.  No increased acid reflux.

## 2022-11-24 NOTE — Assessment & Plan Note (Addendum)
Continue heparin per pharmacy As needed nitroglycerin ointment every 6 hours for chest pain, 1 day ordered Heart healthy diet; n.p.o. after midnight Anticipate left heart cath in the a.m. Echo ordered per cardiology team Admit to telemetry cardiac, inpatient

## 2022-11-24 NOTE — Assessment & Plan Note (Signed)
Home alprazolam 0.125 mg daily prn for anxiety resumed

## 2022-11-24 NOTE — Assessment & Plan Note (Signed)
Patient quit 20+ years ago

## 2022-11-24 NOTE — Consult Note (Signed)
Cardiology Consultation   Patient ID: Shannon [name] MRN: [id number]; DOB: 1965/02/24  Admit date: 11/24/2022 Date of Consult: 11/24/2022  PCP:  Dale Big Chimney, MD   [facility] Providers Cardiologist:  None      New consult completed by Dr Kirke Corin  Patient Profile:   Shannon [name] is a 57 y.o. female with a hx of anxiety, GERD, hiatal hernia, hyperthyroidism s/p ablation, anemia, who is being seen 11/24/2022 for the evaluation of NSTEMI at the request of Dr Scotty Court.  History of Present Illness:   Ms. Yarwood with no prior cardiac history but a family history of coronary artery disease with her father starting with multiple heart catheterizations, stent placements, and CABG starting in his mid 33s to early 5s.   She  presented to the [facility] emergency department today via EMS from her PCP office with the complaint of chest pain.  At the office EKG revealed sinus rhythm with T wave inversions in inferior lateral leads compared to prior EKG.  She was having persistent chest pain she described as indigestion.  She was placed on oxygen and given to 81 mg aspirin.  EMS arrived and she was given 2 more additional baby aspirin's 1 spray of nitroglycerin and continued to have pain rated 4 out of 10 on the pain scale.  She states that her discomfort is epigastric and runs up her chest and into her back and shoulders.  She stated that the pain started yesterday morning.  Initially she thought it was her acid reflux but took Nexium without relief.  It has been constant throughout the day.  It is not related with rest or exertional.  She denies any other associated symptoms.  Initial vital signs: Blood pressure 130/85, pulse of 83, respirations of 17, temperature 98.7  Pertinent labs: CO2 20, calcium 8.2, high-sensitivity troponin 1798, 2596, unremarkable CBC  Imaging: Chest x-ray reveals no active cardiopulmonary disease; CT angio of the chest/abdomen/pelvis pending report  Cardiology  was consulted by ER provider Dr. Scotty Court for continued evaluation of NSTEMI.   Past Medical History:  Diagnosis Date   Anemia    Anxiety    Breast pain, right 11/07/2012   Breathing difficulty 06/20/2014   Environmental allergies    GERD (gastroesophageal reflux disease)    Hiatal hernia    small   History of migraine headaches    Hyperthyroidism    s/p ablation   Vaginitis 01/16/2013    Past Surgical History:  Procedure Laterality Date   BREAST BIOPSY Right 12/05/2015   neg   CESAREAN SECTION  11/98   Dr Colletta [place]   CHOLECYSTECTOMY  11/05   Dr Michela Pitcher   JOINT REPLACEMENT Left 2017   hip   NASAL SINUS SURGERY  11/93   SEPTOPLASTY  5/90   Dr Esmeralda Links   TUBAL LIGATION  02/1998   UMBILICAL HERNIA REPAIR  02/1998     Home Medications:  Prior to Admission medications   Medication Sig Start Date End Date Taking? Authorizing Provider  acetaminophen (TYLENOL) 500 MG tablet Take 1,000 mg by mouth every 6 (six) hours as needed for moderate pain (pain score 4-6) or mild pain (pain score 1-3).   Yes [provider]  ALPRAZolam (XANAX) 0.25 MG tablet TAKE ONE TABLET DAILY IF NEEDED FOR ANXIETY Patient taking differently: Take 0.125 mg by mouth daily as needed for anxiety. 04/04/21  Yes Dale Stafford, MD  cholecalciferol (VITAMIN D3) 25 MCG (1000 UNIT) tablet Take 1,000 Units by mouth daily.  Yes [provider]  Cyanocobalamin 1000 MCG/ML KIT Inject 1,000 mcg as directed every 30 (thirty) days.   Yes [provider]  EPINEPHrine 0.3 mg/0.3 mL IJ SOAJ injection Inject 0.3 mg into the muscle as needed for anaphylaxis.   Yes [provider]  esomeprazole (GNP ESOMEPRAZOLE MAGNESIUM) 20 MG capsule Take 40 mg by mouth in the morning.   Yes [provider]  ondansetron (ZOFRAN-ODT) 4 MG disintegrating tablet Take 1 tablet (4 mg total) by mouth every 6 (six) hours as needed for nausea or vomiting. 04/23/22  Yes Ward, Layla Maw, DO  SUMAtriptan  (IMITREX) 100 MG tablet Take 100 mg by mouth daily as needed for migraine.   Yes [provider]    Inpatient Medications: Scheduled Meds:  [START ON 11/25/2022] aspirin EC  81 mg Oral Daily   Continuous Infusions:  PRN Meds: nitroGLYCERIN  Allergies:    Allergies  Allergen Reactions   Codeine Other (See Comments)    Severe Headache   Decongest-Aid [Pseudoephedrine] Other (See Comments)    Increased heart rate   Flagyl [Metronidazole]    Sulfa Antibiotics     Social History:   Social History   Socioeconomic History   Marital status: Married    Spouse name: Not on file   Number of children: 1   Years of education: Not on file   Highest education level: Not on file  Occupational History   Not on file  Tobacco Use   Smoking status: Former    Current packs/day: 0.00    Types: Cigarettes    Quit date: 01/28/2000    Years since quitting: 22.8   Smokeless tobacco: Never  Substance and Sexual Activity   Alcohol use: No    Alcohol/week: 0.0 standard drinks of alcohol   Drug use: No   Sexual activity: Not on file  Other Topics Concern   Not on file  Social History Narrative   Not on file   Social Determinants of Health   Financial Resource Strain: Not on file  Food Insecurity: Not on file  Transportation Needs: Not on file  Physical Activity: Not on file  Stress: Not on file  Social Connections: Not on file  Intimate Partner Violence: Not on file    Family History:    Family History  Problem Relation Age of Onset   Heart disease Father    Hypertension Father    Thyroid disease Father    Hypercholesterolemia Father    Kidney cancer Father    Leukemia Father        hairy cell   Hypercholesterolemia Mother    Thyroid disease Mother    Breast cancer Mother 20   Breast cancer Other        maternal great grandmother   Heart disease Maternal Grandfather        MI - age 3   Prostate cancer Paternal Grandfather    Pancreatic cancer Paternal  Grandfather    Lung cancer Paternal Grandfather    Bladder Cancer Neg Hx      ROS:  Please see the history of present illness.  Review of Systems  Constitutional:  Positive for malaise/fatigue.  Cardiovascular:  Positive for chest pain.  Gastrointestinal:  Positive for heartburn.    All other ROS reviewed and negative.     Physical Exam/Data:   Vitals:   11/24/22 1242 11/24/22 1430 11/24/22 1500  BP: 130/85 (!) 141/77 125/70  Pulse: 83 82 81  Resp: 17 17 16  Temp: 98.7 F (37.1 C)    SpO2: 100% 100% 96%   No intake or output data in the 24 hours ending 11/24/22 1707    11/24/2022   11:09 AM 09/24/2022    2:09 PM 08/07/2022    3:35 PM  Last 3 Weights  Weight (lbs) 124 lb 123 lb 3.2 oz 124 lb 9.6 oz  Weight (kg) 56.246 kg 55.883 kg 56.518 kg     There is no height or weight on file to calculate BMI.  General:  Well nourished, well developed, in no acute distress HEENT: normal Neck: no JVD Vascular: No carotid bruits; Distal pulses 2+ bilaterally Cardiac:  normal S1, S2; RRR; no murmur  Lungs:  clear to auscultation bilaterally, no wheezing, rhonchi or rales  Abd: soft, nontender, no hepatomegaly  Ext: no edema Musculoskeletal:  No deformities, BUE and BLE strength normal and equal Skin: warm and dry  Neuro:  CNs 2-12 intact, no focal abnormalities noted Psych:  Normal affect   EKG:  The EKG was personally reviewed and demonstrates: Sinus rhythm with rate 81 with ST depression T wave inversion in anterior lateral and lateral leads Telemetry:  Telemetry was personally reviewed and demonstrates: Sinus with rates in the 80s  Relevant CV Studies: Echocardiogram ordered and pending  Laboratory Data:  High Sensitivity Troponin:   Recent Labs  Lab 11/24/22 1231 11/24/22 1432  TROPONINIHS 1,798* 2,596*     Chemistry Recent Labs  Lab 11/24/22 1231  NA 136  K 3.8  CL 107  CO2 20*  GLUCOSE 77  BUN 9  CREATININE 0.53  CALCIUM 8.2*  GFRNONAA >60  ANIONGAP 9     No results for input(s): "PROT", "ALBUMIN", "AST", "ALT", "ALKPHOS", "BILITOT" in the last 168 hours. Lipids No results for input(s): "CHOL", "TRIG", "HDL", "LABVLDL", "LDLCALC", "CHOLHDL" in the last 168 hours.  Hematology Recent Labs  Lab 11/24/22 1231  WBC 9.5  RBC 4.68  HGB 14.1  HCT 43.3  MCV 92.5  MCH 30.1  MCHC 32.6  RDW 12.2  PLT 261   Thyroid No results for input(s): "TSH", "FREET4" in the last 168 hours.  BNPNo results for input(s): "BNP", "PROBNP" in the last 168 hours.  DDimer No results for input(s): "DDIMER" in the last 168 hours.   Radiology/Studies:  DG Chest 2 View  Result Date: 11/24/2022 CLINICAL DATA:  Chest pain. EXAM: CHEST - 2 VIEW COMPARISON:  05/02/2014. FINDINGS: Bilateral lung fields are clear. Bilateral costophrenic angles are clear. Normal cardio-mediastinal silhouette. No acute osseous abnormalities. The soft tissues are within normal limits. There are surgical clips in the right upper quadrant, typical of a previous cholecystectomy. IMPRESSION: No active cardiopulmonary disease. Electronically Signed   By: Jules Schick M.D.   On: 11/24/2022 13:57     Assessment and Plan:   NSTEMI -Presented with chest pain and EKG changes from PCP office -High-sensitivity troponins trended 1798 and 2596 -Pending results of CT chest/abdomen/pelvis to start heparin infusion -Continue aspirin 81 mg daily, received 4,  81 mg ASA en route to ED -Continue with telemetry monitoring -EKG as needed for pain or changes -EKG concerning for changes with lateral and anterior lateral T wave inversions new from prior studies -Currently chest pain-free after received nitroglycerin   -Lipid panel pending will start statin -Echocardiogram ordered and pending with further recommendations to follow -N.p.o. after midnight -Tentative left heart catheterization tomorrow with Dr. Okey Dupre with further recommendations to follow  GERD -recommend continuing PPI  therapy  Hyperthyroidism -Status post  ablation -TSH ordered   Risk Assessment/Risk Scores:     TIMI Risk Score for Unstable Angina or Non-ST Elevation MI:   The patient's TIMI risk score is 4, which indicates a 20% risk of all cause mortality, new or recurrent myocardial infarction or need for urgent revascularization in the next 14 days.          For questions or updates, please contact [facility] Please consult [email or web address] for contact info under    Signed, Saachi Zale, NP  11/24/2022 5:07 PM

## 2022-11-24 NOTE — ED Triage Notes (Signed)
Pt comes via EMs from UC with c/o cp. Pt states this started yesterday morning and has been constant since. Pt states epigastric and runs up to shoulders and chest.   4 baby aspirin 1 spray nitroglycerin Pain 4/10 at this time.  22 right hand CBG-99 VSS

## 2022-11-25 ENCOUNTER — Other Ambulatory Visit: Payer: Self-pay

## 2022-11-25 ENCOUNTER — Encounter
Admission: EM | Disposition: A | Discharge status: Home / Self Care | Payer: Self-pay | Source: Home / Self Care | Attending: Internal Medicine

## 2022-11-25 ENCOUNTER — Encounter: Payer: Self-pay | Admitting: Internal Medicine

## 2022-11-25 ENCOUNTER — Inpatient Hospital Stay (HOSPITAL_COMMUNITY)
Admit: 2022-11-25 | Discharge: 2022-11-25 | Disposition: A | Discharge status: Home / Self Care | Payer: BC Managed Care – PPO | Attending: Internal Medicine

## 2022-11-25 DIAGNOSIS — I5181 Takotsubo syndrome: Secondary | ICD-10-CM

## 2022-11-25 DIAGNOSIS — G43809 Other migraine, not intractable, without status migrainosus: Secondary | ICD-10-CM | POA: Diagnosis not present

## 2022-11-25 DIAGNOSIS — F419 Anxiety disorder, unspecified: Secondary | ICD-10-CM

## 2022-11-25 DIAGNOSIS — K219 Gastro-esophageal reflux disease without esophagitis: Secondary | ICD-10-CM | POA: Diagnosis not present

## 2022-11-25 DIAGNOSIS — I2510 Atherosclerotic heart disease of native coronary artery without angina pectoris: Secondary | ICD-10-CM

## 2022-11-25 DIAGNOSIS — R9431 Abnormal electrocardiogram [ECG] [EKG]: Secondary | ICD-10-CM

## 2022-11-25 DIAGNOSIS — I214 Non-ST elevation (NSTEMI) myocardial infarction: Secondary | ICD-10-CM

## 2022-11-25 HISTORY — PX: LEFT HEART CATH AND CORONARY ANGIOGRAPHY: CATH118249

## 2022-11-25 LAB — HEPARIN LEVEL (UNFRACTIONATED)
Heparin Unfractionated: 0.5 [IU]/mL (ref 0.30–0.70)
Heparin Unfractionated: 0.87 [IU]/mL — ABNORMAL HIGH (ref 0.30–0.70)

## 2022-11-25 LAB — BASIC METABOLIC PANEL
Anion gap: 8 (ref 5–15)
BUN: 10 mg/dL (ref 6–20)
CO2: 25 mmol/L (ref 22–32)
Calcium: 9 mg/dL (ref 8.9–10.3)
Chloride: 104 mmol/L (ref 98–111)
Creatinine, Ser: 0.6 mg/dL (ref 0.44–1.00)
GFR, Estimated: >60 mL/min (ref >60)
Glucose, Bld: 94 mg/dL (ref 70–99)
Potassium: 3.7 mmol/L (ref 3.5–5.1)
Sodium: 137 mmol/L (ref 135–145)

## 2022-11-25 LAB — ECHOCARDIOGRAM COMPLETE
AR max vel: 2.45 cm2
AV Area VTI: 2.26 cm2
AV Area mean vel: 2.28 cm2
AV Mean grad: 2 mm[Hg]
AV Peak grad: 4 mm[Hg]
Ao pk vel: 0.99 m/s
Area-P 1/2: 2.32 cm2
Calc EF: 49.4 %
Height: 62 in
MV VTI: 2.01 cm2
S' Lateral: 2.9 cm
Single Plane A2C EF: 47.1 %
Single Plane A4C EF: 47.5 %
Weight: 1869.5 [oz_av]

## 2022-11-25 LAB — CBC
HCT: 44.2 % (ref 36.0–46.0)
Hemoglobin: 15 g/dL (ref 12.0–15.0)
MCH: 30.5 pg (ref 26.0–34.0)
MCHC: 33.9 g/dL (ref 30.0–36.0)
MCV: 90 fL (ref 80.0–100.0)
Platelets: 289 10*3/uL (ref 150–400)
RBC: 4.91 MIL/uL (ref 3.87–5.11)
RDW: 12.3 % (ref 11.5–15.5)
WBC: 7 10*3/uL (ref 4.0–10.5)
nRBC: 0 % (ref 0.0–0.2)

## 2022-11-25 LAB — GLUCOSE, CAPILLARY: Glucose-Capillary: 100 mg/dL — ABNORMAL HIGH (ref 70–99)

## 2022-11-25 LAB — LIPID PANEL
Cholesterol: 201 mg/dL — ABNORMAL HIGH (ref 0–200)
HDL: 73 mg/dL (ref >40)
LDL Cholesterol: 101 mg/dL — ABNORMAL HIGH (ref 0–99)
Total CHOL/HDL Ratio: 2.8 {ratio}
Triglycerides: 133 mg/dL (ref <150)
VLDL: 27 mg/dL (ref 0–40)

## 2022-11-25 SURGERY — LEFT HEART CATH AND CORONARY ANGIOGRAPHY
Anesthesia: Moderate Sedation

## 2022-11-25 MED ORDER — HEPARIN SODIUM (PORCINE) 1000 UNIT/ML IJ SOLN
INTRAMUSCULAR | Status: DC | PRN
  Administered 2022-11-25: 2500 [IU] via INTRAVENOUS

## 2022-11-25 MED ORDER — SODIUM CHLORIDE 0.9 % IV SOLN: INTRAVENOUS | Status: DC | PRN

## 2022-11-25 MED ORDER — VERAPAMIL HCL 2.5 MG/ML IV SOLN
INTRAVENOUS | Status: DC | PRN
  Administered 2022-11-25: 2.5 mg via INTRA_ARTERIAL

## 2022-11-25 MED ORDER — SODIUM CHLORIDE 0.9% FLUSH
3.0000 mL | Freq: Two times a day (BID) | INTRAVENOUS | Status: DC
  Administered 2022-11-25 – 2022-11-26 (×2): 3 mL via INTRAVENOUS

## 2022-11-25 MED ORDER — HEPARIN (PORCINE) IN NACL 2000-0.9 UNIT/L-% IV SOLN
INTRAVENOUS | Status: DC | PRN
  Administered 2022-11-25: 1000 mL

## 2022-11-25 MED ORDER — HEPARIN SODIUM (PORCINE) 1000 UNIT/ML IJ SOLN
INTRAMUSCULAR | Status: AC
  Filled 2022-11-25: qty 10

## 2022-11-25 MED ORDER — VERAPAMIL HCL 2.5 MG/ML IV SOLN
INTRAVENOUS | Status: AC
  Filled 2022-11-25: qty 2

## 2022-11-25 MED ORDER — METOPROLOL SUCCINATE ER 25 MG PO TB24: 12.5000 mg | ORAL_TABLET | Freq: Every day | ORAL | Status: DC

## 2022-11-25 MED ORDER — LABETALOL HCL 5 MG/ML IV SOLN: 10.0000 mg | INTRAVENOUS | Status: AC | PRN

## 2022-11-25 MED ORDER — SODIUM CHLORIDE 0.9 % IV SOLN
INTRAVENOUS | Status: AC
Start: 2022-11-25 — End: 2022-11-25

## 2022-11-25 MED ORDER — FENTANYL CITRATE (PF) 100 MCG/2ML IJ SOLN
INTRAMUSCULAR | Status: DC | PRN
  Administered 2022-11-25: 25 ug via INTRAVENOUS

## 2022-11-25 MED ORDER — MIDAZOLAM HCL 2 MG/2ML IJ SOLN
INTRAMUSCULAR | Status: AC
  Filled 2022-11-25: qty 2

## 2022-11-25 MED ORDER — ASPIRIN 81 MG PO TBEC
81.0000 mg | DELAYED_RELEASE_TABLET | Freq: Every day | ORAL | Status: DC
  Administered 2022-11-26: 81 mg via ORAL
  Filled 2022-11-25: qty 1

## 2022-11-25 MED ORDER — SODIUM CHLORIDE 0.9 % IV SOLN
INTRAVENOUS | Status: DC | PRN
  Administered 2022-11-25: 75 mL/h via INTRAVENOUS

## 2022-11-25 MED ORDER — IOHEXOL 300 MG/ML  SOLN
INTRAMUSCULAR | Status: DC | PRN
  Administered 2022-11-25: 42 mL

## 2022-11-25 MED ORDER — HYDRALAZINE HCL 20 MG/ML IJ SOLN: 10.0000 mg | INTRAMUSCULAR | Status: AC | PRN

## 2022-11-25 MED ORDER — SODIUM CHLORIDE 0.9% FLUSH: 3.0000 mL | INTRAVENOUS | Status: DC | PRN

## 2022-11-25 MED ORDER — ENOXAPARIN SODIUM 40 MG/0.4ML IJ SOSY
40.0000 mg | PREFILLED_SYRINGE | INTRAMUSCULAR | Status: DC
  Administered 2022-11-26: 40 mg via SUBCUTANEOUS
  Filled 2022-11-25 (×3): qty 0.4

## 2022-11-25 MED ORDER — FENTANYL CITRATE (PF) 100 MCG/2ML IJ SOLN
INTRAMUSCULAR | Status: AC
  Filled 2022-11-25: qty 2

## 2022-11-25 MED ORDER — HEPARIN (PORCINE) IN NACL 1000-0.9 UT/500ML-% IV SOLN
INTRAVENOUS | Status: AC
  Filled 2022-11-25: qty 1000

## 2022-11-25 MED ORDER — MIDAZOLAM HCL 2 MG/2ML IJ SOLN
INTRAMUSCULAR | Status: DC | PRN
  Administered 2022-11-25: 1 mg via INTRAVENOUS

## 2022-11-25 MED ORDER — LIDOCAINE HCL 1 % IJ SOLN
INTRAMUSCULAR | Status: AC
  Filled 2022-11-25: qty 20

## 2022-11-25 MED ORDER — SODIUM CHLORIDE 0.9 % IV SOLN: 250.0000 mL | INTRAVENOUS | Status: DC | PRN

## 2022-11-25 MED ORDER — ATORVASTATIN CALCIUM 20 MG PO TABS
40.0000 mg | ORAL_TABLET | Freq: Every day | ORAL | Status: DC
  Filled 2022-11-25: qty 2

## 2022-11-25 SURGICAL SUPPLY — 11 items
CATH 5F 110X4 TIG (CATHETERS) IMPLANT
CATH INFINITI 5FR ANG PIGTAIL (CATHETERS) IMPLANT
DEVICE RAD TR BAND REGULAR (VASCULAR PRODUCTS) IMPLANT
DRAPE BRACHIAL (DRAPES) IMPLANT
GLIDESHEATH SLEND SS 6F .021 (SHEATH) IMPLANT
GUIDEWIRE INQWIRE 1.5J.035X260 (WIRE) IMPLANT
INQWIRE 1.5J .035X260CM (WIRE) ×1
PACK CARDIAC CATH (CUSTOM PROCEDURE TRAY) ×1 IMPLANT
PROTECTION STATION PRESSURIZED (MISCELLANEOUS) ×1
SET ATX-X65L (MISCELLANEOUS) IMPLANT
STATION PROTECTION PRESSURIZED (MISCELLANEOUS) IMPLANT

## 2022-11-25 NOTE — Brief Op Note (Signed)
BRIEF CARDIAC CATHETERIZATION NOTE  DATE: 11/25/2022  TIME: 3:31 PM  PATIENT:  Shannon [name]  57 y.o. female  PRE-OPERATIVE DIAGNOSIS:  Non-ST elevation myocardial infarction  POST-OPERATIVE DIAGNOSIS: Stress-induced cardiomyopathy  PROCEDURE:  Procedure(s): LEFT HEART CATH AND CORONARY ANGIOGRAPHY (N/A)  SURGEON:  Surgeons and Role:    * Nyriah Coote, MD - Primary  FINDINGS: Mild to moderate, nonobstructive coronary artery disease. Mildly reduced left ventricular systolic function with mid left ventricular wall motion abnormality most consistent with a Takotsubo variant. Normal left ventricular filling pressure.  RECOMMENDATIONS: Escalate goal-directed medical therapy for nonischemic cardiomyopathy, likely Takotsubo variant. Continue medical therapy and risk factor modification to prevent progression of mild-moderate CAD.  Yvonne Kendall, MD [facility]

## 2022-11-25 NOTE — Plan of Care (Signed)

## 2022-11-25 NOTE — Progress Notes (Signed)
Progress Note   Patient: Shannon [name]:[id number] DOB: 07-06-1965 DOA: 11/24/2022     1 DOS: the patient was seen and examined on 11/25/2022   Brief hospital course: [name] is a 57 year old female with history of anxiety, GERD, who presents emergency department for chief concerns of chest pain.  Vitals in the ED showed temperature of 98.7, respiration rate of 16, heart rate of 75, blood pressure 120/70, SpO2 99% room air.  Serum sodium is 136, potassium 3.8, chloride 107, bicarb 20, BUN of 9, serum creatinine of 0.53, EGFR greater than 60, nonfasting blood glucose 77, WBC 9.5, hemoglobin 14.1, platelets of 261.  High sensitive troponin was initially 1798 and on repeat was 2596.  CTA CAP for dissection: Read as no evidence of aortic aneurysm or dissection.  No acute localizing process in the chest, abdomen, pelvis.  ED treatment: Heparin GTT per pharmacy.  Cardiology was consulted, tentative plan for left heart cath in the a.m. for concern of NSTEMI  10/29: Vital stable, lipid panel with mildly elevated total cholesterol at 201 and LDL of 101. Echocardiogram with EF of 45 to 50%, regional wall motion abnormalities and mild LVH.  Grade 1 diastolic dysfunction and severe hypokinesis of left ventricular, mid apical inferolateral wall. Going for cardiac catheterization later today.  Assessment and Plan: * NSTEMI (non-ST elevated myocardial infarction) (HCC) Continue heparin per pharmacy As needed nitroglycerin ointment every 6 hours for chest pain, 1 day ordered Echocardiogram with EF of 45 to 50% with regional wall motion abnormalities and severe hypokinesis of mid apical, inferolateral wall. Going for cardiac catheterization today  Anxiety Home alprazolam 0.125 mg daily prn for anxiety resumed  Migraine Home sumatriptan not resumed on admission pending LHC. Sumatriptan is contraindicated in patients with CAD  Tobacco abuse Patient quit 20+ years  ago  Gastroesophageal reflux disease without esophagitis Home PPI equivalent resumed   Subjective: Patient was seen and examined today.  No chest pain at that time.  Awaiting cardiac cath.  Multiple family members at bedtime.  Overall healthy lady with a good lifestyle, history of cardiac disease in father at the age of 14s  Physical Exam: Vitals:   11/25/22 0320 11/25/22 0821 11/25/22 1311 11/25/22 1400  BP: 90/62 101/67 99/60 110/72  Pulse: 72 75 80 70  Resp: 20 18  17   Temp: 98.2 F (36.8 C) 98.1 F (36.7 C) 98 F (36.7 C) 98.1 F (36.7 C)  TempSrc: Oral Oral Oral Oral  SpO2: 95%  99% 98%  Weight:    53 kg  Height:    5\' 2"  (1.575 m)   General.  Thin built lady, in no acute distress. Pulmonary.  Lungs clear bilaterally, normal respiratory effort. CV.  Regular rate and rhythm, no JVD, rub or murmur. Abdomen.  Soft, nontender, nondistended, BS positive. CNS.  Alert and oriented .  No focal neurologic deficit. Extremities.  No edema, no cyanosis, pulses intact and symmetrical. Psychiatry.  Judgment and insight appears normal.   Data Reviewed: Prior data reviewed  Family Communication: Discussed with husband, daughter and cousin at bedside  Disposition: Status is: Inpatient Remains inpatient appropriate because: Severity of illness  Planned Discharge Destination: Home  Time spent: 50 minutes  This record has been created using Conservation officer, historic buildings. Errors have been sought and corrected,but may not always be located. Such creation errors do not reflect on the standard of care.   Author: Arnetha Courser, MD 11/25/2022 2:55 PM  For on call review [email or web address].

## 2022-11-25 NOTE — Progress Notes (Signed)
Transition of Care [facility]) - Inpatient Brief Assessment   Patient Details  Name: Shannon [name] MRN: [id number] Date of Birth: 1965/08/11  Transition of Care [facility]) CM/SW Contact:    Darolyn Rua, LCSW Phone Number: 11/25/2022, 9:16 AM   Clinical Narrative:  Patient from home presents to ED with chest pain, cards following, plan for left heart cath today.  PCP Dr Lorin Picket Insurance:[employer] PPO  No TOC needs at this time, please consult TOC should discharge planning needs arise.   Transition of Care Asessment: Insurance and Status: Insurance coverage has been reviewed Patient has primary care physician: Yes Home environment has been reviewed: from home Prior level of function:: independent Prior/Current Home Services: Current home services Social Determinants of Health Reivew: SDOH reviewed no interventions necessary Readmission risk has been reviewed: Yes Transition of care needs: no transition of care needs at this time

## 2022-11-25 NOTE — Progress Notes (Signed)
Rounding Note    Patient Name: Shannon [name] Date of Encounter: 11/25/2022  [facility] Cardiologist: Cathlyn Parsons  Subjective   No further chest pain overnight.  No shortness of breath.  Inpatient Medications    Scheduled Meds:  [START ON 11/26/2022] aspirin EC  81 mg Oral Daily   atorvastatin  40 mg Oral Daily   [START ON 11/26/2022] enoxaparin (LOVENOX) injection  40 mg Subcutaneous Q24H   pantoprazole  80 mg Oral QAC breakfast   sodium chloride flush  3 mL Intravenous Q12H   Continuous Infusions:  sodium chloride Stopped (11/25/22 1554)   sodium chloride Stopped (11/25/22 1839)   sodium chloride     PRN Meds: sodium chloride, sodium chloride, acetaminophen **OR** acetaminophen, ALPRAZolam, hydrALAZINE, labetalol, melatonin, senna-docusate, sodium chloride flush   Vital Signs    Vitals:   11/25/22 1645 11/25/22 1704 11/25/22 1715 11/25/22 1738  BP: 113/69 102/73 107/66 100/68  Pulse: 83 84 88 81  Resp: 15 18 17 18   Temp:    97.7 F (36.5 C)  TempSrc:      SpO2: 99% 99% 98% 96%  Weight:      Height:        Intake/Output Summary (Last 24 hours) at 11/25/2022 1851 Last data filed at 11/25/2022 1737 Gross per 24 hour  Intake 746.5 ml  Output --  Net 746.5 ml      11/25/2022    2:00 PM 11/24/2022    9:36 PM 11/24/2022   11:09 AM  Last 3 Weights  Weight (lbs) 116 lb 13.5 oz 116 lb 13.5 oz 124 lb  Weight (kg) 53 kg 53 kg 56.246 kg      Telemetry    Sinus rhythm while in specials/recovery (unable to review telemetry from floor)- Personally Reviewed  ECG    Normal sinus rhythm with anterolateral T wave inversions and QT prolongation (QTc 538 ms). - Personally Reviewed  Physical Exam   GEN: No acute distress.   Neck: No JVD Cardiac: RRR, no murmurs, rubs, or gallops.  Respiratory: Clear to auscultation bilaterally. GI: Soft, nontender, non-distended  MS: No edema; No deformity. Neuro:  Nonfocal  Psych: Normal affect   Labs     High Sensitivity Troponin:   Recent Labs  Lab 11/24/22 1231 11/24/22 1432  TROPONINIHS 1,798* 2,596*     Chemistry Recent Labs  Lab 11/24/22 1231 11/25/22 0753  NA 136 137  K 3.8 3.7  CL 107 104  CO2 20* 25  GLUCOSE 77 94  BUN 9 10  CREATININE 0.53 0.60  CALCIUM 8.2* 9.0  GFRNONAA >60 >60  ANIONGAP 9 8    Lipids  Recent Labs  Lab 11/25/22 0753  CHOL 201*  TRIG 133  HDL 73  LDLCALC 101*  CHOLHDL 2.8    Hematology Recent Labs  Lab 11/24/22 1231 11/25/22 0753  WBC 9.5 7.0  RBC 4.68 4.91  HGB 14.1 15.0  HCT 43.3 44.2  MCV 92.5 90.0  MCH 30.1 30.5  MCHC 32.6 33.9  RDW 12.2 12.3  PLT 261 289   Thyroid  Recent Labs  Lab 11/24/22 1432  TSH 1.031    BNPNo results for input(s): "BNP", "PROBNP" in the last 168 hours.  DDimer No results for input(s): "DDIMER" in the last 168 hours.   Radiology    CARDIAC CATHETERIZATION  Result Date: 11/25/2022 Conclusions: Mild to moderate, nonobstructive coronary artery disease with 30-40% mid LAD stenosis.  No significant disease noted in the LMCA, LCx, or  RCA. Mildly reduced left ventricular systolic function with mid left ventricular wall motion abnormality most consistent with a Takotsubo variant. Normal left ventricular filling pressure.  Recommendations: Escalate goal-directed medical therapy for nonischemic cardiomyopathy, likely Takotsubo variant. Continue medical therapy and risk factor modification to prevent progression of mild-moderate CAD. Yvonne Kendall, MD [facility]  ECHOCARDIOGRAM COMPLETE  Result Date: 11/25/2022    ECHOCARDIOGRAM REPORT   Patient Name:   Shannon [name] Date of Exam: 11/25/2022 Medical Rec #:  [id number]      Height:       62.0 in Accession #:    [id number]     Weight:       116.8 lb Date of Birth:  October 10, 1965      BSA:          1.521 m Patient Age:    57 years       BP:           101/67 mmHg Patient Gender: F              HR:           73 bpm. Exam Location:  [facility] Procedure: 2D Echo,  3D Echo, Cardiac Doppler, Color Doppler and Strain Analysis Indications:     NSTEMI  History:         Patient has no prior history of Echocardiogram examinations.                  Acute MI; Signs/Symptoms:Chest Pain.  Sonographer:     Mikki Harbor Referring Phys:  [id number] AMY N COX Diagnosing Phys: Yvonne Kendall MD  Sonographer Comments: Global longitudinal strain was attempted. IMPRESSIONS  1. Left ventricular ejection fraction, by estimation, is 45 to 50%. The left ventricle has mildly decreased function. The left ventricle demonstrates regional wall motion abnormalities (see scoring diagram/findings for description). There is mild left ventricular hypertrophy. Left ventricular diastolic parameters are consistent with Grade I diastolic dysfunction (impaired relaxation). There is severe hypokinesis of the left ventricular, mid-apical inferolateral wall. The average left ventricular global longitudinal strain is -12.5 %. The global longitudinal strain is abnormal.  2. Right ventricular systolic function is normal. The right ventricular size is normal.  3. The mitral valve is normal in structure. No evidence of mitral valve regurgitation. No evidence of mitral stenosis.  4. The aortic valve is tricuspid. Aortic valve regurgitation is not visualized. No aortic stenosis is present.  5. The inferior vena cava is normal in size with greater than 50% respiratory variability, suggesting right atrial pressure of 3 mmHg. FINDINGS  Left Ventricle: Left ventricular ejection fraction, by estimation, is 45 to 50%. The left ventricle has mildly decreased function. The left ventricle demonstrates regional wall motion abnormalities. Severe hypokinesis of the left ventricular, mid-apical  inferolateral wall. The average left ventricular global longitudinal strain is -12.5 %. The global longitudinal strain is abnormal. The left ventricular internal cavity size was normal in size. There is mild left ventricular hypertrophy. Left  ventricular diastolic parameters are consistent with Grade I diastolic dysfunction (impaired relaxation). Right Ventricle: The right ventricular size is normal. No increase in right ventricular wall thickness. Right ventricular systolic function is normal. Left Atrium: Left atrial size was normal in size. Right Atrium: Right atrial size was normal in size. Pericardium: There is no evidence of pericardial effusion. Mitral Valve: The mitral valve is normal in structure. No evidence of mitral valve regurgitation. No evidence of mitral valve stenosis. MV peak gradient, 3.5 mmHg. The mean mitral  valve gradient is 1.0 mmHg. Tricuspid Valve: The tricuspid valve is normal in structure. Tricuspid valve regurgitation is not demonstrated. Aortic Valve: The aortic valve is tricuspid. Aortic valve regurgitation is not visualized. No aortic stenosis is present. Aortic valve mean gradient measures 2.0 mmHg. Aortic valve peak gradient measures 4.0 mmHg. Aortic valve area, by VTI measures 2.26 cm. Pulmonic Valve: The pulmonic valve was normal in structure. Pulmonic valve regurgitation is not visualized. No evidence of pulmonic stenosis. Aorta: The aortic root is normal in size and structure. Pulmonary Artery: The pulmonary artery is of normal size. Venous: The inferior vena cava is normal in size with greater than 50% respiratory variability, suggesting right atrial pressure of 3 mmHg. IAS/Shunts: No atrial level shunt detected by color flow Doppler.  LEFT VENTRICLE PLAX 2D LVIDd:         3.70 cm     Diastology LVIDs:         2.90 cm     LV e' medial:    5.33 cm/s LV PW:         1.10 cm     LV E/e' medial:  8.2 LV IVS:        1.10 cm     LV e' lateral:   5.98 cm/s LVOT diam:     2.00 cm     LV E/e' lateral: 7.3 LV SV:         44 LV SV Index:   29          2D Longitudinal Strain LVOT Area:     3.14 cm    2D Strain GLS Avg:     -12.5 %  LV Volumes (MOD) LV vol d, MOD A2C: 39.5 ml LV vol d, MOD A4C: 54.9 ml LV vol s, MOD A2C: 20.9 ml  LV vol s, MOD A4C: 28.8 ml LV SV MOD A2C:     18.6 ml LV SV MOD A4C:     54.9 ml LV SV MOD BP:      24.3 ml RIGHT VENTRICLE RV Basal diam:  2.45 cm RV Mid diam:    2.10 cm RV S prime:     11.20 cm/s LEFT ATRIUM           Index        RIGHT ATRIUM          Index LA diam:      2.80 cm 1.84 cm/m   RA Area:     8.53 cm LA Vol (A2C): 24.4 ml 16.04 ml/m  RA Volume:   16.30 ml 10.71 ml/m LA Vol (A4C): 19.3 ml 12.69 ml/m  AORTIC VALVE                    PULMONIC VALVE AV Area (Vmax):    2.45 cm     PV Vmax:       0.83 m/s AV Area (Vmean):   2.28 cm     PV Peak grad:  2.8 mmHg AV Area (VTI):     2.26 cm AV Vmax:           99.40 cm/s AV Vmean:          65.800 cm/s AV VTI:            0.195 m AV Peak Grad:      4.0 mmHg AV Mean Grad:      2.0 mmHg LVOT Vmax:         77.40 cm/s LVOT Vmean:  47.700 cm/s LVOT VTI:          0.140 m LVOT/AV VTI ratio: 0.72  AORTA Ao Root diam: 3.20 cm MITRAL VALVE MV Area (PHT): 2.32 cm    SHUNTS MV Area VTI:   2.01 cm    Systemic VTI:  0.14 m MV Peak grad:  3.5 mmHg    Systemic Diam: 2.00 cm MV Mean grad:  1.0 mmHg MV Vmax:       0.94 m/s MV Vmean:      42.1 cm/s MV Decel Time: 327 msec MV E velocity: 43.50 cm/s MV A velocity: 87.50 cm/s MV E/A ratio:  0.50 Cristal Deer Zephyra Bernardi MD Electronically signed by Yvonne Kendall MD Signature Date/Time: 11/25/2022/2:35:02 PM    Final    CT Angio Chest/Abd/Pel for Dissection W and/or Wo Contrast  Result Date: 11/24/2022 CLINICAL DATA:  Acute aortic syndrome suspected EXAM: CT ANGIOGRAPHY CHEST, ABDOMEN AND PELVIS TECHNIQUE: Non-contrast CT of the chest was initially obtained. Multidetector CT imaging through the chest, abdomen and pelvis was performed using the standard protocol during bolus administration of intravenous contrast. Multiplanar reconstructed images and MIPs were obtained and reviewed to evaluate the vascular anatomy. RADIATION DOSE REDUCTION: This exam was performed according to the departmental dose-optimization program which  includes automated exposure control, adjustment of the mA and/or kV according to patient size and/or use of iterative reconstruction technique. CONTRAST:  OMNIPAQUE IOHEXOL 350 MG/ML SOLN COMPARISON:  CT abdomen and pelvis 04/23/2022 FINDINGS: CTA CHEST FINDINGS Cardiovascular: Preferential opacification of the thoracic aorta. No evidence of thoracic aortic aneurysm or dissection. Normal heart size. No pericardial effusion. There is mild calcified atherosclerotic disease throughout the aorta. Mediastinum/Nodes: The thyroid gland is diffusely heterogeneous. The right thyroid is enlarged there is a posterior thyroid nodule measuring 2.2 x 1.6 cm on the right. There are no other enlarged mediastinal or hilar lymph nodes. The esophagus is within normal limits. Lungs/Pleura: Lungs are clear. No pleural effusion or pneumothorax. Musculoskeletal: No chest wall abnormality. No acute or significant osseous findings. Review of the MIP images confirms the above findings. CTA ABDOMEN AND PELVIS FINDINGS VASCULAR Aorta: Normal caliber aorta without aneurysm, dissection, vasculitis or significant stenosis. There is mild calcified atherosclerotic disease throughout the aorta. Celiac: Patent without evidence of aneurysm, dissection, vasculitis or significant stenosis. SMA: Patent without evidence of aneurysm, dissection, vasculitis or significant stenosis. Renals: Both renal arteries are patent without evidence of aneurysm, dissection, vasculitis, fibromuscular dysplasia or significant stenosis. IMA: Patent without evidence of aneurysm, dissection, vasculitis or significant stenosis. Inflow: Patent without evidence of aneurysm, dissection, vasculitis or significant stenosis. Veins: No obvious venous abnormality within the limitations of this arterial phase study. Review of the MIP images confirms the above findings. NON-VASCULAR Hepatobiliary: No focal liver abnormality is seen. Status post cholecystectomy. No biliary  dilatation. Pancreas: Unremarkable. No pancreatic ductal dilatation or surrounding inflammatory changes. Spleen: Normal in size without focal abnormality. Adrenals/Urinary Tract: Adrenal glands are unremarkable. Kidneys are normal, without renal calculi, focal lesion, or hydronephrosis. Bladder is unremarkable. Stomach/Bowel: Stomach is within normal limits. Appendix appears normal. No evidence of bowel wall thickening, distention, or inflammatory changes. There are scattered colonic diverticula. Lymphatic: No enlarged lymph nodes are seen. Reproductive: There is a posterior calcified uterine fibroid measuring 3.2 x 1.8 cm, unchanged. Adnexa are within normal limits. Other: No abdominal wall hernia or abnormality. No abdominopelvic ascites. Musculoskeletal: Left hip arthroplasty is present. Review of the MIP images confirms the above findings. IMPRESSION: 1. No evidence for aortic aneurysm or dissection.  2. No acute localizing process in the chest, abdomen or pelvis. 3. Colonic diverticulosis without evidence for diverticulitis. 4. Uterine fibroid. 5. Incidental right thyroid nodule with heterogeneous and enlarged thyroid measuring 2.2 cm. Recommend non-emergent thyroid ultrasound. Reference: J Am Coll Radiol. 2015 Feb;12(2): 143-50 Aortic Atherosclerosis (ICD10-I70.0). Electronically Signed   By: Darliss Cheney M.D.   On: 11/24/2022 17:26   DG Chest 2 View  Result Date: 11/24/2022 CLINICAL DATA:  Chest pain. EXAM: CHEST - 2 VIEW COMPARISON:  05/02/2014. FINDINGS: Bilateral lung fields are clear. Bilateral costophrenic angles are clear. Normal cardio-mediastinal silhouette. No acute osseous abnormalities. The soft tissues are within normal limits. There are surgical clips in the right upper quadrant, typical of a previous cholecystectomy. IMPRESSION: No active cardiopulmonary disease. Electronically Signed   By: Jules Schick M.D.   On: 11/24/2022 13:57    Cardiac Studies   See catheterization and echo  above.  Patient Profile     57 y.o. female with history of anxiety, GERD, hiatal hernia, hypothyroidism, and anemia, admitted with chest pain and elevated troponin concerning for NSTEMI but found to have nonobstructive coronary artery disease and wall motion abnormalities most consistent with Takotsubo variant.  Assessment & Plan    Stress-induced cardiomyopathy: Patient presentation is likely due to stress-induced cardiomyopathy with catheterization showing nonobstructive CAD and left ventricular wall motion abnormalities consistent with Takotsubo variant.  No further chest pain reported. -Discontinue heparin. -Continue aspirin 81 mg daily.  No role for dual antiplatelet therapy given that elevated troponin is consistent with stress-induced cardiomyopathy and not ACS. -Initiate metoprolol succinate 12.5 mg daily. -If blood pressure allows, consider addition of ARB prior to discharge. -Outpatient cardiac rehab referral placed. -Gentle postcatheterization hydration with mildly reduced LVEF and normal LVEDP.  QT prolongation: Repeat EKG this afternoon shows moderately prolonged QT, most likely related to stress-induced cardiomyopathy. -Check magnesium level with morning labs. -Avoid QT prolonging medications.  Disposition: Anticipate discharge home tomorrow.  For questions or updates, please contact [facility] Please consult [email or web address] for contact info under [facility].     Signed, Yvonne Kendall, MD  11/25/2022, 6:51 PM

## 2022-11-25 NOTE — Progress Notes (Signed)
*  PRELIMINARY RESULTS* Echocardiogram 2D Echocardiogram has been performed.  Shannon [name] 11/25/2022, 9:43 AM

## 2022-11-25 NOTE — Plan of Care (Signed)

## 2022-11-25 NOTE — Interval H&P Note (Signed)
History and Physical Interval Note:  11/25/2022 2:29 PM  Shannon [name]  has presented today for surgery, with the diagnosis of Non-ST elevation myocardial infarction.  The various methods of treatment have been discussed with the patient and family. After consideration of risks, benefits and other options for treatment, the patient has consented to  Procedure(s): LEFT HEART CATH AND CORONARY ANGIOGRAPHY (N/A) as a surgical intervention.  The patient's history has been reviewed, patient examined, no change in status, stable for surgery.  I have reviewed the patient's chart and labs.  Questions were answered to the patient's satisfaction.    Cath Lab Visit (complete for each Cath Lab visit)  Clinical Evaluation Leading to the Procedure:   ACS: Yes.    Non-ACS:  N/A  Brogen Duell

## 2022-11-25 NOTE — Consult Note (Signed)
PHARMACY - ANTICOAGULATION CONSULT NOTE  Pharmacy Consult for heparin infusion Indication: ACS/NSTEMI  Allergies  Allergen Reactions   Codeine Other (See Comments)    Severe Headache   Decongest-Aid [Pseudoephedrine] Other (See Comments)    Increased heart rate   Flagyl [Metronidazole]    Sulfa Antibiotics     Patient Measurements: Height: 5\' 2"  (157.5 cm) Weight: 53 kg (116 lb 13.5 oz) IBW/kg (Calculated) : 50.1 Heparin Dosing Weight: 56.2 kg  Vital Signs: Temp: 98.1 F (36.7 C) (10/29 0821) Temp Source: Oral (10/29 0821) BP: 101/67 (10/29 0821) Pulse Rate: 75 (10/29 0821)  Labs: Recent Labs    11/24/22 1231 11/24/22 1432 11/24/22 1717 11/24/22 2348 11/25/22 0753  HGB 14.1  --   --   --  15.0  HCT 43.3  --   --   --  44.2  PLT 261  --   --   --  289  APTT  --   --  30  --   --   LABPROT  --   --  13.5  --   --   INR  --   --  1.0  --   --   HEPARINUNFRC  --   --   --  0.87* 0.50  CREATININE 0.53  --   --   --  0.60  TROPONINIHS 1,798* 2,596*  --   --   --     Estimated Creatinine Clearance: 59.9 mL/min (by C-G formula based on SCr of 0.6 mg/dL).   Medical History: Past Medical History:  Diagnosis Date   Anemia    Anxiety    Breast pain, right 11/07/2012   Breathing difficulty 06/20/2014   Environmental allergies    GERD (gastroesophageal reflux disease)    Hiatal hernia    small   History of migraine headaches    Hyperthyroidism    s/p ablation   Vaginitis 01/16/2013    Medications:  No home anticoagulants per pharmacist review  Assessment: 57 yo female presented to the ED with chief complaint of chest pain. In ED found to have elevated troponin which peaked at 2596.  Pharmacy consulted to start heparin infusion for possible ACS. Hgb and platelets WNL no sign of an active bleed. Patient is going for cath today.   Goal of Therapy:  Heparin level 0.3-0.7 units/ml Monitor platelets by anticoagulation protocol: Yes  10/29 2348 HL 0.87,  supratherapeutic 10/29 0753 HL 0.50, therapeutic x 1   Plan: Continue heparin infusion at 600 units/hr Recheck HL in 6 hours CBC daily while on heparin   Francetta Found, PharmD Candidate Class of 2025  11/25/2022 9:27 AM

## 2022-11-25 NOTE — Consult Note (Signed)
PHARMACY - ANTICOAGULATION CONSULT NOTE  Pharmacy Consult for heparin infusion Indication: ACS/NSTEMI  Allergies  Allergen Reactions   Codeine Other (See Comments)    Severe Headache   Decongest-Aid [Pseudoephedrine] Other (See Comments)    Increased heart rate   Flagyl [Metronidazole]    Sulfa Antibiotics     Patient Measurements: Height: 5\' 2"  (157.5 cm) Weight: 53 kg (116 lb 13.5 oz) IBW/kg (Calculated) : 50.1 Heparin Dosing Weight: 56.2 kg  Vital Signs: Temp: 98.5 F (36.9 C) (10/28 2302) Temp Source: Oral (10/28 2302) BP: 100/69 (10/28 2302) Pulse Rate: 73 (10/28 2302)  Labs: Recent Labs    11/24/22 1231 11/24/22 1432 11/24/22 1717 11/24/22 2348  HGB 14.1  --   --   --   HCT 43.3  --   --   --   PLT 261  --   --   --   APTT  --   --  30  --   LABPROT  --   --  13.5  --   INR  --   --  1.0  --   HEPARINUNFRC  --   --   --  0.87*  CREATININE 0.53  --   --   --   TROPONINIHS 1,798* 2,596*  --   --     Estimated Creatinine Clearance: 59.9 mL/min (by C-G formula based on SCr of 0.53 mg/dL).   Medical History: Past Medical History:  Diagnosis Date   Anemia    Anxiety    Breast pain, right 11/07/2012   Breathing difficulty 06/20/2014   Environmental allergies    GERD (gastroesophageal reflux disease)    Hiatal hernia    small   History of migraine headaches    Hyperthyroidism    s/p ablation   Vaginitis 01/16/2013    Medications:  No home anticoagulants per pharmacist review  Assessment: 57 yo female presented to the ED with chief complaint of chest pain.  In ED found to have elevated troponin.  Pharmacy consulted to start heparin infusion.  Goal of Therapy:  Heparin level 0.3-0.7 units/ml Monitor platelets by anticoagulation protocol: Yes  10/29 2348 HL 0.87, supratherapeutic   Plan: Decrease heparin infusion to 600 units/hr Recheck HL at 0800 after rate change CBC daily while on heparin   Otelia Sergeant, PharmD, [facility] 11/25/2022 1:01  AM

## 2022-11-26 ENCOUNTER — Encounter: Payer: Self-pay | Admitting: Internal Medicine

## 2022-11-26 ENCOUNTER — Other Ambulatory Visit (HOSPITAL_COMMUNITY): Payer: Self-pay

## 2022-11-26 ENCOUNTER — Other Ambulatory Visit: Payer: Self-pay | Admitting: Cardiovascular Disease

## 2022-11-26 DIAGNOSIS — I25118 Atherosclerotic heart disease of native coronary artery with other forms of angina pectoris: Secondary | ICD-10-CM | POA: Diagnosis not present

## 2022-11-26 DIAGNOSIS — M8580 Other specified disorders of bone density and structure, unspecified site: Secondary | ICD-10-CM

## 2022-11-26 DIAGNOSIS — I5181 Takotsubo syndrome: Secondary | ICD-10-CM

## 2022-11-26 DIAGNOSIS — R9431 Abnormal electrocardiogram [ECG] [EKG]: Secondary | ICD-10-CM

## 2022-11-26 DIAGNOSIS — J301 Allergic rhinitis due to pollen: Secondary | ICD-10-CM | POA: Diagnosis not present

## 2022-11-26 DIAGNOSIS — F419 Anxiety disorder, unspecified: Secondary | ICD-10-CM | POA: Diagnosis not present

## 2022-11-26 DIAGNOSIS — J3089 Other allergic rhinitis: Secondary | ICD-10-CM | POA: Diagnosis not present

## 2022-11-26 DIAGNOSIS — E782 Mixed hyperlipidemia: Secondary | ICD-10-CM | POA: Diagnosis not present

## 2022-11-26 DIAGNOSIS — I2510 Atherosclerotic heart disease of native coronary artery without angina pectoris: Secondary | ICD-10-CM | POA: Insufficient documentation

## 2022-11-26 DIAGNOSIS — G43809 Other migraine, not intractable, without status migrainosus: Secondary | ICD-10-CM | POA: Diagnosis not present

## 2022-11-26 DIAGNOSIS — I214 Non-ST elevation (NSTEMI) myocardial infarction: Secondary | ICD-10-CM | POA: Diagnosis not present

## 2022-11-26 LAB — CBC
HCT: 39.2 % (ref 36.0–46.0)
Hemoglobin: 13.1 g/dL (ref 12.0–15.0)
MCH: 30.3 pg (ref 26.0–34.0)
MCHC: 33.4 g/dL (ref 30.0–36.0)
MCV: 90.7 fL (ref 80.0–100.0)
Platelets: 253 10*3/uL (ref 150–400)
RBC: 4.32 MIL/uL (ref 3.87–5.11)
RDW: 12.1 % (ref 11.5–15.5)
WBC: 6.6 10*3/uL (ref 4.0–10.5)
nRBC: 0 % (ref 0.0–0.2)

## 2022-11-26 LAB — BASIC METABOLIC PANEL
Anion gap: 8 (ref 5–15)
BUN: 14 mg/dL (ref 6–20)
CO2: 24 mmol/L (ref 22–32)
Calcium: 8.8 mg/dL — ABNORMAL LOW (ref 8.9–10.3)
Chloride: 104 mmol/L (ref 98–111)
Creatinine, Ser: 0.62 mg/dL (ref 0.44–1.00)
GFR, Estimated: >60 mL/min (ref >60)
Glucose, Bld: 87 mg/dL (ref 70–99)
Potassium: 3.9 mmol/L (ref 3.5–5.1)
Sodium: 136 mmol/L (ref 135–145)

## 2022-11-26 LAB — MAGNESIUM: Magnesium: 2.1 mg/dL (ref 1.7–2.4)

## 2022-11-26 LAB — GLUCOSE, CAPILLARY: Glucose-Capillary: 111 mg/dL — ABNORMAL HIGH (ref 70–99)

## 2022-11-26 MED ORDER — EMPAGLIFLOZIN 10 MG PO TABS
10.0000 mg | ORAL_TABLET | Freq: Every day | ORAL | Status: DC
  Filled 2022-11-26: qty 1

## 2022-11-26 MED ORDER — NITROGLYCERIN 0.4 MG SL SUBL: 0.4000 mg | SUBLINGUAL_TABLET | SUBLINGUAL | 3 refills | Status: AC | PRN

## 2022-11-26 MED ORDER — EMPAGLIFLOZIN 10 MG PO TABS: 10.0000 mg | ORAL_TABLET | Freq: Every day | ORAL | 1 refills | Status: DC

## 2022-11-26 MED ORDER — ATORVASTATIN CALCIUM 20 MG PO TABS: 20.0000 mg | ORAL_TABLET | Freq: Every day | ORAL | Status: DC

## 2022-11-26 MED ORDER — ASPIRIN 81 MG PO TBEC: 81.0000 mg | DELAYED_RELEASE_TABLET | Freq: Every day | ORAL | 12 refills | Status: AC

## 2022-11-26 MED ORDER — ATORVASTATIN CALCIUM 20 MG PO TABS: 20.0000 mg | ORAL_TABLET | Freq: Every day | ORAL | 2 refills | Status: DC

## 2022-11-26 NOTE — Progress Notes (Signed)
Rounding Note    Patient Name: Shannon [name] Date of Encounter: 11/26/2022  [facility] Cardiologist: Dr. Okey Dupre  Subjective   Recent events discussed, seen by Dr. Lorin Picket October 28 for chest pain, abnormal EKG and was referred to the ER via EMS Troponin 1800, 2600 Echocardiogram with ejection fraction 45 to 50%, severe hypokinesis mid to apical inferolateral wall Taken to cardiac catheterization lab yesterday, nonobstructive disease noted, 30% mid LAD otherwise no culprit vessel noted  Denies significant chest pain concerning for angina, resting supine comfortable, family at the bedside  Inpatient Medications    Scheduled Meds:  acetaminophen 500 MG tablet Commonly known as: TYLENOL Take 1,000 mg by mouth every 6 (six) hours as needed for moderate pain (pain score 4-6) or mild pain (pain score 1-3).    ALPRAZolam 0.25 MG tablet Commonly known as: XANAX TAKE ONE TABLET DAILY IF NEEDED FOR ANXIETY What changed:  how much to take how to take this when to take this reasons to take this additional instructions    aspirin EC 81 MG tablet Take 1 tablet (81 mg total) by mouth daily. Swallow whole. Start taking on: November 27, 2022    atorvastatin 20 MG tablet Commonly known as: LIPITOR Take 1 tablet (20 mg total) by mouth daily. Start taking on: November 27, 2022    cholecalciferol 25 MCG (1000 UNIT) tablet Commonly known as: VITAMIN D3 Take 1,000 Units by mouth daily.    Cyanocobalamin 1000 MCG/ML Kit Inject 1,000 mcg as directed every 30 (thirty) days.    empagliflozin 10 MG Tabs tablet Commonly known as: JARDIANCE Take 1 tablet (10 mg total) by mouth daily.    EPINEPHrine 0.3 mg/0.3 mL Soaj injection Commonly known as: EPI-PEN Inject 0.3 mg into the muscle as needed for anaphylaxis.    GNP Esomeprazole Magnesium 20 MG capsule Generic drug: esomeprazole Take 40 mg by mouth in the morning.    nitroGLYCERIN 0.4 MG SL tablet Commonly known as:  NITROSTAT Place 1 tablet (0.4 mg total) under the tongue every 5 (five) minutes as needed for chest pain.    ondansetron 4 MG disintegrating tablet Commonly known as: ZOFRAN-ODT Take 1 tablet (4 mg total) by mouth every 6 (six) hours as needed for nausea or vomiting.    SUMAtriptan 100 MG tablet Commonly known as: IMITREX Take 100 mg by mouth daily as needed for migraine.       Vital Signs    There were no vitals filed for this visit.  Intake/Output Summary (Last 24 hours) at 11/26/2022 1505 Last data filed at 11/26/2022 1015 Gross per 24 hour  Intake 746.5 ml  Output --  Net 746.5 ml      11/25/2022    2:00 PM 11/24/2022    9:36 PM 11/24/2022   11:09 AM  Last 3 Weights  Weight (lbs) 116 lb 13.5 oz 116 lb 13.5 oz 124 lb  Weight (kg) 53 kg 53 kg 56.246 kg      Telemetry    Normal sinus rhythm- Personally Reviewed  ECG     - Personally Reviewed  Physical Exam   GEN: No acute distress.   Neck: No JVD Cardiac: RRR, no murmurs, rubs, or gallops.  Respiratory: Clear to auscultation bilaterally. GI: Soft, nontender, non-distended  MS: No edema; No deformity. Neuro:  Nonfocal  Psych: Normal affect   Labs    High Sensitivity Troponin:   Recent Labs  Lab 11/24/22 1231 11/24/22 1432  TROPONINIHS 1,798* 2,596*  Chemistry Recent Labs  Lab 11/24/22 1231 11/25/22 0753 11/26/22 0444  NA 136 137 136  K 3.8 3.7 3.9  CL 107 104 104  CO2 20* 25 24  GLUCOSE 77 94 87  BUN 9 10 14   CREATININE 0.53 0.60 0.62  CALCIUM 8.2* 9.0 8.8*  MG  --   --  2.1  GFRNONAA >60 >60 >60  ANIONGAP 9 8 8     Lipids  Recent Labs  Lab 11/25/22 0753  CHOL 201*  TRIG 133  HDL 73  LDLCALC 101*  CHOLHDL 2.8    Hematology Recent Labs  Lab 11/24/22 1231 11/25/22 0753 11/26/22 0444  WBC 9.5 7.0 6.6  RBC 4.68 4.91 4.32  HGB 14.1 15.0 13.1  HCT 43.3 44.2 39.2  MCV 92.5 90.0 90.7  MCH 30.1 30.5 30.3  MCHC 32.6 33.9 33.4  RDW 12.2 12.3 12.1  PLT 261 289 253    Thyroid  Recent Labs  Lab 11/24/22 1432  TSH 1.031    BNPNo results for input(s): "BNP", "PROBNP" in the last 168 hours.  DDimer No results for input(s): "DDIMER" in the last 168 hours.   Radiology    CARDIAC CATHETERIZATION  Result Date: 11/25/2022 Conclusions: Mild to moderate, nonobstructive coronary artery disease with 30-40% mid LAD stenosis.  No significant disease noted in the LMCA, LCx, or RCA. Mildly reduced left ventricular systolic function with mid left ventricular wall motion abnormality most consistent with a Takotsubo variant. Normal left ventricular filling pressure.  Recommendations: Escalate goal-directed medical therapy for nonischemic cardiomyopathy, likely Takotsubo variant. Continue medical therapy and risk factor modification to prevent progression of mild-moderate CAD. Yvonne Kendall, MD [facility]  ECHOCARDIOGRAM COMPLETE  Result Date: 11/25/2022    ECHOCARDIOGRAM REPORT   Patient Name:   Shannon [name] Date of Exam: 11/25/2022 Medical Rec #:  [id number]      Height:       62.0 in Accession #:    [id number]     Weight:       116.8 lb Date of Birth:  03/17/1965      BSA:          1.521 m Patient Age:    57 years       BP:           101/67 mmHg Patient Gender: F              HR:           73 bpm. Exam Location:  [facility] Procedure: 2D Echo, 3D Echo, Cardiac Doppler, Color Doppler and Strain Analysis Indications:     NSTEMI  History:         Patient has no prior history of Echocardiogram examinations.                  Acute MI; Signs/Symptoms:Chest Pain.  Sonographer:     Mikki Harbor Referring Phys:  [id number] AMY N COX Diagnosing Phys: Yvonne Kendall MD  Sonographer Comments: Global longitudinal strain was attempted. IMPRESSIONS  1. Left ventricular ejection fraction, by estimation, is 45 to 50%. The left ventricle has mildly decreased function. The left ventricle demonstrates regional wall motion abnormalities (see scoring diagram/findings for description). There is  mild left ventricular hypertrophy. Left ventricular diastolic parameters are consistent with Grade I diastolic dysfunction (impaired relaxation). There is severe hypokinesis of the left ventricular, mid-apical inferolateral wall. The average left ventricular global longitudinal strain is -12.5 %. The global longitudinal strain is abnormal.  2. Right ventricular systolic function  is normal. The right ventricular size is normal.  3. The mitral valve is normal in structure. No evidence of mitral valve regurgitation. No evidence of mitral stenosis.  4. The aortic valve is tricuspid. Aortic valve regurgitation is not visualized. No aortic stenosis is present.  5. The inferior vena cava is normal in size with greater than 50% respiratory variability, suggesting right atrial pressure of 3 mmHg. FINDINGS  Left Ventricle: Left ventricular ejection fraction, by estimation, is 45 to 50%. The left ventricle has mildly decreased function. The left ventricle demonstrates regional wall motion abnormalities. Severe hypokinesis of the left ventricular, mid-apical  inferolateral wall. The average left ventricular global longitudinal strain is -12.5 %. The global longitudinal strain is abnormal. The left ventricular internal cavity size was normal in size. There is mild left ventricular hypertrophy. Left ventricular diastolic parameters are consistent with Grade I diastolic dysfunction (impaired relaxation). Right Ventricle: The right ventricular size is normal. No increase in right ventricular wall thickness. Right ventricular systolic function is normal. Left Atrium: Left atrial size was normal in size. Right Atrium: Right atrial size was normal in size. Pericardium: There is no evidence of pericardial effusion. Mitral Valve: The mitral valve is normal in structure. No evidence of mitral valve regurgitation. No evidence of mitral valve stenosis. MV peak gradient, 3.5 mmHg. The mean mitral valve gradient is 1.0 mmHg. Tricuspid Valve:  The tricuspid valve is normal in structure. Tricuspid valve regurgitation is not demonstrated. Aortic Valve: The aortic valve is tricuspid. Aortic valve regurgitation is not visualized. No aortic stenosis is present. Aortic valve mean gradient measures 2.0 mmHg. Aortic valve peak gradient measures 4.0 mmHg. Aortic valve area, by VTI measures 2.26 cm. Pulmonic Valve: The pulmonic valve was normal in structure. Pulmonic valve regurgitation is not visualized. No evidence of pulmonic stenosis. Aorta: The aortic root is normal in size and structure. Pulmonary Artery: The pulmonary artery is of normal size. Venous: The inferior vena cava is normal in size with greater than 50% respiratory variability, suggesting right atrial pressure of 3 mmHg. IAS/Shunts: No atrial level shunt detected by color flow Doppler.  LEFT VENTRICLE PLAX 2D LVIDd:         3.70 cm     Diastology LVIDs:         2.90 cm     LV e' medial:    5.33 cm/s LV PW:         1.10 cm     LV E/e' medial:  8.2 LV IVS:        1.10 cm     LV e' lateral:   5.98 cm/s LVOT diam:     2.00 cm     LV E/e' lateral: 7.3 LV SV:         44 LV SV Index:   29          2D Longitudinal Strain LVOT Area:     3.14 cm    2D Strain GLS Avg:     -12.5 %  LV Volumes (MOD) LV vol d, MOD A2C: 39.5 ml LV vol d, MOD A4C: 54.9 ml LV vol s, MOD A2C: 20.9 ml LV vol s, MOD A4C: 28.8 ml LV SV MOD A2C:     18.6 ml LV SV MOD A4C:     54.9 ml LV SV MOD BP:      24.3 ml RIGHT VENTRICLE RV Basal diam:  2.45 cm RV Mid diam:    2.10 cm RV S prime:  11.20 cm/s LEFT ATRIUM           Index        RIGHT ATRIUM          Index LA diam:      2.80 cm 1.84 cm/m   RA Area:     8.53 cm LA Vol (A2C): 24.4 ml 16.04 ml/m  RA Volume:   16.30 ml 10.71 ml/m LA Vol (A4C): 19.3 ml 12.69 ml/m  AORTIC VALVE                    PULMONIC VALVE AV Area (Vmax):    2.45 cm     PV Vmax:       0.83 m/s AV Area (Vmean):   2.28 cm     PV Peak grad:  2.8 mmHg AV Area (VTI):     2.26 cm AV Vmax:           99.40 cm/s AV  Vmean:          65.800 cm/s AV VTI:            0.195 m AV Peak Grad:      4.0 mmHg AV Mean Grad:      2.0 mmHg LVOT Vmax:         77.40 cm/s LVOT Vmean:        47.700 cm/s LVOT VTI:          0.140 m LVOT/AV VTI ratio: 0.72  AORTA Ao Root diam: 3.20 cm MITRAL VALVE MV Area (PHT): 2.32 cm    SHUNTS MV Area VTI:   2.01 cm    Systemic VTI:  0.14 m MV Peak grad:  3.5 mmHg    Systemic Diam: 2.00 cm MV Mean grad:  1.0 mmHg MV Vmax:       0.94 m/s MV Vmean:      42.1 cm/s MV Decel Time: 327 msec MV E velocity: 43.50 cm/s MV A velocity: 87.50 cm/s MV E/A ratio:  0.50 Cristal Deer End MD Electronically signed by Yvonne Kendall MD Signature Date/Time: 11/25/2022/2:35:02 PM    Final     Cardiac Studies     Patient Profile     Shannon [name] is a 57 y.o. female with a hx of anxiety, GERD, hiatal hernia, hyperthyroidism s/p ablation, anemia, who is being seen 11/24/2022 for the evaluation of NSTEMI   Assessment & Plan    Non-STEMI Suspected stress cardiomyopathy Elevated troponin, peak 2600 (last measured) No culprit vessel noted on cardiac catheterization Mildly depressed ejection fraction 45% Currently asymptomatic Metoprolol succinate held for hypotension Unable to initiate ARB, beta-blocker, spironolactone given hypotension -Recommend aspirin, statin, Jardiance -Outpatient follow-up, if blood pressure improves may be able to consider low-dose beta-blocker  cardiac rehab -Precautions following cardiac catheterization discussed  Hyperlipidemia Recommend Lipitor 20 daily Goal LDL less than 70  Discharge instructions discussed with her and with family in detail  For questions or updates, please contact [facility] Please consult [email or web address] for contact info under        Signed, Julien Nordmann, MD  11/26/2022, 3:05 PM

## 2022-11-26 NOTE — Discharge Summary (Signed)
Physician Discharge Summary   Patient: Shannon [name] MRN: [id number] DOB: [birth year]  Admit date:     11/24/2022  Discharge date: 11/26/22  Discharge Physician: Arnetha Courser   PCP: Dale Shaniko, MD   Recommendations at discharge:  Please obtain CBC and BMP on follow-up Follow-up with cardiac rehab Follow-up with cardiology Follow-up with primary care provider  Discharge Diagnoses: Principal Problem:   NSTEMI (non-ST elevated myocardial infarction) [facility]) Active Problems:   Anxiety   Migraine   Tobacco abuse   Gastroesophageal reflux disease without esophagitis   Osteopenia   Stress-induced cardiomyopathy   QT prolongation   Hospital Course: [name] is a 57 year old female with history of anxiety, GERD, who presents emergency department for chief concerns of chest pain.  Vitals in the ED showed temperature of 98.7, respiration rate of 16, heart rate of 75, blood pressure 120/70, SpO2 99% room air.  Serum sodium is 136, potassium 3.8, chloride 107, bicarb 20, BUN of 9, serum creatinine of 0.53, EGFR greater than 60, nonfasting blood glucose 77, WBC 9.5, hemoglobin 14.1, platelets of 261.  High sensitive troponin was initially 1798 and on repeat was 2596.  CTA CAP for dissection: Read as no evidence of aortic aneurysm or dissection.  No acute localizing process in the chest, abdomen, pelvis.  ED treatment: Heparin GTT per pharmacy.  Cardiology was consulted, tentative plan for left heart cath in the a.m. for concern of NSTEMI  10/29: Vital stable, lipid panel with mildly elevated total cholesterol at 201 and LDL of 101. Echocardiogram with EF of 45 to 50%, regional wall motion abnormalities and mild LVH.  Grade 1 diastolic dysfunction and severe hypokinesis of left ventricular, mid apical inferolateral wall. Going for cardiac catheterization later today.  10/30; vital stable, had cardiac catheterization yesterday and found to have nonobstructive coronary disease  and wall motion abnormalities most consistent with Takotsubo variant. Patient was started on aspirin, Jardiance and Lipitor.  Unable to add metoprolol and Entresto due to softer blood pressure.  Patient will need a close follow-up by cardiology and they will add heart failure medications as appropriate.  Patient remained stable with no chest pain or shortness of breath.  Able to ambulate and is being discharged home with a close follow-up.  She will also participate with cardiac rehab as outpatient.  Patient will continue on current medications and follow-up with her providers for further recommendations.   Assessment and Plan: * NSTEMI (non-ST elevated myocardial infarction) (HCC) Takotsubo cardiomyopathy. Echocardiogram with EF of 45 to 50% with regional wall motion abnormalities and severe hypokinesis of mid apical, inferolateral wall. Cardiac catheterization with nonobstructive disease and concern of Takotsubo cardiomyopathy.  Patient was started on low-dose aspirin, cholesterol and Farxiga. Unable to add metoprolol, Entresto or any diuretic due to softer blood pressure and they can be added as outpatient as appropriate and once blood pressure allows.  Anxiety Home alprazolam 0.125 mg daily prn for anxiety resumed  Migraine Home sumatriptan not resumed on admission pending LHC. Sumatriptan is contraindicated in patients with CAD  Tobacco abuse Patient quit 20+ years ago  Gastroesophageal reflux disease without esophagitis Home PPI equivalent resumed  Consultants: Cardiology Procedures performed: Left heart catheterization Disposition: Home Diet recommendation:  Discharge Diet Orders (From admission, onward)     Start     Ordered   11/26/22 0000  Diet - low sodium heart healthy        11/26/22 1152           Cardiac diet  Physician Discharge Summary   Patient: Shannon [name] MRN: [id number] DOB: [birth year]  Admit date:     11/24/2022  Discharge date: 11/26/22  Discharge Physician: Arnetha Courser   PCP: Dale Shaniko, MD   Recommendations at discharge:  Please obtain CBC and BMP on follow-up Follow-up with cardiac rehab Follow-up with cardiology Follow-up with primary care provider  Discharge Diagnoses: Principal Problem:   NSTEMI (non-ST elevated myocardial infarction) [facility]) Active Problems:   Anxiety   Migraine   Tobacco abuse   Gastroesophageal reflux disease without esophagitis   Osteopenia   Stress-induced cardiomyopathy   QT prolongation   Hospital Course: [name] is a 57 year old female with history of anxiety, GERD, who presents emergency department for chief concerns of chest pain.  Vitals in the ED showed temperature of 98.7, respiration rate of 16, heart rate of 75, blood pressure 120/70, SpO2 99% room air.  Serum sodium is 136, potassium 3.8, chloride 107, bicarb 20, BUN of 9, serum creatinine of 0.53, EGFR greater than 60, nonfasting blood glucose 77, WBC 9.5, hemoglobin 14.1, platelets of 261.  High sensitive troponin was initially 1798 and on repeat was 2596.  CTA CAP for dissection: Read as no evidence of aortic aneurysm or dissection.  No acute localizing process in the chest, abdomen, pelvis.  ED treatment: Heparin GTT per pharmacy.  Cardiology was consulted, tentative plan for left heart cath in the a.m. for concern of NSTEMI  10/29: Vital stable, lipid panel with mildly elevated total cholesterol at 201 and LDL of 101. Echocardiogram with EF of 45 to 50%, regional wall motion abnormalities and mild LVH.  Grade 1 diastolic dysfunction and severe hypokinesis of left ventricular, mid apical inferolateral wall. Going for cardiac catheterization later today.  10/30; vital stable, had cardiac catheterization yesterday and found to have nonobstructive coronary disease  and wall motion abnormalities most consistent with Takotsubo variant. Patient was started on aspirin, Jardiance and Lipitor.  Unable to add metoprolol and Entresto due to softer blood pressure.  Patient will need a close follow-up by cardiology and they will add heart failure medications as appropriate.  Patient remained stable with no chest pain or shortness of breath.  Able to ambulate and is being discharged home with a close follow-up.  She will also participate with cardiac rehab as outpatient.  Patient will continue on current medications and follow-up with her providers for further recommendations.   Assessment and Plan: * NSTEMI (non-ST elevated myocardial infarction) (HCC) Takotsubo cardiomyopathy. Echocardiogram with EF of 45 to 50% with regional wall motion abnormalities and severe hypokinesis of mid apical, inferolateral wall. Cardiac catheterization with nonobstructive disease and concern of Takotsubo cardiomyopathy.  Patient was started on low-dose aspirin, cholesterol and Farxiga. Unable to add metoprolol, Entresto or any diuretic due to softer blood pressure and they can be added as outpatient as appropriate and once blood pressure allows.  Anxiety Home alprazolam 0.125 mg daily prn for anxiety resumed  Migraine Home sumatriptan not resumed on admission pending LHC. Sumatriptan is contraindicated in patients with CAD  Tobacco abuse Patient quit 20+ years ago  Gastroesophageal reflux disease without esophagitis Home PPI equivalent resumed  Consultants: Cardiology Procedures performed: Left heart catheterization Disposition: Home Diet recommendation:  Discharge Diet Orders (From admission, onward)     Start     Ordered   11/26/22 0000  Diet - low sodium heart healthy        11/26/22 1152           Cardiac diet  Mildly reduced left ventricular systolic function with mid left ventricular wall motion abnormality most consistent with a Takotsubo variant. Normal left ventricular filling pressure.  Recommendations: Escalate goal-directed medical therapy for nonischemic cardiomyopathy, likely Takotsubo variant. Continue medical therapy and risk factor modification to prevent progression of mild-moderate CAD. Yvonne Kendall, MD [facility]  ECHOCARDIOGRAM COMPLETE  Result Date: 11/25/2022    ECHOCARDIOGRAM REPORT   Patient Name:   Shannon [name] Date of Exam: 11/25/2022 Medical Rec #:  [id number]      Height:       62.0 in Accession #:    [id number]      Weight:       116.8 lb Date of Birth:  [birth year]      BSA:          1.521 m Patient Age:    57 years       BP:           101/67 mmHg Patient Gender: F              HR:           73 bpm. Exam Location:  [facility] Procedure: 2D Echo, 3D Echo, Cardiac Doppler, Color Doppler and Strain Analysis Indications:     NSTEMI  History:         Patient has no prior history of Echocardiogram examinations.                  Acute MI; Signs/Symptoms:Chest Pain.  Sonographer:     Mikki Harbor Referring Phys:  [id number] AMY N [facility] Diagnosing Phys: Yvonne Kendall MD  Sonographer Comments: Global longitudinal strain was attempted. IMPRESSIONS  1. Left ventricular ejection fraction, by estimation, is 45 to 50%. The left ventricle has mildly decreased function. The left ventricle demonstrates regional wall motion abnormalities (see scoring diagram/findings for description). There is mild left ventricular hypertrophy. Left ventricular diastolic parameters are consistent with Grade I diastolic dysfunction (impaired relaxation). There is severe hypokinesis of the left ventricular, mid-apical inferolateral wall. The average left ventricular global longitudinal strain is -12.5 %. The global longitudinal strain is abnormal.  2. Right ventricular systolic function is normal. The right ventricular size is normal.  3. The mitral valve is normal in structure. No evidence of mitral valve regurgitation. No evidence of mitral stenosis.  4. The aortic valve is tricuspid. Aortic valve regurgitation is not visualized. No aortic stenosis is present.  5. The inferior vena cava is normal in size with greater than 50% respiratory variability, suggesting right atrial pressure of 3 mmHg. FINDINGS  Left Ventricle: Left ventricular ejection fraction, by estimation, is 45 to 50%. The left ventricle has mildly decreased function. The left ventricle demonstrates regional wall motion abnormalities. Severe hypokinesis of the left ventricular, mid-apical   inferolateral wall. The average left ventricular global longitudinal strain is -12.5 %. The global longitudinal strain is abnormal. The left ventricular internal cavity size was normal in size. There is mild left ventricular hypertrophy. Left ventricular diastolic parameters are consistent with Grade I diastolic dysfunction (impaired relaxation). Right Ventricle: The right ventricular size is normal. No increase in right ventricular wall thickness. Right ventricular systolic function is normal. Left Atrium: Left atrial size was normal in size. Right Atrium: Right atrial size was normal in size. Pericardium: There is no evidence of pericardial effusion. Mitral Valve: The mitral valve is normal in structure. No evidence of mitral valve regurgitation. No evidence of mitral valve stenosis. MV peak gradient, 3.5 mmHg. The mean mitral valve  Physician Discharge Summary   Patient: Shannon [name] MRN: [id number] DOB: [birth year]  Admit date:     11/24/2022  Discharge date: 11/26/22  Discharge Physician: Arnetha Courser   PCP: Dale Shaniko, MD   Recommendations at discharge:  Please obtain CBC and BMP on follow-up Follow-up with cardiac rehab Follow-up with cardiology Follow-up with primary care provider  Discharge Diagnoses: Principal Problem:   NSTEMI (non-ST elevated myocardial infarction) [facility]) Active Problems:   Anxiety   Migraine   Tobacco abuse   Gastroesophageal reflux disease without esophagitis   Osteopenia   Stress-induced cardiomyopathy   QT prolongation   Hospital Course: [name] is a 57 year old female with history of anxiety, GERD, who presents emergency department for chief concerns of chest pain.  Vitals in the ED showed temperature of 98.7, respiration rate of 16, heart rate of 75, blood pressure 120/70, SpO2 99% room air.  Serum sodium is 136, potassium 3.8, chloride 107, bicarb 20, BUN of 9, serum creatinine of 0.53, EGFR greater than 60, nonfasting blood glucose 77, WBC 9.5, hemoglobin 14.1, platelets of 261.  High sensitive troponin was initially 1798 and on repeat was 2596.  CTA CAP for dissection: Read as no evidence of aortic aneurysm or dissection.  No acute localizing process in the chest, abdomen, pelvis.  ED treatment: Heparin GTT per pharmacy.  Cardiology was consulted, tentative plan for left heart cath in the a.m. for concern of NSTEMI  10/29: Vital stable, lipid panel with mildly elevated total cholesterol at 201 and LDL of 101. Echocardiogram with EF of 45 to 50%, regional wall motion abnormalities and mild LVH.  Grade 1 diastolic dysfunction and severe hypokinesis of left ventricular, mid apical inferolateral wall. Going for cardiac catheterization later today.  10/30; vital stable, had cardiac catheterization yesterday and found to have nonobstructive coronary disease  and wall motion abnormalities most consistent with Takotsubo variant. Patient was started on aspirin, Jardiance and Lipitor.  Unable to add metoprolol and Entresto due to softer blood pressure.  Patient will need a close follow-up by cardiology and they will add heart failure medications as appropriate.  Patient remained stable with no chest pain or shortness of breath.  Able to ambulate and is being discharged home with a close follow-up.  She will also participate with cardiac rehab as outpatient.  Patient will continue on current medications and follow-up with her providers for further recommendations.   Assessment and Plan: * NSTEMI (non-ST elevated myocardial infarction) (HCC) Takotsubo cardiomyopathy. Echocardiogram with EF of 45 to 50% with regional wall motion abnormalities and severe hypokinesis of mid apical, inferolateral wall. Cardiac catheterization with nonobstructive disease and concern of Takotsubo cardiomyopathy.  Patient was started on low-dose aspirin, cholesterol and Farxiga. Unable to add metoprolol, Entresto or any diuretic due to softer blood pressure and they can be added as outpatient as appropriate and once blood pressure allows.  Anxiety Home alprazolam 0.125 mg daily prn for anxiety resumed  Migraine Home sumatriptan not resumed on admission pending LHC. Sumatriptan is contraindicated in patients with CAD  Tobacco abuse Patient quit 20+ years ago  Gastroesophageal reflux disease without esophagitis Home PPI equivalent resumed  Consultants: Cardiology Procedures performed: Left heart catheterization Disposition: Home Diet recommendation:  Discharge Diet Orders (From admission, onward)     Start     Ordered   11/26/22 0000  Diet - low sodium heart healthy        11/26/22 1152           Cardiac diet  Mildly reduced left ventricular systolic function with mid left ventricular wall motion abnormality most consistent with a Takotsubo variant. Normal left ventricular filling pressure.  Recommendations: Escalate goal-directed medical therapy for nonischemic cardiomyopathy, likely Takotsubo variant. Continue medical therapy and risk factor modification to prevent progression of mild-moderate CAD. Yvonne Kendall, MD [facility]  ECHOCARDIOGRAM COMPLETE  Result Date: 11/25/2022    ECHOCARDIOGRAM REPORT   Patient Name:   Shannon [name] Date of Exam: 11/25/2022 Medical Rec #:  [id number]      Height:       62.0 in Accession #:    [id number]      Weight:       116.8 lb Date of Birth:  [birth year]      BSA:          1.521 m Patient Age:    57 years       BP:           101/67 mmHg Patient Gender: F              HR:           73 bpm. Exam Location:  [facility] Procedure: 2D Echo, 3D Echo, Cardiac Doppler, Color Doppler and Strain Analysis Indications:     NSTEMI  History:         Patient has no prior history of Echocardiogram examinations.                  Acute MI; Signs/Symptoms:Chest Pain.  Sonographer:     Mikki Harbor Referring Phys:  [id number] AMY N [facility] Diagnosing Phys: Yvonne Kendall MD  Sonographer Comments: Global longitudinal strain was attempted. IMPRESSIONS  1. Left ventricular ejection fraction, by estimation, is 45 to 50%. The left ventricle has mildly decreased function. The left ventricle demonstrates regional wall motion abnormalities (see scoring diagram/findings for description). There is mild left ventricular hypertrophy. Left ventricular diastolic parameters are consistent with Grade I diastolic dysfunction (impaired relaxation). There is severe hypokinesis of the left ventricular, mid-apical inferolateral wall. The average left ventricular global longitudinal strain is -12.5 %. The global longitudinal strain is abnormal.  2. Right ventricular systolic function is normal. The right ventricular size is normal.  3. The mitral valve is normal in structure. No evidence of mitral valve regurgitation. No evidence of mitral stenosis.  4. The aortic valve is tricuspid. Aortic valve regurgitation is not visualized. No aortic stenosis is present.  5. The inferior vena cava is normal in size with greater than 50% respiratory variability, suggesting right atrial pressure of 3 mmHg. FINDINGS  Left Ventricle: Left ventricular ejection fraction, by estimation, is 45 to 50%. The left ventricle has mildly decreased function. The left ventricle demonstrates regional wall motion abnormalities. Severe hypokinesis of the left ventricular, mid-apical   inferolateral wall. The average left ventricular global longitudinal strain is -12.5 %. The global longitudinal strain is abnormal. The left ventricular internal cavity size was normal in size. There is mild left ventricular hypertrophy. Left ventricular diastolic parameters are consistent with Grade I diastolic dysfunction (impaired relaxation). Right Ventricle: The right ventricular size is normal. No increase in right ventricular wall thickness. Right ventricular systolic function is normal. Left Atrium: Left atrial size was normal in size. Right Atrium: Right atrial size was normal in size. Pericardium: There is no evidence of pericardial effusion. Mitral Valve: The mitral valve is normal in structure. No evidence of mitral valve regurgitation. No evidence of mitral valve stenosis. MV peak gradient, 3.5 mmHg. The mean mitral valve  Mildly reduced left ventricular systolic function with mid left ventricular wall motion abnormality most consistent with a Takotsubo variant. Normal left ventricular filling pressure.  Recommendations: Escalate goal-directed medical therapy for nonischemic cardiomyopathy, likely Takotsubo variant. Continue medical therapy and risk factor modification to prevent progression of mild-moderate CAD. Yvonne Kendall, MD [facility]  ECHOCARDIOGRAM COMPLETE  Result Date: 11/25/2022    ECHOCARDIOGRAM REPORT   Patient Name:   Shannon [name] Date of Exam: 11/25/2022 Medical Rec #:  [id number]      Height:       62.0 in Accession #:    [id number]      Weight:       116.8 lb Date of Birth:  [birth year]      BSA:          1.521 m Patient Age:    57 years       BP:           101/67 mmHg Patient Gender: F              HR:           73 bpm. Exam Location:  [facility] Procedure: 2D Echo, 3D Echo, Cardiac Doppler, Color Doppler and Strain Analysis Indications:     NSTEMI  History:         Patient has no prior history of Echocardiogram examinations.                  Acute MI; Signs/Symptoms:Chest Pain.  Sonographer:     Mikki Harbor Referring Phys:  [id number] AMY N [facility] Diagnosing Phys: Yvonne Kendall MD  Sonographer Comments: Global longitudinal strain was attempted. IMPRESSIONS  1. Left ventricular ejection fraction, by estimation, is 45 to 50%. The left ventricle has mildly decreased function. The left ventricle demonstrates regional wall motion abnormalities (see scoring diagram/findings for description). There is mild left ventricular hypertrophy. Left ventricular diastolic parameters are consistent with Grade I diastolic dysfunction (impaired relaxation). There is severe hypokinesis of the left ventricular, mid-apical inferolateral wall. The average left ventricular global longitudinal strain is -12.5 %. The global longitudinal strain is abnormal.  2. Right ventricular systolic function is normal. The right ventricular size is normal.  3. The mitral valve is normal in structure. No evidence of mitral valve regurgitation. No evidence of mitral stenosis.  4. The aortic valve is tricuspid. Aortic valve regurgitation is not visualized. No aortic stenosis is present.  5. The inferior vena cava is normal in size with greater than 50% respiratory variability, suggesting right atrial pressure of 3 mmHg. FINDINGS  Left Ventricle: Left ventricular ejection fraction, by estimation, is 45 to 50%. The left ventricle has mildly decreased function. The left ventricle demonstrates regional wall motion abnormalities. Severe hypokinesis of the left ventricular, mid-apical   inferolateral wall. The average left ventricular global longitudinal strain is -12.5 %. The global longitudinal strain is abnormal. The left ventricular internal cavity size was normal in size. There is mild left ventricular hypertrophy. Left ventricular diastolic parameters are consistent with Grade I diastolic dysfunction (impaired relaxation). Right Ventricle: The right ventricular size is normal. No increase in right ventricular wall thickness. Right ventricular systolic function is normal. Left Atrium: Left atrial size was normal in size. Right Atrium: Right atrial size was normal in size. Pericardium: There is no evidence of pericardial effusion. Mitral Valve: The mitral valve is normal in structure. No evidence of mitral valve regurgitation. No evidence of mitral valve stenosis. MV peak gradient, 3.5 mmHg. The mean mitral valve  Physician Discharge Summary   Patient: Shannon [name] MRN: [id number] DOB: [birth year]  Admit date:     11/24/2022  Discharge date: 11/26/22  Discharge Physician: Arnetha Courser   PCP: Dale Shaniko, MD   Recommendations at discharge:  Please obtain CBC and BMP on follow-up Follow-up with cardiac rehab Follow-up with cardiology Follow-up with primary care provider  Discharge Diagnoses: Principal Problem:   NSTEMI (non-ST elevated myocardial infarction) [facility]) Active Problems:   Anxiety   Migraine   Tobacco abuse   Gastroesophageal reflux disease without esophagitis   Osteopenia   Stress-induced cardiomyopathy   QT prolongation   Hospital Course: [name] is a 57 year old female with history of anxiety, GERD, who presents emergency department for chief concerns of chest pain.  Vitals in the ED showed temperature of 98.7, respiration rate of 16, heart rate of 75, blood pressure 120/70, SpO2 99% room air.  Serum sodium is 136, potassium 3.8, chloride 107, bicarb 20, BUN of 9, serum creatinine of 0.53, EGFR greater than 60, nonfasting blood glucose 77, WBC 9.5, hemoglobin 14.1, platelets of 261.  High sensitive troponin was initially 1798 and on repeat was 2596.  CTA CAP for dissection: Read as no evidence of aortic aneurysm or dissection.  No acute localizing process in the chest, abdomen, pelvis.  ED treatment: Heparin GTT per pharmacy.  Cardiology was consulted, tentative plan for left heart cath in the a.m. for concern of NSTEMI  10/29: Vital stable, lipid panel with mildly elevated total cholesterol at 201 and LDL of 101. Echocardiogram with EF of 45 to 50%, regional wall motion abnormalities and mild LVH.  Grade 1 diastolic dysfunction and severe hypokinesis of left ventricular, mid apical inferolateral wall. Going for cardiac catheterization later today.  10/30; vital stable, had cardiac catheterization yesterday and found to have nonobstructive coronary disease  and wall motion abnormalities most consistent with Takotsubo variant. Patient was started on aspirin, Jardiance and Lipitor.  Unable to add metoprolol and Entresto due to softer blood pressure.  Patient will need a close follow-up by cardiology and they will add heart failure medications as appropriate.  Patient remained stable with no chest pain or shortness of breath.  Able to ambulate and is being discharged home with a close follow-up.  She will also participate with cardiac rehab as outpatient.  Patient will continue on current medications and follow-up with her providers for further recommendations.   Assessment and Plan: * NSTEMI (non-ST elevated myocardial infarction) (HCC) Takotsubo cardiomyopathy. Echocardiogram with EF of 45 to 50% with regional wall motion abnormalities and severe hypokinesis of mid apical, inferolateral wall. Cardiac catheterization with nonobstructive disease and concern of Takotsubo cardiomyopathy.  Patient was started on low-dose aspirin, cholesterol and Farxiga. Unable to add metoprolol, Entresto or any diuretic due to softer blood pressure and they can be added as outpatient as appropriate and once blood pressure allows.  Anxiety Home alprazolam 0.125 mg daily prn for anxiety resumed  Migraine Home sumatriptan not resumed on admission pending LHC. Sumatriptan is contraindicated in patients with CAD  Tobacco abuse Patient quit 20+ years ago  Gastroesophageal reflux disease without esophagitis Home PPI equivalent resumed  Consultants: Cardiology Procedures performed: Left heart catheterization Disposition: Home Diet recommendation:  Discharge Diet Orders (From admission, onward)     Start     Ordered   11/26/22 0000  Diet - low sodium heart healthy        11/26/22 1152           Cardiac diet

## 2022-11-26 NOTE — Plan of Care (Signed)
  Problem: Education: Goal: Knowledge of General Education information will improve Description: Including pain rating scale, medication(s)/side effects and non-pharmacologic comfort measures Outcome: Adequate for Discharge   Problem: Health Behavior/Discharge Planning: Goal: Ability to manage health-related needs will improve Outcome: Adequate for Discharge   Problem: Clinical Measurements: Goal: Ability to maintain clinical measurements within normal limits will improve Outcome: Adequate for Discharge Goal: Will remain free from infection Outcome: Adequate for Discharge Goal: Diagnostic test results will improve Outcome: Adequate for Discharge Goal: Respiratory complications will improve Outcome: Adequate for Discharge Goal: Cardiovascular complication will be avoided Outcome: Adequate for Discharge   Problem: Activity: Goal: Risk for activity intolerance will decrease Outcome: Adequate for Discharge   Problem: Nutrition: Goal: Adequate nutrition will be maintained Outcome: Adequate for Discharge   Problem: Coping: Goal: Level of anxiety will decrease Outcome: Adequate for Discharge   Problem: Elimination: Goal: Will not experience complications related to bowel motility Outcome: Adequate for Discharge Goal: Will not experience complications related to urinary retention Outcome: Adequate for Discharge   Problem: Pain Management: Goal: General experience of comfort will improve Outcome: Adequate for Discharge   Problem: Safety: Goal: Ability to remain free from injury will improve Outcome: Adequate for Discharge   Problem: Skin Integrity: Goal: Risk for impaired skin integrity will decrease Outcome: Adequate for Discharge   Problem: Education: Goal: Understanding of CV disease, CV risk reduction, and recovery process will improve Outcome: Adequate for Discharge Goal: Individualized Educational Video(s) Outcome: Adequate for Discharge   Problem:  Activity: Goal: Ability to return to baseline activity level will improve Outcome: Adequate for Discharge   Problem: Cardiovascular: Goal: Ability to achieve and maintain adequate cardiovascular perfusion will improve Outcome: Adequate for Discharge Goal: Vascular access site(s) Level 0-1 will be maintained Outcome: Adequate for Discharge   Problem: Health Behavior/Discharge Planning: Goal: Ability to safely manage health-related needs after discharge will improve Outcome: Adequate for Discharge

## 2022-11-26 NOTE — TOC Benefit Eligibility Note (Signed)
Patient Product/process development scientist completed.    The patient is insured through [facility]. Patient has [employer], may use a copay card, and/or apply for patient assistance if available.    Ran test claim for Farxiga 10 mg and the current 30 day co-pay is $0.00.  Ran test claim for Jardiance 10 mg and the current 30 day co-pay is $0.00.  This test claim was processed through [facility]- copay amounts may vary at other pharmacies due to pharmacy/plan contracts, or as the patient moves through the different stages of their insurance plan.     Roland Earl, CPHT Pharmacy Technician III Certified Patient Advocate [facility] Patient Advocate Team Direct Number: [phone]  Fax: [phone]

## 2022-11-27 ENCOUNTER — Telehealth: Payer: Self-pay

## 2022-11-27 LAB — LIPOPROTEIN A (LPA): Lipoprotein (a): 20.1 nmol/L (ref <75.0)

## 2022-11-27 NOTE — Transitions of Care (Post Inpatient/ED Visit) (Signed)
11/27/2022  Name: Shannon [name] MRN: [id number] DOB: 1965-06-26  Today's TOC FU Call Status: Today's TOC FU Call Status:: Successful TOC FU Call Completed TOC FU Call Complete Date: 11/27/22 Patient's Name and Date of Birth confirmed.  Transition Care Management Follow-up Telephone Call Date of Discharge: 11/26/22 Discharge Facility: [facility]) Type of Discharge: Inpatient Admission Primary Inpatient Discharge Diagnosis:: NSTEMI How have you been since you were released from the hospital?: Better Any questions or concerns?: No  Items Reviewed: Did you receive and understand the discharge instructions provided?: Yes Medications obtained,verified, and reconciled?: Yes (Medications Reviewed) Any new allergies since your discharge?: No Dietary orders reviewed?: NA Do you have support at home?: No  Medications Reviewed Today: Medications Reviewed Today     Reviewed by Redge Gainer, RN (Case Manager) on 11/27/22 at 1108  Med List Status: <None>   Medication Order Taking? Sig Documenting Provider Last Dose Status Informant  acetaminophen (TYLENOL) 500 MG tablet [id number] No Take 1,000 mg by mouth every 6 (six) hours as needed for moderate pain (pain score 4-6) or mild pain (pain score 1-3). [provider] Unknown PRN Active Self           Med Note Vivia Ewing, DENA L   Mon Mar 29, 2018  1:00 PM)    ALPRAZolam (XANAX) 0.25 MG tablet [id number] No TAKE ONE TABLET DAILY IF NEEDED FOR ANXIETY  Patient taking differently: Take 0.125 mg by mouth daily as needed for anxiety.   Dale [place], MD Unknown PRN Active Self  aspirin EC 81 MG tablet [id number]  Take 1 tablet (81 mg total) by mouth daily. Swallow whole. Arnetha Courser, MD  Active   atorvastatin (LIPITOR) 20 MG tablet [id number]  Take 1 tablet (20 mg total) by mouth daily. Arnetha Courser, MD  Active   cholecalciferol (VITAMIN D3) 25 MCG (1000 UNIT) tablet [id number] No Take 1,000 Units by mouth  daily. [provider] Taking Active Self  Cyanocobalamin 1000 MCG/ML KIT [id number]  Inject 1,000 mcg as directed every 30 (thirty) days. [provider]  Active Self  empagliflozin (JARDIANCE) 10 MG TABS tablet [id number]  Take 1 tablet (10 mg total) by mouth daily. Arnetha Courser, MD  Active   EPINEPHrine 0.3 mg/0.3 mL IJ SOAJ injection [id number] No Inject 0.3 mg into the muscle as needed for anaphylaxis. [provider] Unknown PRN Active Pharmacy Records  esomeprazole (GNP ESOMEPRAZOLE MAGNESIUM) 20 MG capsule [id number]  Take 40 mg by mouth in the morning. [provider]  Active Self  nitroGLYCERIN (NITROSTAT) 0.4 MG SL tablet [id number]  Place 1 tablet (0.4 mg total) under the tongue every 5 (five) minutes as needed for chest pain. Antonieta Iba, MD  Active   ondansetron (ZOFRAN-ODT) 4 MG disintegrating tablet [id number] No Take 1 tablet (4 mg total) by mouth every 6 (six) hours as needed for nausea or vomiting. Ward, Layla Maw, DO Unknown PRN Active Pharmacy Records  SUMAtriptan (IMITREX) 100 MG tablet [id number] No Take 100 mg by mouth daily as needed for migraine. [provider] Unknown PRN Active Pharmacy Records            Home Care and Equipment/Supplies: Were Home Health Services Ordered?: NA Any new equipment or medical supplies ordered?: NA  Functional Questionnaire: Do you need assistance with bathing/showering or dressing?: No Do you need assistance with meal preparation?: No Do you need assistance with eating?: No Do you have difficulty maintaining continence: No Do you need assistance with getting  out of bed/getting out of a chair/moving?: No Do you have difficulty managing or taking your medications?: No  Follow up appointments reviewed: PCP Follow-up appointment confirmed?: Yes Date of PCP follow-up appointment?: 12/11/22 Follow-up Provider: Dale Blaine Specialist [facility] Follow-up appointment confirmed?: NA Do you  need transportation to your follow-up appointment?: No Do you understand care options if your condition(s) worsen?: Yes-patient verbalized understanding  SDOH Interventions Today    Flowsheet Row Most Recent Value  SDOH Interventions   Food Insecurity Interventions Intervention Not Indicated  Transportation Interventions Intervention Not Indicated      The patient declined the 30 day enrollment  Deidre Ala, RN RN Care Manager [facility] [phone]

## 2022-12-03 NOTE — Progress Notes (Signed)
Cardiology Clinic Note   Date: 12/05/2022 ID: Shannon [name], Shannon [name] 10/05/1965, MRN [id number]  Primary Cardiologist:  Yvonne Kendall, MD  Patient Profile    Shannon [name] is a 57 y.o. female who presents to the clinic today for hospital follow up.     Past medical history significant for: CAD. LHC 11/25/2022 (NSTEMI): Mild to moderate nonobstructive CAD.  30 to 40% mid LAD.  Mildly reduced LV function with mid LV wall motion abnormality most consistent with Takotsubo. Recommend escalation of GDMT for nonischemic cardiomyopathy. Stress cardiomyopathy. Echo 11/25/2022: EF 45 to 50%.  Severe hypokinesis of the left ventricular, mid-- apical inferolateral wall.  Mild LVH.  Grade I DD.  Normal RV size and function.  No significant valvular abnormalities. Hyperlipidemia. Lipid panel 11/25/2022: LDL 101, HDL 73, TG 133, total 201. LPa 11/26/2022: 20.1. GERD. Hyperthyroidism.  In summary, patient presented to the ED via EMS from PCP with complaints of chest pain.  Pain described as constant in the epigastrium and radiating to shoulders relieved by Nexium.  EKG showed new inferolateral T wave inversions.  X-ray showed no active cardiopulmonary disease.  CTA chest abdomen pelvis showed no aortic aneurysm/dissection no acute process in chest abdomen pelvis.  Incidental finding of right thyroid nodule and enlarged thyroid measuring 2.2 cm.  Initial labs: Potassium 3.8, calcium 8.2, creatinine 0.53, hemoglobin 14.1, TSH 1.031.  Troponin 1798>> 2596.  Patient underwent heart catheterization which showed mild to moderate nonobstructive CAD and wall motion abnormalities consistent with Takotsubo.  Patient was discharged on 11/26/2022.  Unable to add metoprolol or Entresto secondary to soft BP.     History of Present Illness    Shannon [name] is followed by Dr. Okey Dupre for the above outlined history.   Discussed the use of AI scribe software for clinical note transcription with the patient, who gave  verbal consent to proceed.  Patient is accompanied by her daughter. She reports doing well since hospital discharge. Patient denies shortness of breath, dyspnea on exertion, lower extremity edema, orthopnea or PND. No chest pain, pressure, or tightness. No palpitations. She is tolerating Jardiance well. She has not yet started Lipitor due to concerns about potential side effects. She reports being very sensitive to medications. She was supposed to have carpal tunnel surgery this week but it has been postponed due to the recent heart attack. She also gets allergy shots, and her last one was deferred secondary to her heart attack. No contraindication to receiving allergy shots.   She plans on deferring her carpal tunnel surgery until the beginning of the new year. Prior to her hospitalization she was walking and resistance training a few times a week. She is interested in starting cardiac rehab. As long as she is able to tolerate physical activity and advance exercise as able, she can proceed with surgery without further cardiac testing.   Patient complains of mild medial upper arm pain described as burning since catheterization. This discomfort is different from carpal tunnel pain. Her right wrist at cath site is well healed with no drainage, redness, or swelling.        ROS: All other systems reviewed and are otherwise negative except as noted in History of Present Illness.  Studies Reviewed    EKG Interpretation Date/Time:  Friday December 05 2022 08:02:31 EST Ventricular Rate:  80 PR Interval:  160 QRS Duration:  76 QT Interval:  370 QTC Calculation: 426 R Axis:   33  Text Interpretation: Normal sinus rhythm T wave  abnormality, consider inferolateral ischemia When compared with ECG of 25-Nov-2022 17:14, No significant changes Confirmed by Carlos Levering [phone]) on 12/05/2022 8:06:36 AM           Physical Exam    VS:  BP 120/80 (BP Location: Left Arm, Patient Position: Sitting, Cuff  Size: Normal)   Pulse 80   Ht 5\' 2"  (1.575 m)   Wt 124 lb (56.2 kg)   LMP 02/12/2013   SpO2 97%   BMI 22.68 kg/m  , BMI Body mass index is 22.68 kg/m.  GEN: Well nourished, well developed, in no acute distress. Neck: No JVD or carotid bruits. Cardiac:  RRR. No murmurs. No rubs or gallops.   Respiratory:  Respirations regular and unlabored. Clear to auscultation without rales, wheezing or rhonchi. GI: Soft, nontender, nondistended. Extremities: Radials/DP/PT 2+ and equal bilaterally. No clubbing or cyanosis. No edema. Right wrist cath site well healed without drainage, erythema or edema.  Skin: Warm and dry, no rash. Neuro: Strength intact.  Assessment & Plan       CAD LHC 11/25/2022 showed mild to moderate nonobstructive CAD and wall motion abnormalities consistent with Takotsubo.  Patient denies chest pain, pressure or tightness.  Patient reports mild medial upper arm pain since catheterization. Right wrist site well healed without drainage, erythema, or edema. 2+ radial pulse. -Continue aspirin, as needed SL NTG. -Patient instructed to contact the office if arm pain persists.  -Recommend starting atorvastatin. Suggested taking it every other day to determine tolerance. If intolerant she is instructed to contact the office.  -Start Toprol 12.5 mg in the evening.   Stress cardiomyopathy Echo 11/25/2022 showed EF 45 to 50%, wall motion abnormalities, Grade I DD.  Patient denies lower extremity edema, DOE, orthopnea or PND.  Euvolemic and well compensated on exam. -Continue Jardiance. -Start Toprol as above.   Hyperlipidemia LDL October 2024 101, not at goal. She has not started atorvastatin secondary to concern of side effects. She inquires if she is able to control cholesterol with diet. She already follows a heart healthy diet. Discussed Mediterranean diet. Educated patient on LDL goal of <70. -Recommend starting atorvastatin. Can take every other day initially to gauge  tolerance. She is instructed to contact the office if develops myalgias. Would consider trying low dose rosuvastatin at that point.  -Repeat lipid panel and LFTs at follow up.   General Health Maintenance -Continue allergy shots as usual.     Disposition: Return in 3 months or sooner as needed.     Cardiac Rehabilitation Eligibility Assessment  The patient is ready to start cardiac rehabilitation from a cardiac standpoint.        Signed, Etta Grandchild. Manna Gose, DNP, NP-C

## 2022-12-05 ENCOUNTER — Encounter: Payer: Self-pay | Admitting: Student

## 2022-12-05 ENCOUNTER — Ambulatory Visit: Payer: BC Managed Care – PPO | Attending: Student | Admitting: Student

## 2022-12-05 VITALS — BP 120/80 | HR 80 | Ht 62.0 in | Wt 124.0 lb

## 2022-12-05 DIAGNOSIS — J301 Allergic rhinitis due to pollen: Secondary | ICD-10-CM | POA: Diagnosis not present

## 2022-12-05 DIAGNOSIS — J3081 Allergic rhinitis due to animal (cat) (dog) hair and dander: Secondary | ICD-10-CM | POA: Diagnosis not present

## 2022-12-05 DIAGNOSIS — E785 Hyperlipidemia, unspecified: Secondary | ICD-10-CM

## 2022-12-05 DIAGNOSIS — I2510 Atherosclerotic heart disease of native coronary artery without angina pectoris: Secondary | ICD-10-CM

## 2022-12-05 DIAGNOSIS — I5181 Takotsubo syndrome: Secondary | ICD-10-CM

## 2022-12-05 DIAGNOSIS — J3089 Other allergic rhinitis: Secondary | ICD-10-CM | POA: Diagnosis not present

## 2022-12-05 MED ORDER — METOPROLOL SUCCINATE ER 25 MG PO TB24: 12.5000 mg | ORAL_TABLET | Freq: Every day | ORAL | 3 refills | Status: DC

## 2022-12-05 NOTE — Patient Instructions (Signed)
Medication Instructions:  Your physician has recommended you make the following change in your medication:  START: metoprolol succinate (TOPROL XL) 25 MG 24 hr tablet - Take 0.5 tablets (12.5 mg total) by mouth daily at bedtime  *If you need a refill on your cardiac medications before your next appointment, please call your pharmacy*  Lab Work: - None ordered  Testing/Procedures: - None ordered  Follow-Up: At [facility], you and your health needs are our priority.  As part of our continuing mission to provide you with exceptional heart care, we have created designated Provider Care Teams.  These Care Teams include your primary Cardiologist (physician) and Advanced Practice Providers (APPs -  Physician Assistants and Nurse Practitioners) who all work together to provide you with the care you need, when you need it.  Your next appointment:   3 month(s)  Provider:   Carlos Levering, NP    Other Instructions - None

## 2022-12-11 ENCOUNTER — Ambulatory Visit: Payer: BC Managed Care – PPO | Admitting: Internal Medicine

## 2022-12-11 VITALS — BP 118/70 | HR 85 | Temp 98.0°F | Resp 16 | Ht 62.0 in | Wt 123.6 lb

## 2022-12-11 DIAGNOSIS — J301 Allergic rhinitis due to pollen: Secondary | ICD-10-CM | POA: Diagnosis not present

## 2022-12-11 DIAGNOSIS — E0590 Thyrotoxicosis, unspecified without thyrotoxic crisis or storm: Secondary | ICD-10-CM

## 2022-12-11 DIAGNOSIS — J3089 Other allergic rhinitis: Secondary | ICD-10-CM | POA: Diagnosis not present

## 2022-12-11 DIAGNOSIS — K219 Gastro-esophageal reflux disease without esophagitis: Secondary | ICD-10-CM | POA: Diagnosis not present

## 2022-12-11 DIAGNOSIS — I5181 Takotsubo syndrome: Secondary | ICD-10-CM | POA: Diagnosis not present

## 2022-12-11 DIAGNOSIS — R1030 Lower abdominal pain, unspecified: Secondary | ICD-10-CM | POA: Diagnosis not present

## 2022-12-11 DIAGNOSIS — I2510 Atherosclerotic heart disease of native coronary artery without angina pectoris: Secondary | ICD-10-CM

## 2022-12-11 DIAGNOSIS — J3081 Allergic rhinitis due to animal (cat) (dog) hair and dander: Secondary | ICD-10-CM | POA: Diagnosis not present

## 2022-12-11 DIAGNOSIS — F419 Anxiety disorder, unspecified: Secondary | ICD-10-CM

## 2022-12-11 NOTE — Progress Notes (Signed)
Subjective:    Patient ID: Shannon [name], female    DOB: 11-25-1965, 57 y.o.   MRN: [id number]  Patient here for  Chief Complaint  Patient presents with   Medical Management of Chronic Issues    HPI Here for a scheduled follow up.  Recent hospitalization. LHC 11/25/2022 showed mild to moderate nonobstructive CAD and wall motion abnormalities consistent with Takotsubo. Since her discharge she has done well from a cardiac standpoint. No chest pain or tightness. Had f/u with cardiology 12/05/22. Continues on aspirin. Taking jardiance.  Has not started atorvastatin. Plans to start. Instructed to take toprol 12.5mg  in the evening. ECHO - EF 45-50%, GRADE ! DD.  Breathing stable.  Does report increased lower abdominal pain. Just started over the last couple of days.  Was questioning diverticular flare. Took dulcolax and stool softener.  Was having some bm's - frequent but very small amounts. No blood.  Pain is better today.  Eating bland foods. No vomiting.    Past Medical History:  Diagnosis Date   Anemia    Anxiety    Breast pain, right 11/07/2012   Breathing difficulty 06/20/2014   Environmental allergies    GERD (gastroesophageal reflux disease)    Hiatal hernia    small   History of migraine headaches    Hyperthyroidism    s/p ablation   Vaginitis 01/16/2013   Past Surgical History:  Procedure Laterality Date   BREAST BIOPSY Right 12/05/2015   neg   CESAREAN SECTION  11/98   Dr Colletta [place]   CHOLECYSTECTOMY  11/05   Dr Michela Pitcher   JOINT REPLACEMENT Left 2017   hip   LEFT HEART CATH AND CORONARY ANGIOGRAPHY N/A 11/25/2022   Procedure: LEFT HEART CATH AND CORONARY ANGIOGRAPHY;  Surgeon: Yvonne Kendall, MD;  Location: [facility] INVASIVE CV LAB;  Service: Cardiovascular;  Laterality: N/A;   NASAL SINUS SURGERY  11/93   SEPTOPLASTY  5/90   Dr Esmeralda Links   TUBAL LIGATION  02/1998   UMBILICAL HERNIA REPAIR  02/1998   Family History  Problem Relation Age of Onset   Heart disease Father     Hypertension Father    Thyroid disease Father    Hypercholesterolemia Father    Kidney cancer Father    Leukemia Father        hairy cell   Hypercholesterolemia Mother    Thyroid disease Mother    Breast cancer Mother 56   Breast cancer Other        maternal great grandmother   Heart disease Maternal Grandfather        MI - age 61   Prostate cancer Paternal Grandfather    Pancreatic cancer Paternal Grandfather    Lung cancer Paternal Grandfather    Bladder Cancer Neg Hx    Social History   Socioeconomic History   Marital status: Married    Spouse name: Not on file   Number of children: 1   Years of education: Not on file   Highest education level: Not on file  Occupational History   Not on file  Tobacco Use   Smoking status: Former    Current packs/day: 0.00    Types: Cigarettes    Quit date: 01/28/2000    Years since quitting: 22.8   Smokeless tobacco: Never  Substance and Sexual Activity   Alcohol use: No    Alcohol/week: 0.0 standard drinks of alcohol   Drug use: No   Sexual activity: Not Currently    Partners: Male  Other Topics Concern   Not on file  Social History Narrative   Not on file   Social Determinants of Health   Financial Resource Strain: Not on file  Food Insecurity: No Food Insecurity (11/27/2022)   Hunger Vital Sign    Worried About Running Out of Food in the Last Year: Never true    Ran Out of Food in the Last Year: Never true  Transportation Needs: No Transportation Needs (11/27/2022)   PRAPARE - Administrator, Civil Service (Medical): No    Lack of Transportation (Non-Medical): No  Physical Activity: Not on file  Stress: Not on file  Social Connections: Not on file     Review of Systems  Constitutional:  Negative for fever and unexpected weight change.  HENT:  Negative for congestion and sinus pressure.   Respiratory:  Negative for cough, chest tightness and shortness of breath.   Cardiovascular:  Negative for chest  pain, palpitations and leg swelling.  Gastrointestinal:  Positive for abdominal pain. Negative for vomiting.  Genitourinary:  Negative for difficulty urinating and dysuria.  Musculoskeletal:  Negative for joint swelling and myalgias.  Skin:  Negative for color change and rash.  Neurological:  Negative for dizziness and headaches.  Psychiatric/Behavioral:  Negative for agitation and dysphoric mood.        Objective:     BP 118/70   Pulse 85   Temp 98 F (36.7 C)   Resp 16   Ht 5\' 2"  (1.575 m)   Wt 123 lb 9.6 oz (56.1 kg)   LMP 02/12/2013   SpO2 98%   BMI 22.61 kg/m  Wt Readings from Last 3 Encounters:  12/11/22 123 lb 9.6 oz (56.1 kg)  12/05/22 124 lb (56.2 kg)  11/25/22 116 lb 13.5 oz (53 kg)    Physical Exam Vitals reviewed.  Constitutional:      General: She is not in acute distress.    Appearance: Normal appearance.  HENT:     Head: Normocephalic and atraumatic.     Right Ear: External ear normal.     Left Ear: External ear normal.  Eyes:     General: No scleral icterus.       Right eye: No discharge.        Left eye: No discharge.     Conjunctiva/sclera: Conjunctivae normal.  Neck:     Thyroid: No thyromegaly.  Cardiovascular:     Rate and Rhythm: Normal rate and regular rhythm.  Pulmonary:     Effort: No respiratory distress.     Breath sounds: Normal breath sounds. No wheezing.  Abdominal:     General: Bowel sounds are normal.     Palpations: Abdomen is soft.     Tenderness: There is no abdominal tenderness.     Comments: Minimal tenderness to palpation - lower abdomen - center to left.   Musculoskeletal:        General: No swelling or tenderness.     Cervical back: Neck supple. No tenderness.  Lymphadenopathy:     Cervical: No cervical adenopathy.  Skin:    Findings: No erythema or rash.  Neurological:     Mental Status: She is alert.  Psychiatric:        Mood and Affect: Mood normal.        Behavior: Behavior normal.      Outpatient  Encounter Medications as of 12/11/2022  Medication Sig   acetaminophen (TYLENOL) 500 MG tablet Take 1,000 mg by mouth every 6 (  six) hours as needed for moderate pain (pain score 4-6) or mild pain (pain score 1-3).   ALPRAZolam (XANAX) 0.25 MG tablet TAKE ONE TABLET DAILY IF NEEDED FOR ANXIETY (Patient taking differently: Take 0.125 mg by mouth daily as needed for anxiety.)   aspirin EC 81 MG tablet Take 1 tablet (81 mg total) by mouth daily. Swallow whole.   atorvastatin (LIPITOR) 20 MG tablet Take 1 tablet (20 mg total) by mouth daily. (Patient not taking: Reported on 12/05/2022)   cholecalciferol (VITAMIN D3) 25 MCG (1000 UNIT) tablet Take 1,000 Units by mouth daily.   Cyanocobalamin 1000 MCG/ML KIT Inject 1,000 mcg as directed every 30 (thirty) days.   empagliflozin (JARDIANCE) 10 MG TABS tablet Take 1 tablet (10 mg total) by mouth daily.   EPINEPHrine 0.3 mg/0.3 mL IJ SOAJ injection Inject 0.3 mg into the muscle as needed for anaphylaxis.   esomeprazole (GNP ESOMEPRAZOLE MAGNESIUM) 20 MG capsule Take 40 mg by mouth in the morning.   metoprolol succinate (TOPROL XL) 25 MG 24 hr tablet Take 0.5 tablets (12.5 mg total) by mouth daily. At bedtime   nitroGLYCERIN (NITROSTAT) 0.4 MG SL tablet Place 1 tablet (0.4 mg total) under the tongue every 5 (five) minutes as needed for chest pain.   ondansetron (ZOFRAN-ODT) 4 MG disintegrating tablet Take 1 tablet (4 mg total) by mouth every 6 (six) hours as needed for nausea or vomiting.   SUMAtriptan (IMITREX) 100 MG tablet Take 100 mg by mouth daily as needed for migraine.   No facility-administered encounter medications on file as of 12/11/2022.     Lab Results  Component Value Date   WBC 12.9 (H) 12/11/2022   HGB 13.6 12/11/2022   HCT 41.3 12/11/2022   PLT 288.0 12/11/2022   GLUCOSE 94 12/11/2022   CHOL 201 (H) 11/25/2022   TRIG 133 11/25/2022   HDL 73 11/25/2022   LDLCALC 101 (H) 11/25/2022   ALT 12 12/11/2022   AST 15 12/11/2022   NA 135  12/11/2022   K 3.8 12/11/2022   CL 100 12/11/2022   CREATININE 0.74 12/11/2022   BUN 10 12/11/2022   CO2 27 12/11/2022   TSH 1.031 11/24/2022   INR 1.0 11/24/2022    CARDIAC CATHETERIZATION  Result Date: 11/25/2022 Conclusions: Mild to moderate, nonobstructive coronary artery disease with 30-40% mid LAD stenosis.  No significant disease noted in the LMCA, LCx, or RCA. Mildly reduced left ventricular systolic function with mid left ventricular wall motion abnormality most consistent with a Takotsubo variant. Normal left ventricular filling pressure.  Recommendations: Escalate goal-directed medical therapy for nonischemic cardiomyopathy, likely Takotsubo variant. Continue medical therapy and risk factor modification to prevent progression of mild-moderate CAD. Yvonne Kendall, MD [facility]  ECHOCARDIOGRAM COMPLETE  Result Date: 11/25/2022    ECHOCARDIOGRAM REPORT   Patient Name:   Shannon [name] Date of Exam: 11/25/2022 Medical Rec #:  [id number]      Height:       62.0 in Accession #:    [id number]     Weight:       116.8 lb Date of Birth:  03-27-1965      BSA:          1.521 m Patient Age:    57 years       BP:           101/67 mmHg Patient Gender: F              HR:  73 bpm. Exam Location:  [facility] Procedure: 2D Echo, 3D Echo, Cardiac Doppler, Color Doppler and Strain Analysis Indications:     NSTEMI  History:         Patient has no prior history of Echocardiogram examinations.                  Acute MI; Signs/Symptoms:Chest Pain.  Sonographer:     Mikki Harbor Referring Phys:  [id number] AMY N COX Diagnosing Phys: Yvonne Kendall MD  Sonographer Comments: Global longitudinal strain was attempted. IMPRESSIONS  1. Left ventricular ejection fraction, by estimation, is 45 to 50%. The left ventricle has mildly decreased function. The left ventricle demonstrates regional wall motion abnormalities (see scoring diagram/findings for description). There is mild left ventricular hypertrophy. Left  ventricular diastolic parameters are consistent with Grade I diastolic dysfunction (impaired relaxation). There is severe hypokinesis of the left ventricular, mid-apical inferolateral wall. The average left ventricular global longitudinal strain is -12.5 %. The global longitudinal strain is abnormal.  2. Right ventricular systolic function is normal. The right ventricular size is normal.  3. The mitral valve is normal in structure. No evidence of mitral valve regurgitation. No evidence of mitral stenosis.  4. The aortic valve is tricuspid. Aortic valve regurgitation is not visualized. No aortic stenosis is present.  5. The inferior vena cava is normal in size with greater than 50% respiratory variability, suggesting right atrial pressure of 3 mmHg. FINDINGS  Left Ventricle: Left ventricular ejection fraction, by estimation, is 45 to 50%. The left ventricle has mildly decreased function. The left ventricle demonstrates regional wall motion abnormalities. Severe hypokinesis of the left ventricular, mid-apical  inferolateral wall. The average left ventricular global longitudinal strain is -12.5 %. The global longitudinal strain is abnormal. The left ventricular internal cavity size was normal in size. There is mild left ventricular hypertrophy. Left ventricular diastolic parameters are consistent with Grade I diastolic dysfunction (impaired relaxation). Right Ventricle: The right ventricular size is normal. No increase in right ventricular wall thickness. Right ventricular systolic function is normal. Left Atrium: Left atrial size was normal in size. Right Atrium: Right atrial size was normal in size. Pericardium: There is no evidence of pericardial effusion. Mitral Valve: The mitral valve is normal in structure. No evidence of mitral valve regurgitation. No evidence of mitral valve stenosis. MV peak gradient, 3.5 mmHg. The mean mitral valve gradient is 1.0 mmHg. Tricuspid Valve: The tricuspid valve is normal in  structure. Tricuspid valve regurgitation is not demonstrated. Aortic Valve: The aortic valve is tricuspid. Aortic valve regurgitation is not visualized. No aortic stenosis is present. Aortic valve mean gradient measures 2.0 mmHg. Aortic valve peak gradient measures 4.0 mmHg. Aortic valve area, by VTI measures 2.26 cm. Pulmonic Valve: The pulmonic valve was normal in structure. Pulmonic valve regurgitation is not visualized. No evidence of pulmonic stenosis. Aorta: The aortic root is normal in size and structure. Pulmonary Artery: The pulmonary artery is of normal size. Venous: The inferior vena cava is normal in size with greater than 50% respiratory variability, suggesting right atrial pressure of 3 mmHg. IAS/Shunts: No atrial level shunt detected by color flow Doppler.  LEFT VENTRICLE PLAX 2D LVIDd:         3.70 cm     Diastology LVIDs:         2.90 cm     LV e' medial:    5.33 cm/s LV PW:         1.10 cm     LV E/e'  medial:  8.2 LV IVS:        1.10 cm     LV e' lateral:   5.98 cm/s LVOT diam:     2.00 cm     LV E/e' lateral: 7.3 LV SV:         44 LV SV Index:   29          2D Longitudinal Strain LVOT Area:     3.14 cm    2D Strain GLS Avg:     -12.5 %  LV Volumes (MOD) LV vol d, MOD A2C: 39.5 ml LV vol d, MOD A4C: 54.9 ml LV vol s, MOD A2C: 20.9 ml LV vol s, MOD A4C: 28.8 ml LV SV MOD A2C:     18.6 ml LV SV MOD A4C:     54.9 ml LV SV MOD BP:      24.3 ml RIGHT VENTRICLE RV Basal diam:  2.45 cm RV Mid diam:    2.10 cm RV S prime:     11.20 cm/s LEFT ATRIUM           Index        RIGHT ATRIUM          Index LA diam:      2.80 cm 1.84 cm/m   RA Area:     8.53 cm LA Vol (A2C): 24.4 ml 16.04 ml/m  RA Volume:   16.30 ml 10.71 ml/m LA Vol (A4C): 19.3 ml 12.69 ml/m  AORTIC VALVE                    PULMONIC VALVE AV Area (Vmax):    2.45 cm     PV Vmax:       0.83 m/s AV Area (Vmean):   2.28 cm     PV Peak grad:  2.8 mmHg AV Area (VTI):     2.26 cm AV Vmax:           99.40 cm/s AV Vmean:          65.800 cm/s AV  VTI:            0.195 m AV Peak Grad:      4.0 mmHg AV Mean Grad:      2.0 mmHg LVOT Vmax:         77.40 cm/s LVOT Vmean:        47.700 cm/s LVOT VTI:          0.140 m LVOT/AV VTI ratio: 0.72  AORTA Ao Root diam: 3.20 cm MITRAL VALVE MV Area (PHT): 2.32 cm    SHUNTS MV Area VTI:   2.01 cm    Systemic VTI:  0.14 m MV Peak grad:  3.5 mmHg    Systemic Diam: 2.00 cm MV Mean grad:  1.0 mmHg MV Vmax:       0.94 m/s MV Vmean:      42.1 cm/s MV Decel Time: 327 msec MV E velocity: 43.50 cm/s MV A velocity: 87.50 cm/s MV E/A ratio:  0.50 Cristal Deer End MD Electronically signed by Yvonne Kendall MD Signature Date/Time: 11/25/2022/2:35:02 PM    Final        Assessment & Plan:  Lower abdominal pain Assessment & Plan: Lower abdominal pain as outlined.  Concerned regarding diverticular flare.  Discussed the need to keep bowels moving.  Suppository/enema as directed - given has been days since good bm.  Once bowel movement, start benefiber daily.  Pain has improved.  Check cbc and  metabolic panel.  Call with update.  Hold on scanning/abx since improvement. Follow.   Orders: -     CBC with Differential/Platelet -     Basic metabolic panel -     Hepatic function panel  Stress-induced cardiomyopathy Assessment & Plan: Continues on aspirin. Taking jardiance.  Has not started atorvastatin. Plans to start. Instructed to take toprol 12.5mg  in the evening. ECHO - EF 45-50%, GRADE ! DD.  Breathing stable.     Hyperthyroidism Assessment & Plan: S/p ablation.  Follow tsh.    Gastroesophageal reflux disease, unspecified whether esophagitis present Assessment & Plan: Continue nexium.  No increased acid reflux.    Coronary artery disease involving native coronary artery of native heart without angina pectoris Assessment & Plan: Cardiac cath 11/25/22 - Mild to moderate, nonobstructive coronary artery disease with 30-40% mid LAD stenosis.  No significant disease noted in the LMCA, LCx, or RCA.. Mildly reduced  left ventricular systolic function with mid left ventricular wall motion abnormality most consistent with a Takotsubo variant. Normal left ventricular filling pressure.Continues on aspirin. Taking jardiance.  Has not started atorvastatin. Plans to start. Instructed to take toprol 12.5mg  in the evening. ECHO - EF 45-50%, GRADE ! DD.  No chest pain. Breathing stable.  Planning to start cardiac rehab.    Anxiety Assessment & Plan: Overall appears to be handling things relatively well.  Follow.       Dale Bellflower, MD

## 2022-12-12 ENCOUNTER — Encounter: Payer: Self-pay | Admitting: Internal Medicine

## 2022-12-12 LAB — CBC WITH DIFFERENTIAL/PLATELET
Basophils Absolute: 0.1 10*3/uL (ref 0.0–0.1)
Basophils Relative: 0.6 % (ref 0.0–3.0)
Eosinophils Absolute: 0 10*3/uL (ref 0.0–0.7)
Eosinophils Relative: 0.2 % (ref 0.0–5.0)
HCT: 41.3 % (ref 36.0–46.0)
Hemoglobin: 13.6 g/dL (ref 12.0–15.0)
Lymphocytes Relative: 11.4 % — ABNORMAL LOW (ref 12.0–46.0)
Lymphs Abs: 1.5 10*3/uL (ref 0.7–4.0)
MCHC: 32.8 g/dL (ref 30.0–36.0)
MCV: 93.1 fL (ref 78.0–100.0)
Monocytes Absolute: 1.1 10*3/uL — ABNORMAL HIGH (ref 0.1–1.0)
Monocytes Relative: 8.6 % (ref 3.0–12.0)
Neutro Abs: 10.2 10*3/uL — ABNORMAL HIGH (ref 1.4–7.7)
Neutrophils Relative %: 79.2 % — ABNORMAL HIGH (ref 43.0–77.0)
Platelets: 288 10*3/uL (ref 150.0–400.0)
RBC: 4.44 Mil/uL (ref 3.87–5.11)
RDW: 12.8 % (ref 11.5–15.5)
WBC: 12.9 10*3/uL — ABNORMAL HIGH (ref 4.0–10.5)

## 2022-12-12 LAB — BASIC METABOLIC PANEL
BUN: 10 mg/dL (ref 6–23)
CO2: 27 meq/L (ref 19–32)
Calcium: 9.4 mg/dL (ref 8.4–10.5)
Chloride: 100 meq/L (ref 96–112)
Creatinine, Ser: 0.74 mg/dL (ref 0.40–1.20)
GFR: 88.67 mL/min (ref >60.00)
Glucose, Bld: 94 mg/dL (ref 70–99)
Potassium: 3.8 meq/L (ref 3.5–5.1)
Sodium: 135 meq/L (ref 135–145)

## 2022-12-12 LAB — HEPATIC FUNCTION PANEL
ALT: 12 U/L (ref 0–35)
AST: 15 U/L (ref 0–37)
Albumin: 4.2 g/dL (ref 3.5–5.2)
Alkaline Phosphatase: 68 U/L (ref 39–117)
Bilirubin, Direct: 0.1 mg/dL (ref 0.0–0.3)
Total Bilirubin: 0.8 mg/dL (ref 0.2–1.2)
Total Protein: 7 g/dL (ref 6.0–8.3)

## 2022-12-12 NOTE — Telephone Encounter (Signed)
See lab result note for notification of labs.  I would prefer her to hold off with injections until can get the issues with her stomach sorted through.

## 2022-12-12 NOTE — Telephone Encounter (Signed)
Pt.notified

## 2022-12-14 ENCOUNTER — Encounter: Payer: Self-pay | Admitting: Internal Medicine

## 2022-12-14 DIAGNOSIS — R1030 Lower abdominal pain, unspecified: Secondary | ICD-10-CM | POA: Insufficient documentation

## 2022-12-14 NOTE — Assessment & Plan Note (Signed)
Continue nexium.  No increased acid reflux.

## 2022-12-14 NOTE — Assessment & Plan Note (Signed)
Overall appears to be handling things relatively well.  Follow.   

## 2022-12-14 NOTE — Assessment & Plan Note (Signed)
Continues on aspirin. Taking jardiance.  Has not started atorvastatin. Plans to start. Instructed to take toprol 12.5mg  in the evening. ECHO - EF 45-50%, GRADE ! DD.  Breathing stable.

## 2022-12-14 NOTE — Assessment & Plan Note (Signed)
Lower abdominal pain as outlined.  Concerned regarding diverticular flare.  Discussed the need to keep bowels moving.  Suppository/enema as directed - given has been days since good bm.  Once bowel movement, start benefiber daily.  Pain has improved.  Check cbc and metabolic panel.  Call with update.  Hold on scanning/abx since improvement. Follow.

## 2022-12-14 NOTE — Assessment & Plan Note (Signed)
S/p ablation.  Follow tsh.  

## 2022-12-14 NOTE — Assessment & Plan Note (Signed)
Cardiac cath 11/25/22 - Mild to moderate, nonobstructive coronary artery disease with 30-40% mid LAD stenosis.  No significant disease noted in the LMCA, LCx, or RCA.. Mildly reduced left ventricular systolic function with mid left ventricular wall motion abnormality most consistent with a Takotsubo variant. Normal left ventricular filling pressure.Continues on aspirin. Taking jardiance.  Has not started atorvastatin. Plans to start. Instructed to take toprol 12.5mg  in the evening. ECHO - EF 45-50%, GRADE ! DD.  No chest pain. Breathing stable.  Planning to start cardiac rehab.

## 2022-12-16 ENCOUNTER — Ambulatory Visit (INDEPENDENT_AMBULATORY_CARE_PROVIDER_SITE_OTHER): Payer: BC Managed Care – PPO

## 2022-12-16 DIAGNOSIS — E538 Deficiency of other specified B group vitamins: Secondary | ICD-10-CM

## 2022-12-16 DIAGNOSIS — J3089 Other allergic rhinitis: Secondary | ICD-10-CM | POA: Diagnosis not present

## 2022-12-16 DIAGNOSIS — J301 Allergic rhinitis due to pollen: Secondary | ICD-10-CM | POA: Diagnosis not present

## 2022-12-16 DIAGNOSIS — J3081 Allergic rhinitis due to animal (cat) (dog) hair and dander: Secondary | ICD-10-CM | POA: Diagnosis not present

## 2022-12-16 MED ORDER — CYANOCOBALAMIN 1000 MCG/ML IJ SOLN
1000.0000 ug | Freq: Once | INTRAMUSCULAR | Status: AC
  Administered 2022-12-16: 1000 ug via INTRAMUSCULAR

## 2022-12-16 NOTE — Progress Notes (Signed)
Patient presented for B 12 injection to right deltoid, patient voiced no concerns nor showed any signs of distress during injection. 

## 2022-12-23 DIAGNOSIS — J3081 Allergic rhinitis due to animal (cat) (dog) hair and dander: Secondary | ICD-10-CM | POA: Diagnosis not present

## 2022-12-23 DIAGNOSIS — J3089 Other allergic rhinitis: Secondary | ICD-10-CM | POA: Diagnosis not present

## 2022-12-23 DIAGNOSIS — J301 Allergic rhinitis due to pollen: Secondary | ICD-10-CM | POA: Diagnosis not present

## 2022-12-29 DIAGNOSIS — G5603 Carpal tunnel syndrome, bilateral upper limbs: Secondary | ICD-10-CM | POA: Diagnosis not present

## 2022-12-30 DIAGNOSIS — J3089 Other allergic rhinitis: Secondary | ICD-10-CM | POA: Diagnosis not present

## 2022-12-30 DIAGNOSIS — J301 Allergic rhinitis due to pollen: Secondary | ICD-10-CM | POA: Diagnosis not present

## 2022-12-30 DIAGNOSIS — J3081 Allergic rhinitis due to animal (cat) (dog) hair and dander: Secondary | ICD-10-CM | POA: Diagnosis not present

## 2023-01-06 DIAGNOSIS — J3081 Allergic rhinitis due to animal (cat) (dog) hair and dander: Secondary | ICD-10-CM | POA: Diagnosis not present

## 2023-01-06 DIAGNOSIS — J3089 Other allergic rhinitis: Secondary | ICD-10-CM | POA: Diagnosis not present

## 2023-01-06 DIAGNOSIS — J301 Allergic rhinitis due to pollen: Secondary | ICD-10-CM | POA: Diagnosis not present

## 2023-01-08 ENCOUNTER — Ambulatory Visit (INDEPENDENT_AMBULATORY_CARE_PROVIDER_SITE_OTHER): Payer: BC Managed Care – PPO | Admitting: Internal Medicine

## 2023-01-08 VITALS — BP 126/72 | HR 57 | Temp 98.1°F | Ht 62.0 in | Wt 123.2 lb

## 2023-01-08 DIAGNOSIS — E041 Nontoxic single thyroid nodule: Secondary | ICD-10-CM

## 2023-01-08 DIAGNOSIS — E0590 Thyrotoxicosis, unspecified without thyrotoxic crisis or storm: Secondary | ICD-10-CM

## 2023-01-08 DIAGNOSIS — I5181 Takotsubo syndrome: Secondary | ICD-10-CM

## 2023-01-08 DIAGNOSIS — Z13220 Encounter for screening for lipoid disorders: Secondary | ICD-10-CM

## 2023-01-08 DIAGNOSIS — K219 Gastro-esophageal reflux disease without esophagitis: Secondary | ICD-10-CM

## 2023-01-08 DIAGNOSIS — E538 Deficiency of other specified B group vitamins: Secondary | ICD-10-CM | POA: Diagnosis not present

## 2023-01-08 DIAGNOSIS — F419 Anxiety disorder, unspecified: Secondary | ICD-10-CM

## 2023-01-08 DIAGNOSIS — I2510 Atherosclerotic heart disease of native coronary artery without angina pectoris: Secondary | ICD-10-CM

## 2023-01-08 MED ORDER — ALPRAZOLAM 0.25 MG PO TABS: ORAL_TABLET | ORAL | 0 refills | Status: DC

## 2023-01-08 MED ORDER — CYANOCOBALAMIN 1000 MCG/ML IJ SOLN
1000.0000 ug | Freq: Once | INTRAMUSCULAR | Status: AC
  Administered 2023-01-08: 1000 ug via INTRAMUSCULAR

## 2023-01-08 NOTE — Progress Notes (Signed)
Subjective:    Patient ID: Shannon [name], female    DOB: [birth year], 57 y.o.   MRN: [id number]  Patient here for  Chief Complaint  Patient presents with   Medical Management of Chronic Issues    4 wk f/u    HPI Here for a scheduled follow up.  Recent hospitalization. LHC 11/25/2022 showed mild to moderate nonobstructive CAD and wall motion abnormalities consistent with Takotsubo. Since her discharge she has done well from a cardiac standpoint. No chest pain or tightness with increased activity or exertion. Had f/u with cardiology 12/05/22. Continues on aspirin. Taking jardiance. ECHO - EF 45-50%, GRADE 1 DD. Saw ortho 12/29/22 - s/p bilateral CSI - CTS. Hands are doing some better. Stays active. Had questions about incidental finding - thyroid nodule.    Past Medical History:  Diagnosis Date   Anemia    Anxiety    Breast pain, right 11/07/2012   Breathing difficulty 06/20/2014   Environmental allergies    GERD (gastroesophageal reflux disease)    Hiatal hernia    small   History of migraine headaches    Hyperthyroidism    s/p ablation   Vaginitis 01/16/2013   Past Surgical History:  Procedure Laterality Date   BREAST BIOPSY Right 12/05/2015   neg   CESAREAN SECTION  11/98   Dr Colletta [place]   CHOLECYSTECTOMY  11/05   Dr Michela Pitcher   JOINT REPLACEMENT Left 2017   hip   LEFT HEART CATH AND CORONARY ANGIOGRAPHY N/A 11/25/2022   Procedure: LEFT HEART CATH AND CORONARY ANGIOGRAPHY;  Surgeon: Yvonne Kendall, MD;  Location: [facility] INVASIVE CV [facility];  Service: Cardiovascular;  Laterality: N/A;   NASAL SINUS SURGERY  11/93   SEPTOPLASTY  5/90   Dr Esmeralda Links   TUBAL LIGATION  02/1998   UMBILICAL HERNIA REPAIR  02/1998   Family History  Problem Relation Age of Onset   Heart disease Father    Hypertension Father    Thyroid disease Father    Hypercholesterolemia Father    Kidney cancer Father    Leukemia Father        hairy cell   Hypercholesterolemia Mother    Thyroid disease Mother     Breast cancer Mother 74   Breast cancer Other        maternal great grandmother   Heart disease Maternal Grandfather        MI - age 25   Prostate cancer Paternal Grandfather    Pancreatic cancer Paternal Grandfather    Lung cancer Paternal Grandfather    Bladder Cancer Neg Hx    Social History   Socioeconomic History   Marital status: Married    Spouse name: Not on file   Number of children: 1   Years of education: Not on file   Highest education level: Not on file  Occupational History   Not on file  Tobacco Use   Smoking status: Former    Current packs/day: 0.00    Types: Cigarettes    Quit date: 01/28/2000    Years since quitting: 22.9   Smokeless tobacco: Never  Substance and Sexual Activity   Alcohol use: No    Alcohol/week: 0.0 standard drinks of alcohol   Drug use: No   Sexual activity: Not Currently    Partners: Male  Other Topics Concern   Not on file  Social History Narrative   Not on file   Social Drivers of Health   Financial Resource Strain: Not on file  Food Insecurity: No Food Insecurity (11/27/2022)   Hunger Vital Sign    Worried About Running Out of Food in the Last Year: Never true    Ran Out of Food in the Last Year: Never true  Transportation Needs: No Transportation Needs (11/27/2022)   PRAPARE - Administrator, Civil Service (Medical): No    Lack of Transportation (Non-Medical): No  Physical Activity: Not on file  Stress: Not on file  Social Connections: Not on file     Review of Systems  Constitutional:  Negative for appetite change and unexpected weight change.  HENT:  Negative for congestion and sinus pressure.   Respiratory:  Negative for cough, chest tightness and shortness of breath.   Cardiovascular:  Negative for chest pain and palpitations.  Gastrointestinal:  Negative for abdominal pain, diarrhea, nausea and vomiting.  Genitourinary:  Negative for difficulty urinating and dysuria.  Musculoskeletal:  Negative for  joint swelling and myalgias.  Skin:  Negative for color change and rash.  Neurological:  Negative for dizziness and headaches.  Psychiatric/Behavioral:  Negative for agitation and dysphoric mood.        Objective:     BP 126/72   Pulse (!) 57   Temp 98.1 F (36.7 C)   Ht 5\' 2"  (1.575 m)   Wt 123 lb 3.2 oz (55.9 kg)   LMP 02/12/2013   SpO2 95%   BMI 22.53 kg/m  Wt Readings from Last 3 Encounters:  01/08/23 123 lb 3.2 oz (55.9 kg)  12/11/22 123 lb 9.6 oz (56.1 kg)  12/05/22 124 lb (56.2 kg)    Physical Exam Vitals reviewed.  Constitutional:      General: She is not in acute distress.    Appearance: Normal appearance.  HENT:     Head: Normocephalic and atraumatic.     Right Ear: External ear normal.     Left Ear: External ear normal.     Mouth/Throat:     Pharynx: No oropharyngeal exudate or posterior oropharyngeal erythema.  Eyes:     General: No scleral icterus.       Right eye: No discharge.        Left eye: No discharge.     Conjunctiva/sclera: Conjunctivae normal.  Neck:     Thyroid: No thyromegaly.  Cardiovascular:     Rate and Rhythm: Normal rate and regular rhythm.  Pulmonary:     Effort: No respiratory distress.     Breath sounds: Normal breath sounds. No wheezing.  Abdominal:     General: Bowel sounds are normal.     Palpations: Abdomen is soft.     Tenderness: There is no abdominal tenderness.  Musculoskeletal:        General: No swelling or tenderness.     Cervical back: Neck supple. No tenderness.  Lymphadenopathy:     Cervical: No cervical adenopathy.  Skin:    Findings: No erythema or rash.  Neurological:     Mental Status: She is alert.  Psychiatric:        Mood and Affect: Mood normal.        Behavior: Behavior normal.      Outpatient Encounter Medications as of 01/08/2023  Medication Sig   atorvastatin (LIPITOR) 20 MG tablet Take 1 tablet (20 mg total) by mouth daily.   acetaminophen (TYLENOL) 500 MG tablet Take 1,000 mg by mouth  every 6 (six) hours as needed for moderate pain (pain score 4-6) or mild pain (pain score 1-3).   ALPRAZolam (  XANAX) 0.25 MG tablet TAKE ONE TABLET DAILY IF NEEDED FOR ANXIETY   aspirin EC 81 MG tablet Take 1 tablet (81 mg total) by mouth daily. Swallow whole.   cholecalciferol (VITAMIN D3) 25 MCG (1000 UNIT) tablet Take 1,000 Units by mouth daily.   Cyanocobalamin 1000 MCG/ML KIT Inject 1,000 mcg as directed every 30 (thirty) days.   empagliflozin (JARDIANCE) 10 MG TABS tablet Take 1 tablet (10 mg total) by mouth daily.   EPINEPHrine 0.3 mg/0.3 mL IJ SOAJ injection Inject 0.3 mg into the muscle as needed for anaphylaxis.   esomeprazole (GNP ESOMEPRAZOLE MAGNESIUM) 20 MG capsule Take 40 mg by mouth in the morning.   metoprolol succinate (TOPROL XL) 25 MG 24 hr tablet Take 0.5 tablets (12.5 mg total) by mouth daily. At bedtime   nitroGLYCERIN (NITROSTAT) 0.4 MG SL tablet Place 1 tablet (0.4 mg total) under the tongue every 5 (five) minutes as needed for chest pain.   ondansetron (ZOFRAN-ODT) 4 MG disintegrating tablet Take 1 tablet (4 mg total) by mouth every 6 (six) hours as needed for nausea or vomiting.   SUMAtriptan (IMITREX) 100 MG tablet Take 100 mg by mouth daily as needed for migraine.   [DISCONTINUED] ALPRAZolam (XANAX) 0.25 MG tablet TAKE ONE TABLET DAILY IF NEEDED FOR ANXIETY (Patient taking differently: Take 0.125 mg by mouth daily as needed for anxiety.)   [EXPIRED] cyanocobalamin (VITAMIN B12) injection 1,000 mcg    No facility-administered encounter medications on file as of 01/08/2023.     Lab Results  Component Value Date   WBC 12.9 (H) 12/11/2022   HGB 13.6 12/11/2022   HCT 41.3 12/11/2022   PLT 288.0 12/11/2022   GLUCOSE 94 12/11/2022   CHOL 201 (H) 11/25/2022   TRIG 133 11/25/2022   HDL 73 11/25/2022   LDLCALC 101 (H) 11/25/2022   ALT 12 12/11/2022   AST 15 12/11/2022   NA 135 12/11/2022   K 3.8 12/11/2022   CL 100 12/11/2022   CREATININE 0.74 12/11/2022   BUN 10  12/11/2022   CO2 27 12/11/2022   TSH 1.031 11/24/2022   INR 1.0 11/24/2022    CARDIAC CATHETERIZATION Result Date: 11/25/2022 Conclusions: Mild to moderate, nonobstructive coronary artery disease with 30-40% mid LAD stenosis.  No significant disease noted in the LMCA, LCx, or RCA. Mildly reduced left ventricular systolic function with mid left ventricular wall motion abnormality most consistent with a Takotsubo variant. Normal left ventricular filling pressure.  Recommendations: Escalate goal-directed medical therapy for nonischemic cardiomyopathy, likely Takotsubo variant. Continue medical therapy and risk factor modification to prevent progression of mild-moderate CAD. Yvonne Kendall, MD [facility]  ECHOCARDIOGRAM COMPLETE Result Date: 11/25/2022    ECHOCARDIOGRAM REPORT   Patient Name:   YOZELIN SEVERO Date of Exam: 11/25/2022 Medical Rec #:  [id number]      Height:       62.0 in Accession #:    [id number]     Weight:       116.8 lb Date of Birth:  [birth year]      BSA:          1.521 m Patient Age:    57 years       BP:           101/67 mmHg Patient Gender: F              HR:           73 bpm. Exam Location:  [facility] Procedure: 2D Echo, 3D Echo, Cardiac Doppler,  Color Doppler and Strain Analysis Indications:     NSTEMI  History:         Patient has no prior history of Echocardiogram examinations.                  Acute MI; Signs/Symptoms:Chest Pain.  Sonographer:     Mikki Harbor Referring Phys:  [id number] AMY N COX Diagnosing Phys: Yvonne Kendall MD  Sonographer Comments: Global longitudinal strain was attempted. IMPRESSIONS  1. Left ventricular ejection fraction, by estimation, is 45 to 50%. The left ventricle has mildly decreased function. The left ventricle demonstrates regional wall motion abnormalities (see scoring diagram/findings for description). There is mild left ventricular hypertrophy. Left ventricular diastolic parameters are consistent with Grade I diastolic dysfunction (impaired  relaxation). There is severe hypokinesis of the left ventricular, mid-apical inferolateral wall. The average left ventricular global longitudinal strain is -12.5 %. The global longitudinal strain is abnormal.  2. Right ventricular systolic function is normal. The right ventricular size is normal.  3. The mitral valve is normal in structure. No evidence of mitral valve regurgitation. No evidence of mitral stenosis.  4. The aortic valve is tricuspid. Aortic valve regurgitation is not visualized. No aortic stenosis is present.  5. The inferior vena cava is normal in size with greater than 50% respiratory variability, suggesting right atrial pressure of 3 mmHg. FINDINGS  Left Ventricle: Left ventricular ejection fraction, by estimation, is 45 to 50%. The left ventricle has mildly decreased function. The left ventricle demonstrates regional wall motion abnormalities. Severe hypokinesis of the left ventricular, mid-apical  inferolateral wall. The average left ventricular global longitudinal strain is -12.5 %. The global longitudinal strain is abnormal. The left ventricular internal cavity size was normal in size. There is mild left ventricular hypertrophy. Left ventricular diastolic parameters are consistent with Grade I diastolic dysfunction (impaired relaxation). Right Ventricle: The right ventricular size is normal. No increase in right ventricular wall thickness. Right ventricular systolic function is normal. Left Atrium: Left atrial size was normal in size. Right Atrium: Right atrial size was normal in size. Pericardium: There is no evidence of pericardial effusion. Mitral Valve: The mitral valve is normal in structure. No evidence of mitral valve regurgitation. No evidence of mitral valve stenosis. MV peak gradient, 3.5 mmHg. The mean mitral valve gradient is 1.0 mmHg. Tricuspid Valve: The tricuspid valve is normal in structure. Tricuspid valve regurgitation is not demonstrated. Aortic Valve: The aortic valve is  tricuspid. Aortic valve regurgitation is not visualized. No aortic stenosis is present. Aortic valve mean gradient measures 2.0 mmHg. Aortic valve peak gradient measures 4.0 mmHg. Aortic valve area, by VTI measures 2.26 cm. Pulmonic Valve: The pulmonic valve was normal in structure. Pulmonic valve regurgitation is not visualized. No evidence of pulmonic stenosis. Aorta: The aortic root is normal in size and structure. Pulmonary Artery: The pulmonary artery is of normal size. Venous: The inferior vena cava is normal in size with greater than 50% respiratory variability, suggesting right atrial pressure of 3 mmHg. IAS/Shunts: No atrial level shunt detected by color flow Doppler.  LEFT VENTRICLE PLAX 2D LVIDd:         3.70 cm     Diastology LVIDs:         2.90 cm     LV e' medial:    5.33 cm/s LV PW:         1.10 cm     LV E/e' medial:  8.2 LV IVS:        1.10  cm     LV e' lateral:   5.98 cm/s LVOT diam:     2.00 cm     LV E/e' lateral: 7.3 LV SV:         44 LV SV Index:   29          2D Longitudinal Strain LVOT Area:     3.14 cm    2D Strain GLS Avg:     -12.5 %  LV Volumes (MOD) LV vol d, MOD A2C: 39.5 ml LV vol d, MOD A4C: 54.9 ml LV vol s, MOD A2C: 20.9 ml LV vol s, MOD A4C: 28.8 ml LV SV MOD A2C:     18.6 ml LV SV MOD A4C:     54.9 ml LV SV MOD BP:      24.3 ml RIGHT VENTRICLE RV Basal diam:  2.45 cm RV Mid diam:    2.10 cm RV S prime:     11.20 cm/s LEFT ATRIUM           Index        RIGHT ATRIUM          Index LA diam:      2.80 cm 1.84 cm/m   RA Area:     8.53 cm LA Vol (A2C): 24.4 ml 16.04 ml/m  RA Volume:   16.30 ml 10.71 ml/m LA Vol (A4C): 19.3 ml 12.69 ml/m  AORTIC VALVE                    PULMONIC VALVE AV Area (Vmax):    2.45 cm     PV Vmax:       0.83 m/s AV Area (Vmean):   2.28 cm     PV Peak grad:  2.8 mmHg AV Area (VTI):     2.26 cm AV Vmax:           99.40 cm/s AV Vmean:          65.800 cm/s AV VTI:            0.195 m AV Peak Grad:      4.0 mmHg AV Mean Grad:      2.0 mmHg LVOT Vmax:          77.40 cm/s LVOT Vmean:        47.700 cm/s LVOT VTI:          0.140 m LVOT/AV VTI ratio: 0.72  AORTA Ao Root diam: 3.20 cm MITRAL VALVE MV Area (PHT): 2.32 cm    SHUNTS MV Area VTI:   2.01 cm    Systemic VTI:  0.14 m MV Peak grad:  3.5 mmHg    Systemic Diam: 2.00 cm MV Mean grad:  1.0 mmHg MV Vmax:       0.94 m/s MV Vmean:      42.1 cm/s MV Decel Time: 327 msec MV E velocity: 43.50 cm/s MV A velocity: 87.50 cm/s MV E/A ratio:  0.50 Cristal Deer End MD Electronically signed by Yvonne Kendall MD Signature Date/Time: 11/25/2022/2:35:02 PM    Final        Assessment & Plan:  Screening cholesterol level -     Lipid panel; Future -     Hepatic function panel; Future  Hyperthyroidism Assessment & Plan: S/p ablation.  Follow tsh.   Orders: -     Basic metabolic panel; Future -     CBC with Differential/Platelet; Future  B12 deficiency -     Cyanocobalamin  Thyroid nodule Assessment &  Plan: Noted on CT - Incidental right thyroid nodule with heterogeneous and enlarged thyroid measuring 2.2 cm. Recommend non-emergent thyroid ultrasound. Schedule for f/u thyroid ultrasound.   Orders: -     CBC with Differential/Platelet; Future -     US THYROID; Future  Stress-induced cardiomyopathy Assessment & Plan: Continues on aspirin. Taking jardiance.  Continues on ttoprol 12.5mg . ECHO - EF 45-50%, GRADE ! DD.  Breathing stable.     Gastroesophageal reflux disease, unspecified whether esophagitis present Assessment & Plan: Continue nexium.  No increased acid reflux.    Coronary artery disease involving native coronary artery of native heart without angina pectoris Assessment & Plan: Cardiac cath 11/25/22 - Mild to moderate, nonobstructive coronary artery disease with 30-40% mid LAD stenosis.  No significant disease noted in the LMCA, LCx, or RCA.. Mildly reduced left ventricular systolic function with mid left ventricular wall motion abnormality most consistent with a Takotsubo variant. Normal  left ventricular filling pressure.Continues on aspirin. Taking jardiance.  Has not started atorvastatin. Plans to start. Instructed to take toprol 12.5mg  in the evening. ECHO - EF 45-50%, GRADE ! DD.  No chest pain. Breathing stable. Cardiac rehab.    Anxiety Assessment & Plan: Overall appears to be handling things relatively well.  Follow. Rarely uses xanax. Request refill to have if needed.    Other orders -     ALPRAZolam; TAKE ONE TABLET DAILY IF NEEDED FOR ANXIETY  Dispense: 20 tablet; Refill: 0     Dale Deville, MD

## 2023-01-08 NOTE — Progress Notes (Signed)
Pt presented for their vitamin B12 injection. Pt was identified through two identifiers. Pt tolerated shot well in their right deltoid.  

## 2023-01-11 ENCOUNTER — Encounter: Payer: Self-pay | Admitting: Internal Medicine

## 2023-01-11 NOTE — Assessment & Plan Note (Addendum)
Overall appears to be handling things relatively well.  Follow. Rarely uses xanax. Request refill to have if needed.

## 2023-01-11 NOTE — Assessment & Plan Note (Signed)
Cardiac cath 11/25/22 - Mild to moderate, nonobstructive coronary artery disease with 30-40% mid LAD stenosis.  No significant disease noted in the LMCA, LCx, or RCA.. Mildly reduced left ventricular systolic function with mid left ventricular wall motion abnormality most consistent with a Takotsubo variant. Normal left ventricular filling pressure.Continues on aspirin. Taking jardiance.  Has not started atorvastatin. Plans to start. Instructed to take toprol 12.5mg  in the evening. ECHO - EF 45-50%, GRADE ! DD.  No chest pain. Breathing stable. Cardiac rehab.

## 2023-01-11 NOTE — Assessment & Plan Note (Signed)
Continue nexium.  No increased acid reflux.

## 2023-01-11 NOTE — Assessment & Plan Note (Signed)
S/p ablation.  Follow tsh.  

## 2023-01-11 NOTE — Assessment & Plan Note (Signed)
Noted on CT - Incidental right thyroid nodule with heterogeneous and enlarged thyroid measuring 2.2 cm. Recommend non-emergent thyroid ultrasound. Schedule for f/u thyroid ultrasound.

## 2023-01-11 NOTE — Assessment & Plan Note (Signed)
Continues on aspirin. Taking jardiance.  Continues on ttoprol 12.5mg . ECHO - EF 45-50%, GRADE ! DD.  Breathing stable.

## 2023-01-13 DIAGNOSIS — J301 Allergic rhinitis due to pollen: Secondary | ICD-10-CM | POA: Diagnosis not present

## 2023-01-13 DIAGNOSIS — J3089 Other allergic rhinitis: Secondary | ICD-10-CM | POA: Diagnosis not present

## 2023-01-22 ENCOUNTER — Ambulatory Visit
Admission: RE | Admit: 2023-01-22 | Discharge: 2023-01-22 | Disposition: A | Discharge status: Home / Self Care | Payer: BC Managed Care – PPO | Source: Ambulatory Visit | Attending: Internal Medicine | Admitting: Internal Medicine

## 2023-01-22 DIAGNOSIS — E041 Nontoxic single thyroid nodule: Secondary | ICD-10-CM | POA: Insufficient documentation

## 2023-01-22 DIAGNOSIS — J3081 Allergic rhinitis due to animal (cat) (dog) hair and dander: Secondary | ICD-10-CM | POA: Diagnosis not present

## 2023-01-22 DIAGNOSIS — J301 Allergic rhinitis due to pollen: Secondary | ICD-10-CM | POA: Diagnosis not present

## 2023-01-22 DIAGNOSIS — J3089 Other allergic rhinitis: Secondary | ICD-10-CM | POA: Diagnosis not present

## 2023-01-29 DIAGNOSIS — J3089 Other allergic rhinitis: Secondary | ICD-10-CM | POA: Diagnosis not present

## 2023-01-29 DIAGNOSIS — J3081 Allergic rhinitis due to animal (cat) (dog) hair and dander: Secondary | ICD-10-CM | POA: Diagnosis not present

## 2023-01-29 DIAGNOSIS — J301 Allergic rhinitis due to pollen: Secondary | ICD-10-CM | POA: Diagnosis not present

## 2023-01-30 ENCOUNTER — Other Ambulatory Visit: Payer: Self-pay | Admitting: Internal Medicine

## 2023-01-30 DIAGNOSIS — E041 Nontoxic single thyroid nodule: Secondary | ICD-10-CM

## 2023-01-30 NOTE — Progress Notes (Signed)
Order placed for referral to endocrinology.  

## 2023-02-02 DIAGNOSIS — N898 Other specified noninflammatory disorders of vagina: Secondary | ICD-10-CM | POA: Diagnosis not present

## 2023-02-03 DIAGNOSIS — G43019 Migraine without aura, intractable, without status migrainosus: Secondary | ICD-10-CM | POA: Diagnosis not present

## 2023-02-03 DIAGNOSIS — J3089 Other allergic rhinitis: Secondary | ICD-10-CM | POA: Diagnosis not present

## 2023-02-03 DIAGNOSIS — J301 Allergic rhinitis due to pollen: Secondary | ICD-10-CM | POA: Diagnosis not present

## 2023-02-05 ENCOUNTER — Ambulatory Visit (INDEPENDENT_AMBULATORY_CARE_PROVIDER_SITE_OTHER): Payer: BC Managed Care – PPO

## 2023-02-05 DIAGNOSIS — E538 Deficiency of other specified B group vitamins: Secondary | ICD-10-CM | POA: Diagnosis not present

## 2023-02-05 MED ORDER — CYANOCOBALAMIN 1000 MCG/ML IJ SOLN
1000.0000 ug | Freq: Once | INTRAMUSCULAR | Status: AC
  Administered 2023-02-05: 1000 ug via INTRAMUSCULAR

## 2023-02-05 NOTE — Progress Notes (Signed)
 Pt presented for their vitamin B12 injection. Pt was identified through two identifiers. Pt tolerated shot well in their left deltoid.

## 2023-02-09 DIAGNOSIS — D225 Melanocytic nevi of trunk: Secondary | ICD-10-CM | POA: Diagnosis not present

## 2023-02-09 DIAGNOSIS — D2262 Melanocytic nevi of left upper limb, including shoulder: Secondary | ICD-10-CM | POA: Diagnosis not present

## 2023-02-09 DIAGNOSIS — D2272 Melanocytic nevi of left lower limb, including hip: Secondary | ICD-10-CM | POA: Diagnosis not present

## 2023-02-09 DIAGNOSIS — D2261 Melanocytic nevi of right upper limb, including shoulder: Secondary | ICD-10-CM | POA: Diagnosis not present

## 2023-02-10 DIAGNOSIS — J301 Allergic rhinitis due to pollen: Secondary | ICD-10-CM | POA: Diagnosis not present

## 2023-02-10 DIAGNOSIS — J3089 Other allergic rhinitis: Secondary | ICD-10-CM | POA: Diagnosis not present

## 2023-02-10 DIAGNOSIS — J3081 Allergic rhinitis due to animal (cat) (dog) hair and dander: Secondary | ICD-10-CM | POA: Diagnosis not present

## 2023-02-16 DIAGNOSIS — J301 Allergic rhinitis due to pollen: Secondary | ICD-10-CM | POA: Diagnosis not present

## 2023-02-16 DIAGNOSIS — J3089 Other allergic rhinitis: Secondary | ICD-10-CM | POA: Diagnosis not present

## 2023-02-17 DIAGNOSIS — J3081 Allergic rhinitis due to animal (cat) (dog) hair and dander: Secondary | ICD-10-CM | POA: Diagnosis not present

## 2023-02-17 DIAGNOSIS — J301 Allergic rhinitis due to pollen: Secondary | ICD-10-CM | POA: Diagnosis not present

## 2023-02-17 DIAGNOSIS — J3089 Other allergic rhinitis: Secondary | ICD-10-CM | POA: Diagnosis not present

## 2023-02-18 DIAGNOSIS — E042 Nontoxic multinodular goiter: Secondary | ICD-10-CM | POA: Diagnosis not present

## 2023-02-18 DIAGNOSIS — E041 Nontoxic single thyroid nodule: Secondary | ICD-10-CM | POA: Diagnosis not present

## 2023-02-19 DIAGNOSIS — E041 Nontoxic single thyroid nodule: Secondary | ICD-10-CM | POA: Diagnosis not present

## 2023-02-24 DIAGNOSIS — J3089 Other allergic rhinitis: Secondary | ICD-10-CM | POA: Diagnosis not present

## 2023-02-24 DIAGNOSIS — J3081 Allergic rhinitis due to animal (cat) (dog) hair and dander: Secondary | ICD-10-CM | POA: Diagnosis not present

## 2023-02-24 DIAGNOSIS — J301 Allergic rhinitis due to pollen: Secondary | ICD-10-CM | POA: Diagnosis not present

## 2023-03-03 DIAGNOSIS — J3089 Other allergic rhinitis: Secondary | ICD-10-CM | POA: Diagnosis not present

## 2023-03-03 DIAGNOSIS — J3081 Allergic rhinitis due to animal (cat) (dog) hair and dander: Secondary | ICD-10-CM | POA: Diagnosis not present

## 2023-03-03 DIAGNOSIS — J301 Allergic rhinitis due to pollen: Secondary | ICD-10-CM | POA: Diagnosis not present

## 2023-03-07 NOTE — Progress Notes (Signed)
 Cardiology Clinic Note   Date: 03/09/2023 ID: Shannon, [name] [birth year], MRN [id number]  Primary Cardiologist:  Sammy Crisp, MD  Chief Complaint   Shannon [name] is a 58 y.o. female who presents to the clinic today for routine follow up.   Patient Profile   Shannon [name] is followed by Dr. Nolan Battle for the history outlined below.      Past medical history significant for: CAD. LHC 11/25/2022 (NSTEMI): Mild to moderate nonobstructive CAD.  30 to 40% mid LAD.  Mildly reduced LV function with mid LV wall motion abnormality most consistent with Takotsubo. Recommend escalation of GDMT for nonischemic cardiomyopathy. Stress cardiomyopathy. Echo 11/25/2022: EF 45 to 50%.  Severe hypokinesis of the left ventricular, mid-- apical inferolateral wall.  Mild LVH.  Grade I DD.  Normal RV size and function.  No significant valvular abnormalities. Hyperlipidemia. Lipid panel 11/25/2022: LDL 101, HDL 73, TG 133, total 201. LPa 11/26/2022: 20.1. GERD. Hyperthyroidism.  In summary, patient presented to the ED via EMS from PCP  in October 2024 with complaints of chest pain.  Pain described as constant in the epigastrium and radiating to shoulders relieved by Nexium .  EKG showed new inferolateral T wave inversions.  X-ray showed no active cardiopulmonary disease.  CTA chest abdomen pelvis showed no aortic aneurysm/dissection no acute process in chest abdomen pelvis.  Incidental finding of right thyroid  nodule and enlarged thyroid  measuring 2.2 cm.  Initial labs: Potassium 3.8, calcium  8.2, creatinine 0.53, hemoglobin 14.1, TSH 1.031.  Troponin 1798>> 2596.  Patient underwent heart catheterization which showed mild to moderate nonobstructive CAD and wall motion abnormalities consistent with Takotsubo.  Patient was discharged on 11/26/2022.  Unable to add metoprolol  or Entresto secondary to soft BP.   Patient was last seen in the office by me on 12/05/2022 for hospital follow-up.  Patient was doing well  and interested in starting cardiac rehab.  She complained of mild medial upper arm discomfort since catheterization.  Her cath site was well-healed without drainage, erythema, edema.  2+ radial pulse.  She was instructed to contact the office if arm pain persisted.  She had not started atorvastatin  secondary to concern regarding side effects.  She was instructed to start atorvastatin  every other day to gauge tolerance and contact the office for consideration of rosuvastatin if not tolerating atorvastatin .     History of Present Illness    Today, patient is accompanied by her daughter. She reports she never heard from cardiac rehab so she began working out with her son in law at the gym. He is a Systems analyst. She does cardio and weight training twice a week and walks the other days of the week. Patient denies shortness of breath, dyspnea on exertion, lower extremity edema, orthopnea or PND. No palpitations. She continues to have epigastric discomfort she describes as "gas pain" that has been a longstanding issue for her and is relieved by belching. This is somewhat similar to the pain she had in October but it does not radiate through to back or to shoulders. She endorses poor sleep hygiene. She falls asleep easily but is unable to stay asleep getting up frequently throughout the night. She does not snore. She had better sleep quality when she was taking a small dose of xanax  nightly but that was discontinued 2 years ago. Suggested she talk to PCP about buspar to see if that can help her sleep. She also reports feeling as though she "hits a wall" in the last afternoon.  Suggested she take Toprol  at dinnertime to see if this improves. Patient reports right calf pain that began about 2 months ago. She initially thought it might be a pulled muscle. She denies edema, erythema or warmth in the area.     ROS: All other systems reviewed and are otherwise negative except as noted in History of Present  Illness.  EKGs/Labs Reviewed    EKG Interpretation Date/Time:  Monday March 09 2023 13:39:38 EST Ventricular Rate:  64 PR Interval:  152 QRS Duration:  78 QT Interval:  396 QTC Calculation: 408 R Axis:   12  Text Interpretation: Normal sinus rhythm Normal ECG When compared with ECG of 05-Dec-2022 08:02, T wave inversion no longer evident in Lateral leads Confirmed by Morey Ar [phone]) on 03/09/2023 1:45:40 PM   12/11/2022: ALT 12; AST 15; BUN 10; Creatinine, Ser 0.74; Potassium 3.8; Sodium 135   12/11/2022: Hemoglobin 13.6; WBC 12.9   11/24/2022: TSH 1.031       Physical Exam    VS:  BP 118/70   Pulse 68   Ht 5\' 2"  (1.575 m)   Wt 125 lb 9.6 oz (57 kg)   LMP 02/12/2013   SpO2 96%   BMI 22.97 kg/m  , BMI Body mass index is 22.97 kg/m.  GEN: Well nourished, well developed, in no acute distress. Neck: No JVD or carotid bruits. Cardiac:  RRR. No murmurs. No rubs or gallops.   Respiratory:  Respirations regular and unlabored. Clear to auscultation without rales, wheezing or rhonchi. GI: Soft, nontender, nondistended. Extremities: Radials/DP/PT 2+ and equal bilaterally. No clubbing or cyanosis. No edema.  Skin: Warm and dry, no rash. Neuro: Strength intact.  Assessment & Plan   CAD LHC 11/25/2022 showed mild to moderate nonobstructive CAD and wall motion abnormalities consistent with Takotsubo.  Patient reports "gas pain" relieved by belching that is somewhat different from prior to discomfort that went through to her back and across her shoulders. She is very active going to the gym for cardio and strength training twice a week and walking the other days without discomfort. EKG shows NSR today with resolution of T wave abnormality in lateral leads.  -Continue aspirin , Toprol , atorvastatin , as needed SL NTG.   Stress cardiomyopathy Echo 11/25/2022 showed EF 45 to 50%, wall motion abnormalities, Grade I DD.  Patient denies lower extremity edema, DOE, orthopnea or  PND.  Euvolemic and well compensated on exam. She is tolerating her medications well.  -Update echo in 2-3 months prior to follow up visit.  -Continue Toprol , Jardiance .   Hyperlipidemia LDL October 2024 101, not at goal.  Patient has been tolerating atorvastatin  10 mg without joint/muscle pain. She sees her PCP next month with plan to do fasting labs.  -Continue atorvastatin  at current dose.  -Discussed continuing physical activity and following Mediterranean diet.   Right lower extremity pain Patient reports a 2 month history of right calf discomfort. She initially felt it was a pulled muscle. She denies swelling. There is no edema, erythema, or warmth of the area on exam today. Calves are symmetric. 2+ PT/DP pulses bilaterally.  I have a low suspicion for DVT but will obtain US  for further evaluation.  -Right lower extremity venous US .   Disposition: Right lower extremity venous US . Repeat echo in 2-3 months. Follow up in 3 months or sooner as needed.    Signed, Lonell Rives. Eero Dini, DNP, NP-C

## 2023-03-09 ENCOUNTER — Ambulatory Visit (INDEPENDENT_AMBULATORY_CARE_PROVIDER_SITE_OTHER): Payer: BC Managed Care – PPO

## 2023-03-09 ENCOUNTER — Ambulatory Visit: Payer: BC Managed Care – PPO | Attending: Student | Admitting: Student

## 2023-03-09 ENCOUNTER — Encounter: Payer: Self-pay | Admitting: Student

## 2023-03-09 VITALS — BP 118/70 | HR 68 | Ht 62.0 in | Wt 125.6 lb

## 2023-03-09 DIAGNOSIS — M79661 Pain in right lower leg: Secondary | ICD-10-CM

## 2023-03-09 DIAGNOSIS — E538 Deficiency of other specified B group vitamins: Secondary | ICD-10-CM

## 2023-03-09 DIAGNOSIS — I2510 Atherosclerotic heart disease of native coronary artery without angina pectoris: Secondary | ICD-10-CM | POA: Diagnosis not present

## 2023-03-09 DIAGNOSIS — I5181 Takotsubo syndrome: Secondary | ICD-10-CM | POA: Diagnosis not present

## 2023-03-09 DIAGNOSIS — E785 Hyperlipidemia, unspecified: Secondary | ICD-10-CM | POA: Diagnosis not present

## 2023-03-09 MED ORDER — CYANOCOBALAMIN 1000 MCG/ML IJ SOLN
1000.0000 ug | Freq: Once | INTRAMUSCULAR | Status: AC
  Administered 2023-03-09: 1000 ug via INTRAMUSCULAR

## 2023-03-09 NOTE — Progress Notes (Signed)
After obtaining consent, and per orders of Dr. Scott, injection of B12 given IM in right deltoid by Teruko Joswick Lynn. Patient tolerated injection well.  

## 2023-03-09 NOTE — Patient Instructions (Addendum)
 Medication Instructions:  No changes at this time.   *If you need a refill on your cardiac medications before your next appointment, please call your pharmacy*   Lab Work: None  If you have labs (blood work) drawn today and your tests are completely normal, you will receive your results only by: MyChart Message (if you have MyChart) OR A paper copy in the mail If you have any lab test that is abnormal or we need to change your treatment, we will call you to review the results.   Testing/Procedures: Your physician has requested that you have an echocardiogram in 2 months. Echocardiography is a painless test that uses sound waves to create images of your heart. It provides your doctor with information about the size and shape of your heart and how well your heart's chambers and valves are working. This procedure takes approximately one hour. There are no restrictions for this procedure. Please do NOT wear cologne, perfume, aftershave, or lotions (deodorant is allowed). Please arrive 15 minutes prior to your appointment time.  Please note: We ask at that you not bring children with you during ultrasound (echo/ vascular) testing. Due to room size and safety concerns, children are not allowed in the ultrasound rooms during exams. Our front office staff cannot provide observation of children in our lobby area while testing is being conducted. An adult accompanying a patient to their appointment will only be allowed in the ultrasound room at the discretion of the ultrasound technician under special circumstances. We apologize for any inconvenience.  Your physician has requested that you have a lower or upper extremity venous duplex. This test is an ultrasound of the veins in the legs or arms. It looks at venous blood flow that carries blood from the heart to the legs or arms. Allow one hour for a Lower Venous exam. Allow thirty minutes for an Upper Venous exam. There are no restrictions or special  instructions.  Please note: We ask at that you not bring children with you during ultrasound (echo/ vascular) testing. Due to room size and safety concerns, children are not allowed in the ultrasound rooms during exams. Our front office staff cannot provide observation of children in our lobby area while testing is being conducted. An adult accompanying a patient to their appointment will only be allowed in the ultrasound room at the discretion of the ultrasound technician under special circumstances. We apologize for any inconvenience.    Follow-Up: At [facility], you and your health needs are our priority.  As part of our continuing mission to provide you with exceptional heart care, we have created designated Provider Care Teams.  These Care Teams include your primary Cardiologist (physician) and Advanced Practice Providers (APPs -  Physician Assistants and Nurse Practitioners) who all work together to provide you with the care you need, when you need it.   Your next appointment:   3 month(s)  Provider:   Sammy Crisp, MD or Morey Ar, NP

## 2023-03-10 DIAGNOSIS — J301 Allergic rhinitis due to pollen: Secondary | ICD-10-CM | POA: Diagnosis not present

## 2023-03-10 DIAGNOSIS — J3089 Other allergic rhinitis: Secondary | ICD-10-CM | POA: Diagnosis not present

## 2023-03-10 DIAGNOSIS — J3081 Allergic rhinitis due to animal (cat) (dog) hair and dander: Secondary | ICD-10-CM | POA: Diagnosis not present

## 2023-03-17 DIAGNOSIS — J301 Allergic rhinitis due to pollen: Secondary | ICD-10-CM | POA: Diagnosis not present

## 2023-03-17 DIAGNOSIS — J3089 Other allergic rhinitis: Secondary | ICD-10-CM | POA: Diagnosis not present

## 2023-03-17 DIAGNOSIS — J3081 Allergic rhinitis due to animal (cat) (dog) hair and dander: Secondary | ICD-10-CM | POA: Diagnosis not present

## 2023-03-18 ENCOUNTER — Encounter: Payer: Self-pay | Admitting: Internal Medicine

## 2023-03-18 ENCOUNTER — Telehealth (INDEPENDENT_AMBULATORY_CARE_PROVIDER_SITE_OTHER): Payer: BC Managed Care – PPO | Admitting: Internal Medicine

## 2023-03-18 VITALS — BP 126/72 | Ht 62.0 in | Wt 123.0 lb

## 2023-03-18 DIAGNOSIS — K219 Gastro-esophageal reflux disease without esophagitis: Secondary | ICD-10-CM | POA: Diagnosis not present

## 2023-03-18 DIAGNOSIS — G479 Sleep disorder, unspecified: Secondary | ICD-10-CM

## 2023-03-18 DIAGNOSIS — I5181 Takotsubo syndrome: Secondary | ICD-10-CM

## 2023-03-18 DIAGNOSIS — I2510 Atherosclerotic heart disease of native coronary artery without angina pectoris: Secondary | ICD-10-CM

## 2023-03-18 DIAGNOSIS — E0590 Thyrotoxicosis, unspecified without thyrotoxic crisis or storm: Secondary | ICD-10-CM | POA: Diagnosis not present

## 2023-03-18 DIAGNOSIS — E041 Nontoxic single thyroid nodule: Secondary | ICD-10-CM

## 2023-03-18 DIAGNOSIS — F419 Anxiety disorder, unspecified: Secondary | ICD-10-CM

## 2023-03-18 DIAGNOSIS — R5383 Other fatigue: Secondary | ICD-10-CM

## 2023-03-18 NOTE — Assessment & Plan Note (Signed)
Continues on aspirin. Taking jardiance. Continue on metoprolol - taking in the evening. ECHO - EF 45-50%, Grade I DD. Breathing stable.

## 2023-03-18 NOTE — Assessment & Plan Note (Signed)
Overall appears to be handling things relatively well. Follow. Notify me if feels needs further intervention.

## 2023-03-18 NOTE — Assessment & Plan Note (Signed)
Discussed her sleep concerns. Increased fatigue. Not sleeping well. Sleeps with mouth open. Concern regarding possible sleep apnea. Refer to pulmonary for further evaluation for possible sleep apnea.

## 2023-03-18 NOTE — Assessment & Plan Note (Signed)
S/p ablation.  Follow tsh.  

## 2023-03-18 NOTE — Assessment & Plan Note (Signed)
Cardiac cath 11/25/22 - Mild to moderate, nonobstructive coronary artery disease with 30-40% mid LAD stenosis.  No significant disease noted in the LMCA, LCx, or RCA.. Mildly reduced left ventricular systolic function with mid left ventricular wall motion abnormality most consistent with a Takotsubo variant. Normal left ventricular filling pressure.Continues on aspirin. Taking jardiance.  Has not started atorvastatin. Plans to start. Instructed to take toprol 12.5mg  in the evening. ECHO - EF 45-50%, GRADEI DD. Just saw cardiology. Metoprolol time taking adjusted. Follow. Overall stable.

## 2023-03-18 NOTE — Progress Notes (Signed)
Patient ID: Shannon [name], female   DOB: 06-11-1965, 58 y.o.   MRN: [id number]   Virtual Visit via video Note  I connected with Shannon [name] by a video enabled telemedicine application and verified that I am speaking with the correct person using two identifiers. Location patient: home Location provider: home Persons participating in the virtual visit: patient, provider  The limitations, risks, security and privacy concerns of performing an evaluation and management service by video and the availability of in person appointments have been discussed. It has also been discussed with the patient that there may be a patient responsible charge related to this service. The patient expressed understanding and agreed to proceed.   Reason for visit: work in appt  HPI: Reported feeling tired. Not sleeping well. Had f/u with cardiology 03/09/23. Working out in [employer] - cardio and [employer] Walking the other days of the week. Reported "gas pain" - epigastric discomfort. EKG 2/10 - NSR with resolution of T wave abnormality. Recommended continuing aspirin, toprol and atorvastatin. Recommended f/u echo in 2-3 months. Also reported problems with sleep. Gets up frequently throughout the night. Also reported feels as though she "hits a wall" in the late afternoon. Cardiology recommended changing toprol dose to dinnertime. Was experiencing right calf pain. Cardiology ordered lower extremity ultrasound. Scheduled. Saw endocrinology - f/u thyroid. S/p biopsy - ok.  Recommended f/u in one year. Increased fatigue. Having problems sleeping. Wearing her watch. Monitoring her sleep - documented multiple wake up times per night. Sleeps with mouth open. Not getting deep sleep. Discussed possible sleep apnea.    ROS: See pertinent positives and negatives per HPI.  Past Medical History:  Diagnosis Date   Anemia    Anxiety    Breast pain, right 11/07/2012   Breathing difficulty 06/20/2014   Environmental allergies     GERD (gastroesophageal reflux disease)    Hiatal hernia    small   History of migraine headaches    Hyperthyroidism    s/p ablation   Vaginitis 01/16/2013    Past Surgical History:  Procedure Laterality Date   BREAST BIOPSY Right 12/05/2015   neg   CESAREAN SECTION  11/98   Dr Colletta [place]   CHOLECYSTECTOMY  11/05   Dr Michela Pitcher   JOINT REPLACEMENT Left 2017   hip   LEFT HEART CATH AND CORONARY ANGIOGRAPHY N/A 11/25/2022   Procedure: LEFT HEART CATH AND CORONARY ANGIOGRAPHY;  Surgeon: Yvonne Kendall, MD;  Location: [facility] INVASIVE CV LAB;  Service: Cardiovascular;  Laterality: N/A;   NASAL SINUS SURGERY  11/93   SEPTOPLASTY  5/90   Dr Esmeralda Links   TUBAL LIGATION  02/1998   UMBILICAL HERNIA REPAIR  02/1998    Family History  Problem Relation Age of Onset   Heart disease Father    Hypertension Father    Thyroid disease Father    Hypercholesterolemia Father    Kidney cancer Father    Leukemia Father        hairy cell   Hypercholesterolemia Mother    Thyroid disease Mother    Breast cancer Mother 2   Breast cancer Other        maternal great grandmother   Heart disease Maternal Grandfather        MI - age 80   Prostate cancer Paternal Grandfather    Pancreatic cancer Paternal Grandfather    Lung cancer Paternal Grandfather    Bladder Cancer Neg Hx     SOCIAL HX: reviewed.    Current  Outpatient Medications:    acetaminophen (TYLENOL) 500 MG tablet, Take 1,000 mg by mouth every 6 (six) hours as needed for moderate pain (pain score 4-6) or mild pain (pain score 1-3)., Disp: , Rfl:    ALPRAZolam (XANAX) 0.25 MG tablet, TAKE ONE TABLET DAILY IF NEEDED FOR ANXIETY, Disp: 20 tablet, Rfl: 0   aspirin EC 81 MG tablet, Take 1 tablet (81 mg total) by mouth daily. Swallow whole., Disp: 30 tablet, Rfl: 12   atorvastatin (LIPITOR) 20 MG tablet, Take 1 tablet (20 mg total) by mouth daily., Disp: 90 tablet, Rfl: 2   cholecalciferol (VITAMIN D3) 25 MCG (1000 UNIT) tablet, Take 1,000 Units  by mouth daily., Disp: , Rfl:    Cyanocobalamin 1000 MCG/ML KIT, Inject 1,000 mcg as directed every 30 (thirty) days., Disp: , Rfl:    empagliflozin (JARDIANCE) 10 MG TABS tablet, Take 1 tablet (10 mg total) by mouth daily., Disp: 90 tablet, Rfl: 1   EPINEPHrine 0.3 mg/0.3 mL IJ SOAJ injection, Inject 0.3 mg into the muscle as needed for anaphylaxis., Disp: , Rfl:    esomeprazole (GNP ESOMEPRAZOLE MAGNESIUM) 20 MG capsule, Take 40 mg by mouth in the morning., Disp: , Rfl:    metoprolol succinate (TOPROL XL) 25 MG 24 hr tablet, Take 0.5 tablets (12.5 mg total) by mouth daily. At bedtime, Disp: 45 tablet, Rfl: 3   nitroGLYCERIN (NITROSTAT) 0.4 MG SL tablet, Place 1 tablet (0.4 mg total) under the tongue every 5 (five) minutes as needed for chest pain., Disp: 25 tablet, Rfl: 3   ondansetron (ZOFRAN-ODT) 4 MG disintegrating tablet, Take 1 tablet (4 mg total) by mouth every 6 (six) hours as needed for nausea or vomiting., Disp: 20 tablet, Rfl: 0  EXAM:  GENERAL: alert, oriented, appears well and in no acute distress  HEENT: atraumatic, conjunttiva clear, no obvious abnormalities on inspection of external nose and ears  NECK: normal movements of the head and neck  LUNGS: on inspection no signs of respiratory distress, breathing rate appears normal, no obvious gross SOB, gasping or wheezing  CV: no obvious cyanosis  PSYCH/NEURO: pleasant and cooperative, no obvious depression or anxiety, speech and thought processing grossly intact  ASSESSMENT AND PLAN:  Discussed the following assessment and plan:  Problem List Items Addressed This Visit     Anxiety - Primary   Overall appears to be handling things relatively well. Follow. Notify me if feels needs further intervention.       CAD (coronary artery disease)   Cardiac cath 11/25/22 - Mild to moderate, nonobstructive coronary artery disease with 30-40% mid LAD stenosis.  No significant disease noted in the LMCA, LCx, or RCA.. Mildly reduced  left ventricular systolic function with mid left ventricular wall motion abnormality most consistent with a Takotsubo variant. Normal left ventricular filling pressure.Continues on aspirin. Taking jardiance.  Has not started atorvastatin. Plans to start. Instructed to take toprol 12.5mg  in the evening. ECHO - EF 45-50%, GRADEI DD. Just saw cardiology. Metoprolol time taking adjusted. Follow. Overall stable.       GERD (gastroesophageal reflux disease)   No increased acid reflux. Continues on nexium.       Hyperthyroidism   S/p ablation. Follow tsh.       Sleep difficulties   Discussed her sleep concerns. Increased fatigue. Not sleeping well. Sleeps with mouth open. Concern regarding possible sleep apnea. Refer to pulmonary for further evaluation for possible sleep apnea.       Relevant Orders   Ambulatory referral to  Pulmonology   Stress-induced cardiomyopathy   Continues on aspirin. Taking jardiance. Continue on metoprolol - taking in the evening. ECHO - EF 45-50%, Grade I DD. Breathing stable.       Thyroid nodule   S/p biopsy. Ok. Continue f/u with endocrinology.       Other Visit Diagnoses       Other fatigue       Relevant Orders   Ambulatory referral to Pulmonology       Return if symptoms worsen or fail to improve, for keep scheduled. .   I discussed the assessment and treatment plan with the patient. The patient was provided an opportunity to ask questions and all were answered. The patient agreed with the plan and demonstrated an understanding of the instructions.   The patient was advised to call back or seek an in-person evaluation if the symptoms worsen or if the condition fails to improve as anticipated.    Dale Frost, MD

## 2023-03-18 NOTE — Assessment & Plan Note (Signed)
S/p biopsy. Ok. Continue f/u with endocrinology.

## 2023-03-18 NOTE — Assessment & Plan Note (Signed)
No increased acid reflux. Continues on nexium.

## 2023-03-23 ENCOUNTER — Encounter: Payer: Self-pay | Admitting: Sleep Medicine

## 2023-03-23 ENCOUNTER — Ambulatory Visit: Payer: BC Managed Care – PPO | Admitting: Sleep Medicine

## 2023-03-23 VITALS — BP 112/68 | HR 74 | Temp 97.1°F | Ht 62.0 in | Wt 124.6 lb

## 2023-03-23 DIAGNOSIS — G4710 Hypersomnia, unspecified: Secondary | ICD-10-CM

## 2023-03-23 DIAGNOSIS — G4733 Obstructive sleep apnea (adult) (pediatric): Secondary | ICD-10-CM

## 2023-03-23 DIAGNOSIS — I2510 Atherosclerotic heart disease of native coronary artery without angina pectoris: Secondary | ICD-10-CM | POA: Diagnosis not present

## 2023-03-23 NOTE — Progress Notes (Signed)
 Name:Shannon [name] MRN: [id number] DOB: 29-Jan-1965   CHIEF COMPLAINT:  EXCESSIVE DAYTIME SLEEPINESS   HISTORY OF PRESENT ILLNESS:  Shannon [name] is a 58 y.o. w/ a h/o CAD, hyperlipidemia and GERD who presents for c/o fatigue which has been presents for several years. Reports nocturnal awakenings due to unclear reasons, occasionally has difficulty falling back to sleep. Denies any significant weight changes. Denies morning headaches, night sweats, RLS symptoms or dream enactment. Reports a family history of sleep apnea. Denies drowsy driving. Drinks 2-3 decaf coffee daily, denies alcohol, tobacco or illicit drug use.   Bedtime 9-9:30 pm Sleep onset 20 mins Rise time 6:30 am   EPWORTH SLEEP SCORE 2    03/23/2023    1:00 PM  Results of the Epworth flowsheet  Sitting and reading 0  Watching TV 2  Sitting, inactive in a public place (e.g. a theatre or a meeting) 0  As a passenger in a car for an hour without a break 0  Lying down to rest in the afternoon when circumstances permit 0  Sitting and talking to someone 0  Sitting quietly after a lunch without alcohol 0  In a car, while stopped for a few minutes in traffic 0  Total score 2      PAST MEDICAL HISTORY :   has a past medical history of Anemia, Anxiety, Breast pain, right (11/07/2012), Breathing difficulty (06/20/2014), Environmental allergies, GERD (gastroesophageal reflux disease), Hiatal hernia, History of migraine headaches, Hyperthyroidism, and Vaginitis (01/16/2013).  has a past surgical history that includes Septoplasty (5/90); Nasal sinus surgery (11/93); Cesarean section (11/98); Tubal ligation (02/1998); Umbilical hernia repair (02/1998); Cholecystectomy (11/05); Joint replacement (Left, 2017); Breast biopsy (Right, 12/05/2015); and LEFT HEART CATH AND CORONARY ANGIOGRAPHY (N/A, 11/25/2022). Prior to Admission medications   Medication Sig Start Date End Date Taking? Authorizing Provider  acetaminophen (TYLENOL)  500 MG tablet Take 1,000 mg by mouth every 6 (six) hours as needed for moderate pain (pain score 4-6) or mild pain (pain score 1-3).   Yes [provider]  ALPRAZolam Prudy Feeler) 0.25 MG tablet TAKE ONE TABLET DAILY IF NEEDED FOR ANXIETY 01/08/23  Yes Dale Hospers, MD  aspirin EC 81 MG tablet Take 1 tablet (81 mg total) by mouth daily. Swallow whole. 11/27/22  Yes Arnetha Courser, MD  atorvastatin (LIPITOR) 20 MG tablet Take 1 tablet (20 mg total) by mouth daily. 11/27/22  Yes Arnetha Courser, MD  cholecalciferol (VITAMIN D3) 25 MCG (1000 UNIT) tablet Take 1,000 Units by mouth daily.   Yes [provider]  Cyanocobalamin 1000 MCG/ML KIT Inject 1,000 mcg as directed every 30 (thirty) days.   Yes [provider]  empagliflozin (JARDIANCE) 10 MG TABS tablet Take 1 tablet (10 mg total) by mouth daily. 11/26/22  Yes Arnetha Courser, MD  EPINEPHrine 0.3 mg/0.3 mL IJ SOAJ injection Inject 0.3 mg into the muscle as needed for anaphylaxis.   Yes [provider]  metoprolol succinate (TOPROL XL) 25 MG 24 hr tablet Take 0.5 tablets (12.5 mg total) by mouth daily. At bedtime 12/05/22  Yes Wittenborn, Gavin Pound, NP  nitroGLYCERIN (NITROSTAT) 0.4 MG SL tablet Place 1 tablet (0.4 mg total) under the tongue every 5 (five) minutes as needed for chest pain. 11/26/22  Yes Antonieta Iba, MD  omeprazole (PRILOSEC) 40 MG capsule Take 40 mg by mouth daily.   Yes [provider]  ondansetron (ZOFRAN-ODT) 4 MG disintegrating tablet Take 1 tablet (4 mg total) by mouth every 6 (  six) hours as needed for nausea or vomiting. 04/23/22  Yes Ward, Layla Maw, DO   Allergies  Allergen Reactions   Codeine Other (See Comments)    Severe Headache  Other Reaction(s): headache   Decongest-Aid [Pseudoephedrine] Other (See Comments)    Increased heart rate   Flagyl [Metronidazole]    Misc. Sulfonamide Containing Compounds Other (See Comments)   Sulfa Antibiotics     FAMILY HISTORY:  family  history includes Breast cancer in an other family member; Breast cancer (age of onset: 65) in her mother; Heart disease in her father and maternal grandfather; Hypercholesterolemia in her father and mother; Hypertension in her father; Kidney cancer in her father; Leukemia in her father; Lung cancer in her paternal grandfather; Pancreatic cancer in her paternal grandfather; Prostate cancer in her paternal grandfather; Thyroid disease in her father and mother. SOCIAL HISTORY:  reports that she quit smoking about 23 years ago. Her smoking use included cigarettes. She has never used smokeless tobacco. She reports that she does not drink alcohol and does not use drugs.   Review of Systems:  Gen:  Denies  fever, sweats, chills weight loss  HEENT: Denies blurred vision, double vision, ear pain, eye pain, hearing loss, nose bleeds, sore throat Cardiac:  No dizziness, chest pain or heaviness, chest tightness,edema, No JVD Resp:   No cough, -sputum production, -shortness of breath,-wheezing, -hemoptysis,  Gi: Denies swallowing difficulty, stomach pain, nausea or vomiting, diarrhea, constipation, bowel incontinence Gu:  Denies bladder incontinence, burning urine Ext:   Denies Joint pain, stiffness or swelling Skin: Denies  skin rash, easy bruising or bleeding or hives Endoc:  Denies polyuria, polydipsia , polyphagia or weight change Psych:   Denies depression, insomnia or hallucinations  Other:  All other systems negative  VITAL SIGNS: BP 112/68 (BP Location: Right Arm, Cuff Size: Normal)   Pulse 74   Temp (!) 97.1 F (36.2 C)   Ht 5\' 2"  (1.575 m)   Wt 124 lb 9.6 oz (56.5 kg)   LMP 02/12/2013   SpO2 99%   BMI 22.79 kg/m    Physical Examination:   General Appearance: No distress  EYES PERRLA, EOM intact.   NECK Supple, No JVD Mallampati III Pulmonary: normal breath sounds, No wheezing.  CardiovascularNormal S1,S2.  No m/r/g.   Abdomen: Benign, Soft, non-tender. Skin:   warm, no rashes,  no ecchymosis  Extremities: normal, no cyanosis, clubbing. Neuro:without focal findings,  speech normal  PSYCHIATRIC: Mood, affect within normal limits.    ASSESSMENT AND PLAN  OSA I suspect that OSA is likely present due to clinical presentation. Discussed the consequences of untreated sleep apnea. Advised not to drive drowsy for safety of patient and others. Will complete further evaluation with a home sleep study and follow up to review results.    CAD Stable, on current management. Following with PCP.    MEDICATION ADJUSTMENTS/LABS AND TESTS ORDERED: Recommend Sleep Study   Patient  satisfied with Plan of action and management. All questions answered  Follow up to review HST results and treatment plan.   I spent a total of 30 minutes reviewing chart data, face-to-face evaluation with the patient, counseling and coordination of care as detailed above.    Tempie Hoist, M.D.  Sleep Medicine [facility]

## 2023-03-24 DIAGNOSIS — J301 Allergic rhinitis due to pollen: Secondary | ICD-10-CM | POA: Diagnosis not present

## 2023-03-24 DIAGNOSIS — J3081 Allergic rhinitis due to animal (cat) (dog) hair and dander: Secondary | ICD-10-CM | POA: Diagnosis not present

## 2023-03-24 DIAGNOSIS — J3089 Other allergic rhinitis: Secondary | ICD-10-CM | POA: Diagnosis not present

## 2023-03-27 ENCOUNTER — Ambulatory Visit: Payer: BC Managed Care – PPO | Attending: Student

## 2023-03-27 DIAGNOSIS — M79661 Pain in right lower leg: Secondary | ICD-10-CM

## 2023-03-30 ENCOUNTER — Encounter: Payer: Self-pay | Admitting: Pain Medicine

## 2023-03-30 ENCOUNTER — Ambulatory Visit: Attending: Pain Medicine | Admitting: Pain Medicine

## 2023-03-30 VITALS — BP 141/57 | HR 55 | Temp 98.4°F | Resp 16 | Ht 62.0 in | Wt 120.0 lb

## 2023-03-30 DIAGNOSIS — G8929 Other chronic pain: Secondary | ICD-10-CM

## 2023-03-30 DIAGNOSIS — M9904 Segmental and somatic dysfunction of sacral region: Secondary | ICD-10-CM | POA: Diagnosis not present

## 2023-03-30 DIAGNOSIS — M545 Low back pain, unspecified: Secondary | ICD-10-CM | POA: Diagnosis not present

## 2023-03-30 DIAGNOSIS — M461 Sacroiliitis, not elsewhere classified: Secondary | ICD-10-CM | POA: Diagnosis not present

## 2023-03-30 DIAGNOSIS — M5388 Other specified dorsopathies, sacral and sacrococcygeal region: Secondary | ICD-10-CM | POA: Diagnosis not present

## 2023-03-30 DIAGNOSIS — M533 Sacrococcygeal disorders, not elsewhere classified: Secondary | ICD-10-CM

## 2023-03-30 NOTE — Patient Instructions (Signed)

## 2023-03-30 NOTE — Progress Notes (Signed)
 PROVIDER NOTE: Information contained herein reflects review and annotations entered in association with encounter. Interpretation of such information and data should be left to medically-trained personnel. Information provided to patient can be located elsewhere in the medical record under "Patient Instructions". Document created using STT-dictation technology, any transcriptional errors that may result from process are unintentional.    Patient: Shannon [name]  Service Category: E/M  Provider: Oswaldo Done, MD  DOB: 08-15-1965  DOS: 03/30/2023  Referring Provider: Dale Placedo, MD  MRN: [id number]  Specialty: Interventional Pain Management  PCP: Dale Danbury, MD  Type: Established Patient  Setting: Ambulatory outpatient    Location: Office  Delivery: Face-to-face     HPI  [name], a 58 y.o. year old female, is here today because of her Chronic bilateral low back pain without sciatica [M54.50, G89.29]. [name] primary complain today is Other (Buttocks, upper bilateral )  Pertinent problems: [name] has Migraine; Epigastric pain; Chronic low back pain (1ry area of Pain) (Bilateral) (R>L) w/o sciatica; Lumbar lateral recess stenosis (L4-5) (Left); Lumbar facet hypertrophy; Chronic lumbar radicular pain (intermittent) (L4 Dermatome) (Left); Chronic hip pain (resolved after hip replacement) (Left); Lumbar spondylosis; Osteoarthritis of sacroiliac joint (Bilateral) (R>L); History of hip replacement (Left); Chronic sacroiliac joint pain (Right); Chronic myofascial pain syndrome (Right Gluteous Muscle); Chronic pain syndrome; Other specified dorsopathies, sacral and sacrococcygeal region; Lumbar Facet syndrome (Bilateral) (R>L); Spondylosis without myelopathy or radiculopathy, lumbosacral region; Chronic toe pain (Right); Great toe pain (Right); Osteoarthritis of great toe joint (Right); DDD (degenerative disc disease), lumbosacral; Right calf pain; Cervicalgia; Cervical myofascial pain  syndrome (Left); Occipital neuralgia (Left); DDD (degenerative disc disease), cervical (C5-6, C6-7); Grade 1 Anterolisthesis of C5/C6; Cervical facet arthropathy (Bilateral) (L>R); Cervical facet syndrome (Bilateral); Temporomandibular joint (TMJ) pain; Neck pain; Breast pain; Chronic hip pain (Right); Somatic dysfunction of sacroiliac joint (Right); Sacroiliac joint dysfunction (Right); Osteoarthritis of hip (Right); Impaired range of motion of hip (Right); Greater trochanteric bursitis (Right); Bursitis of hip (Right); Herpes zoster; Chronic low back pain (1ry area of Pain) (Left) w/o sciatica; and Chronic sacroiliac joint pain (Bilateral) on their pertinent problem list. Pain Assessment: Severity of Chronic pain is reported as a 3 /10. Location: Buttocks Left, Right, Upper/denies. Onset: More than a month ago. Quality: Discomfort, Aching. Timing: Intermittent. Modifying factor(s): has tried chiropractor, accupuncture OTC meds, ice/heat and nothing seems to be helping.. Vitals:  height is 5\' 2"  (1.575 m) and weight is 120 lb (54.4 kg). Her temporal temperature is 98.4 F (36.9 C). Her blood pressure is 141/57 (abnormal) and her pulse is 55 (abnormal). Her respiration is 16 and oxygen saturation is 100%.  BMI: Estimated body mass index is 21.95 kg/m as calculated from the following:   Height as of this encounter: 5\' 2"  (1.575 m).   Weight as of this encounter: 120 lb (54.4 kg). Last encounter: 04/10/2022. Last procedure: 03/25/2022.  Left lumbar facet MBB #2.   Reason for encounter: evaluation of worsening, or previously known (established) problem.   Discussed the use of AI scribe software for clinical note transcription with the patient, who gave verbal consent to proceed.  History of Present Illness   Shannon [name] is a 58 year old female who presents with a flare-up of chronic low back pain.  She is experiencing a flare-up of her chronic low back pain, located bilaterally over the sacrum,  particularly above the posterior superior iliac spine (PSIS). The pain is exacerbated by movements such as crossing her legs  and is slightly relieved by pressure. Her last intervention was a facet injection on the right side, while the left side has not been treated for a long time.  She had a myocardial infarction at the end of October, which has influenced her current treatment options for back pain. There is concern about the increased risk of complications if undergoing procedures such as SI joint injections within six months post-MI.  She is currently on medications for her heart condition, although specific medications and dosages were not discussed.       Pharmacotherapy Assessment  Analgesic: No chronic opioid analgesics therapy prescribed by our practice..   Monitoring: [place] PMP: PDMP reviewed during this encounter.       Pharmacotherapy: No side-effects or adverse reactions reported. Compliance: No problems identified. Effectiveness: Clinically acceptable.  Vernie Ammons, RN  03/30/2023  2:14 PM  Sign when Signing Visit Safety precautions to be maintained throughout the outpatient stay will include: orient to surroundings, keep bed in low position, maintain call bell within reach at all times, provide assistance with transfer out of bed and ambulation.     No results found for: "CBDTHCR" No results found for: "D8THCCBX" No results found for: "D9THCCBX"  UDS:  No results found for: "SUMMARY"    ROS  Constitutional: Denies any fever or chills Gastrointestinal: No reported hemesis, hematochezia, vomiting, or acute GI distress Musculoskeletal: Denies any acute onset joint swelling, redness, loss of ROM, or weakness Neurological: No reported episodes of acute onset apraxia, aphasia, dysarthria, agnosia, amnesia, paralysis, loss of coordination, or loss of consciousness  Medication Review  ALPRAZolam, Cyanocobalamin, EPINEPHrine, acetaminophen, aspirin EC, atorvastatin,  cholecalciferol, empagliflozin, metoprolol succinate, nitroGLYCERIN, omeprazole, and ondansetron  History Review  Allergy: Ms. Horan is allergic to codeine, decongest-aid [pseudoephedrine], flagyl [metronidazole], misc. sulfonamide containing compounds, and sulfa antibiotics. Drug: Ms. Harpenau  reports no history of drug use. Alcohol:  reports no history of alcohol use. Tobacco:  reports that she quit smoking about 23 years ago. Her smoking use included cigarettes. She has never used smokeless tobacco. Social: Ms. Badami  reports that she quit smoking about 23 years ago. Her smoking use included cigarettes. She has never used smokeless tobacco. She reports that she does not drink alcohol and does not use drugs. Medical:  has a past medical history of Anemia, Anxiety, Breast pain, right (11/07/2012), Breathing difficulty (06/20/2014), Environmental allergies, GERD (gastroesophageal reflux disease), Hiatal hernia, History of migraine headaches, Hyperthyroidism, and Vaginitis (01/16/2013). Surgical: Ms. Weingart  has a past surgical history that includes Septoplasty (5/90); Nasal sinus surgery (11/93); Cesarean section (11/98); Tubal ligation (02/1998); Umbilical hernia repair (02/1998); Cholecystectomy (11/05); Joint replacement (Left, 2017); Breast biopsy (Right, 12/05/2015); and LEFT HEART CATH AND CORONARY ANGIOGRAPHY (N/A, 11/25/2022). Family: family history includes Breast cancer in an other family member; Breast cancer (age of onset: 30) in her mother; Heart disease in her father and maternal grandfather; Hypercholesterolemia in her father and mother; Hypertension in her father; Kidney cancer in her father; Leukemia in her father; Lung cancer in her paternal grandfather; Pancreatic cancer in her paternal grandfather; Prostate cancer in her paternal grandfather; Thyroid disease in her father and mother.  Laboratory Chemistry Profile   Renal Lab Results  Component Value Date   BUN 10 12/11/2022    CREATININE 0.74 12/11/2022   GFR 88.67 12/11/2022   GFRAA >60 05/02/2014   GFRNONAA >60 11/26/2022    Hepatic Lab Results  Component Value Date   AST 15 12/11/2022   ALT 12 12/11/2022  ALBUMIN 4.2 12/11/2022   ALKPHOS 68 12/11/2022   LIPASE 21 04/23/2022    Electrolytes Lab Results  Component Value Date   NA 135 12/11/2022   K 3.8 12/11/2022   CL 100 12/11/2022   CALCIUM 9.4 12/11/2022   MG 2.1 11/26/2022    Bone Lab Results  Component Value Date   VD25OH 21.86 (L) 05/23/2022   25OHVITD1 28 (L) 10/05/2017   25OHVITD2 <1.0 10/05/2017   25OHVITD3 28 10/05/2017    Inflammation (CRP: Acute Phase) (ESR: Chronic Phase) Lab Results  Component Value Date   CRP 2 10/05/2017   ESRSEDRATE 7 10/05/2017         Note: Above Lab results reviewed.  Recent Imaging Review  VAS [place] LOWER EXTREMITY VENOUS (DVT)  Lower Venous DVT Study  Patient Name:  YASLIN KIRTLEY  Date of Exam:   03/27/2023 Medical Rec #: [id number]       Accession #:    [id number] Date of Birth: Oct 03, 1965       Patient Gender: F Patient Age:   31 years Exam Location:  [place] Procedure:      VAS [place] LOWER EXTREMITY VENOUS (DVT) Referring Phys: Carlos Levering  --------------------------------------------------------------------------------   Indications: Pain. Other Indications: Pain right calf for 3-4 weeks. No swelling. Denied SOB and                    CP.  Risk Factors: None identified. Comparison Study: No previous  Performing Technologist: Quentin Ore RDMS, RVT, RDCS    Examination Guidelines: A complete evaluation includes B-mode imaging, spectral Doppler, color Doppler, and power Doppler as needed of all accessible portions of each vessel. Bilateral testing is considered an integral part of a complete examination. Limited examinations for reoccurring indications may be performed as noted. The reflux portion of the exam is performed with the patient in reverse Trendelenburg.      +---------+---------------+---------+-----------+----------+--------------+ RIGHT    CompressibilityPhasicitySpontaneityPropertiesThrombus Aging +---------+---------------+---------+-----------+----------+--------------+ CFV      Full           Yes      Yes                                 +---------+---------------+---------+-----------+----------+--------------+ SFJ      Full           Yes      Yes                                 +---------+---------------+---------+-----------+----------+--------------+ FV Prox  Full           Yes      Yes                                 +---------+---------------+---------+-----------+----------+--------------+ FV Mid   Full           Yes      Yes                                 +---------+---------------+---------+-----------+----------+--------------+ FV DistalFull           Yes      Yes                                 +---------+---------------+---------+-----------+----------+--------------+  PFV      Full           Yes      Yes                                 +---------+---------------+---------+-----------+----------+--------------+ POP      Full           Yes      Yes                                 +---------+---------------+---------+-----------+----------+--------------+ PTV      Full           Yes      Yes                                 +---------+---------------+---------+-----------+----------+--------------+ PERO     Full           Yes      Yes                                 +---------+---------------+---------+-----------+----------+--------------+ Soleal   Full                                                        +---------+---------------+---------+-----------+----------+--------------+ Gastroc  Full                                                        +---------+---------------+---------+-----------+----------+--------------+ GSV      Full            Yes      Yes                                 +---------+---------------+---------+-----------+----------+--------------+ SSV      Full                                                        +---------+---------------+---------+-----------+----------+--------------+             Summary: RIGHT: - No evidence of deep vein thrombosis in the lower extremity. No indirect evidence of obstruction proximal to the inguinal ligament.      LEFT: - No evidence of common femoral vein obstruction.       *See table(s) above for measurements and observations.  Electronically signed by Coral Else MD on 03/29/2023 at 9:29:38 PM.      Final   Note: Reviewed        Physical Exam  General appearance: Well nourished, well developed, and well hydrated. In no apparent acute distress Mental status: Alert, oriented x 3 (person, place, & time)       Respiratory: No evidence of acute respiratory distress Eyes: PERLA  Vitals: BP (!) 141/57 (BP Location: Left Arm, Patient Position: Sitting, Cuff Size: Normal)   Pulse (!) 55   Temp 98.4 F (36.9 C) (Temporal)   Resp 16   Ht 5\' 2"  (1.575 m)   Wt 120 lb (54.4 kg)   LMP 02/12/2013   SpO2 100%   BMI 21.95 kg/m  BMI: Estimated body mass index is 21.95 kg/m as calculated from the following:   Height as of this encounter: 5\' 2"  (1.575 m).   Weight as of this encounter: 120 lb (54.4 kg). Ideal: Ideal body weight: 50.1 kg (110 lb 7.2 oz) Adjusted ideal body weight: 51.8 kg (114 lb 4.3 oz)  Physical Exam   MUSCULOSKELETAL: Mild tenderness over the sacrum bilaterally with pressure. Pain elicited on leg crossing maneuver over the sacrum bilaterally.      Physical Examination Findings: Positive Sacral Thrust (Sacral Spring, Downward Pressure): (Y) Positive FABER maneuver (Patrick's): (Y) Positive SI distraction (Gapping): (Y) Positive SI compression (Approximation): (Y) Positive Thigh Thrust:  (Y) Positive Gaenslen's:  (Y) Positive Sacral Sulcus Tenderness: (Y)  Assessment   Diagnosis Status  1. Chronic low back pain (1ry area of Pain) (Bilateral) (R>L) w/o sciatica   2. Osteoarthritis of sacroiliac joint (Bilateral) (R>L)   3. Chronic sacroiliac joint pain (Bilateral)   4. Other specified dorsopathies, sacral and sacrococcygeal region   5. Somatic dysfunction of sacroiliac joint (Right)   6. Sacroiliac joint dysfunction (Right)    Having a Flare-up Worsened Worsened   Updated Problems: Problem  Chronic sacroiliac joint pain (Bilateral)    Plan of Care  Problem-specific:  Assessment and Plan    Low Back Pain   She presents with a flare-up of bilateral low back pain over the sacrum, described as mild to moderate. This is a long-standing issue with previous right-side facet injections, while the left side has been untreated for some time. Physical examination shows mild sacral tenderness. There is an increased risk of injections post-myocardial infarction (MI), with studies indicating potential lethal outcomes if administered within six months post-MI. She prefers to wait longer for injections. A cardiologist consultation is advised to assess the safety of SI joint injections. Place a PRN order for SI joint injections without sedation, pending cardiologist approval.  Post-Myocardial Infarction Status   She experienced a myocardial infarction at the end of October. Current guidelines suggest avoiding injections or anesthesia within six months post-MI due to increased risk of adverse events, including potentially lethal outcomes. A cardiologist consultation is advised to assess the risk of undergoing SI joint injections.  Vitamin D Deficiency Screening   There is concern about the necessity of vitamin D testing due to Medicare not covering the cost unless there is a diagnosed deficiency. It is explained that vitamin D levels can influence pain perception, and testing is recommended. Discuss the  importance of vitamin D testing for pain management and provide a form to sign for Medicare coverage.  Follow-up   Schedule SI joint injection if approved by the cardiologist.       Ms. NEVEEN DAPONTE has a current medication list which includes the following long-term medication(s): atorvastatin, metoprolol succinate, and nitroglycerin.  Pharmacotherapy (Medications Ordered): No orders of the defined types were placed in this encounter.  Orders:  Orders Placed This Encounter  Procedures   SACROILIAC JOINT INJECTION    Physical Examination Findings: Positive Sacral Thrust (Sacral Spring, Downward Pressure): (Y) Positive FABER maneuver (Patrick's): (Y) Positive SI distraction (Gapping): (Y) Positive SI compression (Approximation): (Y) Positive Thigh  Thrust:  (Y) Positive Gaenslen's: (Y) Positive Sacral Sulcus Tenderness: (Y)    Standing Status:   Standing    Number of Occurrences:   1    Expiration Date:   05/30/2023    Scheduling Instructions:     Procedure: Sacroiliac Joint Injection     Side  Laterality: Bilateral     Sedation: No Sedation.     Timeframe: PRN (patient will call)    Where will this procedure be performed?:   [facility]   Follow-up plan:   Return for (PRN) (Clinic): (B) SI Blk R6L3.      Interventional Therapies  Risk  Complexity Considerations:   WNL   Planned  Pending:   Therapeutic bilateral SI joint block R6L3    Under consideration:   Diagnostic left lumbar facet MBB #3   Possible right lumbar facet RFA #1  Possible right SI joint RFA #1  Possible left SI joint RFA #1  Therapeutic left cervical facet RFA #1    Completed:   Palliative right gluteal MNB  Palliative right SI joint Blk x5 (07/04/2021) (100/90/100/100)  Palliative left SI joint Blk x2 (09/28/2018) (100/100/100/90-100)  Palliative right L4-5 LESI x1 (12/10/2015) (100/100/100/100)  Palliative left L4-5 LESI x1 (01/11/2015) (100/100/95/95)  Therapeutic right  trochanteric bursa inj. x1 (04/09/2021) (100/100/100/100)  Therapeutic right IA hip inj. x1 (04/09/2021) (100/100/100/100)  Palliative left IA hip injection x2 (06/14/2015) (100/100/80/80-90)  Palliative right lumbar facet MBB x3 (07/15/2018) (100/100/90/100)  Diagnostic left lumbar facet MBB x2 (03/25/2022) (100/100/100/100)  Diagnostic right great toe IA inj. x1  Palliative right IA small joint inj. 1st MTP (Dorsal Metatarsophalangeal ) x1 (10/15/2017) (75/75/75/50-75)  Diagnostic left cervical facet MBB x1 (09/13/2019) (100/100/95/>75)    Therapeutic  Palliative (PRN) options:   Palliative right gluteal MNB Palliative SI joint block   Palliative L4-5 LESI   Palliative lumbar facet block   Diagnostic great toe IA injection   Palliative right IA small joint injection 1st MTP (Dorsal Metatarsophalangeal ) #2  Diagnostic cervical facet MBB       Recent Visits No visits were found meeting these conditions. Showing recent visits within past 90 days and meeting all other requirements Today's Visits Date Type Provider Dept  03/30/23 Office Visit Delano Metz, MD [facility]  Showing today's visits and meeting all other requirements Future Appointments No visits were found meeting these conditions. Showing future appointments within next 90 days and meeting all other requirements  I discussed the assessment and treatment plan with the patient. The patient was provided an opportunity to ask questions and all were answered. The patient agreed with the plan and demonstrated an understanding of the instructions.  Patient advised to call back or seek an in-person evaluation if the symptoms or condition worsens.  Duration of encounter: 30 minutes.  Total time on encounter, as per AMA guidelines included both the face-to-face and non-face-to-face time personally spent by the physician and/or other qualified health care professional(s) on the day of the encounter (includes time in  activities that require the physician or other qualified health care professional and does not include time in activities normally performed by clinical staff). Physician's time may include the following activities when performed: Preparing to see the patient (e.g., pre-charting review of records, searching for previously ordered imaging, lab work, and nerve conduction tests) Review of prior analgesic pharmacotherapies. Reviewing PMP Interpreting ordered tests (e.g., lab work, imaging, nerve conduction tests) Performing post-procedure evaluations, including interpretation of diagnostic procedures Obtaining and/or reviewing separately  obtained history Performing a medically appropriate examination and/or evaluation Counseling and educating the patient/family/caregiver Ordering medications, tests, or procedures Referring and communicating with other health care professionals (when not separately reported) Documenting clinical information in the electronic or other health record Independently interpreting results (not separately reported) and communicating results to the patient/ family/caregiver Care coordination (not separately reported)  Note by: Oswaldo Done, MD Date: 03/30/2023; Time: 2:50 PM

## 2023-03-30 NOTE — Progress Notes (Signed)
 Safety precautions to be maintained throughout the outpatient stay will include: orient to surroundings, keep bed in low position, maintain call bell within reach at all times, provide assistance with transfer out of bed and ambulation.

## 2023-03-31 ENCOUNTER — Telehealth: Payer: Self-pay | Admitting: Cardiovascular Disease

## 2023-03-31 DIAGNOSIS — J301 Allergic rhinitis due to pollen: Secondary | ICD-10-CM | POA: Diagnosis not present

## 2023-03-31 DIAGNOSIS — J3089 Other allergic rhinitis: Secondary | ICD-10-CM | POA: Diagnosis not present

## 2023-03-31 DIAGNOSIS — J3081 Allergic rhinitis due to animal (cat) (dog) hair and dander: Secondary | ICD-10-CM | POA: Diagnosis not present

## 2023-03-31 NOTE — Telephone Encounter (Signed)
   Pre-operative Risk Assessment    Patient Name: Shannon [name]  DOB: 01-04-1966 MRN: [id number]   Date of last office visit: unknown Date of next office visit: unknown   Request for Surgical Clearance    Procedure:   saccccroiliac joint injection with steroids   Date of Surgery:  Clearance TBD                                Surgeon:  Dr. Valda Favia Surgeon's Group or Practice Name:  [facility] Phone number:  [phone] Fax number:  [phone]   Type of Clearance Requested:   - Medical    Type of Anesthesia:  Not Indicated   Additional requests/questions:    SignedShawna Orleans   03/31/2023, 10:23 AM

## 2023-03-31 NOTE — Telephone Encounter (Signed)
   Patient Name: Shannon [name]  DOB: November 29, 1965 MRN: [id number]  Primary Cardiologist: Yvonne Kendall, MD  Chart reviewed as part of pre-operative protocol coverage. Given past medical history and time since last visit, based on ACC/AHA guidelines, Shannon [name] is at acceptable risk for the planned procedure without further cardiovascular testing.   Patient was recently seen in clinic by Carlos Levering, NP on 03/09/2023 and was doing well.  Per Gavin Pound, "Patient was without concerning cardiac symptoms at last visit. No cardiac testing clinically indicated.  She is an acceptable risk for sacroiliac joint injection.  DW"   Regarding ASA therapy, we recommend continuation of ASA throughout the perioperative period.  However, if the surgeon feels that cessation of ASA is required in the perioperative period, it may be stopped 5-7 days prior to surgery with a plan to resume it as soon as felt to be feasible from a surgical standpoint in the post-operative period.   I will route this recommendation to the requesting party via Epic fax function and remove from pre-op pool.  Please call with questions.  Joylene Grapes, NP 03/31/2023, 11:17 AM

## 2023-04-03 ENCOUNTER — Encounter: Payer: Self-pay | Admitting: Pain Medicine

## 2023-04-03 ENCOUNTER — Telehealth: Payer: Self-pay | Admitting: *Deleted

## 2023-04-03 ENCOUNTER — Encounter: Payer: Self-pay | Admitting: Internal Medicine

## 2023-04-03 DIAGNOSIS — Z8719 Personal history of other diseases of the digestive system: Secondary | ICD-10-CM | POA: Diagnosis not present

## 2023-04-03 DIAGNOSIS — I5181 Takotsubo syndrome: Secondary | ICD-10-CM | POA: Diagnosis not present

## 2023-04-03 DIAGNOSIS — K219 Gastro-esophageal reflux disease without esophagitis: Secondary | ICD-10-CM | POA: Diagnosis not present

## 2023-04-03 DIAGNOSIS — K5730 Diverticulosis of large intestine without perforation or abscess without bleeding: Secondary | ICD-10-CM | POA: Diagnosis not present

## 2023-04-03 NOTE — Telephone Encounter (Signed)
 Would recommend scheduling prior to injection.

## 2023-04-07 DIAGNOSIS — J3089 Other allergic rhinitis: Secondary | ICD-10-CM | POA: Diagnosis not present

## 2023-04-07 DIAGNOSIS — J3081 Allergic rhinitis due to animal (cat) (dog) hair and dander: Secondary | ICD-10-CM | POA: Diagnosis not present

## 2023-04-07 DIAGNOSIS — J301 Allergic rhinitis due to pollen: Secondary | ICD-10-CM | POA: Diagnosis not present

## 2023-04-08 ENCOUNTER — Encounter

## 2023-04-08 DIAGNOSIS — G4733 Obstructive sleep apnea (adult) (pediatric): Secondary | ICD-10-CM

## 2023-04-08 DIAGNOSIS — R069 Unspecified abnormalities of breathing: Secondary | ICD-10-CM | POA: Diagnosis not present

## 2023-04-08 DIAGNOSIS — G4730 Sleep apnea, unspecified: Secondary | ICD-10-CM | POA: Diagnosis not present

## 2023-04-09 ENCOUNTER — Other Ambulatory Visit (INDEPENDENT_AMBULATORY_CARE_PROVIDER_SITE_OTHER): Payer: BC Managed Care – PPO

## 2023-04-09 DIAGNOSIS — E041 Nontoxic single thyroid nodule: Secondary | ICD-10-CM

## 2023-04-09 DIAGNOSIS — Z13220 Encounter for screening for lipoid disorders: Secondary | ICD-10-CM

## 2023-04-09 DIAGNOSIS — E0590 Thyrotoxicosis, unspecified without thyrotoxic crisis or storm: Secondary | ICD-10-CM | POA: Diagnosis not present

## 2023-04-09 LAB — CBC WITH DIFFERENTIAL/PLATELET
Basophils Absolute: 0.1 10*3/uL (ref 0.0–0.1)
Basophils Relative: 0.9 % (ref 0.0–3.0)
Eosinophils Absolute: 0.1 10*3/uL (ref 0.0–0.7)
Eosinophils Relative: 1.4 % (ref 0.0–5.0)
HCT: 42.3 % (ref 36.0–46.0)
Hemoglobin: 14.1 g/dL (ref 12.0–15.0)
Lymphocytes Relative: 25.7 % (ref 12.0–46.0)
Lymphs Abs: 1.8 10*3/uL (ref 0.7–4.0)
MCHC: 33.4 g/dL (ref 30.0–36.0)
MCV: 92.5 fl (ref 78.0–100.0)
Monocytes Absolute: 0.4 10*3/uL (ref 0.1–1.0)
Monocytes Relative: 6.6 % (ref 3.0–12.0)
Neutro Abs: 4.5 10*3/uL (ref 1.4–7.7)
Neutrophils Relative %: 65.4 % (ref 43.0–77.0)
Platelets: 254 10*3/uL (ref 150.0–400.0)
RBC: 4.58 Mil/uL (ref 3.87–5.11)
RDW: 14.1 % (ref 11.5–15.5)
WBC: 6.8 10*3/uL (ref 4.0–10.5)

## 2023-04-09 LAB — HEPATIC FUNCTION PANEL
ALT: 20 U/L (ref 0–35)
AST: 18 U/L (ref 0–37)
Albumin: 4.3 g/dL (ref 3.5–5.2)
Alkaline Phosphatase: 60 U/L (ref 39–117)
Bilirubin, Direct: 0.1 mg/dL (ref 0.0–0.3)
Total Bilirubin: 0.4 mg/dL (ref 0.2–1.2)
Total Protein: 6.7 g/dL (ref 6.0–8.3)

## 2023-04-09 LAB — BASIC METABOLIC PANEL
BUN: 13 mg/dL (ref 6–23)
CO2: 29 meq/L (ref 19–32)
Calcium: 9.6 mg/dL (ref 8.4–10.5)
Chloride: 106 meq/L (ref 96–112)
Creatinine, Ser: 0.74 mg/dL (ref 0.40–1.20)
GFR: 88.46 mL/min (ref >60.00)
Glucose, Bld: 90 mg/dL (ref 70–99)
Potassium: 3.8 meq/L (ref 3.5–5.1)
Sodium: 142 meq/L (ref 135–145)

## 2023-04-09 LAB — LIPID PANEL
Cholesterol: 147 mg/dL (ref 0–200)
HDL: 77.5 mg/dL (ref >39.00)
LDL Cholesterol: 51 mg/dL (ref 0–99)
NonHDL: 69.26
Total CHOL/HDL Ratio: 2
Triglycerides: 90 mg/dL (ref 0.0–149.0)
VLDL: 18 mg/dL (ref 0.0–40.0)

## 2023-04-09 NOTE — Addendum Note (Signed)
 Addended by: Jama Flavors on: 04/09/2023 02:24 PM   Modules accepted: Orders

## 2023-04-13 ENCOUNTER — Ambulatory Visit: Payer: BC Managed Care – PPO | Admitting: Internal Medicine

## 2023-04-13 ENCOUNTER — Encounter: Payer: Self-pay | Admitting: Internal Medicine

## 2023-04-13 VITALS — BP 116/70 | HR 70 | Temp 98.3°F | Ht 62.0 in | Wt 125.6 lb

## 2023-04-13 DIAGNOSIS — E041 Nontoxic single thyroid nodule: Secondary | ICD-10-CM

## 2023-04-13 DIAGNOSIS — I5181 Takotsubo syndrome: Secondary | ICD-10-CM

## 2023-04-13 DIAGNOSIS — E0590 Thyrotoxicosis, unspecified without thyrotoxic crisis or storm: Secondary | ICD-10-CM | POA: Diagnosis not present

## 2023-04-13 DIAGNOSIS — K219 Gastro-esophageal reflux disease without esophagitis: Secondary | ICD-10-CM

## 2023-04-13 DIAGNOSIS — E538 Deficiency of other specified B group vitamins: Secondary | ICD-10-CM

## 2023-04-13 DIAGNOSIS — Z13220 Encounter for screening for lipoid disorders: Secondary | ICD-10-CM

## 2023-04-13 DIAGNOSIS — G479 Sleep disorder, unspecified: Secondary | ICD-10-CM

## 2023-04-13 DIAGNOSIS — I2510 Atherosclerotic heart disease of native coronary artery without angina pectoris: Secondary | ICD-10-CM

## 2023-04-13 MED ORDER — CYANOCOBALAMIN 1000 MCG/ML IJ SOLN
1000.0000 ug | Freq: Once | INTRAMUSCULAR | Status: AC
Start: 2023-04-13 — End: 2023-04-13
  Administered 2023-04-13: 1000 ug via INTRAMUSCULAR

## 2023-04-13 NOTE — Progress Notes (Signed)
 Subjective:    Patient ID: Shannon [name], female    DOB: July 26, 1965, 58 y.o.   MRN: [id number]  Patient here for  Chief Complaint  Patient presents with   Medical Management of Chronic Issues    HPI Here for a scheduled follow up. Was evalauted 03/18/23 - difficulty sleeping. Concern regarding sleep apnea. Referred to pulmonary. Evaluated 03/23/23 - recommended HST. Had f/u with GI 04/03/23. Had recent colonoscopy - diverticulosis. Recommended high fiber diet. Reports fatigue. Feels is related to her medications. No chest pain or sob reported. No abdominal pain or bowel change reported.    Past Medical History:  Diagnosis Date   Anemia    Anxiety    Breast pain, right 11/07/2012   Breathing difficulty 06/20/2014   Environmental allergies    GERD (gastroesophageal reflux disease)    Hiatal hernia    small   History of migraine headaches    Hyperthyroidism    s/p ablation   Vaginitis 01/16/2013   Past Surgical History:  Procedure Laterality Date   BREAST BIOPSY Right 12/05/2015   neg   CESAREAN SECTION  11/98   Dr Colletta [place]   CHOLECYSTECTOMY  11/05   Dr Michela Pitcher   JOINT REPLACEMENT Left 2017   hip   LEFT HEART CATH AND CORONARY ANGIOGRAPHY N/A 11/25/2022   Procedure: LEFT HEART CATH AND CORONARY ANGIOGRAPHY;  Surgeon: Yvonne Kendall, MD;  Location: [facility];  Service: Cardiovascular;  Laterality: N/A;   NASAL SINUS SURGERY  11/93   SEPTOPLASTY  5/90   Dr Esmeralda Links   TUBAL LIGATION  02/1998   UMBILICAL HERNIA REPAIR  02/1998   Family History  Problem Relation Age of Onset   Heart disease Father    Hypertension Father    Thyroid disease Father    Hypercholesterolemia Father    Kidney cancer Father    Leukemia Father        hairy cell   Hypercholesterolemia Mother    Thyroid disease Mother    Breast cancer Mother 48   Breast cancer Other        maternal great grandmother   Heart disease Maternal Grandfather        MI - age 47   Prostate cancer Paternal  Grandfather    Pancreatic cancer Paternal Grandfather    Lung cancer Paternal Grandfather    Bladder Cancer Neg Hx    Social History   Socioeconomic History   Marital status: Married    Spouse name: Not on file   Number of children: 1   Years of education: Not on file   Highest education level: 12th grade  Occupational History   Not on file  Tobacco Use   Smoking status: Former    Current packs/day: 0.00    Types: Cigarettes    Quit date: 01/28/2000    Years since quitting: 23.2   Smokeless tobacco: Never  Substance and Sexual Activity   Alcohol use: No    Alcohol/week: 0.0 standard drinks of alcohol   Drug use: No   Sexual activity: Not Currently    Partners: Male  Other Topics Concern   Not on file  Social History Narrative   Not on file   Social Drivers of Health   Financial Resource Strain: Low Risk  (03/17/2023)   Overall Financial Resource Strain (CARDIA)    Difficulty of Paying Living Expenses: Not hard at all  Food Insecurity: No Food Insecurity (03/17/2023)   Hunger Vital Sign  Worried About Programme researcher, broadcasting/film/video in the Last Year: Never true    Ran Out of Food in the Last Year: Never true  Transportation Needs: No Transportation Needs (03/17/2023)   PRAPARE - Administrator, Civil Service (Medical): No    Lack of Transportation (Non-Medical): No  Physical Activity: Sufficiently Active (03/17/2023)   Exercise Vital Sign    Days of Exercise per Week: 5 days    Minutes of Exercise per Session: 40 min  Stress: No Stress Concern Present (03/17/2023)   [employer] of Occupational Health - Occupational Stress Questionnaire    Feeling of Stress : Only a little  Social Connections: Socially Integrated (03/17/2023)   Social Connection and Isolation Panel [NHANES]    Frequency of Communication with Friends and Family: More than three times a week    Frequency of Social Gatherings with Friends and Family: More than three times a week    Attends Religious  Services: More than 4 times per year    Active Member of [employer] or Organizations: Yes    Attends Engineer, structural: More than 4 times per year    Marital Status: Married     Review of Systems  Constitutional:  Positive for fatigue. Negative for appetite change and unexpected weight change.  HENT:  Negative for congestion and sinus pressure.   Respiratory:  Negative for cough, chest tightness and shortness of breath.   Cardiovascular:  Negative for chest pain, palpitations and leg swelling.  Gastrointestinal:  Negative for abdominal pain, diarrhea, nausea and vomiting.  Genitourinary:  Negative for difficulty urinating and dysuria.  Musculoskeletal:  Negative for joint swelling and myalgias.  Skin:  Negative for color change and rash.  Neurological:  Negative for dizziness and headaches.  Psychiatric/Behavioral:  Negative for agitation and dysphoric mood.        Objective:     BP 116/70   Pulse 70   Temp 98.3 F (36.8 C) (Oral)   Ht 5\' 2"  (1.575 m)   Wt 125 lb 9.6 oz (57 kg)   LMP 02/12/2013   SpO2 99%   BMI 22.97 kg/m  Wt Readings from Last 3 Encounters:  04/13/23 125 lb 9.6 oz (57 kg)  03/30/23 120 lb (54.4 kg)  03/23/23 124 lb 9.6 oz (56.5 kg)    Physical Exam Vitals reviewed.  Constitutional:      General: She is not in acute distress.    Appearance: Normal appearance.  HENT:     Head: Normocephalic and atraumatic.     Right Ear: External ear normal.     Left Ear: External ear normal.     Mouth/Throat:     Pharynx: No oropharyngeal exudate or posterior oropharyngeal erythema.  Eyes:     General: No scleral icterus.       Right eye: No discharge.        Left eye: No discharge.     Conjunctiva/sclera: Conjunctivae normal.  Neck:     Thyroid: No thyromegaly.  Cardiovascular:     Rate and Rhythm: Normal rate and regular rhythm.  Pulmonary:     Effort: No respiratory distress.     Breath sounds: Normal breath sounds. No wheezing.  Abdominal:      General: Bowel sounds are normal.     Palpations: Abdomen is soft.     Tenderness: There is no abdominal tenderness.  Musculoskeletal:        General: No swelling or tenderness.     Cervical back:  Neck supple. No tenderness.  Lymphadenopathy:     Cervical: No cervical adenopathy.  Skin:    Findings: No erythema or rash.  Neurological:     Mental Status: She is alert.  Psychiatric:        Mood and Affect: Mood normal.        Behavior: Behavior normal.         Outpatient Encounter Medications as of 04/13/2023  Medication Sig   acetaminophen (TYLENOL) 500 MG tablet Take 1,000 mg by mouth every 6 (six) hours as needed for moderate pain (pain score 4-6) or mild pain (pain score 1-3).   ALPRAZolam (XANAX) 0.25 MG tablet TAKE ONE TABLET DAILY IF NEEDED FOR ANXIETY   aspirin EC 81 MG tablet Take 1 tablet (81 mg total) by mouth daily. Swallow whole.   atorvastatin (LIPITOR) 20 MG tablet Take 1 tablet (20 mg total) by mouth daily.   cholecalciferol (VITAMIN D3) 25 MCG (1000 UNIT) tablet Take 1,000 Units by mouth daily.   Cyanocobalamin 1000 MCG/ML KIT Inject 1,000 mcg as directed every 30 (thirty) days.   empagliflozin (JARDIANCE) 10 MG TABS tablet Take 1 tablet (10 mg total) by mouth daily.   EPINEPHrine 0.3 mg/0.3 mL IJ SOAJ injection Inject 0.3 mg into the muscle as needed for anaphylaxis.   metoprolol succinate (TOPROL XL) 25 MG 24 hr tablet Take 0.5 tablets (12.5 mg total) by mouth daily. At bedtime   nitroGLYCERIN (NITROSTAT) 0.4 MG SL tablet Place 1 tablet (0.4 mg total) under the tongue every 5 (five) minutes as needed for chest pain.   omeprazole (PRILOSEC) 40 MG capsule Take 40 mg by mouth daily.   ondansetron (ZOFRAN-ODT) 4 MG disintegrating tablet Take 1 tablet (4 mg total) by mouth every 6 (six) hours as needed for nausea or vomiting.   [EXPIRED] cyanocobalamin (VITAMIN B12) injection 1,000 mcg    No facility-administered encounter medications on file as of 04/13/2023.      Lab Results  Component Value Date   WBC 6.8 04/09/2023   HGB 14.1 04/09/2023   HCT 42.3 04/09/2023   PLT 254.0 04/09/2023   GLUCOSE 90 04/09/2023   CHOL 147 04/09/2023   TRIG 90.0 04/09/2023   HDL 77.50 04/09/2023   LDLCALC 51 04/09/2023   ALT 20 04/09/2023   AST 18 04/09/2023   NA 142 04/09/2023   K 3.8 04/09/2023   CL 106 04/09/2023   CREATININE 0.74 04/09/2023   BUN 13 04/09/2023   CO2 29 04/09/2023   TSH 1.031 11/24/2022   INR 1.0 11/24/2022    US THYROID Result Date: 01/29/2023 CLINICAL DATA:  Incidental on CT. Thyroid nodule incidentally noted on chest CT performed 11/24/2022 EXAM: THYROID ULTRASOUND TECHNIQUE: Ultrasound examination of the thyroid gland and adjacent soft tissues was performed. COMPARISON:  Chest CT-11/16/2022 FINDINGS: Parenchymal Echotexture: Moderately heterogenous Isthmus: Normal in size measuring 0.2 cm in diameter Right lobe: Normal in size measuring 4.8 x 3.0 x 2.5 cm Left lobe: Normal in size measuring 3.6 x 1.8 x 1.6 cm _________________________________________________________ Estimated total number of nodules >/= 1 cm: 1 Number of spongiform nodules >/=  2 cm not described below (TR1): 0 Number of mixed cystic and solid nodules >/= 1.5 cm not described below (TR2): 0 _________________________________________________________ Nodule # 1: Location: Right; Mid - correlates with the nodule questioned on preceding chest CT Maximum size: 3.8 cm; Other 2 dimensions: 2.4 x 2.4 cm Composition: solid/almost completely solid (2) Echogenicity: isoechoic (1) Shape: not taller-than-wide (0) Margins: ill-defined (0) Echogenic foci:  none (0) ACR TI-RADS total points: 3. ACR TI-RADS risk category: TR3 (3 points). ACR TI-RADS recommendations: **Given size (>/= 2.5 cm) and appearance, fine needle aspiration of this mildly suspicious nodule should be considered based on TI-RADS criteria. _________________________________________________________ Nodule # 2: Location: Left; Mid  Maximum size: 0.9 cm; Other 2 dimensions: 0.9 x 0.9 cm Composition: cannot determine (2) Echogenicity: cannot determine (1) Shape: not taller-than-wide (0) Margins: smooth (0) Echogenic foci: peripheral calcifications (2) ACR TI-RADS total points: 5. ACR TI-RADS risk category: TR4 (4-6 points). ACR TI-RADS recommendations: Given size (<0.9 cm) and appearance, this nodule does NOT meet TI-RADS criteria for biopsy or dedicated follow-up. _________________________________________________________ IMPRESSION: Dominant right-sided thyroid nodule questioned on preceding chest CT, meets imaging criteria to recommend percutaneous sampling as indicated. The above is in keeping with the ACR TI-RADS recommendations - J Am Coll Radiol 2017;14:587-595. Electronically Signed   By: Simonne Come M.D.   On: 01/29/2023 08:57       Assessment & Plan:  B12 deficiency -     Cyanocobalamin  Screening cholesterol level -     Lipid panel; Future -     Hepatic function panel; Future  Hyperthyroidism Assessment & Plan: S/p ablation. Follow tsh.   Orders: -     Basic metabolic panel; Future -     CBC with Differential/Platelet; Future  Thyroid nodule -     CBC with Differential/Platelet; Future  Stress-induced cardiomyopathy Assessment & Plan: Continues on aspirin. Taking jardiance. Continue on metoprolol - taking in the evening. ECHO - EF 45-50%, Grade I DD. Breathing stable. Reports increased fatigue. Feel is related to her medication. Recent HST. F/u with pulmonary.    Sleep difficulties Assessment & Plan: Pulmonary evaluation - HST.    Gastroesophageal reflux disease, unspecified whether esophagitis present Assessment & Plan: Continues on nexium. No increased acid reflux.    Coronary artery disease involving native coronary artery of native heart without angina pectoris Assessment & Plan: Cardiac cath 11/25/22 - Mild to moderate, nonobstructive coronary artery disease with 30-40% mid LAD stenosis.  No  significant disease noted in the LMCA, LCx, or RCA.. Mildly reduced left ventricular systolic function with mid left ventricular wall motion abnormality most consistent with a Takotsubo variant. Normal left ventricular filling pressure.Continues on aspirin. Taking jardiance.  Has not started atorvastatin. Plans to start. Instructed to take toprol 12.5mg  in the evening. ECHO - EF 45-50%, GRADEI DD. Saw cardiology. Metoprolol time taking adjusted. Follow. Overall stable. Does report fatigue. HST - f/u pulmonary.       Dale [place]ana, MD

## 2023-04-13 NOTE — Progress Notes (Signed)
 Pt presented for their vitamin B12 injection. Pt was identified through two identifiers. Pt tolerated shot well in their right deltoid.

## 2023-04-14 DIAGNOSIS — J3081 Allergic rhinitis due to animal (cat) (dog) hair and dander: Secondary | ICD-10-CM | POA: Diagnosis not present

## 2023-04-14 DIAGNOSIS — J301 Allergic rhinitis due to pollen: Secondary | ICD-10-CM | POA: Diagnosis not present

## 2023-04-14 DIAGNOSIS — J3089 Other allergic rhinitis: Secondary | ICD-10-CM | POA: Diagnosis not present

## 2023-04-19 ENCOUNTER — Encounter: Payer: Self-pay | Admitting: Internal Medicine

## 2023-04-19 NOTE — Assessment & Plan Note (Signed)
 Continues on aspirin. Taking jardiance. Continue on metoprolol - taking in the evening. ECHO - EF 45-50%, Grade I DD. Breathing stable. Reports increased fatigue. Feel is related to her medication. Recent HST. F/u with pulmonary.

## 2023-04-19 NOTE — Assessment & Plan Note (Signed)
 Pulmonary evaluation - HST.

## 2023-04-19 NOTE — Assessment & Plan Note (Signed)
 Continues on nexium. No increased acid reflux.

## 2023-04-19 NOTE — Assessment & Plan Note (Signed)
 Cardiac cath 11/25/22 - Mild to moderate, nonobstructive coronary artery disease with 30-40% mid LAD stenosis.  No significant disease noted in the LMCA, LCx, or RCA.. Mildly reduced left ventricular systolic function with mid left ventricular wall motion abnormality most consistent with a Takotsubo variant. Normal left ventricular filling pressure.Continues on aspirin. Taking jardiance.  Has not started atorvastatin. Plans to start. Instructed to take toprol 12.5mg  in the evening. ECHO - EF 45-50%, GRADEI DD. Saw cardiology. Metoprolol time taking adjusted. Follow. Overall stable. Does report fatigue. HST - f/u pulmonary.

## 2023-04-19 NOTE — Assessment & Plan Note (Signed)
S/p ablation.  Follow tsh.  

## 2023-04-21 ENCOUNTER — Emergency Department

## 2023-04-21 ENCOUNTER — Emergency Department
Admission: EM | Admit: 2023-04-21 | Discharge: 2023-04-21 | Disposition: A | Discharge status: Home / Self Care | Attending: Emergency Medicine | Admitting: Emergency Medicine

## 2023-04-21 ENCOUNTER — Other Ambulatory Visit: Payer: Self-pay

## 2023-04-21 ENCOUNTER — Ambulatory Visit (HOSPITAL_BASED_OUTPATIENT_CLINIC_OR_DEPARTMENT_OTHER): Admitting: Pain Medicine

## 2023-04-21 ENCOUNTER — Encounter: Payer: Self-pay | Admitting: Emergency Medicine

## 2023-04-21 DIAGNOSIS — R1032 Left lower quadrant pain: Secondary | ICD-10-CM

## 2023-04-21 DIAGNOSIS — G8929 Other chronic pain: Secondary | ICD-10-CM

## 2023-04-21 DIAGNOSIS — K5732 Diverticulitis of large intestine without perforation or abscess without bleeding: Secondary | ICD-10-CM | POA: Insufficient documentation

## 2023-04-21 DIAGNOSIS — Z91199 Patient's noncompliance with other medical treatment and regimen due to unspecified reason: Secondary | ICD-10-CM

## 2023-04-21 DIAGNOSIS — Z96642 Presence of left artificial hip joint: Secondary | ICD-10-CM | POA: Diagnosis not present

## 2023-04-21 DIAGNOSIS — K5792 Diverticulitis of intestine, part unspecified, without perforation or abscess without bleeding: Secondary | ICD-10-CM | POA: Diagnosis not present

## 2023-04-21 DIAGNOSIS — Z7982 Long term (current) use of aspirin: Secondary | ICD-10-CM | POA: Diagnosis not present

## 2023-04-21 LAB — CBC WITH DIFFERENTIAL/PLATELET
Abs Immature Granulocytes: 0.04 10*3/uL (ref 0.00–0.07)
Basophils Absolute: 0.1 10*3/uL (ref 0.0–0.1)
Basophils Relative: 1 %
Eosinophils Absolute: 0.1 10*3/uL (ref 0.0–0.5)
Eosinophils Relative: 2 %
HCT: 42.7 % (ref 36.0–46.0)
Hemoglobin: 14.2 g/dL (ref 12.0–15.0)
Immature Granulocytes: 0 %
Lymphocytes Relative: 19 %
Lymphs Abs: 1.7 10*3/uL (ref 0.7–4.0)
MCH: 31 pg (ref 26.0–34.0)
MCHC: 33.3 g/dL (ref 30.0–36.0)
MCV: 93.2 fL (ref 80.0–100.0)
Monocytes Absolute: 0.7 10*3/uL (ref 0.1–1.0)
Monocytes Relative: 8 %
Neutro Abs: 6.5 10*3/uL (ref 1.7–7.7)
Neutrophils Relative %: 70 %
Platelets: 237 10*3/uL (ref 150–400)
RBC: 4.58 MIL/uL (ref 3.87–5.11)
RDW: 12.7 % (ref 11.5–15.5)
WBC: 9.1 10*3/uL (ref 4.0–10.5)
nRBC: 0 % (ref 0.0–0.2)

## 2023-04-21 LAB — URINALYSIS, ROUTINE W REFLEX MICROSCOPIC
Bacteria, UA: NONE SEEN
Bilirubin Urine: NEGATIVE
Glucose, UA: >=500 mg/dL — AB
Ketones, ur: NEGATIVE mg/dL
Leukocytes,Ua: NEGATIVE
Nitrite: NEGATIVE
Protein, ur: NEGATIVE mg/dL
Specific Gravity, Urine: 1.017 (ref 1.005–1.030)
pH: 5 (ref 5.0–8.0)

## 2023-04-21 LAB — LIPASE, BLOOD: Lipase: 21 U/L (ref 11–51)

## 2023-04-21 LAB — COMPREHENSIVE METABOLIC PANEL
ALT: 18 U/L (ref 0–44)
AST: 18 U/L (ref 15–41)
Albumin: 3.8 g/dL (ref 3.5–5.0)
Alkaline Phosphatase: 60 U/L (ref 38–126)
Anion gap: 8 (ref 5–15)
BUN: 10 mg/dL (ref 6–20)
CO2: 26 mmol/L (ref 22–32)
Calcium: 9.4 mg/dL (ref 8.9–10.3)
Chloride: 104 mmol/L (ref 98–111)
Creatinine, Ser: 0.66 mg/dL (ref 0.44–1.00)
GFR, Estimated: >60 mL/min (ref >60)
Glucose, Bld: 106 mg/dL — ABNORMAL HIGH (ref 70–99)
Potassium: 3.7 mmol/L (ref 3.5–5.1)
Sodium: 138 mmol/L (ref 135–145)
Total Bilirubin: 0.7 mg/dL (ref 0.0–1.2)
Total Protein: 6.8 g/dL (ref 6.5–8.1)

## 2023-04-21 MED ORDER — ONDANSETRON 4 MG PO TBDP: 4.0000 mg | ORAL_TABLET | Freq: Three times a day (TID) | ORAL | 0 refills | Status: DC | PRN

## 2023-04-21 MED ORDER — METOCLOPRAMIDE HCL 5 MG/ML IJ SOLN
5.0000 mg | Freq: Once | INTRAMUSCULAR | Status: AC
Start: 2023-04-21 — End: 2023-04-21
  Administered 2023-04-21: 5 mg via INTRAVENOUS
  Filled 2023-04-21: qty 2

## 2023-04-21 MED ORDER — OXYCODONE-ACETAMINOPHEN 5-325 MG PO TABS: 1.0000 | ORAL_TABLET | ORAL | 0 refills | Status: DC | PRN

## 2023-04-21 MED ORDER — AMOXICILLIN-POT CLAVULANATE 875-125 MG PO TABS: 1.0000 | ORAL_TABLET | Freq: Two times a day (BID) | ORAL | 0 refills | Status: DC

## 2023-04-21 MED ORDER — KETOROLAC TROMETHAMINE 30 MG/ML IJ SOLN
15.0000 mg | Freq: Once | INTRAMUSCULAR | Status: AC
  Administered 2023-04-21: 15 mg via INTRAVENOUS
  Filled 2023-04-21: qty 1

## 2023-04-21 MED ORDER — IOHEXOL 300 MG/ML  SOLN
100.0000 mL | Freq: Once | INTRAMUSCULAR | Status: AC | PRN
  Administered 2023-04-21: 100 mL via INTRAVENOUS

## 2023-04-21 MED ORDER — PIPERACILLIN-TAZOBACTAM 3.375 G IVPB 30 MIN
3.3750 g | Freq: Once | INTRAVENOUS | Status: AC
  Administered 2023-04-21: 3.375 g via INTRAVENOUS
  Filled 2023-04-21 (×2): qty 50

## 2023-04-21 MED ORDER — SODIUM CHLORIDE 0.9 % IV BOLUS
500.0000 mL | Freq: Once | INTRAVENOUS | Status: AC
  Administered 2023-04-21: 500 mL via INTRAVENOUS

## 2023-04-21 NOTE — Patient Instructions (Signed)

## 2023-04-21 NOTE — ED Triage Notes (Signed)
 Patient ambulatory to triage with steady gait, without difficulty or distress noted; pt reports for 1-2 days having left flank/lower abd pain accomp by nausea; st hx diverticulitis

## 2023-04-21 NOTE — Progress Notes (Signed)
(  04/21/2023) patient's husband called to reschedule secondary to patient being in the hospital with severe pain in the left side.

## 2023-04-21 NOTE — ED Notes (Signed)
 Husband, Jonny Ruiz called for an update. He states he's on his way

## 2023-04-21 NOTE — ED Provider Notes (Signed)
 [facility] Provider Note    Event Date/Time   First MD Initiated Contact with Patient 04/21/23 0503     (approximate)   History   Abdominal Pain   HPI  Shannon [name] is a 58 y.o. female who presents to the ED from home with a chief complaint of left lower abdominal pain radiating to her left flank for the past 2 days.  Endorses associated nausea.  History of diverticulitis.  Denies associated fever/chills, chest pain, shortness of breath, vomiting, dysuria or diarrhea.     Past Medical History   Past Medical History:  Diagnosis Date   Anemia    Anxiety    Breast pain, right 11/07/2012   Breathing difficulty 06/20/2014   Environmental allergies    GERD (gastroesophageal reflux disease)    Hiatal hernia    small   History of migraine headaches    Hyperthyroidism    s/p ablation   Vaginitis 01/16/2013     Active Problem List   Patient Active Problem List   Diagnosis Date Noted   Chronic sacroiliac joint pain (Bilateral) 03/30/2023   Thyroid nodule 01/08/2023   Lower abdominal pain 12/14/2022   CAD (coronary artery disease) 11/26/2022   Mixed hyperlipidemia 11/26/2022   Stress-induced cardiomyopathy 11/25/2022   QT prolongation 11/25/2022   Non-ST elevation (NSTEMI) myocardial infarction (HCC) 11/24/2022   Carpal tunnel syndrome of left wrist 11/18/2022   Primary osteoarthritis of first metatarsophalangeal (MTP) joint 06/06/2022   Chronic low back pain (1ry area of Pain) (Left) w/o sciatica 03/25/2022   Nipple problem 03/18/2022   Chest pain 11/05/2021   Herpes zoster 09/17/2021   Ear fullness 08/19/2021   Greater trochanteric bursitis (Right) 04/25/2021   Bursitis of hip (Right) 04/25/2021   Osteoarthritis of hip (Right) 04/09/2021   Impaired range of motion of hip (Right) 04/09/2021   Allergic rhinitis 04/03/2021   Allergic rhinitis due to animal (cat) (dog) hair and dander 04/03/2021   Allergic rhinitis due to pollen 04/03/2021    Chronic allergic conjunctivitis 04/03/2021   Cough 04/03/2021   Idiopathic urticaria 04/03/2021   Rash and other nonspecific skin eruption 04/03/2021   Chronic hip pain (Right) 04/03/2021   Somatic dysfunction of sacroiliac joint (Right) 04/03/2021   Sacroiliac joint dysfunction (Right) 04/03/2021   Breast pain 10/11/2020   Sleep difficulties 10/07/2020   Temporomandibular joint (TMJ) pain 04/14/2020   Neck pain 04/14/2020   Vaginal discomfort 03/31/2020   Family history of cancer 03/31/2020   Osteopenia 09/25/2019   DDD (degenerative disc disease), cervical (C5-6, C6-7) 09/05/2019   Grade 1 Anterolisthesis of C5/C6 09/05/2019   Cervical facet arthropathy (Bilateral) (L>R) 09/05/2019   Cervical facet syndrome (Bilateral) 09/05/2019   Cervicalgia 08/22/2019   Cervical myofascial pain syndrome (Left) 08/22/2019   Occipital neuralgia (Left) 08/22/2019   Nipple tenderness 07/03/2019   Itching 04/24/2019   Right calf pain 03/13/2019   Axillary tenderness, right 03/13/2019   Urinary frequency 12/26/2018   Cerumen impaction 09/11/2018   Preoperative testing 07/05/2018   Vaginal discharge 05/30/2018   DDD (degenerative disc disease), lumbosacral 04/13/2018   Vitamin D insufficiency 11/17/2017   Osteoarthritis of great toe joint (Right) 10/15/2017   Chronic toe pain (Right) 10/05/2017   Great toe pain (Right) 10/05/2017   Disorder of skeletal system 10/05/2017   Pharmacologic therapy 10/05/2017   Problems influencing health status 10/05/2017   Lumbar Facet syndrome (Bilateral) (R>L) 07/20/2017   Spondylosis without myelopathy or radiculopathy, lumbosacral region 07/20/2017   Urinary urgency  06/28/2017   Chronic pain syndrome 06/15/2017   Other specified dorsopathies, sacral and sacrococcygeal region 06/15/2017   Chronic myofascial pain syndrome (Right Gluteous Muscle) 03/03/2016   Chronic sacroiliac joint pain (Right) 01/14/2016   History of hip replacement (Left) 11/12/2015    Osteoarthritis of sacroiliac joint (Bilateral) (R>L) 07/12/2015   Axillary lump 05/22/2015   Post-menopausal bleeding 02/20/2015   Chronic low back pain (1ry area of Pain) (Bilateral) (R>L) w/o sciatica 12/16/2014   Lumbar lateral recess stenosis (L4-5) (Left) 12/16/2014   Lumbar facet hypertrophy 12/16/2014   Chronic lumbar radicular pain (intermittent) (L4 Dermatome) (Left) 12/16/2014   Chronic hip pain (resolved after hip replacement) (Left) 12/16/2014   Nicotine dependence 12/16/2014   Tobacco abuse 12/16/2014   Lumbar spondylosis 12/16/2014   Encounter for chronic pain management 12/16/2014   Epigastric pain 10/19/2014   Gastroesophageal reflux disease without esophagitis 10/18/2014   Anxiety 06/20/2014   Health care maintenance 04/02/2014   Menopausal symptoms 11/07/2012   Migraine 05/21/2012   Anemia 02/01/2012   Hyperthyroidism 01/30/2012   GERD (gastroesophageal reflux disease) 01/30/2012     Past Surgical History   Past Surgical History:  Procedure Laterality Date   BREAST BIOPSY Right 12/05/2015   neg   CESAREAN SECTION  11/98   Dr Colletta [place]   CHOLECYSTECTOMY  11/05   Dr Michela Pitcher   JOINT REPLACEMENT Left 2017   hip   LEFT HEART CATH AND CORONARY ANGIOGRAPHY N/A 11/25/2022   Procedure: LEFT HEART CATH AND CORONARY ANGIOGRAPHY;  Surgeon: Yvonne Kendall, MD;  Location: [facility] INV[facility];  Service: Cardiovascular;  Laterality: N/A;   NASAL SINUS SURGERY  11/93   SEPTOPLASTY  5/90   Dr Esmeralda Links   TUBAL LIGATION  02/1998   UMBILICAL HERNIA REPAIR  02/1998     Home Medications   Prior to Admission medications   Medication Sig Start Date End Date Taking? Authorizing Provider  amoxicillin-clavulanate (AUGMENTIN) 875-125 MG tablet Take 1 tablet by mouth 2 (two) times daily. 04/21/23  Yes Irean Hong, MD  ondansetron (ZOFRAN-ODT) 4 MG disintegrating tablet Take 1 tablet (4 mg total) by mouth every 8 (eight) hours as needed for nausea or vomiting. 04/21/23  Yes Irean Hong, MD  oxyCODONE-acetaminophen (PERCOCET/ROXICET) 5-325 MG tablet Take 1 tablet by mouth every 4 (four) hours as needed for severe pain (pain score 7-10). 04/21/23  Yes Irean Hong, MD  acetaminophen (TYLENOL) 500 MG tablet Take 1,000 mg by mouth every 6 (six) hours as needed for moderate pain (pain score 4-6) or mild pain (pain score 1-3).    [provider]  ALPRAZolam Prudy Feeler) 0.25 MG tablet TAKE ONE TABLET DAILY IF NEEDED FOR ANXIETY 01/08/23   Dale Price, MD  aspirin EC 81 MG tablet Take 1 tablet (81 mg total) by mouth daily. Swallow whole. 11/27/22   Arnetha Courser, MD  atorvastatin (LIPITOR) 20 MG tablet Take 1 tablet (20 mg total) by mouth daily. 11/27/22   Arnetha Courser, MD  cholecalciferol (VITAMIN D3) 25 MCG (1000 UNIT) tablet Take 1,000 Units by mouth daily.    [provider]  Cyanocobalamin 1000 MCG/ML KIT Inject 1,000 mcg as directed every 30 (thirty) days.    [provider]  empagliflozin (JARDIANCE) 10 MG TABS tablet Take 1 tablet (10 mg total) by mouth daily. 11/26/22   Arnetha Courser, MD  EPINEPHrine 0.3 mg/0.3 mL IJ SOAJ injection Inject 0.3 mg into the muscle as needed for anaphylaxis.    [provider]  metoprolol  succinate (TOPROL XL) 25 MG 24 hr tablet Take 0.5 tablets (12.5 mg total) by mouth daily. At bedtime 12/05/22   Carlos Levering, NP  nitroGLYCERIN (NITROSTAT) 0.4 MG SL tablet Place 1 tablet (0.4 mg total) under the tongue every 5 (five) minutes as needed for chest pain. 11/26/22   Antonieta Iba, MD  omeprazole (PRILOSEC) 40 MG capsule Take 40 mg by mouth daily.    [provider]     Allergies  Codeine, Decongest-aid [pseudoephedrine], Flagyl [metronidazole], Misc. sulfonamide containing compounds, and Sulfa antibiotics   Family History   Family History  Problem Relation Age of Onset   Heart disease Father    Hypertension Father    Thyroid disease Father    Hypercholesterolemia Father    Kidney  cancer Father    Leukemia Father        hairy cell   Hypercholesterolemia Mother    Thyroid disease Mother    Breast cancer Mother 35   Breast cancer Other        maternal great grandmother   Heart disease Maternal Grandfather        MI - age 63   Prostate cancer Paternal Grandfather    Pancreatic cancer Paternal Grandfather    Lung cancer Paternal Grandfather    Bladder Cancer Neg Hx      Physical Exam  Triage Vital Signs: ED Triage Vitals  Encounter Vitals Group     BP 04/21/23 0351 111/71     Systolic BP Percentile --      Diastolic BP Percentile --      Pulse Rate 04/21/23 0351 81     Resp 04/21/23 0351 20     Temp 04/21/23 0351 98.1 F (36.7 C)     Temp Source 04/21/23 0351 Oral     SpO2 04/21/23 0351 98 %     Weight 04/21/23 0348 122 lb (55.3 kg)     Height 04/21/23 0348 5\' 2"  (1.575 m)     Head Circumference --      Peak Flow --      Pain Score 04/21/23 0348 4     Pain Loc --      Pain Education --      Exclude from Growth Chart --     Updated Vital Signs: BP 111/71 (BP Location: Right Arm)   Pulse 81   Temp 98.1 F (36.7 C) (Oral)   Resp 20   Ht 5\' 2"  (1.575 m)   Wt 55.3 kg   LMP 02/12/2013   SpO2 98%   BMI 22.31 kg/m    General: Awake, mild distress.  CV:  RRR.  Good peripheral perfusion.  Resp:  Normal effort.  CTAB. Abd:  Mild left lower quadrant tenderness to palpation without rebound or guarding.  No distention.  Other:  No truncal vesicles.   ED Results / Procedures / Treatments  Labs (all labs ordered are listed, but only abnormal results are displayed) Labs Reviewed  COMPREHENSIVE METABOLIC PANEL - Abnormal; Notable for the following components:      Result Value   Glucose, Bld 106 (*)    All other components within normal limits  URINALYSIS, ROUTINE W REFLEX MICROSCOPIC - Abnormal; Notable for the following components:   Color, Urine YELLOW (*)    APPearance CLEAR (*)    Glucose, UA >=500 (*)    Hgb urine dipstick SMALL (*)     All other components within normal limits  CBC WITH DIFFERENTIAL/PLATELET  LIPASE, BLOOD  EKG  None   RADIOLOGY I have independently visualized and interpreted patient's imaging study as well as noted the radiology interpretation:  CT ab/pelvis: Mild sigmoid diverticulitis  Official radiology report(s): CT ABDOMEN PELVIS W CONTRAST Result Date: 04/21/2023 CLINICAL DATA:  Left lower quadrant abdominal pain EXAM: CT ABDOMEN AND PELVIS WITH CONTRAST TECHNIQUE: Multidetector CT imaging of the abdomen and pelvis was performed using the standard protocol following bolus administration of intravenous contrast. RADIATION DOSE REDUCTION: This exam was performed according to the departmental dose-optimization program which includes automated exposure control, adjustment of the mA and/or kV according to patient size and/or use of iterative reconstruction technique. CONTRAST:  OMNIPAQUE IOHEXOL 300 MG/ML  SOLN COMPARISON:  11/24/2022 FINDINGS: Lower chest:  No contributory findings. Hepatobiliary: No focal liver abnormality.Cholecystectomy. No biliary dilatation. Pancreas: Unremarkable. Spleen: Unremarkable. Adrenals/Urinary Tract: Negative adrenals. No hydronephrosis or stone. Unremarkable bladder. Stomach/Bowel: Distal colonic diverticulosis with mild fat inflammation around a thickened sigmoid diverticulum. Adjacent submucosal edema in the colonic wall. No bowel obstruction or perforation. Negative appendix. Vascular/Lymphatic: Extensive atheromatous calcification of the distal aorta and branch vessels. No mass or adenopathy. Collateralization of left gonadal veins. Reproductive:Dystrophic calcification in the rectovaginal recess with stalk emanating from the posterior uterine body, consistent with calcified fibroid, unchanged since at least 2019. Other: No ascites or pneumoperitoneum. Musculoskeletal: No acute abnormalities.  Left hip replacement. IMPRESSION: Mild sigmoid diverticulitis.  Electronically Signed   By: Tiburcio Pea M.D.   On: 04/21/2023 05:48     PROCEDURES:  Critical Care performed: No  Procedures   MEDICATIONS ORDERED IN ED: Medications  piperacillin-tazobactam (ZOSYN) IVPB 3.375 g (has no administration in time range)  ketorolac (TORADOL) 30 MG/ML injection 15 mg (has no administration in time range)  iohexol (OMNIPAQUE) 300 MG/ML solution 100 mL (100 mLs Intravenous Contrast Given 04/21/23 0435)  sodium chloride 0.9 % bolus 500 mL (500 mLs Intravenous New Bag/Given 04/21/23 0522)  metoCLOPramide (REGLAN) injection 5 mg (5 mg Intravenous Given 04/21/23 0524)     IMPRESSION / MDM / ASSESSMENT AND PLAN / ED COURSE  I reviewed the triage vital signs and the nursing notes.                             58 year old female presenting with left lower quadrant abdominal pain. Differential diagnosis includes, but is not limited to, ovarian cyst, ovarian torsion, acute appendicitis, diverticulitis, urinary tract infection/pyelonephritis, endometriosis, bowel obstruction, colitis, renal colic, gastroenteritis, hernia, fibroids, endometriosis, etc. I personally reviewed patient's records and noted a PCP office visit on 04/13/2023 for follow-up B12 deficiency, hypothyroidism, stress-induced cardiomyopathy, GERD, CAD.  Patient's presentation is most consistent with acute complicated illness / injury requiring diagnostic workup.  Laboratory results demonstrate normal WBC 9.1, unremarkable electrolytes, UA is negative for infection.  Patient is pending CT abdomen/pelvis.  Administer IV fluids, IV Reglan and reassess.  Clinical Course as of 04/21/23 0602  Tue Apr 21, 2023  0551 CT scan demonstrates mild sigmoid diverticulitis.  Administer IV Zosyn now and will discharge home on antibiotics, as needed pain and nausea medicine and patient will follow-up closely with her PCP.  Strict return precautions given.  Patient verbalizes understanding and agrees with plan of care.  [JS]    Clinical Course User Index [JS] Irean Hong, MD     FINAL CLINICAL IMPRESSION(S) / ED DIAGNOSES   Final diagnoses:  Left lower quadrant abdominal pain  Diverticulitis     Rx / DC Orders  ED Discharge Orders          Ordered    amoxicillin-clavulanate (AUGMENTIN) 875-125 MG tablet  2 times daily        04/21/23 0553    oxyCODONE-acetaminophen (PERCOCET/ROXICET) 5-325 MG tablet  Every 4 hours PRN        04/21/23 0553    ondansetron (ZOFRAN-ODT) 4 MG disintegrating tablet  Every 8 hours PRN        04/21/23 0553             Note:  This document was prepared using Dragon voice recognition software and may include unintentional dictation errors.   Irean Hong, MD 04/21/23 [phone]

## 2023-04-21 NOTE — Discharge Instructions (Signed)
 Take and finish antibiotic as prescribed.  You may take pain and nausea medicine as needed.  Return to the ER for worsening symptoms, persistent vomiting, fever or other concerns.

## 2023-04-22 ENCOUNTER — Encounter: Payer: Self-pay | Admitting: Internal Medicine

## 2023-04-22 NOTE — Telephone Encounter (Signed)
 Called patient. Doing ok. Nothing needed at this time.

## 2023-04-22 NOTE — Telephone Encounter (Signed)
 Please call and see how she is doing from her ER visit. Does she need anything?  Keep [place] posted on the lipitor.

## 2023-04-23 DIAGNOSIS — J3089 Other allergic rhinitis: Secondary | ICD-10-CM | POA: Diagnosis not present

## 2023-04-23 DIAGNOSIS — J301 Allergic rhinitis due to pollen: Secondary | ICD-10-CM | POA: Diagnosis not present

## 2023-04-23 DIAGNOSIS — J3081 Allergic rhinitis due to animal (cat) (dog) hair and dander: Secondary | ICD-10-CM | POA: Diagnosis not present

## 2023-04-24 ENCOUNTER — Other Ambulatory Visit: Payer: Self-pay | Admitting: Internal Medicine

## 2023-04-28 DIAGNOSIS — J3081 Allergic rhinitis due to animal (cat) (dog) hair and dander: Secondary | ICD-10-CM | POA: Diagnosis not present

## 2023-04-28 DIAGNOSIS — J301 Allergic rhinitis due to pollen: Secondary | ICD-10-CM | POA: Diagnosis not present

## 2023-04-28 DIAGNOSIS — J3089 Other allergic rhinitis: Secondary | ICD-10-CM | POA: Diagnosis not present

## 2023-05-04 NOTE — Patient Instructions (Signed)

## 2023-05-04 NOTE — Progress Notes (Unsigned)
 PROVIDER NOTE: Interpretation of information contained herein should be left to medically-trained personnel. Specific patient instructions are provided elsewhere under "Patient Instructions" section of medical record. This document was created in part using STT-dictation technology, any transcriptional errors that may result from this process are unintentional.  Patient: Shannon [name] Type: Established DOB: [birth year] MRN: [id number] PCP: Dale Sehili, MD  Service: Procedure DOS: 05/05/2023 Setting: Ambulatory Location: Ambulatory outpatient facility Delivery: Face-to-face Provider: Oswaldo Done, MD Specialty: Interventional Pain Management Specialty designation: 09 Location: Outpatient facility Ref. Prov.: Dale , MD       Interventional Therapy   Procedure: Sacroiliac Joint Steroid Injection R6L3     Laterality: Bilateral     Level: PSIS (Posterior Superior Iliac Spine)  Imaging: Fluoroscopic guidance Anesthesia: Local anesthesia (1-2% Lidocaine) Anxiolysis: None                 Sedation: No Sedation                       DOS: 05/05/2023  Performed by: Oswaldo Done, MD  Purpose: Diagnostic/Therapeutic Indications: Sacroiliac joint pain in the lower back and hip area severe enough to impact quality of life or function. Rationale (medical necessity): procedure needed and proper for the diagnosis and/or treatment of Shannon [name]'s medical symptoms and needs. 1. Chronic low back pain (1ry area of Pain) (Bilateral) (R>L) w/o sciatica   2. Chronic sacroiliac joint pain (Bilateral)   3. Osteoarthritis of sacroiliac joint (Bilateral) (R>L)   4. Other specified dorsopathies, sacral and sacrococcygeal region   5. Sacroiliac joint dysfunction (Right)   6. Somatic dysfunction of sacroiliac joint (Right)    NAS-11 Pain score:   Pre-procedure: 3 /10   Post-procedure: 0-No pain/10     Target: Interarticular sacroiliac joint. Location: Medial to the postero-medial  edge of iliac spine. Region: Lumbosacral-sacrococcygeal. Approach: Inferior postero-medial percutaneous approach. Type of procedure: Percutaneous joint injection.  Position / Prep / Materials:  Position: Prone  Prep solution: ChloraPrep (2% chlorhexidine gluconate and 70% isopropyl alcohol) Prep Area: Entire posterior lumbosacral area  Materials:  Tray: Block Needle(s):  Type: Spinal  Gauge (G): 22  Length: 5-in Qty: 2  H&P (Pre-op Assessment):  Shannon [name] is a 58 y.o. (year old), female patient, seen today for interventional treatment. She  has a past surgical history that includes Septoplasty (5/90); Nasal sinus surgery (11/93); Cesarean section (11/98); Tubal ligation (02/1998); Umbilical hernia repair (02/1998); Cholecystectomy (11/05); Joint replacement (Left, 2017); Breast biopsy (Right, 12/05/2015); and LEFT HEART CATH AND CORONARY ANGIOGRAPHY (N/A, 11/25/2022). Shannon [name] has a current medication list which includes the following prescription(s): acetaminophen, alprazolam, aspirin ec, atorvastatin, cholecalciferol, cyanocobalamin, epinephrine, jardiance, metoprolol succinate, nitroglycerin, omeprazole, ondansetron, oxycodone-acetaminophen, ubrelvy, and amoxicillin-clavulanate, and the following Facility-Administered Medications: midazolam. Her primarily concern today is the Back Pain  Initial Vital Signs:  Pulse/HCG Rate: 60ECG Heart Rate: 61 Temp: 97.9 F (36.6 C) Resp: 14 BP: 119/64 SpO2: 99 %  BMI: Estimated body mass index is 21.95 kg/m as calculated from the following:   Height as of this encounter: 5\' 2"  (1.575 m).   Weight as of this encounter: 120 lb (54.4 kg).  Risk Assessment: Allergies: Reviewed. She is allergic to codeine, decongest-aid [pseudoephedrine], flagyl [metronidazole], misc. sulfonamide containing compounds, and sulfa antibiotics.  Allergy Precautions: None required Coagulopathies: Reviewed. None identified.  Blood-thinner therapy: None at this  time Active Infection(s): Reviewed. None identified. Shannon [name] is afebrile  Site Confirmation: Shannon [name] was asked to confirm the  procedure and laterality before marking the site Procedure checklist: Completed Consent: Before the procedure and under the influence of no sedative(s), amnesic(s), or anxiolytics, the patient was informed of the treatment options, risks and possible complications. To fulfill our ethical and legal obligations, as recommended by the American Medical Association's Code of Ethics, I have informed the patient of my clinical impression; the nature and purpose of the treatment or procedure; the risks, benefits, and possible complications of the intervention; the alternatives, including doing nothing; the risk(s) and benefit(s) of the alternative treatment(s) or procedure(s); and the risk(s) and benefit(s) of doing nothing. The patient was provided information about the general risks and possible complications associated with the procedure. These may include, but are not limited to: failure to achieve desired goals, infection, bleeding, organ or nerve damage, allergic reactions, paralysis, and death. In addition, the patient was informed of those risks and complications associated to the procedure, such as failure to decrease pain; infection; bleeding; organ or nerve damage with subsequent damage to sensory, motor, and/or autonomic systems, resulting in permanent pain, numbness, and/or weakness of one or several areas of the body; allergic reactions; (i.e.: anaphylactic reaction); and/or death. Furthermore, the patient was informed of those risks and complications associated with the medications. These include, but are not limited to: allergic reactions (i.e.: anaphylactic or anaphylactoid reaction(s)); adrenal axis suppression; blood sugar elevation that in diabetics may result in ketoacidosis or comma; water retention that in patients with history of congestive heart failure may result  in shortness of breath, pulmonary edema, and decompensation with resultant heart failure; weight gain; swelling or edema; medication-induced neural toxicity; particulate matter embolism and blood vessel occlusion with resultant organ, and/or nervous system infarction; and/or aseptic necrosis of one or more joints. Finally, the patient was informed that Medicine is not an exact science; therefore, there is also the possibility of unforeseen or unpredictable risks and/or possible complications that may result in a catastrophic outcome. The patient indicated having understood very clearly. We have given the patient no guarantees and we have made no promises. Enough time was given to the patient to ask questions, all of which were answered to the patient's satisfaction. Ms. Towe has indicated that she wanted to continue with the procedure. Attestation: I, the ordering provider, attest that I have discussed with the patient the benefits, risks, side-effects, alternatives, likelihood of achieving goals, and potential problems during recovery for the procedure that I have provided informed consent. Date  Time: 05/05/2023  7:53 AM  Pre-Procedure Preparation:  Monitoring: As per clinic protocol. Respiration, ETCO2, SpO2, BP, heart rate and rhythm monitor placed and checked for adequate function Safety Precautions: Patient was assessed for positional comfort and pressure points before starting the procedure. Time-out: I initiated and conducted the "Time-out" before starting the procedure, as per protocol. The patient was asked to participate by confirming the accuracy of the "Time Out" information. Verification of the correct person, site, and procedure were performed and confirmed by me, the nursing staff, and the patient. "Time-out" conducted as per Joint Commission's Universal Protocol (UP.01.01.01). Time: 0819 Start Time: 0819 hrs.  Description/Narrative of Procedure:          Start Time: 0819  hrs.  Rationale (medical necessity): procedure needed and proper for the diagnosis and/or treatment of the patient's medical symptoms and needs. Procedural Technique Safety Precautions: Aspiration looking for blood return was conducted prior to all injections. At no point did we inject any substances, as a needle was being advanced. No attempts were  made at seeking any paresthesias. Safe injection practices and needle disposal techniques used. Medications properly checked for expiration dates. SDV (single dose vial) medications used. Description of the Procedure: Protocol guidelines were followed. The patient was assisted into a comfortable position. The target area was identified and the area prepped in the usual manner. Skin & deeper tissues infiltrated with local anesthetic. Appropriate amount of time allowed to pass for local anesthetics to take effect. The procedure needles were then advanced to the target area. Proper needle placement secured. Negative aspiration confirmed. Solution injected in intermittent fashion, asking for systemic symptoms every 0.5cc of injectate. The needles were then removed and the area cleansed, making sure to leave some of the prepping solution back to take advantage of its long term bactericidal properties.  Technical description of procedure:  Fluoroscopy using a posterior anterior 45 degree angle from the midline aiming at the anterolateral aspect of the patient was used to find a direct path into the sacroiliac joint, the superior medial to posterior superior iliac spine.  The skin was marked where the desired target and the skin infiltrated with local anesthetics.  The procedure needle was then advanced until the joint was entered.  Once inside of the joint, we then proceeded to inject the desired solution.  Vitals:   05/05/23 0815 05/05/23 0820 05/05/23 0825 05/05/23 0828  BP: (!) 140/63 131/84 137/67 (!) 145/88  Pulse:      Resp: 16 17 16 16   Temp:       TempSrc:      SpO2: 100% 100% 100% 100%  Weight:      Height:         End Time: 0824 hrs.  Imaging Guidance (Non-Spinal):          Type of Imaging Technique: Fluoroscopy Guidance (Non-Spinal) Indication(s): Fluoroscopy guidance for needle placement to enhance accuracy in procedures requiring precise needle localization for targeted delivery of medication in or near specific anatomical locations not easily accessible without such real-time imaging assistance. Exposure Time: Please see nurses notes. Contrast: None used. Fluoroscopic Guidance: I was personally present during the use of fluoroscopy. "Tunnel Vision Technique" used to obtain the best possible view of the target area. Parallax error corrected before commencing the procedure. "Direction-depth-direction" technique used to introduce the needle under continuous pulsed fluoroscopy. Once target was reached, antero-posterior, oblique, and lateral fluoroscopic projection used confirm needle placement in all planes. Images permanently stored in EMR. Interpretation: No contrast injected. I personally interpreted the imaging intraoperatively. Adequate needle placement confirmed in multiple planes. Permanent images saved into the patient's record.  Post-operative Assessment:  Post-procedure Vital Signs:  Pulse/HCG Rate: 6060 Temp: 97.9 F (36.6 C) Resp: 16 BP: (!) 145/88 SpO2: 100 %  EBL: None  Complications: No immediate post-treatment complications observed by team, or reported by patient.  Note: The patient tolerated the entire procedure well. A repeat set of vitals were taken after the procedure and the patient was kept under observation following institutional policy, for this type of procedure. Post-procedural neurological assessment was performed, showing return to baseline, prior to discharge. The patient was provided with post-procedure discharge instructions, including a section on how to identify potential problems. Should any  problems arise concerning this procedure, the patient was given instructions to immediately contact us, at any time, without hesitation. In any case, we plan to contact the patient by telephone for a follow-up status report regarding this interventional procedure.  Comments:  No additional relevant information.  Plan of Care (POC)  Orders:  Orders Placed This Encounter  Procedures   SACROILIAC JOINT INJECTION    Scheduling Instructions:     Side: Bilateral     Sedation: Patient's choice.     Timeframe: Today    Where will this procedure be performed?:   [facility]   DG [facility] C-ARM 1-60 MIN NO REPORT    Intraoperative interpretation by procedural physician at [facility].    Standing Status:   Standing    Number of Occurrences:   1    Reason for exam::   Assistance in needle guidance and placement for procedures requiring needle placement in or near specific anatomical locations not easily accessible without such assistance.   Informed Consent Details: Physician/Practitioner Attestation; Transcribe to consent form and obtain patient signature    Nursing Order: Transcribe to consent form and obtain patient signature. Note: Always confirm laterality of pain with Ms. Faylene Million, before procedure.    Physician/Practitioner attestation of informed consent for procedure/surgical case:   I, the physician/practitioner, attest that I have discussed with the patient the benefits, risks, side effects, alternatives, likelihood of achieving goals and potential problems during recovery for the procedure that I have provided informed consent.    Procedure:   Sacroiliac Joint Block    Physician/Practitioner performing the procedure:   Sophronia Varney A. Laban Emperor, MD    Indication/Reason:   Chronic Low Back and Hip Pain secondary to Sacroiliac Joint Pain (Arthralgia/Arthropathy)   Provide equipment / supplies at bedside    Procedure tray: "Block Tray" (Disposable  single use) Skin  infiltration needle: Regular 1.5-in, 25-G, (x1) Block Needle type: Spinal Amount/quantity: 2 Size: Medium (5-inch) Gauge: 22G    Standing Status:   Standing    Number of Occurrences:   1    Specify:   Block Tray   Saline lock IV    Have LR [phone] mL available and administer at 125 mL/hr if patient becomes hypotensive.    Standing Status:   Standing    Number of Occurrences:   1   Chronic Opioid Analgesic:  No chronic opioid analgesics therapy prescribed by our practice..   Medications ordered for procedure: Meds ordered this encounter  Medications   lidocaine (XYLOCAINE) 2 % (with pres) injection 400 mg   pentafluoroprop-tetrafluoroeth (GEBAUERS) aerosol   ropivacaine (PF) 2 mg/mL (0.2%) (NAROPIN) injection 4 mL   methylPREDNISolone acetate (DEPO-MEDROL) injection 80 mg   ropivacaine (PF) 2 mg/mL (0.2%) (NAROPIN) injection 4 mL   methylPREDNISolone acetate (DEPO-MEDROL) injection 80 mg   midazolam (VERSED) injection 0.5-2 mg    Make sure Flumazenil is available in the pyxis when using this medication. If oversedation occurs, administer 0.2 mg IV over 15 sec. If after 45 sec no response, administer 0.2 mg again over 1 min; may repeat at 1 min intervals; not to exceed 4 doses (1 mg)   Medications administered: We administered lidocaine, pentafluoroprop-tetrafluoroeth, ropivacaine (PF) 2 mg/mL (0.2%), methylPREDNISolone acetate, ropivacaine (PF) 2 mg/mL (0.2%), and methylPREDNISolone acetate.  See the medical record for exact dosing, route, and time of administration.  Follow-up plan:   Return in about 2 weeks (around 05/19/2023) for (Face2F), (PPE).       Interventional Therapies  Risk  Complexity Considerations:   WNL   Planned  Pending:   Therapeutic bilateral SI joint block R6L3    Under consideration:   Diagnostic left lumbar facet MBB #3   Possible right lumbar facet RFA #1  Possible right SI joint RFA #1  Possible left  SI joint RFA #1  Therapeutic left cervical  facet RFA #1    Completed:   Palliative right gluteal MNB  Palliative right SI joint Blk x5 (07/04/2021) (100/90/100/100)  Palliative left SI joint Blk x2 (09/28/2018) (100/100/100/90-100)  Palliative right L4-5 LESI x1 (12/10/2015) (100/100/100/100)  Palliative left L4-5 LESI x1 (01/11/2015) (100/100/95/95)  Therapeutic right trochanteric bursa inj. x1 (04/09/2021) (100/100/100/100)  Therapeutic right IA hip inj. x1 (04/09/2021) (100/100/100/100)  Palliative left IA hip injection x2 (06/14/2015) (100/100/80/80-90)  Palliative right lumbar facet MBB x3 (07/15/2018) (100/100/90/100)  Diagnostic left lumbar facet MBB x2 (03/25/2022) (100/100/100/100)  Diagnostic right great toe IA inj. x1  Palliative right IA small joint inj. 1st MTP (Dorsal Metatarsophalangeal ) x1 (10/15/2017) (75/75/75/50-75)  Diagnostic left cervical facet MBB x1 (09/13/2019) (100/100/95/>75)    Therapeutic  Palliative (PRN) options:   Palliative right gluteal MNB Palliative SI joint block   Palliative L4-5 LESI   Palliative lumbar facet block   Diagnostic great toe IA injection   Palliative right IA small joint injection 1st MTP (Dorsal Metatarsophalangeal ) #2  Diagnostic cervical facet MBB        Recent Visits Date Type Provider Dept  03/30/23 Office Visit Delano Metz, MD [facility]  Showing recent visits within past 90 days and meeting all other requirements Today's Visits Date Type Provider Dept  05/05/23 Procedure visit Delano Metz, MD [facility]  Showing today's visits and meeting all other requirements Future Appointments Date Type Provider Dept  05/19/23 Appointment Delano Metz, MD [facility]  Showing future appointments within next 90 days and meeting all other requirements  Disposition: Discharge home  Discharge (Date  Time): 05/05/2023; 0831 hrs.   Primary Care Physician: Dale Greybull, MD Location: [facility]  Facility Note by: Oswaldo Done, MD (TTS technology used. I apologize for any typographical errors that were not detected and corrected.) Date: 05/05/2023; Time: 8:44 AM  Disclaimer:  Medicine is not an Visual merchandiser. The only guarantee in medicine is that nothing is guaranteed. It is important to note that the decision to proceed with this intervention was based on the information collected from the patient. The Data and conclusions were drawn from the patient's questionnaire, the interview, and the physical examination. Because the information was provided in large part by the patient, it cannot be guaranteed that it has not been purposely or unconsciously manipulated. Every effort has been made to obtain as much relevant data as possible for this evaluation. It is important to note that the conclusions that lead to this procedure are derived in large part from the available data. Always take into account that the treatment will also be dependent on availability of resources and existing treatment guidelines, considered by other Pain Management Practitioners as being common knowledge and practice, at the time of the intervention. For Medico-Legal purposes, it is also important to point out that variation in procedural techniques and pharmacological choices are the acceptable norm. The indications, contraindications, technique, and results of the above procedure should only be interpreted and judged by a Board-Certified Interventional Pain Specialist with extensive familiarity and expertise in the same exact procedure and technique.

## 2023-05-05 ENCOUNTER — Ambulatory Visit
Admission: RE | Admit: 2023-05-05 | Discharge: 2023-05-05 | Disposition: A | Discharge status: Home / Self Care | Source: Ambulatory Visit | Attending: Pain Medicine | Admitting: Pain Medicine

## 2023-05-05 ENCOUNTER — Ambulatory Visit: Attending: Pain Medicine | Admitting: Pain Medicine

## 2023-05-05 ENCOUNTER — Encounter: Payer: Self-pay | Admitting: Pain Medicine

## 2023-05-05 VITALS — BP 145/88 | HR 60 | Temp 97.9°F | Resp 16 | Ht 62.0 in | Wt 120.0 lb

## 2023-05-05 DIAGNOSIS — M461 Sacroiliitis, not elsewhere classified: Secondary | ICD-10-CM | POA: Diagnosis not present

## 2023-05-05 DIAGNOSIS — M5388 Other specified dorsopathies, sacral and sacrococcygeal region: Secondary | ICD-10-CM

## 2023-05-05 DIAGNOSIS — G8929 Other chronic pain: Secondary | ICD-10-CM

## 2023-05-05 DIAGNOSIS — M545 Low back pain, unspecified: Secondary | ICD-10-CM

## 2023-05-05 DIAGNOSIS — M9904 Segmental and somatic dysfunction of sacral region: Secondary | ICD-10-CM | POA: Insufficient documentation

## 2023-05-05 DIAGNOSIS — M533 Sacrococcygeal disorders, not elsewhere classified: Secondary | ICD-10-CM | POA: Insufficient documentation

## 2023-05-05 MED ORDER — PENTAFLUOROPROP-TETRAFLUOROETH EX AERO
INHALATION_SPRAY | Freq: Once | CUTANEOUS | Status: AC
  Administered 2023-05-05: 30 via TOPICAL

## 2023-05-05 MED ORDER — ROPIVACAINE HCL 2 MG/ML IJ SOLN
4.0000 mL | Freq: Once | INTRAMUSCULAR | Status: AC
Start: 2023-05-05 — End: 2023-05-05
  Administered 2023-05-05: 4 mL via INTRA_ARTICULAR

## 2023-05-05 MED ORDER — ROPIVACAINE HCL 2 MG/ML IJ SOLN
INTRAMUSCULAR | Status: AC
  Filled 2023-05-05: qty 20

## 2023-05-05 MED ORDER — LIDOCAINE HCL 2 % IJ SOLN
INTRAMUSCULAR | Status: AC
  Filled 2023-05-05: qty 20

## 2023-05-05 MED ORDER — METHYLPREDNISOLONE ACETATE 80 MG/ML IJ SUSP
80.0000 mg | Freq: Once | INTRAMUSCULAR | Status: AC
  Administered 2023-05-05: 80 mg via INTRA_ARTICULAR

## 2023-05-05 MED ORDER — MIDAZOLAM HCL 2 MG/2ML IJ SOLN
0.5000 mg | Freq: Once | INTRAMUSCULAR | Status: DC
Start: 2023-05-05 — End: 2023-05-05

## 2023-05-05 MED ORDER — METHYLPREDNISOLONE ACETATE 80 MG/ML IJ SUSP
INTRAMUSCULAR | Status: AC
  Filled 2023-05-05: qty 2

## 2023-05-05 MED ORDER — LIDOCAINE HCL 2 % IJ SOLN
20.0000 mL | Freq: Once | INTRAMUSCULAR | Status: AC
Start: 2023-05-05 — End: 2023-05-05
  Administered 2023-05-05: 400 mg

## 2023-05-05 NOTE — Progress Notes (Signed)
 Safety precautions to be maintained throughout the outpatient stay will include: orient to surroundings, keep bed in low position, maintain call bell within reach at all times, provide assistance with transfer out of bed and ambulation.

## 2023-05-06 ENCOUNTER — Telehealth: Payer: Self-pay | Admitting: *Deleted

## 2023-05-06 DIAGNOSIS — H25813 Combined forms of age-related cataract, bilateral: Secondary | ICD-10-CM | POA: Diagnosis not present

## 2023-05-06 NOTE — Telephone Encounter (Signed)
 No problems post procedure.

## 2023-05-07 ENCOUNTER — Ambulatory Visit: Payer: BC Managed Care – PPO | Attending: Student

## 2023-05-07 DIAGNOSIS — I5181 Takotsubo syndrome: Secondary | ICD-10-CM | POA: Diagnosis not present

## 2023-05-07 DIAGNOSIS — J301 Allergic rhinitis due to pollen: Secondary | ICD-10-CM | POA: Diagnosis not present

## 2023-05-07 DIAGNOSIS — J3089 Other allergic rhinitis: Secondary | ICD-10-CM | POA: Diagnosis not present

## 2023-05-07 DIAGNOSIS — J3081 Allergic rhinitis due to animal (cat) (dog) hair and dander: Secondary | ICD-10-CM | POA: Diagnosis not present

## 2023-05-07 LAB — ECHOCARDIOGRAM COMPLETE
AR max vel: 2.31 cm2
AV Area VTI: 2.24 cm2
AV Area mean vel: 2.22 cm2
AV Mean grad: 4 mmHg
AV Peak grad: 7.3 mmHg
Ao pk vel: 1.35 m/s
Area-P 1/2: 2.99 cm2
S' Lateral: 2.63 cm

## 2023-05-12 DIAGNOSIS — J301 Allergic rhinitis due to pollen: Secondary | ICD-10-CM | POA: Diagnosis not present

## 2023-05-12 DIAGNOSIS — J3089 Other allergic rhinitis: Secondary | ICD-10-CM | POA: Diagnosis not present

## 2023-05-12 DIAGNOSIS — J3081 Allergic rhinitis due to animal (cat) (dog) hair and dander: Secondary | ICD-10-CM | POA: Diagnosis not present

## 2023-05-14 ENCOUNTER — Encounter: Payer: Self-pay | Admitting: Internal Medicine

## 2023-05-14 ENCOUNTER — Ambulatory Visit: Admitting: Internal Medicine

## 2023-05-14 VITALS — BP 114/68 | HR 75 | Temp 98.0°F | Resp 16 | Ht 62.0 in | Wt 122.2 lb

## 2023-05-14 DIAGNOSIS — I2510 Atherosclerotic heart disease of native coronary artery without angina pectoris: Secondary | ICD-10-CM

## 2023-05-14 DIAGNOSIS — I5181 Takotsubo syndrome: Secondary | ICD-10-CM | POA: Diagnosis not present

## 2023-05-14 DIAGNOSIS — D649 Anemia, unspecified: Secondary | ICD-10-CM

## 2023-05-14 DIAGNOSIS — G4730 Sleep apnea, unspecified: Secondary | ICD-10-CM | POA: Insufficient documentation

## 2023-05-14 DIAGNOSIS — E559 Vitamin D deficiency, unspecified: Secondary | ICD-10-CM | POA: Diagnosis not present

## 2023-05-14 DIAGNOSIS — E041 Nontoxic single thyroid nodule: Secondary | ICD-10-CM

## 2023-05-14 DIAGNOSIS — F419 Anxiety disorder, unspecified: Secondary | ICD-10-CM

## 2023-05-14 DIAGNOSIS — E538 Deficiency of other specified B group vitamins: Secondary | ICD-10-CM | POA: Diagnosis not present

## 2023-05-14 DIAGNOSIS — K219 Gastro-esophageal reflux disease without esophagitis: Secondary | ICD-10-CM

## 2023-05-14 MED ORDER — ATORVASTATIN CALCIUM 10 MG PO TABS: 10.0000 mg | ORAL_TABLET | Freq: Every day | ORAL | 3 refills | Status: DC

## 2023-05-14 MED ORDER — CYANOCOBALAMIN 1000 MCG/ML IJ SOLN
1000.0000 ug | Freq: Once | INTRAMUSCULAR | Status: AC
  Administered 2023-05-14: 1000 ug via INTRAMUSCULAR

## 2023-05-14 NOTE — Addendum Note (Signed)
 Addended by: Victorino Grates D on: 05/14/2023 08:42 AM   Modules accepted: Orders

## 2023-05-14 NOTE — Assessment & Plan Note (Signed)
Continues on nexium.  No upper symptoms reported.

## 2023-05-14 NOTE — Assessment & Plan Note (Addendum)
 Continues on aspirin. Taking jardiance. Continue on metoprolol - taking in the evening. ECHO - 05/07/23 - EF 60-65% with no significant valve abnormality. Staying active. Exercising. Has f/u with cardiology next month.

## 2023-05-14 NOTE — Assessment & Plan Note (Signed)
 Cardiac cath 11/25/22 - Mild to moderate, nonobstructive coronary artery disease with 30-40% mid LAD stenosis.  No significant disease noted in the LMCA, LCx, or RCA.. Mildly reduced left ventricular systolic function with mid left ventricular wall motion abnormality most consistent with a Takotsubo variant. Normal left ventricular filling pressure.Continues on aspirin. Taking jardiance.  Has not started atorvastatin. Plans to start. Instructed to take toprol 12.5mg  in the evening. ECHO - 05/07/23 - EF 60-65% with no significant valve abnormality. No chest pain or sob reported. Staying active. Keep f/u with cardiology.

## 2023-05-14 NOTE — Progress Notes (Signed)
 Subjective:    Patient ID: Shannon [name], female    DOB: 1965-07-08, 58 y.o.   MRN: [id number]  Patient here for  Chief Complaint  Patient presents with   Medical Management of Chronic Issues    HPI Here for a scheduled follow up - follow up regarding CAD, GERD and  increased stress. Had f/u with GI 04/03/23. Had recent colonoscopy - diverticulosis. Recommended high fiber diet. Referred to pulmonary. Evaluated 03/23/23 - recommended HST. Was seen in ER 04/21/23 - left lower quadrant pain - CT mild sigmoid diverticulitis. Treated. Better. Keeps bowels moving with prn stool softener. ECHO - 05/07/23 - EF 60-65% with no significant valve abnormality. Staying active. Going to the gym. No chest pain or sob with increased activity or exertion. Increased stress - family medical issues. Overall appears to be handling things relatively well.    Past Medical History:  Diagnosis Date   Anemia    Anxiety    Breast pain, right 11/07/2012   Breathing difficulty 06/20/2014   Environmental allergies    GERD (gastroesophageal reflux disease)    Hiatal hernia    small   History of migraine headaches    Hyperthyroidism    s/p ablation   Vaginitis 01/16/2013   Past Surgical History:  Procedure Laterality Date   BREAST BIOPSY Right 12/05/2015   neg   CESAREAN SECTION  11/98   Dr Colletta [place]   CHOLECYSTECTOMY  11/05   Dr Michela Pitcher   JOINT REPLACEMENT Left 2017   hip   LEFT HEART CATH AND CORONARY ANGIOGRAPHY N/A 11/25/2022   Procedure: LEFT HEART CATH AND CORONARY ANGIOGRAPHY;  Surgeon: Yvonne Kendall, MD;  Location: [facility];  Service: Cardiovascular;  Laterality: N/A;   NASAL SINUS SURGERY  11/93   SEPTOPLASTY  5/90   Dr Esmeralda Links   TUBAL LIGATION  02/1998   UMBILICAL HERNIA REPAIR  02/1998   Family History  Problem Relation Age of Onset   Heart disease Father    Hypertension Father    Thyroid disease Father    Hypercholesterolemia Father    Kidney cancer Father    Leukemia Father         hairy cell   Hypercholesterolemia Mother    Thyroid disease Mother    Breast cancer Mother 1   Breast cancer Other        maternal great grandmother   Heart disease Maternal Grandfather        MI - age 67   Prostate cancer Paternal Grandfather    Pancreatic cancer Paternal Grandfather    Lung cancer Paternal Grandfather    Bladder Cancer Neg Hx    Social History   Socioeconomic History   Marital status: Married    Spouse name: Not on file   Number of children: 1   Years of education: Not on file   Highest education level: 12th grade  Occupational History   Not on file  Tobacco Use   Smoking status: Former    Current packs/day: 0.00    Types: Cigarettes    Quit date: 01/28/2000    Years since quitting: 23.3   Smokeless tobacco: Never  Substance and Sexual Activity   Alcohol use: No    Alcohol/week: 0.0 standard drinks of alcohol   Drug use: No   Sexual activity: Not Currently    Partners: Male  Other Topics Concern   Not on file  Social History Narrative   Not on file   Social Drivers of  Health   Financial Resource Strain: Low Risk  (03/17/2023)   Overall Financial Resource Strain (CARDIA)    Difficulty of Paying Living Expenses: Not hard at all  Food Insecurity: No Food Insecurity (03/17/2023)   Hunger Vital Sign    Worried About Running Out of Food in the Last Year: Never true    Ran Out of Food in the Last Year: Never true  Transportation Needs: No Transportation Needs (03/17/2023)   PRAPARE - Administrator, Civil Service (Medical): No    Lack of Transportation (Non-Medical): No  Physical Activity: Sufficiently Active (03/17/2023)   Exercise Vital Sign    Days of Exercise per Week: 5 days    Minutes of Exercise per Session: 40 min  Stress: No Stress Concern Present (03/17/2023)   [employer] of Occupational Health - Occupational Stress Questionnaire    Feeling of Stress : Only a little  Social Connections: Socially Integrated  (03/17/2023)   Social Connection and Isolation Panel [NHANES]    Frequency of Communication with Friends and Family: More than three times a week    Frequency of Social Gatherings with Friends and Family: More than three times a week    Attends Religious Services: More than 4 times per year    Active Member of [employer] or Organizations: Yes    Attends Engineer, structural: More than 4 times per year    Marital Status: Married     Review of Systems  Constitutional:  Negative for appetite change and unexpected weight change.  HENT:  Negative for congestion and sinus pressure.   Respiratory:  Negative for cough, chest tightness and shortness of breath.   Cardiovascular:  Negative for chest pain, palpitations and leg swelling.  Gastrointestinal:  Negative for abdominal pain, diarrhea, nausea and vomiting.  Genitourinary:  Negative for difficulty urinating and dysuria.  Musculoskeletal:  Negative for joint swelling and myalgias.  Skin:  Negative for color change and rash.  Neurological:  Negative for dizziness and headaches.  Psychiatric/Behavioral:  Negative for agitation and dysphoric mood.        Objective:     BP 114/68   Pulse 75   Temp 98 F (36.7 C)   Resp 16   Ht 5\' 2"  (1.575 m)   Wt 122 lb 3.2 oz (55.4 kg)   LMP 02/12/2013   SpO2 98%   BMI 22.35 kg/m  Wt Readings from Last 3 Encounters:  05/14/23 122 lb 3.2 oz (55.4 kg)  05/05/23 120 lb (54.4 kg)  04/21/23 122 lb (55.3 kg)    Physical Exam Vitals reviewed.  Constitutional:      General: She is not in acute distress.    Appearance: Normal appearance.  HENT:     Head: Normocephalic and atraumatic.     Right Ear: External ear normal.     Left Ear: External ear normal.     Mouth/Throat:     Pharynx: No oropharyngeal exudate or posterior oropharyngeal erythema.  Eyes:     General: No scleral icterus.       Right eye: No discharge.        Left eye: No discharge.     Conjunctiva/sclera: Conjunctivae  normal.  Neck:     Thyroid: No thyromegaly.  Cardiovascular:     Rate and Rhythm: Normal rate and regular rhythm.  Pulmonary:     Effort: No respiratory distress.     Breath sounds: Normal breath sounds. No wheezing.  Abdominal:     General:  Bowel sounds are normal.     Palpations: Abdomen is soft.     Tenderness: There is no abdominal tenderness.  Musculoskeletal:        General: No swelling or tenderness.     Cervical back: Neck supple. No tenderness.  Lymphadenopathy:     Cervical: No cervical adenopathy.  Skin:    Findings: No erythema or rash.  Neurological:     Mental Status: She is alert.  Psychiatric:        Mood and Affect: Mood normal.        Behavior: Behavior normal.         Outpatient Encounter Medications as of 05/14/2023  Medication Sig   acetaminophen (TYLENOL) 500 MG tablet Take 1,000 mg by mouth every 6 (six) hours as needed for moderate pain (pain score 4-6) or mild pain (pain score 1-3).   ALPRAZolam (XANAX) 0.25 MG tablet TAKE ONE TABLET DAILY IF NEEDED FOR ANXIETY   aspirin EC 81 MG tablet Take 1 tablet (81 mg total) by mouth daily. Swallow whole.   atorvastatin (LIPITOR) 20 MG tablet Take 1 tablet (20 mg total) by mouth daily.   cholecalciferol (VITAMIN D3) 25 MCG (1000 UNIT) tablet Take 1,000 Units by mouth daily.   Cyanocobalamin 1000 MCG/ML KIT Inject 1,000 mcg as directed every 30 (thirty) days.   EPINEPHrine 0.3 mg/0.3 mL IJ SOAJ injection Inject 0.3 mg into the muscle as needed for anaphylaxis.   JARDIANCE 10 MG TABS tablet TAKE ONE TABLET (10 MG) BY MOUTH ONCE DAILY   metoprolol succinate (TOPROL XL) 25 MG 24 hr tablet Take 0.5 tablets (12.5 mg total) by mouth daily. At bedtime   nitroGLYCERIN (NITROSTAT) 0.4 MG SL tablet Place 1 tablet (0.4 mg total) under the tongue every 5 (five) minutes as needed for chest pain.   omeprazole (PRILOSEC) 40 MG capsule Take 40 mg by mouth daily.   ondansetron (ZOFRAN-ODT) 4 MG disintegrating tablet Take 1  tablet (4 mg total) by mouth every 8 (eight) hours as needed for nausea or vomiting.   oxyCODONE-acetaminophen (PERCOCET/ROXICET) 5-325 MG tablet Take 1 tablet by mouth every 4 (four) hours as needed for severe pain (pain score 7-10).   UBRELVY 100 MG TABS Take by mouth.   [DISCONTINUED] amoxicillin-clavulanate (AUGMENTIN) 875-125 MG tablet Take 1 tablet by mouth 2 (two) times daily. (Patient not taking: Reported on 05/05/2023)   Facility-Administered Encounter Medications as of 05/14/2023  Medication   cyanocobalamin (VITAMIN B12) injection 1,000 mcg     Lab Results  Component Value Date   WBC 9.1 04/21/2023   HGB 14.2 04/21/2023   HCT 42.7 04/21/2023   PLT 237 04/21/2023   GLUCOSE 106 (H) 04/21/2023   CHOL 147 04/09/2023   TRIG 90.0 04/09/2023   HDL 77.50 04/09/2023   LDLCALC 51 04/09/2023   ALT 18 04/21/2023   AST 18 04/21/2023   NA 138 04/21/2023   K 3.7 04/21/2023   CL 104 04/21/2023   CREATININE 0.66 04/21/2023   BUN 10 04/21/2023   CO2 26 04/21/2023   TSH 1.031 11/24/2022   INR 1.0 11/24/2022    DG PAIN CLINIC C-ARM 1-60 MIN NO REPORT Result Date: 05/05/2023 Fluoro was used, but no Radiologist interpretation will be provided. Please refer to "NOTES" tab for provider progress note.      Assessment & Plan:  B12 deficiency -     Cyanocobalamin  Vitamin D insufficiency Assessment & Plan: Recheck vitamin D level with next labs.   Orders: -  VITAMIN D 25 Hydroxy (Vit-D Deficiency, Fractures); Future  Thyroid nodule Assessment & Plan: S/p biopsy. Ok. Continue f/u with endocrinology.    Stress-induced cardiomyopathy Assessment & Plan: Continues on aspirin. Taking jardiance. Continue on metoprolol - taking in the evening. ECHO - 05/07/23 - EF 60-65% with no significant valve abnormality. Staying active. Exercising. Has f/u with cardiology next month.    Anemia, unspecified type Assessment & Plan: Follow cbc.  Had f/u with GI 04/03/23. Had recent colonoscopy -  diverticulosis. Recommended high fiber diet.    Anxiety Assessment & Plan: Overall appears to be handling things relatively well. Follow.    Coronary artery disease involving native coronary artery of native heart without angina pectoris Assessment & Plan: Cardiac cath 11/25/22 - Mild to moderate, nonobstructive coronary artery disease with 30-40% mid LAD stenosis.  No significant disease noted in the LMCA, LCx, or RCA.. Mildly reduced left ventricular systolic function with mid left ventricular wall motion abnormality most consistent with a Takotsubo variant. Normal left ventricular filling pressure.Continues on aspirin. Taking jardiance.  Has not started atorvastatin. Plans to start. Instructed to take toprol 12.5mg  in the evening. ECHO - 05/07/23 - EF 60-65% with no significant valve abnormality. No chest pain or sob reported. Staying active. Keep f/u with cardiology.    Gastroesophageal reflux disease, unspecified whether esophagitis present Assessment & Plan: Continues on nexium. No upper symptoms reported.    Sleep apnea, unspecified type Assessment & Plan: S/p HST. Started cpap Monday. Has been adjusting/changing mask - trying to find something that works for her. Discussed f/u with pulmonary. Planning to try a new mask today.       Dellar Fenton, MD

## 2023-05-14 NOTE — Assessment & Plan Note (Signed)
Overall appears to be handling things relatively well.  Follow.   

## 2023-05-14 NOTE — Assessment & Plan Note (Signed)
 S/p HST. Started cpap Monday. Has been adjusting/changing mask - trying to find something that works for her. Discussed f/u with pulmonary. Planning to try a new mask today.

## 2023-05-14 NOTE — Assessment & Plan Note (Signed)
Recheck vitamin D level with next labs.  

## 2023-05-14 NOTE — Assessment & Plan Note (Signed)
 Follow cbc.  Had f/u with GI 04/03/23. Had recent colonoscopy - diverticulosis. Recommended high fiber diet.

## 2023-05-14 NOTE — Assessment & Plan Note (Signed)
 S/p biopsy. Ok. Continue f/u with endocrinology.

## 2023-05-19 ENCOUNTER — Ambulatory Visit: Admitting: Pain Medicine

## 2023-05-19 DIAGNOSIS — J301 Allergic rhinitis due to pollen: Secondary | ICD-10-CM | POA: Diagnosis not present

## 2023-05-19 DIAGNOSIS — J3089 Other allergic rhinitis: Secondary | ICD-10-CM | POA: Diagnosis not present

## 2023-05-19 DIAGNOSIS — J3081 Allergic rhinitis due to animal (cat) (dog) hair and dander: Secondary | ICD-10-CM | POA: Diagnosis not present

## 2023-05-25 ENCOUNTER — Encounter: Payer: Self-pay | Admitting: Internal Medicine

## 2023-05-25 NOTE — Telephone Encounter (Signed)
 Please schedule her an appt to discuss. Thanks.

## 2023-05-25 NOTE — Telephone Encounter (Signed)
 Pt scheduled

## 2023-05-26 ENCOUNTER — Ambulatory Visit: Attending: Pain Medicine | Admitting: Pain Medicine

## 2023-05-26 ENCOUNTER — Encounter: Payer: Self-pay | Admitting: Pain Medicine

## 2023-05-26 VITALS — BP 127/76 | HR 71 | Temp 98.3°F | Resp 16 | Ht 62.0 in | Wt 121.0 lb

## 2023-05-26 DIAGNOSIS — M533 Sacrococcygeal disorders, not elsewhere classified: Secondary | ICD-10-CM | POA: Insufficient documentation

## 2023-05-26 DIAGNOSIS — G8929 Other chronic pain: Secondary | ICD-10-CM | POA: Insufficient documentation

## 2023-05-26 DIAGNOSIS — J301 Allergic rhinitis due to pollen: Secondary | ICD-10-CM | POA: Diagnosis not present

## 2023-05-26 DIAGNOSIS — Z09 Encounter for follow-up examination after completed treatment for conditions other than malignant neoplasm: Secondary | ICD-10-CM | POA: Diagnosis not present

## 2023-05-26 DIAGNOSIS — M545 Low back pain, unspecified: Secondary | ICD-10-CM | POA: Insufficient documentation

## 2023-05-26 DIAGNOSIS — M25551 Pain in right hip: Secondary | ICD-10-CM | POA: Diagnosis not present

## 2023-05-26 DIAGNOSIS — J3089 Other allergic rhinitis: Secondary | ICD-10-CM | POA: Diagnosis not present

## 2023-05-26 DIAGNOSIS — J3081 Allergic rhinitis due to animal (cat) (dog) hair and dander: Secondary | ICD-10-CM | POA: Diagnosis not present

## 2023-05-26 NOTE — Progress Notes (Signed)
 PROVIDER NOTE: Interpretation of information contained herein should be left to medically-trained personnel. Specific patient instructions are provided elsewhere under "Patient Instructions" section of medical record. This document was created in part using AI and STT-dictation technology, any transcriptional errors that may result from this process are unintentional.  Patient: Shannon [name]  Service: E/M   PCP: Dellar Fenton, MD  DOB: 10/28/1965  DOS: 05/26/2023  Provider: Candi Chafe, MD  MRN: [id number]  Delivery: Face-to-face  Specialty: Interventional Pain Management  Type: Established Patient  Setting: Ambulatory outpatient facility  Specialty designation: 09  Referring Prov.: Dellar Fenton, MD  Location: Outpatient office facility       HPI  Ms. KABELLA [name], a 58 y.o. year old female, is here today because of her Chronic bilateral low back pain without sciatica [M54.50, G89.29]. Ms. Sonn primary complain today is Hip Pain (right)  Pertinent problems: [name] has Migraine; Epigastric pain; Chronic low back pain (1ry area of Pain) (Bilateral) (R>L) w/o sciatica; Lumbar lateral recess stenosis (L4-5) (Left); Lumbar facet hypertrophy; Chronic lumbar radicular pain (intermittent) (L4 Dermatome) (Left); Chronic hip pain (resolved after hip replacement) (Left); Lumbar spondylosis; Osteoarthritis of sacroiliac joint (Bilateral) (R>L); History of hip replacement (Left); Chronic sacroiliac joint pain (Right); Chronic myofascial pain syndrome (Right Gluteous Muscle); Chronic pain syndrome; Other specified dorsopathies, sacral and sacrococcygeal region; Lumbar Facet syndrome (Bilateral) (R>L); Spondylosis without myelopathy or radiculopathy, lumbosacral region; Chronic toe pain (Right); Great toe pain (Right); Osteoarthritis of great toe joint (Right); DDD (degenerative disc disease), lumbosacral; Right calf pain; Cervicalgia; Cervical myofascial pain syndrome (Left); Occipital neuralgia  (Left); DDD (degenerative disc disease), cervical (C5-6, C6-7); Grade 1 Anterolisthesis of C5/C6; Cervical facet arthropathy (Bilateral) (L>R); Cervical facet syndrome (Bilateral); Temporomandibular joint (TMJ) pain; Neck pain; Breast pain; Chronic hip pain (Right); Somatic dysfunction of sacroiliac joint (Right); Sacroiliac joint dysfunction (Right); Osteoarthritis of hip (Right); Impaired range of motion of hip (Right); Greater trochanteric bursitis (Right); Bursitis of hip (Right); Herpes zoster; Chronic low back pain (1ry area of Pain) (Left) w/o sciatica; Primary osteoarthritis of first metatarsophalangeal (MTP) joint; Chronic sacroiliac joint pain (Bilateral); and Carpal tunnel syndrome of left wrist on their pertinent problem list. Pain Assessment: Severity of Chronic pain is reported as a 2 /10. Location:   Right/radiates from mid buttock to right hip. Onset: More than a month ago. Quality: Aching, Discomfort. Timing: Intermittent. Modifying factor(s): nothing. Vitals:  height is 5\' 2"  (1.575 m) and weight is 121 lb (54.9 kg). Her temperature is 98.3 F (36.8 C). Her blood pressure is 127/76 and her pulse is 71. Her respiration is 16 and oxygen saturation is 98%.  BMI: Estimated body mass index is 22.13 kg/m as calculated from the following:   Height as of this encounter: 5\' 2"  (1.575 m).   Weight as of this encounter: 121 lb (54.9 kg). Last encounter: 03/30/2023. Last procedure: 05/05/2023.  Reason for encounter: post-procedure evaluation and assessment.  Discussed the use of AI scribe software for clinical note transcription with the patient, who gave verbal consent to proceed.  History of Present Illness   Shannon [name] is a 58 year old female who presents with right hip pain.  She experiences intermittent pain in her right hip, which began after the improvement of her previous sacroiliac joint pain. The pain is localized at the back of the hip joint and does not radiate to the groin area.  It is exacerbated by crossing her right leg. She has not engaged in any specific  exercises or stretching to address the pain.  She has a history of a previous hip replacement on the opposite side. No current medication use for this issue is mentioned.  No pain in the groin area.      Post-procedure evaluation   Procedure: Sacroiliac Joint Steroid Injection R6L3     Laterality: Bilateral     Level: PSIS (Posterior Superior Iliac Spine)  Imaging: Fluoroscopic guidance Anesthesia: Local anesthesia (1-2% Lidocaine ) Anxiolysis: None                 Sedation: No Sedation                       DOS: 05/05/2023  Performed by: Candi Chafe, MD  Purpose: Diagnostic/Therapeutic Indications: Sacroiliac joint pain in the lower back and hip area severe enough to impact quality of life or function. Rationale (medical necessity): procedure needed and proper for the diagnosis and/or treatment of Ms. Sweet's medical symptoms and needs. 1. Chronic low back pain (1ry area of Pain) (Bilateral) (R>L) w/o sciatica   2. Chronic sacroiliac joint pain (Bilateral)   3. Osteoarthritis of sacroiliac joint (Bilateral) (R>L)   4. Other specified dorsopathies, sacral and sacrococcygeal region   5. Sacroiliac joint dysfunction (Right)   6. Somatic dysfunction of sacroiliac joint (Right)    NAS-11 Pain score:   Pre-procedure: 3 /10   Post-procedure: 0-No pain/10    Effectiveness:  Initial hour after procedure: 100 %. Subsequent 4-6 hours post-procedure: 100 %. Analgesia past initial 6 hours: 100 % (pain has now gone into right buttock to right hip area). Ongoing improvement:  Analgesic: The patient indicates having attained an ongoing 100% relief of the pain that she was experiencing from the sacroiliac joint, bilaterally.  Currently she has a little bit of discomfort that is intermittent and related to certain types of activity.  This discomfort seems to be posterior to the right femoral head in the area  of the piriformis muscle and the superior gemellus muscle.  Crossing the right leg over the left with lateral rotation of the hip joint seems to cause some discomfort.  This seems to be more associated with the right hip and hip muscles than with the sacroiliac joint.  She denies any lower extremity pain, numbness or weakness in the distribution of the sciatic nerve or with any type of radicular distribution. Function: Ms. Sisto reports improvement in function ROM: Ms. Michaelides reports improvement in ROM   Pharmacotherapy Assessment  Analgesic: No chronic opioid analgesics therapy prescribed by our practice..   Monitoring: [place] PMP: PDMP reviewed during this encounter.       Pharmacotherapy: No side-effects or adverse reactions reported. Compliance: No problems identified. Effectiveness: Clinically acceptable.  Humberto Magnus, RN  05/26/2023  1:40 PM  Sign when Signing Visit Safety precautions to be maintained throughout the outpatient stay will include: orient to surroundings, keep bed in low position, maintain call bell within reach at all times, provide assistance with transfer out of bed and ambulation.    No results found for: "CBDTHCR" No results found for: "D8THCCBX" No results found for: "D9THCCBX"  UDS:  No results found for: "SUMMARY"    ROS  Constitutional: Denies any fever or chills Gastrointestinal: No reported hemesis, hematochezia, vomiting, or acute GI distress Musculoskeletal: Denies any acute onset joint swelling, redness, loss of ROM, or weakness Neurological: No reported episodes of acute onset apraxia, aphasia, dysarthria, agnosia, amnesia, paralysis, loss of coordination, or loss of consciousness  Medication Review  ALPRAZolam , Cyanocobalamin , EPINEPHrine, Ubrogepant, acetaminophen , aspirin  EC, atorvastatin , cholecalciferol, empagliflozin , metoprolol  succinate, nitroGLYCERIN , omeprazole, ondansetron , and oxyCODONE -acetaminophen   History Review  Allergy: Ms. Clere is  allergic to codeine, decongest-aid [pseudoephedrine], flagyl  [metronidazole ], misc. sulfonamide containing compounds, and sulfa antibiotics. Drug: Ms. Meeder  reports no history of drug use. Alcohol:  reports no history of alcohol use. Tobacco:  reports that she quit smoking about 23 years ago. Her smoking use included cigarettes. She has never used smokeless tobacco. Social: Ms. Cadena  reports that she quit smoking about 23 years ago. Her smoking use included cigarettes. She has never used smokeless tobacco. She reports that she does not drink alcohol and does not use drugs. Medical:  has a past medical history of Anemia, Anxiety, Breast pain, right (11/07/2012), Breathing difficulty (06/20/2014), Environmental allergies, GERD (gastroesophageal reflux disease), Hiatal hernia, History of migraine headaches, Hyperthyroidism, and Vaginitis (01/16/2013). Surgical: Ms. Beam  has a past surgical history that includes Septoplasty (5/90); Nasal sinus surgery (11/93); Cesarean section (11/98); Tubal ligation (02/1998); Umbilical hernia repair (02/1998); Cholecystectomy (11/05); Joint replacement (Left, 2017); Breast biopsy (Right, 12/05/2015); and LEFT HEART CATH AND CORONARY ANGIOGRAPHY (N/A, 11/25/2022). Family: family history includes Breast cancer in an other family member; Breast cancer (age of onset: 28) in her mother; Heart disease in her father and maternal grandfather; Hypercholesterolemia in her father and mother; Hypertension in her father; Kidney cancer in her father; Leukemia in her father; Lung cancer in her paternal grandfather; Pancreatic cancer in her paternal grandfather; Prostate cancer in her paternal grandfather; Thyroid  disease in her father and mother.  Laboratory Chemistry Profile   Renal Lab Results  Component Value Date   BUN 10 04/21/2023   CREATININE 0.66 04/21/2023   GFR 88.46 04/09/2023   GFRAA >60 05/02/2014   GFRNONAA >60 04/21/2023    Hepatic Lab Results  Component Value  Date   AST 18 04/21/2023   ALT 18 04/21/2023   ALBUMIN 3.8 04/21/2023   ALKPHOS 60 04/21/2023   LIPASE 21 04/21/2023    Electrolytes Lab Results  Component Value Date   NA 138 04/21/2023   K 3.7 04/21/2023   CL 104 04/21/2023   CALCIUM  9.4 04/21/2023   MG 2.1 11/26/2022    Bone Lab Results  Component Value Date   VD25OH 21.86 (L) 05/23/2022   25OHVITD1 28 (L) 10/05/2017   25OHVITD2 <1.0 10/05/2017   25OHVITD3 28 10/05/2017    Inflammation (CRP: Acute Phase) (ESR: Chronic Phase) Lab Results  Component Value Date   CRP 2 10/05/2017   ESRSEDRATE 7 10/05/2017         Note: Above Lab results reviewed.  Recent Imaging Review  ECHOCARDIOGRAM COMPLETE    ECHOCARDIOGRAM REPORT       Patient Name:   HENNY SABATINI Date of Exam: 05/07/2023 Medical Rec #:  [id number]      Height:       62.0 in Accession #:    [id number]     Weight:       120.0 lb Date of Birth:  1965-04-04      BSA:          1.539 m Patient Age:    57 years       BP:           111/71 mmHg Patient Gender: F              HR:           58 bpm. Exam Location:  [place] [place]  Procedure: 2D Echo, 3D Echo, Color Doppler, Cardiac Doppler and Strain Analysis            (Both Spectral and Color Flow Doppler were utilized during            procedure).  Indications:    I42.80 Non-ischemic cardiomyopathy   History:        Patient has prior history of Echocardiogram examinations, most                 recent 11/25/2022. Cardiomyopathy, CAD and Previous Myocardial                 Infarction, Risk Factors:Former Smoker and Dyslipidemia.;                 Medications:Calcium  Channel Blockers.   Sonographer:    Venson Ginger MHA, BS, RDCS Referring Phys: [id number] [facility] [place]  IMPRESSIONS   1. Left ventricular ejection fraction, by estimation, is 60 to 65%. The left ventricle has normal function. The left ventricle has no regional wall motion abnormalities. Left ventricular diastolic parameters were normal. The  average left ventricular  global longitudinal strain is -20.2 %. The global longitudinal strain is normal.  2. Right ventricular systolic function is normal. The right ventricular size is normal. Tricuspid regurgitation signal is inadequate for assessing PA pressure.  3. The mitral valve is normal in structure. No evidence of mitral valve regurgitation. No evidence of mitral stenosis.  4. The aortic valve is tricuspid. Aortic valve regurgitation is not visualized. No aortic stenosis is present.  5. The inferior vena cava is normal in size with greater than 50% respiratory variability, suggesting right atrial pressure of 3 mmHg.  FINDINGS  Left Ventricle: Left ventricular ejection fraction, by estimation, is 60 to 65%. The left ventricle has normal function. The left ventricle has no regional wall motion abnormalities. The average left ventricular global longitudinal strain is -20.2 %.  Strain was performed and the global longitudinal strain is normal. The left ventricular internal cavity size was normal in size. There is no left ventricular hypertrophy. Left ventricular diastolic parameters were normal.  Right Ventricle: The right ventricular size is normal. No increase in right ventricular wall thickness. Right ventricular systolic function is normal. Tricuspid regurgitation signal is inadequate for assessing PA pressure.  Left Atrium: Left atrial size was normal in size.  Right Atrium: Right atrial size was normal in size.  Pericardium: There is no evidence of pericardial effusion.  Mitral Valve: The mitral valve is normal in structure. No evidence of mitral valve regurgitation. No evidence of mitral valve stenosis.  Tricuspid Valve: The tricuspid valve is normal in structure. Tricuspid valve regurgitation is not demonstrated. No evidence of tricuspid stenosis.  Aortic Valve: The aortic valve is tricuspid. Aortic valve regurgitation is not visualized. No aortic stenosis is present. Aortic  valve mean gradient measures 4.0 mmHg. Aortic valve peak gradient measures 7.3 mmHg. Aortic valve area, by VTI measures 2.24  cm.  Pulmonic Valve: The pulmonic valve was normal in structure. Pulmonic valve regurgitation is not visualized. No evidence of pulmonic stenosis.  Aorta: The aortic root is normal in size and structure.  Venous: The inferior vena cava is normal in size with greater than 50% respiratory variability, suggesting right atrial pressure of 3 mmHg.  IAS/Shunts: No atrial level shunt detected by color flow Doppler.  Additional Comments: 3D was performed not requiring image post processing on an independent workstation and was indeterminate.    LEFT VENTRICLE PLAX 2D LVIDd:  4.35 cm   Diastology LVIDs:         2.63 cm   LV e' medial:    9.46 cm/s LV PW:         0.80 cm   LV E/e' medial:  9.0 LV IVS:        0.78 cm   LV e' lateral:   11.60 cm/s LVOT diam:     1.90 cm   LV E/e' lateral: 7.3 LV SV:         63 LV SV Index:   41        2D Longitudinal Strain LVOT Area:     2.84 cm  2D Strain GLS Avg:     -20.2 %                            3D Volume EF:                          3D EF:        52 %                          LV EDV:       142 ml                          LV ESV:       68 ml                          LV SV:        74 ml  RIGHT VENTRICLE RV Basal diam:  2.40 cm RV Mid diam:    1.96 cm RV S prime:     13.40 cm/s TAPSE (M-mode): 1.3 cm  LEFT ATRIUM             Index        RIGHT ATRIUM          Index LA diam:        2.90 cm 1.88 cm/m   RA Area:     6.81 cm LA Vol (A2C):   26.3 ml 17.09 ml/m  RA Volume:   11.60 ml 7.54 ml/m LA Vol (A4C):   16.8 ml 10.92 ml/m LA Biplane Vol: 22.0 ml 14.30 ml/m  AORTIC VALVE AV Area (Vmax):    2.31 cm AV Area (Vmean):   2.22 cm AV Area (VTI):     2.24 cm AV Vmax:           135.00 cm/s AV Vmean:          89.800 cm/s AV VTI:            0.280 m AV Peak Grad:      7.3 mmHg AV Mean Grad:      4.0 mmHg LVOT  Vmax:         110.00 cm/s LVOT Vmean:        70.200 cm/s LVOT VTI:          0.221 m LVOT/AV VTI ratio: 0.79   AORTA Ao Sinus diam: 2.82 cm Ao Asc diam:   3.00 cm  MITRAL VALVE MV Area (PHT): 2.99 cm    SHUNTS MV Decel Time: 254 msec    Systemic VTI:  0.22 m MV E velocity: 85.00 cm/s  Systemic Diam: 1.90 cm MV  A velocity: 87.80 cm/s MV E/A ratio:  0.97  Belva Boyden MD Electronically signed by Belva Boyden MD Signature Date/Time: 05/07/2023/7:12:02 PM      Final   Note: Reviewed        Physical Exam  General appearance: Well nourished, well developed, and well hydrated. In no apparent acute distress Mental status: Alert, oriented x 3 (person, place, & time)       Respiratory: No evidence of acute respiratory distress Eyes: PERLA Vitals: BP 127/76   Pulse 71   Temp 98.3 F (36.8 C)   Resp 16   Ht 5\' 2"  (1.575 m)   Wt 121 lb (54.9 kg)   LMP 02/12/2013   SpO2 98%   BMI 22.13 kg/m  BMI: Estimated body mass index is 22.13 kg/m as calculated from the following:   Height as of this encounter: 5\' 2"  (1.575 m).   Weight as of this encounter: 121 lb (54.9 kg). Ideal: Ideal body weight: 50.1 kg (110 lb 7.2 oz) Adjusted ideal body weight: 52 kg (114 lb 10.7 oz)  Assessment   Diagnosis Status  1. Chronic low back pain (1ry area of Pain) (Bilateral) (R>L) w/o sciatica   2. Chronic sacroiliac joint pain (Bilateral)   3. Chronic hip pain (Right)   4. Postop check    Resolved Resolved Improved   Updated Problems: No problems updated.  Plan of Care  Problem-specific:  Assessment and Plan    Right hip pain   Intermittent pain in the right hip, likely from the hip joint or surrounding muscles, is located at the back of the joint and worsens when crossing the right leg. There is no groin pain. The differential diagnosis includes muscle or capsule involvement. Symptoms began after sacroiliac joint pain improved. She prefers conservative management. Advise home  stretching exercises to alleviate muscle tightness. Contact provider if symptoms persist or worsen.  Heart attack   She has a history of myocardial infarction with no current symptoms or cardiac issues.       Ms. AVISHA BARTOSH has a current medication list which includes the following long-term medication(s): atorvastatin , metoprolol  succinate, and nitroglycerin .  Pharmacotherapy (Medications Ordered): No orders of the defined types were placed in this encounter.  Orders:  Orders Placed This Encounter  Procedures   Nursing Instructions:    Please complete this patient's postprocedure evaluation.    Scheduling Instructions:     Please complete this patient's postprocedure evaluation.   Follow-up plan:   Return if symptoms worsen or fail to improve.     Interventional Therapies  Risk  Complexity Considerations:   WNL   Planned  Pending:      Under consideration:   Diagnostic left lumbar facet MBB #3   Possible right lumbar facet RFA #1  Possible right SI joint RFA #1  Possible left SI joint RFA #1  Therapeutic left cervical facet RFA #1    Completed:   Palliative right gluteal MNB  Palliative right SI joint Blk x6 (05/05/2023) (100/100/100/100)  Palliative left SI joint Blk x3 (05/05/2023) (100/100/100/100)  Palliative right L4-5 LESI x1 (12/10/2015) (100/100/100/100)  Palliative left L4-5 LESI x1 (01/11/2015) (100/100/95/95)  Therapeutic right trochanteric bursa inj. x1 (04/09/2021) (100/100/100/100)  Therapeutic right IA hip inj. x1 (04/09/2021) (100/100/100/100)  Palliative left IA hip injection x2 (06/14/2015) (100/100/80/80-90)  Palliative right lumbar facet MBB x3 (07/15/2018) (100/100/90/100)  Diagnostic left lumbar facet MBB x2 (03/25/2022) (100/100/100/100)  Diagnostic right great toe IA inj. x1  Palliative right IA small joint  inj. 1st MTP (Dorsal Metatarsophalangeal ) x1 (10/15/2017) (75/75/75/50-75)  Diagnostic left cervical facet MBB x1 (09/13/2019)  (100/100/95/>75)    Therapeutic  Palliative (PRN) options:   Palliative right gluteal MNB Palliative SI joint block   Palliative L4-5 LESI   Palliative lumbar facet block   Diagnostic great toe IA injection   Palliative right IA small joint injection 1st MTP (Dorsal Metatarsophalangeal ) #2  Diagnostic cervical facet MBB       Recent Visits Date Type Provider Dept  05/05/23 Procedure visit Renaldo Caroli, MD [facility]  03/30/23 Office Visit Renaldo Caroli, MD [facility]  Showing recent visits within past 90 days and meeting all other requirements Today's Visits Date Type Provider Dept  05/26/23 Office Visit Renaldo Caroli, MD [facility]  Showing today's visits and meeting all other requirements Future Appointments No visits were found meeting these conditions. Showing future appointments within next 90 days and meeting all other requirements  I discussed the assessment and treatment plan with the patient. The patient was provided an opportunity to ask questions and all were answered. The patient agreed with the plan and demonstrated an understanding of the instructions.  Patient advised to call back or seek an in-person evaluation if the symptoms or condition worsens.  Duration of encounter: 20 minutes.  Total time on encounter, as per AMA guidelines included both the face-to-face and non-face-to-face time personally spent by the physician and/or other qualified health care professional(s) on the day of the encounter (includes time in activities that require the physician or other qualified health care professional and does not include time in activities normally performed by clinical staff). Physician's time may include the following activities when performed: Preparing to see the patient (e.g., pre-charting review of records, searching for previously ordered imaging, lab work, and nerve conduction tests) Review of prior analgesic  pharmacotherapies. Reviewing PMP Interpreting ordered tests (e.g., lab work, imaging, nerve conduction tests) Performing post-procedure evaluations, including interpretation of diagnostic procedures Obtaining and/or reviewing separately obtained history Performing a medically appropriate examination and/or evaluation Counseling and educating the patient/family/caregiver Ordering medications, tests, or procedures Referring and communicating with other health care professionals (when not separately reported) Documenting clinical information in the electronic or other health record Independently interpreting results (not separately reported) and communicating results to the patient/ family/caregiver Care coordination (not separately reported)  Note by: Candi Chafe, MD (TTS and AI technology used. I apologize for any typographical errors that were not detected and corrected.) Date: 05/26/2023; Time: 1:58 PM

## 2023-05-26 NOTE — Progress Notes (Signed)
 Safety precautions to be maintained throughout the outpatient stay will include: orient to surroundings, keep bed in low position, maintain call bell within reach at all times, provide assistance with transfer out of bed and ambulation.

## 2023-06-01 ENCOUNTER — Encounter: Payer: Self-pay | Admitting: Internal Medicine

## 2023-06-01 ENCOUNTER — Ambulatory Visit: Admitting: Internal Medicine

## 2023-06-01 VITALS — BP 104/70 | HR 74 | Temp 98.0°F | Resp 16 | Ht 62.0 in | Wt 124.0 lb

## 2023-06-01 DIAGNOSIS — D649 Anemia, unspecified: Secondary | ICD-10-CM

## 2023-06-01 DIAGNOSIS — E0590 Thyrotoxicosis, unspecified without thyrotoxic crisis or storm: Secondary | ICD-10-CM

## 2023-06-01 DIAGNOSIS — I2510 Atherosclerotic heart disease of native coronary artery without angina pectoris: Secondary | ICD-10-CM

## 2023-06-01 DIAGNOSIS — R5383 Other fatigue: Secondary | ICD-10-CM | POA: Diagnosis not present

## 2023-06-01 DIAGNOSIS — F419 Anxiety disorder, unspecified: Secondary | ICD-10-CM | POA: Diagnosis not present

## 2023-06-01 DIAGNOSIS — E538 Deficiency of other specified B group vitamins: Secondary | ICD-10-CM | POA: Diagnosis not present

## 2023-06-01 DIAGNOSIS — G4730 Sleep apnea, unspecified: Secondary | ICD-10-CM

## 2023-06-01 NOTE — Assessment & Plan Note (Signed)
 Cardiac cath 11/25/22 - Mild to moderate, nonobstructive coronary artery disease with 30-40% mid LAD stenosis.  No significant disease noted in the LMCA, LCx, or RCA.. Mildly reduced left ventricular systolic function with mid left ventricular wall motion abnormality most consistent with a Takotsubo variant. Normal left ventricular filling pressure. Recent echo improved as outlined.  Continues on aspirin . Taking jardiance .  Back on atorvastatin . Taking  toprol  12.5mg  in the evening. ECHO - 05/07/23 - EF 60-65% with no significant valve abnormality. No chest pain or sob reported. Staying active. Keep f/u with cardiology. Hold metoprolol  given increased fatigue. Follow.

## 2023-06-01 NOTE — Assessment & Plan Note (Signed)
 Recent cbc - hgb 03/2023 - wnl.

## 2023-06-01 NOTE — Assessment & Plan Note (Signed)
 Increased fatigue as outlined. Discussed may be multifactorial. She is exercising regularly. No chest pain or sob with increased exercise and activity. Discussed sleep apnea. Trying to use cpap, but having problems with the mask. Will contact pulmonary regarding f/u. Blood pressure today 104-108/68 with pulse checked by me 60-64. Recent ECHO - EF 60-65% with normal left ventricular diastolic parameters. Will hold metoprolol . See if fatigue improves. Address cpap issues. Recent labs ok. Follow. Keep f/u with cardiology.

## 2023-06-01 NOTE — Progress Notes (Signed)
 Cardiology Clinic Note   Date: 06/12/2023 ID: Pearl, Clamp [birth year], MRN [id number]  Primary Cardiologist:  Sammy Crisp, MD  Chief Complaint   Shannon [name] is a 58 y.o. female who presents to the clinic today for routine follow up.   Patient Profile   CHEYNNE MENASCO is followed by Dr. Nolan Battle for the history outlined below.      Past medical history significant for: CAD. LHC 11/25/2022 (NSTEMI): Mild to moderate nonobstructive CAD.  30 to 40% mid LAD.  Mildly reduced LV function with mid LV wall motion abnormality most consistent with Takotsubo. Recommend escalation of GDMT for nonischemic cardiomyopathy. Stress cardiomyopathy. Echo 11/25/2022: EF 45 to 50%.  Severe hypokinesis of the left ventricular, mid-- apical inferolateral wall.  Mild LVH.  Grade I DD.  Normal RV size and function.  No significant valvular abnormalities. Echo 05/07/2023: EF 60 to 65%.  No RWMA.  Normal diastolic parameters.  Normal global strain.  Normal RV size/function.  No significant valvular abnormalities. Hyperlipidemia. LPa 11/26/2022: 20.1. Lipid panel 04/09/2023: LDL 51, HDL 78, TG 90, total 147. GERD. Hyperthyroidism.  In summary, patient presented to the ED via EMS from PCP  in October 2024 with complaints of chest pain.  Pain described as constant in the epigastrium and radiating to shoulders relieved by Nexium .  EKG showed new inferolateral T wave inversions.  X-ray showed no active cardiopulmonary disease.  CTA chest abdomen pelvis showed no aortic aneurysm/dissection no acute process in chest abdomen pelvis.  Incidental finding of right thyroid  nodule and enlarged thyroid  measuring 2.2 cm.  Initial labs: Potassium 3.8, calcium  8.2, creatinine 0.53, hemoglobin 14.1, TSH 1.031.  Troponin 1798>> 2596.  Patient underwent heart catheterization which showed mild to moderate nonobstructive CAD and wall motion abnormalities consistent with Takotsubo.  Patient was discharged on 11/26/2022.  Unable to  add metoprolol  or Entresto secondary to soft BP.   Patient was seen in the office on 12/05/2022 for hospital follow-up.  Patient was doing well and interested in starting cardiac rehab.  She complained of mild medial upper arm discomfort since catheterization.  Her cath site was well-healed without drainage, erythema, edema.  2+ radial pulse.  She was instructed to contact the office if arm pain persisted.  She had not started atorvastatin  secondary to concern regarding side effects.  She was instructed to start atorvastatin  every other day to gauge tolerance and contact the office for consideration of rosuvastatin if not tolerating atorvastatin .   Patient was last seen in the office by me on 03/09/2023 for routine follow-up.  She was doing well and working out at the gym with her son-in-law who is a Systems analyst.  She reported continued epigastric discomfort which she described as "gas pain" that had been a longstanding issue for her and was relieved by belching.  She reported poor sleep hygiene with inability to stay asleep and was instructed to follow-up with PCP.  She reported a 64-month history of right calf discomfort and underwent right lower extremity venous ultrasound which was negative for DVT.  Repeat echo April 2025 showed normal LV/RV function as detailed above.  Patient presented to the ED on 06/11/2023 for chest pain. She reported central chest pain described as heaviness/burning beginning around midnight and improved somewhat with NTG x 1. Pain was almost completely resolved when she presented to the ED. EKG without acute changes. Troponin negative x 1. She was discharged home.      History of Present Illness  Today, patient is accompanied by her daughter. She reports an episode of chest pain that prompted an ED visit. She reports her CPAP settings were changed and wonders if that is what caused her episode of chest pain. She is having a hard time adjusting to CPAP. She has tried several  different masks. Her biggest complaint is fatigue. In March she discussed holding atorvastatin  with PCP. She held it for 2 weeks but unfortunately she had a bout of diverticulitis at the same time so she could not adequately tell if it made a difference so she restarted it. She states joint pain is increased but she is unsure if this is related to Lipitor.  She was also noted to have low blood sugar and Jardiance  was held. Since she stopped the Jardiance  blood sugar has remained <100 even after eating. She held Toprol  for 4 days to see if it improved fatigue but her resting heart rate increased to the 90s so she resumed taking it. She continues to walk most days of the week for exercise and works out 2 days a week.     ROS: All other systems reviewed and are otherwise negative except as noted in History of Present Illness.  EKGs/Labs Reviewed       EKG is no performed today. EKG from ED visit 06/11/2023 reviewed and demonstrates sinus bradycardia, 58 bpm, nonspecific T wave abnormality unchanged from previous.   04/21/2023: ALT 18; AST 18 06/11/2023: BUN 10; Creatinine, Ser 0.52; Potassium 4.0; Sodium 139   06/11/2023: Hemoglobin 14.5; WBC 7.0   11/24/2022: TSH 1.031    Physical Exam    VS:  BP 120/82 (BP Location: Left Arm, Patient Position: Sitting, Cuff Size: Normal)   Pulse 75   Resp 16   Ht 5\' 2"  (1.575 m)   Wt 124 lb 3.2 oz (56.3 kg)   LMP 02/12/2013   SpO2 96%   BMI 22.72 kg/m  , BMI Body mass index is 22.72 kg/m.  GEN: Well nourished, well developed, in no acute distress. Neck: No JVD or carotid bruits. Cardiac:  RRR. No murmurs. No rubs or gallops.   Respiratory:  Respirations regular and unlabored. Clear to auscultation without rales, wheezing or rhonchi. GI: Soft, nontender, nondistended. Extremities: Radials/DP/PT 2+ and equal bilaterally. No clubbing or cyanosis. No edema.  Skin: Warm and dry, no rash. Neuro: Strength intact.  Assessment & Plan   CAD LHC  11/25/2022 showed mild to moderate nonobstructive CAD and wall motion abnormalities consistent with Takotsubo.  Patient had an episode of chest pain yesterday prompting an ED visit with unremarkable workup. No chest pains since.  - No further workup indicated at this time.  -Continue aspirin , Toprol , atorvastatin , as needed SL NTG.   Stress cardiomyopathy Echo April 2025 showed normal LV/RV function, normal diastolic parameters, no significant valvular abnormalities.  Patient denies lower extremity edema, DOE, orthopnea or PND.  Euvolemic and well compensated on exam.  -Continue Toprol . Could not tolerate Jardiance  secondary to low blood sugar.    Hyperlipidemia LDL 51 March 2025, at goal. She reports trying to stop atorvastatin  in March to see if fatigue improved but she had a bout of diverticulitis so she is unsure if it improved her symptoms and she resumed taking it. She reports increased joint pain but is unsure if this is related to Lipitor.  - Hold Lipitor x 3 weeks to see if fatigue improves. MyChart with update. Consider trial of low dose rosuvastatin.  - Continue physical activity and following Mediterranean  diet.    OSA/Fatigue Patient reports continued fatigue. She has a history of poor sleep hygiene. She is having a hard time adjusting to CPAP. She is scared to not use, as her PCP told her she is at risk for heart attack if she does not use it. But using it is uncomfortable and will occasionally cause anxiety. She has tried several different masks. She discussed the dental device with pulmonology this week and is going to look further into it. Discussed untreated sleep apnea being a component of fatigue. Patient is not convinced as she was not experiencing fatigue before her hospital admission in October. She stopped Jardiance  secondary to low blood sugars (despite being off Jardiance ) which may be contributing to fatigue. She is encouraged to pursue further workup of low blood sugar with  PCP.  - Continue to use CPAP as tolerated.  - Continue to follow with pulmonology.   Disposition: Stop Jardiance . Hold Lipitor x 3 weeks. MyChart update. Return in 3-4 months or sooner as needed.          Signed, Lonell Rives. Sherronda Sweigert, DNP, NP-C

## 2023-06-01 NOTE — Assessment & Plan Note (Signed)
 S/p HST.  Using cpap. Has been adjusting/changing mask - trying to find something that works for her. Discussed f/u with pulmonary.  Wil contact pulmonary for f/u.

## 2023-06-01 NOTE — Progress Notes (Signed)
 Subjective:    Patient ID: Shannon [name], female    DOB: [birth year], 58 y.o.   MRN: [id number]  Patient here for  Chief Complaint  Patient presents with   Medication Management    HPI Here for a work in appt - work in with concerns regarding feeling "tired and exhausted". Recently evaluated in ER - treated for diverticulitis with augmentin . Admitted 10/2022 - NSTEMI - takotsubo cardiomyopathy - LHC 11/25/2022 showed mild to moderate nonobstructive CAD and wall motion abnormalities consistent with Takotsubo. Echo 11/25/2022 showed EF 45 to 50%, wall motion abnormalities, Grade I DD. Recent f/u ECHO 04/2023 - EF 60-65%, left ventricular diastolic parameters normal and no regional wall motion abnormality. She denies any chest pain or sob. Stays active. Is exercising. Walking 8-10,000 steps per day. Going to the gym 2x/week. Feels general fatigue - worse towards the end of the day. No abdominal pain. Keeping bowels moving. Having problems with cpap. May be averaging 3-4 hours per night recently. Does not tolerate the mask. Has tried several different masks. Still having issues.    Past Medical History:  Diagnosis Date   Anemia    Anxiety    Breast pain, right 11/07/2012   Breathing difficulty 06/20/2014   Environmental allergies    GERD (gastroesophageal reflux disease)    Hiatal hernia    small   History of migraine headaches    Hyperthyroidism    s/p ablation   Vaginitis 01/16/2013   Past Surgical History:  Procedure Laterality Date   BREAST BIOPSY Right 12/05/2015   neg   CESAREAN SECTION  11/98   Dr Wilburt Hands   CHOLECYSTECTOMY  11/05   Dr Amalia Badder   JOINT REPLACEMENT Left 2017   hip   LEFT HEART CATH AND CORONARY ANGIOGRAPHY N/A 11/25/2022   Procedure: LEFT HEART CATH AND CORONARY ANGIOGRAPHY;  Surgeon: Sammy Crisp, MD;  Location: [facility] INVASIVE CV LAB;  Service: Cardiovascular;  Laterality: N/A;   NASAL SINUS SURGERY  11/93   SEPTOPLASTY  5/90   Dr Ainsley Houston   TUBAL  LIGATION  02/1998   UMBILICAL HERNIA REPAIR  02/1998   Family History  Problem Relation Age of Onset   Heart disease Father    Hypertension Father    Thyroid  disease Father    Hypercholesterolemia Father    Kidney cancer Father    Leukemia Father        hairy cell   Hypercholesterolemia Mother    Thyroid  disease Mother    Breast cancer Mother 55   Breast cancer Other        maternal great grandmother   Heart disease Maternal Grandfather        MI - age 51   Prostate cancer Paternal Grandfather    Pancreatic cancer Paternal Grandfather    Lung cancer Paternal Grandfather    Bladder Cancer Neg Hx    Social History   Socioeconomic History   Marital status: Married    Spouse name: Not on file   Number of children: 1   Years of education: Not on file   Highest education level: 12th grade  Occupational History   Not on file  Tobacco Use   Smoking status: Former    Current packs/day: 0.00    Types: Cigarettes    Quit date: 01/28/2000    Years since quitting: 23.3   Smokeless tobacco: Never  Substance and Sexual Activity   Alcohol use: No    Alcohol/week: 0.0 standard drinks of alcohol  Drug use: No   Sexual activity: Not Currently    Partners: Male  Other Topics Concern   Not on file  Social History Narrative   Not on file   Social Drivers of Health   Financial Resource Strain: Low Risk  (03/17/2023)   Overall Financial Resource Strain (CARDIA)    Difficulty of Paying Living Expenses: Not hard at all  Food Insecurity: No Food Insecurity (03/17/2023)   Hunger Vital Sign    Worried About Running Out of Food in the Last Year: Never true    Ran Out of Food in the Last Year: Never true  Transportation Needs: No Transportation Needs (03/17/2023)   PRAPARE - Administrator, Civil Service (Medical): No    Lack of Transportation (Non-Medical): No  Physical Activity: Sufficiently Active (03/17/2023)   Exercise Vital Sign    Days of Exercise per Week: 5 days     Minutes of Exercise per Session: 40 min  Stress: No Stress Concern Present (03/17/2023)   [employer] of Occupational Health - Occupational Stress Questionnaire    Feeling of Stress : Only a little  Social Connections: Socially Integrated (03/17/2023)   Social Connection and Isolation Panel [NHANES]    Frequency of Communication with Friends and Family: More than three times a week    Frequency of Social Gatherings with Friends and Family: More than three times a week    Attends Religious Services: More than 4 times per year    Active Member of [employer] or Organizations: Yes    Attends Engineer, structural: More than 4 times per year    Marital Status: Married     Review of Systems  Constitutional:  Positive for fatigue. Negative for appetite change and unexpected weight change.  HENT:  Negative for congestion and sinus pressure.   Respiratory:  Negative for cough, chest tightness and shortness of breath.   Cardiovascular:  Negative for chest pain, palpitations and leg swelling.  Gastrointestinal:  Negative for abdominal pain, diarrhea, nausea and vomiting.  Genitourinary:  Negative for difficulty urinating and dysuria.  Musculoskeletal:  Negative for joint swelling and myalgias.  Skin:  Negative for color change and rash.  Neurological:  Negative for dizziness and headaches.  Psychiatric/Behavioral:  Negative for agitation and dysphoric mood.        Objective:     BP 104/70   Pulse 74   Temp 98 F (36.7 C)   Resp 16   Ht 5\' 2"  (1.575 m)   Wt 124 lb (56.2 kg)   LMP 02/12/2013   SpO2 98%   BMI 22.68 kg/m  Wt Readings from Last 3 Encounters:  06/01/23 124 lb (56.2 kg)  05/26/23 121 lb (54.9 kg)  05/14/23 122 lb 3.2 oz (55.4 kg)    Physical Exam Vitals reviewed.  Constitutional:      General: She is not in acute distress.    Appearance: Normal appearance.  HENT:     Head: Normocephalic and atraumatic.     Right Ear: External ear normal.     Left Ear:  External ear normal.     Mouth/Throat:     Pharynx: No oropharyngeal exudate or posterior oropharyngeal erythema.  Eyes:     General: No scleral icterus.       Right eye: No discharge.        Left eye: No discharge.     Conjunctiva/sclera: Conjunctivae normal.  Neck:     Thyroid : No thyromegaly.  Cardiovascular:  Rate and Rhythm: Normal rate and regular rhythm.  Pulmonary:     Effort: No respiratory distress.     Breath sounds: Normal breath sounds. No wheezing.  Abdominal:     General: Bowel sounds are normal.     Palpations: Abdomen is soft.     Tenderness: There is no abdominal tenderness.  Musculoskeletal:        General: No swelling or tenderness.     Cervical back: Neck supple. No tenderness.  Lymphadenopathy:     Cervical: No cervical adenopathy.  Skin:    Findings: No erythema or rash.  Neurological:     Mental Status: She is alert.  Psychiatric:        Mood and Affect: Mood normal.        Behavior: Behavior normal.         Outpatient Encounter Medications as of 06/01/2023  Medication Sig   acetaminophen  (TYLENOL ) 500 MG tablet Take 1,000 mg by mouth every 6 (six) hours as needed for moderate pain (pain score 4-6) or mild pain (pain score 1-3).   ALPRAZolam  (XANAX ) 0.25 MG tablet TAKE ONE TABLET DAILY IF NEEDED FOR ANXIETY   aspirin  EC 81 MG tablet Take 1 tablet (81 mg total) by mouth daily. Swallow whole.   atorvastatin  (LIPITOR) 10 MG tablet Take 1 tablet (10 mg total) by mouth daily.   cholecalciferol (VITAMIN D3) 25 MCG (1000 UNIT) tablet Take 1,000 Units by mouth daily.   Cyanocobalamin  1000 MCG/ML KIT Inject 1,000 mcg as directed every 30 (thirty) days.   EPINEPHrine 0.3 mg/0.3 mL IJ SOAJ injection Inject 0.3 mg into the muscle as needed for anaphylaxis.   JARDIANCE  10 MG TABS tablet TAKE ONE TABLET (10 MG) BY MOUTH ONCE DAILY   metoprolol  succinate (TOPROL  XL) 25 MG 24 hr tablet Take 0.5 tablets (12.5 mg total) by mouth daily. At bedtime   nitroGLYCERIN   (NITROSTAT ) 0.4 MG SL tablet Place 1 tablet (0.4 mg total) under the tongue every 5 (five) minutes as needed for chest pain.   omeprazole (PRILOSEC) 40 MG capsule Take 40 mg by mouth daily.   ondansetron  (ZOFRAN -ODT) 4 MG disintegrating tablet Take 1 tablet (4 mg total) by mouth every 8 (eight) hours as needed for nausea or vomiting.   UBRELVY 100 MG TABS Take by mouth.   [DISCONTINUED] oxyCODONE -acetaminophen  (PERCOCET/ROXICET) 5-325 MG tablet Take 1 tablet by mouth every 4 (four) hours as needed for severe pain (pain score 7-10).   No facility-administered encounter medications on file as of 06/01/2023.     Lab Results  Component Value Date   WBC 9.1 04/21/2023   HGB 14.2 04/21/2023   HCT 42.7 04/21/2023   PLT 237 04/21/2023   GLUCOSE 106 (H) 04/21/2023   CHOL 147 04/09/2023   TRIG 90.0 04/09/2023   HDL 77.50 04/09/2023   LDLCALC 51 04/09/2023   ALT 18 04/21/2023   AST 18 04/21/2023   NA 138 04/21/2023   K 3.7 04/21/2023   CL 104 04/21/2023   CREATININE 0.66 04/21/2023   BUN 10 04/21/2023   CO2 26 04/21/2023   TSH 1.031 11/24/2022   INR 1.0 11/24/2022    DG PAIN CLINIC C-ARM 1-60 MIN NO REPORT Result Date: 05/05/2023 Fluoro was used, but no Radiologist interpretation will be provided. Please refer to "NOTES" tab for provider progress note.      Assessment & Plan:  Other fatigue Assessment & Plan: Increased fatigue as outlined. Discussed may be multifactorial. She is exercising regularly. No chest pain  or sob with increased exercise and activity. Discussed sleep apnea. Trying to use cpap, but having problems with the mask. Will contact pulmonary regarding f/u. Blood pressure today 104-108/68 with pulse checked by me 60-64. Recent ECHO - EF 60-65% with normal left ventricular diastolic parameters. Will hold metoprolol . See if fatigue improves. Address cpap issues. Recent labs ok. Follow. Keep f/u with cardiology.    Anemia, unspecified type Assessment & Plan: Recent cbc -  hgb 03/2023 - wnl.    Anxiety Assessment & Plan: Overall stable. Follow.    B12 deficiency Assessment & Plan: Continue b12 injections.    Coronary artery disease involving native coronary artery of native heart without angina pectoris Assessment & Plan: Cardiac cath 11/25/22 - Mild to moderate, nonobstructive coronary artery disease with 30-40% mid LAD stenosis.  No significant disease noted in the LMCA, LCx, or RCA.. Mildly reduced left ventricular systolic function with mid left ventricular wall motion abnormality most consistent with a Takotsubo variant. Normal left ventricular filling pressure. Recent echo improved as outlined.  Continues on aspirin . Taking jardiance .  Back on atorvastatin . Taking  toprol  12.5mg  in the evening. ECHO - 05/07/23 - EF 60-65% with no significant valve abnormality. No chest pain or sob reported. Staying active. Keep f/u with cardiology. Hold metoprolol  given increased fatigue. Follow.    Hyperthyroidism Assessment & Plan: S/p ablation. TSH wnl 10/2022.    Sleep apnea, unspecified type Assessment & Plan: S/p HST.  Using cpap. Has been adjusting/changing mask - trying to find something that works for her. Discussed f/u with pulmonary.  Wil contact pulmonary for f/u.       Dellar Fenton, MD

## 2023-06-01 NOTE — Assessment & Plan Note (Signed)
Continue b12 injections.  

## 2023-06-01 NOTE — Patient Instructions (Signed)
 Hold metoprolol.

## 2023-06-01 NOTE — Assessment & Plan Note (Signed)
Overall stable.  Follow.  

## 2023-06-01 NOTE — Assessment & Plan Note (Signed)
 S/p ablation. TSH wnl 10/2022.

## 2023-06-02 DIAGNOSIS — J3089 Other allergic rhinitis: Secondary | ICD-10-CM | POA: Diagnosis not present

## 2023-06-02 DIAGNOSIS — J301 Allergic rhinitis due to pollen: Secondary | ICD-10-CM | POA: Diagnosis not present

## 2023-06-02 DIAGNOSIS — J3081 Allergic rhinitis due to animal (cat) (dog) hair and dander: Secondary | ICD-10-CM | POA: Diagnosis not present

## 2023-06-04 ENCOUNTER — Encounter: Payer: Self-pay | Admitting: Nurse Practitioner

## 2023-06-04 ENCOUNTER — Ambulatory Visit: Admitting: Nurse Practitioner

## 2023-06-04 ENCOUNTER — Ambulatory Visit: Payer: Self-pay

## 2023-06-04 ENCOUNTER — Encounter (HOSPITAL_COMMUNITY): Payer: Self-pay

## 2023-06-04 ENCOUNTER — Other Ambulatory Visit (HOSPITAL_COMMUNITY)
Admission: RE | Admit: 2023-06-04 | Discharge: 2023-06-04 | Disposition: A | Discharge status: Home / Self Care | Source: Ambulatory Visit | Attending: Pediatrics | Admitting: Pediatrics

## 2023-06-04 VITALS — BP 118/74 | HR 85 | Temp 97.8°F | Ht 62.0 in | Wt 127.4 lb

## 2023-06-04 DIAGNOSIS — N760 Acute vaginitis: Secondary | ICD-10-CM | POA: Diagnosis not present

## 2023-06-04 MED ORDER — NYSTATIN 100000 UNIT/GM EX OINT: 1.0000 | TOPICAL_OINTMENT | Freq: Two times a day (BID) | CUTANEOUS | 0 refills | Status: DC

## 2023-06-04 NOTE — Telephone Encounter (Signed)
  Chief Complaint: Rash to rectum and possible vaginal area Symptoms: small red bumps that are "fire red" Frequency: started yesterday Pertinent Negatives: Patient denies fever Disposition: [] ED /[] Urgent Care (no appt availability in office) / [x] Appointment(In office/virtual)/ []  [place] [facility]/ [] Home Care/ [] Refused Recommended Disposition /[] [place] [facility]/ []  Follow-up with PCP Additional Notes: patient calling with concerns of rash to rectal and possibly vaginal area. Patient endorses small red bumps on her"back end." Patient is on Jardiance  and is concern with burning sensation she is having with the rash and does report burning when she urinates. Patient states urine does look darker. Per protocol patient is recommended to be seen today. Appointment scheduled with another provider in office for 1:20 PM today. Patient verbalized understanding of the plan and appointment and all questions answered.    Copied from CRM [id number]. Topic: Clinical - Red Word Triage >> Jun 04, 2023 12:13 PM Kita Perish H wrote: [facility] that prompted transfer to Nurse Triage: Burning and discomfort front and back Reason for Disposition  [1] Looks infected (spreading redness, pus) AND [2] diabetes mellitus or weak immune system (e.g., HIV positive, cancer chemo, splenectomy, organ transplant, chronic steroids)  Answer Assessment - Initial Assessment Questions 1. APPEARANCE of RASH: "Describe the rash."      "Little red bumps"  2. LOCATION: "Where is the rash located?"      Vaginal and possible rectal area 3. NUMBER: "How many spots are there?"      numerous 4. SIZE: "How big are the spots?" (Inches, centimeters or compare to size of a coin)      Very small 5. ONSET: "When did the rash start?"      yesterday 6. ITCHING: "Does the rash itch?" If Yes, ask: "How bad is the itch?"  (Scale 0-10; or none, mild, moderate, severe)     No  7. PAIN: "Does the rash hurt?" If Yes, ask: "How bad is the  pain?"  (Scale 0-10; or none, mild, moderate, severe)    - NONE (0): no pain    - MILD (1-3): doesn't interfere with normal activities     - MODERATE (4-7): interferes with normal activities or awakens from sleep     - SEVERE (8-10): excruciating pain, unable to do any normal activities     3 out of 10 8. OTHER SYMPTOMS: "Do you have any other symptoms?" (e.g., fever)     no  Protocols used: Rash or Redness - Localized-A-AH

## 2023-06-04 NOTE — Progress Notes (Signed)
 Established Patient Office Visit  Subjective:  Patient ID: Shannon [name], female    DOB: Jan 09, 1966  Age: 58 y.o. MRN: [id number]  CC:  Chief Complaint  Patient presents with   Acute Visit    Pelvic & vaginal rash x 2 days Burning    HPI  Shannon [name] is a 58 year old female who presents with burning and irritation in the perineal area.  She experiences burning and irritation primarily in the posterior perineal region, with some anterior irritation. Symptoms began on Tuesday night and worsened by Thursday morning, especially during urination. There is no discharge or new sores. Desitin was applied last night and this morning to alleviate symptoms.  She has been on Jardiance  since October and questions if it is related to her symptoms. This is the first occurrence of these symptoms since starting the medication. She experiences frequent urination, which she attributes to increased water intake due to thirst.  Past Medical History:  Diagnosis Date   Anemia    Anxiety    Breast pain, right 11/07/2012   Breathing difficulty 06/20/2014   Environmental allergies    GERD (gastroesophageal reflux disease)    Hiatal hernia    small   History of migraine headaches    Hyperthyroidism    s/p ablation   Vaginitis 01/16/2013    Past Surgical History:  Procedure Laterality Date   BREAST BIOPSY Right 12/05/2015   neg   CESAREAN SECTION  11/98   Dr Wilburt Hands   CHOLECYSTECTOMY  11/05   Dr Amalia Badder   JOINT REPLACEMENT Left 2017   hip   LEFT HEART CATH AND CORONARY ANGIOGRAPHY N/A 11/25/2022   Procedure: LEFT HEART CATH AND CORONARY ANGIOGRAPHY;  Surgeon: Sammy Crisp, MD;  Location: [facility];  Service: Cardiovascular;  Laterality: N/A;   NASAL SINUS SURGERY  11/93   SEPTOPLASTY  5/90   Dr Ainsley Houston   TUBAL LIGATION  02/1998   UMBILICAL HERNIA REPAIR  02/1998    Family History  Problem Relation Age of Onset   Heart disease Father    Hypertension Father    Thyroid   disease Father    Hypercholesterolemia Father    Kidney cancer Father    Leukemia Father        hairy cell   Hypercholesterolemia Mother    Thyroid  disease Mother    Breast cancer Mother 5   Breast cancer Other        maternal great grandmother   Heart disease Maternal Grandfather        MI - age 53   Prostate cancer Paternal Grandfather    Pancreatic cancer Paternal Grandfather    Lung cancer Paternal Grandfather    Bladder Cancer Neg Hx     Social History   Socioeconomic History   Marital status: Married    Spouse name: Not on file   Number of children: 1   Years of education: Not on file   Highest education level: 12th grade  Occupational History   Not on file  Tobacco Use   Smoking status: Former    Current packs/day: 0.00    Types: Cigarettes    Quit date: 01/28/2000    Years since quitting: 23.4   Smokeless tobacco: Never  Substance and Sexual Activity   Alcohol use: No    Alcohol/week: 0.0 standard drinks of alcohol   Drug use: No   Sexual activity: Not Currently    Partners: Male  Other Topics Concern  Not on file  Social History Narrative   Not on file   Social Drivers of Health   Financial Resource Strain: Low Risk  (03/17/2023)   Overall Financial Resource Strain (CARDIA)    Difficulty of Paying Living Expenses: Not hard at all  Food Insecurity: No Food Insecurity (03/17/2023)   Hunger Vital Sign    Worried About Running Out of Food in the Last Year: Never true    Ran Out of Food in the Last Year: Never true  Transportation Needs: No Transportation Needs (03/17/2023)   PRAPARE - Administrator, Civil Service (Medical): No    Lack of Transportation (Non-Medical): No  Physical Activity: Sufficiently Active (03/17/2023)   Exercise Vital Sign    Days of Exercise per Week: 5 days    Minutes of Exercise per Session: 40 min  Stress: No Stress Concern Present (03/17/2023)   [employer] of Occupational Health - Occupational Stress  Questionnaire    Feeling of Stress : Only a little  Social Connections: Socially Integrated (03/17/2023)   Social Connection and Isolation Panel [NHANES]    Frequency of Communication with Friends and Family: More than three times a week    Frequency of Social Gatherings with Friends and Family: More than three times a week    Attends Religious Services: More than 4 times per year    Active Member of [employer] or Organizations: Yes    Attends Engineer, structural: More than 4 times per year    Marital Status: Married  Catering manager Violence: Not At Risk (11/24/2022)   Humiliation, Afraid, Rape, and Kick questionnaire    Fear of Current or Ex-Partner: No    Emotionally Abused: No    Physically Abused: No    Sexually Abused: No     Outpatient Medications Prior to Visit  Medication Sig Dispense Refill   acetaminophen  (TYLENOL ) 500 MG tablet Take 1,000 mg by mouth every 6 (six) hours as needed for moderate pain (pain score 4-6) or mild pain (pain score 1-3).     ALPRAZolam  (XANAX ) 0.25 MG tablet TAKE ONE TABLET DAILY IF NEEDED FOR ANXIETY 20 tablet 0   aspirin  EC 81 MG tablet Take 1 tablet (81 mg total) by mouth daily. Swallow whole. 30 tablet 12   atorvastatin  (LIPITOR) 10 MG tablet Take 1 tablet (10 mg total) by mouth daily. 90 tablet 3   cholecalciferol (VITAMIN D3) 25 MCG (1000 UNIT) tablet Take 1,000 Units by mouth daily.     Cyanocobalamin  1000 MCG/ML KIT Inject 1,000 mcg as directed every 30 (thirty) days.     EPINEPHrine 0.3 mg/0.3 mL IJ SOAJ injection Inject 0.3 mg into the muscle as needed for anaphylaxis.     metoprolol  succinate (TOPROL  XL) 25 MG 24 hr tablet Take 0.5 tablets (12.5 mg total) by mouth daily. At bedtime 45 tablet 3   nitroGLYCERIN  (NITROSTAT ) 0.4 MG SL tablet Place 1 tablet (0.4 mg total) under the tongue every 5 (five) minutes as needed for chest pain. 25 tablet 3   omeprazole (PRILOSEC) 40 MG capsule Take 40 mg by mouth daily.     ondansetron   (ZOFRAN -ODT) 4 MG disintegrating tablet Take 1 tablet (4 mg total) by mouth every 8 (eight) hours as needed for nausea or vomiting. 15 tablet 0   UBRELVY 100 MG TABS Take by mouth.     JARDIANCE  10 MG TABS tablet TAKE ONE TABLET (10 MG) BY MOUTH ONCE DAILY 90 tablet 1   No facility-administered medications  prior to visit.    Allergies  Allergen Reactions   Codeine Other (See Comments)    Severe Headache  Other Reaction(s): headache   Decongest-Aid [Pseudoephedrine] Other (See Comments)    Increased heart rate   Flagyl  [Metronidazole ]    Misc. Sulfonamide Containing Compounds Other (See Comments)   Sulfa Antibiotics     ROS Review of Systems Negative unless indicated in HPI.    Objective:     Physical Exam Constitutional:      Appearance: Normal appearance.  Cardiovascular:     Rate and Rhythm: Normal rate and regular rhythm.     Pulses: Normal pulses.     Heart sounds: Normal heart sounds.  Abdominal:     General: Bowel sounds are normal.     Palpations: Abdomen is soft.     Tenderness: There is no abdominal tenderness. There is no right CVA tenderness or left CVA tenderness.  Genitourinary:    Labia:        Right: No rash.        Left: No rash.      Comments: Slight erythema, Vaginal swab performed, pt tolerated well. Musculoskeletal:     Cervical back: Normal range of motion.  Neurological:     General: No focal deficit present.     Mental Status: She is alert. Mental status is at baseline.  Psychiatric:        Mood and Affect: Mood normal.        Behavior: Behavior normal.        Thought Content: Thought content normal.        Judgment: Judgment normal.     BP 118/74   Pulse 85   Temp 97.8 F (36.6 C)   Ht 5\' 2"  (1.575 m)   Wt 127 lb 6.4 oz (57.8 kg)   LMP 02/12/2013   SpO2 97%   BMI 23.30 kg/m  Wt Readings from Last 3 Encounters:  06/12/23 124 lb 3.2 oz (56.3 kg)  06/11/23 120 lb (54.4 kg)  06/09/23 125 lb (56.7 kg)     Health Maintenance   Topic Date Due   HIV Screening  Never done   Hepatitis C Screening  Never done   COVID-19 Vaccine (4 - 2024-25 season) 09/28/2022   Zoster Vaccines- Shingrix (1 of 2) 08/13/2023 (Originally [birth year]/12/1982)   Pneumococcal Vaccine 13-53 Years old (1 of 2 - PCV) 05/13/2024 (Originally [birth year]12/1982)   INFLUENZA VACCINE  08/28/2023   MAMMOGRAM  10/23/2023   Cervical Cancer Screening (HPV/Pap Cotest)  05/08/2027   Colonoscopy  08/26/2032   HPV VACCINES  Aged Out   Meningococcal B Vaccine  Aged Out   DTaP/Tdap/Td  Discontinued    There are no preventive care reminders to display for this patient.  Lab Results  Component Value Date   TSH 1.031 11/24/2022   Lab Results  Component Value Date   WBC 7.0 06/11/2023   HGB 14.5 06/11/2023   HCT 44.2 06/11/2023   MCV 93.1 06/11/2023   PLT 231 06/11/2023   Lab Results  Component Value Date   NA 139 06/11/2023   K 4.0 06/11/2023   CO2 26 06/11/2023   GLUCOSE 98 06/11/2023   BUN 10 06/11/2023   CREATININE 0.52 06/11/2023   BILITOT 0.7 04/21/2023   ALKPHOS 60 04/21/2023   AST 18 04/21/2023   ALT 18 04/21/2023   PROT 6.8 04/21/2023   ALBUMIN 3.8 04/21/2023   CALCIUM  9.2 06/11/2023   ANIONGAP 9 06/11/2023   GFR 88.46 04/09/2023  Lab Results  Component Value Date   CHOL 147 04/09/2023   Lab Results  Component Value Date   HDL 77.50 04/09/2023   Lab Results  Component Value Date   LDLCALC 51 04/09/2023   Lab Results  Component Value Date   TRIG 90.0 04/09/2023   Lab Results  Component Value Date   CHOLHDL 2 04/09/2023   No results found for: "HGBA1C"    Assessment & Plan:  Vulvovaginitis Assessment & Plan: Burning and irritation to the grain area, possible yeast infection due to Jardiance  use. - Perform swab for yeast infection. - Prescribe nystatin  for affected areas. - Instruct to apply nystatin  to rectal and vaginal areas.  Orders: -     Cervicovaginal ancillary only  Other orders -     Nystatin ; Apply 1  Application topically 2 (two) times daily.  Dispense: 30 g; Refill: 0    Follow-up: No follow-ups on file.   Marino Rogerson, NP

## 2023-06-04 NOTE — Telephone Encounter (Signed)
 Pt scheduled with NP at 1:20

## 2023-06-08 ENCOUNTER — Encounter: Payer: Self-pay | Admitting: Internal Medicine

## 2023-06-08 LAB — CERVICOVAGINAL ANCILLARY ONLY
Bacterial Vaginitis (gardnerella): NEGATIVE
Candida Glabrata: NEGATIVE
Candida Vaginitis: POSITIVE — AB
Chlamydia: NEGATIVE
Comment: NEGATIVE
Comment: NEGATIVE
Comment: NEGATIVE
Comment: NEGATIVE
Comment: NEGATIVE
Comment: NORMAL
Neisseria Gonorrhea: NEGATIVE
Trichomonas: NEGATIVE

## 2023-06-08 NOTE — Telephone Encounter (Signed)
 Spoke with pt and she stated that she is eating regular meals. She stated that the only symptom she had when her bs dropped to 72 was weakness. Pt also wanted to let you know that she has been diagnosed with a yeast infection and she has read that this can be caused by the Jardiance . She would also like to know if you would be willing to write her a letter to get her out of jury duty.

## 2023-06-08 NOTE — Telephone Encounter (Signed)
 Remain off jardiance . Call with update. Letter printed for jury duty.

## 2023-06-08 NOTE — Telephone Encounter (Signed)
 Confirm she is eating regular meals. Agree with starting back on metoprolol . She has a f/u appt with cardiology this week. She can hold her jardiance  temporarily and see if symptoms improve.  Monitor sugars. See if had any symptoms when bs was 72. She is not diabetic. Make sure eating protein.

## 2023-06-08 NOTE — Telephone Encounter (Signed)
 Copied from CRM [id number]. Topic: General - Other >> Jun 08, 2023  3:11 PM Chuck Crater wrote: Reason for CRM: Patient is requesting a call from [place].

## 2023-06-08 NOTE — Telephone Encounter (Signed)
 LM for patient. Please  relay message when she returns call.

## 2023-06-09 ENCOUNTER — Ambulatory Visit: Admitting: Sleep Medicine

## 2023-06-09 ENCOUNTER — Encounter: Payer: Self-pay | Admitting: Sleep Medicine

## 2023-06-09 ENCOUNTER — Encounter: Payer: Self-pay | Admitting: Internal Medicine

## 2023-06-09 VITALS — BP 110/70 | HR 72 | Temp 98.2°F | Ht 62.0 in | Wt 125.0 lb

## 2023-06-09 DIAGNOSIS — J3089 Other allergic rhinitis: Secondary | ICD-10-CM | POA: Diagnosis not present

## 2023-06-09 DIAGNOSIS — I2510 Atherosclerotic heart disease of native coronary artery without angina pectoris: Secondary | ICD-10-CM

## 2023-06-09 DIAGNOSIS — G4733 Obstructive sleep apnea (adult) (pediatric): Secondary | ICD-10-CM

## 2023-06-09 DIAGNOSIS — Z87891 Personal history of nicotine dependence: Secondary | ICD-10-CM | POA: Diagnosis not present

## 2023-06-09 DIAGNOSIS — J301 Allergic rhinitis due to pollen: Secondary | ICD-10-CM | POA: Diagnosis not present

## 2023-06-09 DIAGNOSIS — J3081 Allergic rhinitis due to animal (cat) (dog) hair and dander: Secondary | ICD-10-CM | POA: Diagnosis not present

## 2023-06-09 NOTE — Progress Notes (Signed)
 Name:Shannon [name] MRN: [id number] DOB: [birth year]   CHIEF COMPLAINT:  CPAP F/U   HISTORY OF PRESENT ILLNESS:  Mrs. Shade is a 58 y.o. w/ a h/o OSA, CAD, DMII, GERD and hyperlipidemia who presents for CPAP f/u. Reports difficulty using CPAP therapy the entire night due to mask leaks and discomfort. He is currently using the A[employer]ouch F20 FFM. States that she's also tried several nasal masks. Reports also using a mouth guard at night for teeth grinding. States that she awakens frequently during the night to adjust the mask.     EPWORTH SLEEP SCORE    03/23/2023    1:00 PM  Results of the Epworth flowsheet  Sitting and reading 0  Watching TV 2  Sitting, inactive in a public place (e.g. a theatre or a meeting) 0  As a passenger in a car for an hour without a break 0  Lying down to rest in the afternoon when circumstances permit 0  Sitting and talking to someone 0  Sitting quietly after a lunch without alcohol 0  In a car, while stopped for a few minutes in traffic 0  Total score 2     PAST MEDICAL HISTORY :   has a past medical history of Anemia, Anxiety, Breast pain, right (11/07/2012), Breathing difficulty (06/20/2014), Environmental allergies, GERD (gastroesophageal reflux disease), Hiatal hernia, History of migraine headaches, Hyperthyroidism, and Vaginitis (01/16/2013).  has a past surgical history that includes Septoplasty (5/90); Nasal sinus surgery (11/93); Cesarean section (11/98); Tubal ligation (02/1998); Umbilical hernia repair (02/1998); Cholecystectomy (11/05); Joint replacement (Left, 2017); Breast biopsy (Right, 12/05/2015); and LEFT HEART CATH AND CORONARY ANGIOGRAPHY (N/A, 11/25/2022). Prior to Admission medications   Medication Sig Start Date End Date Taking? Authorizing Provider  acetaminophen  (TYLENOL ) 500 MG tablet Take 1,000 mg by mouth every 6 (six) hours as needed for moderate pain (pain score 4-6) or mild pain (pain score 1-3).   Yes [provider]  ALPRAZolam  (XANAX ) 0.25 MG tablet TAKE ONE TABLET DAILY IF NEEDED FOR ANXIETY 01/08/23  Yes Dellar Fenton, MD  aspirin  EC 81 MG tablet Take 1 tablet (81 mg total) by mouth daily. Swallow whole. 11/27/22  Yes Luna Salinas, MD  atorvastatin  (LIPITOR) 10 MG tablet Take 1 tablet (10 mg total) by mouth daily. 05/14/23  Yes Dellar Fenton, MD  cholecalciferol (VITAMIN D3) 25 MCG (1000 UNIT) tablet Take 1,000 Units by mouth daily.   Yes [provider]  Cyanocobalamin  1000 MCG/ML KIT Inject 1,000 mcg as directed every 30 (thirty) days.   Yes [provider]  EPINEPHrine 0.3 mg/0.3 mL IJ SOAJ injection Inject 0.3 mg into the muscle as needed for anaphylaxis.   Yes [provider]  JARDIANCE  10 MG TABS tablet TAKE ONE TABLET (10 MG) BY MOUTH ONCE DAILY 04/28/23  Yes Dellar Fenton, MD  metoprolol  succinate (TOPROL  XL) 25 MG 24 hr tablet Take 0.5 tablets (12.5 mg total) by mouth daily. At bedtime 12/05/22  Yes Wittenborn, Bernardo Bridgeman, NP  nitroGLYCERIN  (NITROSTAT ) 0.4 MG SL tablet Place 1 tablet (0.4 mg total) under the tongue every 5 (five) minutes as needed for chest pain. 11/26/22  Yes Gollan, Timothy J, MD  nystatin  ointment (MYCOSTATIN ) Apply 1 Application topically 2 (two) times daily. 06/04/23  Yes Kaur, Charanpreet, NP  omeprazole (PRILOSEC) 40 MG capsule Take 40 mg by mouth daily.   Yes [provider]  ondansetron  (ZOFRAN -ODT) 4 MG disintegrating tablet Take 1 tablet (4 mg total) by mouth every 8 (  eight) hours as needed for nausea or vomiting. 04/21/23  Yes Norlene Beavers, MD  UBRELVY 100 MG TABS Take by mouth. 02/20/23  Yes [provider]   Allergies  Allergen Reactions   Codeine Other (See Comments)    Severe Headache  Other Reaction(s): headache   Decongest-Aid [Pseudoephedrine] Other (See Comments)    Increased heart rate   Flagyl  [Metronidazole ]    Misc. Sulfonamide Containing Compounds Other (See Comments)   Sulfa Antibiotics      FAMILY HISTORY:  family history includes Breast cancer in an other family member; Breast cancer (age of onset: 18) in her mother; Heart disease in her father and maternal grandfather; Hypercholesterolemia in her father and mother; Hypertension in her father; Kidney cancer in her father; Leukemia in her father; Lung cancer in her paternal grandfather; Pancreatic cancer in her paternal grandfather; Prostate cancer in her paternal grandfather; Thyroid  disease in her father and mother. SOCIAL HISTORY:  reports that she quit smoking about 23 years ago. Her smoking use included cigarettes. She has never used smokeless tobacco. She reports that she does not drink alcohol and does not use drugs.   Review of Systems:  Gen:  Denies  fever, sweats, chills weight loss  HEENT: Denies blurred vision, double vision, ear pain, eye pain, hearing loss, nose bleeds, sore throat Cardiac:  No dizziness, chest pain or heaviness, chest tightness,edema, No JVD Resp:   No cough, -sputum production, -shortness of breath,-wheezing, -hemoptysis,  Gi: Denies swallowing difficulty, stomach pain, nausea or vomiting, diarrhea, constipation, bowel incontinence Gu:  Denies bladder incontinence, burning urine Ext:   Denies Joint pain, stiffness or swelling Skin: Denies  skin rash, easy bruising or bleeding or hives Endoc:  Denies polyuria, polydipsia , polyphagia or weight change Psych:   Denies depression, insomnia or hallucinations  Other:  All other systems negative  VITAL SIGNS: BP 110/70 (BP Location: Left Arm, Patient Position: Sitting, Cuff Size: Normal)   Pulse 72   Temp 98.2 F (36.8 C) (Oral)   Ht 5\' 2"  (1.575 m)   Wt 125 lb (56.7 kg)   LMP 02/12/2013   SpO2 96%   BMI 22.86 kg/m    Physical Examination:   General Appearance: No distress  EYES PERRLA, EOM intact.   NECK Supple, No JVD Pulmonary: normal breath sounds, No wheezing.  CardiovascularNormal S1,S2.  No m/r/g.   Abdomen: Benign, Soft,  non-tender. Skin:   warm, no rashes, no ecchymosis  Extremities: normal, no cyanosis, clubbing. Neuro:without focal findings,  speech normal  PSYCHIATRIC: Mood, affect within normal limits.   ASSESSMENT AND PLAN  OSA For air leaks, advised patient to try using OTC mouth tape. Also advised her to try using the CPAP mask without night guard to see if air leaks improve. Discussed the consequences of untreated sleep apnea. Advised not to drive drowsy for safety of patient and others. If air leaks and discomfort does not resolve, will consider Inspire therapy and refer to ENT.    CAD Stable, on current management. Following with Cardiology.    Patient  satisfied with Plan of action and management. All questions answered  I spent a total of 33 minutes reviewing chart data, face-to-face evaluation with the patient, counseling and coordination of care as detailed above.    Tahani Potier, M.D.  Sleep Medicine [facility]

## 2023-06-09 NOTE — Telephone Encounter (Signed)
 See other note sent to patient.

## 2023-06-09 NOTE — Telephone Encounter (Signed)
 Please see previous note. Letter has been typed and placed in box (Trisha's desk)

## 2023-06-10 DIAGNOSIS — G4733 Obstructive sleep apnea (adult) (pediatric): Secondary | ICD-10-CM | POA: Diagnosis not present

## 2023-06-11 ENCOUNTER — Telehealth: Payer: Self-pay | Admitting: Internal Medicine

## 2023-06-11 ENCOUNTER — Emergency Department
Admission: EM | Admit: 2023-06-11 | Discharge: 2023-06-11 | Disposition: A | Discharge status: Home / Self Care | Attending: Emergency Medicine | Admitting: Emergency Medicine

## 2023-06-11 ENCOUNTER — Other Ambulatory Visit: Payer: Self-pay

## 2023-06-11 ENCOUNTER — Ambulatory Visit

## 2023-06-11 ENCOUNTER — Emergency Department

## 2023-06-11 ENCOUNTER — Encounter: Payer: Self-pay | Admitting: Emergency Medicine

## 2023-06-11 DIAGNOSIS — R079 Chest pain, unspecified: Secondary | ICD-10-CM | POA: Diagnosis not present

## 2023-06-11 DIAGNOSIS — R0789 Other chest pain: Secondary | ICD-10-CM | POA: Diagnosis not present

## 2023-06-11 DIAGNOSIS — R072 Precordial pain: Secondary | ICD-10-CM | POA: Diagnosis not present

## 2023-06-11 LAB — BASIC METABOLIC PANEL WITH GFR
Anion gap: 9 (ref 5–15)
BUN: 10 mg/dL (ref 6–20)
CO2: 26 mmol/L (ref 22–32)
Calcium: 9.2 mg/dL (ref 8.9–10.3)
Chloride: 104 mmol/L (ref 98–111)
Creatinine, Ser: 0.52 mg/dL (ref 0.44–1.00)
GFR, Estimated: >60 mL/min (ref >60)
Glucose, Bld: 98 mg/dL (ref 70–99)
Potassium: 4 mmol/L (ref 3.5–5.1)
Sodium: 139 mmol/L (ref 135–145)

## 2023-06-11 LAB — CBC
HCT: 44.2 % (ref 36.0–46.0)
Hemoglobin: 14.5 g/dL (ref 12.0–15.0)
MCH: 30.5 pg (ref 26.0–34.0)
MCHC: 32.8 g/dL (ref 30.0–36.0)
MCV: 93.1 fL (ref 80.0–100.0)
Platelets: 231 10*3/uL (ref 150–400)
RBC: 4.75 MIL/uL (ref 3.87–5.11)
RDW: 12.5 % (ref 11.5–15.5)
WBC: 7 10*3/uL (ref 4.0–10.5)
nRBC: 0 % (ref 0.0–0.2)

## 2023-06-11 LAB — TROPONIN I (HIGH SENSITIVITY): Troponin I (High Sensitivity): 4 ng/L (ref <18)

## 2023-06-11 NOTE — Telephone Encounter (Signed)
 Called patient - she is still experiencing some tightness after taking nitroglycerin  - advised patient to go to ED for evaluation - patient verbalized understanding and agreement with plan

## 2023-06-11 NOTE — Telephone Encounter (Signed)
 Pt c/o of Chest Pain: STAT if active (IN THIS MOMENT) CP, including tightness, pressure, jaw pain, shoulder/upper arm/back pain, SOB, nausea, and vomiting.  1. Are you having CP right now (tightness, pressure, or discomfort)? No  2. Are you experiencing any other symptoms (ex. SOB, nausea, vomiting, sweating)? No  3. How long have you been experiencing CP? Since 4:30 am   4. Is your CP continuous or coming and going? Coming and going   5. Have you taken Nitroglycerin ? Yes  6. If CP returns before callback, please consider calling 911. ?

## 2023-06-11 NOTE — ED Notes (Signed)
 Steady gait, denies questions or needs, declines w/c, out to husband in w/r, encouraged close f/u and to keep cards appt tomorrow

## 2023-06-11 NOTE — ED Triage Notes (Signed)
 Patient to ED via POV for centralized CP that started this AM. PT took 1 nitroglycerin  at home which helped somewhat. Hx MI

## 2023-06-11 NOTE — ED Notes (Signed)
 Pt alert, NAD, calm, interactive, resps e/u, speaking in clear complete sentences. VSS. NSR HR 75. EDP at [facility].

## 2023-06-11 NOTE — ED Provider Notes (Signed)
 [facility] Provider Note   Event Date/Time   First MD Initiated Contact with Patient 06/11/23 1103     (approximate) History  Chest Pain  HPI Shannon [name] is a 58 y.o. female with a past medical history of ACS who presents after substernal chest pain that she describes as a heaviness/burning and began around midnight last night.  The patient states that this pain did not improve by 430 and she took a nitroglycerin  that did improve her pain.  Patient states the pain is almost completely resolved at this time with current 2/10 in severity.  Patient denies any associated dyspnea on exertion, diaphoresis, or worsening pain with exertion ROS: Patient currently denies any vision changes, tinnitus, difficulty speaking, facial droop, sore throat, shortness of breath, abdominal pain, nausea/vomiting/diarrhea, dysuria, or weakness/numbness/paresthesias in any extremity   Physical Exam  Triage Vital Signs: ED Triage Vitals  Encounter Vitals Group     BP 06/11/23 1050 (!) 148/79     Systolic BP Percentile --      Diastolic BP Percentile --      Pulse Rate 06/11/23 1050 68     Resp 06/11/23 1050 17     Temp 06/11/23 1052 98.3 F (36.8 C)     Temp src --      SpO2 06/11/23 1050 100 %     Weight 06/11/23 1050 120 lb (54.4 kg)     Height 06/11/23 1050 5\' 2"  (1.575 m)     Head Circumference --      Peak Flow --      Pain Score 06/11/23 1050 2     Pain Loc --      Pain Education --      Exclude from Growth Chart --    Most recent vital signs: Vitals:   06/11/23 1110 06/11/23 1115  BP:    Pulse: 78 70  Resp: 13 20  Temp:    SpO2: 99% 99%   General: Awake, oriented x4. CV:  Good peripheral perfusion.  No MGR Resp:  Normal effort.  CTAB Abd:  No distention.  Other:  Middle-aged well-developed, well-nourished Caucasian female resting comfortably in no acute distress ED Results / Procedures / Treatments  Labs (all labs ordered are listed, but only abnormal  results are displayed) Labs Reviewed  CBC  BASIC METABOLIC PANEL WITH GFR  TROPONIN I (HIGH SENSITIVITY)   EKG ED ECG REPORT I, Charleen Conn, the attending physician, personally viewed and interpreted this ECG. Date: 06/11/2023 EKG Time: 1052 Rate: 58 Rhythm: normal sinus rhythm QRS Axis: normal Intervals: normal ST/T Wave abnormalities: normal Narrative Interpretation: no evidence of acute ischemia RADIOLOGY ED MD interpretation: 2 view chest x-ray interpreted by me shows no evidence of acute abnormalities including no pneumonia, pneumothorax, or widened mediastinum -Agree with radiology assessment Official radiology report(s): DG Chest 2 View Result Date: 06/11/2023 CLINICAL DATA:  Chest pain. EXAM: CHEST - 2 VIEW COMPARISON:  11/24/2022. FINDINGS: Bilateral lung fields are clear. Bilateral costophrenic angles are clear. Normal cardio-mediastinal silhouette. No acute osseous abnormalities. The soft tissues are within normal limits. IMPRESSION: No active cardiopulmonary disease. Electronically Signed   By: Beula Brunswick M.D.   On: 06/11/2023 11:07   PROCEDURES: Critical Care performed: No .1-3 Lead EKG Interpretation  Performed by: Charleen Conn, MD Authorized by: Charleen Conn, MD     Interpretation: normal     ECG rate:  71   ECG rate assessment: normal     Rhythm:  sinus rhythm     Ectopy: none     Conduction: normal    MEDICATIONS ORDERED IN ED: Medications - No data to display IMPRESSION / MDM / ASSESSMENT AND PLAN / ED COURSE  I reviewed the triage vital signs and the nursing notes.                             The patient is on the cardiac monitor to evaluate for evidence of arrhythmia and/or significant heart rate changes. Patient's presentation is most consistent with acute presentation with potential threat to life or bodily function. 58 year old female with a history of ACS presents for 6 hours of substernal chest pain that is improved after  nitroglycerin  Workup: ECG, CXR, CBC, BMP, Troponin Findings: ECG: No overt evidence of STEMI. No evidence of Brugada's sign, delta wave, epsilon wave, significantly prolonged QTc, or malignant arrhythmia HS Troponin: Negative x1 Other Labs unremarkable for emergent problems. CXR: Without PTX, PNA, or widened mediastinum Last Heart Catheterization:  11/25/2022 HEART Score: 4  Given History, Exam, and Workup I have low suspicion for ACS, Pneumothorax, Pneumonia, Pulmonary Embolus, Tamponade, Aortic Dissection or other emergent problem as a cause for this presentation.   Reassesment: Prior to discharge patient's pain was controlled and they were well appearing.  Disposition:  Discharge. Strict return precautions discussed with patient with full understanding. Advised patient to follow up promptly with primary care provider    FINAL CLINICAL IMPRESSION(S) / ED DIAGNOSES   Final diagnoses:  Nonspecific chest pain   Rx / DC Orders   ED Discharge Orders     None      Note:  This document was prepared using Dragon voice recognition software and may include unintentional dictation errors.   Davona Kinoshita K, MD 06/11/23 1116

## 2023-06-12 ENCOUNTER — Ambulatory Visit: Payer: BC Managed Care – PPO | Attending: Student | Admitting: Student

## 2023-06-12 ENCOUNTER — Encounter: Payer: Self-pay | Admitting: Student

## 2023-06-12 VITALS — BP 120/82 | HR 75 | Resp 16 | Ht 62.0 in | Wt 124.2 lb

## 2023-06-12 DIAGNOSIS — I5181 Takotsubo syndrome: Secondary | ICD-10-CM

## 2023-06-12 DIAGNOSIS — E785 Hyperlipidemia, unspecified: Secondary | ICD-10-CM

## 2023-06-12 DIAGNOSIS — I2510 Atherosclerotic heart disease of native coronary artery without angina pectoris: Secondary | ICD-10-CM | POA: Diagnosis not present

## 2023-06-12 DIAGNOSIS — R5383 Other fatigue: Secondary | ICD-10-CM

## 2023-06-12 DIAGNOSIS — G4733 Obstructive sleep apnea (adult) (pediatric): Secondary | ICD-10-CM | POA: Diagnosis not present

## 2023-06-12 NOTE — Patient Instructions (Signed)
 Medication Instructions:  Your physician has recommended you make the following change in your medication:   STOP Jardiance  Hold Atorvastatin  (Lipitor) for 3 weeks and then My Chart how you are feeling.   *If you need a refill on your cardiac medications before your next appointment, please call your pharmacy*  Lab Work: None  If you have labs (blood work) drawn today and your tests are completely normal, you will receive your results only by: MyChart Message (if you have MyChart) OR A paper copy in the mail If you have any lab test that is abnormal or we need to change your treatment, we will call you to review the results.  Testing/Procedures: None  Follow-Up: At [facility], you and your health needs are our priority.  As part of our continuing mission to provide you with exceptional heart care, our providers are all part of one team.  This team includes your primary Cardiologist (physician) and Advanced Practice Providers or APPs (Physician Assistants and Nurse Practitioners) who all work together to provide you with the care you need, when you need it.  Your next appointment:   3 month(s)  Provider:   Sammy Crisp, MD

## 2023-06-15 ENCOUNTER — Encounter: Payer: Self-pay | Admitting: Internal Medicine

## 2023-06-15 DIAGNOSIS — R519 Headache, unspecified: Secondary | ICD-10-CM

## 2023-06-15 NOTE — Telephone Encounter (Signed)
 Spoke with patient to let her know that you are out of the office and confirm doing ok. Patient has a b12 shot tomorrow and was wondering if she could have the ESR and sed rate done when she comes in. Pt is aware if any change in symptoms, needs to be evaluated ASAP.

## 2023-06-15 NOTE — Telephone Encounter (Signed)
 Ok to schedule ESR and CRP when she comes in for B12 injection.  Dx headache.

## 2023-06-16 ENCOUNTER — Ambulatory Visit (INDEPENDENT_AMBULATORY_CARE_PROVIDER_SITE_OTHER)

## 2023-06-16 ENCOUNTER — Telehealth: Payer: Self-pay

## 2023-06-16 ENCOUNTER — Other Ambulatory Visit: Payer: Self-pay

## 2023-06-16 DIAGNOSIS — J3089 Other allergic rhinitis: Secondary | ICD-10-CM | POA: Diagnosis not present

## 2023-06-16 DIAGNOSIS — J3081 Allergic rhinitis due to animal (cat) (dog) hair and dander: Secondary | ICD-10-CM | POA: Diagnosis not present

## 2023-06-16 DIAGNOSIS — J301 Allergic rhinitis due to pollen: Secondary | ICD-10-CM | POA: Diagnosis not present

## 2023-06-16 DIAGNOSIS — E538 Deficiency of other specified B group vitamins: Secondary | ICD-10-CM | POA: Diagnosis not present

## 2023-06-16 DIAGNOSIS — G4733 Obstructive sleep apnea (adult) (pediatric): Secondary | ICD-10-CM

## 2023-06-16 DIAGNOSIS — R519 Headache, unspecified: Secondary | ICD-10-CM | POA: Diagnosis not present

## 2023-06-16 MED ORDER — CYANOCOBALAMIN 1000 MCG/ML IJ SOLN
1000.0000 ug | Freq: Once | INTRAMUSCULAR | Status: AC
Start: 2023-06-16 — End: 2023-06-16
  Administered 2023-06-16: 1000 ug via INTRAMUSCULAR

## 2023-06-16 NOTE — Progress Notes (Signed)
 Patient was administered a B12 injection into her right deltoid. Patient tolerated the injection well.

## 2023-06-16 NOTE — Telephone Encounter (Signed)
 Copied from CRM [id number]. Topic: General - Other >> Jun 16, 2023 10:18 AM Shannon [name] wrote: Reason for CRM: pt would like Dr Reddy's nurse to send her a message, so she can reply to it and tell what she what needs.  Pt cannot send Dr Kieran Pellet a message, but she can reply to one.

## 2023-06-16 NOTE — Telephone Encounter (Signed)
 BiPAP titration order has been placed per Dr. Kieran Pellet

## 2023-06-17 ENCOUNTER — Ambulatory Visit: Payer: Self-pay | Admitting: Internal Medicine

## 2023-06-17 LAB — C-REACTIVE PROTEIN: CRP: <1 mg/dL (ref 0.5–20.0)

## 2023-06-17 LAB — SEDIMENTATION RATE: Sed Rate: 9 mm/h (ref 0–30)

## 2023-06-20 DIAGNOSIS — N760 Acute vaginitis: Secondary | ICD-10-CM | POA: Insufficient documentation

## 2023-06-20 NOTE — Assessment & Plan Note (Signed)
 Burning and irritation to the grain area, possible yeast infection due to Jardiance  use. - Perform swab for yeast infection. - Prescribe nystatin  for affected areas. - Instruct to apply nystatin  to rectal and vaginal areas.

## 2023-06-23 DIAGNOSIS — J3089 Other allergic rhinitis: Secondary | ICD-10-CM | POA: Diagnosis not present

## 2023-06-23 DIAGNOSIS — H0012 Chalazion right lower eyelid: Secondary | ICD-10-CM | POA: Diagnosis not present

## 2023-06-23 DIAGNOSIS — J301 Allergic rhinitis due to pollen: Secondary | ICD-10-CM | POA: Diagnosis not present

## 2023-06-23 DIAGNOSIS — J3081 Allergic rhinitis due to animal (cat) (dog) hair and dander: Secondary | ICD-10-CM | POA: Diagnosis not present

## 2023-06-25 ENCOUNTER — Ambulatory Visit: Admitting: Internal Medicine

## 2023-06-25 VITALS — BP 118/72 | HR 74 | Temp 98.0°F | Resp 16 | Ht 62.0 in | Wt 125.4 lb

## 2023-06-25 DIAGNOSIS — R519 Headache, unspecified: Secondary | ICD-10-CM

## 2023-06-25 DIAGNOSIS — R5383 Other fatigue: Secondary | ICD-10-CM | POA: Diagnosis not present

## 2023-06-25 DIAGNOSIS — F419 Anxiety disorder, unspecified: Secondary | ICD-10-CM | POA: Diagnosis not present

## 2023-06-25 DIAGNOSIS — I2510 Atherosclerotic heart disease of native coronary artery without angina pectoris: Secondary | ICD-10-CM | POA: Diagnosis not present

## 2023-06-25 DIAGNOSIS — K219 Gastro-esophageal reflux disease without esophagitis: Secondary | ICD-10-CM

## 2023-06-25 DIAGNOSIS — G4730 Sleep apnea, unspecified: Secondary | ICD-10-CM

## 2023-06-25 DIAGNOSIS — E0590 Thyrotoxicosis, unspecified without thyrotoxic crisis or storm: Secondary | ICD-10-CM

## 2023-06-25 NOTE — Progress Notes (Signed)
 Subjective:    Patient ID: Shannon [name], female    DOB: 08/04/1965, 58 y.o.   MRN: [id number]  Patient here for  Chief Complaint  Patient presents with   Headache    HPI Here for a work in appt - work in to discuss headaches. Saw cardiology 06/12/23. Recommended continuing aspirin  and toprol . Off jardiance . Had problems with low blood sugar. Given some joint discomfort, recommended holding lipitor for 3 weeks to see if symptoms improved. She reports she does feel some better regarding the fatigue. Energy is some better being off these medications. She is still exercising. Walking. Going to the gym 2x/week. She is not using cpap now. Planning to change to BiPAP. She is having increased pain - right corner of eye and extending out laterally. Saw optometrist. Placed on abx/steroid drops for a "swollen gland" in her eye. She is to use the drops and f/u in 2 weeks. No other headache. Has had issues with TMJ. Has been clenching her teeth some with the increased stress. No acid reflux, but does report increased gas - belching at times. No known triggers. No abdominal pain or bowel change reported.    Past Medical History:  Diagnosis Date   Anemia    Anxiety    Breast pain, right 11/07/2012   Breathing difficulty 06/20/2014   Environmental allergies    GERD (gastroesophageal reflux disease)    Hiatal hernia    small   History of migraine headaches    Hyperthyroidism    s/p ablation   Vaginitis 01/16/2013   Past Surgical History:  Procedure Laterality Date   BREAST BIOPSY Right 12/05/2015   neg   CESAREAN SECTION  11/98   Dr Wilburt Hands   CHOLECYSTECTOMY  11/05   Dr Amalia Badder   JOINT REPLACEMENT Left 2017   hip   LEFT HEART CATH AND CORONARY ANGIOGRAPHY N/A 11/25/2022   Procedure: LEFT HEART CATH AND CORONARY ANGIOGRAPHY;  Surgeon: Sammy Crisp, MD;  Location: [facility] INVASIVE CV LAB;  Service: Cardiovascular;  Laterality: N/A;   NASAL SINUS SURGERY  11/93   SEPTOPLASTY  5/90   Dr  Ainsley Houston   TUBAL LIGATION  02/1998   UMBILICAL HERNIA REPAIR  02/1998   Family History  Problem Relation Age of Onset   Heart disease Father    Hypertension Father    Thyroid  disease Father    Hypercholesterolemia Father    Kidney cancer Father    Leukemia Father        hairy cell   Hypercholesterolemia Mother    Thyroid  disease Mother    Breast cancer Mother 43   Breast cancer Other        maternal great grandmother   Heart disease Maternal Grandfather        MI - age 57   Prostate cancer Paternal Grandfather    Pancreatic cancer Paternal Grandfather    Lung cancer Paternal Grandfather    Bladder Cancer Neg Hx    Social History   Socioeconomic History   Marital status: Married    Spouse name: Not on file   Number of children: 1   Years of education: Not on file   Highest education level: 12th grade  Occupational History   Not on file  Tobacco Use   Smoking status: Former    Current packs/day: 0.00    Types: Cigarettes    Quit date: 01/28/2000    Years since quitting: 23.4   Smokeless tobacco: Never  Substance and Sexual Activity  Alcohol use: No    Alcohol/week: 0.0 standard drinks of alcohol   Drug use: No   Sexual activity: Not Currently    Partners: Male  Other Topics Concern   Not on file  Social History Narrative   Not on file   Social Drivers of Health   Financial Resource Strain: Low Risk  (03/17/2023)   Overall Financial Resource Strain (CARDIA)    Difficulty of Paying Living Expenses: Not hard at all  Food Insecurity: No Food Insecurity (03/17/2023)   Hunger Vital Sign    Worried About Running Out of Food in the Last Year: Never true    Ran Out of Food in the Last Year: Never true  Transportation Needs: No Transportation Needs (03/17/2023)   PRAPARE - Administrator, Civil Service (Medical): No    Lack of Transportation (Non-Medical): No  Physical Activity: Sufficiently Active (03/17/2023)   Exercise Vital Sign    Days of Exercise per  Week: 5 days    Minutes of Exercise per Session: 40 min  Stress: No Stress Concern Present (03/17/2023)   [employer] of Occupational Health - Occupational Stress Questionnaire    Feeling of Stress : Only a little  Social Connections: Socially Integrated (03/17/2023)   Social Connection and Isolation Panel [NHANES]    Frequency of Communication with Friends and Family: More than three times a week    Frequency of Social Gatherings with Friends and Family: More than three times a week    Attends Religious Services: More than 4 times per year    Active Member of [employer] or Organizations: Yes    Attends Engineer, structural: More than 4 times per year    Marital Status: Married     Review of Systems  Constitutional:  Negative for appetite change and fever.       Fatigue is some better.   HENT:  Negative for congestion and sinus pressure.   Eyes:  Positive for pain. Negative for photophobia and redness.  Respiratory:  Negative for cough, chest tightness and shortness of breath.   Cardiovascular:  Negative for chest pain, palpitations and leg swelling.  Gastrointestinal:  Negative for abdominal pain, diarrhea, nausea and vomiting.  Genitourinary:  Negative for difficulty urinating and dysuria.  Musculoskeletal:  Negative for joint swelling and myalgias.  Skin:  Negative for color change and rash.  Neurological:  Negative for dizziness.       Pain - starts in corner of eye and extends out. No other headache.   Psychiatric/Behavioral:  Negative for agitation and dysphoric mood.        Objective:     BP 118/72   Pulse 74   Temp 98 F (36.7 C)   Resp 16   Ht 5\' 2"  (1.575 m)   Wt 125 lb 6.4 oz (56.9 kg)   LMP 02/12/2013   SpO2 98%   BMI 22.94 kg/m  Wt Readings from Last 3 Encounters:  06/25/23 125 lb 6.4 oz (56.9 kg)  06/12/23 124 lb 3.2 oz (56.3 kg)  06/11/23 120 lb (54.4 kg)    Physical Exam Vitals reviewed.  Constitutional:      General: She is not in acute  distress.    Appearance: Normal appearance.  HENT:     Head: Normocephalic and atraumatic.     Right Ear: External ear normal.     Left Ear: External ear normal.  Eyes:     General: No scleral icterus.  Right eye: No discharge.        Left eye: No discharge.     Conjunctiva/sclera: Conjunctivae normal.     Comments: No redness.   Neck:     Thyroid : No thyromegaly.  Cardiovascular:     Rate and Rhythm: Normal rate and regular rhythm.  Pulmonary:     Effort: No respiratory distress.     Breath sounds: Normal breath sounds. No wheezing.  Abdominal:     General: Bowel sounds are normal.     Palpations: Abdomen is soft.     Tenderness: There is no abdominal tenderness.  Musculoskeletal:        General: No swelling or tenderness.     Cervical back: Neck supple. No tenderness.  Lymphadenopathy:     Cervical: No cervical adenopathy.  Skin:    Findings: No erythema or rash.  Neurological:     Mental Status: She is alert.  Psychiatric:        Mood and Affect: Mood normal.        Behavior: Behavior normal.         Outpatient Encounter Medications as of 06/25/2023  Medication Sig   neomycin-polymyxin b-dexamethasone  (MAXITROL) 3.5-10000-0.1 SUSP SMARTSIG:In Eye(s)   acetaminophen  (TYLENOL ) 500 MG tablet Take 1,000 mg by mouth every 6 (six) hours as needed for moderate pain (pain score 4-6) or mild pain (pain score 1-3).   ALPRAZolam  (XANAX ) 0.25 MG tablet TAKE ONE TABLET DAILY IF NEEDED FOR ANXIETY   aspirin  EC 81 MG tablet Take 1 tablet (81 mg total) by mouth daily. Swallow whole.   atorvastatin  (LIPITOR) 10 MG tablet Take 1 tablet (10 mg total) by mouth daily. (Patient not taking: Reported on 06/25/2023)   cholecalciferol (VITAMIN D3) 25 MCG (1000 UNIT) tablet Take 1,000 Units by mouth daily.   Cyanocobalamin  1000 MCG/ML KIT Inject 1,000 mcg as directed every 30 (thirty) days.   EPINEPHrine 0.3 mg/0.3 mL IJ SOAJ injection Inject 0.3 mg into the muscle as needed for  anaphylaxis.   metoprolol  succinate (TOPROL  XL) 25 MG 24 hr tablet Take 0.5 tablets (12.5 mg total) by mouth daily. At bedtime   nitroGLYCERIN  (NITROSTAT ) 0.4 MG SL tablet Place 1 tablet (0.4 mg total) under the tongue every 5 (five) minutes as needed for chest pain.   nystatin  ointment (MYCOSTATIN ) Apply 1 Application topically 2 (two) times daily.   omeprazole (PRILOSEC) 40 MG capsule Take 40 mg by mouth daily.   ondansetron  (ZOFRAN -ODT) 4 MG disintegrating tablet Take 1 tablet (4 mg total) by mouth every 8 (eight) hours as needed for nausea or vomiting.   UBRELVY 100 MG TABS Take by mouth.   No facility-administered encounter medications on file as of 06/25/2023.     Lab Results  Component Value Date   WBC 7.0 06/11/2023   HGB 14.5 06/11/2023   HCT 44.2 06/11/2023   PLT 231 06/11/2023   GLUCOSE 98 06/11/2023   CHOL 147 04/09/2023   TRIG 90.0 04/09/2023   HDL 77.50 04/09/2023   LDLCALC 51 04/09/2023   ALT 18 04/21/2023   AST 18 04/21/2023   NA 139 06/11/2023   K 4.0 06/11/2023   CL 104 06/11/2023   CREATININE 0.52 06/11/2023   BUN 10 06/11/2023   CO2 26 06/11/2023   TSH 1.031 11/24/2022   INR 1.0 11/24/2022    DG Chest 2 View Result Date: 06/11/2023 CLINICAL DATA:  Chest pain. EXAM: CHEST - 2 VIEW COMPARISON:  11/24/2022. FINDINGS: Bilateral lung fields are clear. Bilateral costophrenic  angles are clear. Normal cardio-mediastinal silhouette. No acute osseous abnormalities. The soft tissues are within normal limits. IMPRESSION: No active cardiopulmonary disease. Electronically Signed   By: Beula Brunswick M.D.   On: 06/11/2023 11:07       Assessment & Plan:  Nonintractable headache, unspecified chronicity pattern, unspecified headache type Assessment & Plan: Actually describes the pain - located in the corner of her right eye and pain extends out from her eye. No other headache. ESR and CRP wnl. Saw her eye doctor. Being treated for a swollen gland in her eye. Has planned  f/u. Will notify me if persistent.    Anxiety Assessment & Plan: Overall appears to be relatively stable. Follow.    Coronary artery disease involving native coronary artery of native heart without angina pectoris Assessment & Plan: Cardiac cath 11/25/22 - Mild to moderate, nonobstructive coronary artery disease with 30-40% mid LAD stenosis.  No significant disease noted in the LMCA, LCx, or RCA.. Mildly reduced left ventricular systolic function with mid left ventricular wall motion abnormality most consistent with a Takotsubo variant. Normal left ventricular filling pressure. Recent echo improved as outlined.  Continues on aspirin . Was on jardiance . Off now due to low blood sugars.  Also off atorvastatin . Taking  toprol  12.5mg  in the evening. ECHO - 05/07/23 - EF 60-65% with no significant valve abnormality. No chest pain or sob reported. Staying active. Feeling some better. Follow.    Other fatigue Assessment & Plan: Off jardiance  and lipitor now. Feeling better. Call with update. Consider changing to low dose crestor.    Gastroesophageal reflux disease, unspecified whether esophagitis present Assessment & Plan: Continue PPI as she is doing. No upper symptoms reported.    Hyperthyroidism Assessment & Plan: S/p ablation. TSH wnl 10/2022.    Sleep apnea, unspecified type Assessment & Plan: S/p HST.  Was trying to use cpap. Has been adjusting/changing mask - trying to find something that works for her. Discussed f/u with pulmonary.  Planning to change to Bipap. Follow.       Dellar Fenton, MD

## 2023-06-28 ENCOUNTER — Encounter: Payer: Self-pay | Admitting: Internal Medicine

## 2023-06-28 DIAGNOSIS — R519 Headache, unspecified: Secondary | ICD-10-CM | POA: Insufficient documentation

## 2023-06-28 NOTE — Assessment & Plan Note (Signed)
 Actually describes the pain - located in the corner of her right eye and pain extends out from her eye. No other headache. ESR and CRP wnl. Saw her eye doctor. Being treated for a swollen gland in her eye. Has planned f/u. Will notify me if persistent.

## 2023-06-28 NOTE — Assessment & Plan Note (Signed)
 S/p ablation. TSH wnl 10/2022.

## 2023-06-28 NOTE — Assessment & Plan Note (Signed)
 Cardiac cath 11/25/22 - Mild to moderate, nonobstructive coronary artery disease with 30-40% mid LAD stenosis.  No significant disease noted in the LMCA, LCx, or RCA.. Mildly reduced left ventricular systolic function with mid left ventricular wall motion abnormality most consistent with a Takotsubo variant. Normal left ventricular filling pressure. Recent echo improved as outlined.  Continues on aspirin . Was on jardiance . Off now due to low blood sugars.  Also off atorvastatin . Taking  toprol  12.5mg  in the evening. ECHO - 05/07/23 - EF 60-65% with no significant valve abnormality. No chest pain or sob reported. Staying active. Feeling some better. Follow.

## 2023-06-28 NOTE — Assessment & Plan Note (Signed)
 S/p HST.  Was trying to use cpap. Has been adjusting/changing mask - trying to find something that works for her. Discussed f/u with pulmonary.  Planning to change to Bipap. Follow.

## 2023-06-28 NOTE — Assessment & Plan Note (Signed)
 Continue PPI as she is doing. No upper symptoms reported.

## 2023-06-28 NOTE — Assessment & Plan Note (Signed)
 Off jardiance  and lipitor now. Feeling better. Call with update. Consider changing to low dose crestor.

## 2023-06-28 NOTE — Assessment & Plan Note (Signed)
 Overall appears to be relatively stable. Follow.

## 2023-06-30 DIAGNOSIS — J3081 Allergic rhinitis due to animal (cat) (dog) hair and dander: Secondary | ICD-10-CM | POA: Diagnosis not present

## 2023-06-30 DIAGNOSIS — J3089 Other allergic rhinitis: Secondary | ICD-10-CM | POA: Diagnosis not present

## 2023-06-30 DIAGNOSIS — J301 Allergic rhinitis due to pollen: Secondary | ICD-10-CM | POA: Diagnosis not present

## 2023-07-07 DIAGNOSIS — J301 Allergic rhinitis due to pollen: Secondary | ICD-10-CM | POA: Diagnosis not present

## 2023-07-07 DIAGNOSIS — J3081 Allergic rhinitis due to animal (cat) (dog) hair and dander: Secondary | ICD-10-CM | POA: Diagnosis not present

## 2023-07-07 DIAGNOSIS — J3089 Other allergic rhinitis: Secondary | ICD-10-CM | POA: Diagnosis not present

## 2023-07-08 DIAGNOSIS — G5603 Carpal tunnel syndrome, bilateral upper limbs: Secondary | ICD-10-CM | POA: Diagnosis not present

## 2023-07-11 DIAGNOSIS — G4733 Obstructive sleep apnea (adult) (pediatric): Secondary | ICD-10-CM | POA: Diagnosis not present

## 2023-07-13 ENCOUNTER — Ambulatory Visit: Payer: Self-pay | Admitting: Internal Medicine

## 2023-07-13 ENCOUNTER — Ambulatory Visit (INDEPENDENT_AMBULATORY_CARE_PROVIDER_SITE_OTHER)

## 2023-07-13 DIAGNOSIS — E559 Vitamin D deficiency, unspecified: Secondary | ICD-10-CM

## 2023-07-13 DIAGNOSIS — E0590 Thyrotoxicosis, unspecified without thyrotoxic crisis or storm: Secondary | ICD-10-CM | POA: Diagnosis not present

## 2023-07-13 DIAGNOSIS — E041 Nontoxic single thyroid nodule: Secondary | ICD-10-CM

## 2023-07-13 DIAGNOSIS — E538 Deficiency of other specified B group vitamins: Secondary | ICD-10-CM

## 2023-07-13 DIAGNOSIS — Z13220 Encounter for screening for lipoid disorders: Secondary | ICD-10-CM

## 2023-07-13 LAB — CBC WITH DIFFERENTIAL/PLATELET
Basophils Absolute: 0 10*3/uL (ref 0.0–0.1)
Basophils Relative: 0.6 % (ref 0.0–3.0)
Eosinophils Absolute: 0 10*3/uL (ref 0.0–0.7)
Eosinophils Relative: 0.7 % (ref 0.0–5.0)
HCT: 41.2 % (ref 36.0–46.0)
Hemoglobin: 13.8 g/dL (ref 12.0–15.0)
Lymphocytes Relative: 27.7 % (ref 12.0–46.0)
Lymphs Abs: 1.9 10*3/uL (ref 0.7–4.0)
MCHC: 33.5 g/dL (ref 30.0–36.0)
MCV: 91.3 fl (ref 78.0–100.0)
Monocytes Absolute: 0.5 10*3/uL (ref 0.1–1.0)
Monocytes Relative: 7.3 % (ref 3.0–12.0)
Neutro Abs: 4.3 10*3/uL (ref 1.4–7.7)
Neutrophils Relative %: 63.7 % (ref 43.0–77.0)
Platelets: 277 10*3/uL (ref 150.0–400.0)
RBC: 4.51 Mil/uL (ref 3.87–5.11)
RDW: 13.9 % (ref 11.5–15.5)
WBC: 6.8 10*3/uL (ref 4.0–10.5)

## 2023-07-13 LAB — LIPID PANEL
Cholesterol: 209 mg/dL — ABNORMAL HIGH (ref 0–200)
HDL: 70.6 mg/dL (ref >39.00)
LDL Cholesterol: 111 mg/dL — ABNORMAL HIGH (ref 0–99)
NonHDL: 137.98
Total CHOL/HDL Ratio: 3
Triglycerides: 134 mg/dL (ref 0.0–149.0)
VLDL: 26.8 mg/dL (ref 0.0–40.0)

## 2023-07-13 LAB — BASIC METABOLIC PANEL WITH GFR
BUN: 13 mg/dL (ref 6–23)
CO2: 26 meq/L (ref 19–32)
Calcium: 9.5 mg/dL (ref 8.4–10.5)
Chloride: 106 meq/L (ref 96–112)
Creatinine, Ser: 0.56 mg/dL (ref 0.40–1.20)
GFR: 99.61 mL/min (ref >60.00)
Glucose, Bld: 85 mg/dL (ref 70–99)
Potassium: 4.1 meq/L (ref 3.5–5.1)
Sodium: 141 meq/L (ref 135–145)

## 2023-07-13 LAB — HEPATIC FUNCTION PANEL
ALT: 23 U/L (ref 0–35)
AST: 18 U/L (ref 0–37)
Albumin: 4.4 g/dL (ref 3.5–5.2)
Alkaline Phosphatase: 63 U/L (ref 39–117)
Bilirubin, Direct: 0.1 mg/dL (ref 0.0–0.3)
Total Bilirubin: 0.6 mg/dL (ref 0.2–1.2)
Total Protein: 6.8 g/dL (ref 6.0–8.3)

## 2023-07-13 MED ORDER — CYANOCOBALAMIN 1000 MCG/ML IJ SOLN
1000.0000 ug | Freq: Once | INTRAMUSCULAR | Status: AC
  Administered 2023-07-13: 1000 ug via INTRAMUSCULAR

## 2023-07-13 NOTE — Progress Notes (Signed)
 Patient presented for B 12 injection to left deltoid, patient voiced no concerns nor showed any signs of distress during injection.

## 2023-07-13 NOTE — Addendum Note (Signed)
 Addended by: Thressa Flora D on: 07/13/2023 09:22 AM   Modules accepted: Orders

## 2023-07-14 ENCOUNTER — Other Ambulatory Visit

## 2023-07-14 DIAGNOSIS — J3089 Other allergic rhinitis: Secondary | ICD-10-CM | POA: Diagnosis not present

## 2023-07-14 DIAGNOSIS — J3081 Allergic rhinitis due to animal (cat) (dog) hair and dander: Secondary | ICD-10-CM | POA: Diagnosis not present

## 2023-07-14 DIAGNOSIS — R1013 Epigastric pain: Secondary | ICD-10-CM | POA: Diagnosis not present

## 2023-07-14 DIAGNOSIS — R143 Flatulence: Secondary | ICD-10-CM | POA: Diagnosis not present

## 2023-07-14 DIAGNOSIS — J301 Allergic rhinitis due to pollen: Secondary | ICD-10-CM | POA: Diagnosis not present

## 2023-07-14 DIAGNOSIS — R140 Abdominal distension (gaseous): Secondary | ICD-10-CM | POA: Diagnosis not present

## 2023-07-14 DIAGNOSIS — R0789 Other chest pain: Secondary | ICD-10-CM | POA: Diagnosis not present

## 2023-07-14 LAB — VITAMIN D 25 HYDROXY (VIT D DEFICIENCY, FRACTURES): Vit D, 25-Hydroxy: 25.5 ng/mL — ABNORMAL LOW (ref 30.0–100.0)

## 2023-07-15 ENCOUNTER — Telehealth: Payer: Self-pay | Admitting: Sleep Medicine

## 2023-07-15 NOTE — Telephone Encounter (Signed)
 Copied from CRM [id number]. Topic: General - Other >> Jul 13, 2023  3:37 PM [place] S wrote: Reason for CRM: sleep lab is calling to inform provider the sleepy study was denied.

## 2023-07-17 ENCOUNTER — Encounter: Admitting: Internal Medicine

## 2023-07-20 ENCOUNTER — Ambulatory Visit: Admitting: Sleep Medicine

## 2023-07-21 DIAGNOSIS — J301 Allergic rhinitis due to pollen: Secondary | ICD-10-CM | POA: Diagnosis not present

## 2023-07-21 DIAGNOSIS — J3081 Allergic rhinitis due to animal (cat) (dog) hair and dander: Secondary | ICD-10-CM | POA: Diagnosis not present

## 2023-07-21 DIAGNOSIS — J3089 Other allergic rhinitis: Secondary | ICD-10-CM | POA: Diagnosis not present

## 2023-07-21 NOTE — Telephone Encounter (Signed)
 I called the patient's [employer] plan and they stated the code provided to them was 04189 which is regular sleep study or split night sleep study. I told [employer] Dr. Jess was ordering Cpap-Bipap-ASV Titration Study [phone]. They stated that would make sense. I then called [facility] letting them know they needed to resubmit with the correct code [id number] to get the insurance approval

## 2023-07-23 DIAGNOSIS — H0012 Chalazion right lower eyelid: Secondary | ICD-10-CM | POA: Diagnosis not present

## 2023-07-23 DIAGNOSIS — H11153 Pinguecula, bilateral: Secondary | ICD-10-CM | POA: Diagnosis not present

## 2023-07-27 ENCOUNTER — Ambulatory Visit (INDEPENDENT_AMBULATORY_CARE_PROVIDER_SITE_OTHER): Admitting: Internal Medicine

## 2023-07-27 ENCOUNTER — Encounter: Payer: Self-pay | Admitting: Internal Medicine

## 2023-07-27 ENCOUNTER — Other Ambulatory Visit (HOSPITAL_COMMUNITY)
Admission: RE | Admit: 2023-07-27 | Discharge: 2023-07-27 | Disposition: A | Discharge status: Home / Self Care | Source: Ambulatory Visit | Attending: Internal Medicine | Admitting: Internal Medicine

## 2023-07-27 VITALS — BP 118/68 | HR 60 | Temp 98.0°F | Resp 16 | Ht 62.0 in | Wt 123.4 lb

## 2023-07-27 DIAGNOSIS — R5383 Other fatigue: Secondary | ICD-10-CM

## 2023-07-27 DIAGNOSIS — Z13220 Encounter for screening for lipoid disorders: Secondary | ICD-10-CM | POA: Diagnosis not present

## 2023-07-27 DIAGNOSIS — Z124 Encounter for screening for malignant neoplasm of cervix: Secondary | ICD-10-CM

## 2023-07-27 DIAGNOSIS — E0590 Thyrotoxicosis, unspecified without thyrotoxic crisis or storm: Secondary | ICD-10-CM | POA: Diagnosis not present

## 2023-07-27 DIAGNOSIS — E7800 Pure hypercholesterolemia, unspecified: Secondary | ICD-10-CM

## 2023-07-27 DIAGNOSIS — Z1231 Encounter for screening mammogram for malignant neoplasm of breast: Secondary | ICD-10-CM | POA: Diagnosis not present

## 2023-07-27 DIAGNOSIS — G4730 Sleep apnea, unspecified: Secondary | ICD-10-CM

## 2023-07-27 DIAGNOSIS — K219 Gastro-esophageal reflux disease without esophagitis: Secondary | ICD-10-CM

## 2023-07-27 DIAGNOSIS — I2510 Atherosclerotic heart disease of native coronary artery without angina pectoris: Secondary | ICD-10-CM

## 2023-07-27 DIAGNOSIS — Z0000 Encounter for general adult medical examination without abnormal findings: Secondary | ICD-10-CM | POA: Diagnosis not present

## 2023-07-27 DIAGNOSIS — I5181 Takotsubo syndrome: Secondary | ICD-10-CM

## 2023-07-27 MED ORDER — ROSUVASTATIN CALCIUM 5 MG PO TABS: ORAL_TABLET | ORAL | 1 refills | Status: DC

## 2023-07-27 NOTE — Progress Notes (Addendum)
 Subjective:    Patient ID: Shannon [name], female    DOB: 09/07/1965, 58 y.o.   MRN: [id number]  Patient here for  Chief Complaint  Patient presents with   Annual Exam    HPI Here for a physical exam. Evaluated by GI - acute visit - atypical chest pain.  Recommended hydrogen breath test and EGD. Also recommended starting protonix . Stop omeprazole. Off jardiance  - low sugars. Trial off lipitor to see if helped with ongoing symptoms. She does reports since stopping cholesterol medication and stopping jardiance  - she feels better. Upper GI symptoms better on protonix . Planning for EGD 07/2023. Still staying active. Exercise. Breathing overall stable. Discussed f/u with pulmonary - f/u sleep apnea - bipap trial. No abdominal pain or bowel change reported.    Past Medical History:  Diagnosis Date   Anemia    Anxiety    Breast pain, right 11/07/2012   Breathing difficulty 06/20/2014   Environmental allergies    GERD (gastroesophageal reflux disease)    Hiatal hernia    small   History of migraine headaches    Hyperthyroidism    s/p ablation   Vaginitis 01/16/2013   Past Surgical History:  Procedure Laterality Date   BREAST BIOPSY Right 12/05/2015   neg   CESAREAN SECTION  11/98   Dr Anette Molt   CHOLECYSTECTOMY  11/05   Dr Lorrene   JOINT REPLACEMENT Left 2017   hip   LEFT HEART CATH AND CORONARY ANGIOGRAPHY N/A 11/25/2022   Procedure: LEFT HEART CATH AND CORONARY ANGIOGRAPHY;  Surgeon: Mady Bruckner, MD;  Location: [facility];  Service: Cardiovascular;  Laterality: N/A;   NASAL SINUS SURGERY  11/93   SEPTOPLASTY  5/90   Dr Maceo   TUBAL LIGATION  02/1998   UMBILICAL HERNIA REPAIR  02/1998   Family History  Problem Relation Age of Onset   Heart disease Father    Hypertension Father    Thyroid  disease Father    Hypercholesterolemia Father    Kidney cancer Father    Leukemia Father        hairy cell   Hypercholesterolemia Mother    Thyroid  disease Mother     Breast cancer Mother 42   Breast cancer Other        maternal great grandmother   Heart disease Maternal Grandfather        MI - age 52   Prostate cancer Paternal Grandfather    Pancreatic cancer Paternal Grandfather    Lung cancer Paternal Grandfather    Bladder Cancer Neg Hx    Social History   Socioeconomic History   Marital status: Married    Spouse name: Not on file   Number of children: 1   Years of education: Not on file   Highest education level: 12th grade  Occupational History   Not on file  Tobacco Use   Smoking status: Former    Current packs/day: 0.00    Types: Cigarettes    Quit date: 01/28/2000    Years since quitting: 23.5   Smokeless tobacco: Never  Substance and Sexual Activity   Alcohol use: No    Alcohol/week: 0.0 standard drinks of alcohol   Drug use: No   Sexual activity: Not Currently    Partners: Male  Other Topics Concern   Not on file  Social History Narrative   Not on file   Social Drivers of Health   Financial Resource Strain: Low Risk  (03/17/2023)   Overall Financial  Resource Strain (CARDIA)    Difficulty of Paying Living Expenses: Not hard at all  Food Insecurity: No Food Insecurity (03/17/2023)   Hunger Vital Sign    Worried About Running Out of Food in the Last Year: Never true    Ran Out of Food in the Last Year: Never true  Transportation Needs: No Transportation Needs (03/17/2023)   PRAPARE - Administrator, Civil Service (Medical): No    Lack of Transportation (Non-Medical): No  Physical Activity: Sufficiently Active (03/17/2023)   Exercise Vital Sign    Days of Exercise per Week: 5 days    Minutes of Exercise per Session: 40 min  Stress: No Stress Concern Present (03/17/2023)   [employer] of Occupational Health - Occupational Stress Questionnaire    Feeling of Stress : Only a little  Social Connections: Socially Integrated (03/17/2023)   Social Connection and Isolation Panel    Frequency of Communication with  Friends and Family: More than three times a week    Frequency of Social Gatherings with Friends and Family: More than three times a week    Attends Religious Services: More than 4 times per year    Active Member of [employer] or Organizations: Yes    Attends Engineer, structural: More than 4 times per year    Marital Status: Married     Review of Systems  Constitutional:  Negative for appetite change and unexpected weight change.  HENT:  Negative for congestion, sinus pressure and sore throat.   Eyes:  Negative for pain and visual disturbance.  Respiratory:  Negative for cough, chest tightness and shortness of breath.   Cardiovascular:  Negative for chest pain, palpitations and leg swelling.  Gastrointestinal:  Negative for abdominal pain, diarrhea, nausea and vomiting.  Genitourinary:  Negative for difficulty urinating and dysuria.  Musculoskeletal:  Negative for joint swelling and myalgias.  Skin:  Negative for color change and rash.  Neurological:  Negative for dizziness and headaches.  Hematological:  Negative for adenopathy. Does not bruise/bleed easily.  Psychiatric/Behavioral:  Negative for agitation and dysphoric mood.        Objective:     BP 118/68   Pulse 60   Temp 98 F (36.7 C)   Resp 16   Ht 5' 2 (1.575 m)   Wt 123 lb 6.4 oz (56 kg)   LMP 02/12/2013   SpO2 99%   BMI 22.57 kg/m  Wt Readings from Last 3 Encounters:  07/27/23 123 lb 6.4 oz (56 kg)  06/25/23 125 lb 6.4 oz (56.9 kg)  06/12/23 124 lb 3.2 oz (56.3 kg)    Physical Exam Vitals reviewed.  Constitutional:      General: She is not in acute distress.    Appearance: Normal appearance. She is well-developed.  HENT:     Head: Normocephalic and atraumatic.     Right Ear: External ear normal.     Left Ear: External ear normal.     Mouth/Throat:     Pharynx: No oropharyngeal exudate or posterior oropharyngeal erythema.  Eyes:     General: No scleral icterus.       Right eye: No discharge.         Left eye: No discharge.     Conjunctiva/sclera: Conjunctivae normal.  Neck:     Thyroid : No thyromegaly.  Cardiovascular:     Rate and Rhythm: Normal rate and regular rhythm.  Pulmonary:     Effort: No tachypnea, accessory muscle usage or respiratory distress.  Breath sounds: Normal breath sounds. No decreased breath sounds or wheezing.  Chest:  Breasts:    Right: No inverted nipple, mass, nipple discharge or tenderness (no axillary adenopathy).     Left: No inverted nipple, mass, nipple discharge or tenderness (no axilarry adenopathy).  Abdominal:     General: Bowel sounds are normal.     Palpations: Abdomen is soft.     Tenderness: There is no abdominal tenderness.  Genitourinary:    Comments: Normal external genitalia.  Vaginal vault without lesions.  Cervix identified.  Pap smear performed.  Could not appreciate any adnexal masses or tenderness.   Musculoskeletal:        General: No swelling or tenderness.     Cervical back: Neck supple.  Lymphadenopathy:     Cervical: No cervical adenopathy.  Skin:    Findings: No erythema or rash.  Neurological:     Mental Status: She is alert and oriented to person, place, and time.  Psychiatric:        Mood and Affect: Mood normal.        Behavior: Behavior normal.         Outpatient Encounter Medications as of 07/27/2023  Medication Sig   pantoprazole  (PROTONIX ) 40 MG tablet Take 40 mg by mouth 2 (two) times daily.   rosuvastatin  (CRESTOR ) 5 MG tablet Take one tablet q Monday, Wednesday and Friday   acetaminophen  (TYLENOL ) 500 MG tablet Take 1,000 mg by mouth every 6 (six) hours as needed for moderate pain (pain score 4-6) or mild pain (pain score 1-3).   ALPRAZolam  (XANAX ) 0.25 MG tablet TAKE ONE TABLET DAILY IF NEEDED FOR ANXIETY   aspirin  EC 81 MG tablet Take 1 tablet (81 mg total) by mouth daily. Swallow whole.   cholecalciferol (VITAMIN D3) 25 MCG (1000 UNIT) tablet Take 1,000 Units by mouth daily.   Cyanocobalamin   1000 MCG/ML KIT Inject 1,000 mcg as directed every 30 (thirty) days.   EPINEPHrine 0.3 mg/0.3 mL IJ SOAJ injection Inject 0.3 mg into the muscle as needed for anaphylaxis.   metoprolol  succinate (TOPROL  XL) 25 MG 24 hr tablet Take 0.5 tablets (12.5 mg total) by mouth daily. At bedtime   nitroGLYCERIN  (NITROSTAT ) 0.4 MG SL tablet Place 1 tablet (0.4 mg total) under the tongue every 5 (five) minutes as needed for chest pain.   nystatin  ointment (MYCOSTATIN ) Apply 1 Application topically 2 (two) times daily.   ondansetron  (ZOFRAN -ODT) 4 MG disintegrating tablet Take 1 tablet (4 mg total) by mouth every 8 (eight) hours as needed for nausea or vomiting.   UBRELVY 100 MG TABS Take by mouth.   [DISCONTINUED] atorvastatin  (LIPITOR) 10 MG tablet Take 1 tablet (10 mg total) by mouth daily. (Patient not taking: Reported on 06/25/2023)   [DISCONTINUED] neomycin-polymyxin b-dexamethasone  (MAXITROL) 3.5-10000-0.1 SUSP SMARTSIG:In Eye(s)   [DISCONTINUED] omeprazole (PRILOSEC) 40 MG capsule Take 40 mg by mouth daily.   No facility-administered encounter medications on file as of 07/27/2023.     Lab Results  Component Value Date   WBC 6.8 07/13/2023   HGB 13.8 07/13/2023   HCT 41.2 07/13/2023   PLT 277.0 07/13/2023   GLUCOSE 85 07/13/2023   CHOL 209 (H) 07/13/2023   TRIG 134.0 07/13/2023   HDL 70.60 07/13/2023   LDLCALC 111 (H) 07/13/2023   ALT 23 07/13/2023   AST 18 07/13/2023   NA 141 07/13/2023   K 4.1 07/13/2023   CL 106 07/13/2023   CREATININE 0.56 07/13/2023   BUN 13 07/13/2023  CO2 26 07/13/2023   TSH 1.031 11/24/2022   INR 1.0 11/24/2022    DG Chest 2 View Result Date: 06/11/2023 CLINICAL DATA:  Chest pain. EXAM: CHEST - 2 VIEW COMPARISON:  11/24/2022. FINDINGS: Bilateral lung fields are clear. Bilateral costophrenic angles are clear. Normal cardio-mediastinal silhouette. No acute osseous abnormalities. The soft tissues are within normal limits. IMPRESSION: No active cardiopulmonary  disease. Electronically Signed   By: Ree Molt M.D.   On: 06/11/2023 11:07       Assessment & Plan:  Routine general medical examination at a health care facility  Hyperthyroidism Assessment & Plan: S/p ablation. TSH wnl 10/2022. Follow.   Orders: -     Basic metabolic panel with GFR; Future -     TSH; Future  Screening cholesterol level -     Lipid panel; Future -     Hepatic function panel; Future  Health care maintenance Assessment & Plan: Physical today 07/27/23.SABRA  PAP 03/29/20 - negative with negative HPV. Repeat pap today. Colonoscopy 08/25/22 - internal hemorrhoids, diverticulosis. Recommended f/u in 10 years. Mammogram 10/23/22  - Birads 0. F/u left breast mammogram and ultrasound 11/04/22 - Birads I. Recommended to continue annual mammogram.  She will schedule mammogram.    Visit for screening mammogram -     3D Screening Mammogram, Left and Right; Future  Screening for cervical cancer -     Cytology - PAP  Coronary artery disease involving native coronary artery of native heart without angina pectoris Assessment & Plan: Cardiac cath 11/25/22 - Mild to moderate, nonobstructive coronary artery disease with 30-40% mid LAD stenosis.  No significant disease noted in the LMCA, LCx, or RCA.. Mildly reduced left ventricular systolic function with mid left ventricular wall motion abnormality most consistent with a Takotsubo variant. Normal left ventricular filling pressure. Recent echo improved as outlined.  Continues on aspirin . Was on jardiance . Off now due to low blood sugars.  Also off atorvastatin . Taking  toprol  12.5mg  in the evening. ECHO - 05/07/23 - EF 60-65% with no significant valve abnormality. No chest pain or sob reported. Staying active. Feeling better overall. Follow.    Other fatigue Assessment & Plan: Off jardiance  and lipitor. Feels better. Energy better. Follow.    Gastroesophageal reflux disease, unspecified whether esophagitis present Assessment &  Plan: Doing better on protonix . Planning for EGD end of 07/2023.    Sleep apnea, unspecified type Assessment & Plan: S/p HST.  Was trying to use cpap. Has been adjusting/changing mask - trying to find something that works for her.  Planning to change to Bipap. Discussed f/u with pulmonary.    Stress-induced cardiomyopathy Assessment & Plan: Continue aspirin  and metoprolol . Did not tolerate jardiance  or lipitor. ECHO - 05/07/23 - EF 60-65% with no significant valve abnormality. Staying active. Continue f/u with cardiology.    Hypercholesteremia Assessment & Plan: Discussed risk factor modification. Discussed cholesterol treatment. Intolerance to lipitor. Agreeable for low dose crestor . Start 5mg  q Monday, Wednesday and Friday. Follow lipid panel.    Other orders -     Rosuvastatin  Calcium ; Take one tablet q Monday, Wednesday and Friday  Dispense: 39 tablet; Refill: 1     Allena Hamilton, MD

## 2023-07-27 NOTE — Assessment & Plan Note (Addendum)
 Physical today 07/27/23.SABRA  PAP 03/29/20 - negative with negative HPV. Repeat pap today. Colonoscopy 08/25/22 - internal hemorrhoids, diverticulosis. Recommended f/u in 10 years. Mammogram 10/23/22  - Birads 0. F/u left breast mammogram and ultrasound 11/04/22 - Birads I. Recommended to continue annual mammogram.  She will schedule mammogram.

## 2023-07-28 DIAGNOSIS — J3081 Allergic rhinitis due to animal (cat) (dog) hair and dander: Secondary | ICD-10-CM | POA: Diagnosis not present

## 2023-07-28 DIAGNOSIS — J301 Allergic rhinitis due to pollen: Secondary | ICD-10-CM | POA: Diagnosis not present

## 2023-07-28 DIAGNOSIS — J3089 Other allergic rhinitis: Secondary | ICD-10-CM | POA: Diagnosis not present

## 2023-07-29 DIAGNOSIS — J3089 Other allergic rhinitis: Secondary | ICD-10-CM | POA: Diagnosis not present

## 2023-07-29 LAB — CYTOLOGY - PAP
Adequacy: ABSENT
Comment: NEGATIVE
Diagnosis: NEGATIVE
High risk HPV: NEGATIVE

## 2023-07-30 ENCOUNTER — Ambulatory Visit: Payer: Self-pay | Admitting: Internal Medicine

## 2023-08-02 ENCOUNTER — Encounter: Payer: Self-pay | Admitting: Internal Medicine

## 2023-08-02 DIAGNOSIS — E7800 Pure hypercholesterolemia, unspecified: Secondary | ICD-10-CM | POA: Insufficient documentation

## 2023-08-02 DIAGNOSIS — E785 Hyperlipidemia, unspecified: Secondary | ICD-10-CM | POA: Insufficient documentation

## 2023-08-02 NOTE — Assessment & Plan Note (Signed)
 S/p ablation. TSH wnl 10/2022. Follow.

## 2023-08-02 NOTE — Assessment & Plan Note (Signed)
 Continue aspirin  and metoprolol . Did not tolerate jardiance  or lipitor. ECHO - 05/07/23 - EF 60-65% with no significant valve abnormality. Staying active. Continue f/u with cardiology.

## 2023-08-02 NOTE — Assessment & Plan Note (Addendum)
 Cardiac cath 11/25/22 - Mild to moderate, nonobstructive coronary artery disease with 30-40% mid LAD stenosis.  No significant disease noted in the LMCA, LCx, or RCA.. Mildly reduced left ventricular systolic function with mid left ventricular wall motion abnormality most consistent with a Takotsubo variant. Normal left ventricular filling pressure. Recent echo improved as outlined.  Continues on aspirin . Was on jardiance . Off now due to low blood sugars.  Also off atorvastatin . Taking  toprol  12.5mg  in the evening. ECHO - 05/07/23 - EF 60-65% with no significant valve abnormality. No chest pain or sob reported. Staying active. Feeling better overall. Follow.

## 2023-08-02 NOTE — Assessment & Plan Note (Signed)
 Doing better on protonix . Planning for EGD end of 07/2023.

## 2023-08-02 NOTE — Assessment & Plan Note (Signed)
 S/p HST.  Was trying to use cpap. Has been adjusting/changing mask - trying to find something that works for her.  Planning to change to Bipap. Discussed f/u with pulmonary.

## 2023-08-02 NOTE — Assessment & Plan Note (Signed)
 Discussed risk factor modification. Discussed cholesterol treatment. Intolerance to lipitor. Agreeable for low dose crestor . Start 5mg  q Monday, Wednesday and Friday. Follow lipid panel.

## 2023-08-02 NOTE — Assessment & Plan Note (Signed)
 Off jardiance  and lipitor. Feels better. Energy better. Follow.

## 2023-08-04 DIAGNOSIS — R140 Abdominal distension (gaseous): Secondary | ICD-10-CM | POA: Diagnosis not present

## 2023-08-04 DIAGNOSIS — R141 Gas pain: Secondary | ICD-10-CM | POA: Diagnosis not present

## 2023-08-04 DIAGNOSIS — J3081 Allergic rhinitis due to animal (cat) (dog) hair and dander: Secondary | ICD-10-CM | POA: Diagnosis not present

## 2023-08-04 DIAGNOSIS — R142 Eructation: Secondary | ICD-10-CM | POA: Diagnosis not present

## 2023-08-04 DIAGNOSIS — J301 Allergic rhinitis due to pollen: Secondary | ICD-10-CM | POA: Diagnosis not present

## 2023-08-04 DIAGNOSIS — J3089 Other allergic rhinitis: Secondary | ICD-10-CM | POA: Diagnosis not present

## 2023-08-10 DIAGNOSIS — G4733 Obstructive sleep apnea (adult) (pediatric): Secondary | ICD-10-CM | POA: Diagnosis not present

## 2023-08-11 DIAGNOSIS — J3081 Allergic rhinitis due to animal (cat) (dog) hair and dander: Secondary | ICD-10-CM | POA: Diagnosis not present

## 2023-08-11 DIAGNOSIS — J301 Allergic rhinitis due to pollen: Secondary | ICD-10-CM | POA: Diagnosis not present

## 2023-08-11 DIAGNOSIS — J3089 Other allergic rhinitis: Secondary | ICD-10-CM | POA: Diagnosis not present

## 2023-08-13 ENCOUNTER — Ambulatory Visit

## 2023-08-13 DIAGNOSIS — E538 Deficiency of other specified B group vitamins: Secondary | ICD-10-CM

## 2023-08-13 MED ORDER — CYANOCOBALAMIN 1000 MCG/ML IJ SOLN
1000.0000 ug | Freq: Once | INTRAMUSCULAR | Status: AC
  Administered 2023-08-13: 1000 ug via INTRAMUSCULAR

## 2023-08-13 NOTE — Progress Notes (Signed)
 Patient presented for B 12 injection to right deltoid, patient voiced no concerns nor showed any signs of distress during injection.

## 2023-08-18 ENCOUNTER — Encounter: Payer: Self-pay | Admitting: Internal Medicine

## 2023-08-18 DIAGNOSIS — J3089 Other allergic rhinitis: Secondary | ICD-10-CM | POA: Diagnosis not present

## 2023-08-18 DIAGNOSIS — J301 Allergic rhinitis due to pollen: Secondary | ICD-10-CM | POA: Diagnosis not present

## 2023-08-18 DIAGNOSIS — R052 Subacute cough: Secondary | ICD-10-CM | POA: Diagnosis not present

## 2023-08-18 DIAGNOSIS — H1045 Other chronic allergic conjunctivitis: Secondary | ICD-10-CM | POA: Diagnosis not present

## 2023-08-18 DIAGNOSIS — J3081 Allergic rhinitis due to animal (cat) (dog) hair and dander: Secondary | ICD-10-CM | POA: Diagnosis not present

## 2023-08-18 NOTE — Telephone Encounter (Signed)
 Called and spoke to Shannon [name]. Unclear if crestor  is contributing or if her symptoms are coming from her tooth and eye issues. Discussed temporarily stopping and getting her through these procedures. We will have to address treatment for the cholesterol once procedures are complete.

## 2023-08-24 ENCOUNTER — Ambulatory Visit

## 2023-08-24 DIAGNOSIS — K449 Diaphragmatic hernia without obstruction or gangrene: Secondary | ICD-10-CM | POA: Diagnosis not present

## 2023-08-24 DIAGNOSIS — K317 Polyp of stomach and duodenum: Secondary | ICD-10-CM | POA: Diagnosis not present

## 2023-08-24 DIAGNOSIS — K2970 Gastritis, unspecified, without bleeding: Secondary | ICD-10-CM | POA: Diagnosis not present

## 2023-08-25 DIAGNOSIS — J301 Allergic rhinitis due to pollen: Secondary | ICD-10-CM | POA: Diagnosis not present

## 2023-08-25 DIAGNOSIS — J3089 Other allergic rhinitis: Secondary | ICD-10-CM | POA: Diagnosis not present

## 2023-08-25 DIAGNOSIS — J3081 Allergic rhinitis due to animal (cat) (dog) hair and dander: Secondary | ICD-10-CM | POA: Diagnosis not present

## 2023-09-01 DIAGNOSIS — J3081 Allergic rhinitis due to animal (cat) (dog) hair and dander: Secondary | ICD-10-CM | POA: Diagnosis not present

## 2023-09-01 DIAGNOSIS — J301 Allergic rhinitis due to pollen: Secondary | ICD-10-CM | POA: Diagnosis not present

## 2023-09-01 DIAGNOSIS — J3089 Other allergic rhinitis: Secondary | ICD-10-CM | POA: Diagnosis not present

## 2023-09-08 ENCOUNTER — Ambulatory Visit

## 2023-09-08 DIAGNOSIS — J3089 Other allergic rhinitis: Secondary | ICD-10-CM | POA: Diagnosis not present

## 2023-09-08 DIAGNOSIS — J3081 Allergic rhinitis due to animal (cat) (dog) hair and dander: Secondary | ICD-10-CM | POA: Diagnosis not present

## 2023-09-08 DIAGNOSIS — J301 Allergic rhinitis due to pollen: Secondary | ICD-10-CM | POA: Diagnosis not present

## 2023-09-11 DIAGNOSIS — H0012 Chalazion right lower eyelid: Secondary | ICD-10-CM | POA: Diagnosis not present

## 2023-09-14 ENCOUNTER — Ambulatory Visit

## 2023-09-14 ENCOUNTER — Telehealth: Payer: Self-pay | Admitting: Sleep Medicine

## 2023-09-14 NOTE — Telephone Encounter (Signed)
 Patient was scheduled to do in lab sleep study on 09/08/23. We received note from [facility] the patient terminated their study after 40 minutes with no sleep recorded due to severe anxiety

## 2023-09-15 ENCOUNTER — Encounter: Payer: Self-pay | Admitting: Internal Medicine

## 2023-09-15 ENCOUNTER — Ambulatory Visit (INDEPENDENT_AMBULATORY_CARE_PROVIDER_SITE_OTHER)

## 2023-09-15 DIAGNOSIS — E538 Deficiency of other specified B group vitamins: Secondary | ICD-10-CM

## 2023-09-15 DIAGNOSIS — J301 Allergic rhinitis due to pollen: Secondary | ICD-10-CM | POA: Diagnosis not present

## 2023-09-15 DIAGNOSIS — J3089 Other allergic rhinitis: Secondary | ICD-10-CM | POA: Diagnosis not present

## 2023-09-15 DIAGNOSIS — J3081 Allergic rhinitis due to animal (cat) (dog) hair and dander: Secondary | ICD-10-CM | POA: Diagnosis not present

## 2023-09-15 MED ORDER — CYANOCOBALAMIN 1000 MCG/ML IJ SOLN
1000.0000 ug | Freq: Once | INTRAMUSCULAR | Status: AC
  Administered 2023-09-15: 1000 ug via INTRAMUSCULAR

## 2023-09-15 NOTE — Progress Notes (Signed)
 Patient receive B12 in left deltoid patient tolerated well with no complaints

## 2023-09-16 NOTE — Telephone Encounter (Signed)
 Ok to send in diflucan  for her.?

## 2023-09-21 NOTE — Telephone Encounter (Signed)
 Yes HST was done 04/08/2023

## 2023-09-22 ENCOUNTER — Other Ambulatory Visit: Payer: Self-pay | Admitting: Internal Medicine

## 2023-09-22 NOTE — Telephone Encounter (Signed)
 PDMP reviewed. Xanax  rx sent in #20 with no refills.

## 2023-09-24 DIAGNOSIS — J301 Allergic rhinitis due to pollen: Secondary | ICD-10-CM | POA: Diagnosis not present

## 2023-09-24 DIAGNOSIS — J3089 Other allergic rhinitis: Secondary | ICD-10-CM | POA: Diagnosis not present

## 2023-09-24 DIAGNOSIS — J3081 Allergic rhinitis due to animal (cat) (dog) hair and dander: Secondary | ICD-10-CM | POA: Diagnosis not present

## 2023-09-29 DIAGNOSIS — J301 Allergic rhinitis due to pollen: Secondary | ICD-10-CM | POA: Diagnosis not present

## 2023-09-29 DIAGNOSIS — J3089 Other allergic rhinitis: Secondary | ICD-10-CM | POA: Diagnosis not present

## 2023-09-29 DIAGNOSIS — J3081 Allergic rhinitis due to animal (cat) (dog) hair and dander: Secondary | ICD-10-CM | POA: Diagnosis not present

## 2023-10-06 DIAGNOSIS — J301 Allergic rhinitis due to pollen: Secondary | ICD-10-CM | POA: Diagnosis not present

## 2023-10-06 DIAGNOSIS — J3089 Other allergic rhinitis: Secondary | ICD-10-CM | POA: Diagnosis not present

## 2023-10-06 DIAGNOSIS — J3081 Allergic rhinitis due to animal (cat) (dog) hair and dander: Secondary | ICD-10-CM | POA: Diagnosis not present

## 2023-10-07 ENCOUNTER — Ambulatory Visit: Attending: Internal Medicine | Admitting: Internal Medicine

## 2023-10-07 ENCOUNTER — Encounter: Payer: Self-pay | Admitting: Internal Medicine

## 2023-10-07 VITALS — BP 122/72 | HR 71 | Ht 62.0 in | Wt 121.5 lb

## 2023-10-07 DIAGNOSIS — I2510 Atherosclerotic heart disease of native coronary artery without angina pectoris: Secondary | ICD-10-CM

## 2023-10-07 DIAGNOSIS — I5181 Takotsubo syndrome: Secondary | ICD-10-CM | POA: Diagnosis not present

## 2023-10-07 DIAGNOSIS — E785 Hyperlipidemia, unspecified: Secondary | ICD-10-CM | POA: Diagnosis not present

## 2023-10-07 NOTE — Progress Notes (Signed)
 Cardiology Office Note:  .   Date:  10/07/2023  ID:  Shannon [name], DOB 04-16-1965, MRN [id number] PCP: Glendia Shad, MD  [facility] Providers Cardiologist:  Lonni Hanson, MD     History of Present Illness: .   Shannon [name] is a 57 y.o. female with history of mild, nonobstructive coronary artery disease, stress-induced cardiomyopathy with normalization of LVEF, hyperlipidemia, hypothyroidism, and GERD, who presents for follow-up of cardiomyopathy and CAD.  She was last seen in the office in May by Barnie Sierra, NP, at which time she recounted a single episode of chest pain that led to ED visit a day earlier.  ED workup was unrevealing.  Her primary complaint at her visit the next day in our office was of fatigue.  She had previously held atorvastatin  in case that was contributing, but was unsure if the statin holiday made any difference because she was dealing with diverticulitis during that time.  Another statin holiday was recommended and ultimately led to switch to low-dose rosuvastatin .  She was continued on metoprolol  succinate.  Empagliflozin  was discontinued due to associated hyperglycemia.  Today, Shannon [name] reports that she has felt well without any further episodes of chest pain since her ED visit in May.  She now wonders if it may have been gas related, as she is feeling better with less gas after being switched to pantoprazole  by her gastroenterologist.  She continues to have quite a bit of anxiety.  She denies shortness of breath, palpitations, lightheadedness, and edema.  She is now on rosuvastatin  5 mg 3 days a week and is tolerating this well.  Overall, she feels quite a bit better since stopping empagliflozin  and atorvastatin .  She is scheduled for lipid panel with Dr. Glendia later this month.  ROS: See HPI  Studies Reviewed: SABRA        TTE (05/07/2023): Normal LV size and wall thickness.  LVEF 60-65% with normal diastolic parameters.  GLS -20.2%.  Normal RV size  and function.  Normal biatrial size.  No pericardial effusion.  No significant valvular abnormality.  Normal CVP.  Risk Assessment/Calculations:             Physical Exam:   VS:  BP 122/72 (BP Location: Left Arm, Patient Position: Sitting, Cuff Size: Normal)   Pulse 71   Ht 5' 2 (1.575 m)   Wt 121 lb 8 oz (55.1 kg)   LMP 02/12/2013   SpO2 97%   BMI 22.22 kg/m    Wt Readings from Last 3 Encounters:  10/07/23 121 lb 8 oz (55.1 kg)  07/27/23 123 lb 6.4 oz (56 kg)  06/25/23 125 lb 6.4 oz (56.9 kg)    General:  NAD. Neck: No JVD or HJR. Lungs: Clear to auscultation bilaterally without wheezes or crackles. Heart: Regular rate and rhythm without murmurs, rubs, or gallops. Abdomen: Soft, nontender, nondistended. Extremities: No lower extremity edema.  ASSESSMENT AND PLAN: .    Stress-induced cardiomyopathy: Shannon [name] is doing well with normal LVEF in 04/2023.  We discussed the natural history of stress-induced cardiomyopathy.  I have encouraged her to speak with Dr. Glendia about potential role for an SSRI to help combat her increased anxiety since her hospitalization with Takotsubo cardiomyopathy last year.  Continue metoprolol  succinate 12.5 mg daily.  Nonobstructive coronary artery disease and hyperlipidemia: Catheterization in 10/2022 showed mild-moderate mid LAD disease (30-40%).  No other CAD was identified.  We will plan to continue aspirin  and statin therapy to target an  LDL less than 100 (ideally below 70).  Given intolerance of atorvastatin , we will continue with rosuvastatin  5 mg 3 times a week pending repeat lipid panel by Dr. Glendia.  If LDL is above goal, escalation to daily dosing may need to be considered.    Dispo: Return to clinic in 6 months.  Signed, Lonni Hanson, MD

## 2023-10-07 NOTE — Patient Instructions (Signed)

## 2023-10-10 ENCOUNTER — Encounter: Payer: Self-pay | Admitting: Internal Medicine

## 2023-10-12 NOTE — Telephone Encounter (Signed)
 Ok to try magnesium  glycinate before bed. (This is over the counter). Also, I am ok if she gets the flu vaccine and long as she has no problems taking. Can she get when she comes in for B12?

## 2023-10-12 NOTE — Telephone Encounter (Signed)
 Noted. Was she told to take the matnesium glycinate (which is over the counter)?

## 2023-10-12 NOTE — Telephone Encounter (Signed)
 I added Flu shot to the note so she can get both of the shots in opposite arms

## 2023-10-13 DIAGNOSIS — J3081 Allergic rhinitis due to animal (cat) (dog) hair and dander: Secondary | ICD-10-CM | POA: Diagnosis not present

## 2023-10-13 DIAGNOSIS — J3089 Other allergic rhinitis: Secondary | ICD-10-CM | POA: Diagnosis not present

## 2023-10-13 DIAGNOSIS — J301 Allergic rhinitis due to pollen: Secondary | ICD-10-CM | POA: Diagnosis not present

## 2023-10-19 ENCOUNTER — Ambulatory Visit (INDEPENDENT_AMBULATORY_CARE_PROVIDER_SITE_OTHER)

## 2023-10-19 DIAGNOSIS — E538 Deficiency of other specified B group vitamins: Secondary | ICD-10-CM

## 2023-10-19 DIAGNOSIS — Z23 Encounter for immunization: Secondary | ICD-10-CM

## 2023-10-19 MED ORDER — CYANOCOBALAMIN 1000 MCG/ML IJ SOLN
1000.0000 ug | Freq: Once | INTRAMUSCULAR | Status: AC
  Administered 2023-10-19: 1000 ug via INTRAMUSCULAR

## 2023-10-19 NOTE — Progress Notes (Signed)
 Pt received B12 injection in right  deltoid muscle. Pt tolerated it well with no complaints or concerns.    Pt received FLU shot injection in Left  deltoid muscle. Pt tolerated it well with no complaints or concerns.

## 2023-10-21 DIAGNOSIS — H16223 Keratoconjunctivitis sicca, not specified as Sjogren's, bilateral: Secondary | ICD-10-CM | POA: Diagnosis not present

## 2023-10-22 DIAGNOSIS — J3081 Allergic rhinitis due to animal (cat) (dog) hair and dander: Secondary | ICD-10-CM | POA: Diagnosis not present

## 2023-10-22 DIAGNOSIS — J301 Allergic rhinitis due to pollen: Secondary | ICD-10-CM | POA: Diagnosis not present

## 2023-10-22 DIAGNOSIS — J3089 Other allergic rhinitis: Secondary | ICD-10-CM | POA: Diagnosis not present

## 2023-10-26 ENCOUNTER — Ambulatory Visit
Admission: RE | Admit: 2023-10-26 | Discharge: 2023-10-26 | Disposition: A | Discharge status: Home / Self Care | Source: Ambulatory Visit | Attending: Internal Medicine | Admitting: Internal Medicine

## 2023-10-26 DIAGNOSIS — Z1231 Encounter for screening mammogram for malignant neoplasm of breast: Secondary | ICD-10-CM | POA: Diagnosis not present

## 2023-10-27 ENCOUNTER — Other Ambulatory Visit (INDEPENDENT_AMBULATORY_CARE_PROVIDER_SITE_OTHER)

## 2023-10-27 DIAGNOSIS — J3081 Allergic rhinitis due to animal (cat) (dog) hair and dander: Secondary | ICD-10-CM | POA: Diagnosis not present

## 2023-10-27 DIAGNOSIS — E0590 Thyrotoxicosis, unspecified without thyrotoxic crisis or storm: Secondary | ICD-10-CM | POA: Diagnosis not present

## 2023-10-27 DIAGNOSIS — Z13220 Encounter for screening for lipoid disorders: Secondary | ICD-10-CM

## 2023-10-27 DIAGNOSIS — J301 Allergic rhinitis due to pollen: Secondary | ICD-10-CM | POA: Diagnosis not present

## 2023-10-27 DIAGNOSIS — J3089 Other allergic rhinitis: Secondary | ICD-10-CM | POA: Diagnosis not present

## 2023-10-27 LAB — BASIC METABOLIC PANEL WITH GFR
BUN: 12 mg/dL (ref 6–23)
CO2: 30 meq/L (ref 19–32)
Calcium: 9.6 mg/dL (ref 8.4–10.5)
Chloride: 104 meq/L (ref 96–112)
Creatinine, Ser: 0.61 mg/dL (ref 0.40–1.20)
GFR: 97.38 mL/min (ref >60.00)
Glucose, Bld: 93 mg/dL (ref 70–99)
Potassium: 4 meq/L (ref 3.5–5.1)
Sodium: 141 meq/L (ref 135–145)

## 2023-10-27 LAB — LIPID PANEL
Cholesterol: 178 mg/dL (ref 0–200)
HDL: 70.2 mg/dL (ref >39.00)
LDL Cholesterol: 84 mg/dL (ref 0–99)
NonHDL: 107.7
Total CHOL/HDL Ratio: 3
Triglycerides: 118 mg/dL (ref 0.0–149.0)
VLDL: 23.6 mg/dL (ref 0.0–40.0)

## 2023-10-27 LAB — HEPATIC FUNCTION PANEL
ALT: 14 U/L (ref 0–35)
AST: 15 U/L (ref 0–37)
Albumin: 4.1 g/dL (ref 3.5–5.2)
Alkaline Phosphatase: 54 U/L (ref 39–117)
Bilirubin, Direct: 0.1 mg/dL (ref 0.0–0.3)
Total Bilirubin: 0.4 mg/dL (ref 0.2–1.2)
Total Protein: 6.3 g/dL (ref 6.0–8.3)

## 2023-10-27 LAB — TSH: TSH: 2.07 u[IU]/mL (ref 0.35–5.50)

## 2023-10-29 ENCOUNTER — Ambulatory Visit: Admitting: Internal Medicine

## 2023-10-29 VITALS — BP 112/70 | HR 76 | Resp 16 | Ht 62.0 in | Wt 123.6 lb

## 2023-10-29 DIAGNOSIS — M25522 Pain in left elbow: Secondary | ICD-10-CM | POA: Diagnosis not present

## 2023-10-29 DIAGNOSIS — E538 Deficiency of other specified B group vitamins: Secondary | ICD-10-CM

## 2023-10-29 DIAGNOSIS — K219 Gastro-esophageal reflux disease without esophagitis: Secondary | ICD-10-CM

## 2023-10-29 DIAGNOSIS — E785 Hyperlipidemia, unspecified: Secondary | ICD-10-CM

## 2023-10-29 DIAGNOSIS — G4730 Sleep apnea, unspecified: Secondary | ICD-10-CM

## 2023-10-29 DIAGNOSIS — E0590 Thyrotoxicosis, unspecified without thyrotoxic crisis or storm: Secondary | ICD-10-CM

## 2023-10-29 DIAGNOSIS — F419 Anxiety disorder, unspecified: Secondary | ICD-10-CM

## 2023-10-29 DIAGNOSIS — I2510 Atherosclerotic heart disease of native coronary artery without angina pectoris: Secondary | ICD-10-CM

## 2023-10-29 DIAGNOSIS — E041 Nontoxic single thyroid nodule: Secondary | ICD-10-CM

## 2023-10-29 DIAGNOSIS — I5181 Takotsubo syndrome: Secondary | ICD-10-CM

## 2023-10-29 MED ORDER — ROSUVASTATIN CALCIUM 5 MG PO TABS: ORAL_TABLET | ORAL | 1 refills | Status: AC

## 2023-10-29 NOTE — Progress Notes (Signed)
 Subjective:    Patient ID: Shannon [name], female    DOB: [birth year], 58 y.o.   MRN: [id number]  Patient here for  Chief Complaint  Patient presents with   Medical Management of Chronic Issues    HPI Here for a scheduled follow up - follow up regarding CAD, GERD, sleep apnea, hypercholesterolemia and stress induced cardiomyopathy. Had f/u with Dr End 10/07/23 - recommended to continue aspirin  and statin therapy. She is feeling some better overall. Increased stress recently. Broke her tooth beginning of August. Had a temporary crown and subsequent complications. Tooth has been pulled. Has required several surgeries, due to concern regarding connection (closeness) to her sinus. Is scheduled to see ENT next week. Breathing stable. No chest pain. No increased abdominal pain or bowel issues. She is having some left elbow pain. Persistent. Discussed sleep apnea and importance of treatment.    Past Medical History:  Diagnosis Date   Anemia    Anxiety    Breast pain, right 11/07/2012   Breathing difficulty 06/20/2014   Environmental allergies    GERD (gastroesophageal reflux disease)    Hiatal hernia    small   History of migraine headaches    Hyperthyroidism    s/p ablation   Vaginitis 01/16/2013   Past Surgical History:  Procedure Laterality Date   BREAST BIOPSY Right 12/05/2015   neg   CESAREAN SECTION  11/1996   Dr Anette Molt   CHOLECYSTECTOMY  11/2003   Dr Lorrene   JOINT REPLACEMENT Left 2017   hip   LEFT HEART CATH AND CORONARY ANGIOGRAPHY N/A 11/25/2022   Procedure: LEFT HEART CATH AND CORONARY ANGIOGRAPHY;  Surgeon: Mady Bruckner, MD;  Location: [facility];  Service: Cardiovascular;  Laterality: N/A;   NASAL SINUS SURGERY  11/1991   SEPTOPLASTY  05/1988   Dr Maceo   TOOTH EXTRACTION     TUBAL LIGATION  02/1998   UMBILICAL HERNIA REPAIR  02/1998   Family History  Problem Relation Age of Onset   Heart disease Father    Hypertension Father    Thyroid   disease Father    Hypercholesterolemia Father    Kidney cancer Father    Leukemia Father        hairy cell   Hypercholesterolemia Mother    Thyroid  disease Mother    Breast cancer Mother 11   Breast cancer Other        maternal great grandmother   Heart disease Maternal Grandfather        MI - age 14   Prostate cancer Paternal Grandfather    Pancreatic cancer Paternal Grandfather    Lung cancer Paternal Grandfather    Bladder Cancer Neg Hx    Social History   Socioeconomic History   Marital status: Married    Spouse name: Not on file   Number of children: 1   Years of education: Not on file   Highest education level: 12th grade  Occupational History   Not on file  Tobacco Use   Smoking status: Former    Current packs/day: 0.00    Types: Cigarettes    Quit date: 01/28/2000    Years since quitting: 23.7   Smokeless tobacco: Never  Substance and Sexual Activity   Alcohol use: No    Alcohol/week: 0.0 standard drinks of alcohol   Drug use: No   Sexual activity: Not Currently    Partners: Male  Other Topics Concern   Not on file  Social History Narrative  Not on file   Social Drivers of Health   Financial Resource Strain: Low Risk  (03/17/2023)   Overall Financial Resource Strain (CARDIA)    Difficulty of Paying Living Expenses: Not hard at all  Food Insecurity: No Food Insecurity (03/17/2023)   Hunger Vital Sign    Worried About Running Out of Food in the Last Year: Never true    Ran Out of Food in the Last Year: Never true  Transportation Needs: No Transportation Needs (03/17/2023)   PRAPARE - Administrator, Civil Service (Medical): No    Lack of Transportation (Non-Medical): No  Physical Activity: Sufficiently Active (03/17/2023)   Exercise Vital Sign    Days of Exercise per Week: 5 days    Minutes of Exercise per Session: 40 min  Stress: No Stress Concern Present (03/17/2023)   [employer] of Occupational Health - Occupational Stress  Questionnaire    Feeling of Stress : Only a little  Social Connections: Socially Integrated (03/17/2023)   Social Connection and Isolation Panel    Frequency of Communication with Friends and Family: More than three times a week    Frequency of Social Gatherings with Friends and Family: More than three times a week    Attends Religious Services: More than 4 times per year    Active Member of [employer] or Organizations: Yes    Attends Engineer, structural: More than 4 times per year    Marital Status: Married     Review of Systems  Constitutional:  Negative for appetite change and unexpected weight change.  HENT:  Negative for congestion and sinus pressure.   Respiratory:  Negative for cough, chest tightness and shortness of breath.   Cardiovascular:  Negative for chest pain, palpitations and leg swelling.  Gastrointestinal:  Negative for abdominal pain, diarrhea, nausea and vomiting.  Genitourinary:  Negative for difficulty urinating and dysuria.  Musculoskeletal:  Negative for myalgias.       Left elbow pain as outlined.   Skin:  Negative for color change and rash.  Neurological:  Negative for dizziness and headaches.  Psychiatric/Behavioral:  Negative for agitation and dysphoric mood.        Objective:     BP 112/70   Pulse 76   Resp 16   Ht 5' 2 (1.575 m)   Wt 123 lb 9.6 oz (56.1 kg)   LMP 02/12/2013   SpO2 98%   BMI 22.61 kg/m  Wt Readings from Last 3 Encounters:  10/29/23 123 lb 9.6 oz (56.1 kg)  10/07/23 121 lb 8 oz (55.1 kg)  07/27/23 123 lb 6.4 oz (56 kg)    Physical Exam Vitals reviewed.  Constitutional:      General: She is not in acute distress.    Appearance: Normal appearance.  HENT:     Head: Normocephalic and atraumatic.     Right Ear: External ear normal.     Left Ear: External ear normal.     Mouth/Throat:     Pharynx: No oropharyngeal exudate or posterior oropharyngeal erythema.  Eyes:     General: No scleral icterus.       Right eye:  No discharge.        Left eye: No discharge.     Conjunctiva/sclera: Conjunctivae normal.  Neck:     Thyroid : No thyromegaly.  Cardiovascular:     Rate and Rhythm: Normal rate and regular rhythm.  Pulmonary:     Effort: No respiratory distress.     Breath  sounds: Normal breath sounds. No wheezing.  Abdominal:     General: Bowel sounds are normal.     Palpations: Abdomen is soft.     Tenderness: There is no abdominal tenderness.  Musculoskeletal:        General: No swelling.     Cervical back: Neck supple. No tenderness.     Comments: Increased pain to palpation - elbow with increased pain with rotation of forearm.   Lymphadenopathy:     Cervical: No cervical adenopathy.  Skin:    Findings: No erythema or rash.  Neurological:     Mental Status: She is alert.  Psychiatric:        Mood and Affect: Mood normal.        Behavior: Behavior normal.         Outpatient Encounter Medications as of 10/29/2023  Medication Sig   acetaminophen  (TYLENOL ) 500 MG tablet Take 1,000 mg by mouth every 6 (six) hours as needed for moderate pain (pain score 4-6) or mild pain (pain score 1-3).   albuterol (VENTOLIN HFA) 108 (90 Base) MCG/ACT inhaler Inhale 1 puff into the lungs every 6 (six) hours as needed.   ALPRAZolam  (XANAX ) 0.25 MG tablet TAKE ONE TABLET BY MOUTH ONCE DAILY AS NEEDED FOR ANXIETY   aspirin  EC 81 MG tablet Take 1 tablet (81 mg total) by mouth daily. Swallow whole.   cholecalciferol (VITAMIN D3) 25 MCG (1000 UNIT) tablet Take 1,000 Units by mouth daily.   Cyanocobalamin  1000 MCG/ML KIT Inject 1,000 mcg as directed every 30 (thirty) days.   EPINEPHrine 0.3 mg/0.3 mL IJ SOAJ injection Inject 0.3 mg into the muscle as needed for anaphylaxis.   metoprolol  succinate (TOPROL  XL) 25 MG 24 hr tablet Take 0.5 tablets (12.5 mg total) by mouth daily. At bedtime   nitroGLYCERIN  (NITROSTAT ) 0.4 MG SL tablet Place 1 tablet (0.4 mg total) under the tongue every 5 (five) minutes as needed for  chest pain.   nystatin  ointment (MYCOSTATIN ) Apply 1 Application topically 2 (two) times daily. (Patient not taking: Reported on 10/07/2023)   pantoprazole  (PROTONIX ) 40 MG tablet Take 40 mg by mouth 2 (two) times daily.   rosuvastatin  (CRESTOR ) 5 MG tablet Take one tablet every other day.   UBRELVY 100 MG TABS Take by mouth.   [DISCONTINUED] rosuvastatin  (CRESTOR ) 5 MG tablet Take one tablet q Monday, Wednesday and Friday   No facility-administered encounter medications on file as of 10/29/2023.     Lab Results  Component Value Date   WBC 6.8 07/13/2023   HGB 13.8 07/13/2023   HCT 41.2 07/13/2023   PLT 277.0 07/13/2023   GLUCOSE 93 10/27/2023   CHOL 178 10/27/2023   TRIG 118.0 10/27/2023   HDL 70.20 10/27/2023   LDLCALC 84 10/27/2023   ALT 14 10/27/2023   AST 15 10/27/2023   NA 141 10/27/2023   K 4.0 10/27/2023   CL 104 10/27/2023   CREATININE 0.61 10/27/2023   BUN 12 10/27/2023   CO2 30 10/27/2023   TSH 2.07 10/27/2023   INR 1.0 11/24/2022    MM 3D SCREENING MAMMOGRAM BILATERAL BREAST Result Date: 10/28/2023 CLINICAL DATA:  Screening. EXAM: DIGITAL SCREENING BILATERAL MAMMOGRAM WITH TOMOSYNTHESIS AND CAD TECHNIQUE: Bilateral screening digital craniocaudal and mediolateral oblique mammograms were obtained. Bilateral screening digital breast tomosynthesis was performed. The images were evaluated with computer-aided detection. COMPARISON:  Previous exam(s). ACR Breast Density Category c: The breasts are heterogeneously dense, which may obscure small masses. FINDINGS: There are no findings suspicious for  malignancy. IMPRESSION: No mammographic evidence of malignancy. A result letter of this screening mammogram will be mailed directly to the patient. RECOMMENDATION: Screening mammogram in one year. (Code:SM-B-01Y) BI-RADS CATEGORY  1: Negative. Electronically Signed   By: Norleen Croak M.D.   On: 10/28/2023 16:07       Assessment & Plan:  Left elbow pain Assessment &  Plan: Persistent increased left elbow pain. Exam appears to be c/w tendinitis. Unable to take antiinflammatory medication. Discussed elbow strap. Refer to ortho.   Orders: -     Ambulatory referral to Orthopedic Surgery  Anxiety Assessment & Plan: Overall appears to be handling things relatively well. Increased stress recently with her dental issues. Follow.    B12 deficiency Assessment & Plan: Continue b12 injections.    Gastroesophageal reflux disease without esophagitis Assessment & Plan: No upper symptoms reported. Continue protonix .    Hyperlipidemia LDL goal <70 Assessment & Plan: Tolerating crestor . Taking three days per week. LDL improved - 84. Discussed goal LDL. Agreeable to increase to every other day. If tolerates, discussed increasing to q day. Follow lipid panel.    Hyperthyroidism Assessment & Plan: S/p ablation. TSH wnl 10/27/23. Follow tsh.    Nonobstructive atherosclerosis of coronary artery Assessment & Plan: Cardiac cath 11/25/22 - Mild to moderate, nonobstructive coronary artery disease with 30-40% mid LAD stenosis.  No significant disease noted in the LMCA, LCx, or RCA.. Mildly reduced left ventricular systolic function with mid left ventricular wall motion abnormality most consistent with a Takotsubo variant. Normal left ventricular filling pressure. Recent echo improved as outlined.  Continues on aspirin . Was on jardiance . Off now due to low blood sugars.  Also off atorvastatin . Taking  toprol  12.5mg  in the evening. ECHO - 05/07/23 - EF 60-65% with no significant valve abnormality. No chest pain or sob reported. Staying active. Feeling better overall. Follow.    Sleep apnea, unspecified type Assessment & Plan: Discussed sleep apnea. Not tolerating mask. Has decided to hold on treatment. Discussed importance of treating sleep apnea and risk of untreated sleep apnea. Discussed f/u with Dr Jess. Wants to hold. Will notify me if changes her mind.  Discussed  melatonin.    Stress-induced cardiomyopathy Assessment & Plan: Continue aspirin  and metoprolol . Did not tolerate jardiance  or lipitor. ECHO - 05/07/23 - EF 60-65% with no significant valve abnormality. Staying active. Continue f/u with cardiology.    Thyroid  nodule Assessment & Plan: S/p biopsy. Ok. Continue f/u with endocrinology.    Other orders -     Rosuvastatin  Calcium ; Take one tablet every other day.  Dispense: 45 tablet; Refill: 1     Allena Hamilton, MD

## 2023-11-01 ENCOUNTER — Encounter: Payer: Self-pay | Admitting: Internal Medicine

## 2023-11-01 NOTE — Assessment & Plan Note (Signed)
 Cardiac cath 11/25/22 - Mild to moderate, nonobstructive coronary artery disease with 30-40% mid LAD stenosis.  No significant disease noted in the LMCA, LCx, or RCA.. Mildly reduced left ventricular systolic function with mid left ventricular wall motion abnormality most consistent with a Takotsubo variant. Normal left ventricular filling pressure. Recent echo improved as outlined.  Continues on aspirin . Was on jardiance . Off now due to low blood sugars.  Also off atorvastatin . Taking  toprol  12.5mg  in the evening. ECHO - 05/07/23 - EF 60-65% with no significant valve abnormality. No chest pain or sob reported. Staying active. Feeling better overall. Follow.

## 2023-11-01 NOTE — Assessment & Plan Note (Signed)
 Continue aspirin  and metoprolol . Did not tolerate jardiance  or lipitor. ECHO - 05/07/23 - EF 60-65% with no significant valve abnormality. Staying active. Continue f/u with cardiology.

## 2023-11-01 NOTE — Assessment & Plan Note (Signed)
 Tolerating crestor . Taking three days per week. LDL improved - 84. Discussed goal LDL. Agreeable to increase to every other day. If tolerates, discussed increasing to q day. Follow lipid panel.

## 2023-11-01 NOTE — Assessment & Plan Note (Signed)
 Overall appears to be handling things relatively well. Increased stress recently with her dental issues. Follow.

## 2023-11-01 NOTE — Assessment & Plan Note (Signed)
 Persistent increased left elbow pain. Exam appears to be c/w tendinitis. Unable to take antiinflammatory medication. Discussed elbow strap. Refer to ortho.

## 2023-11-01 NOTE — Assessment & Plan Note (Addendum)
 Discussed sleep apnea. Not tolerating mask. Has decided to hold on treatment. Discussed importance of treating sleep apnea and risk of untreated sleep apnea. Discussed f/u with Dr Jess. Wants to hold. Will notify me if changes her mind.  Discussed melatonin.

## 2023-11-01 NOTE — Assessment & Plan Note (Signed)
Continue b12 injections.  

## 2023-11-01 NOTE — Assessment & Plan Note (Signed)
 S/p biopsy. Ok. Continue f/u with endocrinology.

## 2023-11-01 NOTE — Assessment & Plan Note (Signed)
 No upper symptoms reported.  Continue protonix.

## 2023-11-01 NOTE — Assessment & Plan Note (Addendum)
 S/p ablation. TSH wnl 10/27/23. Follow tsh.

## 2023-11-03 DIAGNOSIS — J301 Allergic rhinitis due to pollen: Secondary | ICD-10-CM | POA: Diagnosis not present

## 2023-11-03 DIAGNOSIS — J3089 Other allergic rhinitis: Secondary | ICD-10-CM | POA: Diagnosis not present

## 2023-11-03 DIAGNOSIS — J3081 Allergic rhinitis due to animal (cat) (dog) hair and dander: Secondary | ICD-10-CM | POA: Diagnosis not present

## 2023-11-04 DIAGNOSIS — J320 Chronic maxillary sinusitis: Secondary | ICD-10-CM | POA: Diagnosis not present

## 2023-11-06 ENCOUNTER — Encounter: Payer: Self-pay | Admitting: Sleep Medicine

## 2023-11-10 DIAGNOSIS — J301 Allergic rhinitis due to pollen: Secondary | ICD-10-CM | POA: Diagnosis not present

## 2023-11-10 DIAGNOSIS — J3089 Other allergic rhinitis: Secondary | ICD-10-CM | POA: Diagnosis not present

## 2023-11-10 DIAGNOSIS — J3081 Allergic rhinitis due to animal (cat) (dog) hair and dander: Secondary | ICD-10-CM | POA: Diagnosis not present

## 2023-11-15 NOTE — Patient Instructions (Signed)
 ______________________________________________________________________    Procedure instructions  Stop blood-thinners  Do not eat or drink fluids (other than water ) for 6 hours before your procedure  No water  for 2 hours before your procedure  Take your blood pressure medicine with a sip of water   Arrive 30 minutes before your appointment  If sedation is planned, bring suitable driver. [place], [place], & public transportation are NOT APPROVED)  Carefully read the Preparing for your procedure detailed instructions  If you have questions call us  at [phone]  Procedure appointments are for procedures only.   NO medication refills or new problem evaluations will be done on procedure days.   Only the scheduled, pre-approved procedure and side will be done.   ______________________________________________________________________     ______________________________________________________________________    Preparing for your procedure  Appointments: If you think you may not be able to keep your appointment, call 24-48 hours in advance to cancel. We need time to make it available to others.  Procedure visits are for procedures only. During your procedure appointment there will be: NO Prescription Refills*. NO medication changes or discussions*. NO discussion of disability issues*. NO unrelated pain problem evaluations*. NO evaluations to order other pain procedures*. *These will be addressed at a separate and distinct evaluation encounter on the provider's evaluation schedule and not during procedure days.  Instructions: Food intake: Avoid eating anything solid for at least 8 hours prior to your procedure. Clear liquid intake: You may take clear liquids such as water  up to 2 hours prior to your procedure. (No carbonated drinks. No soda.) Transportation: Unless otherwise stated by your physician, bring a driver. (Driver cannot be a Market researcher, Pharmacist, community, or any other form of public  transportation.) Morning Medicines: Except for blood thinners, take all of your other morning medications with a sip of water . Make sure to take your heart and blood pressure medicines. If your blood pressure's lower number is above 100, the case will be rescheduled. Blood thinners: Make sure to stop your blood thinners as instructed.  If you take a blood thinner, but were not instructed to stop it, call our office [phone] and ask to talk to a nurse. Not stopping a blood thinner prior to certain procedures could lead to serious complications. Diabetics on insulin : Notify the staff so that you can be scheduled 1st case in the morning. If your diabetes requires high dose insulin , take only  of your normal insulin  dose the morning of the procedure and notify the staff that you have done so. Preventing infections: Shower with an antibacterial soap the morning of your procedure.  Build-up your immune system: Take 1000 mg of Vitamin C with every meal (3 times a day) the day prior to your procedure. Antibiotics: Inform the nursing staff if you are taking any antibiotics or if you have any conditions that may require antibiotics prior to procedures. (Example: recent joint implants)   Pregnancy: If you are pregnant make sure to notify the nursing staff. Not doing so may result in injury to the fetus, including death.  Sickness: If you have a cold, fever, or any active infections, call and cancel or reschedule your procedure. Receiving steroids while having an infection may result in complications. Arrival: You must be in the facility at least 30 minutes prior to your scheduled procedure. Tardiness: Your scheduled time is also the cutoff time. If you do not arrive at least 15 minutes prior to your procedure, you will be rescheduled.  Children: Do not bring any children with  you. Make arrangements to keep them home. Dress appropriately: There is always a possibility that your clothing may get soiled. Avoid  long dresses. Valuables: Do not bring any jewelry or valuables.  Reasons to call and reschedule or cancel your procedure: (Following these recommendations will minimize the risk of a serious complication.) Surgeries: Avoid having procedures within 2 weeks of any surgery. (Avoid for 2 weeks before or after any surgery). Flu Shots: Avoid having procedures within 2 weeks of a flu shots or . (Avoid for 2 weeks before or after immunizations). Barium: Avoid having a procedure within 7-10 days after having had a radiological study involving the use of radiological contrast. (Myelograms, Barium swallow or enema study). Heart attacks: Avoid any elective procedures or surgeries for the initial 6 months after a Myocardial Infarction (Heart Attack). Blood thinners: It is imperative that you stop these medications before procedures. Let us  know if you if you take any blood thinner.  Infection: Avoid procedures during or within two weeks of an infection (including chest colds or gastrointestinal problems). Symptoms associated with infections include: Localized redness, fever, chills, night sweats or profuse sweating, burning sensation when voiding, cough, congestion, stuffiness, runny nose, sore throat, diarrhea, nausea, vomiting, cold or Flu symptoms, recent or current infections. It is specially important if the infection is over the area that we intend to treat. Heart and lung problems: Symptoms that may suggest an active cardiopulmonary problem include: cough, chest pain, breathing difficulties or shortness of breath, dizziness, ankle swelling, uncontrolled high or unusually low blood pressure, and/or palpitations. If you are experiencing any of these symptoms, cancel your procedure and contact your primary care physician for an evaluation.  Remember:  Regular Business hours are:  Monday to Thursday 8:00 AM to 4:00 PM  Provider's Schedule: Eric Como, MD:  Procedure days: Tuesday and Thursday 7:30  AM to 4:00 PM  Wallie Sherry, MD:  Procedure days: Monday and Wednesday 7:30 AM to 4:00 PM Last  Updated: 01/06/2023 ______________________________________________________________________     ______________________________________________________________________    General Risks and Possible Complications  Patient Responsibilities: It is important that you read this as it is part of your informed consent. It is our duty to inform you of the risks and possible complications associated with treatments offered to you. It is your responsibility as a patient to read this and to ask questions about anything that is not clear or that you believe was not covered in this document.  Patient's Rights: You have the right to refuse treatment. You also have the right to change your mind, even after initially having agreed to have the treatment done. However, under this last option, if you wait until the last second to change your mind, you may be charged for the materials used up to that point.  Introduction: Medicine is not an Visual merchandiser. Everything in Medicine, including the lack of treatment(s), carries the potential for danger, harm, or loss (which is by definition: Risk). In Medicine, a complication is a secondary problem, condition, or disease that can aggravate an already existing one. All treatments carry the risk of possible complications. The fact that a side effects or complications occurs, does not imply that the treatment was conducted incorrectly. It must be clearly understood that these can happen even when everything is done following the highest safety standards.  No treatment: You can choose not to proceed with the proposed treatment alternative. The "PRO(s)" would include: avoiding the risk of complications associated with the therapy. The "CON(s)" would include:  not getting any of the treatment benefits. These benefits fall under one of three categories: diagnostic; therapeutic; and/or  palliative. Diagnostic benefits include: getting information which can ultimately lead to improvement of the disease or symptom(s). Therapeutic benefits are those associated with the successful treatment of the disease. Finally, palliative benefits are those related to the decrease of the primary symptoms, without necessarily curing the condition (example: decreasing the pain from a flare-up of a chronic condition, such as incurable terminal cancer).  General Risks and Complications: These are associated to most interventional treatments. They can occur alone, or in combination. They fall under one of the following six (6) categories: no benefit or worsening of symptoms; bleeding; infection; nerve damage; allergic reactions; and/or death. No benefits or worsening of symptoms: In Medicine there are no guarantees, only probabilities. No healthcare provider can ever guarantee that a medical treatment will work, they can only state the probability that it may. Furthermore, there is always the possibility that the condition may worsen, either directly, or indirectly, as a consequence of the treatment. Bleeding: This is more common if the patient is taking a blood thinner, either prescription or over the counter (example: Goody Powders, Fish oil, Aspirin, Garlic, etc.), or if suffering a condition associated with impaired coagulation (example: Hemophilia, cirrhosis of the liver, low platelet counts, etc.). However, even if you do not have one on these, it can still happen. If you have any of these conditions, or take one of these drugs, make sure to notify your treating physician. Infection: This is more common in patients with a compromised immune system, either due to disease (example: diabetes, cancer, human immunodeficiency virus [HIV], etc.), or due to medications or treatments (example: therapies used to treat cancer and rheumatological diseases). However, even if you do not have one on these, it can still  happen. If you have any of these conditions, or take one of these drugs, make sure to notify your treating physician. Nerve Damage: This is more common when the treatment is an invasive one, but it can also happen with the use of medications, such as those used in the treatment of cancer. The damage can occur to small secondary nerves, or to large primary ones, such as those in the spinal cord and brain. This damage may be temporary or permanent and it may lead to impairments that can range from temporary numbness to permanent paralysis and/or brain death. Allergic Reactions: Any time a substance or material comes in contact with our body, there is the possibility of an allergic reaction. These can range from a mild skin rash (contact dermatitis) to a severe systemic reaction (anaphylactic reaction), which can result in death. Death: In general, any medical intervention can result in death, most of the time due to an unforeseen complication. ______________________________________________________________________      ______________________________________________________________________    Steroid injections  Common steroids for injections Triamcinolone: Used by many sports medicine physicians for large joint and bursal injections, often combined with a local anesthetic like lidocaine . A study focusing on coccydynia (tailbone pain) found triamcinolone was more effective than betamethasone , suggesting it may also be preferable for other localized inflammation conditions. Methylprednisolone: A common alternative to triamcinolone that is also a strong anti-inflammatory. It is available in different formulations, with the acetate suspension being the long-acting option for intra-articular injections. Dexamethasone : This is a non-particulate steroid, meaning it has a lower risk of tissue damage compared to particulate steroids like triamcinolone and methylprednisolone. While less common for this specific  use,  it is an option for targeted injections.   Considerations for physicians Particulate vs. non-particulate steroids: Triamcinolone and methylprednisolone are particulate, meaning they can clump together. Dexamethasone  is non-particulate. Particulate steroids are often preferred for their longer-lasting effects but carry a theoretical higher risk for certain injections (though this is less of a concern in the costochondral joints). Combined injectate: Corticosteroids are typically mixed with a local anesthetic like lidocaine  to provide both immediate pain relief (from the anesthetic) and longer-term inflammation reduction (from the steroid). Imaging guidance: To ensure accurate placement of the needle and medication, physicians may use ultrasound or fluoroscopic guidance for the injection, especially in complex or refractory cases.   Patient guidance Before undergoing a steroid injection, discuss the options with your physician. They will determine the best steroid, dosage, and procedure for your specific case based on factors like: Severity of your condition History of response to other treatments Your overall health status Experience and preference of the physician  Last  Updated: 09/22/2023 ______________________________________________________________________

## 2023-11-15 NOTE — Progress Notes (Unsigned)
 PROVIDER NOTE: Interpretation of information contained herein should be left to medically-trained personnel. Specific patient instructions are provided elsewhere under Patient Instructions section of medical record. This document was created in part using AI and STT-dictation technology, any transcriptional errors that may result from this process are unintentional.  Patient: Shannon [name]  Service: E/M   PCP: Glendia Shad, MD  DOB: [birth year]  DOS: 11/16/2023  Provider: Eric DELENA Como, MD  MRN: [id number]  Delivery: Face-to-face  Specialty: Interventional Pain Management  Type: Established Patient  Setting: Ambulatory outpatient facility  Specialty designation: 09  Referring Prov.: Glendia Shad, MD  Location: Outpatient office facility       History of present illness (HPI) [name], a 58 y.o. year old female, is here today because of her Chronic hip pain, right [M25.551, G89.29]. Ms. Goodreau primary complain today is Hip Pain (right)  Pertinent problems: [name] has Migraine; Epigastric pain; Chronic low back pain (1ry area of Pain) (Bilateral) (R>L) w/o sciatica; Lumbar lateral recess stenosis (L4-5) (Left); Lumbar facet hypertrophy; Chronic lumbar radicular pain (intermittent) (L4 Dermatome) (Left); Chronic hip pain (resolved after hip replacement) (Left); Lumbar spondylosis; Osteoarthritis of sacroiliac joint (Bilateral) (R>L); History of hip replacement (Left); Chronic sacroiliac joint pain (Right); Chronic myofascial pain syndrome (Right Gluteous Muscle); Chronic pain syndrome; Other specified dorsopathies, sacral and sacrococcygeal region; Lumbar Facet syndrome (Bilateral) (R>L); Spondylosis without myelopathy or radiculopathy, lumbosacral region; Chronic toe pain (Right); Great toe pain (Right); Osteoarthritis of great toe joint (Right); DDD (degenerative disc disease), lumbosacral; Right calf pain; Cervicalgia; Cervical myofascial pain syndrome (Left); Occipital neuralgia  (Left); DDD (degenerative disc disease), cervical (C5-6, C6-7); Grade 1 Anterolisthesis of C5/C6; Cervical facet arthropathy (Bilateral) (L>R); Cervical facet syndrome (Bilateral); Temporomandibular joint (TMJ) pain; Neck pain; Breast pain; Chronic hip pain (Right); Somatic dysfunction of sacroiliac joint (Right); Sacroiliac joint dysfunction (Right); Osteoarthritis of hip (Right); Impaired range of motion of hip (Right); Greater trochanteric bursitis (Right); Bursitis of hip (Right); Herpes zoster; Chronic low back pain (1ry area of Pain) (Left) w/o sciatica; Primary osteoarthritis of first metatarsophalangeal (MTP) joint; Chronic sacroiliac joint pain (Bilateral); and Carpal tunnel syndrome of left wrist on their pertinent problem list.  Pain Assessment: Severity of Chronic pain is reported as a 3 /10. Location: Hip Right/radiates down right hip to front of right knee. Onset: More than a month ago. Quality: Aching. Timing: Constant. Modifying factor(s): denies. Vitals:  height is 5' 2 (1.575 m) and weight is 120 lb (54.4 kg). Her temperature is 98.2 F (36.8 C). Her blood pressure is 113/71 and her pulse is 65. Her oxygen saturation is 99%.  BMI: Estimated body mass index is 21.95 kg/m as calculated from the following:   Height as of this encounter: 5' 2 (1.575 m).   Weight as of this encounter: 120 lb (54.4 kg).  Last encounter: 05/26/2023. Last procedure: 05/05/2023.  Reason for encounter: evaluation of worsening, or previously known (established) problem.   Discussed the use of AI scribe software for clinical note transcription with the patient, who gave verbal consent to proceed.  History of Present Illness   Shannon [name] is a 58 year old female who presents with right hand pain.  She experiences pain in the right hand, particularly in the lateral and posterior areas, with a recent shift towards the anterior aspect. She has previously received injections for this issue.        Pharmacotherapy Assessment   No chronic opioid analgesics therapy prescribed by our practice.SABRA  Monitoring: [place] PMP: PDMP reviewed during this encounter.       Pharmacotherapy: No side-effects or adverse reactions reported. Compliance: No problems identified. Effectiveness: Clinically acceptable.  [name], RN  11/16/2023  1:19 PM  Sign when Signing Visit Safety precautions to be maintained throughout the outpatient stay will include: orient to surroundings, keep bed in low position, maintain call bell within reach at all times, provide assistance with transfer out of bed and ambulation.     UDS:  No results found for: SUMMARY  No results found for: CBDTHCR No results found for: D8THCCBX No results found for: D9THCCBX  ROS  Constitutional: Denies any fever or chills Gastrointestinal: No reported hemesis, hematochezia, vomiting, or acute GI distress Musculoskeletal: Denies any acute onset joint swelling, redness, loss of ROM, or weakness Neurological: No reported episodes of acute onset apraxia, aphasia, dysarthria, agnosia, amnesia, paralysis, loss of coordination, or loss of consciousness  Medication Review  ALPRAZolam , Cyanocobalamin , EPINEPHrine, Ubrogepant, acetaminophen , albuterol, aspirin  EC, cholecalciferol, metoprolol  succinate, nitroGLYCERIN , nystatin  ointment, pantoprazole , and rosuvastatin   History Review  Allergy: Shannon [name] is allergic to codeine, decongest-aid [pseudoephedrine], flagyl  [metronidazole ], misc. sulfonamide containing compounds, and sulfa antibiotics. Drug: Shannon [name]  reports no history of drug use. Alcohol:  reports no history of alcohol use. Tobacco:  reports that she quit smoking about 23 years ago. Her smoking use included cigarettes. She has never used smokeless tobacco. Social: Shannon [name]  reports that she quit smoking about 23 years ago. Her smoking use included cigarettes. She has never used smokeless tobacco. She reports that  she does not drink alcohol and does not use drugs. Medical:  has a past medical history of Anemia, Anxiety, Breast pain, right (11/07/2012), Breathing difficulty (06/20/2014), Environmental allergies, GERD (gastroesophageal reflux disease), Hiatal hernia, History of migraine headaches, Hyperthyroidism, and Vaginitis (01/16/2013). Surgical: Ms. Rawl  has a past surgical history that includes Septoplasty (05/1988); Nasal sinus surgery (11/1991); Cesarean section (11/1996); Tubal ligation (02/1998); Umbilical hernia repair (02/1998); Cholecystectomy (11/2003); Joint replacement (Left, 2017); Breast biopsy (Right, 12/05/2015); LEFT HEART CATH AND CORONARY ANGIOGRAPHY (N/A, 11/25/2022); and Tooth extraction. Family: family history includes Breast cancer in an other family member; Breast cancer (age of onset: 54) in her mother; Heart disease in her father and maternal grandfather; Hypercholesterolemia in her father and mother; Hypertension in her father; Kidney cancer in her father; Leukemia in her father; Lung cancer in her paternal grandfather; Pancreatic cancer in her paternal grandfather; Prostate cancer in her paternal grandfather; Thyroid  disease in her father and mother.  Laboratory Chemistry Profile   Renal Lab Results  Component Value Date   BUN 12 10/27/2023   CREATININE 0.61 10/27/2023   GFR 97.38 10/27/2023   GFRAA >60 05/02/2014   GFRNONAA >60 06/11/2023    Hepatic Lab Results  Component Value Date   AST 15 10/27/2023   ALT 14 10/27/2023   ALBUMIN 4.1 10/27/2023   ALKPHOS 54 10/27/2023   LIPASE 21 04/21/2023    Electrolytes Lab Results  Component Value Date   NA 141 10/27/2023   K 4.0 10/27/2023   CL 104 10/27/2023   CALCIUM  9.6 10/27/2023   MG 2.1 11/26/2022    Bone Lab Results  Component Value Date   VD25OH 25.5 (L) 07/13/2023   25OHVITD1 28 (L) 10/05/2017   25OHVITD2 <1.0 10/05/2017   25OHVITD3 28 10/05/2017    Inflammation (CRP: Acute Phase) (ESR: Chronic  Phase) Lab Results  Component Value Date   CRP <1.0 06/16/2023   ESRSEDRATE 9 06/16/2023  Note: Above Lab results reviewed.  Recent Imaging Review  MM 3D SCREENING MAMMOGRAM BILATERAL BREAST CLINICAL DATA:  Screening.  EXAM: DIGITAL SCREENING BILATERAL MAMMOGRAM WITH TOMOSYNTHESIS AND CAD  TECHNIQUE: Bilateral screening digital craniocaudal and mediolateral oblique mammograms were obtained. Bilateral screening digital breast tomosynthesis was performed. The images were evaluated with computer-aided detection.  COMPARISON:  Previous exam(s).  ACR Breast Density Category c: The breasts are heterogeneously dense, which may obscure small masses.  FINDINGS: There are no findings suspicious for malignancy.  IMPRESSION: No mammographic evidence of malignancy. A result letter of this screening mammogram will be mailed directly to the patient.  RECOMMENDATION: Screening mammogram in one year. (Code:SM-B-01Y)  BI-RADS CATEGORY  1: Negative.  Electronically Signed   By: Norleen Croak M.D.   On: 10/28/2023 16:07 Note: Reviewed        Physical Exam  Vitals: BP 113/71   Pulse 65   Temp 98.2 F (36.8 C)   Ht 5' 2 (1.575 m)   Wt 120 lb (54.4 kg)   LMP 02/12/2013   SpO2 99%   BMI 21.95 kg/m  BMI: Estimated body mass index is 21.95 kg/m as calculated from the following:   Height as of this encounter: 5' 2 (1.575 m).   Weight as of this encounter: 120 lb (54.4 kg). Ideal: Ideal body weight: 50.1 kg (110 lb 7.2 oz) Adjusted ideal body weight: 51.8 kg (114 lb 4.3 oz) General appearance: Well nourished, well developed, and well hydrated. In no apparent acute distress Mental status: Alert, oriented x 3 (person, place, & time)       Respiratory: No evidence of acute respiratory distress Eyes: PERLA Physical Exam   MUSCULOSKELETAL: Tenderness at the right trochanteric bursa.       Assessment   Diagnosis Status  1. Chronic hip pain (Right)   2. Osteoarthritis  of hip (Right)   3. Bursitis of other bursa of right hip   4. Greater trochanteric bursitis (Right)    Having a Flare-up Worsened Worsened   Updated Problems: No problems updated.  Plan of Care  Problem-specific:  Assessment and Plan    Right hip osteoarthritis with trochanteric bursitis and pain   Chronic right hip pain presents with tenderness in the lateral and posterior regions, consistent with trochanteric bursitis. Pain is more pronounced towards the front of the hip. Administer an injection into the trochanteric bursa and deeper into the joint, targeting the area with more pain. Proceed with the procedure without sedation and schedule it as soon as possible.       Ms. DAHLILA PFAHLER has a current medication list which includes the following long-term medication(s): metoprolol  succinate, nitroglycerin , and rosuvastatin .  Pharmacotherapy (Medications Ordered): No orders of the defined types were placed in this encounter.  Orders:  Orders Placed This Encounter  Procedures   HIP INJECTION    Standing Status:   Future    Expiration Date:   02/15/2024    Scheduling Instructions:     Procedure: Intra-articular Hip joint injection     Side: Right-sided     Approach: Anterior     Sedation: Patient's choice.     Timeframe: As soon as schedule allows     Interventional Therapies  Risk  Complexity Considerations:   WNL   Planned  Pending:      Under consideration:   Diagnostic left lumbar facet MBB #3   Possible right lumbar facet RFA #1  Possible right SI joint RFA #1  Possible left SI joint  RFA #1  Therapeutic left cervical facet RFA #1    Completed:   Palliative right gluteal MNB  Palliative right SI joint Blk x6 (05/05/2023) (100/100/100/100)  Palliative left SI joint Blk x3 (05/05/2023) (100/100/100/100)  Palliative right L4-5 LESI x1 (12/10/2015) (100/100/100/100)  Palliative left L4-5 LESI x1 (01/11/2015) (100/100/95/95)  Therapeutic right trochanteric  bursa inj. x1 (04/09/2021) (100/100/100/100)  Therapeutic right IA hip inj. x1 (04/09/2021) (100/100/100/100)  Palliative left IA hip injection x2 (06/14/2015) (100/100/80/80-90)  Palliative right lumbar facet MBB x3 (07/15/2018) (100/100/90/100)  Diagnostic left lumbar facet MBB x2 (03/25/2022) (100/100/100/100)  Diagnostic right great toe IA inj. x1  Palliative right IA small joint inj. 1st MTP (Dorsal Metatarsophalangeal ) x1 (10/15/2017) (75/75/75/50-75)  Diagnostic left cervical facet MBB x1 (09/13/2019) (100/100/95/>75)    Therapeutic  Palliative (PRN) options:   Palliative right gluteal MNB Palliative SI joint block   Palliative L4-5 LESI   Palliative lumbar facet block   Diagnostic great toe IA injection   Palliative right IA small joint injection 1st MTP (Dorsal Metatarsophalangeal ) #2  Diagnostic cervical facet MBB       Return for (Clinic): (R) Antero-lateral Hip inj. .    Recent Visits No visits were found meeting these conditions. Showing recent visits within past 90 days and meeting all other requirements Today's Visits Date Type Provider Dept  11/16/23 Office Visit Tanya Glisson, MD [facility]  Showing today's visits and meeting all other requirements Future Appointments No visits were found meeting these conditions. Showing future appointments within next 90 days and meeting all other requirements  I discussed the assessment and treatment plan with the patient. The patient was provided an opportunity to ask questions and all were answered. The patient agreed with the plan and demonstrated an understanding of the instructions.  Patient advised to call back or seek an in-person evaluation if the symptoms or condition worsens.  Duration of encounter: 25 minutes.  Total time on encounter, as per AMA guidelines included both the face-to-face and non-face-to-face time personally spent by the physician and/or other qualified health care professional(s) on  the day of the encounter (includes time in activities that require the physician or other qualified health care professional and does not include time in activities normally performed by clinical staff).    Note by: Glisson DELENA Tanya, MD (TTS and AI technology used. I apologize for any typographical errors that were not detected and corrected.) Date: 11/16/2023; Time: 1:57 PM

## 2023-11-16 ENCOUNTER — Encounter: Payer: Self-pay | Admitting: Pain Medicine

## 2023-11-16 ENCOUNTER — Ambulatory Visit: Attending: Pain Medicine | Admitting: Pain Medicine

## 2023-11-16 VITALS — BP 113/71 | HR 65 | Temp 98.2°F | Ht 62.0 in | Wt 120.0 lb

## 2023-11-16 DIAGNOSIS — M1611 Unilateral primary osteoarthritis, right hip: Secondary | ICD-10-CM | POA: Insufficient documentation

## 2023-11-16 DIAGNOSIS — M25551 Pain in right hip: Secondary | ICD-10-CM | POA: Insufficient documentation

## 2023-11-16 DIAGNOSIS — M48061 Spinal stenosis, lumbar region without neurogenic claudication: Secondary | ICD-10-CM

## 2023-11-16 DIAGNOSIS — M7061 Trochanteric bursitis, right hip: Secondary | ICD-10-CM | POA: Diagnosis not present

## 2023-11-16 DIAGNOSIS — M7071 Other bursitis of hip, right hip: Secondary | ICD-10-CM | POA: Diagnosis not present

## 2023-11-16 DIAGNOSIS — G8929 Other chronic pain: Secondary | ICD-10-CM | POA: Diagnosis not present

## 2023-11-16 NOTE — Progress Notes (Signed)
 Safety precautions to be maintained throughout the outpatient stay will include: orient to surroundings, keep bed in low position, maintain call bell within reach at all times, provide assistance with transfer out of bed and ambulation.

## 2023-11-17 DIAGNOSIS — J3081 Allergic rhinitis due to animal (cat) (dog) hair and dander: Secondary | ICD-10-CM | POA: Diagnosis not present

## 2023-11-17 DIAGNOSIS — J3089 Other allergic rhinitis: Secondary | ICD-10-CM | POA: Diagnosis not present

## 2023-11-17 DIAGNOSIS — J301 Allergic rhinitis due to pollen: Secondary | ICD-10-CM | POA: Diagnosis not present

## 2023-11-18 ENCOUNTER — Ambulatory Visit

## 2023-11-20 ENCOUNTER — Encounter: Payer: Self-pay | Admitting: Internal Medicine

## 2023-11-20 ENCOUNTER — Ambulatory Visit: Admitting: Internal Medicine

## 2023-11-20 VITALS — BP 118/68 | HR 56 | Temp 98.1°F | Ht 62.0 in | Wt 123.4 lb

## 2023-11-20 DIAGNOSIS — Z13220 Encounter for screening for lipoid disorders: Secondary | ICD-10-CM | POA: Diagnosis not present

## 2023-11-20 DIAGNOSIS — R5383 Other fatigue: Secondary | ICD-10-CM

## 2023-11-20 DIAGNOSIS — F419 Anxiety disorder, unspecified: Secondary | ICD-10-CM

## 2023-11-20 DIAGNOSIS — E538 Deficiency of other specified B group vitamins: Secondary | ICD-10-CM

## 2023-11-20 DIAGNOSIS — G4730 Sleep apnea, unspecified: Secondary | ICD-10-CM

## 2023-11-20 DIAGNOSIS — E041 Nontoxic single thyroid nodule: Secondary | ICD-10-CM

## 2023-11-20 DIAGNOSIS — E785 Hyperlipidemia, unspecified: Secondary | ICD-10-CM

## 2023-11-20 DIAGNOSIS — G479 Sleep disorder, unspecified: Secondary | ICD-10-CM

## 2023-11-20 DIAGNOSIS — I2510 Atherosclerotic heart disease of native coronary artery without angina pectoris: Secondary | ICD-10-CM

## 2023-11-20 DIAGNOSIS — I5181 Takotsubo syndrome: Secondary | ICD-10-CM

## 2023-11-20 DIAGNOSIS — E559 Vitamin D deficiency, unspecified: Secondary | ICD-10-CM

## 2023-11-20 DIAGNOSIS — E0590 Thyrotoxicosis, unspecified without thyrotoxic crisis or storm: Secondary | ICD-10-CM

## 2023-11-20 MED ORDER — CYANOCOBALAMIN 1000 MCG/ML IJ SOLN
1000.0000 ug | Freq: Once | INTRAMUSCULAR | Status: AC
  Administered 2023-11-20: 1000 ug via INTRAMUSCULAR

## 2023-11-20 NOTE — Progress Notes (Signed)
 Subjective:    Patient ID: Shannon [name], female    DOB: 04-26-1965, 58 y.o.   MRN: [id number]  Patient here for  Chief Complaint  Patient presents with   Fatigue    HPI Here for work in appt - work in to discuss increased fatigue.  Recently evaluated by pulmonary - f/u sleep apnea - cpap. She is trying to use cpap nightly. Trying to get at least 4-5 hours of sleep. Increased stress. Grandchild has been in the hospital. Family health issues with parents, husband and brother. Increased fatigue. No chest pain. No sob. Eating. No vomiting reported.    Past Medical History:  Diagnosis Date   Anemia    Anxiety    Breast pain, right 11/07/2012   Breathing difficulty 06/20/2014   Environmental allergies    GERD (gastroesophageal reflux disease)    Hiatal hernia    small   History of migraine headaches    Hyperthyroidism    s/p ablation   Vaginitis 01/16/2013   Past Surgical History:  Procedure Laterality Date   BREAST BIOPSY Right 12/05/2015   neg   CESAREAN SECTION  11/1996   Dr Anette Molt   CHOLECYSTECTOMY  11/2003   Dr Lorrene   JOINT REPLACEMENT Left 2017   hip   LEFT HEART CATH AND CORONARY ANGIOGRAPHY N/A 11/25/2022   Procedure: LEFT HEART CATH AND CORONARY ANGIOGRAPHY;  Surgeon: Mady Bruckner, MD;  Location: [facility];  Service: Cardiovascular;  Laterality: N/A;   NASAL SINUS SURGERY  11/1991   SEPTOPLASTY  05/1988   Dr Maceo   TOOTH EXTRACTION     TUBAL LIGATION  02/1998   UMBILICAL HERNIA REPAIR  02/1998   Family History  Problem Relation Age of Onset   Heart disease Father    Hypertension Father    Thyroid  disease Father    Hypercholesterolemia Father    Kidney cancer Father    Leukemia Father        hairy cell   Hypercholesterolemia Mother    Thyroid  disease Mother    Breast cancer Mother 25   Breast cancer Other        maternal great grandmother   Heart disease Maternal Grandfather        MI - age 4   Prostate cancer Paternal  Grandfather    Pancreatic cancer Paternal Grandfather    Lung cancer Paternal Grandfather    Bladder Cancer Neg Hx    Social History   Socioeconomic History   Marital status: Married    Spouse name: Not on file   Number of children: 1   Years of education: Not on file   Highest education level: 12th grade  Occupational History   Not on file  Tobacco Use   Smoking status: Former    Current packs/day: 0.00    Types: Cigarettes    Quit date: 01/28/2000    Years since quitting: 23.8   Smokeless tobacco: Never  Substance and Sexual Activity   Alcohol use: No    Alcohol/week: 0.0 standard drinks of alcohol   Drug use: No   Sexual activity: Not Currently    Partners: Male  Other Topics Concern   Not on file  Social History Narrative   Not on file   Social Drivers of Health   Financial Resource Strain: Low Risk  (03/17/2023)   Overall Financial Resource Strain (CARDIA)    Difficulty of Paying Living Expenses: Not hard at all  Food Insecurity: No Food Insecurity (  03/17/2023)   Hunger Vital Sign    Worried About Running Out of Food in the Last Year: Never true    Ran Out of Food in the Last Year: Never true  Transportation Needs: No Transportation Needs (03/17/2023)   PRAPARE - Administrator, Civil Service (Medical): No    Lack of Transportation (Non-Medical): No  Physical Activity: Sufficiently Active (03/17/2023)   Exercise Vital Sign    Days of Exercise per Week: 5 days    Minutes of Exercise per Session: 40 min  Stress: No Stress Concern Present (03/17/2023)   [employer] of Occupational Health - Occupational Stress Questionnaire    Feeling of Stress : Only a little  Social Connections: Socially Integrated (03/17/2023)   Social Connection and Isolation Panel    Frequency of Communication with Friends and Family: More than three times a week    Frequency of Social Gatherings with Friends and Family: More than three times a week    Attends Religious  Services: More than 4 times per year    Active Member of [employer] or Organizations: Yes    Attends Engineer, Structural: More than 4 times per year    Marital Status: Married     Review of Systems  Constitutional:  Positive for fatigue. Negative for unexpected weight change.  HENT:  Negative for congestion and sinus pressure.   Respiratory:  Negative for cough, chest tightness and shortness of breath.   Cardiovascular:  Negative for chest pain, palpitations and leg swelling.  Gastrointestinal:  Negative for abdominal pain, diarrhea, nausea and vomiting.  Genitourinary:  Negative for difficulty urinating and dysuria.  Musculoskeletal:  Negative for joint swelling and myalgias.  Skin:  Negative for color change and rash.  Neurological:  Negative for dizziness and headaches.  Psychiatric/Behavioral:  Negative for dysphoric mood.        Increased stress. Anxiety.        Objective:     BP 118/68   Pulse (!) 56   Temp 98.1 F (36.7 C)   Ht 5' 2 (1.575 m)   Wt 123 lb 6.4 oz (56 kg)   LMP 02/12/2013   SpO2 98%   BMI 22.57 kg/m  Wt Readings from Last 3 Encounters:  11/20/23 123 lb 6.4 oz (56 kg)  11/16/23 120 lb (54.4 kg)  10/29/23 123 lb 9.6 oz (56.1 kg)    Physical Exam Vitals reviewed.  Constitutional:      General: She is not in acute distress.    Appearance: Normal appearance.  HENT:     Head: Normocephalic and atraumatic.     Right Ear: External ear normal.     Left Ear: External ear normal.     Mouth/Throat:     Pharynx: No oropharyngeal exudate or posterior oropharyngeal erythema.  Eyes:     General: No scleral icterus.       Right eye: No discharge.        Left eye: No discharge.     Conjunctiva/sclera: Conjunctivae normal.  Neck:     Thyroid : No thyromegaly.  Cardiovascular:     Rate and Rhythm: Normal rate and regular rhythm.  Pulmonary:     Effort: No respiratory distress.     Breath sounds: Normal breath sounds. No wheezing.  Abdominal:      General: Bowel sounds are normal.     Palpations: Abdomen is soft.     Tenderness: There is no abdominal tenderness.  Musculoskeletal:  General: No swelling or tenderness.     Cervical back: Neck supple. No tenderness.  Lymphadenopathy:     Cervical: No cervical adenopathy.  Skin:    Findings: No erythema or rash.  Neurological:     Mental Status: She is alert.  Psychiatric:        Mood and Affect: Mood normal.        Behavior: Behavior normal.         Outpatient Encounter Medications as of 11/20/2023  Medication Sig   acetaminophen  (TYLENOL ) 500 MG tablet Take 1,000 mg by mouth every 6 (six) hours as needed for moderate pain (pain score 4-6) or mild pain (pain score 1-3).   albuterol (VENTOLIN HFA) 108 (90 Base) MCG/ACT inhaler Inhale 1 puff into the lungs every 6 (six) hours as needed.   ALPRAZolam  (XANAX ) 0.25 MG tablet TAKE ONE TABLET BY MOUTH ONCE DAILY AS NEEDED FOR ANXIETY   aspirin  EC 81 MG tablet Take 1 tablet (81 mg total) by mouth daily. Swallow whole.   cholecalciferol (VITAMIN D3) 25 MCG (1000 UNIT) tablet Take 1,000 Units by mouth daily.   Cyanocobalamin  1000 MCG/ML KIT Inject 1,000 mcg as directed every 30 (thirty) days.   EPINEPHrine 0.3 mg/0.3 mL IJ SOAJ injection Inject 0.3 mg into the muscle as needed for anaphylaxis.   metoprolol  succinate (TOPROL  XL) 25 MG 24 hr tablet Take 0.5 tablets (12.5 mg total) by mouth daily. At bedtime   nitroGLYCERIN  (NITROSTAT ) 0.4 MG SL tablet Place 1 tablet (0.4 mg total) under the tongue every 5 (five) minutes as needed for chest pain.   pantoprazole  (PROTONIX ) 40 MG tablet Take 40 mg by mouth 2 (two) times daily.   rosuvastatin  (CRESTOR ) 5 MG tablet Take one tablet every other day.   UBRELVY 100 MG TABS Take by mouth.   [DISCONTINUED] nystatin  ointment (MYCOSTATIN ) Apply 1 Application topically 2 (two) times daily.   [EXPIRED] cyanocobalamin  (VITAMIN B12) injection 1,000 mcg    No facility-administered encounter  medications on file as of 11/20/2023.     Lab Results  Component Value Date   WBC 6.8 07/13/2023   HGB 13.8 07/13/2023   HCT 41.2 07/13/2023   PLT 277.0 07/13/2023   GLUCOSE 93 10/27/2023   CHOL 178 10/27/2023   TRIG 118.0 10/27/2023   HDL 70.20 10/27/2023   LDLCALC 84 10/27/2023   ALT 14 10/27/2023   AST 15 10/27/2023   NA 141 10/27/2023   K 4.0 10/27/2023   CL 104 10/27/2023   CREATININE 0.61 10/27/2023   BUN 12 10/27/2023   CO2 30 10/27/2023   TSH 2.07 10/27/2023   INR 1.0 11/24/2022    MM 3D SCREENING MAMMOGRAM BILATERAL BREAST Result Date: 10/28/2023 CLINICAL DATA:  Screening. EXAM: DIGITAL SCREENING BILATERAL MAMMOGRAM WITH TOMOSYNTHESIS AND CAD TECHNIQUE: Bilateral screening digital craniocaudal and mediolateral oblique mammograms were obtained. Bilateral screening digital breast tomosynthesis was performed. The images were evaluated with computer-aided detection. COMPARISON:  Previous exam(s). ACR Breast Density Category c: The breasts are heterogeneously dense, which may obscure small masses. FINDINGS: There are no findings suspicious for malignancy. IMPRESSION: No mammographic evidence of malignancy. A result letter of this screening mammogram will be mailed directly to the patient. RECOMMENDATION: Screening mammogram in one year. (Code:SM-B-01Y) BI-RADS CATEGORY  1: Negative. Electronically Signed   By: Norleen Croak M.D.   On: 10/28/2023 16:07       Assessment & Plan:  Other fatigue -     Vitamin B12; Future  B12 deficiency -  Vitamin B12; Future -     Cyanocobalamin   Hyperthyroidism Assessment & Plan: S/p ablation. TSH wnl 10/27/23. Follow tsh.   Orders: -     Basic metabolic panel with GFR; Future  Screening cholesterol level -     Lipid panel; Future -     Hepatic function panel; Future  Thyroid  nodule Assessment & Plan: S/p biopsy. Ok. Continue f/u with endocrinology.     Vitamin D  insufficiency Assessment & Plan: Check vitamin D  level.    Orders: -     VITAMIN D  25 Hydroxy (Vit-D Deficiency, Fractures); Future  Stress-induced cardiomyopathy Assessment & Plan: Continue aspirin .  Did not tolerate jardiance  or lipitor. ECHO - 05/07/23 - EF 60-65% with no significant valve abnormality. Trying to stay active. Increased fatigue. No chest pain. Breathing stable. Concern that metoprolol  may be contributing some to her symptoms. Pulse rate 50s. Will hold metoprolol . Follow. Call with update. Continue cpap. Continue f/u with cardiology.    Sleep difficulties Assessment & Plan: Pulmonary evaluation - HST. Continue cpap. Increased stress/anxiety. Discussed treatment options. Discussed possible trial of buspar. Follow.    Sleep apnea, unspecified type Assessment & Plan: Continue cpap.    Nonobstructive atherosclerosis of coronary artery Assessment & Plan: Cardiac cath 11/25/22 - Mild to moderate, nonobstructive coronary artery disease with 30-40% mid LAD stenosis.  No significant disease noted in the LMCA, LCx, or RCA.. Mildly reduced left ventricular systolic function with mid left ventricular wall motion abnormality most consistent with a Takotsubo variant. Normal left ventricular filling pressure. Recent echo improved as outlined.  Continues on aspirin . Was on jardiance . Off now due to low blood sugars.  Also off atorvastatin . Taking  toprol  12.5mg  in the evening. Concern may be contributing to her fatigue. Will hold metoprolol . Follow symptoms. Call with update ECHO - 05/07/23 - EF 60-65% with no significant valve abnormality. No chest pain or sob reported. Trying to stay active.    Hyperlipidemia LDL goal <70 Assessment & Plan: Cannot tell a difference in her fatigue off crestor . Restart. Follow.    Anxiety Assessment & Plan: Increased stress and anxiety as outlined. Discussed treatment options. Consider trial of buspar. Follow.       Allena Hamilton, MD

## 2023-11-20 NOTE — Patient Instructions (Addendum)
 Hold metoprolol.

## 2023-11-20 NOTE — Progress Notes (Signed)
 Pt presented for their vitamin B12 injection. Pt was identified through two identifiers. Pt tolerated shot well in their right deltoid.

## 2023-11-21 ENCOUNTER — Encounter: Payer: Self-pay | Admitting: Internal Medicine

## 2023-11-21 NOTE — Assessment & Plan Note (Signed)
 S/p ablation. TSH wnl 10/27/23. Follow tsh.

## 2023-11-21 NOTE — Assessment & Plan Note (Signed)
 Cannot tell a difference in her fatigue off crestor . Restart. Follow.

## 2023-11-21 NOTE — Assessment & Plan Note (Signed)
 Increased stress and anxiety as outlined. Discussed treatment options. Consider trial of buspar. Follow.

## 2023-11-21 NOTE — Assessment & Plan Note (Signed)
 S/p biopsy. Ok. Continue f/u with endocrinology.

## 2023-11-21 NOTE — Assessment & Plan Note (Signed)
 Check vitamin D  level

## 2023-11-21 NOTE — Assessment & Plan Note (Signed)
 Continue aspirin .  Did not tolerate jardiance  or lipitor. ECHO - 05/07/23 - EF 60-65% with no significant valve abnormality. Trying to stay active. Increased fatigue. No chest pain. Breathing stable. Concern that metoprolol  may be contributing some to her symptoms. Pulse rate 50s. Will hold metoprolol . Follow. Call with update. Continue cpap. Continue f/u with cardiology.

## 2023-11-21 NOTE — Assessment & Plan Note (Signed)
 Cardiac cath 11/25/22 - Mild to moderate, nonobstructive coronary artery disease with 30-40% mid LAD stenosis.  No significant disease noted in the LMCA, LCx, or RCA.. Mildly reduced left ventricular systolic function with mid left ventricular wall motion abnormality most consistent with a Takotsubo variant. Normal left ventricular filling pressure. Recent echo improved as outlined.  Continues on aspirin . Was on jardiance . Off now due to low blood sugars.  Also off atorvastatin . Taking  toprol  12.5mg  in the evening. Concern may be contributing to her fatigue. Will hold metoprolol . Follow symptoms. Call with update ECHO - 05/07/23 - EF 60-65% with no significant valve abnormality. No chest pain or sob reported. Trying to stay active.

## 2023-11-21 NOTE — Assessment & Plan Note (Signed)
 Pulmonary evaluation - HST. Continue cpap. Increased stress/anxiety. Discussed treatment options. Discussed possible trial of buspar. Follow.

## 2023-11-21 NOTE — Assessment & Plan Note (Signed)
 Continue cpap.

## 2023-11-24 DIAGNOSIS — J301 Allergic rhinitis due to pollen: Secondary | ICD-10-CM | POA: Diagnosis not present

## 2023-11-24 DIAGNOSIS — J3089 Other allergic rhinitis: Secondary | ICD-10-CM | POA: Diagnosis not present

## 2023-11-24 DIAGNOSIS — J3081 Allergic rhinitis due to animal (cat) (dog) hair and dander: Secondary | ICD-10-CM | POA: Diagnosis not present

## 2023-11-26 ENCOUNTER — Encounter: Payer: Self-pay | Admitting: Pain Medicine

## 2023-11-26 ENCOUNTER — Ambulatory Visit (HOSPITAL_BASED_OUTPATIENT_CLINIC_OR_DEPARTMENT_OTHER): Admitting: Pain Medicine

## 2023-11-26 ENCOUNTER — Ambulatory Visit
Admission: RE | Admit: 2023-11-26 | Discharge: 2023-11-26 | Disposition: A | Discharge status: Home / Self Care | Source: Ambulatory Visit | Attending: Pain Medicine | Admitting: Pain Medicine

## 2023-11-26 VITALS — BP 127/61 | HR 75 | Temp 98.1°F | Resp 17 | Ht 62.0 in | Wt 120.0 lb

## 2023-11-26 DIAGNOSIS — G8929 Other chronic pain: Secondary | ICD-10-CM | POA: Diagnosis not present

## 2023-11-26 DIAGNOSIS — M1611 Unilateral primary osteoarthritis, right hip: Secondary | ICD-10-CM

## 2023-11-26 DIAGNOSIS — M7061 Trochanteric bursitis, right hip: Secondary | ICD-10-CM | POA: Diagnosis not present

## 2023-11-26 DIAGNOSIS — M25651 Stiffness of right hip, not elsewhere classified: Secondary | ICD-10-CM | POA: Diagnosis not present

## 2023-11-26 DIAGNOSIS — M25551 Pain in right hip: Secondary | ICD-10-CM

## 2023-11-26 MED ORDER — LIDOCAINE HCL 2 % IJ SOLN
20.0000 mL | Freq: Once | INTRAMUSCULAR | Status: AC
  Administered 2023-11-26: 400 mg
  Filled 2023-11-26: qty 20

## 2023-11-26 MED ORDER — PENTAFLUOROPROP-TETRAFLUOROETH EX AERO: INHALATION_SPRAY | Freq: Once | CUTANEOUS | Status: DC

## 2023-11-26 MED ORDER — ROPIVACAINE HCL 2 MG/ML IJ SOLN
9.0000 mL | Freq: Once | INTRAMUSCULAR | Status: AC
  Administered 2023-11-26: 9 mL via INTRA_ARTICULAR
  Filled 2023-11-26: qty 20

## 2023-11-26 MED ORDER — IOHEXOL 180 MG/ML  SOLN
10.0000 mL | Freq: Once | INTRAMUSCULAR | Status: AC
  Administered 2023-11-26: 10 mL via INTRA_ARTICULAR
  Filled 2023-11-26: qty 20

## 2023-11-26 MED ORDER — METHYLPREDNISOLONE ACETATE 80 MG/ML IJ SUSP
80.0000 mg | Freq: Once | INTRAMUSCULAR | Status: AC
  Administered 2023-11-26: 80 mg via INTRA_ARTICULAR
  Filled 2023-11-26: qty 1

## 2023-11-26 NOTE — Patient Instructions (Signed)
 ______________________________________________________________________    Post-Procedure Discharge Instructions  INSTRUCTIONS Apply ice:  Purpose: This will minimize any swelling and discomfort after procedure.  When: Day of procedure, as soon as you get home. How: Fill a plastic sandwich bag with crushed ice. Cover it with a small towel and apply to injection site. How long: (15 min on, 15 min off) Apply for 15 minutes then remove x 15 minutes.  Repeat sequence on day of procedure, until you go to bed. Apply heat:  Purpose: To treat any soreness and discomfort from the procedure. When: Starting the next day after the procedure. How: Apply heat to procedure site starting the day following the procedure. How long: May continue to repeat daily, until discomfort goes away. Food intake: Start with clear liquids (like water) and advance to regular food, as tolerated.  Physical activities: Keep activities to a minimum for the first 8 hours after the procedure. After that, then as tolerated. Driving: If you have received any sedation, be responsible and do not drive. You are not allowed to drive for 24 hours after having sedation. Blood thinner: (Applies only to those taking blood thinners) You may restart your blood thinner 6 hours after your procedure. Insulin: (Applies only to Diabetic patients taking insulin) As soon as you can eat, you may resume your normal dosing schedule. Infection prevention: Keep procedure site clean and dry. Shower daily and clean area with soap and water.  PAIN DIARY Post-procedure Pain Diary: Extremely important that this be done correctly and accurately. Recorded information will be used to determine the next step in treatment. For the purpose of accuracy, follow these rules: Evaluate only the area treated. Do not report or include pain from an untreated area. For the purpose of this evaluation, ignore all other areas of pain, except for the treated area. After your  procedure, avoid taking a long nap and attempting to complete the pain diary after you wake up. Instead, set your alarm clock to go off every hour, on the hour, for the initial 8 hours after the procedure. Document the duration of the numbing medicine, and the relief you are getting from it. Do not go to sleep and attempt to complete it later. It will not be accurate. If you received sedation, it is likely that you were given a medication that may cause amnesia. Because of this, completing the diary at a later time may cause the information to be inaccurate. This information is needed to plan your care. Follow-up appointment: Keep your post-procedure follow-up evaluation appointment after the procedure (usually 2 weeks for most procedures, 6 weeks for radiofrequencies). DO NOT FORGET to bring you pain diary with you.   EXPECT... (What should I expect to see with my procedure?) From numbing medicine (AKA: Local Anesthetics): Numbness or decrease in pain. You may also experience some weakness, which if present, could last for the duration of the local anesthetic. Onset: Full effect within 15 minutes of injected. Duration: It will depend on the type of local anesthetic used. On the average, 1 to 8 hours.  From steroids (Applies only if steroids were used): Decrease in swelling or inflammation. Once inflammation is improved, relief of the pain will follow. Onset of benefits: Depends on the amount of swelling present. The more swelling, the longer it will take for the benefits to be seen. In some cases, up to 10 days. Duration: Steroids will stay in the system x 2 weeks. Duration of benefits will depend on multiple posibilities including persistent irritating  factors. Side-effects: If present, they may typically last 2 weeks (the duration of the steroids). Frequent: Cramps (if they occur, drink Gatorade and take over-the-counter Magnesium 450-500 mg once to twice a day); water retention with temporary weight  gain; increases in blood sugar; decreased immune system response; increased appetite. Occasional: Facial flushing (red, warm cheeks); mood swings; menstrual changes. Uncommon: Long-term decrease or suppression of natural hormones; bone thinning. (These are more common with higher doses or more frequent use. This is why we prefer that our patients avoid having any injection therapies in other practices.)  Very Rare: Severe mood changes; psychosis; aseptic necrosis. From procedure: Some discomfort is to be expected once the numbing medicine wears off. This should be minimal if ice and heat are applied as instructed.  CALL IF... (When should I call?) You experience numbness and weakness that gets worse with time, as opposed to wearing off. New onset bowel or bladder incontinence. (Applies only to procedures done in the spine)  Emergency Numbers: Durning business hours (Monday - Thursday, 8:00 AM - 4:00 PM) (Friday, 9:00 AM - 12:00 Noon): [phone] After hours: [phone] NOTE: If you are having a problem and are unable connect with, or to talk to a provider, then go to your nearest urgent care or emergency department. If the problem is serious and urgent, please call 911. ______________________________________________________________________     ______________________________________________________________________    Steroid injections  Common steroids for injections Triamcinolone : Used by many sports medicine physicians for large joint and bursal injections, often combined with a local anesthetic like lidocaine . A study focusing on coccydynia (tailbone pain) found triamcinolone  was more effective than betamethasone, suggesting it may also be preferable for other localized inflammation conditions. Methylprednisolone: A common alternative to triamcinolone  that is also a strong anti-inflammatory. It is available in different formulations, with the acetate suspension being the long-acting  option for intra-articular injections. Dexamethasone : This is a non-particulate steroid, meaning it has a lower risk of tissue damage compared to particulate steroids like triamcinolone  and methylprednisolone. While less common for this specific use, it is an option for targeted injections.   Considerations for physicians Particulate vs. non-particulate steroids: Triamcinolone  and methylprednisolone are particulate, meaning they can clump together. Dexamethasone  is non-particulate. Particulate steroids are often preferred for their longer-lasting effects but carry a theoretical higher risk for certain injections (though this is less of a concern in the costochondral joints). Combined injectate: Corticosteroids are typically mixed with a local anesthetic like lidocaine  to provide both immediate pain relief (from the anesthetic) and longer-term inflammation reduction (from the steroid). Imaging guidance: To ensure accurate placement of the needle and medication, physicians may use ultrasound or fluoroscopic guidance for the injection, especially in complex or refractory cases.   Patient guidance Before undergoing a steroid injection, discuss the options with your physician. They will determine the best steroid, dosage, and procedure for your specific case based on factors like: Severity of your condition History of response to other treatments Your overall health status Experience and preference of the physician  Last  Updated: 09/22/2023 ______________________________________________________________________

## 2023-11-26 NOTE — Progress Notes (Signed)
 PROVIDER NOTE: Interpretation of information contained herein should be left to medically-trained personnel. Specific patient instructions are provided elsewhere under Patient Instructions section of medical record. This document was created in part using STT-dictation technology, any transcriptional errors that may result from this process are unintentional.  Patient: Shannon [name] Type: Established DOB: 03-04-1965 MRN: [id number] PCP: Glendia Shad, MD  Service: Procedure DOS: 11/26/2023 Setting: Ambulatory Location: Ambulatory outpatient facility Delivery: Face-to-face Provider: Eric DELENA Como, MD Specialty: Interventional Pain Management Specialty designation: 09 Location: Outpatient facility Ref. Prov.: Glendia Shad, MD       Interventional Therapy   Type: Hip & bursae injection #2  Laterality: Right (-RT)  Bursae: Trochanteric  Laterality: Right (-RT)  Approach: Percutaneous anterolateral approach. Level: Lower pelvic and hip joint level.  Imaging: Fluoroscopy-guided Non-spinal ([id number]) Anesthesia: Local anesthesia (1-2% Lidocaine ) Anxiolysis: None                 Sedation: No Sedation                       DOS: 11/26/2023  Performed by: Eric DELENA Como, MD  Purpose: Diagnostic/Therapeutic Indications: Hip pain severe enough to impact quality of life or function. Rationale (medical necessity): procedure needed and proper for the diagnosis and/or treatment of Shannon [name]'s medical symptoms and needs. 1. Chronic hip pain (Right)   2. Osteoarthritis of hip (Right)   3. Greater trochanteric bursitis (Right)   4. Impaired range of motion of hip (Right)    NAS-11 Pain score:   Pre-procedure: 3 /10   Post-procedure: 3 /10      Target: Intra-articular aspect of the hip joint & peri-articular bursae Region: Hip joint proper. Femoral region Procedure Type: Percutaneous injection   Position / Prep / Materials:  Position: Supine  Prep solution: ChloraPrep  (2% chlorhexidine gluconate and 70% isopropyl alcohol) Prep Area:  Entire Posterolateral hip area. Materials:  Tray: Block tray Needle(s):  Type: Spinal  Gauge (G): 22  Length: 7-in  Qty: 1  H&P (Pre-op Assessment):  Shannon [name] is a 58 y.o. (year old), female patient, seen today for interventional treatment. She  has a past surgical history that includes Septoplasty (05/1988); Nasal sinus surgery (11/1991); Cesarean section (11/1996); Tubal ligation (02/1998); Umbilical hernia repair (02/1998); Cholecystectomy (11/2003); Joint replacement (Left, 2017); Breast biopsy (Right, 12/05/2015); LEFT HEART CATH AND CORONARY ANGIOGRAPHY (N/A, 11/25/2022); and Tooth extraction. Shannon [name] has a current medication list which includes the following prescription(s): acetaminophen , albuterol, alprazolam , aspirin  ec, cholecalciferol, cyanocobalamin , epinephrine, metoprolol  succinate, nitroglycerin , pantoprazole , rosuvastatin , and ubrelvy, and the following Facility-Administered Medications: pentafluoroprop-tetrafluoroeth. Her primarily concern today is the Hip Pain (right)  Initial Vital Signs:  Pulse/HCG Rate: 75ECG Heart Rate: 65 Temp: 98.1 F (36.7 C) Resp: 16 BP: 121/72 SpO2: 100 %  BMI: Estimated body mass index is 21.95 kg/m as calculated from the following:   Height as of this encounter: 5' 2 (1.575 m).   Weight as of this encounter: 120 lb (54.4 kg).  Risk Assessment: Allergies: Reviewed. She is allergic to codeine, decongest-aid [pseudoephedrine], flagyl  [metronidazole ], misc. sulfonamide containing compounds, and sulfa antibiotics.  Allergy Precautions: None required Coagulopathies: Reviewed. None identified.  Blood-thinner therapy: None at this time Active Infection(s): Reviewed. None identified. Shannon [name] is afebrile  Site Confirmation: Shannon [name] was asked to confirm the procedure and laterality before marking the site Procedure checklist: Completed Consent: Before the procedure and  under the influence of no sedative(s), amnesic(s), or anxiolytics, the patient was informed of  the treatment options, risks and possible complications. To fulfill our ethical and legal obligations, as recommended by the American Medical Association's Code of Ethics, I have informed the patient of my clinical impression; the nature and purpose of the treatment or procedure; the risks, benefits, and possible complications of the intervention; the alternatives, including doing nothing; the risk(s) and benefit(s) of the alternative treatment(s) or procedure(s); and the risk(s) and benefit(s) of doing nothing. The patient was provided information about the general risks and possible complications associated with the procedure. These may include, but are not limited to: failure to achieve desired goals, infection, bleeding, organ or nerve damage, allergic reactions, paralysis, and death. In addition, the patient was informed of those risks and complications associated to the procedure, such as failure to decrease pain; infection; bleeding; organ or nerve damage with subsequent damage to sensory, motor, and/or autonomic systems, resulting in permanent pain, numbness, and/or weakness of one or several areas of the body; allergic reactions; (i.e.: anaphylactic reaction); and/or death. Furthermore, the patient was informed of those risks and complications associated with the medications. These include, but are not limited to: allergic reactions (i.e.: anaphylactic or anaphylactoid reaction(s)); adrenal axis suppression; blood sugar elevation that in diabetics may result in ketoacidosis or comma; water retention that in patients with history of congestive heart failure may result in shortness of breath, pulmonary edema, and decompensation with resultant heart failure; weight gain; swelling or edema; medication-induced neural toxicity; particulate matter embolism and blood vessel occlusion with resultant organ, and/or  nervous system infarction; and/or aseptic necrosis of one or more joints. Finally, the patient was informed that Medicine is not an exact science; therefore, there is also the possibility of unforeseen or unpredictable risks and/or possible complications that may result in a catastrophic outcome. The patient indicated having understood very clearly. We have given the patient no guarantees and we have made no promises. Enough time was given to the patient to ask questions, all of which were answered to the patient's satisfaction. Shannon [name] has indicated that she wanted to continue with the procedure. Attestation: I, the ordering provider, attest that I have discussed with the patient the benefits, risks, side-effects, alternatives, likelihood of achieving goals, and potential problems during recovery for the procedure that I have provided informed consent. Date  Time: 11/26/2023 11:15 AM  Pre-Procedure Preparation:  Monitoring: As per clinic protocol. Respiration, ETCO2, SpO2, BP, heart rate and rhythm monitor placed and checked for adequate function Safety Precautions: Patient was assessed for positional comfort and pressure points before starting the procedure. Time-out: I initiated and conducted the Time-out before starting the procedure, as per protocol. The patient was asked to participate by confirming the accuracy of the Time Out information. Verification of the correct person, site, and procedure were performed and confirmed by me, the nursing staff, and the patient. Time-out conducted as per Joint Commission's Universal Protocol (UP.01.01.01). Time: 1128 Start Time: 1128 hrs.  Narrative                Rationale (medical necessity): procedure needed and proper for the diagnosis and/or treatment of the patient's medical symptoms and needs. Procedural Technique Safety Precautions: Aspiration looking for blood return was conducted prior to all injections. At no point did we inject any  substances, as a needle was being advanced. No attempts were made at seeking any paresthesias. Safe injection practices and needle disposal techniques used. Medications properly checked for expiration dates. SDV (single dose vial) medications used. Description of the Procedure: Protocol guidelines  were followed. The patient was assisted into a comfortable position. The target area was identified and the area prepped in the usual manner. Skin & deeper tissues infiltrated with local anesthetic. Appropriate amount of time allowed to pass for local anesthetics to take effect. The procedure needles were then advanced to the target area. Proper needle placement secured. Negative aspiration confirmed. Solution injected in intermittent fashion, asking for systemic symptoms every 0.5cc of injectate. The needles were then removed and the area cleansed, making sure to leave some of the prepping solution back to take advantage of its long term bactericidal properties.  Technical description of procedure:  Skin & deeper tissues infiltrated with local anesthetic. Appropriate amount of time allowed to pass for local anesthetics to take effect. The procedure needles were then advanced to the target area. Proper needle placement secured. Negative aspiration confirmed. Solution injected in intermittent fashion, asking for systemic symptoms every 0.5cc of injectate. The needles were then removed and the area cleansed, making sure to leave some of the prepping solution back to take advantage of its long term bactericidal properties.                          Vitals:   11/26/23 1114 11/26/23 1228  BP: 121/72 127/61  Pulse: 75   Resp: 16 17  Temp: 98.1 F (36.7 C)   TempSrc: Temporal   SpO2: 100% 100%  Weight: 120 lb (54.4 kg)   Height: 5' 2 (1.575 m)      Start Time: 1128 hrs. End Time: 1239 hrs.  Imaging Guidance (Non-Spinal):          Type of Imaging Technique: Fluoroscopy Guidance  (Non-Spinal) Indication(s): Fluoroscopy guidance for needle placement to enhance accuracy in procedures requiring precise needle localization for targeted delivery of medication in or near specific anatomical locations not easily accessible without such real-time imaging assistance. Exposure Time: Please see nurses notes. Contrast: Before injecting any contrast, we confirmed that the patient did not have an allergy to iodine, shellfish, or radiological contrast. Once satisfactory needle placement was completed at the desired level, radiological contrast was injected. Contrast injected under live fluoroscopy. No contrast complications. See chart for type and volume of contrast used. Fluoroscopic Guidance: I was personally present during the use of fluoroscopy. Tunnel Vision Technique used to obtain the best possible view of the target area. Parallax error corrected before commencing the procedure. Direction-depth-direction technique used to introduce the needle under continuous pulsed fluoroscopy. Once target was reached, antero-posterior, oblique, and lateral fluoroscopic projection used confirm needle placement in all planes. Images permanently stored in EMR. Interpretation: I personally interpreted the imaging intraoperatively. Adequate needle placement confirmed in multiple planes. Appropriate spread of contrast into desired area was observed. No evidence of afferent or efferent intravascular uptake. Permanent images saved into the patient's record.  Post-operative Assessment:  Post-procedure Vital Signs:  Pulse/HCG Rate: 7565 Temp: 98.1 F (36.7 C) Resp: 17 BP: 127/61 SpO2: 100 %  EBL: None  Complications: No immediate post-treatment complications observed by team, or reported by patient.  Note: The patient tolerated the entire procedure well. A repeat set of vitals were taken after the procedure and the patient was kept under observation following institutional policy, for this type of  procedure. Post-procedural neurological assessment was performed, showing return to baseline, prior to discharge. The patient was provided with post-procedure discharge instructions, including a section on how to identify potential problems. Should any problems arise concerning this procedure, the patient  was given instructions to immediately contact us , at any time, without hesitation. In any case, we plan to contact the patient by telephone for a follow-up status report regarding this interventional procedure.  Comments:  No additional relevant information.  Plan of Care (POC)  Orders:  Orders Placed This Encounter  Procedures   HIP INJECTION    Scheduling Instructions:     Side: Right-sided     Sedation: Patient's choice.     Date: 11/26/2023   DG PAIN CLIN[facility] C-ARM 1-60 MIN NO REPORT    Intraoperative interpretation by procedural physician at [facility].    Standing Status:   Standing    Number of Occurrences:   1    Reason for exam::   Assistance in needle guidance and placement for procedures requiring needle placement in or near specific anatomical locations not easily accessible without such assistance.   Informed Consent Details: Physician/Practitioner Attestation; Transcribe to consent form and obtain patient signature    Nursing Order: Transcribe to consent form and obtain patient signature. Note: Always confirm laterality of pain with Shannon [name], before procedure.    Physician/Practitioner attestation of informed consent for procedure/surgical case:   I, the physician/practitioner, attest that I have discussed with the patient the benefits, risks, side effects, alternatives, likelihood of achieving goals and potential problems during recovery for the procedure that I have provided informed consent.    Procedure:   Hip injection    Physician/Practitioner performing the procedure:   Shepard Keltz A. Jullianna Gabor, MD    Indication/Reason:   Hip Joint Pain (Arthralgia)   Provide  equipment / supplies at bedside    Procedure tray: Block Tray (Disposable  single use) Skin infiltration needle: Regular 1.5-in, 25-G, (x1) Block Needle type: Spinal Amount/quantity: 1 Size: Long (7-inch) Gauge: 22G    Standing Status:   Standing    Number of Occurrences:   1    Specify:   Block Tray    No chronic opioid analgesics therapy prescribed by our practice..   Medications ordered for procedure: Meds ordered this encounter  Medications   iohexol  (OMNIPAQUE ) 180 MG/ML injection 10 mL    Must be Myelogram-compatible. If not available, you may substitute with a water-soluble, non-ionic, hypoallergenic, myelogram-compatible radiological contrast medium.   lidocaine  (XYLOCAINE ) 2 % (with pres) injection 400 mg   pentafluoroprop-tetrafluoroeth (GEBAUERS) aerosol   ropivacaine  (PF) 2 mg/mL (0.2%) (NAROPIN ) injection 9 mL   methylPREDNISolone  acetate (DEPO-MEDROL ) injection 80 mg   Medications administered: We administered iohexol , lidocaine , ropivacaine  (PF) 2 mg/mL (0.2%), and methylPREDNISolone  acetate.  See the medical record for exact dosing, route, and time of administration.    Interventional Therapies  Risk  Complexity Considerations:   WNL   Planned  Pending:      Under consideration:   Diagnostic left lumbar facet MBB #3   Possible right lumbar facet RFA #1  Possible right SI joint RFA #1  Possible left SI joint RFA #1  Therapeutic left cervical facet RFA #1    Completed:   Palliative right gluteal MNB  Palliative right SI joint Blk x6 (05/05/2023) (100/100/100/100)  Palliative left SI joint Blk x3 (05/05/2023) (100/100/100/100)  Palliative right L4-5 LESI x1 (12/10/2015) (100/100/100/100)  Palliative left L4-5 LESI x1 (01/11/2015) (100/100/95/95)  Therapeutic right trochanteric bursa inj. x1 (04/09/2021) (100/100/100/100)  Therapeutic right IA hip inj. x1 (04/09/2021) (100/100/100/100)  Palliative left IA hip injection x2 (06/14/2015)  (100/100/80/80-90)  Palliative right lumbar facet MBB x3 (07/15/2018) (100/100/90/100)  Diagnostic left lumbar facet MBB x2 (  03/25/2022) (100/100/100/100)  Diagnostic right great toe IA inj. x1  Palliative right IA small joint inj. 1st MTP (Dorsal Metatarsophalangeal ) x1 (10/15/2017) (75/75/75/50-75)  Diagnostic left cervical facet MBB x1 (09/13/2019) (100/100/95/>75)    Therapeutic  Palliative (PRN) options:   Palliative right gluteal MNB Palliative SI joint block   Palliative L4-5 LESI   Palliative lumbar facet block   Diagnostic great toe IA injection   Palliative right IA small joint injection 1st MTP (Dorsal Metatarsophalangeal ) #2  Diagnostic cervical facet MBB        Follow-up plan:   Return in about 2 weeks (around 12/10/2023) for (Face2F), (PPE).     Recent Visits Date Type Provider Dept  11/16/23 Office Visit Tanya Glisson, MD [facility]  Showing recent visits within past 90 days and meeting all other requirements Today's Visits Date Type Provider Dept  11/26/23 Procedure visit Tanya Glisson, MD [facility]  Showing today's visits and meeting all other requirements Future Appointments Date Type Provider Dept  12/14/23 Appointment Tanya Glisson, MD [facility]  Showing future appointments within next 90 days and meeting all other requirements   Disposition: Discharge home  Discharge (Date  Time): 11/26/2023; 1245 hrs.   Primary Care Physician: Glendia Shad, MD Location: [facility] Note by: Glisson DELENA Tanya, MD (TTS technology used. I apologize for any typographical errors that were not detected and corrected.) Date: 11/26/2023; Time: 12:46 PM  Disclaimer:  Medicine is not an visual merchandiser. The only guarantee in medicine is that nothing is guaranteed. It is important to note that the decision to proceed with this intervention was based on the information collected from the patient.  The Data and conclusions were drawn from the patient's questionnaire, the interview, and the physical examination. Because the information was provided in large part by the patient, it cannot be guaranteed that it has not been purposely or unconsciously manipulated. Every effort has been made to obtain as much relevant data as possible for this evaluation. It is important to note that the conclusions that lead to this procedure are derived in large part from the available data. Always take into account that the treatment will also be dependent on availability of resources and existing treatment guidelines, considered by other Pain Management Practitioners as being common knowledge and practice, at the time of the intervention. For Medico-Legal purposes, it is also important to point out that variation in procedural techniques and pharmacological choices are the acceptable norm. The indications, contraindications, technique, and results of the above procedure should only be interpreted and judged by a Board-Certified Interventional Pain Specialist with extensive familiarity and expertise in the same exact procedure and technique.

## 2023-11-27 ENCOUNTER — Telehealth: Payer: Self-pay

## 2023-11-27 NOTE — Telephone Encounter (Signed)
 Post procedure follow up.  Patient states she is doing well.   ?

## 2023-12-01 DIAGNOSIS — M25522 Pain in left elbow: Secondary | ICD-10-CM | POA: Diagnosis not present

## 2023-12-02 ENCOUNTER — Encounter: Payer: Self-pay | Admitting: Internal Medicine

## 2023-12-02 DIAGNOSIS — M7710 Lateral epicondylitis, unspecified elbow: Secondary | ICD-10-CM | POA: Insufficient documentation

## 2023-12-03 DIAGNOSIS — J3089 Other allergic rhinitis: Secondary | ICD-10-CM | POA: Diagnosis not present

## 2023-12-03 DIAGNOSIS — J301 Allergic rhinitis due to pollen: Secondary | ICD-10-CM | POA: Diagnosis not present

## 2023-12-03 DIAGNOSIS — J3081 Allergic rhinitis due to animal (cat) (dog) hair and dander: Secondary | ICD-10-CM | POA: Diagnosis not present

## 2023-12-08 DIAGNOSIS — J3089 Other allergic rhinitis: Secondary | ICD-10-CM | POA: Diagnosis not present

## 2023-12-08 DIAGNOSIS — J3081 Allergic rhinitis due to animal (cat) (dog) hair and dander: Secondary | ICD-10-CM | POA: Diagnosis not present

## 2023-12-08 DIAGNOSIS — J301 Allergic rhinitis due to pollen: Secondary | ICD-10-CM | POA: Diagnosis not present

## 2023-12-14 ENCOUNTER — Ambulatory Visit: Attending: Pain Medicine | Admitting: Pain Medicine

## 2023-12-14 ENCOUNTER — Telehealth: Payer: Self-pay

## 2023-12-14 ENCOUNTER — Encounter: Payer: Self-pay | Admitting: Pain Medicine

## 2023-12-14 VITALS — BP 125/85 | HR 93 | Temp 98.2°F | Ht 62.0 in | Wt 120.0 lb

## 2023-12-14 DIAGNOSIS — M25651 Stiffness of right hip, not elsewhere classified: Secondary | ICD-10-CM | POA: Diagnosis not present

## 2023-12-14 DIAGNOSIS — M7071 Other bursitis of hip, right hip: Secondary | ICD-10-CM | POA: Diagnosis not present

## 2023-12-14 DIAGNOSIS — M47817 Spondylosis without myelopathy or radiculopathy, lumbosacral region: Secondary | ICD-10-CM | POA: Diagnosis not present

## 2023-12-14 DIAGNOSIS — Z09 Encounter for follow-up examination after completed treatment for conditions other than malignant neoplasm: Secondary | ICD-10-CM | POA: Diagnosis not present

## 2023-12-14 DIAGNOSIS — M7061 Trochanteric bursitis, right hip: Secondary | ICD-10-CM | POA: Insufficient documentation

## 2023-12-14 DIAGNOSIS — M25551 Pain in right hip: Secondary | ICD-10-CM | POA: Diagnosis not present

## 2023-12-14 DIAGNOSIS — G8929 Other chronic pain: Secondary | ICD-10-CM | POA: Insufficient documentation

## 2023-12-14 DIAGNOSIS — M47816 Spondylosis without myelopathy or radiculopathy, lumbar region: Secondary | ICD-10-CM | POA: Diagnosis not present

## 2023-12-14 DIAGNOSIS — M545 Low back pain, unspecified: Secondary | ICD-10-CM | POA: Insufficient documentation

## 2023-12-14 NOTE — Telephone Encounter (Signed)
 Her insurance will not approve facets if the pain score is less than 6. You documented it at a 1.

## 2023-12-14 NOTE — Progress Notes (Addendum)
 PROVIDER NOTE: Interpretation of information contained herein should be left to medically-trained personnel. Specific patient instructions are provided elsewhere under Patient Instructions section of medical record. This document was created in part using AI and STT-dictation technology, any transcriptional errors that may result from this process are unintentional.  Patient: Shannon [name]  Service: E/M   PCP: Glendia Shad, MD  DOB: [birth year]  DOS: 12/14/2023  Provider: Eric DELENA Como, MD  MRN: [id number]  Delivery: Face-to-face  Specialty: Interventional Pain Management  Type: Established Patient  Setting: Ambulatory outpatient facility  Specialty designation: 09  Referring Prov.: Glendia Shad, MD  Location: Outpatient office facility       History of present illness (HPI) [name], a 58 y.o. year old female, is here today because of her Chronic hip pain, right [M25.551, G89.29]. [name] primary complain today is Hip Pain (right)  Pertinent problems: [name] has Migraine; Epigastric pain; Chronic low back pain (1ry area of Pain) (Bilateral) (R>L) w/o sciatica; Lumbar lateral recess stenosis (L4-5) (Left); Lumbar facet hypertrophy; Chronic lumbar radicular pain (intermittent) (L4 Dermatome) (Left); Chronic hip pain (resolved after hip replacement) (Left); Lumbar spondylosis; Osteoarthritis of sacroiliac joint (Bilateral) (R>L); History of hip replacement (Left); Chronic sacroiliac joint pain (Right); Chronic myofascial pain syndrome (Right Gluteous Muscle); Chronic pain syndrome; Other specified dorsopathies, sacral and sacrococcygeal region; Lumbar Facet syndrome (Bilateral) (R>L); Spondylosis without myelopathy or radiculopathy, lumbosacral region; Chronic toe pain (Right); Great toe pain (Right); Osteoarthritis of great toe joint (Right); DDD (degenerative disc disease), lumbosacral; Right calf pain; Cervicalgia; Cervical myofascial pain syndrome (Left); Occipital neuralgia  (Left); DDD (degenerative disc disease), cervical (C5-6, C6-7); Grade 1 Anterolisthesis of C5/C6; Cervical facet arthropathy (Bilateral) (L>R); Cervical facet syndrome (Bilateral); Temporomandibular joint (TMJ) pain; Neck pain; Breast pain; Chronic hip pain (Right); Somatic dysfunction of sacroiliac joint (Right); Sacroiliac joint dysfunction (Right); Osteoarthritis of hip (Right); Impaired range of motion of hip (Right); Greater trochanteric bursitis (Right); Bursitis of hip (Right); Herpes zoster; Chronic low back pain (1ry area of Pain) (Left) w/o sciatica; Primary osteoarthritis of first metatarsophalangeal (MTP) joint; Chronic sacroiliac joint pain (Bilateral); and Carpal tunnel syndrome of left wrist on their pertinent problem list.  Pain Assessment: Severity of Chronic pain is reported as a 1 /10 on the right hip area, but a 6/10 in the left lower back. Location: Hip Right/radiates to right upper thigh. Onset: More than a month ago. Quality: Aching. Timing: Intermittent. Modifying factor(s): denies. Vitals:  height is 5' 2 (1.575 m) and weight is 120 lb (54.4 kg). Her temperature is 98.2 F (36.8 C). Her blood pressure is 125/85 and her pulse is 93. Her oxygen saturation is 100%.  BMI: Estimated body mass index is 21.95 kg/m as calculated from the following:   Height as of this encounter: 5' 2 (1.575 m).   Weight as of this encounter: 120 lb (54.4 kg).  Last encounter: 11/16/2023. Last procedure: 11/26/2023.  Reason for encounter: post-procedure evaluation and assessment.   Discussed the use of AI scribe software for clinical note transcription with the patient, who gave verbal consent to proceed.  History of Present Illness   Shannon [name] is a 58 year old female who presents for follow-up after a right hip joint and trochanteric bursa injection.  She experienced complete relief of pain and improvement in function and range of motion following the injection on 11/26/23. She now has new  pain in the back of her hip, possibly in the gluteal region, described as severe.  She has been lifting and twisting while caring for her two-year-old grandson, which may have contributed to her symptoms.      Post-Procedure Evaluation   Type: Hip & bursae injection #2  Laterality: Right (-RT)  Bursae: Trochanteric  Laterality: Right (-RT)  Approach: Percutaneous anterolateral approach. Level: Lower pelvic and hip joint level.  Imaging: Fluoroscopy-guided Non-spinal ([id number]) Anesthesia: Local anesthesia (1-2% Lidocaine ) Anxiolysis: None                 Sedation: No Sedation                       DOS: 11/26/2023  Performed by: Eric DELENA Como, MD  Purpose: Diagnostic/Therapeutic Indications: Hip pain severe enough to impact quality of life or function. Rationale (medical necessity): procedure needed and proper for the diagnosis and/or treatment of [name]'s medical symptoms and needs. 1. Chronic hip pain (Right)   2. Osteoarthritis of hip (Right)   3. Greater trochanteric bursitis (Right)   4. Impaired range of motion of hip (Right)    NAS-11 Pain score:   Pre-procedure: 3 /10   Post-procedure: 3 /10       Effectiveness:  Initial hour after procedure: 100 %. Subsequent 4-6 hours post-procedure: 80 %. Analgesia past initial 6 hours: 100 %. Ongoing improvement:  Analgesic: The patient indicates having attained 100% relief of the pain for the duration of local anesthetic followed by ongoing 100% relief for hip pain. Function: [name] reports improvement in function ROM: [name] reports improvement in ROM  Pharmacotherapy Assessment   No chronic opioid analgesics therapy prescribed by our practice..  Monitoring: [place] PMP: PDMP reviewed during this encounter.       Pharmacotherapy: No side-effects or adverse reactions reported. Compliance: No problems identified. Effectiveness: Clinically acceptable.  Shannon [name] PARAS, RN  12/14/2023  1:06 PM  Signed Safety  precautions to be maintained throughout the outpatient stay will include: orient to surroundings, keep bed in low position, maintain call bell within reach at all times, provide assistance with transfer out of bed and ambulation.     UDS:  No results found for: SUMMARY  No results found for: CBDTHCR No results found for: D8THCCBX No results found for: D9THCCBX  ROS  Constitutional: Denies any fever or chills Gastrointestinal: No reported hemesis, hematochezia, vomiting, or acute GI distress Musculoskeletal: Denies any acute onset joint swelling, redness, loss of ROM, or weakness Neurological: No reported episodes of acute onset apraxia, aphasia, dysarthria, agnosia, amnesia, paralysis, loss of coordination, or loss of consciousness  Medication Review  ALPRAZolam , Cyanocobalamin , EPINEPHrine, Ubrogepant, acetaminophen , albuterol, aspirin  EC, cholecalciferol, metoprolol  succinate, nitroGLYCERIN , pantoprazole , and rosuvastatin   History Review  Allergy: Ms. Fetty is allergic to codeine, decongest-aid [pseudoephedrine], flagyl  [metronidazole ], misc. sulfonamide containing compounds, and sulfa antibiotics. Drug: Ms. Benko  reports no history of drug use. Alcohol:  reports no history of alcohol use. Tobacco:  reports that she quit smoking about 23 years ago. Her smoking use included cigarettes. She has never used smokeless tobacco. Social: Ms. Alkhatib  reports that she quit smoking about 23 years ago. Her smoking use included cigarettes. She has never used smokeless tobacco. She reports that she does not drink alcohol and does not use drugs. Medical:  has a past medical history of Anemia, Anxiety, Breast pain, right (11/07/2012), Breathing difficulty (06/20/2014), Environmental allergies, GERD (gastroesophageal reflux disease), Hiatal hernia, History of migraine headaches, Hyperthyroidism, and Vaginitis (01/16/2013). Surgical: Ms. Juste  has a past surgical history that includes  Septoplasty  (05/1988); Nasal sinus surgery (11/1991); Cesarean section (11/1996); Tubal ligation (02/1998); Umbilical hernia repair (02/1998); Cholecystectomy (11/2003); Joint replacement (Left, 2017); Breast biopsy (Right, 12/05/2015); LEFT HEART CATH AND CORONARY ANGIOGRAPHY (N/A, 11/25/2022); and Tooth extraction. Family: family history includes Breast cancer in an other family member; Breast cancer (age of onset: 37) in her mother; Heart disease in her father and maternal grandfather; Hypercholesterolemia in her father and mother; Hypertension in her father; Kidney cancer in her father; Leukemia in her father; Lung cancer in her paternal grandfather; Pancreatic cancer in her paternal grandfather; Prostate cancer in her paternal grandfather; Thyroid  disease in her father and mother.  Laboratory Chemistry Profile   Renal Lab Results  Component Value Date   BUN 12 10/27/2023   CREATININE 0.61 10/27/2023   GFR 97.38 10/27/2023   GFRAA >60 05/02/2014   GFRNONAA >60 06/11/2023    Hepatic Lab Results  Component Value Date   AST 15 10/27/2023   ALT 14 10/27/2023   ALBUMIN 4.1 10/27/2023   ALKPHOS 54 10/27/2023   LIPASE 21 04/21/2023    Electrolytes Lab Results  Component Value Date   NA 141 10/27/2023   K 4.0 10/27/2023   CL 104 10/27/2023   CALCIUM  9.6 10/27/2023   MG 2.1 11/26/2022    Bone Lab Results  Component Value Date   VD25OH 25.5 (L) 07/13/2023   25OHVITD1 28 (L) 10/05/2017   25OHVITD2 <1.0 10/05/2017   25OHVITD3 28 10/05/2017    Inflammation (CRP: Acute Phase) (ESR: Chronic Phase) Lab Results  Component Value Date   CRP <1.0 06/16/2023   ESRSEDRATE 9 06/16/2023         Note: Above Lab results reviewed.  Recent Imaging Review  DG PAIN CLIN[facility] C-ARM 1-60 MIN NO REPORT Fluoro was used, but no Radiologist interpretation will be provided.  Please refer to NOTES tab for provider progress note. Note: Reviewed        Physical Exam  Vitals: BP 125/85   Pulse 93   Temp 98.2  F (36.8 C)   Ht 5' 2 (1.575 m)   Wt 120 lb (54.4 kg)   LMP 02/12/2013   SpO2 100%   BMI 21.95 kg/m  BMI: Estimated body mass index is 21.95 kg/m as calculated from the following:   Height as of this encounter: 5' 2 (1.575 m).   Weight as of this encounter: 120 lb (54.4 kg). Ideal: Ideal body weight: 50.1 kg (110 lb 7.2 oz) Adjusted ideal body weight: 51.8 kg (114 lb 4.3 oz) General appearance: Well nourished, well developed, and well hydrated. In no apparent acute distress Mental status: Alert, oriented x 3 (person, place, & time)       Respiratory: No evidence of acute respiratory distress Eyes: PERLA Physical Exam   MUSCULOSKELETAL: Pain in right hip and gluteal area upon hyperextension and rotation.       Assessment   Diagnosis Status  1. Chronic hip pain (Right)   2. Greater trochanteric bursitis (Right)   3. Impaired range of motion of hip (Right)   4. Bursitis of other bursa of right hip   5. Postop check   6. Chronic low back pain (1ry area of Pain) (Left) w/o sciatica   7. Lumbar facet hypertrophy   8. Spondylosis without myelopathy or radiculopathy, lumbosacral region    Controlled Controlled Controlled   Updated Problems: No problems updated.  Plan of Care  Problem-specific:  Assessment and Plan    Right lumbar facet joint pain   Persistent  right lumbar facet joint pain continues after a right-sided hip joint and trochanteric bursa injection on October 30th, 2025. Pain is localized to the back and likely originates from the facet joints, worsening with hyperextension and rotation. No sedation was required for the procedure. Administered a facet joint injection for the right lumbar facet joint pain.       Ms. NANCYJO GIVHAN has a current medication list which includes the following long-term medication(s): metoprolol  succinate, nitroglycerin , and rosuvastatin .  Pharmacotherapy (Medications Ordered): No orders of the defined types were placed in this  encounter.  Orders:  Orders Placed This Encounter  Procedures   LUMBAR FACET(MEDIAL BRANCH NERVE BLOCK) MBNB    Diagnosis: Lumbar Facet Syndrome (M47.816); Lumbosacral Facet Syndrome (M47.817); Lumbar Facet Joint Pain (M54.59) Medical Necessity Statement: 1.Severe chronic axial low back pain causing functional impairment documented by ongoing pain scale assessments. 2.Pain present for longer than 3 months (Chronic) documented to have failed noninvasive conservative therapies. 3.Absence of untreated radiculopathy. 4.There is no radiological evidence of untreated fractures, tumor, infection, or deformity.  Physical Examination Findings: Positive Kemp Maneuver: (Y)  Positive Lumbar Hyperextension-Rotation provocative test: (Y)    Standing Status:   Future    Expiration Date:   03/15/2024    Scheduling Instructions:     Procedure: Lumbar facet Block     Type: Medial Branch Block     Side: Left-sided     Purpose: Therapeutic     Level(s): L3-4, L4-5, and L5-S1 Facets (L2, L3, L4, L5, and S1 Medial Branch)     Sedation: No Sedation.     Timeframe: ASAP    Where will this procedure be performed?:   [facility]   Nursing Instructions:    Please complete this patient's postprocedure evaluation.    Scheduling Instructions:     Please complete this patient's postprocedure evaluation.     Interventional Therapies  Risk  Complexity Considerations:   WNL   Planned  Pending:   Therapeutic left lumbar facet MBB #3    Under consideration:   Diagnostic left lumbar facet MBB #3   Possible right lumbar facet RFA #1  Possible right SI joint RFA #1  Possible left SI joint RFA #1  Therapeutic left cervical facet RFA #1    Completed:   Palliative right gluteal MNB  Palliative right SI joint Blk x6 (05/05/2023) (100/100/100/100)  Palliative left SI joint Blk x3 (05/05/2023) (100/100/100/100)  Palliative right L4-5 LESI x1 (12/10/2015) (100/100/100/100)  Palliative left L4-5 LESI  x1 (01/11/2015) (100/100/95/95)  Therapeutic right trochanteric bursa inj. x2 (11/26/2023) (100/100/100/100)  Therapeutic right IA hip inj. x2 (11/26/2023) (100/100/100/100)  Palliative left IA hip injection x2 (06/14/2015) (100/100/80/80-90)  Palliative right lumbar facet MBB x3 (07/15/2018) (100/100/90/100)  Diagnostic left lumbar facet MBB x2 (03/25/2022) (100/100/100/100)  Diagnostic right great toe IA inj. x1  Palliative right IA small joint inj. 1st MTP (Dorsal Metatarsophalangeal ) x1 (10/15/2017) (75/75/75/50-75)  Diagnostic left cervical facet MBB x1 (09/13/2019) (100/100/95/>75)    Therapeutic  Palliative (PRN) options:   Palliative right gluteal MNB Palliative SI joint block   Palliative L4-5 LESI   Palliative lumbar facet block   Diagnostic great toe IA injection   Palliative right IA small joint injection 1st MTP (Dorsal Metatarsophalangeal ) #2  Diagnostic cervical facet MBB       Return for (Clinic): (L) L-FCT Blk #3.    Recent Visits Date Type Provider Dept  11/26/23 Procedure visit Tanya Glisson, MD [facility]  11/16/23 Office Visit  Tanya Glisson, MD [facility]  Showing recent visits within past 90 days and meeting all other requirements Today's Visits Date Type Provider Dept  12/14/23 Office Visit Tanya Glisson, MD [facility]  Showing today's visits and meeting all other requirements Future Appointments No visits were found meeting these conditions. Showing future appointments within next 90 days and meeting all other requirements  I discussed the assessment and treatment plan with the patient. The patient was provided an opportunity to ask questions and all were answered. The patient agreed with the plan and demonstrated an understanding of the instructions.  Patient advised to call back or seek an in-person evaluation if the symptoms or condition worsens.  Duration of encounter: 30 minutes.  Total time on  encounter, as per AMA guidelines included both the face-to-face and non-face-to-face time personally spent by the physician and/or other qualified health care professional(s) on the day of the encounter (includes time in activities that require the physician or other qualified health care professional and does not include time in activities normally performed by clinical staff). Physician's time may include the following activities when performed: Preparing to see the patient (e.g., pre-charting review of records, searching for previously ordered imaging, lab work, and nerve conduction tests) Review of prior analgesic pharmacotherapies. Reviewing PMP Interpreting ordered tests (e.g., lab work, imaging, nerve conduction tests) Performing post-procedure evaluations, including interpretation of diagnostic procedures Obtaining and/or reviewing separately obtained history Performing a medically appropriate examination and/or evaluation Counseling and educating the patient/family/caregiver Ordering medications, tests, or procedures Referring and communicating with other health care professionals (when not separately reported) Documenting clinical information in the electronic or other health record Independently interpreting results (not separately reported) and communicating results to the patient/ family/caregiver Care coordination (not separately reported)  Note by: Glisson DELENA Tanya, MD (TTS and AI technology used. I apologize for any typographical errors that were not detected and corrected.) Date: 12/14/2023; Time: 1:24 PM

## 2023-12-14 NOTE — Progress Notes (Signed)
 Safety precautions to be maintained throughout the outpatient stay will include: orient to surroundings, keep bed in low position, maintain call bell within reach at all times, provide assistance with transfer out of bed and ambulation.

## 2023-12-14 NOTE — Patient Instructions (Signed)
 ______________________________________________________________________    Procedure instructions  Stop blood-thinners  Do not eat or drink fluids (other than water ) for 6 hours before your procedure  No water  for 2 hours before your procedure  Take your blood pressure medicine with a sip of water   Arrive 30 minutes before your appointment  If sedation is planned, bring suitable driver. [place], [place], & public transportation are NOT APPROVED)  Carefully read the Preparing for your procedure detailed instructions  If you have questions call us  at [phone]  Procedure appointments are for procedures only.   NO medication refills or new problem evaluations will be done on procedure days.   Only the scheduled, pre-approved procedure and side will be done.   ______________________________________________________________________     ______________________________________________________________________    Preparing for your procedure  Appointments: If you think you may not be able to keep your appointment, call 24-48 hours in advance to cancel. We need time to make it available to others.  Procedure visits are for procedures only. During your procedure appointment there will be: NO Prescription Refills*. NO medication changes or discussions*. NO discussion of disability issues*. NO unrelated pain problem evaluations*. NO evaluations to order other pain procedures*. *These will be addressed at a separate and distinct evaluation encounter on the provider's evaluation schedule and not during procedure days.  Instructions: Food intake: Avoid eating anything solid for at least 8 hours prior to your procedure. Clear liquid intake: You may take clear liquids such as water  up to 2 hours prior to your procedure. (No carbonated drinks. No soda.) Transportation: Unless otherwise stated by your physician, bring a driver. (Driver cannot be a Market researcher, Pharmacist, community, or any other form of public  transportation.) Morning Medicines: Except for blood thinners, take all of your other morning medications with a sip of water . Make sure to take your heart and blood pressure medicines. If your blood pressure's lower number is above 100, the case will be rescheduled. Blood thinners: Make sure to stop your blood thinners as instructed.  If you take a blood thinner, but were not instructed to stop it, call our office [phone] and ask to talk to a nurse. Not stopping a blood thinner prior to certain procedures could lead to serious complications. Diabetics on insulin : Notify the staff so that you can be scheduled 1st case in the morning. If your diabetes requires high dose insulin , take only  of your normal insulin  dose the morning of the procedure and notify the staff that you have done so. Preventing infections: Shower with an antibacterial soap the morning of your procedure.  Build-up your immune system: Take 1000 mg of Vitamin C with every meal (3 times a day) the day prior to your procedure. Antibiotics: Inform the nursing staff if you are taking any antibiotics or if you have any conditions that may require antibiotics prior to procedures. (Example: recent joint implants)   Pregnancy: If you are pregnant make sure to notify the nursing staff. Not doing so may result in injury to the fetus, including death.  Sickness: If you have a cold, fever, or any active infections, call and cancel or reschedule your procedure. Receiving steroids while having an infection may result in complications. Arrival: You must be in the facility at least 30 minutes prior to your scheduled procedure. Tardiness: Your scheduled time is also the cutoff time. If you do not arrive at least 15 minutes prior to your procedure, you will be rescheduled.  Children: Do not bring any children with  you. Make arrangements to keep them home. Dress appropriately: There is always a possibility that your clothing may get soiled. Avoid  long dresses. Valuables: Do not bring any jewelry or valuables.  Reasons to call and reschedule or cancel your procedure: (Following these recommendations will minimize the risk of a serious complication.) Surgeries: Avoid having procedures within 2 weeks of any surgery. (Avoid for 2 weeks before or after any surgery). Flu Shots: Avoid having procedures within 2 weeks of a flu shots or . (Avoid for 2 weeks before or after immunizations). Barium: Avoid having a procedure within 7-10 days after having had a radiological study involving the use of radiological contrast. (Myelograms, Barium swallow or enema study). Heart attacks: Avoid any elective procedures or surgeries for the initial 6 months after a Myocardial Infarction (Heart Attack). Blood thinners: It is imperative that you stop these medications before procedures. Let us  know if you if you take any blood thinner.  Infection: Avoid procedures during or within two weeks of an infection (including chest colds or gastrointestinal problems). Symptoms associated with infections include: Localized redness, fever, chills, night sweats or profuse sweating, burning sensation when voiding, cough, congestion, stuffiness, runny nose, sore throat, diarrhea, nausea, vomiting, cold or Flu symptoms, recent or current infections. It is specially important if the infection is over the area that we intend to treat. Heart and lung problems: Symptoms that may suggest an active cardiopulmonary problem include: cough, chest pain, breathing difficulties or shortness of breath, dizziness, ankle swelling, uncontrolled high or unusually low blood pressure, and/or palpitations. If you are experiencing any of these symptoms, cancel your procedure and contact your primary care physician for an evaluation.  Remember:  Regular Business hours are:  Monday to Thursday 8:00 AM to 4:00 PM  Provider's Schedule: Eric Como, MD:  Procedure days: Tuesday and Thursday 7:30  AM to 4:00 PM  Wallie Sherry, MD:  Procedure days: Monday and Wednesday 7:30 AM to 4:00 PM Last  Updated: 01/06/2023 ______________________________________________________________________     ______________________________________________________________________    General Risks and Possible Complications  Patient Responsibilities: It is important that you read this as it is part of your informed consent. It is our duty to inform you of the risks and possible complications associated with treatments offered to you. It is your responsibility as a patient to read this and to ask questions about anything that is not clear or that you believe was not covered in this document.  Patient's Rights: You have the right to refuse treatment. You also have the right to change your mind, even after initially having agreed to have the treatment done. However, under this last option, if you wait until the last second to change your mind, you may be charged for the materials used up to that point.  Introduction: Medicine is not an Visual merchandiser. Everything in Medicine, including the lack of treatment(s), carries the potential for danger, harm, or loss (which is by definition: Risk). In Medicine, a complication is a secondary problem, condition, or disease that can aggravate an already existing one. All treatments carry the risk of possible complications. The fact that a side effects or complications occurs, does not imply that the treatment was conducted incorrectly. It must be clearly understood that these can happen even when everything is done following the highest safety standards.  No treatment: You can choose not to proceed with the proposed treatment alternative. The "PRO(s)" would include: avoiding the risk of complications associated with the therapy. The "CON(s)" would include:  not getting any of the treatment benefits. These benefits fall under one of three categories: diagnostic; therapeutic; and/or  palliative. Diagnostic benefits include: getting information which can ultimately lead to improvement of the disease or symptom(s). Therapeutic benefits are those associated with the successful treatment of the disease. Finally, palliative benefits are those related to the decrease of the primary symptoms, without necessarily curing the condition (example: decreasing the pain from a flare-up of a chronic condition, such as incurable terminal cancer).  General Risks and Complications: These are associated to most interventional treatments. They can occur alone, or in combination. They fall under one of the following six (6) categories: no benefit or worsening of symptoms; bleeding; infection; nerve damage; allergic reactions; and/or death. No benefits or worsening of symptoms: In Medicine there are no guarantees, only probabilities. No healthcare provider can ever guarantee that a medical treatment will work, they can only state the probability that it may. Furthermore, there is always the possibility that the condition may worsen, either directly, or indirectly, as a consequence of the treatment. Bleeding: This is more common if the patient is taking a blood thinner, either prescription or over the counter (example: Goody Powders, Fish oil, Aspirin, Garlic, etc.), or if suffering a condition associated with impaired coagulation (example: Hemophilia, cirrhosis of the liver, low platelet counts, etc.). However, even if you do not have one on these, it can still happen. If you have any of these conditions, or take one of these drugs, make sure to notify your treating physician. Infection: This is more common in patients with a compromised immune system, either due to disease (example: diabetes, cancer, human immunodeficiency virus [HIV], etc.), or due to medications or treatments (example: therapies used to treat cancer and rheumatological diseases). However, even if you do not have one on these, it can still  happen. If you have any of these conditions, or take one of these drugs, make sure to notify your treating physician. Nerve Damage: This is more common when the treatment is an invasive one, but it can also happen with the use of medications, such as those used in the treatment of cancer. The damage can occur to small secondary nerves, or to large primary ones, such as those in the spinal cord and brain. This damage may be temporary or permanent and it may lead to impairments that can range from temporary numbness to permanent paralysis and/or brain death. Allergic Reactions: Any time a substance or material comes in contact with our body, there is the possibility of an allergic reaction. These can range from a mild skin rash (contact dermatitis) to a severe systemic reaction (anaphylactic reaction), which can result in death. Death: In general, any medical intervention can result in death, most of the time due to an unforeseen complication. ______________________________________________________________________      ______________________________________________________________________    Steroid injections  Common steroids for injections Triamcinolone: Used by many sports medicine physicians for large joint and bursal injections, often combined with a local anesthetic like lidocaine . A study focusing on coccydynia (tailbone pain) found triamcinolone was more effective than betamethasone , suggesting it may also be preferable for other localized inflammation conditions. Methylprednisolone: A common alternative to triamcinolone that is also a strong anti-inflammatory. It is available in different formulations, with the acetate suspension being the long-acting option for intra-articular injections. Dexamethasone : This is a non-particulate steroid, meaning it has a lower risk of tissue damage compared to particulate steroids like triamcinolone and methylprednisolone. While less common for this specific  use,  it is an option for targeted injections.   Considerations for physicians Particulate vs. non-particulate steroids: Triamcinolone and methylprednisolone are particulate, meaning they can clump together. Dexamethasone  is non-particulate. Particulate steroids are often preferred for their longer-lasting effects but carry a theoretical higher risk for certain injections (though this is less of a concern in the costochondral joints). Combined injectate: Corticosteroids are typically mixed with a local anesthetic like lidocaine  to provide both immediate pain relief (from the anesthetic) and longer-term inflammation reduction (from the steroid). Imaging guidance: To ensure accurate placement of the needle and medication, physicians may use ultrasound or fluoroscopic guidance for the injection, especially in complex or refractory cases.   Patient guidance Before undergoing a steroid injection, discuss the options with your physician. They will determine the best steroid, dosage, and procedure for your specific case based on factors like: Severity of your condition History of response to other treatments Your overall health status Experience and preference of the physician  Last  Updated: 09/22/2023 ______________________________________________________________________

## 2023-12-15 ENCOUNTER — Telehealth: Payer: Self-pay

## 2023-12-15 NOTE — Telephone Encounter (Signed)
 Spoke to patient about physical therapy. She has not had PT but she goes to the gym twice a week and works with a systems analyst. She does stretching and strengthening exercises to try and maintain her range of motion,  and she walks every day.

## 2023-12-17 DIAGNOSIS — J3089 Other allergic rhinitis: Secondary | ICD-10-CM | POA: Diagnosis not present

## 2023-12-17 DIAGNOSIS — J3081 Allergic rhinitis due to animal (cat) (dog) hair and dander: Secondary | ICD-10-CM | POA: Diagnosis not present

## 2023-12-17 DIAGNOSIS — J301 Allergic rhinitis due to pollen: Secondary | ICD-10-CM | POA: Diagnosis not present

## 2023-12-21 ENCOUNTER — Ambulatory Visit

## 2023-12-21 ENCOUNTER — Encounter: Payer: Self-pay | Admitting: Internal Medicine

## 2023-12-22 ENCOUNTER — Ambulatory Visit (INDEPENDENT_AMBULATORY_CARE_PROVIDER_SITE_OTHER)

## 2023-12-22 DIAGNOSIS — J301 Allergic rhinitis due to pollen: Secondary | ICD-10-CM | POA: Diagnosis not present

## 2023-12-22 DIAGNOSIS — J3081 Allergic rhinitis due to animal (cat) (dog) hair and dander: Secondary | ICD-10-CM | POA: Diagnosis not present

## 2023-12-22 DIAGNOSIS — J3089 Other allergic rhinitis: Secondary | ICD-10-CM | POA: Diagnosis not present

## 2023-12-22 DIAGNOSIS — E538 Deficiency of other specified B group vitamins: Secondary | ICD-10-CM | POA: Diagnosis not present

## 2023-12-22 MED ORDER — CYANOCOBALAMIN 1000 MCG/ML IJ SOLN
1000.0000 ug | Freq: Once | INTRAMUSCULAR | Status: AC
  Administered 2023-12-22: 1000 ug via INTRAMUSCULAR

## 2023-12-22 NOTE — Progress Notes (Signed)
 Pt received B12 injection Left  deltoid  muscle. Pt tolerated it well with no complaints or concerns.

## 2023-12-29 DIAGNOSIS — J3081 Allergic rhinitis due to animal (cat) (dog) hair and dander: Secondary | ICD-10-CM | POA: Diagnosis not present

## 2023-12-29 DIAGNOSIS — J3089 Other allergic rhinitis: Secondary | ICD-10-CM | POA: Diagnosis not present

## 2023-12-29 DIAGNOSIS — J301 Allergic rhinitis due to pollen: Secondary | ICD-10-CM | POA: Diagnosis not present

## 2024-01-05 DIAGNOSIS — J3089 Other allergic rhinitis: Secondary | ICD-10-CM | POA: Diagnosis not present

## 2024-01-05 DIAGNOSIS — J301 Allergic rhinitis due to pollen: Secondary | ICD-10-CM | POA: Diagnosis not present

## 2024-01-05 DIAGNOSIS — J3081 Allergic rhinitis due to animal (cat) (dog) hair and dander: Secondary | ICD-10-CM | POA: Diagnosis not present

## 2024-01-14 ENCOUNTER — Telehealth: Admitting: Internal Medicine

## 2024-01-14 VITALS — Ht 62.0 in | Wt 120.0 lb

## 2024-01-14 DIAGNOSIS — E785 Hyperlipidemia, unspecified: Secondary | ICD-10-CM | POA: Diagnosis not present

## 2024-01-14 DIAGNOSIS — K219 Gastro-esophageal reflux disease without esophagitis: Secondary | ICD-10-CM | POA: Diagnosis not present

## 2024-01-14 DIAGNOSIS — I2510 Atherosclerotic heart disease of native coronary artery without angina pectoris: Secondary | ICD-10-CM | POA: Diagnosis not present

## 2024-01-14 DIAGNOSIS — G4730 Sleep apnea, unspecified: Secondary | ICD-10-CM

## 2024-01-14 DIAGNOSIS — F419 Anxiety disorder, unspecified: Secondary | ICD-10-CM | POA: Diagnosis not present

## 2024-01-14 DIAGNOSIS — E0590 Thyrotoxicosis, unspecified without thyrotoxic crisis or storm: Secondary | ICD-10-CM

## 2024-01-14 DIAGNOSIS — I5181 Takotsubo syndrome: Secondary | ICD-10-CM | POA: Diagnosis not present

## 2024-01-14 NOTE — Progress Notes (Signed)
 Patient ID: Shannon [name], female   DOB: 25-Nov-1965, 58 y.o.   MRN: [id number]   Virtual Visit via video Note  I connected with Rudolph Gallus by a video enabled telemedicine application and verified that I am speaking with the correct person using two identifiers. Location patient: home Location provider: work  Persons participating in the virtual visit: patient, provider  The limitations, risks, security and privacy concerns of performing an evaluation and management service by video and the availability of in person appointments have bene discusse.d it has also been. discussed with the patient that there may be a patient responsible charge related to this service. The patient expressed understanding and agreed to proceed.   Reason for visit: work in appt.   HPI: Recently was having problems with joint pain. Advised trial off crestor . Stopped crestor  12/22/23. No significant change off crestor . Previously felt some better when stopping metoprolol , but since stopping crestor  - unable to notice a significant change. Has been followed by pain clinic. S/p injection - hip. Did help. Discussed increased anxiety. Breathing stable. No abdominal pain or bowel change reported.    ROS: See pertinent positives and negatives per HPI.  Past Medical History:  Diagnosis Date   Anemia    Anxiety    Breast pain, right 11/07/2012   Breathing difficulty 06/20/2014   Environmental allergies    GERD (gastroesophageal reflux disease)    Hiatal hernia    small   History of migraine headaches    Hyperthyroidism    s/p ablation   Vaginitis 01/16/2013    Past Surgical History:  Procedure Laterality Date   BREAST BIOPSY Right 12/05/2015   neg   CESAREAN SECTION  11/1996   Dr Anette Molt   CHOLECYSTECTOMY  11/2003   Dr Lorrene   JOINT REPLACEMENT Left 2017   hip   LEFT HEART CATH AND CORONARY ANGIOGRAPHY N/A 11/25/2022   Procedure: LEFT HEART CATH AND CORONARY ANGIOGRAPHY;  Surgeon: Mady Bruckner,  MD;  Location: [facility] INV[facility]IVE CV [facility];  Service: Cardiovascular;  Laterality: N/A;   NASAL SINUS SURGERY  11/1991   SEPTOPLASTY  05/1988   Dr Maceo   TOOTH EXTRACTION     TUBAL LIGATION  02/1998   UMBILICAL HERNIA REPAIR  02/1998    Family History  Problem Relation Age of Onset   Heart disease Father    Hypertension Father    Thyroid  disease Father    Hypercholesterolemia Father    Kidney cancer Father    Leukemia Father        hairy cell   Hypercholesterolemia Mother    Thyroid  disease Mother    Breast cancer Mother 39   Breast cancer Other        maternal great grandmother   Heart disease Maternal Grandfather        MI - age 59   Prostate cancer Paternal Grandfather    Pancreatic cancer Paternal Grandfather    Lung cancer Paternal Grandfather    Bladder Cancer Neg Hx     SOCIAL HX: reviewed.   Current Medications[1]  EXAM:  GENERAL: alert, oriented, appears well and in no acute distress  HEENT: atraumatic, conjunttiva clear, no obvious abnormalities on inspection of external nose and ears  NECK: normal movements of the head and neck  LUNGS: on inspection no signs of respiratory distress, breathing rate appears normal, no obvious gross SOB, gasping or wheezing  CV: no obvious cyanosis  PSYCH/NEURO: pleasant and cooperative, no obvious depression or anxiety, speech and thought  processing grossly intact  ASSESSMENT AND PLAN:  Discussed the following assessment and plan:  Problem List Items Addressed This Visit     Stress-induced cardiomyopathy - Primary   Continue aspirin .  Did not tolerate jardiance  or lipitor. ECHO - 05/07/23 - EF 60-65% with no significant valve abnormality. Trying to stay active. No chest pain. Breathing stable. Did feel better after stopping metoprolol . Continue cpap. Continue f/u with cardiology. Could not tell a significant difference after stopping crestor . Discussed cholesterol treatment, restarting crestor . Follow.       Sleep apnea    Continue cpap.       Nonobstructive atherosclerosis of coronary artery   Cardiac cath 11/25/22 - Mild to moderate, nonobstructive coronary artery disease with 30-40% mid LAD stenosis.  No significant disease noted in the LMCA, LCx, or RCA.. Mildly reduced left ventricular systolic function with mid left ventricular wall motion abnormality most consistent with a Takotsubo variant. Normal left ventricular filling pressure. Recent echo improved as outlined.  Continues on aspirin . Was on jardiance . Off now due to low blood sugars.  Also off atorvastatin  and off toprol  12.5mg . felt better after stopping metoprolol . No significant difference stopping crestor . ECHO - 05/07/23 - EF 60-65% with no significant valve abnormality. No chest pain or sob reported. Trying to stay active.       Hyperthyroidism   S/p ablation. TSH wnl 10/27/23. Follow tsh.       Hyperlipidemia LDL goal <70   Cannot tell a significant difference off crestor . Discussed risk factor reduction with crestor . Low dose.       GERD (gastroesophageal reflux disease)   Continue protonix .       Anxiety   Increased stress and some anxiety. Discussed. Notify if feels needs further intervention. Follow.        Return for keep scheduled.   I discussed the assessment and treatment plan with the patient. The patient was provided an opportunity to ask questions and all were answered. The patient agreed with the plan and demonstrated an understanding of the instructions.   The patient was advised to call back or seek an in-person evaluation if the symptoms worsen or if the condition fails to improve as anticipated.    Allena Hamilton, MD       [1]  Current Outpatient Medications:    acetaminophen  (TYLENOL ) 500 MG tablet, Take 1,000 mg by mouth every 6 (six) hours as needed for moderate pain (pain score 4-6) or mild pain (pain score 1-3)., Disp: , Rfl:    albuterol (VENTOLIN HFA) 108 (90 Base) MCG/ACT inhaler, Inhale 1 puff into  the lungs every 6 (six) hours as needed., Disp: , Rfl:    ALPRAZolam  (XANAX ) 0.25 MG tablet, TAKE ONE TABLET BY MOUTH ONCE DAILY AS NEEDED FOR ANXIETY, Disp: 20 tablet, Rfl: 0   aspirin  EC 81 MG tablet, Take 1 tablet (81 mg total) by mouth daily. Swallow whole., Disp: 30 tablet, Rfl: 12   cholecalciferol (VITAMIN D3) 25 MCG (1000 UNIT) tablet, Take 1,000 Units by mouth daily., Disp: , Rfl:    Cyanocobalamin  1000 MCG/ML KIT, Inject 1,000 mcg as directed every 30 (thirty) days., Disp: , Rfl:    EPINEPHrine 0.3 mg/0.3 mL IJ SOAJ injection, Inject 0.3 mg into the muscle as needed for anaphylaxis., Disp: , Rfl:    nitroGLYCERIN  (NITROSTAT ) 0.4 MG SL tablet, Place 1 tablet (0.4 mg total) under the tongue every 5 (five) minutes as needed for chest pain., Disp: 25 tablet, Rfl: 3   pantoprazole  (PROTONIX ) 40  MG tablet, Take 40 mg by mouth 2 (two) times daily., Disp: , Rfl:    predniSONE  (STERAPRED UNI-PAK 21 TAB) 10 MG (21) TBPK tablet, Take 10 mg by mouth daily., Disp: , Rfl:    UBRELVY 100 MG TABS, Take by mouth., Disp: , Rfl:    rosuvastatin  (CRESTOR ) 5 MG tablet, Take one tablet every other day. (Patient not taking: Reported on 01/14/2024), Disp: 45 tablet, Rfl: 1

## 2024-01-22 ENCOUNTER — Encounter: Payer: Self-pay | Admitting: Internal Medicine

## 2024-01-22 NOTE — Assessment & Plan Note (Signed)
 S/p ablation. TSH wnl 10/27/23. Follow tsh.

## 2024-01-22 NOTE — Assessment & Plan Note (Signed)
 Continue protonix

## 2024-01-22 NOTE — Assessment & Plan Note (Signed)
 Increased stress and some anxiety. Discussed. Notify if feels needs further intervention. Follow.

## 2024-01-22 NOTE — Assessment & Plan Note (Signed)
 Cardiac cath 11/25/22 - Mild to moderate, nonobstructive coronary artery disease with 30-40% mid LAD stenosis.  No significant disease noted in the LMCA, LCx, or RCA.. Mildly reduced left ventricular systolic function with mid left ventricular wall motion abnormality most consistent with a Takotsubo variant. Normal left ventricular filling pressure. Recent echo improved as outlined.  Continues on aspirin . Was on jardiance . Off now due to low blood sugars.  Also off atorvastatin  and off toprol  12.5mg . felt better after stopping metoprolol . No significant difference stopping crestor . ECHO - 05/07/23 - EF 60-65% with no significant valve abnormality. No chest pain or sob reported. Trying to stay active.

## 2024-01-22 NOTE — Assessment & Plan Note (Signed)
 Cannot tell a significant difference off crestor . Discussed risk factor reduction with crestor . Low dose.

## 2024-01-22 NOTE — Assessment & Plan Note (Signed)
 Continue cpap.

## 2024-01-22 NOTE — Assessment & Plan Note (Signed)
 Continue aspirin .  Did not tolerate jardiance  or lipitor. ECHO - 05/07/23 - EF 60-65% with no significant valve abnormality. Trying to stay active. No chest pain. Breathing stable. Did feel better after stopping metoprolol . Continue cpap. Continue f/u with cardiology. Could not tell a significant difference after stopping crestor . Discussed cholesterol treatment, restarting crestor . Follow.

## 2024-01-25 ENCOUNTER — Ambulatory Visit

## 2024-01-25 DIAGNOSIS — E538 Deficiency of other specified B group vitamins: Secondary | ICD-10-CM

## 2024-01-25 MED ORDER — CYANOCOBALAMIN 1000 MCG/ML IJ SOLN
1000.0000 ug | Freq: Once | INTRAMUSCULAR | Status: AC
  Administered 2024-01-25: 1000 ug via INTRAMUSCULAR

## 2024-01-25 NOTE — Progress Notes (Signed)
 Patient was administered a B12 injection into her right deltoid. Patient tolerated the B12 injection well.

## 2024-02-09 ENCOUNTER — Other Ambulatory Visit (INDEPENDENT_AMBULATORY_CARE_PROVIDER_SITE_OTHER)

## 2024-02-09 DIAGNOSIS — E0590 Thyrotoxicosis, unspecified without thyrotoxic crisis or storm: Secondary | ICD-10-CM | POA: Diagnosis not present

## 2024-02-09 DIAGNOSIS — E559 Vitamin D deficiency, unspecified: Secondary | ICD-10-CM

## 2024-02-09 DIAGNOSIS — M545 Low back pain, unspecified: Secondary | ICD-10-CM | POA: Diagnosis not present

## 2024-02-09 DIAGNOSIS — E538 Deficiency of other specified B group vitamins: Secondary | ICD-10-CM

## 2024-02-09 DIAGNOSIS — Z13220 Encounter for screening for lipoid disorders: Secondary | ICD-10-CM

## 2024-02-09 DIAGNOSIS — M47817 Spondylosis without myelopathy or radiculopathy, lumbosacral region: Secondary | ICD-10-CM

## 2024-02-09 DIAGNOSIS — R5383 Other fatigue: Secondary | ICD-10-CM | POA: Diagnosis not present

## 2024-02-09 DIAGNOSIS — M47816 Spondylosis without myelopathy or radiculopathy, lumbar region: Secondary | ICD-10-CM

## 2024-02-09 DIAGNOSIS — G8929 Other chronic pain: Secondary | ICD-10-CM | POA: Diagnosis not present

## 2024-02-09 LAB — HEPATIC FUNCTION PANEL
ALT: 16 U/L (ref 3–35)
AST: 18 U/L (ref 5–37)
Albumin: 4 g/dL (ref 3.5–5.2)
Alkaline Phosphatase: 51 U/L (ref 39–117)
Bilirubin, Direct: 0.1 mg/dL (ref 0.1–0.3)
Total Bilirubin: 0.4 mg/dL (ref 0.2–1.2)
Total Protein: 6.2 g/dL (ref 6.0–8.3)

## 2024-02-09 LAB — BASIC METABOLIC PANEL WITH GFR
BUN: 12 mg/dL (ref 6–23)
CO2: 28 meq/L (ref 19–32)
Calcium: 9.3 mg/dL (ref 8.4–10.5)
Chloride: 107 meq/L (ref 96–112)
Creatinine, Ser: 0.62 mg/dL (ref 0.40–1.20)
GFR: 96.8 mL/min
Glucose, Bld: 85 mg/dL (ref 70–99)
Potassium: 4.1 meq/L (ref 3.5–5.1)
Sodium: 143 meq/L (ref 135–145)

## 2024-02-09 LAB — LIPID PANEL
Cholesterol: 208 mg/dL — ABNORMAL HIGH (ref 28–200)
HDL: 68 mg/dL
LDL Cholesterol: 116 mg/dL — ABNORMAL HIGH (ref 10–99)
NonHDL: 140.17
Total CHOL/HDL Ratio: 3
Triglycerides: 119 mg/dL (ref 10.0–149.0)
VLDL: 23.8 mg/dL (ref 0.0–40.0)

## 2024-02-09 LAB — VITAMIN D 25 HYDROXY (VIT D DEFICIENCY, FRACTURES): VITD: 26.51 ng/mL — ABNORMAL LOW (ref 30.00–100.00)

## 2024-02-09 LAB — VITAMIN B12: Vitamin B-12: 602 pg/mL (ref 211–911)

## 2024-02-10 ENCOUNTER — Ambulatory Visit: Payer: Self-pay | Admitting: Internal Medicine

## 2024-02-11 ENCOUNTER — Ambulatory Visit: Admitting: Internal Medicine

## 2024-02-11 ENCOUNTER — Encounter: Payer: Self-pay | Admitting: Internal Medicine

## 2024-02-11 VITALS — BP 120/70 | HR 80 | Temp 98.5°F | Ht 62.0 in | Wt 124.6 lb

## 2024-02-11 DIAGNOSIS — E559 Vitamin D deficiency, unspecified: Secondary | ICD-10-CM | POA: Diagnosis not present

## 2024-02-11 DIAGNOSIS — R0789 Other chest pain: Secondary | ICD-10-CM

## 2024-02-11 DIAGNOSIS — K219 Gastro-esophageal reflux disease without esophagitis: Secondary | ICD-10-CM

## 2024-02-11 DIAGNOSIS — E041 Nontoxic single thyroid nodule: Secondary | ICD-10-CM

## 2024-02-11 DIAGNOSIS — I5181 Takotsubo syndrome: Secondary | ICD-10-CM | POA: Diagnosis not present

## 2024-02-11 DIAGNOSIS — E0590 Thyrotoxicosis, unspecified without thyrotoxic crisis or storm: Secondary | ICD-10-CM | POA: Diagnosis not present

## 2024-02-11 DIAGNOSIS — E785 Hyperlipidemia, unspecified: Secondary | ICD-10-CM | POA: Diagnosis not present

## 2024-02-11 DIAGNOSIS — F419 Anxiety disorder, unspecified: Secondary | ICD-10-CM | POA: Diagnosis not present

## 2024-02-11 DIAGNOSIS — I2510 Atherosclerotic heart disease of native coronary artery without angina pectoris: Secondary | ICD-10-CM

## 2024-02-11 DIAGNOSIS — G4730 Sleep apnea, unspecified: Secondary | ICD-10-CM | POA: Diagnosis not present

## 2024-02-11 NOTE — Progress Notes (Signed)
 "  Subjective:    Patient ID: Shannon [name], female    DOB: 16-Oct-1965, 59 y.o.   MRN: [id number]  Patient here for  Chief Complaint  Patient presents with   Medical Management of Chronic Issues    HPI Here for a scheduled follow up - follow up regarding stress induced cardiomyopathy, hypercholesterolemia and sleep apnea. Did not tolerate jardiance  or lipitor. ECHO - 05/07/23 - EF 60-65% with no significant valve abnormality. Felt better after stopping metoprolol . Continues cpap. Could not tell a significant difference after stopping crestor . Discussed restarting. Reports that starting a couple of days ago, noticed increased burping, increased acid. Has taken gas x and protonix . Did start taking vitamin C last week. With these symptoms, she has stopped. Some increased gas. She tries to stay active. Walks 8,000 - 10,000 steps per day. Denies any chest pain or sob with increased activity or exertion. Still with joint pains - elbows, knees and hands. Placed on oral steroids - felt better. Does not feel crestor  aggravated.    Past Medical History:  Diagnosis Date   Anemia    Anxiety    Breast pain, right 11/07/2012   Breathing difficulty 06/20/2014   Environmental allergies    GERD (gastroesophageal reflux disease)    Hiatal hernia    small   History of migraine headaches    Hyperthyroidism    s/p ablation   Vaginitis 01/16/2013   Past Surgical History:  Procedure Laterality Date   BREAST BIOPSY Right 12/05/2015   neg   CESAREAN SECTION  11/1996   Dr Anette Molt   CHOLECYSTECTOMY  11/2003   Dr Lorrene   JOINT REPLACEMENT Left 2017   hip   LEFT HEART CATH AND CORONARY ANGIOGRAPHY N/A 11/25/2022   Procedure: LEFT HEART CATH AND CORONARY ANGIOGRAPHY;  Surgeon: Mady Bruckner, MD;  Location: [facility];  Service: Cardiovascular;  Laterality: N/A;   NASAL SINUS SURGERY  11/1991   SEPTOPLASTY  05/1988   Dr Maceo   TOOTH EXTRACTION     TUBAL LIGATION  02/1998   UMBILICAL  HERNIA REPAIR  02/1998   Family History  Problem Relation Age of Onset   Heart disease Father    Hypertension Father    Thyroid  disease Father    Hypercholesterolemia Father    Kidney cancer Father    Leukemia Father        hairy cell   Hypercholesterolemia Mother    Thyroid  disease Mother    Breast cancer Mother 63   Breast cancer Other        maternal great grandmother   Heart disease Maternal Grandfather        MI - age 31   Prostate cancer Paternal Grandfather    Pancreatic cancer Paternal Grandfather    Lung cancer Paternal Grandfather    Bladder Cancer Neg Hx    Social History   Socioeconomic History   Marital status: Married    Spouse name: Not on file   Number of children: 1   Years of education: Not on file   Highest education level: 12th grade  Occupational History   Not on file  Tobacco Use   Smoking status: Former    Current packs/day: 0.00    Types: Cigarettes    Quit date: 01/28/2000    Years since quitting: 24.0   Smokeless tobacco: Never  Substance and Sexual Activity   Alcohol use: No    Alcohol/week: 0.0 standard drinks of alcohol   Drug use:  No   Sexual activity: Not Currently    Partners: Male  Other Topics Concern   Not on file  Social History Narrative   Not on file   Social Drivers of Health   Tobacco Use: Medium Risk (02/14/2024)   Patient History    Smoking Tobacco Use: Former    Smokeless Tobacco Use: Never    Passive Exposure: Not on file  Financial Resource Strain: Low Risk (01/14/2024)   Overall Financial Resource Strain (CARDIA)    Difficulty of Paying Living Expenses: Not hard at all  Food Insecurity: No Food Insecurity (01/14/2024)   Epic    Worried About Radiation Protection Practitioner of Food in the Last Year: Never true    Ran Out of Food in the Last Year: Never true  Transportation Needs: No Transportation Needs (01/14/2024)   Epic    Lack of Transportation (Medical): No    Lack of Transportation (Non-Medical): No  Physical Activity:  Sufficiently Active (01/14/2024)   Exercise Vital Sign    Days of Exercise per Week: 5 days    Minutes of Exercise per Session: 40 min  Stress: Stress Concern Present (01/14/2024)   [employer] of Occupational Health - Occupational Stress Questionnaire    Feeling of Stress: Rather much  Social Connections: Unknown (01/14/2024)   Social Connection and Isolation Panel    Frequency of Communication with Friends and Family: Not on file    Frequency of Social Gatherings with Friends and Family: Not on file    Attends Religious Services: Not on file    Active Member of Clubs or Organizations: Yes    Attends Banker Meetings: Not on file    Marital Status: Not on file  Depression (PHQ2-9): Low Risk (02/11/2024)   Depression (PHQ2-9)    PHQ-2 Score: 2  Alcohol Screen: Not on file  Housing: Low Risk (03/17/2023)   Housing Stability Vital Sign    Unable to Pay for Housing in the Last Year: No    Number of Times Moved in the Last Year: 0    Homeless in the Last Year: No  Utilities: Not At Risk (02/18/2023)   Received from [facility]    Threatened with loss of utilities: No  Health Literacy: Not on file     Review of Systems  Constitutional:  Negative for appetite change and unexpected weight change.  HENT:  Negative for congestion and sinus pressure.   Respiratory:  Negative for cough and shortness of breath.        Chest discomfort as outlined.   Cardiovascular:  Negative for palpitations and leg swelling.  Gastrointestinal:  Negative for vomiting.       Increased burping and gas as outlined. Increased acid reflux.   Genitourinary:  Negative for difficulty urinating and dysuria.  Musculoskeletal:  Negative for myalgias.       Joint pain as outlined.   Skin:  Negative for color change and rash.  Neurological:  Negative for dizziness and headaches.  Psychiatric/Behavioral:  Negative for agitation and dysphoric mood.         Objective:     BP 120/70   Pulse 80   Temp 98.5 F (36.9 C) (Oral)   Ht 5' 2 (1.575 m)   Wt 124 lb 9.6 oz (56.5 kg)   LMP 02/12/2013   SpO2 98%   BMI 22.79 kg/m  Wt Readings from Last 3 Encounters:  02/11/24 124 lb 9.6 oz (56.5 kg)  01/14/24 120  lb (54.4 kg)  12/14/23 120 lb (54.4 kg)    Physical Exam Vitals reviewed.  Constitutional:      General: She is not in acute distress.    Appearance: Normal appearance.  HENT:     Head: Normocephalic and atraumatic.     Right Ear: External ear normal.     Left Ear: External ear normal.     Mouth/Throat:     Pharynx: No oropharyngeal exudate or posterior oropharyngeal erythema.  Eyes:     General: No scleral icterus.       Right eye: No discharge.        Left eye: No discharge.     Conjunctiva/sclera: Conjunctivae normal.  Neck:     Thyroid : No thyromegaly.  Cardiovascular:     Rate and Rhythm: Normal rate and regular rhythm.  Pulmonary:     Effort: No respiratory distress.     Breath sounds: Normal breath sounds. No wheezing.  Abdominal:     General: Bowel sounds are normal.     Palpations: Abdomen is soft.     Tenderness: There is no abdominal tenderness.  Musculoskeletal:        General: No swelling or tenderness.     Cervical back: Neck supple. No tenderness.  Lymphadenopathy:     Cervical: No cervical adenopathy.  Skin:    Findings: No erythema or rash.  Neurological:     Mental Status: She is alert.  Psychiatric:        Mood and Affect: Mood normal.        Behavior: Behavior normal.         Outpatient Encounter Medications as of 02/11/2024  Medication Sig   acetaminophen  (TYLENOL ) 500 MG tablet Take 1,000 mg by mouth every 6 (six) hours as needed for moderate pain (pain score 4-6) or mild pain (pain score 1-3).   albuterol (VENTOLIN HFA) 108 (90 Base) MCG/ACT inhaler Inhale 1 puff into the lungs every 6 (six) hours as needed.   ALPRAZolam  (XANAX ) 0.25 MG tablet TAKE ONE TABLET BY MOUTH ONCE DAILY AS  NEEDED FOR ANXIETY   aspirin  EC 81 MG tablet Take 1 tablet (81 mg total) by mouth daily. Swallow whole.   cholecalciferol (VITAMIN D3) 25 MCG (1000 UNIT) tablet Take 1,000 Units by mouth daily.   Cyanocobalamin  1000 MCG/ML KIT Inject 1,000 mcg as directed every 30 (thirty) days.   EPINEPHrine 0.3 mg/0.3 mL IJ SOAJ injection Inject 0.3 mg into the muscle as needed for anaphylaxis.   nitroGLYCERIN  (NITROSTAT ) 0.4 MG SL tablet Place 1 tablet (0.4 mg total) under the tongue every 5 (five) minutes as needed for chest pain.   pantoprazole  (PROTONIX ) 40 MG tablet Take 40 mg by mouth 2 (two) times daily.   predniSONE  (STERAPRED UNI-PAK 21 TAB) 10 MG (21) TBPK tablet Take 10 mg by mouth daily.   UBRELVY 100 MG TABS Take by mouth.   rosuvastatin  (CRESTOR ) 5 MG tablet Take one tablet every other day. (Patient not taking: Reported on 02/11/2024)   No facility-administered encounter medications on file as of 02/11/2024.     Lab Results  Component Value Date   WBC 6.8 07/13/2023   HGB 13.8 07/13/2023   HCT 41.2 07/13/2023   PLT 277.0 07/13/2023   GLUCOSE 85 02/09/2024   CHOL 208 (H) 02/09/2024   TRIG 119.0 02/09/2024   HDL 68.00 02/09/2024   LDLCALC 116 (H) 02/09/2024   ALT 16 02/09/2024   AST 18 02/09/2024   NA 143 02/09/2024  K 4.1 02/09/2024   CL 107 02/09/2024   CREATININE 0.62 02/09/2024   BUN 12 02/09/2024   CO2 28 02/09/2024   TSH 2.07 10/27/2023   INR 1.0 11/24/2022    DG PAIN CLINIC C-ARM 1-60 MIN NO REPORT Result Date: 11/26/2023 Fluoro was used, but no Radiologist interpretation will be provided. Please refer to NOTES tab for provider progress note.      Assessment & Plan:  Chest discomfort -     EKG 12-Lead  Vitamin D  insufficiency Assessment & Plan: Vitamin D  level - low. Discussed vitamin D  supplements.    Thyroid  nodule Assessment & Plan: S/p biopsy. Ok. Continue f/u with endocrinology.     Stress-induced cardiomyopathy Assessment & Plan: Continue aspirin .   Did not tolerate jardiance  or lipitor. ECHO - 05/07/23 - EF 60-65% with no significant valve abnormality. Trying to stay active. Did feel better after stopping metoprolol . Continue cpap. Continue f/u with cardiology. Could not tell a significant difference after stopping crestor . Discussed cholesterol treatment, restarting crestor . She reports today the chest/upper abdominal discomfort as outlined. Given initial presentation, EKG obtained. EKG - SR with no acute ischemic changes. Will stop vitamin C. Protonix  scheduled - pepcid. D/w cardiology regarding f/u and question of need for further testing. Call with update. If any change or worsening symptoms (or persistent), she is to be evaluated.    Sleep apnea, unspecified type Assessment & Plan: Continue cpap.    Nonobstructive atherosclerosis of coronary artery Assessment & Plan: Cardiac cath 11/25/22 - Mild to moderate, nonobstructive coronary artery disease with 30-40% mid LAD stenosis.  No significant disease noted in the LMCA, LCx, or RCA.. Mildly reduced left ventricular systolic function with mid left ventricular wall motion abnormality most consistent with a Takotsubo variant. Normal left ventricular filling pressure. Recent echo improved as outlined.  Continues on aspirin . Was on jardiance . Off now due to low blood sugars.  Also off atorvastatin  and off toprol  12.5mg . felt better after stopping metoprolol . No significant difference stopping crestor . ECHO - 05/07/23 - EF 60-65% with no significant valve abnormality. Chest/abdominal discomfort as outlined. No chest pain with increased activity or exertion. Given initial presentation, EKG obtained. EKG - SR with no acute ischemic changes. Sees Dr End. Contact for earlier follow up. Stop vitamin C. Scheduled PPI/pepcid.    Hyperthyroidism Assessment & Plan: S/p ablation. TSH wnl 10/27/23. Follow tsh.    Hyperlipidemia LDL goal <70 Assessment & Plan: Cannot tell a significant difference off  crestor . Discussed risk factor reduction with crestor . Restart crestor . Will restart low dose and titrate up.    Gastroesophageal reflux disease, unspecified whether esophagitis present Assessment & Plan: Acid reflux and discomfort as outlined. Increased gas and burping. PPI/pepcid scheduled. Follow.    Anxiety Assessment & Plan: Overall appears to be relatively stable. Follow.    I spent 45 minutes with the patient.  Time spent discussing her current concerns and symptoms. Specifically time spent discussing her current symptoms and further w/up.  Time also spent discussing further w/up, evaluation and treatment.     Allena Hamilton, MD "

## 2024-02-12 ENCOUNTER — Ambulatory Visit: Payer: Self-pay | Admitting: Internal Medicine

## 2024-02-14 ENCOUNTER — Encounter: Payer: Self-pay | Admitting: Internal Medicine

## 2024-02-14 NOTE — Assessment & Plan Note (Signed)
 Overall appears to be relatively stable. Follow.

## 2024-02-14 NOTE — Assessment & Plan Note (Signed)
 Continue aspirin .  Did not tolerate jardiance  or lipitor. ECHO - 05/07/23 - EF 60-65% with no significant valve abnormality. Trying to stay active. Did feel better after stopping metoprolol . Continue cpap. Continue f/u with cardiology. Could not tell a significant difference after stopping crestor . Discussed cholesterol treatment, restarting crestor . She reports today the chest/upper abdominal discomfort as outlined. Given initial presentation, EKG obtained. EKG - SR with no acute ischemic changes. Will stop vitamin C. Protonix  scheduled - pepcid. D/w cardiology regarding f/u and question of need for further testing. Call with update. If any change or worsening symptoms (or persistent), she is to be evaluated.

## 2024-02-14 NOTE — Assessment & Plan Note (Signed)
 Acid reflux and discomfort as outlined. Increased gas and burping. PPI/pepcid scheduled. Follow.

## 2024-02-14 NOTE — Assessment & Plan Note (Signed)
 Cardiac cath 11/25/22 - Mild to moderate, nonobstructive coronary artery disease with 30-40% mid LAD stenosis.  No significant disease noted in the LMCA, LCx, or RCA.. Mildly reduced left ventricular systolic function with mid left ventricular wall motion abnormality most consistent with a Takotsubo variant. Normal left ventricular filling pressure. Recent echo improved as outlined.  Continues on aspirin . Was on jardiance . Off now due to low blood sugars.  Also off atorvastatin  and off toprol  12.5mg . felt better after stopping metoprolol . No significant difference stopping crestor . ECHO - 05/07/23 - EF 60-65% with no significant valve abnormality. Chest/abdominal discomfort as outlined. No chest pain with increased activity or exertion. Given initial presentation, EKG obtained. EKG - SR with no acute ischemic changes. Sees Dr End. Contact for earlier follow up. Stop vitamin C. Scheduled PPI/pepcid.

## 2024-02-14 NOTE — Assessment & Plan Note (Signed)
 S/p biopsy. Ok. Continue f/u with endocrinology.

## 2024-02-14 NOTE — Assessment & Plan Note (Addendum)
 Cannot tell a significant difference off crestor . Discussed risk factor reduction with crestor . Restart crestor . Will restart low dose and titrate up.

## 2024-02-14 NOTE — Assessment & Plan Note (Signed)
 Vitamin D  level - low. Discussed vitamin D  supplements.

## 2024-02-14 NOTE — Assessment & Plan Note (Signed)
 S/p ablation. TSH wnl 10/27/23. Follow tsh.

## 2024-02-14 NOTE — Assessment & Plan Note (Signed)
 Continue cpap

## 2024-02-16 ENCOUNTER — Telehealth: Payer: Self-pay | Admitting: Internal Medicine

## 2024-02-16 NOTE — Telephone Encounter (Signed)
-----   Message from Lonni Hanson, MD sent at 02/15/2024  6:50 AM EST ----- Regarding: RE: update and follow up Sure, we'll try to get her in to see us  sooner.  Thanks.  [place] ----- Message ----- From: Glendia Shad, MD Sent: 02/14/2024   5:48 PM EST To: Lonni Hanson, MD Subject: update and follow up                           Sorry to bother you. Wanted to update you regarding a recent visit that I had with Shannon [name]. She had a regular scheduled follow up appt and came in with two days of upper abdominal and chest discomfort. Some increased acid reflux, burping and gas. She has a history of non STEMI and stress induced cardiomyopathy. Her initial presentation was upper abdominal pain and chest pain. Her EKG in office was reassuring. Discussed possible etiologies with her. She has been staying active walking 8-10,00 steps per day. No chest pain with increased exertion. She stopped the vitamin C and we are treating her acid reflux. She is scheduled a f/u with you 03/2024. Wanted to see if you could see her prior to 03/2024. Would feel better just having you evaluate and see if any further cardiac testing is warranted.  Thank you for seeing her and for your help. I appreciate it.    Shannon [name]

## 2024-02-16 NOTE — Telephone Encounter (Signed)
 Given the improvement in the vitamin D  level, I want her to continue vitamin D3 2000 units per day.

## 2024-02-16 NOTE — Telephone Encounter (Signed)
 PT NOTIFIED

## 2024-02-16 NOTE — Telephone Encounter (Signed)
 Pleasecall and see how she is doing. See if feeling better. Please notify Ms Alkins that I reached out to Dr End. They are going to try and get earlier appt for her.

## 2024-02-24 ENCOUNTER — Encounter: Payer: Self-pay | Admitting: Internal Medicine

## 2024-02-24 ENCOUNTER — Ambulatory Visit: Attending: Internal Medicine | Admitting: Internal Medicine

## 2024-02-24 VITALS — BP 116/70 | HR 65 | Ht 62.0 in | Wt 119.0 lb

## 2024-02-24 DIAGNOSIS — E785 Hyperlipidemia, unspecified: Secondary | ICD-10-CM

## 2024-02-24 DIAGNOSIS — I2510 Atherosclerotic heart disease of native coronary artery without angina pectoris: Secondary | ICD-10-CM | POA: Diagnosis not present

## 2024-02-24 DIAGNOSIS — I5181 Takotsubo syndrome: Secondary | ICD-10-CM

## 2024-02-24 DIAGNOSIS — R079 Chest pain, unspecified: Secondary | ICD-10-CM | POA: Diagnosis not present

## 2024-02-24 NOTE — Patient Instructions (Signed)

## 2024-02-24 NOTE — Progress Notes (Signed)
 " Cardiology Office Note:  .   Date:  02/24/2024  ID:  Shannon [name], DOB [id number], MRN [id number] PCP: Glendia Shad, MD  [facility] Providers Cardiologist:  Lonni Hanson, MD     History of Present Illness: .   Shannon [name] is a 59 y.o. female with history of mild nonobstructive coronary artery disease, stress-induced cardiomyopathy with normalization of LVEF, hyperlipidemia, and GERD, who presents for follow-up of coronary artery disease and stress-induced cardiomyopathy.  I last saw her in 09/2023, at which time she was feeling well without any further episodes of chest pain.  She was tolerating rosuvastatin  5 mg 3 days a week well.  No medication changes or additional testing were pursued.  Shannon [name] reports that she had an episode of chest pain ~2 weeks ago that lasted 3 days.  She was evaluated by Dr. Glendia, with EKG showing sinus rhythm without abnormalities.  Shannon [name] started on famotidine (in addition to her standing PPI) with resolution of the discomfort.  She notes that the chest discomfort was not exertional.  She has been exercising regularly without any issues.  She denies shortness of breath and edema.  She has rare palpitations when she is anxious.  Shannon [name] notes that she self-discontinued rosuvastatin  a few months ago because of joint pains.  However, her joint pains did not improve and she has since restarted rosuvastatin  3x/week after seeing that her LDL was suboptimally controlled on labs earlier this month.  She recently underwent left carpal tunnel injections and still has some numbness and pain in the left thumb nad index finger (alleviated with wrist brace).  ROS: See HPI  Studies Reviewed: .       Risk Assessment/Calculations:             Physical Exam:   VS:  BP 116/70 (BP Location: Left Arm, Patient Position: Sitting, Cuff Size: Normal)   Pulse 65   Ht 5' 2 (1.575 m)   Wt 119 lb (54 kg)   LMP 02/12/2013   SpO2 98%   BMI 21.77 kg/m    Wt  Readings from Last 3 Encounters:  02/24/24 119 lb (54 kg)  02/11/24 124 lb 9.6 oz (56.5 kg)  01/14/24 120 lb (54.4 kg)    General:  NAD. Neck: No JVD or HJR. Lungs: Clear to auscultation bilaterally without wheezes or crackles. Heart: Regular rate and rhythm without murmurs, rubs, or gallops. Abdomen: Soft, nontender, nondistended. Extremities: No lower extremity edema.  ASSESSMENT AND PLAN: .    Coronary artery disease and chest pain: Recent episode of chest pain does not sound cardiac in nature and resolved over the course of 3 days after starting famotidine.  Shannon [name] notes that she has been on corticosteroids shortly before this for treatment of tennis elbow; query if the steroids caused GI symptoms causing her pain.  We will continue with aspirin  and recently restarted rosuvastatin  for secondary prevention.  Hyperlipidemia: Lipid panel earlier this month suboptimal, though Shannon [name] had been off rosuvastatin  due to arthralgias.  As the joint pains did not improve with statin holiday, she has resumed rosuvastatin  5 mg 3x/week.  Dr. Glendia recently recommended titrating this up to 4x/week, which I agree with.  Ultimately, I would favor daily dosing to achieve an LDL < 70, as tolerated.  Stress-induced cardiomyopathy: No  signs or symptoms of heart failure noted.  If her recent episode of atypical chest pain were due to recurrent stress-induced cardiomyopathy, I suspect we would have  seen EKG changes and/or signs of acute heart failure.  Given normalization of LVEF, defer medication changes at this time.    Dispo: Return to clinic in 6 months.  Signed, Lonni Hanson, MD  "

## 2024-02-25 ENCOUNTER — Ambulatory Visit

## 2024-02-25 DIAGNOSIS — E538 Deficiency of other specified B group vitamins: Secondary | ICD-10-CM | POA: Diagnosis not present

## 2024-02-25 MED ORDER — CYANOCOBALAMIN 1000 MCG/ML IJ SOLN
1000.0000 ug | Freq: Once | INTRAMUSCULAR | Status: AC
  Administered 2024-02-25: 1000 ug via INTRAMUSCULAR

## 2024-02-25 NOTE — Progress Notes (Signed)
 Patient presented for B 12 injection to left deltoid, patient voiced no concerns nor showed any signs of distress during injection.

## 2024-02-26 ENCOUNTER — Encounter: Payer: Self-pay | Admitting: Internal Medicine

## 2024-03-28 ENCOUNTER — Ambulatory Visit

## 2024-04-06 ENCOUNTER — Ambulatory Visit: Admitting: Internal Medicine

## 2024-04-11 ENCOUNTER — Ambulatory Visit: Admitting: Internal Medicine
